# Patient Record
Sex: Male | Born: 1945 | Race: White | Hispanic: No | Marital: Married | Smoking: Former smoker
Health system: Southern US, Community
[De-identification: ages and names within clinical notes are randomized; demographics above are authoritative.]

## PROBLEM LIST (undated history)

## (undated) DIAGNOSIS — I10 Essential (primary) hypertension: Secondary | ICD-10-CM

## (undated) DIAGNOSIS — B029 Zoster without complications: Secondary | ICD-10-CM

## (undated) DIAGNOSIS — I255 Ischemic cardiomyopathy: Secondary | ICD-10-CM

## (undated) DIAGNOSIS — M7130 Other bursal cyst, unspecified site: Secondary | ICD-10-CM

## (undated) DIAGNOSIS — E782 Mixed hyperlipidemia: Secondary | ICD-10-CM

## (undated) DIAGNOSIS — Z9861 Coronary angioplasty status: Secondary | ICD-10-CM

## (undated) DIAGNOSIS — I493 Ventricular premature depolarization: Secondary | ICD-10-CM

## (undated) DIAGNOSIS — H35039 Hypertensive retinopathy, unspecified eye: Secondary | ICD-10-CM

## (undated) DIAGNOSIS — N183 Chronic kidney disease, stage 3 unspecified: Secondary | ICD-10-CM

## (undated) DIAGNOSIS — K219 Gastro-esophageal reflux disease without esophagitis: Secondary | ICD-10-CM

## (undated) DIAGNOSIS — I219 Acute myocardial infarction, unspecified: Secondary | ICD-10-CM

## (undated) DIAGNOSIS — E1129 Type 2 diabetes mellitus with other diabetic kidney complication: Secondary | ICD-10-CM

## (undated) DIAGNOSIS — Z951 Presence of aortocoronary bypass graft: Secondary | ICD-10-CM

## (undated) DIAGNOSIS — N400 Enlarged prostate without lower urinary tract symptoms: Secondary | ICD-10-CM

## (undated) DIAGNOSIS — H269 Unspecified cataract: Secondary | ICD-10-CM

## (undated) DIAGNOSIS — E785 Hyperlipidemia, unspecified: Secondary | ICD-10-CM

## (undated) DIAGNOSIS — G8929 Other chronic pain: Secondary | ICD-10-CM

## (undated) DIAGNOSIS — M549 Dorsalgia, unspecified: Secondary | ICD-10-CM

## (undated) DIAGNOSIS — E11319 Type 2 diabetes mellitus with unspecified diabetic retinopathy without macular edema: Secondary | ICD-10-CM

## (undated) DIAGNOSIS — I2510 Atherosclerotic heart disease of native coronary artery without angina pectoris: Secondary | ICD-10-CM

## (undated) DIAGNOSIS — I6522 Occlusion and stenosis of left carotid artery: Secondary | ICD-10-CM

## (undated) HISTORY — DX: Atherosclerotic heart disease of native coronary artery without angina pectoris: Z98.61

## (undated) HISTORY — DX: Hyperlipidemia, unspecified: E78.5

## (undated) HISTORY — DX: Acute myocardial infarction, unspecified: I21.9

## (undated) HISTORY — DX: Chronic kidney disease, stage 3 (moderate): N18.3

## (undated) HISTORY — DX: Hypertensive retinopathy, unspecified eye: H35.039

## (undated) HISTORY — DX: Essential (primary) hypertension: I10

## (undated) HISTORY — PX: LUMBAR FUSION: SHX111

## (undated) HISTORY — DX: Unspecified cataract: H26.9

## (undated) HISTORY — DX: Type 2 diabetes mellitus with other diabetic kidney complication: E11.29

## (undated) HISTORY — DX: Benign prostatic hyperplasia without lower urinary tract symptoms: N40.0

## (undated) HISTORY — DX: Ischemic cardiomyopathy: I25.5

## (undated) HISTORY — DX: Occlusion and stenosis of left carotid artery: I65.22

## (undated) HISTORY — DX: Type 2 diabetes mellitus with unspecified diabetic retinopathy without macular edema: E11.319

## (undated) HISTORY — DX: Chronic kidney disease, stage 3 unspecified: N18.30

## (undated) HISTORY — DX: Other chronic pain: G89.29

## (undated) HISTORY — DX: Other bursal cyst, unspecified site: M71.30

## (undated) HISTORY — DX: Atherosclerotic heart disease of native coronary artery without angina pectoris: I25.10

## (undated) HISTORY — PX: HERNIA REPAIR: SHX51

## (undated) HISTORY — DX: Gastro-esophageal reflux disease without esophagitis: K21.9

## (undated) HISTORY — DX: Dorsalgia, unspecified: M54.9

## (undated) NOTE — *Deleted (*Deleted)
Shared service with APP.  I have personally seen and examined the patient, providing direct face to face care.  Physical exam findings and plan include patient presents with tremors and shakiness.  Patient sent over for further work-up with recent medication changes, kidney disease history.  Concern for electrolyte/kidney/medication related.  Plan for blood work and symptomatic care and likely admission.  Patient has mild shaking all extremities worse in the hands bilateral, mild hyperreflexia patella bilateral.  No diagnosis found.

---

## 1968-10-01 HISTORY — PX: BACK SURGERY: SHX140

## 1992-10-01 HISTORY — PX: CORONARY ANGIOPLASTY: SHX604

## 1995-10-02 HISTORY — PX: CORONARY ANGIOPLASTY: SHX604

## 1997-10-01 HISTORY — PX: CORONARY ANGIOPLASTY WITH STENT PLACEMENT: SHX49

## 1998-08-18 ENCOUNTER — Encounter: Payer: Self-pay | Admitting: Cardiology

## 1998-08-18 ENCOUNTER — Ambulatory Visit (HOSPITAL_COMMUNITY): Admission: RE | Admit: 1998-08-18 | Discharge: 1998-08-18 | Payer: Self-pay | Admitting: Cardiology

## 1999-06-27 ENCOUNTER — Ambulatory Visit (HOSPITAL_COMMUNITY): Admission: RE | Admit: 1999-06-27 | Discharge: 1999-06-27 | Payer: Self-pay | Admitting: Gastroenterology

## 1999-10-02 HISTORY — PX: CORONARY ANGIOPLASTY WITH STENT PLACEMENT: SHX49

## 2000-06-20 ENCOUNTER — Encounter: Admission: RE | Admit: 2000-06-20 | Discharge: 2000-06-20 | Payer: Self-pay | Admitting: Cardiology

## 2000-06-20 ENCOUNTER — Encounter: Payer: Self-pay | Admitting: Cardiology

## 2000-06-24 ENCOUNTER — Ambulatory Visit (HOSPITAL_COMMUNITY): Admission: RE | Admit: 2000-06-24 | Discharge: 2000-06-25 | Payer: Self-pay | Admitting: Cardiology

## 2001-07-09 ENCOUNTER — Encounter: Payer: Self-pay | Admitting: Cardiology

## 2001-07-09 ENCOUNTER — Ambulatory Visit (HOSPITAL_COMMUNITY): Admission: RE | Admit: 2001-07-09 | Discharge: 2001-07-09 | Payer: Self-pay | Admitting: Cardiology

## 2004-10-01 HISTORY — PX: INCISION AND DRAINAGE OF WOUND: SHX1803

## 2005-01-17 ENCOUNTER — Ambulatory Visit (HOSPITAL_COMMUNITY): Admission: RE | Admit: 2005-01-17 | Discharge: 2005-01-17 | Payer: Self-pay

## 2010-10-21 ENCOUNTER — Encounter: Payer: Self-pay | Admitting: Family Medicine

## 2011-10-02 HISTORY — PX: CARDIAC CATHETERIZATION: SHX172

## 2011-10-02 HISTORY — PX: TRANSESOPHAGEAL ECHOCARDIOGRAM: SHX273

## 2011-10-04 ENCOUNTER — Encounter (INDEPENDENT_AMBULATORY_CARE_PROVIDER_SITE_OTHER): Payer: Self-pay | Admitting: Surgery

## 2011-10-04 ENCOUNTER — Ambulatory Visit (INDEPENDENT_AMBULATORY_CARE_PROVIDER_SITE_OTHER): Payer: Medicare Other | Admitting: Surgery

## 2011-10-04 VITALS — BP 158/88 | HR 64 | Temp 98.8°F | Resp 12 | Ht 68.0 in | Wt 166.0 lb

## 2011-10-04 DIAGNOSIS — R229 Localized swelling, mass and lump, unspecified: Secondary | ICD-10-CM

## 2011-10-04 DIAGNOSIS — R2230 Localized swelling, mass and lump, unspecified upper limb: Secondary | ICD-10-CM

## 2011-10-04 DIAGNOSIS — L821 Other seborrheic keratosis: Secondary | ICD-10-CM | POA: Insufficient documentation

## 2011-10-04 DIAGNOSIS — L570 Actinic keratosis: Secondary | ICD-10-CM | POA: Insufficient documentation

## 2011-10-04 NOTE — Patient Instructions (Signed)
Epidermal Cyst An epidermal cyst is sometimes called a sebaceous cyst, epidermal inclusion cyst, or infundibular cyst. These cysts usually contain a substance that looks "pasty" or "cheesy" and may have a bad smell. This substance is a protein called keratin. Epidermal cysts are usually found on the face, neck, or trunk. They may also occur in the vaginal area or other parts of the genitalia of both men and women. Epidermal cysts are usually small, painless, slow-growing bumps or lumps that move freely under the skin. It is important not to try to pop them. This may cause an infection and lead to tenderness and swelling. CAUSES  Epidermal cysts may be caused by a deep penetrating injury to the skin or a plugged hair follicle, often associated with acne. SYMPTOMS  Epidermal cysts can become inflamed and cause:  Redness.   Tenderness.   Increased temperature of the skin over the bumps or lumps.   Grayish-white, bad smelling material that drains from the bump or lump.  DIAGNOSIS  Epidermal cysts are easily diagnosed by your caregiver during an exam. Rarely, a tissue sample (biopsy) may be taken to rule out other conditions that may resemble epidermal cysts. TREATMENT   Epidermal cysts often get better and disappear on their own. They are rarely ever cancerous.   If a cyst becomes infected, it may become inflamed and tender. This may require opening and draining the cyst. Treatment with antibiotics may be necessary. When the infection is gone, the cyst may be removed with minor surgery.   Small, inflamed cysts can often be treated with antibiotics or by injecting steroid medicines.   Sometimes, epidermal cysts become large and bothersome. If this happens, surgical removal in your caregiver's office may be necessary.  HOME CARE INSTRUCTIONS  Only take over-the-counter or prescription medicines as directed by your caregiver.   Take your antibiotics as directed. Finish them even if you start to  feel better.  SEEK MEDICAL CARE IF:   Your cyst becomes tender, red, or swollen.   Your condition is not improving or is getting worse.   You have any other questions or concerns.  MAKE SURE YOU:  Understand these instructions.   Will watch your condition.   Will get help right away if you are not doing well or get worse.  Document Released: 08/18/2004 Document Revised: 05/30/2011 Document Reviewed: 03/26/2011 [facility] Patient Information 2012 [place].  You may have on your scalp an: Actinic Keratosis  Actinic keratosis is a growth on the skin that is considered to be precancerous. That means it could develop into skin cancer if it is not treated. About 1% of such growths will turn into skin cancer, therefore, it is important that the skin growth is removed. An actinic keratosis might be as small as a pinhead or as big as a quarter. They appear most often on areas of skin that get a lot of sun exposure throughout your life. These include the bald scalp, face, ears, lips, upper back and the backs of hands and forearms. An actinic keratosis usually looks like a scaly, rough spot of skin. Sometimes there might be a little tag of pink or gray skin growing off them.  CAUSES  Sun damage causes abnormal growth of skin cells. Actinic keratoses (more than one growth) are the result of sun damage. You are more likely to develop them if you:  Have light-colored skin.   Are older (actinic keratoses increases with age).   Sunburn easily.   Have spent a  lot of time in the sun.   Have had a job that involves a lot of outdoor work, such as a Geographical information systems officer.  SYMPTOMS   Blotchy, red and white patches of skin.   A skin patch that feels rough (like sandpaper), scaly or crusty.   Patches of dry, white skin on the lips.   A patch of skin that is thinner than normal.   Skin that is tender to the touch.   Although a rare symptom; an area of skin that bleeds.  DIAGNOSIS  To decide  if you have actinic keratosis, your caregiver will probably:  Ask about symptoms you have noticed.   Ask about your history of exposure to the sun.   Ask about your overall health history.   Examine the skin that concerns you. The caregiver may want to look at skin on other parts of your body that have had a lot of sun exposure.   Order a biopsy. A biopsy is the removal of a small sample of tissue from the patch of skin. It is then examined for signs of cancer.  TREATMENT  Actinic keratosis can be treated several ways. Most of the time, treatments can be done in a clinic or in your caregiver's office. Be sure to discuss the different options with your caregiver. They include:  Surgical removal (curettage). A special surgical instrument (a curette) is used to scrape the growth.   Cryosurgery. Liquid nitrogen is used to freeze the patch of skin. Often it is sprayed on the area. The growth will eventually fall off.   5-FU (5-fluorouracil) cream. The cream is applied several times a day for up to 4 weeks. The skin often becomes red and irritated, but the growths will go away. This method is often used if actinic keratoses or the skin is badly damaged.   Chemical peel. Chemicals are applied to the skin on a small spot. The outer layers of skin at that spot are then peeled off.   Photodynamic therapy. A drug (photosensitizing agent) is put on the skin before exposing the skin to a strong light. Together, the drug and strong light destroys the actinic keratoses.   Imiquimod cream. A medicine usually applied to growths on the face or scalp.  PROGNOSIS  Early treatment of actinic keratoses usually gets rid of the growths without further worries. RELATED COMPLICATIONS  Skin irritation or redness from the treatment.   Scarring where the patch of skin was removed.   Development of skin cancer. This rarely happens if the growths are removed early on.  HOME CARE INSTRUCTIONS   An adhesive  bandage, and possibly gauze, will cover the treated area.   Change and remove the bandage as directed by your caregiver.   Keep the treated area dry as directed by your caregiver.   Apply any creams that your caregiver prescribed. Follow the directions carefully.   To prevent future skin damage:   Wear sunscreen year-round, not just in the summer. The winter sun can damage skin, too.   Wear long-sleeved clothing and wide-brimmed hats.   When possible, avoid midday exposure to the sun.   If you want to look tan, try sunless tanning products (lotions and sprays). Avoid tanning beds.   Commit to regularly checking your skin for new changes.   Visit a skin doctor (dermatologist ) every year for a skin exam.  SEEK MEDICAL CARE IF:   The treated area of skin does not heal and becomes more irritated,  red or bloody.   You notice other patches of skin that are similar to the actinic keratoses that were treated.  Document Released: 12/14/2008 Document Revised: 05/30/2011 Document Reviewed: 12/14/2008 [facility] Patient Information 2012 [place].

## 2011-10-04 NOTE — Progress Notes (Signed)
Subjective:     Patient ID: Bradley Black, male   DOB: 1946/08/02, 66 y.o.   MRN: [id number]  HPI  Bradley Black  1945/10/30 [id number]  Patient Care Team: Odette Fraction, MD as PCP - General (Family Medicine)  This patient is a 66 y.o.male who presents today for surgical evaluation at the request of Dr. Dennard Schaumann.   Reason for visit: Painful lump on right arm. Also when he bowls of concern to him on head and leg  Patient is an active male who's had a lump on his right arm for several years. He does not taken is got much larger, however, it has become more painful and uncomfortable. It bothers him on a regular basis. He cannot sleep on it. Because of concerns, he wished to be evaluated to see if it could be removed. He recalls having a cyst removed in the past.  He also feels a funny mole on his left scalp and on his right lateral calf. He wonders if those need to be dealt with. He's never had a dermatology visit. No history of skin cancers. No history of burns in the past. He cannot recall the skin cancers. His father did have Dixie cancer. His mother had breast cancer.   Patient Active Problem List  Diagnoses  . Mass of right arm, probable sebaceous cyst  . Actinic keratosis, left scalp  . Seborrheic keratosis, right lateral leg    Past Medical History  Diagnosis Date  . Diabetes mellitus   . Hypertension   . Cyst     right shoulder    Past Surgical History  Procedure Date  . Back surgery 1970    History   Social History  . Marital Status: Married    Spouse Name: N/A    Number of Children: N/A  . Years of Education: N/A   Occupational History  . Not on file.   Social History Main Topics  . Smoking status: Former Research scientist (life sciences)  . Smokeless tobacco: Not on file   Comment: quit about 30 yrs ago  . Alcohol Use: No  . Drug Use: No  . Sexually Active:    Other Topics Concern  . Not on file   Social History Narrative  . No narrative on file    Family History    Problem Relation Age of Onset  . Cancer Mother     breast  . Cancer Father     bone    Current outpatient prescriptions:amLODipine-valsartan (EXFORGE) 5-320 MG per tablet, Take 1 tablet by mouth daily.  , Disp: , Rfl: ;  metFORMIN (GLUCOPHAGE) 500 MG tablet, Take 500 mg by mouth 2 (two) times daily with a meal.  , Disp: , Rfl: ;  metoprolol succinate (TOPROL-XL) 25 MG 24 hr tablet, daily., Disp: , Rfl: ;  omeprazole (PRILOSEC) 20 MG capsule, Take 20 mg by mouth daily.  , Disp: , Rfl:  Tamsulosin HCl (FLOMAX) 0.4 MG CAPS, Take 0.4 mg by mouth daily.  , Disp: , Rfl:   No Known Allergies  BP 158/88  Pulse 64  Temp(Src) 98.8 F (37.1 C) (Temporal)  Resp 12  Ht 5\' 8"  (1.727 m)  Wt 166 lb (75.297 kg)  BMI 25.24 kg/m2     Review of Systems  Constitutional: Negative for fever, chills and diaphoresis.  HENT: Negative for nosebleeds, sore throat, facial swelling, mouth sores, trouble swallowing and ear discharge.   Eyes: Negative for photophobia, discharge and visual disturbance.  Respiratory: Negative for choking,  chest tightness, shortness of breath and stridor.   Cardiovascular: Negative for chest pain and palpitations.  Gastrointestinal: Negative for nausea, vomiting, abdominal pain, diarrhea, constipation, blood in stool, abdominal distention, anal bleeding and rectal pain.  Genitourinary: Negative for dysuria, urgency, difficulty urinating and testicular pain.  Musculoskeletal: Positive for arthralgias. Negative for myalgias, back pain and gait problem.  Skin: Negative for color change, pallor and rash.  Neurological: Negative for dizziness, speech difficulty, weakness, numbness and headaches.  Hematological: Negative for adenopathy. Does not bruise/bleed easily.  Psychiatric/Behavioral: Negative for hallucinations, confusion and agitation.       Objective:   Physical Exam  Constitutional: He is oriented to person, place, and time. He appears well-developed and well-nourished.  No distress.  HENT:  Head: Normocephalic.  Mouth/Throat: Oropharynx is clear and moist. No oropharyngeal exudate.  Eyes: Conjunctivae and EOM are normal. Pupils are equal, round, and reactive to light. No scleral icterus.  Neck: Normal range of motion. Neck supple. No tracheal deviation present.  Cardiovascular: Normal rate, regular rhythm and intact distal pulses.   Pulmonary/Chest: Effort normal and breath sounds normal. No respiratory distress.  Abdominal: Soft. He exhibits no distension. There is no tenderness. Hernia confirmed negative in the right inguinal area and confirmed negative in the left inguinal area.  Musculoskeletal: Normal range of motion. He exhibits no tenderness.  Lymphadenopathy:    He has no cervical adenopathy.       Right: No inguinal adenopathy present.       Left: No inguinal adenopathy present.  Neurological: He is alert and oriented to person, place, and time. No cranial nerve deficit. He exhibits normal muscle tone. Coordination normal.  Skin: Skin is warm and dry. No rash noted. He is not diaphoretic. No erythema. No pallor.     Psychiatric: He has a normal mood and affect. His behavior is normal. Judgment and thought content normal.       Assessment:     Right arm mass, prob seb cyst.  Prob benign skin masses on scalp & leg    Plan:     The right arm mass can be removed under local and a minor or room. We'll set up a time later. I suspect that the 2 skin lesions of concern to him require dermatology evaluation. Perhaps he just need to freezing of the scalp actinic keratosis. The right lateral leg mass can probably be observed vs punch skin biopsy. I think a dermatology evaluation will be helpful in avoiding overtreatment but ignoring suspicious lesions  The pathophysiology of skin & subcutaneous masses was discussed.  Natural history risks without surgery were discussed.  I recommended surgery to remove the mass.  I explained the technique of removal with  use of local anesthesia & possible need for more aggressive sedation/anesthesia for patient comfort.    Risks such as bleeding, infection, heart attack, death, and other risks were discussed.  I noted a good likelihood this will help address the problem.   Possibility that this will not correct all symptoms was explained. Possibility of regrowth/recurrence of the mass was discussed.  We will work to minimize complications. Questions were answered.  The patient expresses understanding & wishes to proceed with surgery.

## 2011-10-24 ENCOUNTER — Ambulatory Visit (INDEPENDENT_AMBULATORY_CARE_PROVIDER_SITE_OTHER): Payer: Medicare Other | Admitting: Surgery

## 2011-10-24 ENCOUNTER — Encounter (INDEPENDENT_AMBULATORY_CARE_PROVIDER_SITE_OTHER): Payer: Self-pay | Admitting: Surgery

## 2011-10-24 VITALS — BP 168/86 | HR 76 | Temp 97.8°F | Resp 18 | Ht 68.0 in | Wt 163.4 lb

## 2011-10-24 DIAGNOSIS — R2230 Localized swelling, mass and lump, unspecified upper limb: Secondary | ICD-10-CM

## 2011-10-24 DIAGNOSIS — L723 Sebaceous cyst: Secondary | ICD-10-CM

## 2011-10-24 HISTORY — PX: MASS EXCISION: SHX2000

## 2011-10-24 NOTE — Patient Instructions (Signed)
GENERAL SURGERY: POST OP INSTRUCTIONS  1. DIET: Follow a light bland diet the first 24 hours after arrival home, such as soup, liquids, crackers, etc.  Be sure to include lots of fluids daily.  Avoid fast food or heavy meals as your are more likely to get nauseated.   2. Take your usually prescribed home medications unless otherwise directed. 3. PAIN CONTROL: a. Pain is best controlled by a usual combination of three different methods TOGETHER: i. Ice/Heat ii. Over the counter pain medication b. Most patients will experience some swelling and bruising around the incisions.  Ice packs or heating pads (30-60 minutes up to 6 times a day) will help. Use ice for the first few days to help decrease swelling and bruising, then switch to heat to help relax tight/sore spots and speed recovery.  Some people prefer to use ice alone, heat alone, alternating between ice & heat.  Experiment to what works for you.  Swelling and bruising can take several weeks to resolve.   c. It is helpful to take an over-the-counter pain medication regularly for the first few weeks.  Choose one of the following that works best for you: i. Naproxen (Aleve, etc)  Two 220mg  tabs twice a day ii. Ibuprofen (Advil, etc) Three 200mg  tabs four times a day (every meal & bedtime) iii. Acetaminophen (Tylenol, etc) 500-650mg  four times a day (every meal & bedtime) 4. Avoid getting constipated.  Between the surgery and the pain medications, it is common to experience some constipation.  Increasing fluid intake and taking a fiber supplement (such as Metamucil, Citrucel, FiberCon, MiraLax, etc) 1-2 times a day regularly will usually help prevent this problem from occurring.  A mild laxative (prune juice, Milk of Magnesia, MiraLax, etc) should be taken according to package directions if there are no bowel movements after 48 hours.   5. Wash / shower every day.  You may shower over the dressings as they are waterproof.  Continue to shower over  incision(s) after the dressing is off. 6. Remove your bandages 1st day after surgery.  You may leave the incision open to air.  You have skin tapes (Steri Strips) covering the incision(s).  Leave them on until one week, then remove.  You may replace a dressing/Band-Aid to cover the incision for comfort if you wish.    7. ACTIVITIES as tolerated:   a. You may resume regular (light) daily activities beginning the next day-such as daily self-care, walking, climbing stairs-gradually increasing activities as tolerated.  If you can walk 30 minutes without difficulty, it is safe to try more intense activity such as jogging, treadmill, bicycling, low-impact aerobics, swimming, etc. b. Save the most intensive and strenuous activity for last such as sit-ups, heavy lifting, contact sports, etc  Refrain from any heavy lifting or straining until you are off narcotics for pain control.   c. DO NOT PUSH THROUGH PAIN.  Let pain be your guide: If it hurts to do something, don't do it.  Pain is your body warning you to avoid that activity for another week until the pain goes down. d. You may drive when you are no longer taking prescription pain medication, you can comfortably wear a seatbelt, and you can safely maneuver your car and apply brakes. e. Dennis Bast may have sexual intercourse when it is comfortable.  8. FOLLOW UP in our office a. Please call [facility] at [phone] to set up an appointment to see your surgeon in the office for a follow-up appointment approximately  2-3 weeks after your surgery. b. Make sure that you call for this appointment the day you arrive home to insure a convenient appointment time. 9. IF YOU HAVE DISABILITY OR FAMILY LEAVE FORMS, BRING THEM TO THE OFFICE FOR PROCESSING.  DO NOT GIVE THEM TO YOUR DOCTOR.   WHEN TO CALL us [phone]: 1. Poor pain control 2. Reactions / problems with new medications (rash/itching, nausea, etc)  3. Fever over 101.5 F (38.5 C) 4. Worsening swelling or  bruising 5. Continued bleeding from incision. 6. Increased pain, redness, or drainage from the incision   The clinic staff is available to answer your questions during regular business hours (8:30am-5pm).  Please don't hesitate to call and ask to speak to one of our nurses for clinical concerns.   If you have a medical emergency, go to the nearest emergency room or call 911.  A surgeon from [facility] is always on call at the [facility], [place], [place], [place], [place]  [address] ? MAIN: [phone] ? TOLL FREE: [phone] ?  FAX [phone] [email or web address]

## 2011-10-24 NOTE — Progress Notes (Signed)
Subjective:     Patient ID: Dimitri Mohammed Kindle, male   DOB: 1945-11-07, 66 y.o.   MRN: [id number]  HPI  Caige MIGUELANGEL TRAY  10/14/1945 [id number]  Patient Care Team: Odette Fraction, MD as PCP - General (Family Medicine)  This patient is a 66 y.o.male who presents today for surgical removal of right arm mass.  No problems with infection / inflammation    Patient Active Problem List  Diagnoses  . Mass of right arm, probable sebaceous cyst  . Actinic keratosis, left scalp  . Seborrheic keratosis, right lateral leg    Past Medical History  Diagnosis Date  . Diabetes mellitus   . Hypertension   . Cyst     right shoulder    Past Surgical History  Procedure Date  . Back surgery 1970    History   Social History  . Marital Status: Married    Spouse Name: N/A    Number of Children: N/A  . Years of Education: N/A   Occupational History  . Not on file.   Social History Main Topics  . Smoking status: Former Research scientist (life sciences)  . Smokeless tobacco: Never Used   Comment: quit about 30 yrs ago  . Alcohol Use: No  . Drug Use: No  . Sexually Active: Not on file   Other Topics Concern  . Not on file   Social History Narrative  . No narrative on file    Family History  Problem Relation Age of Onset  . Cancer Mother     breast  . Cancer Father     bone    Current outpatient prescriptions:amLODipine-valsartan (EXFORGE) 5-320 MG per tablet, Take 1 tablet by mouth daily.  , Disp: , Rfl: ;  doxazosin (CARDURA) 4 MG tablet, , Disp: , Rfl: ;  metFORMIN (GLUCOPHAGE) 500 MG tablet, Take 500 mg by mouth 2 (two) times daily with a meal.  , Disp: , Rfl: ;  metoprolol succinate (TOPROL-XL) 25 MG 24 hr tablet, daily., Disp: , Rfl: ;  omeprazole (PRILOSEC) 20 MG capsule, Take 20 mg by mouth daily.  , Disp: , Rfl:  pravastatin (PRAVACHOL) 20 MG tablet, , Disp: , Rfl: ;  Tamsulosin HCl (FLOMAX) 0.4 MG CAPS, Take 0.4 mg by mouth daily.  , Disp: , Rfl:   No Known Allergies  BP 168/86  Pulse 76   Temp(Src) 97.8 F (36.6 C) (Temporal)  Resp 18  Ht 5\' 8"  (1.727 m)  Wt 163 lb 6.4 oz (74.118 kg)  BMI 24.84 kg/m2     Review of Systems  Constitutional: Negative for fever, chills and diaphoresis.  HENT: Negative for sore throat, trouble swallowing and neck pain.   Eyes: Negative for photophobia and visual disturbance.  Respiratory: Negative for choking and shortness of breath.   Cardiovascular: Negative for chest pain and palpitations.  Gastrointestinal: Negative for nausea, vomiting, abdominal distention, anal bleeding and rectal pain.  Genitourinary: Negative for dysuria, urgency, difficulty urinating and testicular pain.  Musculoskeletal: Negative for myalgias, arthralgias and gait problem.  Skin: Negative for color change and rash.  Neurological: Negative for dizziness, speech difficulty, weakness and numbness.  Hematological: Negative for adenopathy.  Psychiatric/Behavioral: Negative for hallucinations, confusion and agitation.  All other systems reviewed and are negative.       Objective:   Physical Exam  Constitutional: He is oriented to person, place, and time. He appears well-developed and well-nourished. No distress.  HENT:  Head: Normocephalic.  Mouth/Throat: Oropharynx is clear and moist. No oropharyngeal exudate.  Eyes: Conjunctivae and EOM are normal. Pupils are equal, round, and reactive to light. No scleral icterus.  Neck: Normal range of motion. Neck supple. No tracheal deviation present.  Cardiovascular: Normal rate, regular rhythm, normal heart sounds and intact distal pulses.   Pulmonary/Chest: Effort normal and breath sounds normal. No respiratory distress.  Abdominal: Soft. He exhibits no distension. There is no tenderness. Hernia confirmed negative in the right inguinal area and confirmed negative in the left inguinal area.  Musculoskeletal: Normal range of motion. He exhibits no tenderness.  Lymphadenopathy:    He has no cervical adenopathy.        Right: No inguinal adenopathy present.       Left: No inguinal adenopathy present.  Neurological: He is alert and oriented to person, place, and time. No cranial nerve deficit. He exhibits normal muscle tone. Coordination normal.  Skin: Skin is warm and dry. No rash noted. He is not diaphoretic. No erythema. No pallor.     Psychiatric: He has a normal mood and affect. His behavior is normal. Judgment and thought content normal.       Assessment:     R arm mass, seb cyst    Plan:     The pathophysiology of subcutaneous masses and differential diagnosis was discussed.   I recommended removal of the mass.  Technique, risks, benefits, alternatives discussed.  The patient expressed understanding & wished to proceed.  I placed a field block with local anaesthetic.  I incised the skin over the mass in the upper outer right arm obliquely along tension lines to remove the 2x3cm cyst.  It was well circumscribed & had cheesy material within it on post-removal inspection.  Because it was an obvious cyst, I did not send it to pathology.  I closed the wound with running absorbable suture & steristrip reinforcement.  Sterile gauze & dressing was applied.    The patient tolerated the procedure.  We will have the patient return to clinic for close follow up to make sure the incision heals.

## 2011-11-12 ENCOUNTER — Telehealth (INDEPENDENT_AMBULATORY_CARE_PROVIDER_SITE_OTHER): Payer: Self-pay

## 2011-11-12 NOTE — Telephone Encounter (Signed)
LMOM stating that we had to cancel the appt for 11-13-11 and we r/s the appt with Dr Johney Maine for 12-11-11 arrive at 10:30am for an 10:45 appt.

## 2011-11-13 ENCOUNTER — Encounter (INDEPENDENT_AMBULATORY_CARE_PROVIDER_SITE_OTHER): Payer: Medicare Other | Admitting: Surgery

## 2011-11-30 DIAGNOSIS — I2510 Atherosclerotic heart disease of native coronary artery without angina pectoris: Secondary | ICD-10-CM | POA: Insufficient documentation

## 2011-11-30 DIAGNOSIS — I255 Ischemic cardiomyopathy: Secondary | ICD-10-CM

## 2011-11-30 HISTORY — DX: Ischemic cardiomyopathy: I25.5

## 2011-12-11 ENCOUNTER — Ambulatory Visit (INDEPENDENT_AMBULATORY_CARE_PROVIDER_SITE_OTHER): Payer: Medicare Other | Admitting: Surgery

## 2011-12-11 ENCOUNTER — Ambulatory Visit
Admission: RE | Admit: 2011-12-11 | Discharge: 2011-12-11 | Disposition: A | Discharge status: Home / Self Care | Payer: Medicare Other | Source: Ambulatory Visit | Attending: Cardiology | Admitting: Cardiology

## 2011-12-11 ENCOUNTER — Encounter (INDEPENDENT_AMBULATORY_CARE_PROVIDER_SITE_OTHER): Payer: Self-pay | Admitting: Surgery

## 2011-12-11 ENCOUNTER — Other Ambulatory Visit: Payer: Self-pay | Admitting: Cardiology

## 2011-12-11 ENCOUNTER — Encounter (HOSPITAL_COMMUNITY): Payer: Self-pay | Admitting: Pharmacy Technician

## 2011-12-11 VITALS — BP 152/98 | HR 68 | Temp 97.8°F | Resp 16 | Ht 68.0 in | Wt 165.2 lb

## 2011-12-11 DIAGNOSIS — R229 Localized swelling, mass and lump, unspecified: Secondary | ICD-10-CM

## 2011-12-11 DIAGNOSIS — I2510 Atherosclerotic heart disease of native coronary artery without angina pectoris: Secondary | ICD-10-CM

## 2011-12-11 DIAGNOSIS — R2230 Localized swelling, mass and lump, unspecified upper limb: Secondary | ICD-10-CM

## 2011-12-11 NOTE — Progress Notes (Signed)
Subjective:     Patient ID: Bradley Black, male   DOB: [birth year], 66 y.o.   MRN: [id number]  HPI  Bradley Black  1946/02/02 [id number]  Patient Care Team: Susy Frizzle, MD as PCP - General (Family Medicine)  This patient is a 66 y.o.male who presents today for surgical evaluation  Procedure: Removal of mass in right arm consistent with sebaceous cyst  The patient comes in today feeling fine.  He had a little swelling but that has faded away. Using his arm well. No drainage. No fevers chills or sweats  Patient Active Problem List  Diagnoses  . Actinic keratosis, left scalp  . Seborrheic keratosis, right lateral leg    Past Medical History  Diagnosis Date  . Diabetes mellitus   . Hypertension   . Cyst     right shoulder    Past Surgical History  Procedure Date  . Back surgery 1970  . Mass excision 10/24/11    R arm    History   Social History  . Marital Status: Married    Spouse Name: N/A    Number of Children: N/A  . Years of Education: N/A   Occupational History  . Not on file.   Social History Main Topics  . Smoking status: Former Research scientist (life sciences)  . Smokeless tobacco: Never Used   Comment: quit about 30 yrs ago  . Alcohol Use: No  . Drug Use: No  . Sexually Active: Not on file   Other Topics Concern  . Not on file   Social History Narrative  . No narrative on file    Family History  Problem Relation Age of Onset  . Cancer Mother     breast  . Cancer Father     bone    Current outpatient prescriptions:amLODipine-valsartan (EXFORGE) 5-320 MG per tablet, Take 1 tablet by mouth daily.  , Disp: , Rfl: ;  doxazosin (CARDURA) 4 MG tablet, , Disp: , Rfl: ;  glyBURIDE-metformin (GLUCOVANCE) 5-500 MG per tablet, BID times 48H., Disp: , Rfl: ;  metFORMIN (GLUCOPHAGE) 500 MG tablet, Take 500 mg by mouth 2 (two) times daily with a meal.  , Disp: , Rfl:  metoprolol succinate (TOPROL-XL) 25 MG 24 hr tablet, daily., Disp: , Rfl: ;  omeprazole (PRILOSEC) 20 MG  capsule, Take 20 mg by mouth daily.  , Disp: , Rfl: ;  pravastatin (PRAVACHOL) 20 MG tablet, , Disp: , Rfl: ;  Tamsulosin HCl (FLOMAX) 0.4 MG CAPS, Take 0.4 mg by mouth daily.  , Disp: , Rfl:   No Known Allergies  BP 152/98  Pulse 68  Temp(Src) 97.8 F (36.6 C) (Temporal)  Resp 16  Ht 5\' 8"  (1.727 m)  Wt 165 lb 3.2 oz (74.934 kg)  BMI 25.12 kg/m2     Review of Systems  Constitutional: Negative for fever, chills and diaphoresis.  HENT: Negative for sore throat, trouble swallowing and neck pain.   Eyes: Negative for photophobia and visual disturbance.  Respiratory: Negative for choking and shortness of breath.   Cardiovascular: Negative for chest pain and palpitations.  Gastrointestinal: Negative for nausea, vomiting, abdominal distention, anal bleeding and rectal pain.  Genitourinary: Negative for dysuria, urgency, difficulty urinating and testicular pain.  Musculoskeletal: Negative for myalgias, arthralgias and gait problem.  Skin: Negative for color change and rash.  Neurological: Negative for dizziness, speech difficulty, weakness and numbness.  Hematological: Negative for adenopathy.  Psychiatric/Behavioral: Negative for hallucinations, confusion and agitation.       Objective:  Physical Exam  Constitutional: He is oriented to person, place, and time. He appears well-developed and well-nourished. No distress.  HENT:  Head: Normocephalic.  Mouth/Throat: Oropharynx is clear and moist. No oropharyngeal exudate.  Eyes: Conjunctivae and EOM are normal. Pupils are equal, round, and reactive to light. No scleral icterus.  Neck: Normal range of motion. No tracheal deviation present.  Cardiovascular: Normal rate, normal heart sounds and intact distal pulses.   Pulmonary/Chest: Effort normal. No respiratory distress.  Abdominal: Soft. He exhibits no distension. There is no tenderness. Hernia confirmed negative in the right inguinal area and confirmed negative in the left inguinal  area.       Incisions clean with normal healing ridges.  No hernias  Musculoskeletal: Normal range of motion. He exhibits no tenderness.       Arms: Neurological: He is alert and oriented to person, place, and time. No cranial nerve deficit. He exhibits normal muscle tone. Coordination normal.  Skin: Skin is warm and dry. No rash noted. He is not diaphoretic.  Psychiatric: He has a normal mood and affect. His behavior is normal.       Assessment:     S/p removal of seb cyst, healed    Plan:     Increase activity as tolerated.  Do not push through pain.  Advanced on diet as tolerated. Bowel regimen to avoid problems.  Return to clinic p.r.n. The patient expressed understanding and appreciation

## 2011-12-13 ENCOUNTER — Ambulatory Visit (HOSPITAL_COMMUNITY)
Admission: RE | Admit: 2011-12-13 | Discharge: 2011-12-13 | Disposition: A | Discharge status: Home / Self Care | Payer: Medicare Other | Source: Ambulatory Visit | Attending: Cardiology | Admitting: Cardiology

## 2011-12-13 ENCOUNTER — Encounter (HOSPITAL_COMMUNITY)
Admission: RE | Disposition: A | Discharge status: Home / Self Care | Payer: Self-pay | Source: Ambulatory Visit | Attending: Cardiology

## 2011-12-13 DIAGNOSIS — Z9861 Coronary angioplasty status: Secondary | ICD-10-CM | POA: Insufficient documentation

## 2011-12-13 DIAGNOSIS — E119 Type 2 diabetes mellitus without complications: Secondary | ICD-10-CM | POA: Insufficient documentation

## 2011-12-13 DIAGNOSIS — I252 Old myocardial infarction: Secondary | ICD-10-CM | POA: Insufficient documentation

## 2011-12-13 DIAGNOSIS — I10 Essential (primary) hypertension: Secondary | ICD-10-CM | POA: Insufficient documentation

## 2011-12-13 DIAGNOSIS — E785 Hyperlipidemia, unspecified: Secondary | ICD-10-CM | POA: Insufficient documentation

## 2011-12-13 DIAGNOSIS — I2510 Atherosclerotic heart disease of native coronary artery without angina pectoris: Secondary | ICD-10-CM | POA: Insufficient documentation

## 2011-12-13 DIAGNOSIS — I200 Unstable angina: Secondary | ICD-10-CM | POA: Insufficient documentation

## 2011-12-13 HISTORY — PX: LEFT HEART CATHETERIZATION WITH CORONARY ANGIOGRAM: SHX5451

## 2011-12-13 LAB — GLUCOSE, CAPILLARY: Glucose-Capillary: 110 mg/dL — ABNORMAL HIGH (ref 70–99)

## 2011-12-13 SURGERY — LEFT HEART CATHETERIZATION WITH CORONARY ANGIOGRAM
Anesthesia: LOCAL

## 2011-12-13 MED ORDER — SODIUM CHLORIDE 0.9 % IV SOLN
INTRAVENOUS | Status: DC
  Administered 2011-12-13: 09:00:00 via INTRAVENOUS

## 2011-12-13 MED ORDER — MORPHINE SULFATE 4 MG/ML IJ SOLN: 2.0000 mg | INTRAMUSCULAR | Status: DC | PRN

## 2011-12-13 MED ORDER — VERAPAMIL HCL 2.5 MG/ML IV SOLN
INTRAVENOUS | Status: AC
  Filled 2011-12-13: qty 2

## 2011-12-13 MED ORDER — PANTOPRAZOLE SODIUM 40 MG PO TBEC: 40.0000 mg | DELAYED_RELEASE_TABLET | Freq: Every day | ORAL | Status: DC

## 2011-12-13 MED ORDER — LIDOCAINE HCL (PF) 1 % IJ SOLN
INTRAMUSCULAR | Status: AC
  Filled 2011-12-13: qty 30

## 2011-12-13 MED ORDER — HEPARIN SODIUM (PORCINE) 1000 UNIT/ML IJ SOLN
INTRAMUSCULAR | Status: AC
  Filled 2011-12-13: qty 1

## 2011-12-13 MED ORDER — SODIUM CHLORIDE 0.9 % IJ SOLN
3.0000 mL | INTRAMUSCULAR | Status: DC | PRN
  Administered 2011-12-13: 3 mL via INTRAVENOUS

## 2011-12-13 MED ORDER — MIDAZOLAM HCL 2 MG/2ML IJ SOLN
INTRAMUSCULAR | Status: AC
  Filled 2011-12-13: qty 2

## 2011-12-13 MED ORDER — GLYBURIDE-METFORMIN 5-500 MG PO TABS: 1.0000 | ORAL_TABLET | Freq: Two times a day (BID) | ORAL | Status: DC

## 2011-12-13 MED ORDER — HYDRALAZINE HCL 50 MG PO TABS: 50.0000 mg | ORAL_TABLET | Freq: Two times a day (BID) | ORAL | Status: DC

## 2011-12-13 MED ORDER — FENTANYL CITRATE 0.05 MG/ML IJ SOLN
INTRAMUSCULAR | Status: AC
  Filled 2011-12-13: qty 2

## 2011-12-13 MED ORDER — HEPARIN (PORCINE) IN NACL 2-0.9 UNIT/ML-% IJ SOLN
INTRAMUSCULAR | Status: AC
  Filled 2011-12-13: qty 2000

## 2011-12-13 MED ORDER — ISOSORBIDE MONONITRATE ER 30 MG PO TB24: 30.0000 mg | ORAL_TABLET | Freq: Every day | ORAL | Status: DC

## 2011-12-13 MED ORDER — DIAZEPAM 5 MG PO TABS
5.0000 mg | ORAL_TABLET | ORAL | Status: AC
  Administered 2011-12-13: 5 mg via ORAL

## 2011-12-13 MED ORDER — DIAZEPAM 5 MG PO TABS
ORAL_TABLET | ORAL | Status: AC
  Filled 2011-12-13: qty 1

## 2011-12-13 MED ORDER — METFORMIN HCL 500 MG PO TABS: 500.0000 mg | ORAL_TABLET | Freq: Two times a day (BID) | ORAL | Status: DC

## 2011-12-13 MED ORDER — ACETAMINOPHEN 325 MG PO TABS: 650.0000 mg | ORAL_TABLET | ORAL | Status: DC | PRN

## 2011-12-13 MED ORDER — SODIUM CHLORIDE 0.9 % IV SOLN: 1.0000 mL/kg/h | INTRAVENOUS | Status: DC

## 2011-12-13 MED ORDER — METOPROLOL SUCCINATE ER 25 MG PO TB24: 25.0000 mg | ORAL_TABLET | Freq: Every day | ORAL | Status: DC

## 2011-12-13 MED ORDER — ONDANSETRON HCL 4 MG/2ML IJ SOLN: 4.0000 mg | Freq: Four times a day (QID) | INTRAMUSCULAR | Status: DC | PRN

## 2011-12-13 NOTE — Brief Op Note (Signed)
12/13/2011  2:32 PM  PATIENT:  Bradley Black  66 y.o. male referred for cardiac catheterization for exertional angina.  Multivessel disease -- recommend CT Surgical consultation.  Leonie Man, M.D., M.S. [facility] [address] [place], [place]  [address]  [phone]  12/13/2011 11:15 PM

## 2011-12-13 NOTE — H&P (Signed)
History and Physical Interval Note:  NAME:  Bradley Black   MRN: [id number] DOB:  10-Feb-1946   ADMIT DATE: 12/13/2011   12/13/2011 12:58 PM  Bradley Black is a 66 y.o. male with history of known coronary artery disease. His first cardiac event was in 1988, he had PTCA of the OM in 1994 as well as the LAD and 97. Stent placement to the RCA in 1999 and again in 2001. His most recent nuclear study was done in 2009 showing ejection of 47% and negative for ischemia with inferior scar. He has done well the last 2 months it began having exertional angina he thinks is paced atwith cutting grass or walking up steps he will have pressures just to rest is now associated with a slight acceleration his heart rate and at least of breathlessness as well as the chest discomfort. No symptoms at rest the images seen by Dr. [facility] on 12/11/2011 with these symptoms. Due to his history of hypertension dyslipidemia, diabetes mellitus and known coronary artery disease, Dr. Rex Kras felt the symptoms persist consistent with crescendo angina. He is therefore referred for diagnostic cardiac catheter and possible intervention.   Past Medical History  Diagnosis Date  . Diabetes mellitus   . Hypertension   . Cyst     right shoulder   Coronary Disease status post MI 1988, PTCA of OM and 94, PTCA of LAD in 1987, stent to RCA in 1990 06/12/2000.  Dyslipidemia on statin  Past Surgical History  Procedure Date  . Back surgery 1970  . Mass excision 10/24/11    R arm    FAMHx: Family History  Problem Relation Age of Onset  . Cancer Mother     breast  . Cancer Father     bone   SOCHx:  reports that he has quit smoking. He has never used smokeless tobacco. He reports that he does not drink alcohol or use illicit drugs.  ALLERGIES: No Known Allergies  HOME MEDICATIONS: Prescriptions prior to admission  Medication Sig Dispense Refill  . amLODipine-valsartan (EXFORGE) 10-320 MG per  tablet Take 1 tablet by mouth daily.      [name] aspirin EC 81 MG tablet Take 81 mg by mouth daily.      [name] doxazosin (CARDURA) 4 MG tablet Take 4 mg by mouth daily.       [name] glyBURIDE-metformin (GLUCOVANCE) 5-500 MG per tablet Take 1 tablet by mouth 2 (two) times daily with a meal.       . hydrALAZINE (APRESOLINE) 50 MG tablet Take 50 mg by mouth 2 (two) times daily.      . metFORMIN (GLUCOPHAGE) 500 MG tablet Take 500 mg by mouth 2 (two) times daily with a meal.       . metoprolol succinate (TOPROL-XL) 25 MG 24 hr tablet Take 25 mg by mouth daily.       . naproxen sodium (ANAPROX) 220 MG tablet Take 220 mg by mouth daily.       [name] omeprazole (PRILOSEC) 40 MG capsule Take 40 mg by mouth daily.      . pravastatin (PRAVACHOL) 20 MG tablet Take 20 mg by mouth daily.         PHYSICAL EXAM:Pulse 62, temperature 96.4 F (35.8 C), temperature source Oral, resp. rate 18, height 5\' 8"  (1.727 m), weight 74.844 kg (165 lb), SpO2 100.00%. General appearance: alert, cooperative and no distress Neck: no adenopathy, no JVD, supple, symmetrical, trachea midline, thyroid not enlarged,  symmetric, no tenderness/mass/nodules and bilaterat carotid bruits Lungs: clear to auscultation bilaterally and normal percussion bilaterally Heart: regular rate and rhythm, S1, S2 normal, no murmur, click, rub or gallop Abdomen: soft, non-tender; bowel sounds normal; no masses,  no organomegaly Extremities: extremities normal, atraumatic, no cyanosis or edema Pulses: 2+ and symmetric Neurologic: Grossly normal  IMPRESSION & PLAN The patients' history has been reviewed, patient examined, no change in status from most recent note, stable for surgery. I have reviewed the patients' chart and labs. Questions were answered to the patient's satisfaction.   Risks / Complications include, but not limited to: Death, MI, CVA/TIA, VF/VT (with defibrillation), Bradycardia (need for temporary pacer placement), contrast induced nephropathy,  bleeding / bruising / hematoma / pseudoaneurysm, vascular or coronary injury (with possible emergent CT or Vascular Surgery), adverse medication reactions, infection.    The patient and family voice understanding and agree to proceed.   I have signed the consent form and placed it on the chart for patient signature and RN witness.    Bradley Black has presented today for surgery, with the diagnosis of chest pain The various methods of treatment have been discussed with the patient and family. After consideration of risks, benefits and other options for treatment, the patient has consented to Procedure(s):  LEFT HEART CATHETERIZATION AND CORONARY ANGIOGRAPHY +/- AD [place]  as a surgical intervention.   We will proceed with the planned procedure.   [address] [place], [place]  [address]  [phone]  12/13/2011 12:58 PM

## 2011-12-13 NOTE — Discharge Instructions (Signed)
Radial Site Care Refer to this sheet in the next few weeks. These instructions provide you with information on caring for yourself after your procedure. Your caregiver may also give you more specific instructions. Your treatment has been planned according to current medical practices, but problems sometimes occur. Call your caregiver if you have any problems or questions after your procedure. HOME CARE INSTRUCTIONS  You may shower the day after the procedure.Remove the bandage (dressing) and gently wash the site with plain soap and water.Gently pat the site dry.   Do not apply powder or lotion to the site.   Do not submerge the affected site in water for 3 to 5 days.   Inspect the site at least twice daily.   Do not flex or bend the affected arm for 24 hours.   No lifting over 5 pounds (2.3 kg) for 5 days after your procedure.   Do not drive home if you are discharged the same day of the procedure. Have someone else drive you.   You may drive 24 hours after the procedure unless otherwise instructed by your caregiver.   Do not operate machinery or power tools for 24 hours.   A responsible adult should be with you for the first 24 hours after you arrive home.  What to expect:  Any bruising will usually fade within 1 to 2 weeks.   Blood that collects in the tissue (hematoma) may be painful to the touch. It should usually decrease in size and tenderness within 1 to 2 weeks.  SEEK IMMEDIATE MEDICAL CARE IF:  You have unusual pain at the radial site.   You have redness, warmth, swelling, or pain at the radial site.   You have drainage (other than a small amount of blood on the dressing).   You have chills.   You have a fever or persistent symptoms for more than 72 hours.   You have a fever and your symptoms suddenly get worse.   Your arm becomes pale, cool, tingly, or numb.   You have heavy bleeding from the site. Hold pressure on the site.  Document Released: 10/20/2010  Document Revised: 09/06/2011 Document Reviewed: 10/20/2010 [employer] Patient Information 2012 [employer]. 

## 2011-12-13 NOTE — CV Procedure (Addendum)
[facility]     CARDIAC CATHETERIZATION REPORT  NAME: Bradley Black   MRN: [id number] DOB: 28-Apr-1946   ADMIT DATE:  12/13/2011  Performing Cardiologist: Leonie Man , MD, MS Primary Physician: Odette Fraction, MD, MD Primary Cardiologist:  Chase Picket, MD  Procedures Performed:  Left Heart Catheterization via 5 Fr Right Radial Artery access  Left Ventriculography, RAO 11 ml/sec for 33 ml total contrast  Native Coronary Angiography  Indication(s): Exertional Angina  History of MI - PTCA of LAD & LCx-OM, PCI of RCA  Diabetes  HTN  Hyperlipidemia  History: 66 y.o. male with a known history of CAD beginning with an MI in 63.  He has hd PTCA of of the OM in 1994 as well as the LAD and 97. Stent placement to the RCA in 1999 and again in 2001. His most recent nuclear study was done in 2009 showing ejection of 47% and negative for ischemia with inferior scar.  He has done well the last 2 months it began having exertional angina he thinks is paced atwith cutting grass or walking up steps he will have pressures just to rest is now associated with a slight acceleration his heart rate and at least of breathlessness as well as the chest discomfort. No symptoms at rest the images seen by Dr. [facility] on 12/11/2011 with these symptoms. Due to his history of hypertension dyslipidemia, diabetes mellitus and known coronary artery disease, Dr. Rex Kras felt the symptoms persist consistent with crescendo angina. He is therefore referred for diagnostic cardiac catheter and possible intervention.  Consent: The procedure with Risks/Benefits/Alternatives and Indications was reviewed with the patient and family.  All questions were answered.    Risks / Complications include, but not limited to: Death, MI, CVA/TIA, VF/VT (with defibrillation), Bradycardia (need for temporary pacer placement), contrast induced nephropathy, bleeding / bruising  / hematoma / pseudoaneurysm, vascular or coronary injury (with possible emergent CT or Vascular Surgery), adverse medication reactions, infection.    The patient and family voice understanding and agree to proceed.    Consent for signed by MD and patient with RN witness -- placed on chart.  Procedure: The patient was brought to the 2nd [facility]athe[facility] Lab in the fasting state and prepped and draped in the usual sterile fashion for Right groin or radial access.  A modified Allen's test with plethysmography was performed on the right wrist demonstrating adequate Ulnar Artery collateral flow.    Sterile technique was used including antiseptics, cap, gloves, gown, hand hygiene, mask and sheet.  Skin prep: Chlorhexidine;  Time Out: Verified patient identification, verified procedure, site/side was marked, verified correct patient position, special equipment/implants available, medications/allergies/relevent history reviewed, required imaging and test results available.  Performed  The right wrist was anesthetized with 1% subcutaneous Lidocaine.  The right radial artery was accessed using the Seldinger Technique with placement of a 5 Fr Glide Sheath. The sheath was aspirated and flushed.  Then a total of 10 ml of standard Radial Artery Cocktail (see medications) was infused.  Radial Cocktail: 5 mg Verapamil, 400 mcg NTG, 2 ml 2% Lidocaine  A 5 Fr TIG 4.0 Catheter was advanced of over a Versicore wire into the ascending Aorta and used to engage first the Left then Right Coronary Arteries.  Multiple cineangiographic views of both coronary artery systems were performed.  Intracoronary NTG 200 mcg was administered prior to LCA cineangiography.  This catheter was then exchanged over the  Long Exchange Safety J wire for an angled Pigtail catheter that was advanced across the Aortic Valve.  LV hemodynamics were measured, and Left Ventriculography was performed.  LV hemodynamics were  then re-sampled, and the catheter was pulled back across the Aortic Valve for measurement of "pull-back" gradient.  The catheter and wire removed completely out of the body.  The sheath was removed in the Cath Lab with a TR band placed at 14 ml Air at 1400.  Reverse Allen's test revealed non-occlusive hemostasis.  The patient was transported to the PACU in a hemodynamically stable, chest pain free condition.   The patient was stable before, during and following the procedure.   Patient tolerated the procedure well. There were no complications.  EBL: < 10 ml  Medications:  Premedication: 5 mg  Valium  Sedation:  2 mg IV Versed, 50 mcg IV Fentanyl  Contrast:  110 ml Omnipaque  Radial Cocktail: 5 mg Verapamil, 400 mcg NTG, 2 ml 2% Lidocaine  IV Heparin: 4000 Units  Hemodynamics:  Central Aortic Pressure / Mean Aortic Pressure: 109/61 mmHg, 82 mmHg  LV Pressure / LV End diastolic Pressure:  [id number] mHg, 15 mmHg  Left Ventriculography:  EF:  45-50%  Wall Motion: Mild Inferobasal hypokinesis  Coronary Angiographic Data:  Left Main:  Moderate to large caliber, bifurcates to LAD & Left Circumflex  Left Anterior Descending (LAD):  Moderate caliber vessel with an early mid-vessel focal 70-80% eccentric lesion involving the ostium of 2 proximal small caliber Diagonal branches that each have ~80-90% ostial lesions.  3rd diagonal (D3):  Small to moderate caliber, minimal luminal irregularities  Distal LAD - there is a long, tubular 70% stenosis  Circumflex (LCx):  Moderate caliber vessel, gives rise to diminutive OM1 that is diffusely disease prior to bifurcating into OM2 and AVGroove Circumflex  1st obtuse marginal: Proximal long diffusely diseased 60-70% lesion prior to its bifurcation, the superior branch has a ~80% lesion along with a ~60% lesion in the ostium of the inferior branch  AV Groove Branch: trifurcates into small posterolateral branches   Right Coronary Artery: Moderate to  large caliber vessel with a widely patent proximal stent followed by a long, diffusely diseased 60-70% irregular portion.  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  All lesions are PCI amenable targets, but would result in a staged multivessel / multistent revascularization with at least 2 bifurcation lesions.  As a diabetic with multi-vessel disease, CABG is likely the best option for revascularization.  Mildly reduced Ejection Fraction with previously documented inferobasal hypokinesis.  Normal LVEDP  Plan:  Post-Radial catheterization care with anticipated discharge later today.  CT Surgical consultation to discuss revascularization options.  If not able to be seen in short stay prior to discharge, he will be scheduled to be seen as an outpatient.  He will follow-up with Dr. Aldona Bar at [place] as scheduled.  Will add long acting nitrate for angina relief prior to revascularization.   The case and results was discussed with the patient (and family). The case and results was discussed with the patient's Cardiologist.  Time  Spend Directly with Patient:  45 minutes  Kimya Mccahill W, M.D., M.S. [facility] [address] [place],   [id number]  [phone]  12/13/2011 10:28 PM

## 2011-12-17 ENCOUNTER — Institutional Professional Consult (permissible substitution) (INDEPENDENT_AMBULATORY_CARE_PROVIDER_SITE_OTHER): Payer: Medicare Other | Admitting: Thoracic Surgery (Cardiothoracic Vascular Surgery)

## 2011-12-17 ENCOUNTER — Other Ambulatory Visit: Payer: Self-pay | Admitting: Thoracic Surgery (Cardiothoracic Vascular Surgery)

## 2011-12-17 ENCOUNTER — Encounter: Payer: Self-pay | Admitting: Thoracic Surgery (Cardiothoracic Vascular Surgery)

## 2011-12-17 ENCOUNTER — Other Ambulatory Visit: Payer: Self-pay

## 2011-12-17 VITALS — BP 153/73 | HR 67 | Resp 18 | Ht 68.0 in | Wt 165.0 lb

## 2011-12-17 DIAGNOSIS — I2510 Atherosclerotic heart disease of native coronary artery without angina pectoris: Secondary | ICD-10-CM

## 2011-12-17 NOTE — Progress Notes (Signed)
[address]            [place] [address]          [phone]     CARDIOTHORACIC SURGERY CONSULTATION REPORT  PCP is Odette Fraction, MD, MD Referring Provider is [facility], Leonie Green, MD Primary Cardiologist is LITTLE, AL, MD  Chief Complaint  Patient presents with  . Coronary Artery Disease    Referral from Dr Ellyn Hack for eval on 3V CAD, Cath'ed on 12/13/11    HPI:  Patient is a 66 year old retired white male from [place] with known history of coronary artery disease hypertension hyperlipidemia and type 2 diabetes mellitus who recently developed new onset symptoms of exertional angina. He was seen in the office by Dr. Rex Kras been scheduled for elective cardiac catheterization. This was performed 12/13/2011 by Dr. Ellyn Hack and the patient was found to have severe three-vessel coronary artery disease with mild left ventricular systolic dysfunction. The patient has been referred to consider elective coronary artery bypass grafting.  Past Medical History  Diagnosis Date  . Diabetes mellitus   . Hypertension   . Coronary artery disease   . Acute myocardial infarction 1988  . Hyperlipidemia   . Benign prostatic hypertrophy   . Back pain   . Cyst     right shoulder    Past Surgical History  Procedure Date  . Back surgery 1970  . Mass excision 10/24/11    R arm  . Lumbar fusion   . Coronary angioplasty 1994    OM  . Coronary angioplasty 1997    LAD  . Coronary angioplasty with stent placement 1999    RCA  . Coronary angioplasty with stent placement 2001    RCA  . Incision and drainage of wound 2006    axilla    Family History  Problem Relation Age of Onset  . Cancer Mother     breast  . Cancer Father     bone    Social History History  Substance Use Topics  . Smoking status: Former Smoker    Types: Cigarettes    Quit date: 10/01/1986  . Smokeless tobacco: Never Used   Comment: quit about 30 yrs ago  .  Alcohol Use: No    Current Outpatient Prescriptions  Medication Sig Dispense Refill  . amLODipine-valsartan (EXFORGE) 10-320 MG per tablet Take 1 tablet by mouth daily.      [name] aspirin EC 81 MG tablet Take 81 mg by mouth daily.      [name] doxazosin (CARDURA) 4 MG tablet Take 4 mg by mouth daily.       [name] glyBURIDE-metformin (GLUCOVANCE) 5-500 MG per tablet Take 1 tablet by mouth 2 (two) times daily with a meal.      . hydrALAZINE (APRESOLINE) 50 MG tablet Take 50 mg by mouth 2 (two) times daily.      . isosorbide mononitrate (IMDUR) 30 MG 24 hr tablet Take 1 tablet (30 mg total) by mouth daily.  30 tablet  3  . metFORMIN (GLUCOPHAGE) 500 MG tablet Take 1 tablet (500 mg total) by mouth 2 (two) times daily with a meal.      . metoprolol succinate (TOPROL-XL) 25 MG 24 hr tablet Take 25 mg by mouth daily.       [name] omeprazole (PRILOSEC) 40 MG capsule Take 40 mg by mouth daily.      [name]  pravastatin (PRAVACHOL) 20 MG tablet Take 20 mg by mouth daily.         No Known Allergies  Review of Systems:  General:  normal appetite, normal energy  Respiratory:  no cough, no wheezing, no hemoptysis, no pain with inspiration or cough, no shortness of breath   Cardiac:  + exertional no chest pressure or tightness always relieved by rest, + mild exertional SOB, no resting SOB, no PND, no orthopnea, no LE edema, no palpitations, no syncope  GI:   no difficulty swallowing, no hematochezia, no hematemesis, no melena, no constipation, no diarrhea   GU:   no dysuria, no urgency, no frequency   Musculoskeletal: no arthritis, no arthralgia   Vascular:  no pain suggestive of claudication   Neuro:   no symptoms suggestive of TIA's, no seizures, no headaches, no peripheral neuropathy   Endocrine:  Negative, checks blood sugars regularly and reports good control  HEENT:  no loose teeth or painful teeth,  no recent vision changes  Psych:   no anxiety, no depression    Physical Exam:   BP 153/73  Pulse 67  Resp 18  Ht 5'  8" (1.727 m)  Wt 165 lb (74.844 kg)  BMI 25.09 kg/m2  SpO2 98%  General:    well-appearing  HEENT:  Unremarkable   Neck:   no JVD, no bruits, no adenopathy   Chest:   clear to auscultation, symmetrical breath sounds, no wheezes, no rhonchi   CV:   RRR, no  murmur   Abdomen:  soft, non-tender, no masses   Extremities:  warm, well-perfused, pulses not palpable  Rectal/GU  Deferred  Neuro:   Grossly non-focal and symmetrical throughout  Skin:   Clean and dry, no rashes, no breakdown   Diagnostic Tests:  [facility]  CARDIAC CATHETERIZATION REPORT  NAME: Bradley Black MRN: [id number]  DOB: Nov 25, 1945 ADMIT DATE: 12/13/2011  Performing Cardiologist: Leonie Man , MD, MS  Primary Physician: Odette Fraction, MD, MD  Primary Cardiologist: Chase Picket, MD  Procedures Performed:  Left Heart Catheterization via 5 Fr Right Radial Artery access  Left Ventriculography, RAO 11 ml/sec for 33 ml total contrast  Native Coronary Angiography Hemodynamics:  Central Aortic Pressure / Mean Aortic Pressure: 109/61 mmHg, 82 mmHg  LV Pressure / LV End diastolic Pressure: A999333 mHg, 15 mmHg  Left Ventriculography:  EF: 45-50%  Wall Motion: Mild Inferobasal hypokinesis  Coronary Angiographic Data:  Left Main: Moderate to large caliber, bifurcates to LAD & Left Circumflex  Left Anterior Descending (LAD): Moderate caliber vessel with an early mid-vessel focal 70-80% eccentric lesion involving the ostium of 2 proximal small caliber Diagonal branches that each have ~80-90% ostial lesions.  3rd diagonal (D3): Small to moderate caliber, minimal luminal irregularities  Distal LAD - there is a long, tubular 70% stenosis  Circumflex (LCx): Moderate caliber vessel, gives rise to diminutive OM1 that is diffusely disease prior to bifurcating into OM2 and AVGroove Circumflex  1st obtuse marginal: Proximal long diffusely diseased 60-70% lesion prior to its bifurcation, the superior  branch has a ~80% lesion along with a ~60% lesion in the ostium of the inferior branch  AV Groove Branch: trifurcates into small posterolateral branches  Right Coronary Artery: Moderate to large caliber vessel with a widely patent proximal stent followed by a long, diffusely diseased 60-70% irregular portion. The distal RCA normalizes prior to bifurcating into the Posterior Descending Artery & the Posterior Atrioventricular branch (RPAV)  posterior descending artery:  small-moderate caliber, minimal luminal irregularities  posterior lateral system: the RPAV branches into to small-moderate caliber posterolateral branches with minimal luminal irregularities Impression:  Severe multivessel CAD involving both all 3 major coronary artery branches -- LAD (~proximal & distal), OM2, and mid RCA with a widely patent RCA stent.  All lesions are PCI amenable targets, but would result in a staged multivessel / multistent revascularization with at least 2 bifurcation lesions.  As a diabetic with multi-vessel disease, CABG is likely the best option for revascularization.  Mildly reduced Ejection Fraction with previously documented inferobasal hypokinesis.  Normal LVEDP  Leonie Man, M.D., M.S.    Impression:  Severe three-vessel coronary artery disease with mild left ventricular dysfunction. The patient presents with recent onset symptoms of exertional angina. I agree that he would best be treated with surgical revascularization.  Plan:  I've discussed the rationale for coronary artery bypass grafting as primary therapy for severe multivessel coronary artery disease.  The patient understands and accepts all potential associated risks of surgery including but not limited to risk of death, stroke, myocardial infarction, congestive heart failure, respiratory failure, renal failure, bleeding requiring blood transfusion and/or reexploration, arrhythmia, heart block or bradycardia requiring permanent pacemaker,  pneumonia, pleural effusion, wound infection, pulmonary embolus or other thromboembolic complication, chronic pain or other delayed complications.  All questions answered.  I've offered proceed with surgery later this week  But the patient desires to wait until next week for Friday personal reasons. I've cautioned him to avoid any strenuous activity and to go to emergency room promptly should he have any persistent chest pain unrelieved by the administration of sublingual nitroglycerin. We tentatively plan to proceed with surgery on March 28. Patient will return for further followup in preoperative counseling next Monday.     Valentina Gu. Roxy Manns, MD 12/17/2011 11:58 AM

## 2011-12-17 NOTE — Patient Instructions (Signed)
Avoid any strenuous physical activity and go promptly to the nearest emergency room should you have any chest discomfort unrelieved by the administration of sublingual nitroglycerin

## 2011-12-24 ENCOUNTER — Ambulatory Visit (HOSPITAL_COMMUNITY)
Admission: RE | Admit: 2011-12-24 | Discharge: 2011-12-24 | Disposition: A | Discharge status: Home / Self Care | Payer: Medicare Other | Source: Ambulatory Visit | Attending: Thoracic Surgery (Cardiothoracic Vascular Surgery) | Admitting: Thoracic Surgery (Cardiothoracic Vascular Surgery)

## 2011-12-24 ENCOUNTER — Other Ambulatory Visit (HOSPITAL_COMMUNITY): Payer: Self-pay | Admitting: Radiology

## 2011-12-24 ENCOUNTER — Encounter: Payer: Self-pay | Admitting: Thoracic Surgery (Cardiothoracic Vascular Surgery)

## 2011-12-24 ENCOUNTER — Inpatient Hospital Stay (HOSPITAL_COMMUNITY)
Admission: RE | Admit: 2011-12-24 | Discharge: 2011-12-24 | Disposition: A | Discharge status: Home / Self Care | Payer: Medicare Other | Source: Ambulatory Visit | Attending: Thoracic Surgery (Cardiothoracic Vascular Surgery) | Admitting: Thoracic Surgery (Cardiothoracic Vascular Surgery)

## 2011-12-24 ENCOUNTER — Ambulatory Visit (INDEPENDENT_AMBULATORY_CARE_PROVIDER_SITE_OTHER): Payer: Medicare Other | Admitting: Thoracic Surgery (Cardiothoracic Vascular Surgery)

## 2011-12-24 ENCOUNTER — Encounter (HOSPITAL_COMMUNITY)
Admission: RE | Admit: 2011-12-24 | Discharge: 2011-12-24 | Disposition: A | Discharge status: Home / Self Care | Payer: Medicare Other | Source: Ambulatory Visit | Attending: Thoracic Surgery (Cardiothoracic Vascular Surgery) | Admitting: Thoracic Surgery (Cardiothoracic Vascular Surgery)

## 2011-12-24 ENCOUNTER — Other Ambulatory Visit: Payer: Self-pay

## 2011-12-24 ENCOUNTER — Encounter (HOSPITAL_COMMUNITY): Payer: Self-pay

## 2011-12-24 VITALS — BP 134/68 | HR 83 | Temp 97.0°F | Resp 20 | Ht 68.0 in | Wt 164.5 lb

## 2011-12-24 VITALS — BP 132/67 | HR 80 | Resp 16 | Ht 68.0 in | Wt 165.0 lb

## 2011-12-24 DIAGNOSIS — Z01818 Encounter for other preprocedural examination: Secondary | ICD-10-CM | POA: Insufficient documentation

## 2011-12-24 DIAGNOSIS — Z01812 Encounter for preprocedural laboratory examination: Secondary | ICD-10-CM | POA: Insufficient documentation

## 2011-12-24 DIAGNOSIS — Z01810 Encounter for preprocedural cardiovascular examination: Secondary | ICD-10-CM | POA: Insufficient documentation

## 2011-12-24 DIAGNOSIS — I2510 Atherosclerotic heart disease of native coronary artery without angina pectoris: Secondary | ICD-10-CM

## 2011-12-24 DIAGNOSIS — Z01811 Encounter for preprocedural respiratory examination: Secondary | ICD-10-CM | POA: Insufficient documentation

## 2011-12-24 LAB — SURGICAL PCR SCREEN
MRSA, PCR: NEGATIVE
Staphylococcus aureus: NEGATIVE

## 2011-12-24 LAB — BLOOD GAS, ARTERIAL
O2 Saturation: 98.6 %
Patient temperature: 98.6
TCO2: 22.5 mmol/L (ref 0–100)
pH, Arterial: 7.41 (ref 7.350–7.450)

## 2011-12-24 LAB — COMPREHENSIVE METABOLIC PANEL
Albumin: 3.7 g/dL (ref 3.5–5.2)
BUN: 26 mg/dL — ABNORMAL HIGH (ref 6–23)
Calcium: 10.4 mg/dL (ref 8.4–10.5)
Creatinine, Ser: 1.38 mg/dL — ABNORMAL HIGH (ref 0.50–1.35)
GFR calc Af Amer: 59 mL/min — ABNORMAL LOW (ref >90)
Glucose, Bld: 126 mg/dL — ABNORMAL HIGH (ref 70–99)
Potassium: 4.1 mEq/L (ref 3.5–5.1)
Total Protein: 7 g/dL (ref 6.0–8.3)

## 2011-12-24 LAB — URINE MICROSCOPIC-ADD ON

## 2011-12-24 LAB — URINALYSIS, ROUTINE W REFLEX MICROSCOPIC
Nitrite: NEGATIVE
Specific Gravity, Urine: 1.016 (ref 1.005–1.030)
Urobilinogen, UA: 1 mg/dL (ref 0.0–1.0)
pH: 5.5 (ref 5.0–8.0)

## 2011-12-24 LAB — CBC
HCT: 31.3 % — ABNORMAL LOW (ref 39.0–52.0)
Hemoglobin: 10.3 g/dL — ABNORMAL LOW (ref 13.0–17.0)
MCH: 27.3 pg (ref 26.0–34.0)
MCHC: 32.9 g/dL (ref 30.0–36.0)
MCV: 83 fL (ref 78.0–100.0)
RDW: 15.1 % (ref 11.5–15.5)

## 2011-12-24 LAB — PROTIME-INR
INR: 1.04 (ref 0.00–1.49)
Prothrombin Time: 13.8 seconds (ref 11.6–15.2)

## 2011-12-24 MED ORDER — CHLORHEXIDINE GLUCONATE 4 % EX LIQD: 30.0000 mL | CUTANEOUS | Status: DC

## 2011-12-24 MED ORDER — ALBUTEROL SULFATE (5 MG/ML) 0.5% IN NEBU
2.5000 mg | INHALATION_SOLUTION | Freq: Once | RESPIRATORY_TRACT | Status: AC
  Administered 2011-12-24: 2.5 mg via RESPIRATORY_TRACT

## 2011-12-24 NOTE — Pre-Procedure Instructions (Addendum)
20 Bradley Black  12/24/2011   Your procedure is scheduled on:   Thursday 12/27/11   Report to [place] at 530 AM.  Call this number if you have problems the morning of surgery: [phone]   Remember:   Do not eat food:After Midnight.  May have clear liquids: up to 4 Hours before arrival.  Clear liquids include soda, tea, black coffee, apple or grape juice, broth.  Take these medicines the morning of surgery with A SIP OF WATER:  CARDURA, HYDRAZALINE(APRESOLINE), IMDUR, METOPROLOL (TOPROL), PRILOSEC    Do not wear jewelry, make-up or nail polish.  Do not wear lotions, powders, or perfumes. You may wear deodorant.  Do not shave 48 hours prior to surgery.  Do not bring valuables to the hospital.  Contacts, dentures or bridgework may not be worn into surgery.  Leave suitcase in the car. After surgery it may be brought to your room.  For patients admitted to the hospital, checkout time is 11:00 AM the day of discharge.   Patients discharged the day of surgery will not be allowed to drive home.  Name and phone number of your driver:  Special Instructions: CHG Shower Use Special Wash: 1/2 bottle night before surgery and 1/2 bottle morning of surgery.   Please read over the following fact sheets that you were given: Pain Booklet, MRSA Information and Surgical Site Infection Prevention

## 2011-12-24 NOTE — Progress Notes (Signed)
[address]            ,[place] [address]          [phone]     CARDIOTHORACIC SURGERY OFFICE NOTE  Referring Provider is Little, Jeanella Craze, MD PCP is Odette Fraction, MD, MD   HPI:  Patient returns for followup of coronary artery disease with tentatively to proceed with coronary artery bypass grafting later this week. He was seen in the office 12/17/2011. Since then he has done well and he reports no episodes of chest discomfort or shortness of breath. He has had no new problems and is ready to proceed with surgery. The remainder of his review of systems is unchanged from previously.   Current Outpatient Prescriptions  Medication Sig Dispense Refill  . amLODipine-valsartan (EXFORGE) 10-320 MG per tablet Take 1 tablet by mouth daily.      [name] aspirin EC 81 MG tablet Take 81 mg by mouth daily.      [name] doxazosin (CARDURA) 4 MG tablet Take 4 mg by mouth daily.       [name] glyBURIDE-metformin (GLUCOVANCE) 5-500 MG per tablet Take 1 tablet by mouth 2 (two) times daily with a meal.      . hydrALAZINE (APRESOLINE) 50 MG tablet Take 50 mg by mouth 2 (two) times daily.      . isosorbide mononitrate (IMDUR) 30 MG 24 hr tablet Take 1 tablet (30 mg total) by mouth daily.  30 tablet  3  . metFORMIN (GLUCOPHAGE) 500 MG tablet Take 1 tablet (500 mg total) by mouth 2 (two) times daily with a meal.      . metoprolol succinate (TOPROL-XL) 25 MG 24 hr tablet Take 25 mg by mouth daily.       [name] omeprazole (PRILOSEC) 40 MG capsule Take 40 mg by mouth daily.      . pravastatin (PRAVACHOL) 20 MG tablet Take 20 mg by mouth daily.        No current facility-administered medications for this visit.   Facility-Administered Medications Ordered in Other Visits  Medication Dose Route Frequency Provider Last Rate Last Dose  . albuterol (PROVENTIL) (5 MG/ML) 0.5% nebulizer solution 2.5 mg  2.5 mg Nebulization Once Rexene Alberts, MD   2.5 mg at 12/24/11 1338  . chlorhexidine  (HIBICLENS) 4 % liquid 2 application  30 mL Topical UD Rexene Alberts, MD          Physical Exam:   BP 132/67  Pulse 80  Resp 16  Ht 5\' 8"  (1.727 m)  Wt 165 lb (74.844 kg)  BMI 25.09 kg/m2  SpO2 98%  General:  Well-appearing  Chest:   Clear to auscultation  CV:   Regular rate and rhythm  Incisions:  n/a  Abdomen:  Soft and nontender  Extremities:  Warm and well-perfused  Diagnostic Tests:  Results for Bradley Black, Bradley Black (MRN [id number]) as of 12/24/2011 16:00  Ref. Range 12/24/2011 12:21  Sodium Latest Range: 135-145 mEq/L 140  Potassium Latest Range: 3.5-5.1 mEq/L 4.1  Chloride Latest Range: 96-112 mEq/L 107  CO2 Latest Range: 19-32 mEq/L 20  BUN Latest Range: 6-23 mg/dL 26 (H)  Creat Latest Range: 0.50-1.35 mg/dL 1.38 (H)  Calcium Latest Range: 8.4-10.5 mg/dL 10.4  GFR calc non Af Amer Latest Range: >90 mL/min 51 (L)  GFR calc Af Amer Latest Range: >90 mL/min 59 (L)  Glucose Latest Range: 70-99  mg/dL 126 (H)  Alkaline Phosphatase Latest Range: 39-117 U/L 73  Albumin Latest Range: 3.5-5.2 g/dL 3.7  AST Latest Range: 0-37 U/L 12  ALT Latest Range: 0-53 U/L 14  Total Protein Latest Range: 6.0-8.3 g/dL 7.0  Total Bilirubin Latest Range: 0.3-1.2 mg/dL 0.5  WBC Latest Range: 4.0-10.5 K/uL 6.9  RBC Latest Range: 4.22-5.81 MIL/uL 3.77 (L)  HGB Latest Range: 13.0-17.0 g/dL 10.3 (L)  HCT Latest Range: 39.0-52.0 % 31.3 (L)  MCV Latest Range: 78.0-100.0 fL 83.0  MCH Latest Range: 26.0-34.0 pg 27.3  MCHC Latest Range: 30.0-36.0 g/dL 32.9  RDW Latest Range: 11.5-15.5 % 15.1  Platelets Latest Range: 150-400 K/uL 184  Prothrombin Time Latest Range: 11.6-15.2 seconds 13.8  INR Latest Range: 0.00-1.49  1.04  aPTT Latest Range: 24-37 seconds 30   CHEST - 2 VIEW 12/24/2011 Comparison: 12/11/2011  Findings: Borderline cardiomegaly and again noted. No acute  infiltrate or pleural effusion. No pulmonary edema. Mild  degenerative changes thoracic spine.  IMPRESSION:  No active disease.  No significant change.  Original Report Authenticated By: Lahoma Crocker, M.D.   Impression:  Severe three-vessel coronary artery disease with mild left ventricular dysfunction and recent onset symptoms of exertional angina.  Plan:  We plan to proceed with coronary artery bypass grafting on Thursday, March 28.  The patient has been seen in consultation and counseled at length regarding the indications, risks and potential benefits of surgery.  All questions have been answered, and the patient provides full informed consent for the operation as described.    Bradley Gu. Bradley Manns, MD 12/24/2011 3:59 PM

## 2011-12-24 NOTE — Progress Notes (Signed)
Pre-op Cardiac Surgery  Carotid Findings:  No ICA stenosis.  Vertebral artery flow is antegrade.  Upper Extremity Right Left  Brachial Pressures 161T 148T  Radial Waveforms T T  Ulnar Waveforms T T  Palmar Arch (Allen's Test) wnl Doppler signal remains normal with radial compression and diminishes >50% with ulnar compression.   Findings:      Lower  Extremity Right Left  Dorsalis Pedis 161B 153B  Anterior Tibial    Posterior Tibial 163B 159B  Ankle/Brachial Indices 1.0 1.0    Findings:  WNL

## 2011-12-24 NOTE — Progress Notes (Signed)
Dopplers done in epic.   pfts scheduled today.  Cath in epic

## 2011-12-25 LAB — HEMOGLOBIN A1C
Hgb A1c MFr Bld: 6.9 % — ABNORMAL HIGH (ref <5.7)
Mean Plasma Glucose: 151 mg/dL — ABNORMAL HIGH (ref <117)

## 2011-12-25 NOTE — Consult Note (Signed)
Anesthesia:  Patient is a 66 year old male scheduled for a CABG on 12/27/11.  History includes 3V CAD, former smoker, HLD, back pain with history of lumbar fusion, MI, BPH, DM2.  PCP is Dr. Odette Fraction.  Cardiologist is Dr. Chase Picket.  EKG on 12/24/11 showed NSR, occasional PVCs, inferior infarct (age undetermined).  Cath done on 12/13/11 showed: Severe multivessel CAD involving both all 3 major coronary artery branches -- LAD (~proximal & distal), OM2, and mid RCA with a widely patent RCA stent.  All lesions are PCI amenable targets, but would result in a staged multivessel / multistent revascularization with at least 2 bifurcation lesions.  As a diabetic with multi-vessel disease, CABG is likely the best option for revascularization.  Mildly reduced Ejection Fraction with previously documented inferobasal hypokinesis.  Normal LVEDP EF 45-50%  I requested records from [facility], but these are still pending.  Labs noted.  Cr 1.38.  H/H 10.3/31.3.  T&S already done.  Coags WNL.  Plan to proceed if no significant change in his status.

## 2011-12-26 MED ORDER — EPINEPHRINE HCL 1 MG/ML IJ SOLN
0.5000 ug/min | INTRAVENOUS | Status: DC
  Filled 2011-12-26: qty 4

## 2011-12-26 MED ORDER — METOPROLOL TARTRATE 12.5 MG HALF TABLET: 12.5000 mg | ORAL_TABLET | Freq: Once | ORAL | Status: DC

## 2011-12-26 MED ORDER — DEXTROSE 5 % IV SOLN
1.5000 g | INTRAVENOUS | Status: AC
  Administered 2011-12-27: .75 g via INTRAVENOUS
  Administered 2011-12-27: 1.5 g via INTRAVENOUS
  Filled 2011-12-26: qty 1.5

## 2011-12-26 MED ORDER — DOPAMINE-DEXTROSE 3.2-5 MG/ML-% IV SOLN
2.0000 ug/kg/min | INTRAVENOUS | Status: DC
  Filled 2011-12-26: qty 250

## 2011-12-26 MED ORDER — DEXTROSE 5 % IV SOLN
30.0000 ug/min | INTRAVENOUS | Status: AC
  Administered 2011-12-27: 20 ug/min via INTRAVENOUS
  Filled 2011-12-26: qty 2

## 2011-12-26 MED ORDER — VANCOMYCIN HCL 1000 MG IV SOLR
1250.0000 mg | INTRAVENOUS | Status: AC
  Administered 2011-12-27: 1250 mg via INTRAVENOUS
  Filled 2011-12-26: qty 1250

## 2011-12-26 MED ORDER — SODIUM CHLORIDE 0.9 % IV SOLN
0.1000 ug/kg/h | INTRAVENOUS | Status: AC
  Administered 2011-12-27: .2 ug/kg/h via INTRAVENOUS
  Filled 2011-12-26: qty 4

## 2011-12-26 MED ORDER — MAGNESIUM SULFATE 50 % IJ SOLN
40.0000 meq | INTRAMUSCULAR | Status: DC
  Filled 2011-12-26: qty 10

## 2011-12-26 MED ORDER — VERAPAMIL HCL 2.5 MG/ML IV SOLN
INTRAVENOUS | Status: AC
  Administered 2011-12-27: 10:00:00
  Filled 2011-12-26 (×2): qty 2.5

## 2011-12-26 MED ORDER — DEXTROSE 5 % IV SOLN
750.0000 mg | INTRAVENOUS | Status: DC
  Filled 2011-12-26: qty 750

## 2011-12-26 MED ORDER — POTASSIUM CHLORIDE 2 MEQ/ML IV SOLN
80.0000 meq | INTRAVENOUS | Status: DC
  Filled 2011-12-26: qty 40

## 2011-12-26 MED ORDER — SODIUM CHLORIDE 0.9 % IV SOLN
INTRAVENOUS | Status: AC
  Administered 2011-12-27: 69.8 mL/h via INTRAVENOUS
  Filled 2011-12-26: qty 40

## 2011-12-26 MED ORDER — NITROGLYCERIN IN D5W 200-5 MCG/ML-% IV SOLN
2.0000 ug/min | INTRAVENOUS | Status: AC
  Administered 2011-12-27: 5 ug/min via INTRAVENOUS
  Filled 2011-12-26: qty 250

## 2011-12-26 MED ORDER — SODIUM CHLORIDE 0.9 % IV SOLN
INTRAVENOUS | Status: AC
  Administered 2011-12-27: 2.3 [IU]/h via INTRAVENOUS
  Filled 2011-12-26: qty 1

## 2011-12-27 ENCOUNTER — Encounter (HOSPITAL_COMMUNITY): Payer: Self-pay | Admitting: Thoracic Surgery (Cardiothoracic Vascular Surgery)

## 2011-12-27 ENCOUNTER — Inpatient Hospital Stay (HOSPITAL_COMMUNITY): Payer: Medicare Other

## 2011-12-27 ENCOUNTER — Other Ambulatory Visit: Payer: Self-pay

## 2011-12-27 ENCOUNTER — Ambulatory Visit (HOSPITAL_COMMUNITY): Payer: Medicare Other | Admitting: Vascular Surgery

## 2011-12-27 ENCOUNTER — Inpatient Hospital Stay (HOSPITAL_COMMUNITY)
Admission: RE | Admit: 2011-12-27 | Discharge: 2012-01-02 | DRG: 236 | Disposition: A | Discharge status: Home / Self Care | Payer: Medicare Other | Source: Ambulatory Visit | Attending: Thoracic Surgery (Cardiothoracic Vascular Surgery) | Admitting: Thoracic Surgery (Cardiothoracic Vascular Surgery)

## 2011-12-27 ENCOUNTER — Encounter (HOSPITAL_COMMUNITY)
Admission: RE | Disposition: A | Discharge status: Home / Self Care | Payer: Self-pay | Source: Ambulatory Visit | Attending: Thoracic Surgery (Cardiothoracic Vascular Surgery)

## 2011-12-27 ENCOUNTER — Encounter (HOSPITAL_COMMUNITY): Payer: Self-pay | Admitting: *Deleted

## 2011-12-27 ENCOUNTER — Encounter (HOSPITAL_COMMUNITY): Payer: Self-pay | Admitting: Vascular Surgery

## 2011-12-27 DIAGNOSIS — J4489 Other specified chronic obstructive pulmonary disease: Secondary | ICD-10-CM | POA: Diagnosis present

## 2011-12-27 DIAGNOSIS — J449 Chronic obstructive pulmonary disease, unspecified: Secondary | ICD-10-CM | POA: Diagnosis present

## 2011-12-27 DIAGNOSIS — J9819 Other pulmonary collapse: Secondary | ICD-10-CM | POA: Diagnosis not present

## 2011-12-27 DIAGNOSIS — I10 Essential (primary) hypertension: Secondary | ICD-10-CM | POA: Diagnosis present

## 2011-12-27 DIAGNOSIS — D62 Acute posthemorrhagic anemia: Secondary | ICD-10-CM | POA: Diagnosis not present

## 2011-12-27 DIAGNOSIS — Z981 Arthrodesis status: Secondary | ICD-10-CM

## 2011-12-27 DIAGNOSIS — N289 Disorder of kidney and ureter, unspecified: Secondary | ICD-10-CM | POA: Diagnosis not present

## 2011-12-27 DIAGNOSIS — I252 Old myocardial infarction: Secondary | ICD-10-CM

## 2011-12-27 DIAGNOSIS — I2510 Atherosclerotic heart disease of native coronary artery without angina pectoris: Principal | ICD-10-CM | POA: Diagnosis present

## 2011-12-27 DIAGNOSIS — N400 Enlarged prostate without lower urinary tract symptoms: Secondary | ICD-10-CM | POA: Diagnosis present

## 2011-12-27 DIAGNOSIS — Z9861 Coronary angioplasty status: Secondary | ICD-10-CM

## 2011-12-27 DIAGNOSIS — I209 Angina pectoris, unspecified: Secondary | ICD-10-CM | POA: Diagnosis present

## 2011-12-27 DIAGNOSIS — Z87891 Personal history of nicotine dependence: Secondary | ICD-10-CM

## 2011-12-27 DIAGNOSIS — E119 Type 2 diabetes mellitus without complications: Secondary | ICD-10-CM | POA: Diagnosis present

## 2011-12-27 DIAGNOSIS — E785 Hyperlipidemia, unspecified: Secondary | ICD-10-CM | POA: Diagnosis present

## 2011-12-27 DIAGNOSIS — Z951 Presence of aortocoronary bypass graft: Secondary | ICD-10-CM

## 2011-12-27 DIAGNOSIS — J90 Pleural effusion, not elsewhere classified: Secondary | ICD-10-CM | POA: Diagnosis not present

## 2011-12-27 DIAGNOSIS — K219 Gastro-esophageal reflux disease without esophagitis: Secondary | ICD-10-CM | POA: Diagnosis present

## 2011-12-27 HISTORY — DX: Presence of aortocoronary bypass graft: Z95.1

## 2011-12-27 HISTORY — PX: CORONARY ARTERY BYPASS GRAFT: SHX141

## 2011-12-27 HISTORY — PX: INTRAOPERATIVE TRANSESOPHAGEAL ECHOCARDIOGRAM: SHX5062

## 2011-12-27 LAB — POCT I-STAT 4, (NA,K, GLUC, HGB,HCT)
Glucose, Bld: 136 mg/dL — ABNORMAL HIGH (ref 70–99)
Glucose, Bld: 151 mg/dL — ABNORMAL HIGH (ref 70–99)
Glucose, Bld: 140 mg/dL — ABNORMAL HIGH (ref 70–99)
Glucose, Bld: 98 mg/dL (ref 70–99)
HCT: 27 % — ABNORMAL LOW (ref 39.0–52.0)
HCT: 27 % — ABNORMAL LOW (ref 39.0–52.0)
HCT: 20 % — ABNORMAL LOW (ref 39.0–52.0)
Hemoglobin: 9.2 g/dL — ABNORMAL LOW (ref 13.0–17.0)
Hemoglobin: 6.8 g/dL — CL (ref 13.0–17.0)
Hemoglobin: 6.8 g/dL — CL (ref 13.0–17.0)
Potassium: 3.7 mEq/L (ref 3.5–5.1)
Potassium: 4 mEq/L (ref 3.5–5.1)
Sodium: 144 mEq/L (ref 135–145)
Sodium: 144 mEq/L (ref 135–145)

## 2011-12-27 LAB — POCT I-STAT, CHEM 8
BUN: 28 mg/dL — ABNORMAL HIGH (ref 6–23)
Calcium, Ion: 1.25 mmol/L (ref 1.12–1.32)
Chloride: 115 mEq/L — ABNORMAL HIGH (ref 96–112)
Creatinine, Ser: 1.6 mg/dL — ABNORMAL HIGH (ref 0.50–1.35)
Glucose, Bld: 169 mg/dL — ABNORMAL HIGH (ref 70–99)
HCT: 32 % — ABNORMAL LOW (ref 39.0–52.0)

## 2011-12-27 LAB — POCT I-STAT 3, ART BLOOD GAS (G3+)
Acid-base deficit: 6 mmol/L — ABNORMAL HIGH (ref 0.0–2.0)
Acid-base deficit: 5 mmol/L — ABNORMAL HIGH (ref 0.0–2.0)
Bicarbonate: 22.5 mEq/L (ref 20.0–24.0)
Bicarbonate: 21.9 mEq/L (ref 20.0–24.0)
Bicarbonate: 19.4 mEq/L — ABNORMAL LOW (ref 20.0–24.0)
Bicarbonate: 19.9 mEq/L — ABNORMAL LOW (ref 20.0–24.0)
O2 Saturation: 99 %
O2 Saturation: 97 %
O2 Saturation: 96 %
O2 Saturation: 92 %
Patient temperature: 36.1
TCO2: 24 mmol/L (ref 0–100)
TCO2: 23 mmol/L (ref 0–100)
TCO2: 22 mmol/L (ref 0–100)
TCO2: 21 mmol/L (ref 0–100)
TCO2: 21 mmol/L (ref 0–100)
TCO2: 23 mmol/L (ref 0–100)
pCO2 arterial: 35.9 mmHg (ref 35.0–45.0)
pCO2 arterial: 32.5 mmHg — ABNORMAL LOW (ref 35.0–45.0)
pCO2 arterial: 39.3 mmHg (ref 35.0–45.0)
pH, Arterial: 7.405 (ref 7.350–7.450)
pH, Arterial: 7.33 — ABNORMAL LOW (ref 7.350–7.450)
pH, Arterial: 7.411 (ref 7.350–7.450)
pO2, Arterial: 245 mmHg — ABNORMAL HIGH (ref 80.0–100.0)
pO2, Arterial: 78 mmHg — ABNORMAL LOW (ref 80.0–100.0)
pO2, Arterial: 83 mmHg (ref 80.0–100.0)

## 2011-12-27 LAB — CREATININE, SERUM
Creatinine, Ser: 1.35 mg/dL (ref 0.50–1.35)
GFR calc non Af Amer: 52 mL/min — ABNORMAL LOW (ref >90)

## 2011-12-27 LAB — GLUCOSE, CAPILLARY
Glucose-Capillary: 92 mg/dL (ref 70–99)
Glucose-Capillary: 93 mg/dL (ref 70–99)
Glucose-Capillary: 141 mg/dL — ABNORMAL HIGH (ref 70–99)

## 2011-12-27 LAB — CBC
HCT: 29.3 % — ABNORMAL LOW (ref 39.0–52.0)
HCT: 32.6 % — ABNORMAL LOW (ref 39.0–52.0)
Hemoglobin: 10.7 g/dL — ABNORMAL LOW (ref 13.0–17.0)
MCHC: 33.1 g/dL (ref 30.0–36.0)
MCHC: 32.8 g/dL (ref 30.0–36.0)
MCV: 83 fL (ref 78.0–100.0)
Platelets: 106 10*3/uL — ABNORMAL LOW (ref 150–400)
RDW: 15.2 % (ref 11.5–15.5)
WBC: 11.2 10*3/uL — ABNORMAL HIGH (ref 4.0–10.5)

## 2011-12-27 LAB — HEMOGLOBIN AND HEMATOCRIT, BLOOD: Hemoglobin: 6.4 g/dL — CL (ref 13.0–17.0)

## 2011-12-27 LAB — PROTIME-INR
INR: 1.35 (ref 0.00–1.49)
Prothrombin Time: 16.9 seconds — ABNORMAL HIGH (ref 11.6–15.2)

## 2011-12-27 LAB — APTT: aPTT: 31 seconds (ref 24–37)

## 2011-12-27 LAB — PREPARE RBC (CROSSMATCH)

## 2011-12-27 SURGERY — CORONARY ARTERY BYPASS GRAFTING (CABG)
Anesthesia: General | Site: Chest | Wound class: Clean

## 2011-12-27 MED ORDER — HEPARIN SODIUM (PORCINE) 1000 UNIT/ML IJ SOLN
INTRAMUSCULAR | Status: DC | PRN
  Administered 2011-12-27: 30000 [IU] via INTRAVENOUS

## 2011-12-27 MED ORDER — ALBUMIN HUMAN 5 % IV SOLN: 250.0000 mL | INTRAVENOUS | Status: AC | PRN

## 2011-12-27 MED ORDER — FENTANYL CITRATE 0.05 MG/ML IJ SOLN
INTRAMUSCULAR | Status: DC | PRN
  Administered 2011-12-27 (×2): 250 ug via INTRAVENOUS
  Administered 2011-12-27: 50 ug via INTRAVENOUS
  Administered 2011-12-27 (×2): 150 ug via INTRAVENOUS
  Administered 2011-12-27: 250 ug via INTRAVENOUS
  Administered 2011-12-27 (×3): 100 ug via INTRAVENOUS
  Administered 2011-12-27: 150 ug via INTRAVENOUS
  Administered 2011-12-27 (×2): 100 ug via INTRAVENOUS

## 2011-12-27 MED ORDER — ACETAMINOPHEN 160 MG/5ML PO SOLN: 650.0000 mg | ORAL | Status: AC

## 2011-12-27 MED ORDER — PROPOFOL 10 MG/ML IV EMUL
INTRAVENOUS | Status: DC | PRN
  Administered 2011-12-27: 50 mg via INTRAVENOUS

## 2011-12-27 MED ORDER — VECURONIUM BROMIDE 10 MG IV SOLR
INTRAVENOUS | Status: DC | PRN
  Administered 2011-12-27: 5 mg via INTRAVENOUS
  Administered 2011-12-27 (×2): 10 mg via INTRAVENOUS
  Administered 2011-12-27: 5 mg via INTRAVENOUS

## 2011-12-27 MED ORDER — OXYCODONE HCL 5 MG PO TABS
5.0000 mg | ORAL_TABLET | ORAL | Status: DC | PRN
  Administered 2011-12-28: 5 mg via ORAL
  Administered 2011-12-28: 10 mg via ORAL
  Administered 2011-12-28: 5 mg via ORAL
  Administered 2011-12-29 – 2011-12-30 (×4): 10 mg via ORAL
  Filled 2011-12-27 (×4): qty 2
  Filled 2011-12-27: qty 1
  Filled 2011-12-27: qty 2
  Filled 2011-12-27: qty 1

## 2011-12-27 MED ORDER — METOPROLOL TARTRATE 1 MG/ML IV SOLN: 2.5000 mg | INTRAVENOUS | Status: DC | PRN

## 2011-12-27 MED ORDER — VANCOMYCIN HCL 1000 MG IV SOLR
1000.0000 mg | Freq: Once | INTRAVENOUS | Status: AC
  Administered 2011-12-27: 1000 mg via INTRAVENOUS
  Filled 2011-12-27: qty 1000

## 2011-12-27 MED ORDER — SODIUM CHLORIDE 0.9 % IV SOLN: 250.0000 mL | INTRAVENOUS | Status: DC

## 2011-12-27 MED ORDER — 0.9 % SODIUM CHLORIDE (POUR BTL) OPTIME
TOPICAL | Status: DC | PRN
  Administered 2011-12-27 (×2): 1000 mL
  Administered 2011-12-27: 6000 mL

## 2011-12-27 MED ORDER — PHENYLEPHRINE HCL 10 MG/ML IJ SOLN
0.0000 ug/min | INTRAVENOUS | Status: DC
  Filled 2011-12-27: qty 2

## 2011-12-27 MED ORDER — ONDANSETRON HCL 4 MG/2ML IJ SOLN: 4.0000 mg | Freq: Four times a day (QID) | INTRAMUSCULAR | Status: DC | PRN

## 2011-12-27 MED ORDER — METOPROLOL TARTRATE 25 MG/10 ML ORAL SUSPENSION
12.5000 mg | Freq: Two times a day (BID) | ORAL | Status: DC
  Filled 2011-12-27 (×7): qty 5

## 2011-12-27 MED ORDER — SODIUM CHLORIDE 0.9 % IV SOLN: INTRAVENOUS | Status: DC

## 2011-12-27 MED ORDER — ACETAMINOPHEN 160 MG/5ML PO SOLN: 975.0000 mg | Freq: Four times a day (QID) | ORAL | Status: DC

## 2011-12-27 MED ORDER — INSULIN ASPART 100 UNIT/ML ~~LOC~~ SOLN
0.0000 [IU] | SUBCUTANEOUS | Status: AC
  Administered 2011-12-27 (×3): 2 [IU] via SUBCUTANEOUS

## 2011-12-27 MED ORDER — SODIUM CHLORIDE 0.9 % IJ SOLN
3.0000 mL | Freq: Two times a day (BID) | INTRAMUSCULAR | Status: DC
  Administered 2011-12-28 – 2011-12-31 (×7): 3 mL via INTRAVENOUS

## 2011-12-27 MED ORDER — SIMVASTATIN 10 MG PO TABS
10.0000 mg | ORAL_TABLET | Freq: Every day | ORAL | Status: DC
  Administered 2011-12-29 – 2012-01-01 (×4): 10 mg via ORAL
  Filled 2011-12-27 (×7): qty 1

## 2011-12-27 MED ORDER — ASPIRIN 81 MG PO CHEW: 324.0000 mg | CHEWABLE_TABLET | Freq: Every day | ORAL | Status: DC

## 2011-12-27 MED ORDER — DEXTROSE 50 % IV SOLN
INTRAVENOUS | Status: AC
  Administered 2011-12-27: 50 mL via INTRAVENOUS
  Filled 2011-12-27: qty 50

## 2011-12-27 MED ORDER — LEVALBUTEROL HCL 0.63 MG/3ML IN NEBU
0.6300 mg | INHALATION_SOLUTION | Freq: Four times a day (QID) | RESPIRATORY_TRACT | Status: DC | PRN
  Administered 2011-12-28: 0.63 mg via RESPIRATORY_TRACT
  Filled 2011-12-27 (×2): qty 3

## 2011-12-27 MED ORDER — NITROGLYCERIN IN D5W 200-5 MCG/ML-% IV SOLN: 0.0000 ug/min | INTRAVENOUS | Status: DC

## 2011-12-27 MED ORDER — MAGNESIUM SULFATE 40 MG/ML IJ SOLN
4.0000 g | Freq: Once | INTRAMUSCULAR | Status: AC
  Administered 2011-12-27: 4 g via INTRAVENOUS
  Filled 2011-12-27: qty 100

## 2011-12-27 MED ORDER — ASPIRIN EC 325 MG PO TBEC
325.0000 mg | DELAYED_RELEASE_TABLET | Freq: Every day | ORAL | Status: DC
  Administered 2011-12-28 – 2012-01-02 (×6): 325 mg via ORAL
  Filled 2011-12-27 (×6): qty 1

## 2011-12-27 MED ORDER — ACETAMINOPHEN 500 MG PO TABS
1000.0000 mg | ORAL_TABLET | Freq: Four times a day (QID) | ORAL | Status: AC
  Administered 2011-12-28 – 2012-01-01 (×16): 1000 mg via ORAL
  Filled 2011-12-27 (×21): qty 2

## 2011-12-27 MED ORDER — SODIUM CHLORIDE 0.9 % IV SOLN
0.1000 ug/kg/h | INTRAVENOUS | Status: DC
  Filled 2011-12-27: qty 2

## 2011-12-27 MED ORDER — MORPHINE SULFATE 4 MG/ML IJ SOLN
2.0000 mg | INTRAMUSCULAR | Status: DC | PRN
  Administered 2011-12-27 (×2): 4 mg via INTRAVENOUS
  Administered 2011-12-28: 2 mg via INTRAVENOUS
  Administered 2011-12-28 (×2): 4 mg via INTRAVENOUS
  Administered 2011-12-28: 2 mg via INTRAVENOUS
  Administered 2011-12-28 – 2011-12-29 (×5): 4 mg via INTRAVENOUS
  Filled 2011-12-27 (×10): qty 1

## 2011-12-27 MED ORDER — SODIUM CHLORIDE 0.45 % IV SOLN: INTRAVENOUS | Status: DC

## 2011-12-27 MED ORDER — ACETAMINOPHEN 650 MG RE SUPP
650.0000 mg | RECTAL | Status: AC
  Administered 2011-12-27: 650 mg via RECTAL

## 2011-12-27 MED ORDER — SODIUM CHLORIDE 0.9 % IV SOLN
INTRAVENOUS | Status: DC
  Filled 2011-12-27: qty 1

## 2011-12-27 MED ORDER — INSULIN ASPART 100 UNIT/ML ~~LOC~~ SOLN
0.0000 [IU] | SUBCUTANEOUS | Status: DC
  Administered 2011-12-28: 2 [IU] via SUBCUTANEOUS
  Administered 2011-12-28: 4 [IU] via SUBCUTANEOUS
  Administered 2011-12-28 – 2011-12-30 (×9): 2 [IU] via SUBCUTANEOUS
  Administered 2011-12-30: 4 [IU] via SUBCUTANEOUS
  Administered 2011-12-30: 2 [IU] via SUBCUTANEOUS

## 2011-12-27 MED ORDER — POTASSIUM CHLORIDE 10 MEQ/50ML IV SOLN: 10.0000 meq | INTRAVENOUS | Status: AC

## 2011-12-27 MED ORDER — SODIUM CHLORIDE 0.9 % IJ SOLN: 3.0000 mL | INTRAMUSCULAR | Status: DC | PRN

## 2011-12-27 MED ORDER — LACTATED RINGERS IV SOLN
INTRAVENOUS | Status: DC
  Administered 2011-12-29: 15:00:00 via INTRAVENOUS

## 2011-12-27 MED ORDER — MORPHINE SULFATE 2 MG/ML IJ SOLN
1.0000 mg | INTRAMUSCULAR | Status: AC | PRN
  Administered 2011-12-27: 2 mg via INTRAVENOUS
  Filled 2011-12-27: qty 1

## 2011-12-27 MED ORDER — METOPROLOL TARTRATE 12.5 MG HALF TABLET
12.5000 mg | ORAL_TABLET | Freq: Two times a day (BID) | ORAL | Status: DC
  Administered 2011-12-28 – 2011-12-29 (×3): 12.5 mg via ORAL
  Filled 2011-12-27 (×7): qty 1

## 2011-12-27 MED ORDER — EPHEDRINE SULFATE 50 MG/ML IJ SOLN
INTRAMUSCULAR | Status: DC | PRN
  Administered 2011-12-27: 5 mg via INTRAVENOUS

## 2011-12-27 MED ORDER — METOPROLOL TARTRATE 25 MG/10 ML ORAL SUSPENSION
12.5000 mg | Freq: Two times a day (BID) | ORAL | Status: DC
Start: 2011-12-27 — End: 2011-12-27
  Filled 2011-12-27: qty 5

## 2011-12-27 MED ORDER — DOCUSATE SODIUM 100 MG PO CAPS
200.0000 mg | ORAL_CAPSULE | Freq: Every day | ORAL | Status: DC
  Administered 2011-12-28 – 2012-01-02 (×6): 200 mg via ORAL
  Filled 2011-12-27 (×4): qty 2

## 2011-12-27 MED ORDER — DEXTROSE 50 % IV SOLN
20.0000 mL | Freq: Once | INTRAVENOUS | Status: AC
  Administered 2011-12-27: 50 mL via INTRAVENOUS

## 2011-12-27 MED ORDER — ACETAMINOPHEN 500 MG PO TABS: 1000.0000 mg | ORAL_TABLET | Freq: Four times a day (QID) | ORAL | Status: DC

## 2011-12-27 MED ORDER — FAMOTIDINE IN NACL 20-0.9 MG/50ML-% IV SOLN
20.0000 mg | Freq: Two times a day (BID) | INTRAVENOUS | Status: AC
  Administered 2011-12-27: 20 mg via INTRAVENOUS

## 2011-12-27 MED ORDER — ACETAMINOPHEN 160 MG/5ML PO SOLN
975.0000 mg | Freq: Four times a day (QID) | ORAL | Status: AC
  Filled 2011-12-27: qty 40.6

## 2011-12-27 MED ORDER — BISACODYL 5 MG PO TBEC
10.0000 mg | DELAYED_RELEASE_TABLET | Freq: Every day | ORAL | Status: DC
  Administered 2011-12-28 – 2012-01-01 (×5): 10 mg via ORAL
  Filled 2011-12-27 (×5): qty 2

## 2011-12-27 MED ORDER — PANTOPRAZOLE SODIUM 40 MG PO TBEC
40.0000 mg | DELAYED_RELEASE_TABLET | Freq: Every day | ORAL | Status: DC
  Administered 2011-12-29 – 2012-01-02 (×5): 40 mg via ORAL
  Filled 2011-12-27 (×4): qty 1

## 2011-12-27 MED ORDER — MIDAZOLAM HCL 2 MG/2ML IJ SOLN: 2.0000 mg | INTRAMUSCULAR | Status: DC | PRN

## 2011-12-27 MED ORDER — CEFUROXIME SODIUM 1.5 G IJ SOLR
1.5000 g | Freq: Two times a day (BID) | INTRAMUSCULAR | Status: AC
  Administered 2011-12-27 – 2011-12-29 (×4): 1.5 g via INTRAVENOUS
  Filled 2011-12-27 (×5): qty 1.5

## 2011-12-27 MED ORDER — METOPROLOL TARTRATE 12.5 MG HALF TABLET
12.5000 mg | ORAL_TABLET | Freq: Two times a day (BID) | ORAL | Status: DC
  Filled 2011-12-27: qty 1

## 2011-12-27 MED ORDER — HEMOSTATIC AGENTS (NO CHARGE) OPTIME
TOPICAL | Status: DC | PRN
  Administered 2011-12-27: 3 via TOPICAL

## 2011-12-27 MED ORDER — INSULIN REGULAR BOLUS VIA INFUSION
0.0000 [IU] | Freq: Three times a day (TID) | INTRAVENOUS | Status: DC
  Filled 2011-12-27: qty 10

## 2011-12-27 MED ORDER — PROTAMINE SULFATE 10 MG/ML IV SOLN
INTRAVENOUS | Status: DC | PRN
  Administered 2011-12-27: 40 mg via INTRAVENOUS
  Administered 2011-12-27 (×4): 50 mg via INTRAVENOUS
  Administered 2011-12-27: 10 mg via INTRAVENOUS

## 2011-12-27 MED ORDER — LACTATED RINGERS IV SOLN
INTRAVENOUS | Status: DC | PRN
  Administered 2011-12-27 (×2): via INTRAVENOUS

## 2011-12-27 MED ORDER — MIDAZOLAM HCL 5 MG/5ML IJ SOLN
INTRAMUSCULAR | Status: DC | PRN
  Administered 2011-12-27: 3 mg via INTRAVENOUS
  Administered 2011-12-27: 2 mg via INTRAVENOUS
  Administered 2011-12-27 (×2): 4 mg via INTRAVENOUS
  Administered 2011-12-27: 3 mg via INTRAVENOUS
  Administered 2011-12-27 (×2): 2 mg via INTRAVENOUS

## 2011-12-27 MED ORDER — CALCIUM CHLORIDE 10 % IV SOLN
1.0000 g | Freq: Once | INTRAVENOUS | Status: AC | PRN
  Filled 2011-12-27: qty 10

## 2011-12-27 MED ORDER — BISACODYL 10 MG RE SUPP: 10.0000 mg | Freq: Every day | RECTAL | Status: DC

## 2011-12-27 SURGICAL SUPPLY — 136 items
ADAPTER CARDIO PERF ANTE/RETRO (ADAPTER) IMPLANT
ADH SKN CLS APL DERMABOND .7 (GAUZE/BANDAGES/DRESSINGS) ×1
ADPR PRFSN 84XANTGRD RTRGD (ADAPTER)
APL SKNCLS STERI-STRIP NONHPOA (GAUZE/BANDAGES/DRESSINGS) ×1
APPLIER CLIP 9.375 MED OPEN (MISCELLANEOUS)
APPLIER CLIP 9.375 SM OPEN (CLIP)
APR CLP MED 9.3 20 MLT OPN (MISCELLANEOUS)
APR CLP SM 9.3 20 MLT OPN (CLIP)
ATTRACTOMAT 16X20 MAGNETIC DRP (DRAPES) ×2 IMPLANT
BAG DECANTER FOR FLEXI CONT (MISCELLANEOUS) ×2 IMPLANT
BANDAGE ELASTIC 4 VELCRO ST LF (GAUZE/BANDAGES/DRESSINGS) ×2 IMPLANT
BANDAGE ELASTIC 6 VELCRO ST LF (GAUZE/BANDAGES/DRESSINGS) ×2 IMPLANT
BANDAGE GAUZE ELAST BULKY 4 IN (GAUZE/BANDAGES/DRESSINGS) ×2 IMPLANT
BASKET HEART (ORDER IN 25'S) (MISCELLANEOUS) ×1
BASKET HEART (ORDER IN 25S) (MISCELLANEOUS) ×1 IMPLANT
BENZOIN TINCTURE PRP APPL 2/3 (GAUZE/BANDAGES/DRESSINGS) ×2 IMPLANT
BLADE STERNUM SYSTEM 6 (BLADE) ×2 IMPLANT
BLADE SURG 11 STRL SS (BLADE) ×1 IMPLANT
BLADE SURG ROTATE 9660 (MISCELLANEOUS) IMPLANT
CANISTER SUCTION 2500CC (MISCELLANEOUS) ×2 IMPLANT
CANNULA GUNDRY RCSP 15FR (MISCELLANEOUS) IMPLANT
CATH CPB KIT OWEN (MISCELLANEOUS) ×2 IMPLANT
CATH HEART VENT LEFT (CATHETERS) IMPLANT
CATH ROBINSON RED A/P 18FR (CATHETERS) ×4 IMPLANT
CATH THORACIC 28FR (CATHETERS) IMPLANT
CATH THORACIC 28FR RT ANG (CATHETERS) IMPLANT
CATH THORACIC 36FR (CATHETERS) ×3 IMPLANT
CATH THORACIC 36FR RT ANG (CATHETERS) ×3 IMPLANT
CLIP APPLIE 9.375 MED OPEN (MISCELLANEOUS) IMPLANT
CLIP APPLIE 9.375 SM OPEN (CLIP) IMPLANT
CLIP FOGARTY SPRING 6M (CLIP) IMPLANT
CLIP RETRACTION 3.0MM CORONARY (MISCELLANEOUS) ×1 IMPLANT
CLIP TI MEDIUM 24 (CLIP) IMPLANT
CLIP TI WIDE RED SMALL 24 (CLIP) ×1 IMPLANT
CLOTH BEACON ORANGE TIMEOUT ST (SAFETY) ×2 IMPLANT
CONN 3/8X1/2 ST GISH (MISCELLANEOUS) ×1 IMPLANT
CONN Y 3/8X3/8X3/8  BEN (MISCELLANEOUS)
CONN Y 3/8X3/8X3/8 BEN (MISCELLANEOUS) IMPLANT
COVER SURGICAL LIGHT HANDLE (MISCELLANEOUS) ×4 IMPLANT
CRADLE DONUT ADULT HEAD (MISCELLANEOUS) ×2 IMPLANT
DERMABOND ADVANCED (GAUZE/BANDAGES/DRESSINGS) ×1
DERMABOND ADVANCED .7 DNX12 (GAUZE/BANDAGES/DRESSINGS) IMPLANT
DRAIN CHANNEL 32F RND 10.7 FF (WOUND CARE) ×3 IMPLANT
DRAPE CARDIOVASCULAR INCISE (DRAPES) ×2
DRAPE SLUSH/WARMER DISC (DRAPES) ×2 IMPLANT
DRAPE SRG 135X102X78XABS (DRAPES) ×1 IMPLANT
DRSG COVADERM 4X14 (GAUZE/BANDAGES/DRESSINGS) ×2 IMPLANT
ELECT REM PT RETURN 9FT ADLT (ELECTROSURGICAL) ×4
ELECTRODE REM PT RTRN 9FT ADLT (ELECTROSURGICAL) ×2 IMPLANT
GLOVE BIO SURGEON STRL SZ 6 (GLOVE) ×2 IMPLANT
GLOVE BIO SURGEON STRL SZ 6.5 (GLOVE) ×2 IMPLANT
GLOVE BIO SURGEON STRL SZ7 (GLOVE) IMPLANT
GLOVE BIO SURGEON STRL SZ7.5 (GLOVE) IMPLANT
GLOVE BIOGEL PI IND STRL 6 (GLOVE) IMPLANT
GLOVE BIOGEL PI IND STRL 6.5 (GLOVE) IMPLANT
GLOVE BIOGEL PI IND STRL 7.0 (GLOVE) IMPLANT
GLOVE BIOGEL PI INDICATOR 6 (GLOVE) ×2
GLOVE BIOGEL PI INDICATOR 6.5 (GLOVE) ×4
GLOVE BIOGEL PI INDICATOR 7.0 (GLOVE) ×3
GLOVE EUDERMIC 7 POWDERFREE (GLOVE) IMPLANT
GLOVE EXAM NITRILE XS STR PU (GLOVE) ×1 IMPLANT
GLOVE ORTHO TXT STRL SZ7.5 (GLOVE) ×4 IMPLANT
GOWN STRL NON-REIN LRG LVL3 (GOWN DISPOSABLE) ×12 IMPLANT
HEMOSTAT POWDER SURGIFOAM 1G (HEMOSTASIS) ×6 IMPLANT
INSERT FOGARTY 61MM (MISCELLANEOUS) ×1 IMPLANT
INSERT FOGARTY XLG (MISCELLANEOUS) ×2 IMPLANT
KIT BASIN OR (CUSTOM PROCEDURE TRAY) ×2 IMPLANT
KIT ROOM TURNOVER OR (KITS) ×2 IMPLANT
KIT SUCTION CATH 14FR (SUCTIONS) ×2 IMPLANT
KIT VASOVIEW W/TROCAR VH 2000 (KITS) ×2 IMPLANT
LEAD PACING MYOCARDI (MISCELLANEOUS) ×2 IMPLANT
MARKER GRAFT CORONARY BYPASS (MISCELLANEOUS) ×6 IMPLANT
NS IRRIG 1000ML POUR BTL (IV SOLUTION) ×10 IMPLANT
PACK OPEN HEART (CUSTOM PROCEDURE TRAY) ×2 IMPLANT
PAD ARMBOARD 7.5X6 YLW CONV (MISCELLANEOUS) ×2 IMPLANT
PENCIL BUTTON HOLSTER BLD 10FT (ELECTRODE) ×2 IMPLANT
PUNCH AORTIC ROTATE 4.0MM (MISCELLANEOUS) ×1 IMPLANT
PUNCH AORTIC ROTATE 4.5MM 8IN (MISCELLANEOUS) IMPLANT
PUNCH AORTIC ROTATE 5MM 8IN (MISCELLANEOUS) IMPLANT
SET CARDIOPLEGIA MPS 5001102 (MISCELLANEOUS) ×1 IMPLANT
SOLUTION ANTI FOG 6CC (MISCELLANEOUS) IMPLANT
SPONGE GAUZE 4X4 12PLY (GAUZE/BANDAGES/DRESSINGS) ×4 IMPLANT
SPONGE INTESTINAL PEANUT (DISPOSABLE) IMPLANT
SPONGE LAP 18X18 X RAY DECT (DISPOSABLE) ×2 IMPLANT
SPONGE LAP 4X18 X RAY DECT (DISPOSABLE) IMPLANT
STOPCOCK 4 WAY LG BORE MALE ST (IV SETS) IMPLANT
STRIP CLOSURE SKIN 1/2X4 (GAUZE/BANDAGES/DRESSINGS) ×2 IMPLANT
SUT BONE WAX W31G (SUTURE) ×2 IMPLANT
SUT ETHIBOND 2 0 SH (SUTURE) ×6
SUT ETHIBOND 2 0 SH 36X2 (SUTURE) IMPLANT
SUT ETHIBOND X763 2 0 SH 1 (SUTURE) ×6 IMPLANT
SUT MNCRL AB 3-0 PS2 18 (SUTURE) ×4 IMPLANT
SUT MNCRL AB 4-0 PS2 18 (SUTURE) ×1 IMPLANT
SUT PDS AB 1 CTX 36 (SUTURE) ×4 IMPLANT
SUT PROLENE 2 0 SH DA (SUTURE) IMPLANT
SUT PROLENE 3 0 SH DA (SUTURE) ×2 IMPLANT
SUT PROLENE 3 0 SH1 36 (SUTURE) IMPLANT
SUT PROLENE 4 0 RB 1 (SUTURE) ×4
SUT PROLENE 4 0 SH DA (SUTURE) IMPLANT
SUT PROLENE 4-0 RB1 .5 CRCL 36 (SUTURE) IMPLANT
SUT PROLENE 5 0 C 1 36 (SUTURE) IMPLANT
SUT PROLENE 6 0 C 1 30 (SUTURE) ×2 IMPLANT
SUT PROLENE 6 0 CC (SUTURE) IMPLANT
SUT PROLENE 7 0 BV 1 (SUTURE) IMPLANT
SUT PROLENE 7 0 BV1 MDA (SUTURE) IMPLANT
SUT PROLENE 7.0 RB 3 (SUTURE) ×9 IMPLANT
SUT PROLENE 8 0 BV175 6 (SUTURE) ×1 IMPLANT
SUT PROLENE BLUE 7 0 (SUTURE) ×2 IMPLANT
SUT PROLENE POLY MONO (SUTURE) ×1 IMPLANT
SUT SILK  1 MH (SUTURE) ×3
SUT SILK 1 MH (SUTURE) ×1 IMPLANT
SUT SILK 2 0 SH CR/8 (SUTURE) IMPLANT
SUT SILK 3 0 SH CR/8 (SUTURE) IMPLANT
SUT STEEL 6MS V (SUTURE) IMPLANT
SUT STEEL STERNAL CCS#1 18IN (SUTURE) IMPLANT
SUT STEEL SZ 6 DBL 3X14 BALL (SUTURE) ×3 IMPLANT
SUT VIC AB 1 CTX 36 (SUTURE)
SUT VIC AB 1 CTX36XBRD ANBCTR (SUTURE) IMPLANT
SUT VIC AB 2-0 CT1 27 (SUTURE) ×2
SUT VIC AB 2-0 CT1 TAPERPNT 27 (SUTURE) IMPLANT
SUT VIC AB 2-0 CTX 27 (SUTURE) IMPLANT
SUT VIC AB 3-0 SH 27 (SUTURE)
SUT VIC AB 3-0 SH 27X BRD (SUTURE) IMPLANT
SUT VIC AB 3-0 X1 27 (SUTURE) IMPLANT
SUT VICRYL 4-0 PS2 18IN ABS (SUTURE) IMPLANT
SUTURE E-PAK OPEN HEART (SUTURE) ×2 IMPLANT
SYSTEM SAHARA CHEST DRAIN ATS (WOUND CARE) ×2 IMPLANT
TAPE CLOTH SURG 4X10 WHT LF (GAUZE/BANDAGES/DRESSINGS) ×1 IMPLANT
TOWEL OR 17X24 6PK STRL BLUE (TOWEL DISPOSABLE) ×4 IMPLANT
TOWEL OR 17X26 10 PK STRL BLUE (TOWEL DISPOSABLE) ×4 IMPLANT
TRAY FOLEY IC TEMP SENS 14FR (CATHETERS) ×2 IMPLANT
TUBE SUCT INTRACARD DLP 20F (MISCELLANEOUS) ×2 IMPLANT
TUBING INSUFFLATION 10FT LAP (TUBING) ×2 IMPLANT
UNDERPAD 30X30 INCONTINENT (UNDERPADS AND DIAPERS) ×2 IMPLANT
VENT LEFT HEART 12002 (CATHETERS)
WATER STERILE IRR 1000ML POUR (IV SOLUTION) ×4 IMPLANT

## 2011-12-27 NOTE — Progress Notes (Addendum)
Day of Surgery Procedure(s) (LRB): CORONARY ARTERY BYPASS GRAFTING (CABG) (N/A)  Subjective: Patient alert and awake.  Objective: Vital signs in last 24 hours: Patient Vitals for the past 24 hrs:  BP Temp Temp src Pulse Resp SpO2  12/27/11 2200 105/61 mmHg 96.8 F (36 C) Core 79  21  94 %  12/27/11 2130 94/60 mmHg 97 F (36.1 C) - 79  21  92 %  12/27/11 2125 - 97 F (36.1 C) - 79  27  89 %  12/27/11 2120 - 97 F (36.1 C) - 79  29  88 %  12/27/11 2110 - 97 F (36.1 C) - 79  27  93 %  12/27/11 2100 102/65 mmHg 97.2 F (36.2 C) Core 79  19  96 %  12/27/11 2048 - 97.3 F (36.3 C) - 80  15  96 %  12/27/11 2040 - 97.2 F (36.2 C) - 80  21  97 %  12/27/11 2009 - 97.3 F (36.3 C) - 79  21  98 %  12/27/11 2000 102/69 mmHg 97.3 F (36.3 C) Core 80  20  99 %  12/27/11 1940 - 97.3 F (36.3 C) - 79  25  98 %  12/27/11 1930 - 97.8 F (36.6 C) Oral - - -  12/27/11 1915 122/103 mmHg 97.3 F (36.3 C) - 79  20  98 %  12/27/11 1900 110/60 mmHg 97.3 F (36.3 C) - 80  20  97 %  12/27/11 1845 - 97.5 F (36.4 C) - 80  20  98 %  12/27/11 1838 108/65 mmHg 97.5 F (36.4 C) - 80  22  97 %  12/27/11 1830 - 97.7 F (36.5 C) - 79  22  98 %  12/27/11 1815 - 97.9 F (36.6 C) - 80  24  97 %  12/27/11 1802 108/65 mmHg 98.1 F (36.7 C) - 80  20  99 %  12/27/11 1800 - 98.1 F (36.7 C) - 80  19  99 %  12/27/11 1745 - 97.9 F (36.6 C) - 80  18  98 %  12/27/11 1736 115/70 mmHg 97.9 F (36.6 C) - 80  22  98 %  12/27/11 1730 - 97.9 F (36.6 C) - 80  16  99 %  12/27/11 1715 - 97.7 F (36.5 C) - 80  16  99 %  12/27/11 1700 115/70 mmHg 97.7 F (36.5 C) - 89  16  99 %  12/27/11 1645 - 97.7 F (36.5 C) - 90  15  99 %  12/27/11 1630 - 97.5 F (36.4 C) - 90  14  99 %  12/27/11 1615 - 97.7 F (36.5 C) - 89  12  98 %  12/27/11 1607 - 97.7 F (36.5 C) - 90  12  98 %  12/27/11 1600 110/69 mmHg 97.5 F (36.4 C) - 90  12  99 %  12/27/11 1545 - 97.5 F (36.4 C) - 90  12  99 %  12/27/11 1530 - 97.5  F (36.4 C) - 90  12  99 %  12/27/11 1515 - 97.5 F (36.4 C) - 90  14  99 %  12/27/11 1500 120/80 mmHg 97.3 F (36.3 C) - 90  12  99 %  12/27/11 1445 - 97.2 F (36.2 C) - 90  12  99 %  12/27/11 1430 - 96.8 F (36 C) - 90  12  99 %  12/27/11 1415 - 96.3 F (35.7 C) - 90  12  99 %  12/27/11 1400 115/74 mmHg 95.9 F (35.5 C) - 90  12  99 %  12/27/11 1345 - 95.5 F (35.3 C) - 90  13  97 %  12/27/11 1333 - 95.7 F (35.4 C) - 89  12  97 %  12/27/11 1330 - 95.7 F (35.4 C) - 89  16  97 %  12/27/11 0541 124/64 mmHg 97.5 F (36.4 C) Oral 75  18  98 %   Pre op weight 74.8  kg Current Weight  12/26/11 165 lb (74.844 kg)    Hemodynamic parameters for last 24 hours: PAP: (19-36)/(12-22) 35/19 mmHg CO:  [5.1 L/min-6.2 L/min] 6.2 L/min CI:  [2.7 L/min/m2-3.3 L/min/m2] 3.3 L/min/m2      Physical Exam:  Cardiovascular: RRR;rub present with chest tubes in place. Pulmonary: Rhonchi throughout; no wheezes. Abdomen: Soft, non tender, bowel sounds present. Extremities:No right lower extremity edema.Left lower extremity with ACE bandage. Wounds: Dressings clean and dry.   Lab Results: CBC: Basename 12/27/11 1953 12/27/11 1950 12/27/11 1330  WBC -- 14.6* 11.2*  HGB 10.9* 10.7* --  HCT 32.0* 32.6* --  PLT -- 115* 106*   BMET:  Basename 12/27/11 1953 12/27/11 1950 12/27/11 1336  NA 142 -- 144  K 4.3 -- 4.1  CL 115* -- --  CO2 -- -- --  GLUCOSE 169* -- 98  BUN 28* -- --  CREATININE 1.60* 1.35 --  CALCIUM -- -- --    PT/INR:  Basename 12/27/11 1330  LABPROT 16.9*  INR 1.35   ABG:  INR: Will add last result for INR, ABG once components are confirmed Will add last 4 CBG results once components are confirmed  Assessment/Plan:  1. CV - CI greater than 3.AAI paced 80. 2.  Pulmonary - Extubated shortly after 9 pm. On 50% Venti as is a bit groggy yet. 3.  Acute blood loss anemia - Last H/H 10.9/32. 4. Chest tube output 50 cc since 7 pm. 5.Mild ARI-Creatinine up to 1.6.  Good urine output.Monitor. 6.Continue routine post op care.  ZIMMERMAN,DONIELLE MPA-C 12/27/2011   Patient remained stable after multivessel bypass surgery earlier today A  paced with O2 sats 94-95% on mask Post extubation ABG satisfactory , hemoglobin 11 Doing well post CABG

## 2011-12-27 NOTE — OR Nursing (Signed)
Leg procedure start at 08:32, time out conducted with room staff and Ellwood Handler, PA prior to incision. Separate time out conducted with Dr. Roxy Manns prior to chest incision at 09:15.

## 2011-12-27 NOTE — Progress Notes (Signed)
Spoke with Anderson Malta RN infection control regarding contact precautions, negative MRSA no contact precautions needed, she will make adjustment to Epic "banner" in the AM.

## 2011-12-27 NOTE — Transfer of Care (Signed)
Immediate Anesthesia Transfer of Care Note  Patient: Bradley Black  Procedure(s) Performed: Procedure(s) (LRB): CORONARY ARTERY BYPASS GRAFTING (CABG) (N/A)  Patient Location: SICU  Anesthesia Type: General  Level of Consciousness: Patient remains intubated per anesthesia plan  Airway & Oxygen Therapy: Patient remains intubated per anesthesia plan and Patient placed on Ventilator (see vital sign flow sheet for setting)  Post-op Assessment: Post -op Vital signs reviewed and stable  Post vital signs: Reviewed and stable  Complications: No apparent anesthesia complications

## 2011-12-27 NOTE — H&P (Signed)
CARDIOTHORACIC SURGERY HISTORY AND PHYSICAL EXAM  PCP is Odette Fraction, MD, MD Referring Provider is [facility], Leonie Green, MD Primary Cardiologist is LITTLE, AL, MD    Chief Complaint   Patient presents with   .  Coronary Artery Disease       Referral from Dr Ellyn Hack for eval on 3V CAD, Cath'ed on 12/13/11     HPI:  Patient is a 66 year old retired white male from [place] with known history of coronary artery disease hypertension hyperlipidemia and type 2 diabetes mellitus who recently developed new onset symptoms of exertional angina. He was seen in the office by Dr. Rex Kras been scheduled for elective cardiac catheterization. This was performed 12/13/2011 by Dr. Ellyn Hack and the patient was found to have severe three-vessel coronary artery disease with mild left ventricular systolic dysfunction. The patient has been referred to consider elective coronary artery bypass grafting.   Past Medical History  Diagnosis Date  . Diabetes mellitus   . Hypertension   . Coronary artery disease   . Acute myocardial infarction 1988  . Hyperlipidemia   . Benign prostatic hypertrophy   . Back pain   . Cyst     right shoulder    Past Surgical History  Procedure Date  . Back surgery 1970  . Mass excision 10/24/11    R arm  . Lumbar fusion   . Coronary angioplasty 1994    OM  . Coronary angioplasty 1997    LAD  . Coronary angioplasty with stent placement 1999    RCA  . Coronary angioplasty with stent placement 2001    RCA  . Incision and drainage of wound 2006    axilla  . Hernia repair     Family History  Problem Relation Age of Onset  . Cancer Mother     breast  . Cancer Father     bone    Social History History  Substance Use Topics  . Smoking status: Former Smoker    Types: Cigarettes    Quit date: 10/01/1986  . Smokeless tobacco: Never Used   Comment: quit about 30 yrs ago  . Alcohol Use: No    Current Facility-Administered Medications    Medication Dose Route Frequency Provider Last Rate Last Dose  . aminocaproic acid (AMICAR) 10 g in sodium chloride 0.9 % 100 mL infusion   Intravenous To OR Rexene Alberts, MD      . cefUROXime (ZINACEF) 1.5 g in dextrose 5 % 50 mL IVPB  1.5 g Intravenous To OR Rexene Alberts, MD      . cefUROXime (ZINACEF) 750 mg in dextrose 5 % 50 mL IVPB  750 mg Intravenous To OR Rexene Alberts, MD      . dexmedetomidine (PRECEDEX) 400 mcg in sodium chloride 0.9 % 100 mL infusion  0.1-0.7 mcg/kg/hr Intravenous To OR Rexene Alberts, MD      . DOPamine (INTROPIN) 800 mg in dextrose 5 % 250 mL infusion  2-20 mcg/kg/min Intravenous To OR Rexene Alberts, MD      . EPINEPHrine (ADRENALIN) 4,000 mcg in dextrose 5 % 250 mL infusion  0.5-20 mcg/min Intravenous To OR Rexene Alberts, MD      . insulin regular (NOVOLIN R,HUMULIN R) 1 Units/mL in sodium chloride 0.9 % 100 mL infusion   Intravenous To OR Rexene Alberts, MD      . magnesium sulfate (IV Push/IM) injection 40 mEq  40  mEq Other To OR Rexene Alberts, MD      . metoprolol tartrate (LOPRESSOR) tablet 12.5 mg  12.5 mg Oral Once Rexene Alberts, MD      . nitroGLYCERIN 0.2 mg/mL in dextrose 5 % infusion  2-200 mcg/min Intravenous To OR Rexene Alberts, MD      . nitroglycerin/verapamil/heparin/sodium bicarbonate solution irrigation for artery spasm   Irrigation To OR Rexene Alberts, MD      . phenylephrine (NEO-SYNEPHRINE) 20,000 mcg in dextrose 5 % 250 mL infusion  30-200 mcg/min Intravenous To OR Rexene Alberts, MD      . potassium chloride injection 80 mEq  80 mEq Other To OR Rexene Alberts, MD      . vancomycin (VANCOCIN) 1,250 mg in sodium chloride 0.9 % 250 mL IVPB  1,250 mg Intravenous To OR Rexene Alberts, MD        No Known Allergies  Review of Systems:             General:                      normal appetite, normal energy             Respiratory:                no cough, no wheezing, no hemoptysis, no pain with inspiration or cough, no  shortness of breath               Cardiac:                      + exertional no chest pressure or tightness always relieved by rest, + mild exertional SOB, no resting SOB, no PND, no orthopnea, no LE edema, no palpitations, no syncope             GI:                                no difficulty swallowing, no hematochezia, no hematemesis, no melena, no constipation, no diarrhea               GU:                              no dysuria, no urgency, no frequency               Musculoskeletal:         no arthritis, no arthralgia               Vascular:                     no pain suggestive of claudication               Neuro:                         no symptoms suggestive of TIA's, no seizures, no headaches, no peripheral neuropathy               Endocrine:                   Negative, checks blood sugars regularly and reports good control             HEENT:  no loose teeth or painful teeth,  no recent vision changes             Psych:                         no anxiety, no depression                Physical Exam:              BP 153/73  Pulse 67  Resp 18  Ht 5\' 8"  (1.727 m)  Wt 165 lb (74.844 kg)  BMI 25.09 kg/m2  SpO2 98%             General:                        well-appearing             HEENT:                       Unremarkable               Neck:                           no JVD, no bruits, no adenopathy               Chest:                         clear to auscultation, symmetrical breath sounds, no wheezes, no rhonchi               CV:                              RRR, no  murmur               Abdomen:                    soft, non-tender, no masses               Extremities:                 warm, well-perfused, pulses not palpable             Rectal/GU                   Deferred             Neuro:                         Grossly non-focal and symmetrical throughout             Skin:                            Clean and dry, no rashes, no  breakdown   Diagnostic Tests:  [facility]   CARDIAC CATHETERIZATION REPORT   NAME: Bradley Black MRN: [id number]   DOB: April 11, 1946 ADMIT DATE: 12/13/2011   Performing Cardiologist: Leonie Man , MD, MS   Primary Physician: Odette Fraction, MD, MD   Primary Cardiologist: Chase Picket, MD   Procedures Performed:   Left Heart Catheterization via 5 Fr Right Radial Artery access     Left Ventriculography, RAO  11 ml/sec for 33 ml total contrast     Native Coronary Angiography Hemodynamics:   Central Aortic Pressure / Mean Aortic Pressure: 109/61 mmHg, 82 mmHg  LV Pressure / LV End diastolic Pressure: [id number] mHg, 15 mmHg   Left Ventriculography:   EF: 45-50%  Wall Motion: Mild Inferobasal hypokinesis   Coronary Angiographic Data:   Left Main: Moderate to large caliber, bifurcates to LAD & Left Circumflex     Left Anterior Descending (LAD): Moderate caliber vessel with an early mid-vessel focal 70-80% eccentric lesion involving the ostium of 2 proximal small caliber Diagonal branches that each have ~80-90% ostial lesions.     3rd diagonal (D3): Small to moderate caliber, minimal luminal irregularities     Distal LAD - there is a long, tubular 70% stenosis     Circumflex (LCx): Moderate caliber vessel, gives rise to diminutive OM1 that is diffusely disease prior to bifurcating into OM2 and AVGroove Circumflex     1st obtuse marginal: Proximal long diffusely diseased 60-70% lesion prior to its bifurcation, the superior branch has a ~80% lesion along with a ~60% lesion in the ostium of the inferior branch    AV Groove Branch: trifurcates into small posterolateral branches   Right Coronary Artery: Moderate to large caliber vessel with a widely patent proximal stent followed by a long, diffusely diseased 60-70% irregular portion. The distal RCA normalizes prior to bifurcating into the Posterior Descending Artery & the Posterior Atrioventricular branch  (RPAV)     posterior descending artery: small-moderate caliber, minimal luminal irregularities     posterior lateral system: the RPAV branches into to small-moderate caliber posterolateral branches with minimal luminal irregularities Impression:   Severe multivessel CAD involving both all 3 major coronary artery branches -- LAD (~proximal & distal), OM2, and mid RCA with a widely patent RCA stent.     All lesions are PCI amenable targets, but would result in a staged multivessel / multistent revascularization with at least 2 bifurcation lesions.     As a diabetic with multi-vessel disease, CABG is likely the best option for revascularization.     Mildly reduced Ejection Fraction with previously documented inferobasal hypokinesis.     Normal LVEDP  Leonie Man, M.D., M.S.    Impression:  Severe three-vessel coronary artery disease with mild left ventricular dysfunction. The patient presents with recent onset symptoms of exertional angina. I agree that he would best be treated with surgical revascularization.  Plan:  I've discussed the rationale for coronary artery bypass grafting as primary therapy for severe multivessel coronary artery disease.  The patient understands and accepts all potential associated risks of surgery including but not limited to risk of death, stroke, myocardial infarction, congestive heart failure, respiratory failure, renal failure, bleeding requiring blood transfusion and/or reexploration, arrhythmia, heart block or bradycardia requiring permanent pacemaker, pneumonia, pleural effusion, wound infection, pulmonary embolus or other thromboembolic complication, chronic pain or other delayed complications.  All questions answered.   We tentatively plan to proceed with surgery on March 28    [place]. Roxy Manns, MD

## 2011-12-27 NOTE — Anesthesia Preprocedure Evaluation (Addendum)
Anesthesia Evaluation  Patient identified by MRN, date of birth, ID band Patient awake    Reviewed: Allergy & Precautions, H&P , NPO status , Patient's Chart, lab work & pertinent test results, reviewed documented beta blocker date and time   Airway Mallampati: I TM Distance: >3 FB Neck ROM: Full    Dental  (+) Teeth Intact and Dental Advisory Given   Pulmonary former smoker breath sounds clear to auscultation  Pulmonary exam normal       Cardiovascular hypertension, Pt. on home beta blockers + angina with exertion + CAD and + Past MI Rhythm:Regular Rate:Normal  3 vessel disease, EF 45-50%, hypokinetic LV, normal LVEDP   Neuro/Psych    GI/Hepatic GERD-  Medicated,  Endo/Other  Diabetes mellitus-, Oral Hypoglycemic Agents  Renal/GU      Musculoskeletal   Abdominal   Peds  Hematology   Anesthesia Other Findings   Reproductive/Obstetrics                        Anesthesia Physical Anesthesia Plan  ASA: IV  Anesthesia Plan: General   Post-op Pain Management:    Induction: Intravenous  Airway Management Planned: Oral ETT  Additional Equipment: TEE, Arterial line, PA Cath and Ultrasound Guidance Line Placement  Intra-op Plan:   Post-operative Plan: Post-operative intubation/ventilation  Informed Consent: I have reviewed the patients History and Physical, chart, labs and discussed the procedure including the risks, benefits and alternatives for the proposed anesthesia with the patient or authorized representative who has indicated his/her understanding and acceptance.   Dental advisory given  Plan Discussed with: CRNA, Anesthesiologist and Surgeon  Anesthesia Plan Comments:        Anesthesia Quick Evaluation

## 2011-12-27 NOTE — Progress Notes (Signed)
Attempted weaning, patient unable to keep his head off pillow and or pass breathing tests for extubation, will delay as per protocol and attempt again with oncoming shift.  Pt allert but drowsy and unable to pass wean.  Pt heading states he is on contact precautions for MRSA but I cannot find any result that dictates this patient requires contact precautions. MRSA negative.  Will pass on to next shift.

## 2011-12-27 NOTE — Progress Notes (Signed)
Unable to document insulin rates for 1600 and 1700 rates are as follows, work order put in to solve the problem  1600 0.3  1700 0.5

## 2011-12-27 NOTE — OR Nursing (Signed)
Family contacted at 12:10 at removal of retractor and off pump. Unit 2300 contacted at 12:10, 12:43 and patient exit.

## 2011-12-27 NOTE — Preoperative (Signed)
Beta Blockers   Reason not to administer Beta Blockers:Not Applicable, pt took beta blocker 3/28

## 2011-12-27 NOTE — Op Note (Signed)
CARDIOTHORACIC SURGERY OPERATIVE NOTE  Date of Procedure: 12/27/2011  Preoperative Diagnosis:   Severe 3-vessel Coronary Artery Disease  Postoperative Diagnosis:   Same  Procedure:    Coronary Artery Bypass Grafting x 4   Left Internal Mammary Artery to Distal Left Anterior Descending Coronary Artery  Saphenous Vein Graft to Posterior Descending Coronary Artery  Saphenous Vein Graft to Second Obtuse Marginal Branch of Left Circumflex Coronary Artery  Saphenous Vein Graft to First Diagonal Branch Coronary Artery  Endoscopic Vein Harvest from Right Thigh and Lower Leg  Surgeon: Valentina Gu. Roxy Manns, MD  Assistant: Ellwood Handler, PA-C  Anesthesia: Tamela Gammon, MD  Operative Findings:  Mild left ventricular dysfunction  Good quality left internal mammary artery conduit  Good quality saphenous vein conduit  Fair to Good quality target vessels for grafting    BRIEF CLINICAL NOTE AND INDICATIONS FOR SURGERY   Patient is a 66 year old retired white male from [place] with known history of coronary artery disease hypertension hyperlipidemia and type 2 diabetes mellitus who recently developed new onset symptoms of exertional angina. He was seen in the office by Dr. Rex Kras been scheduled for elective cardiac catheterization. This was performed 12/13/2011 by Dr. Ellyn Hack and the patient was found to have severe three-vessel coronary artery disease with mild left ventricular systolic dysfunction. The patient has been referred to consider elective coronary artery bypass grafting.  The patient has been seen in consultation and counseled at length regarding the indications, risks and potential benefits of surgery.  All questions have been answered, and the patient provides full informed consent for the operation as described.    DETAILS OF THE OPERATIVE PROCEDURE  The patient is brought to the operating room on the above mentioned date and central monitoring was established  by the anesthesia team including placement of Swan-Ganz catheter and radial arterial line. The patient is placed in the supine position on the operating table.  Intravenous antibiotics are administered. General endotracheal anesthesia is induced uneventfully. A Foley catheter is placed.  Baseline transesophageal echocardiogram was performed.  Findings were notable for mild LV dysfunction.  The patient's chest, abdomen, both groins, and both lower extremities are prepared and draped in a sterile manner. A time out procedure is performed.  A median sternotomy incision was performed and the left internal mammary artery is dissected from the chest wall and prepared for bypass grafting. The left internal mammary artery is notably good quality conduit. Simultaneously, saphenous vein is obtained from the patient's right thigh and leg using endoscopic vein harvest technique. The saphenous vein is notably good quality conduit. After removal of the saphenous vein, the small surgical incisions in the lower extremity are closed with absorbable suture. Following systemic heparinization, the left internal mammary artery was transected distally noted to have excellent flow.  The pericardium is opened. The ascending aorta is normal in appearance. The ascending aorta and the right atrium are cannulated for cardioplegia bypass.  Adequate heparinization is verified.      The entire pre-bypass portion of the operation was notable for stable hemodynamics.  Cardiopulmonary bypass was begun and the surface of the heart is inspected. Distal target vessels are selected for coronary artery bypass grafting. There is old scar in the inferior wall of the LV.  A cardioplegia cannula is placed in the ascending aorta.  A temperature probe was placed in the interventricular septum.  The patient is allowed to cool passively to [facility] systemic temperature.  The aortic cross clamp is applied and cold blood  cardioplegia is delivered  initially in an antegrade fashion through the aortic root.   Iced saline slush is applied for topical hypothermia.  The initial cardioplegic arrest is rapid with early diastolic arrest.  Repeat doses of cardioplegia are administered intermittently throughout the entire cross clamp portion of the operation through the aortic root and through subsequently placed vein grafts in order to maintain completely flat electrocardiogram and septal myocardial temperature below 15C.  Myocardial protection was felt to be  excellent.  The following distal coronary artery bypass grafts were performed:   The posterior descending branch of the right coronary artery was grafted using a reversed saphenous vein graft in an end-to-side fashion.  At the site of distal anastomosis the target vessel was good quality and measured approximately 1.5 mm in diameter.  The second obtuse marginal branch of the left circumflex coronary artery was grafted using a reversed saphenous vein graft in an end-to-side fashion.  At the site of distal anastomosis the target vessel was good quality and measured approximately 1.5 mm in diameter.  The first diagonal branch of the left anterior descending coronary artery was grafted using a reversed saphenous vein graft in an end-to-side fashion.  At the site of distal anastomosis the target vessel was only fair quality and measured approximately 1.1 mm in diameter.  The distal left anterior coronary artery was grafted with the left internal mammary artery in an end-to-side fashion.  At the site of distal anastomosis the target vessel was good quality and measured approximately 1.5 mm in diameter.   All proximal vein graft anastomoses were placed directly to the ascending aorta prior to removal of the aortic cross clamp.  The septal myocardial temperature rose rapidly after reperfusion of the left internal mammary artery graft.  The aortic cross clamp was removed after a total cross clamp time of  80 minutes.  All proximal and distal coronary anastomoses were inspected for hemostasis and appropriate graft orientation. Epicardial pacing wires are fixed to the right ventricular outflow tract and to the right atrial appendage. The patient is rewarmed to 37C temperature. The patient is weaned and disconnected from cardiopulmonary bypass.  The patient's rhythm at separation from bypass was AV paced.  The patient was weaned from cardioplegic bypass without any inotropic support. Total cardiopulmonary bypass time for the operation was 97 minutes.  Followup transesophageal echocardiogram performed after separation from bypass revealed no changes from the preoperative exam.  The aortic and venous cannula were removed uneventfully. Protamine was administered to reverse the anticoagulation. The mediastinum and pleural space were inspected for hemostasis and irrigated with saline solution. The mediastinum and the left pleural space were drained using 3 chest tubes placed through separate stab incisions inferiorly.  The soft tissues anterior to the aorta were reapproximated loosely. The sternum is closed with double strength sternal wire. The soft tissues anterior to the sternum were closed in multiple layers and the skin is closed with a running subcuticular skin closure.   The post-bypass portion of the operation was notable for stable rhythm and hemodynamics.  The patient was transfused 2 units PRBC's after separation from bypass due to anemia that was present preoperatively and exacerbated by hemodilution and acute surgical blood loss.  The patient tolerated the procedure well and is transported to the surgical intensive care in stable condition. There are no intraoperative complications. All sponge instrument and needle counts are verified correct at completion of the operation.    Valentina Gu. Roxy Manns MD

## 2011-12-27 NOTE — Procedures (Signed)
Extubation Procedure Note  Patient Details:   Name: Bradley Black DOB: 01/24/1946 MRN: [id number]   Airway Documentation:   Patient extubated to 4 lpm nasal cannula.  VC 650 ml, NIF -30, able to hold head off bed 10 seconds.  Able to breathe around deflated cuff and vocalize post procedure.  No complications noted.   Evaluation  O2 sats: stable throughout Complications: No apparent complications Patient did tolerate procedure well. Bilateral Breath Sounds: Clear   Yes  Artist Bloom, Elwyn Lade 12/27/2011, 9:18 PM

## 2011-12-27 NOTE — Anesthesia Postprocedure Evaluation (Signed)
Anesthesia Post Note  Patient: Bradley Black  Procedure(s) Performed: Procedure(s) (LRB): CORONARY ARTERY BYPASS GRAFTING (CABG) (N/A)  Anesthesia type: General  Patient location: ICU  Post pain: Pain level controlled  Post assessment: Post-op Vital signs reviewed  Last Vitals:  Filed Vitals:   12/27/11 1345  BP:   Pulse: 90  Temp: 35.3 C  Resp: 13    Post vital signs: stable  Level of consciousness: Patient remains intubated per anesthesia plan  Complications: No apparent anesthesia complications

## 2011-12-27 NOTE — Progress Notes (Signed)
UR Completed.  Vergie Living [id number] 12/27/2011

## 2011-12-27 NOTE — Brief Op Note (Addendum)
12/27/2011  11:24 AM  PATIENT:  Bradley Black  65 y.o. male  PRE-OPERATIVE DIAGNOSIS:  Coronary Artery Disease  POST-OPERATIVE DIAGNOSIS:  Coronary Artery Disease  PROCEDURE:  Procedure(s) (LRB): CORONARY ARTERY BYPASS GRAFTING x4 LIMA to LAD, SVG to PD, SVG to OM2, SVG to Diagonal 1, Endoscopic Saphenous Vein Harvest Right Leg   SURGEON:  Surgeon(s) and Role:    * Rexene Alberts, MD - Primary  PHYSICIAN ASSISTANT: Ellwood Handler PA-C   ANESTHESIA:   general  EBL:  Total I/O In: 1000 [I.V.:1000] Out: 350 [Urine:350]  DRAINS: Chest tubes  PATIENT DISPOSITION:  ICU - intubated and hemodynamically stable.   [name] H 12/27/2011 12:50 PM

## 2011-12-27 NOTE — Interval H&P Note (Signed)
History and Physical Interval Note:  12/27/2011 7:48 AM  Bradley Black  has presented today for surgery, with the diagnosis of CAD  The various methods of treatment have been discussed with the patient and family. After consideration of risks, benefits and other options for treatment, the patient has consented to  Procedure(s) (LRB): CORONARY ARTERY BYPASS GRAFTING (CABG) (N/A) as a surgical intervention .  The patients' history has been reviewed, patient examined, no change in status, stable for surgery.  I have reviewed the patients' chart and labs.  Questions were answered to the patient's satisfaction.     [name] H

## 2011-12-28 ENCOUNTER — Inpatient Hospital Stay (HOSPITAL_COMMUNITY): Payer: Medicare Other

## 2011-12-28 LAB — POCT I-STAT, CHEM 8
BUN: 38 mg/dL — ABNORMAL HIGH (ref 6–23)
Calcium, Ion: 1.3 mmol/L (ref 1.12–1.32)
HCT: 34 % — ABNORMAL LOW (ref 39.0–52.0)
Sodium: 144 mEq/L (ref 135–145)
TCO2: 23 mmol/L (ref 0–100)

## 2011-12-28 LAB — GLUCOSE, CAPILLARY
Glucose-Capillary: 118 mg/dL — ABNORMAL HIGH (ref 70–99)
Glucose-Capillary: 136 mg/dL — ABNORMAL HIGH (ref 70–99)
Glucose-Capillary: 159 mg/dL — ABNORMAL HIGH (ref 70–99)

## 2011-12-28 LAB — POCT I-STAT 3, ART BLOOD GAS (G3+)
Bicarbonate: 21 mEq/L (ref 20.0–24.0)
O2 Saturation: 92 %
Patient temperature: 35.8
TCO2: 22 mmol/L (ref 0–100)
TCO2: 23 mmol/L (ref 0–100)
pCO2 arterial: 41.4 mmHg (ref 35.0–45.0)
pCO2 arterial: 48.8 mmHg — ABNORMAL HIGH (ref 35.0–45.0)
pH, Arterial: 7.257 — ABNORMAL LOW (ref 7.350–7.450)
pH, Arterial: 7.308 — ABNORMAL LOW (ref 7.350–7.450)

## 2011-12-28 LAB — BASIC METABOLIC PANEL
CO2: 21 mEq/L (ref 19–32)
Chloride: 107 mEq/L (ref 96–112)
Sodium: 140 mEq/L (ref 135–145)

## 2011-12-28 LAB — CBC
Hemoglobin: 10.8 g/dL — ABNORMAL LOW (ref 13.0–17.0)
MCH: 28.1 pg (ref 26.0–34.0)
MCHC: 33 g/dL (ref 30.0–36.0)
MCV: 83.9 fL (ref 78.0–100.0)
MCV: 85.1 fL (ref 78.0–100.0)
Platelets: 131 10*3/uL — ABNORMAL LOW (ref 150–400)
Platelets: 135 10*3/uL — ABNORMAL LOW (ref 150–400)
RBC: 3.98 MIL/uL — ABNORMAL LOW (ref 4.22–5.81)
RDW: 16 % — ABNORMAL HIGH (ref 11.5–15.5)
WBC: 13.7 10*3/uL — ABNORMAL HIGH (ref 4.0–10.5)

## 2011-12-28 LAB — CREATININE, SERUM: Creatinine, Ser: 1.88 mg/dL — ABNORMAL HIGH (ref 0.50–1.35)

## 2011-12-28 LAB — MAGNESIUM: Magnesium: 2.9 mg/dL — ABNORMAL HIGH (ref 1.5–2.5)

## 2011-12-28 MED ORDER — INSULIN ASPART 100 UNIT/ML ~~LOC~~ SOLN: 0.0000 [IU] | SUBCUTANEOUS | Status: DC

## 2011-12-28 MED ORDER — FUROSEMIDE 10 MG/ML IJ SOLN
80.0000 mg | Freq: Once | INTRAMUSCULAR | Status: AC
  Administered 2011-12-28: 80 mg via INTRAVENOUS
  Filled 2011-12-28: qty 8

## 2011-12-28 MED ORDER — FUROSEMIDE 10 MG/ML IJ SOLN
40.0000 mg | Freq: Once | INTRAMUSCULAR | Status: AC
  Administered 2011-12-28: 40 mg via INTRAVENOUS
  Filled 2011-12-28: qty 4

## 2011-12-28 MED ORDER — FUROSEMIDE 10 MG/ML IJ SOLN
20.0000 mg | Freq: Once | INTRAMUSCULAR | Status: AC
  Administered 2011-12-28: 20 mg via INTRAVENOUS
  Filled 2011-12-28: qty 2

## 2011-12-28 MED ORDER — TRAMADOL HCL 50 MG PO TABS
50.0000 mg | ORAL_TABLET | Freq: Four times a day (QID) | ORAL | Status: DC | PRN
  Filled 2011-12-28: qty 1

## 2011-12-28 MED FILL — Potassium Chloride Inj 2 mEq/ML: INTRAVENOUS | Qty: 40 | Status: AC

## 2011-12-28 MED FILL — Heparin Sodium (Porcine) Inj 1000 Unit/ML: INTRAMUSCULAR | Qty: 10 | Status: AC

## 2011-12-28 MED FILL — Nitroglycerin IV Soln 5 MG/ML: INTRAVENOUS | Qty: 10 | Status: AC

## 2011-12-28 MED FILL — Lactated Ringer's Solution: INTRAVENOUS | Qty: 500 | Status: AC

## 2011-12-28 MED FILL — Magnesium Sulfate Inj 50%: INTRAMUSCULAR | Qty: 2 | Status: AC

## 2011-12-28 MED FILL — Verapamil HCl IV Soln 2.5 MG/ML: INTRAVENOUS | Qty: 4 | Status: AC

## 2011-12-28 NOTE — Progress Notes (Addendum)
Dr. Prescott Gum made aware of increased PAP- 50's/30's, O2 sat 90-91% on venti mask 50%, and of minimal air movement ascultated in lungs. Order for 40mg  IV lasix received. Will place on NRB mask to increase O2 sats. Will continue to closely monitor. Richarda Blade RN

## 2011-12-28 NOTE — Progress Notes (Addendum)
1 Day Post-Op Procedure(s) (LRB): CORONARY ARTERY BYPASS GRAFTING (CABG) (N/A) Subjective: Status post CABG for severe three-vessel disease History diabetes oral meds, COPD  Objective: Vital signs in last 24 hours: Temp:  [95.5 F (35.3 C)-98.1 F (36.7 C)] 97.5 F (36.4 C) (03/29 0946) Pulse Rate:  [79-90] 80  (03/29 0946) Cardiac Rhythm:  [-] Atrial paced (03/29 0900) Resp:  [11-29] 25  (03/29 0946) BP: (94-122)/(54-103) 120/58 mmHg (03/29 0916) SpO2:  [88 %-100 %] 93 % (03/29 0946) Arterial Line BP: (85-153)/(49-83) 100/76 mmHg (03/29 0300) FiO2 (%):  [35 %-100 %] 100 % (03/29 0900) Weight:  [162 lb 11.2 oz (73.8 kg)] 162 lb 11.2 oz (73.8 kg) (03/29 0400)  Hemodynamic parameters for last 24 hours: PAP: (19-55)/(10-33) 44/20 mmHg CO:  [5.1 L/min-7.3 L/min] 6.2 L/min CI:  [2.7 L/min/m2-3.9 L/min/m2] 3.3 L/min/m2  Intake/Output from previous day: 03/28 0701 - 03/29 0700 In: 3704 [I.V.:2380; Blood:870; NG/GT:30; IV Piggyback:424] Out: 2280 [Urine:1805; Emesis/NG output:30; Chest Tube:445] Intake/Output this shift: Total I/O In: 80 [I.V.:80] Out: 225 [Urine:125; Chest Tube:100]  Exam Breath sounds distant but clear Sinus rhythm Extremities warm Neuro intact  Lab Results:  Basename 12/28/11 0428 12/27/11 1953 12/27/11 1950  WBC 13.7* -- 14.6*  HGB 10.8* 10.9* --  HCT 33.4* 32.0* --  PLT 131* -- 115*   BMET:  Basename 12/28/11 0428 12/27/11 1953  NA 140 142  K 4.8 4.3  CL 107 115*  CO2 21 --  GLUCOSE 183* 169*  BUN 31* 28*  CREATININE 1.59* 1.60*  CALCIUM 8.9 --    PT/INR:  Basename 12/27/11 1330  LABPROT 16.9*  INR 1.35   ABG    Component Value Date/Time   PHART 7.257* 12/28/2011 1008   HCO3 21.7 12/28/2011 1008   TCO2 23 12/28/2011 1008   ACIDBASEDEF 5.0* 12/28/2011 1008   O2SAT 90.0 12/28/2011 1008   CBG (last 3)   Basename 12/28/11 0730 12/28/11 0338 12/28/11 0013  GLUCAP 159* 173* 150*    Assessment/Plan: S/P Procedure(s) (LRB): CORONARY  ARTERY BYPASS GRAFTING (CABG) (N/A)  Plan Patient with low O2 sats with past  smoking history, having postop chest wall pain, chest x-ray fairly clear but with low lung volumes, PO2 this a.m. 66 PCO2 49 on 100% mask Will place patient on BiPAP, continue nebs, IV Lasix ordered, hold IV Toradol the to elevated creatinine and history diabetes, start by mouth Ultram   LOS: 1 day    VAN TRIGT III,Hanae Waiters 12/28/2011

## 2011-12-28 NOTE — Progress Notes (Signed)
   CARE MANAGEMENT NOTE 12/28/2011  Patient:  Bradley Black, Bradley Black   Account Number:  [id number]  Date Initiated:  12/27/2011  Documentation initiated by:  [facility]  Subjective/Objective Assessment:   CABG x4 -  has spouse.     Action/Plan:   PTA, PT INDEPENDENT, LIVES WITH WIFE.  HE STATES SHE WILL PROVIDE 24HR CARE AT DISCHARGE.   Anticipated DC Date:  01/01/2012   Anticipated DC Plan:  Box  CM consult      Choice offered to / List presented to:             Status of service:  In process, will continue to follow Medicare Important Message given?   (If response is "NO", the following Medicare IM given date fields will be blank) Date Medicare IM given:   Date Additional Medicare IM given:    Discharge Disposition:    Per UR Regulation:  Reviewed for med. necessity/level of care/duration of stay  If discussed at Long Length of Stay Meetings, dates discussed:    Comments:  12/28/11 Dres[place] Lozito,RN,BSN 1530 WILL FOLLOW FOR HOME NEEDS AS PT PROGRESSES. Phone [phone]

## 2011-12-28 NOTE — Progress Notes (Signed)
Pt noted to have crepitus to left side of sternal incision.  MD notified and orders obtained to maintain Lt pleural at this time.

## 2011-12-28 NOTE — Progress Notes (Signed)
Patient ID: Bradley Black, male   DOB: [birth year], 66 y.o.   MRN: [id number]   Filed Vitals:   12/28/11 1600 12/28/11 1603 12/28/11 1700 12/28/11 1800  BP: 121/99  120/69 115/70  Pulse: 80  79 79  Temp:  97.3 F (36.3 C)    TempSrc:  Oral    Resp: 15  16 10   Weight:      SpO2: 92%  94% 96%   On 70% bipap with sats 95%  Urine output ok.  Some diuresis with 80mg  lasix today but not brisk.    BMET    Component Value Date/Time   NA 144 12/28/2011 1737   K 4.6 12/28/2011 1737   CL 112 12/28/2011 1737   CO2 21 12/28/2011 0428   GLUCOSE 131* 12/28/2011 1737   BUN 38* 12/28/2011 1737   CREATININE 2.20* 12/28/2011 1737   CALCIUM 8.9 12/28/2011 0428   GFRNONAA 35* 12/28/2011 1700   GFRAA 41* 12/28/2011 1700    CBC    Component Value Date/Time   WBC 13.4* 12/28/2011 1700   RBC 3.95* 12/28/2011 1700   HGB 11.6* 12/28/2011 1737   HCT 34.0* 12/28/2011 1737   PLT 135* 12/28/2011 1700   MCV 85.1 12/28/2011 1700   MCH 28.1 12/28/2011 1700   MCHC 33.0 12/28/2011 1700   RDW 16.0* 12/28/2011 1700    A/P:  Hypoxemia requiring bipap due to shallow breathing from pain.  Seems to be improving some.

## 2011-12-29 ENCOUNTER — Inpatient Hospital Stay (HOSPITAL_COMMUNITY): Payer: Medicare Other

## 2011-12-29 LAB — BASIC METABOLIC PANEL
BUN: 38 mg/dL — ABNORMAL HIGH (ref 6–23)
CO2: 24 mEq/L (ref 19–32)
Calcium: 9.6 mg/dL (ref 8.4–10.5)
Chloride: 109 mEq/L (ref 96–112)
Creatinine, Ser: 1.93 mg/dL — ABNORMAL HIGH (ref 0.50–1.35)
GFR calc Af Amer: 39 mL/min — ABNORMAL LOW (ref >90)
GFR calc non Af Amer: 34 mL/min — ABNORMAL LOW (ref >90)
Glucose, Bld: 99 mg/dL (ref 70–99)
Potassium: 4.4 mEq/L (ref 3.5–5.1)
Sodium: 143 mEq/L (ref 135–145)

## 2011-12-29 LAB — CBC
HCT: 33.8 % — ABNORMAL LOW (ref 39.0–52.0)
Hemoglobin: 10.9 g/dL — ABNORMAL LOW (ref 13.0–17.0)
MCH: 27.4 pg (ref 26.0–34.0)
MCHC: 32.2 g/dL (ref 30.0–36.0)
MCV: 84.9 fL (ref 78.0–100.0)
Platelets: 141 10*3/uL — ABNORMAL LOW (ref 150–400)
RBC: 3.98 MIL/uL — ABNORMAL LOW (ref 4.22–5.81)
RDW: 16.3 % — ABNORMAL HIGH (ref 11.5–15.5)
WBC: 12.7 10*3/uL — ABNORMAL HIGH (ref 4.0–10.5)

## 2011-12-29 LAB — GLUCOSE, CAPILLARY: Glucose-Capillary: 157 mg/dL — ABNORMAL HIGH (ref 70–99)

## 2011-12-29 MED ORDER — ALBUTEROL SULFATE (5 MG/ML) 0.5% IN NEBU
2.5000 mg | INHALATION_SOLUTION | Freq: Four times a day (QID) | RESPIRATORY_TRACT | Status: DC
  Administered 2011-12-29 – 2011-12-31 (×6): 2.5 mg via RESPIRATORY_TRACT
  Filled 2011-12-29 (×6): qty 0.5

## 2011-12-29 MED ORDER — GUAIFENESIN ER 600 MG PO TB12
1200.0000 mg | ORAL_TABLET | Freq: Two times a day (BID) | ORAL | Status: DC
  Administered 2011-12-29 – 2011-12-30 (×2): 1200 mg via ORAL
  Administered 2011-12-30: 600 mg via ORAL
  Administered 2011-12-31 – 2012-01-02 (×5): 1200 mg via ORAL
  Filled 2011-12-29 (×9): qty 2

## 2011-12-29 MED ORDER — MOXIFLOXACIN HCL IN NACL 400 MG/250ML IV SOLN
400.0000 mg | INTRAVENOUS | Status: DC
  Administered 2011-12-29: 400 mg via INTRAVENOUS
  Filled 2011-12-29 (×2): qty 250

## 2011-12-29 MED ORDER — ENOXAPARIN SODIUM 40 MG/0.4ML ~~LOC~~ SOLN
40.0000 mg | SUBCUTANEOUS | Status: DC
  Administered 2011-12-29 – 2012-01-01 (×4): 40 mg via SUBCUTANEOUS
  Filled 2011-12-29 (×6): qty 0.4

## 2011-12-29 MED ORDER — ACETYLCYSTEINE 10 % IN SOLN
2.0000 mL | Freq: Four times a day (QID) | RESPIRATORY_TRACT | Status: DC
  Filled 2011-12-29 (×5): qty 4

## 2011-12-29 MED ORDER — ACETYLCYSTEINE 20 % IN SOLN
2.0000 mL | Freq: Four times a day (QID) | RESPIRATORY_TRACT | Status: DC
  Administered 2011-12-29 – 2011-12-30 (×2): 2 mL via RESPIRATORY_TRACT
  Administered 2011-12-30 (×2): via RESPIRATORY_TRACT
  Administered 2011-12-30 – 2011-12-31 (×3): 2 mL via RESPIRATORY_TRACT
  Filled 2011-12-29 (×9): qty 4

## 2011-12-29 NOTE — Progress Notes (Signed)
2 Days Post-Op Procedure(s) (LRB): CORONARY ARTERY BYPASS GRAFTING (CABG) (N/A) Subjective: No complaints  Objective: Vital signs in last 24 hours: Temp:  [97.3 F (36.3 C)-98.5 F (36.9 C)] 97.8 F (36.6 C) (03/30 1511) Pulse Rate:  [79-81] 80  (03/30 1500) Cardiac Rhythm:  [-] Atrial paced (03/30 0733) Resp:  [9-20] 18  (03/30 1500) BP: (95-134)/(60-99) 125/66 mmHg (03/30 1500) SpO2:  [88 %-98 %] 93 % (03/30 1500) FiO2 (%):  [50 %-70 %] 50 % (03/30 1400) Weight:  [73.5 kg (162 lb 0.6 oz)] 73.5 kg (162 lb 0.6 oz) (03/30 0500)  Hemodynamic parameters for last 24 hours:    Intake/Output from previous day: 03/29 0701 - 03/30 0700 In: 510 [I.V.:500; IV Piggyback:10] Out: 2635 [Urine:2295; Chest Tube:340] Intake/Output this shift: Total I/O In: 810 [P.O.:600; I.V.:160; IV Piggyback:50] Out: 280 [Urine:280]  General appearance: alert and cooperative Heart: regular rate and rhythm, S1, S2 normal, no murmur, click, rub or gallop Lungs: rhonchi bilaterally Extremities: extremities normal, atraumatic, no cyanosis or edema Wound: ok  Lab Results:  Basename 12/29/11 0400 12/28/11 1737 12/28/11 1700  WBC 12.7* -- 13.4*  HGB 10.9* 11.6* --  HCT 33.8* 34.0* --  PLT 141* -- 135*   BMET:  Basename 12/29/11 0400 12/28/11 1737 12/28/11 0428  NA 143 144 --  K 4.4 4.6 --  CL 109 112 --  CO2 24 -- 21  GLUCOSE 99 131* --  BUN 38* 38* --  CREATININE 1.93* 2.20* --  CALCIUM 9.6 -- 8.9    PT/INR:  Basename 12/27/11 1330  LABPROT 16.9*  INR 1.35   ABG    Component Value Date/Time   PHART 7.257* 12/28/2011 1008   HCO3 21.7 12/28/2011 1008   TCO2 23 12/28/2011 1737   ACIDBASEDEF 5.0* 12/28/2011 1008   O2SAT 90.0 12/28/2011 1008   CBG (last 3)   Basename 12/29/11 1148 12/29/11 0710 12/29/11 0348  GLUCAP 149* 119* 99   CXR:  Bilateral lower lobe atelectasis unchanged Assessment/Plan: S/P Procedure(s) (LRB): CORONARY ARTERY BYPASS GRAFTING (CABG) (N/A) Hemodynamically  stable Postop hypoxemia due to bilateral lower lobe atelectasis. Continue IS.  Add flutter valve. Will add Ventolin with mucomyst and guaifenesin.  Bipap as needed Acute postop renal failure:  Improving. Expected acute blood loss anemia: observe Start lovenox for DVT prophylaxis. Keep foley in for now to monior urine output with elevated creatinine.  LOS: 2 days    Bradley Black K 12/29/2011

## 2011-12-30 ENCOUNTER — Inpatient Hospital Stay (HOSPITAL_COMMUNITY): Payer: Medicare Other

## 2011-12-30 LAB — CBC
HCT: 31.6 % — ABNORMAL LOW (ref 39.0–52.0)
Hemoglobin: 10.1 g/dL — ABNORMAL LOW (ref 13.0–17.0)
MCV: 84.9 fL (ref 78.0–100.0)
RBC: 3.72 MIL/uL — ABNORMAL LOW (ref 4.22–5.81)
RDW: 16.2 % — ABNORMAL HIGH (ref 11.5–15.5)
WBC: 9.5 10*3/uL (ref 4.0–10.5)

## 2011-12-30 LAB — GLUCOSE, CAPILLARY
Glucose-Capillary: 82 mg/dL (ref 70–99)
Glucose-Capillary: 93 mg/dL (ref 70–99)

## 2011-12-30 LAB — BASIC METABOLIC PANEL
CO2: 24 mEq/L (ref 19–32)
Chloride: 107 mEq/L (ref 96–112)
GFR calc Af Amer: 43 mL/min — ABNORMAL LOW (ref >90)
Potassium: 4.3 mEq/L (ref 3.5–5.1)
Sodium: 140 mEq/L (ref 135–145)

## 2011-12-30 MED ORDER — MOXIFLOXACIN HCL 400 MG PO TABS
400.0000 mg | ORAL_TABLET | Freq: Every day | ORAL | Status: DC
  Administered 2011-12-30 – 2012-01-01 (×3): 400 mg via ORAL
  Filled 2011-12-30 (×4): qty 1

## 2011-12-30 NOTE — Progress Notes (Signed)
3 Days Post-Op Procedure(s) (LRB): CORONARY ARTERY BYPASS GRAFTING (CABG) (N/A) Subjective: No complaints.  Feeling better  Objective: Vital signs in last 24 hours: Temp:  [97.5 F (36.4 C)-98.7 F (37.1 C)] 98.4 F (36.9 C) (03/31 1113) Pulse Rate:  [79-83] 79  (03/31 1500) Cardiac Rhythm:  [-] Atrial paced (03/31 0741) Resp:  [8-17] 13  (03/31 1500) BP: (97-150)/(60-91) 135/68 mmHg (03/31 1500) SpO2:  [92 %-97 %] 96 % (03/31 1500) On 2l [place]  Hemodynamic parameters for last 24 hours:    Intake/Output from previous day: 03/30 0701 - 03/31 0700 In: 1500 [P.O.:780; I.V.:420; IV Piggyback:300] Out: 950 [Urine:830; Chest Tube:120] Intake/Output this shift: Total I/O In: 780 [P.O.:780] Out: 475 [Urine:425; Chest Tube:50]  General appearance: alert and cooperative Heart: regular rate and rhythm, S1, S2 normal, no murmur, click, rub or gallop Lungs: clear to auscultation bilaterally, decreased in bases but improved from yesterday Extremities: extremities normal, atraumatic, no cyanosis or edema Wound: ok  Lab Results:  Basename 12/30/11 0411 12/29/11 0400  WBC 9.5 12.7*  HGB 10.1* 10.9*  HCT 31.6* 33.8*  PLT 122* 141*   BMET:  Basename 12/30/11 0411 12/29/11 0400  NA 140 143  Black 4.3 4.4  CL 107 109  CO2 24 24  GLUCOSE 91 99  BUN 45* 38*  CREATININE 1.80* 1.93*  CALCIUM 9.4 9.6    PT/INR: No results found for this basename: LABPROT,INR in the last 72 hours ABG    Component Value Date/Time   PHART 7.257* 12/28/2011 1008   HCO3 21.7 12/28/2011 1008   TCO2 23 12/28/2011 1737   ACIDBASEDEF 5.0* 12/28/2011 1008   O2SAT 90.0 12/28/2011 1008   CBG (last 3)   Basename 12/30/11 1115 12/30/11 0738 12/30/11 0340  GLUCAP 148* 111* 82   CXR:  Improved aeration   Assessment/Plan: S/P Procedure(s) (LRB): CORONARY ARTERY BYPASS GRAFTING (CABG) (N/A) Improving aeration and oxygenation.  Continue nebs, mucolytics, IS, mobilization. Change Avelox to po DC chest tube, foley  and sleeve.   LOS: 3 days    Bradley Black 12/30/2011

## 2011-12-30 NOTE — Plan of Care (Signed)
Problem: Phase III Progression Outcomes Goal: Advance to regular diet without nausea Remains of full liquids d/t pulmonary status and may need BIPAP, EST'ing

## 2011-12-31 ENCOUNTER — Inpatient Hospital Stay (HOSPITAL_COMMUNITY): Payer: Medicare Other

## 2011-12-31 LAB — CULTURE, RESPIRATORY W GRAM STAIN

## 2011-12-31 LAB — TYPE AND SCREEN
ABO/RH(D): B POS
Unit division: 0
Unit division: 0

## 2011-12-31 LAB — GLUCOSE, CAPILLARY: Glucose-Capillary: 128 mg/dL — ABNORMAL HIGH (ref 70–99)

## 2011-12-31 MED ORDER — INSULIN ASPART 100 UNIT/ML ~~LOC~~ SOLN
0.0000 [IU] | Freq: Three times a day (TID) | SUBCUTANEOUS | Status: DC
  Administered 2011-12-31: 4 [IU] via SUBCUTANEOUS
  Administered 2011-12-31: 12 [IU] via SUBCUTANEOUS
  Administered 2012-01-01: 2 [IU] via SUBCUTANEOUS
  Administered 2012-01-01 (×2): 4 [IU] via SUBCUTANEOUS
  Administered 2012-01-02: 2 [IU] via SUBCUTANEOUS

## 2011-12-31 MED ORDER — GLYBURIDE 5 MG PO TABS: 5.0000 mg | ORAL_TABLET | Freq: Every day | ORAL | Status: DC

## 2011-12-31 MED ORDER — SODIUM CHLORIDE 0.9 % IV SOLN: 250.0000 mL | INTRAVENOUS | Status: DC | PRN

## 2011-12-31 MED ORDER — MOVING RIGHT ALONG BOOK
Freq: Once | Status: AC
  Administered 2011-12-31: 13:00:00
  Filled 2011-12-31: qty 1

## 2011-12-31 MED ORDER — SODIUM CHLORIDE 0.9 % IJ SOLN
3.0000 mL | Freq: Two times a day (BID) | INTRAMUSCULAR | Status: DC
  Administered 2011-12-31 – 2012-01-01 (×4): 3 mL via INTRAVENOUS

## 2011-12-31 MED ORDER — SODIUM CHLORIDE 0.9 % IJ SOLN: 3.0000 mL | INTRAMUSCULAR | Status: DC | PRN

## 2011-12-31 MED ORDER — ALBUTEROL SULFATE (5 MG/ML) 0.5% IN NEBU
2.5000 mg | INHALATION_SOLUTION | Freq: Four times a day (QID) | RESPIRATORY_TRACT | Status: DC | PRN
  Administered 2011-12-31: 2.5 mg via RESPIRATORY_TRACT
  Filled 2011-12-31: qty 0.5

## 2011-12-31 MED ORDER — ZOLPIDEM TARTRATE 5 MG PO TABS: 5.0000 mg | ORAL_TABLET | Freq: Every evening | ORAL | Status: DC | PRN

## 2011-12-31 NOTE — Progress Notes (Signed)
Pt transferred to 2001 via wheelchair, while on telemetry. Tolerated well. Report given to [facility].

## 2011-12-31 NOTE — Progress Notes (Signed)
CARDIAC REHAB PHASE I   PRE:  Rate/Rhythm: 82SR  BP:  Supine: 150/84  Sitting:   Standing:    SaO2: 97%2L  MODE:  Ambulation: 340 ft   POST:  Rate/Rhythem: 115ST  BP:  Supine:   Sitting: 136/80  Standing:    SaO2: 94%2L hall, 93% room 1330-1405 Pt walked 340 ft on 2L with rolling walker and asst x 2. Can be asst x 1 with walker. Tolerated well. To bathroom after walk. To recliner after walk. Decreased oxygen to 1L.  Bradley Black

## 2011-12-31 NOTE — Progress Notes (Signed)
4 Days Post-Op Procedure(s) (LRB): CORONARY ARTERY BYPASS GRAFTING (CABG) (N/A) Subjective: No c/o this AM  Objective: Vital signs in last 24 hours: Temp:  [97.9 F (36.6 C)-98.4 F (36.9 C)] 97.9 F (36.6 C) (04/01 0719) Pulse Rate:  [67-83] 75  (04/01 0700) Cardiac Rhythm:  [-] Normal sinus rhythm (04/01 0700) Resp:  [11-20] 14  (04/01 0700) BP: (104-163)/(60-85) 137/71 mmHg (04/01 0700) SpO2:  [92 %-97 %] 93 % (04/01 0700) Weight:  [161 lb 2.5 oz (73.1 kg)] 161 lb 2.5 oz (73.1 kg) (04/01 0600)  Hemodynamic parameters for last 24 hours:    Intake/Output from previous day: 03/31 0701 - 04/01 0700 In: 1020 [P.O.:1020] Out: 915 [Urine:865; Chest Tube:50] Intake/Output this shift:    General appearance: alert and no distress Neurologic: intact Heart: regular rate and rhythm Lungs: diminished breath sounds bibasilar Abdomen: normal findings: soft, non-tender Wound: clean and dry  Lab Results:  Basename 12/30/11 0411 12/29/11 0400  WBC 9.5 12.7*  HGB 10.1* 10.9*  HCT 31.6* 33.8*  PLT 122* 141*   BMET:  Basename 12/30/11 0411 12/29/11 0400  NA 140 143  K 4.3 4.4  CL 107 109  CO2 24 24  GLUCOSE 91 99  BUN 45* 38*  CREATININE 1.80* 1.93*  CALCIUM 9.4 9.6    PT/INR: No results found for this basename: LABPROT,INR in the last 72 hours ABG    Component Value Date/Time   PHART 7.257* 12/28/2011 1008   HCO3 21.7 12/28/2011 1008   TCO2 23 12/28/2011 1737   ACIDBASEDEF 5.0* 12/28/2011 1008   O2SAT 90.0 12/28/2011 1008   CBG (last 3)   Basename 12/31/11 0720 12/31/11 0408 12/30/11 2324  GLUCAP 128* 93 121*    Assessment/Plan: S/P Procedure(s) (LRB): CORONARY ARTERY BYPASS GRAFTING (CABG) (N/A) Plan for transfer to step-down: see transfer orders cv- stable resp- continue is Renal- creatinine improving CBG OK Transfer to 2000    LOS: 4 days    Bradley Black C 12/31/2011

## 2011-12-31 NOTE — Progress Notes (Signed)
UR Completed.  Bradley Black [id number] 12/31/2011

## 2012-01-01 ENCOUNTER — Encounter (HOSPITAL_COMMUNITY): Payer: Self-pay | Admitting: Thoracic Surgery (Cardiothoracic Vascular Surgery)

## 2012-01-01 ENCOUNTER — Inpatient Hospital Stay (HOSPITAL_COMMUNITY): Payer: Medicare Other

## 2012-01-01 LAB — BASIC METABOLIC PANEL
BUN: 34 mg/dL — ABNORMAL HIGH (ref 6–23)
CO2: 23 mEq/L (ref 19–32)
Calcium: 9.5 mg/dL (ref 8.4–10.5)
Creatinine, Ser: 1.36 mg/dL — ABNORMAL HIGH (ref 0.50–1.35)
Glucose, Bld: 126 mg/dL — ABNORMAL HIGH (ref 70–99)
Sodium: 142 mEq/L (ref 135–145)

## 2012-01-01 LAB — CBC
HCT: 34.3 % — ABNORMAL LOW (ref 39.0–52.0)
Hemoglobin: 11.2 g/dL — ABNORMAL LOW (ref 13.0–17.0)
MCH: 27.7 pg (ref 26.0–34.0)
MCV: 84.7 fL (ref 78.0–100.0)
RBC: 4.05 MIL/uL — ABNORMAL LOW (ref 4.22–5.81)

## 2012-01-01 LAB — GLUCOSE, CAPILLARY: Glucose-Capillary: 98 mg/dL (ref 70–99)

## 2012-01-01 MED ORDER — OXYCODONE HCL 5 MG PO TABS: 5.0000 mg | ORAL_TABLET | ORAL | Status: AC | PRN

## 2012-01-01 MED ORDER — METOPROLOL TARTRATE 12.5 MG HALF TABLET
12.5000 mg | ORAL_TABLET | Freq: Two times a day (BID) | ORAL | Status: DC
  Administered 2012-01-01 – 2012-01-02 (×3): 12.5 mg via ORAL
  Filled 2012-01-01 (×4): qty 1

## 2012-01-01 MED ORDER — MOXIFLOXACIN HCL 400 MG PO TABS: 400.0000 mg | ORAL_TABLET | Freq: Every day | ORAL | Status: AC

## 2012-01-01 MED ORDER — TRAMADOL HCL 50 MG PO TABS: 50.0000 mg | ORAL_TABLET | Freq: Four times a day (QID) | ORAL | Status: AC | PRN

## 2012-01-01 MED ORDER — GUAIFENESIN ER 600 MG PO TB12: 1200.0000 mg | ORAL_TABLET | Freq: Two times a day (BID) | ORAL | Status: AC

## 2012-01-01 NOTE — Progress Notes (Signed)
EPW dc'd intact per order and protocol pt tolerated well , Frequent vital sign regimen started. Pt instructed bedrest for one hour.

## 2012-01-01 NOTE — Discharge Instructions (Signed)
Coronary Artery Bypass Grafting Care After Refer to this sheet in the next few weeks. These instructions provide you with information on caring for yourself after your procedure. Your caregiver may also give you more specific instructions. Your treatment has been planned according to current medical practices, but problems sometimes occur. Call your caregiver if you have any problems or questions after your procedure.  Recovery from open heart surgery will be different for everyone. Some people feel well after 3 or 4 weeks, while for others it takes longer. After heart surgery, it may be normal to:  Not have an appetite, feel nauseated by the smell of food, or only want to eat a small amount.   Be constipated because of changes in your diet, activity, and medicines. Eat foods high in fiber. Add fresh fruits and vegetables to your diet. Stool softeners may be helpful.   Feel sad or unhappy. You may be frustrated or cranky. You may have good days and bad days. Do not give up. Talk to your caregiver if you do not feel better.   Feel weakness and fatigue. You many need physical therapy or cardiac rehabilitation to get your strength back.   Develop an irregular heartbeat called atrial fibrillation. Symptoms of atrial fibrillation are a fast, irregular heartbeat or feelings of fluttery heartbeats, shortness of breath, low blood pressure, and dizziness. If these symptoms develop, see your caregiver right away.  MEDICATION  Have a list of all the medicines you will be taking when you leave the hospital. For every medicine, know the following:   Name.   Exact dose.   Time of day to be taken.   How often it should be taken.   Why you are taking it.   Ask which medicines should or should not be taken together. If you take more than one heart medicine, ask if it is okay to take them together. Some heart medicines should not be taken at the same time because they may lower your blood pressure too  much.   Narcotic pain medicine can cause constipation. Eat fresh fruits and vegetables. Add fiber to your diet. Stool softener medicine may help relieve constipation.   Keep a copy of your medicines with you at all times.   Do not add or stop taking any medicine until you check with your caregiver.   Medicines can have side effects. Call your caregiver who prescribed the medicine if you:   Start throwing up, have diarrhea, or have stomach pain.   Feel dizzy or lightheaded when you stand up.   Feel your heart is skipping beats or is beating too fast or too slow.   Develop a rash.   Notice unusual bruising or bleeding.  HOME CARE INSTRUCTIONS  After heart surgery, it is important to learn how to take your pulse. Have your caregiver show you how to take your pulse.   Use your incentive spirometer. Ask your caregiver how long after surgery you need to use it.  Care of your chest incision  Tell your caregiver right away if you notice clicking in your chest (sternum).   Support your chest with a pillow or your arms when you take deep breaths and cough.   Follow your caregiver's instructions about when you can bathe or swim.   Protect your incision from sunlight during the first year to keep the scar from getting dark.   Tell your caregiver if you notice:   Increased tenderness of your incision.   Increased redness  or swelling around your incision.   Drainage or pus from your incision.  Care of your leg incision(s)  Avoid crossing your legs.   Avoid sitting for long periods of time. Change positions every half hour.   Elevate your leg(s) when you are sitting.   Check your leg(s) daily for swelling. Check the incisions for redness or drainage.   Wear your elastic stockings as told by your caregiver. Take them off at bedtime.  Diet  Diet is very important to heart health.   Eat plenty of fresh fruits and vegetables. Meats should be lean cut. Avoid canned, processed, and  fried foods.   Talk to a dietician. They can teach you how to make healthy food and drink choices.  Weight  Weigh yourself every day. This is important because it helps to know if you are retaining fluid that may make your heart and lungs work harder.   Use the same scale each time.   Weigh yourself every morning at the same time. You should do this after you go to the bathroom, but before you eat breakfast.   Your weight will be more accurate if you do not wear any clothes.   Record your weight.   Tell your caregiver if you have gained 2 pounds or more overnight.  Activity Stop any activity at once if you have chest pain, shortness of breath, irregular heartbeats, or dizziness. Get help right away if you have any of these symptoms.  Bathing.  Avoid soaking in a bath or hot tub until your incisions are healed.   Rest. You need a balance of rest and activity.   Exercise. Exercise per your caregiver's advice. You may need physical therapy or cardiac rehabilitation to help strengthen your muscles and build your endurance.   Climbing stairs. Unless your caregiver tells you not to climb stairs, go up stairs slowly and rest if you tire. Do not pull yourself up by the handrail.   Driving a car. Follow your caregiver's advice on when you may drive. You may ride as a passenger at any time. When traveling for long periods of time in a car, get out of the car and walk around for a few minutes every 2 hours.   Lifting. Avoid lifting, pushing, or pulling anything heavier than 10 pounds for 6 weeks after surgery or as told by your caregiver.   Returning to work. Check with your caregiver. People heal at different rates. Most people will be able to go back to work 6 to 12 weeks after surgery.   Sexual activity. You may resume sexual relations as told by your caregiver.  SEEK MEDICAL CARE IF:  Any of your incisions are red, painful, or have any type of drainage coming from them.   You have an  oral temperature above 102 F (38.9 C).   You have ankle or leg swelling.   You have pain in your legs.   You have weight gain of 2 or more pounds a day.   You feel dizzy or lightheaded when you stand up.  SEEK IMMEDIATE MEDICAL CARE IF:  You have angina or chest pain that goes to your jaw or arms. Call your local emergency services right away.   You have shortness of breath at rest or with activity.   You have a fast or irregular heartbeat (arrhythmia).   There is a "clicking" in your sternum when you move.   You have numbness or weakness in your arms or legs.  MAKE SURE YOU:  Understand these instructions.   Will watch your condition.   Will get help right away if you are not doing well or get worse.  Document Released: 04/06/2005 Document Revised: 09/06/2011 Document Reviewed: 11/22/2010 [facility] Patient Information 2012 [place].  Endoscopic Saphenous Vein Harvesting Care After Refer to this sheet in the next few weeks. These instructions provide you with information on caring for yourself after your procedure. Your caregiver may also give you more specific instructions. Your treatment has been planned according to current medical practices, but problems sometimes occur. Call your caregiver if you have any problems or questions after your procedure. HOME CARE INSTRUCTIONS Medicine  Take whatever pain medicine your surgeon prescribes. Follow the directions carefully. Do not take over-the-counter pain medicine unless your surgeon says it is okay. Some pain medicine can cause bleeding problems for several weeks after surgery.   Follow your surgeon's instructions about driving. You will probably not be permitted to drive after heart surgery.   Take any medicines your surgeon prescribes. Any medicines you took before your heart surgery should be checked with your caregiver before you start taking them again.  Wound care  Ask your surgeon how long you should keep  wearing your elastic bandage or stocking.   Check the area around your surgical cuts (incisions) whenever your bandages (dressings) are changed. Look for any redness or swelling.   You will need to return to have the stitches (sutures) or staples taken out. Ask your surgeon when to do that.   Ask your surgeon when you can shower or bathe.  Activity  Try to keep your legs raised when you are sitting.   Do any exercises your caregivers have given you. These may include deep breathing exercises, coughing, walking, or other exercises.  SEEK MEDICAL CARE IF:  You have any questions about your medicines.   You have more leg pain, especially if your pain medicine stops working.   New or growing bruises develop on your leg.   Your leg swells, feels tight, or becomes red.   You have numbness in your leg.  SEEK IMMEDIATE MEDICAL CARE IF:  Your pain gets much worse.   Blood or fluid leaks from any of the incisions.   Your incisions become warm, swollen, or red.   You have chest pain.   You have trouble breathing.   You have a fever.   You have more pain near your leg incision.  MAKE SURE YOU:  Understand these instructions.   Will watch your condition.   Will get help right away if you are not doing well or get worse.  Document Released: 05/30/2011 Document Revised: 09/06/2011 Document Reviewed: 05/30/2011 [facility] Patient Information 2012 [place]  .Endoscopic Saphenous Vein Harvesting Care After Refer to this sheet in the next few weeks. These instructions provide you with information on caring for yourself after your procedure. Your caregiver may also give you more specific instructions. Your treatment has been planned according to current medical practices, but problems sometimes occur. Call your caregiver if you have any problems or questions after your procedure. HOME CARE INSTRUCTIONS Medicine  Take whatever pain medicine your surgeon prescribes. Follow the  directions carefully. Do not take over-the-counter pain medicine unless your surgeon says it is okay. Some pain medicine can cause bleeding problems for several weeks after surgery.   Follow your surgeon's instructions about driving. You will probably not be permitted to drive after heart surgery.   Take any medicines your  surgeon prescribes. Any medicines you took before your heart surgery should be checked with your caregiver before you start taking them again.  Wound care  Ask your surgeon how long you should keep wearing your elastic bandage or stocking.   Check the area around your surgical cuts (incisions) whenever your bandages (dressings) are changed. Look for any redness or swelling.   You will need to return to have the stitches (sutures) or staples taken out. Ask your surgeon when to do that.   Ask your surgeon when you can shower or bathe.  Activity  Try to keep your legs raised when you are sitting.   Do any exercises your caregivers have given you. These may include deep breathing exercises, coughing, walking, or other exercises.  SEEK MEDICAL CARE IF:  You have any questions about your medicines.   You have more leg pain, especially if your pain medicine stops working.   New or growing bruises develop on your leg.   Your leg swells, feels tight, or becomes red.   You have numbness in your leg.  SEEK IMMEDIATE MEDICAL CARE IF:  Your pain gets much worse.   Blood or fluid leaks from any of the incisions.   Your incisions become warm, swollen, or red.   You have chest pain.   You have trouble breathing.   You have a fever.   You have more pain near your leg incision.  MAKE SURE YOU:  Understand these instructions.   Will watch your condition.   Will get help right away if you are not doing well or get worse.  Document Released: 05/30/2011 Document Revised: 09/06/2011 Document Reviewed: 05/30/2011 [facility] Patient Information 2012 [place].

## 2012-01-01 NOTE — Progress Notes (Signed)
CARDIAC REHAB PHASE I   PRE:  Rate/Rhythm: 73 SR    BP: sitting 150/70    SaO2: 98 1L  MODE:  Ambulation: 550 ft   POST:  Rate/Rhythm: 99 SR    BP: sitting 150/72     SaO2: 94 RA  Pt much improved today. More upright posture walking. Sts he has RW at home. D/C'd O2 and SaO2 94 RA entire walk. Left off in room. Would be good to check SaO2 asleep in bed. Will f/u. [id number]  Darrick Meigs CES, ACSM

## 2012-01-01 NOTE — Discharge Summary (Signed)
Physician Discharge Summary  Patient ID: Bradley Black MRN: [id number] DOB/AGE: [birth year] 66 y.o.  Admit date: 12/27/2011 Discharge date: 01/01/2012  Admission Diagnoses:  Patient Active Problem List  Diagnoses  . CAD  . DM   Hyperlipidemia  . HTN   Discharge Diagnoses:   Patient Active Problem List  Diagnoses  . CAD  . DM   Hyperlipidemia  . HTN  . S/P CABG x 4   Discharged Condition: good  History Of Present Illness:  Mr. Bradley Black is a 66 yo male with known history of CAD, HTN, Hyperlipidemia, and Type 2 Diabetes.  The patient suffered his first cardiac event in 56.  He underwent angioplasty of his OM in 1994 and LAD in 1997.  He underwent stent placement to RCA in 1999 and in 2001.  He has been routinely followed by Dr. Rex Black.  The patient presented to Dr. Rex Black with new complaints and symptoms of exertional angina. It was felt that due to the patient's history and current symptoms that he should undergo cardiac catheterization.  This was performed on 12/13/2011 by Dr. Ellyn Black and revealed severe three vessel disease involving the patient's LAD, OM2 and RCA and with mild LV systolic dysfunction with an EF of 45-50%.  Therefore, Cardiothoracic surgery was consulted for possible bypass procedure.  Dr. Roxy Black evaluated the patient on 12/17/2011 and felt the patient would be a candidate for bypass surgery.  He discussed the risks and benefits of the procedure in depth and the patient wished to proceed with surgery.  This was scheduled on an outpatient basis for 12/27/2011.  Hospital Course:   On 12/27/2011 the patient presented to [facility] and underwent CABG x4 utilizing LIMA to LAD, SVG to OM2, SVG to Diagnoal1, and SVG to PDA.   The patient also underwent Endoscopic Saphenous Vein Harvest of Right Leg.  The patient tolerated the procedure well and was taken to the surgical ICU in stable condition.  POD #0 the patient was extubated.  POD #1 the patient was placed on bipap due to  low oxygen saturations.  POD #2 the patient's bipap was changed to on an as needed basis.  He was started on nebulizer treatments for bilateral lower lobe atelectasis.  POD #3 the patient was placed on Avelox for pneumonia prophylaxis.  The patients chest tube and foley catheter was removed.  POD #4 the patient was medically stable.  He was transferred to the stepdown unit.  His post chest tube pull CXR did not reveal any evidence of pneumothorax.  POD #5 the patients creatinine has trended down to baseline.  He is having issues with Hypertension for which his home dose of Metoprolol was restarted.  He remains on oxygen which we will attempt to wean off.  However, if unable to do so the patient will be discharged home on oxygen.  His morning CXR reveals small bilateral pleural effusions.  The patient is medically stable at this time.  If he continues to do well we would anticipate discharge home in the next 24-48 hours.  Disposition: 01-Home or Self Care  Discharge Orders    Future Appointments: Provider: Department: Dept Phone: Center:   01/21/2012 4:00 PM Bradley Alberts, MD [facility] [phone] [facility]     Medication List  As of 01/01/2012  2:56 PM   STOP taking these medications         doxazosin 4 MG tablet      hydrALAZINE 50 MG tablet  isosorbide mononitrate 30 MG 24 hr tablet      metFORMIN 500 MG tablet         TAKE these medications         amLODipine-valsartan 10-320 MG per tablet   Commonly known as: EXFORGE   Take 1 tablet by mouth daily.      aspirin EC 81 MG tablet   Take 81 mg by mouth daily.      glyBURIDE-metformin 5-500 MG per tablet   Commonly known as: GLUCOVANCE   Take 1 tablet by mouth 2 (two) times daily with a meal.      guaiFENesin 600 MG 12 hr tablet   Commonly known as: MUCINEX   Take 2 tablets (1,200 mg total) by mouth 2 (two) times daily.      metoprolol succinate 25 MG 24 hr tablet   Commonly known as: TOPROL-XL   Take 25 mg by mouth daily.        moxifloxacin 400 MG tablet   Commonly known as: AVELOX   Take 1 tablet (400 mg total) by mouth daily at 6 PM.      omeprazole 40 MG capsule   Commonly known as: PRILOSEC   Take 40 mg by mouth daily.      oxyCODONE 5 MG immediate release tablet   Commonly known as: Oxy IR/ROXICODONE   Take 1-2 tablets (5-10 mg total) by mouth every 4 (four) hours as needed.      pravastatin 20 MG tablet   Commonly known as: PRAVACHOL   Take 20 mg by mouth daily.      traMADol 50 MG tablet   Commonly known as: ULTRAM   Take 1 tablet (50 mg total) by mouth every 6 (six) hours as needed.           Follow-up Information    Follow up with HARDING,DAVID W, MD. Call in 2 weeks.   Contact information:   [facility] [address], [place] [phone]       Follow up with Bradley Alberts, MD on 01/21/2012. (Appointment time is 400pm)    Contact information:   [place] [phone]       Follow up with [place] on 01/21/2012. (Appointment time is 9AM, of if patient wishes can have CXR  performed Friday before follow up appointment)    Contact information:   [place]          Signed: Ellwood Handler 01/01/2012, 2:56 PM

## 2012-01-01 NOTE — Progress Notes (Addendum)
Subjective:  Patient complains of dyspnea on ambulation  Objective:  Vital Signs in the last 24 hours: Temp:  [98 F (36.7 C)-98.3 F (36.8 C)] 98 F (36.7 C) (04/02 0528) Pulse Rate:  [75-88] 75  (04/02 0528) Resp:  [13-20] 19  (04/02 0528) BP: (136-178)/(74-101) 178/80 mmHg (04/02 0709) SpO2:  [95 %-98 %] 97 % (04/02 0528) Weight:  [159 lb 6.3 oz (72.3 kg)] 159 lb 6.3 oz (72.3 kg) (04/02 0659)  Intake/Output from previous day: 04/01 0701 - 04/02 0700 In: 1000 [P.O.:1000] Out: 300 [Urine:300] Intake/Output from this shift:    Physical Exam: General appearance: alert and no distress Lungs: clear to auscultation bilaterally Heart: regular rate and rhythm Abdomen: soft, non-tender; bowel sounds normal; no masses,  no organomegaly Extremities: edema trace Skin: incisions C/D/I  Lab Results:  Basename 01/01/12 0630 12/30/11 0411  WBC 7.3 9.5  HGB 11.2* 10.1*  PLT 167 122*    Basename 12/30/11 0411  NA 140  K 4.3  CL 107  CO2 24  GLUCOSE 91  BUN 45*  CREATININE 1.80*   No results found for this basename: TROPONINI:2,CK,MB:2 in the last 72 hours Hepatic Function Panel No results found for this basename: PROT,ALBUMIN,AST,ALT,ALKPHOS,BILITOT,BILIDIR,IBILI in the last 72 hours No results found for this basename: CHOL in the last 72 hours No results found for this basename: PROTIME in the last 72 hours  Imaging: Imaging results have been reviewed: Bilateral pleural effusions/atelectasis  Assessment/Plan:   1. S/P CABG 2. CV- NSR, rate in the 70s, HTN- patient not currently on any blood pressure medication.  Will start with low dose metoprolol and adjust as needed.  Will D/C pacing wires 3. Resp- remains on O2, will wean as tolerated, patient with  Small bilateral effusions/Atelectasis bilaterally will diurese, encourage use of IS 4. DM- CBGs well controlled on insulin 5. Acute Renal Insufficiency- last creatinine was 1.80, which was trending towards baseline 6.  Dispo- will need to get HTN controlled and wean patient off oxygen, possible d/c home in 24-48hrs   LOS: 5 days    BARRETT, ERIN 01/01/2012, 7:41 AM    Feels well this evening. Possibly home in AM

## 2012-01-02 LAB — GLUCOSE, CAPILLARY: Glucose-Capillary: 135 mg/dL — ABNORMAL HIGH (ref 70–99)

## 2012-01-02 MED ORDER — DOXAZOSIN MESYLATE 4 MG PO TABS
4.0000 mg | ORAL_TABLET | Freq: Every day | ORAL | Status: DC
  Filled 2012-01-02: qty 1

## 2012-01-02 NOTE — Progress Notes (Addendum)
Subjective:  Bradley Black has no complaints this morning.  He was weaned off his oxygen yesterday and states he feels ready to go home.  Objective:  Vital Signs in the last 24 hours: Temp:  [98 F (36.7 C)-99 F (37.2 C)] 98.5 F (36.9 C) (04/03 0521) Pulse Rate:  [69-78] 78  (04/03 0521) Resp:  [18-21] 20  (04/03 0521) BP: (131-170)/(71-100) 147/84 mmHg (04/03 0521) SpO2:  [92 %-97 %] 92 % (04/03 0521) Weight:  [156 lb 15.5 oz (71.2 kg)] 156 lb 15.5 oz (71.2 kg) (04/03 0521)  Intake/Output from previous day: 04/02 0701 - 04/03 0700 In: 963 [P.O.:960; I.V.:3] Out: 600 [Urine:600] Intake/Output from this shift:    Physical Exam: General appearance: alert, cooperative and no distress Lungs: clear to auscultation bilaterally Heart: regular rate and rhythm, S1, S2 normal, no murmur, click, rub or gallop Abdomen: soft, non-tender; bowel sounds normal; no masses,  no organomegaly Extremities: edema trace Skin: Skin color, texture, turgor normal. No rashes or lesions or incisions healing well, no erythema or drainage present Neurologic: Grossly normal  Lab Results:  Basename 01/01/12 0630  WBC 7.3  HGB 11.2*  PLT 167    Basename 01/01/12 0630  NA 142  K 3.8  CL 109  CO2 23  GLUCOSE 126*  BUN 34*  CREATININE 1.36*   No results found for this basename: TROPONINI:2,CK,MB:2 in the last 72 hours Hepatic Function Panel No results found for this basename: PROT,ALBUMIN,AST,ALT,ALKPHOS,BILITOT,BILIDIR,IBILI in the last 72 hours No results found for this basename: CHOL in the last 72 hours No results found for this basename: PROTIME in the last 72 hours  Assessment/Plan:   1. S/P CABG doing very well 2. CV- in NSR, EPW removed yesterday, HTN control better today, still running 140s after addition of Metoprolol.  3. Resp- clear bilateral lung sounds, weaned off oxygen yesterday, sats are stable in low to mids 90s. 4. ARI- creatinine back to baseline 5. Dispo- patient is  medically stable at this time.  He will be discharged home today  LOS: 6 days    BARRETT, ERIN 01/02/2012, 7:38 AM    I have seen and examined the patient and agree with the assessment and plan as outlined.  Restart Cardura and Exforge for BPH and hypertension  Lanier Felty H 01/02/2012 8:44 AM

## 2012-01-02 NOTE — Progress Notes (Signed)
[facility] [phone] Education completed with pt. Permission given to refer to [facility]. Pt states he has walker at home for use. Johnny Latu DunlapRN

## 2012-01-02 NOTE — Discharge Summary (Signed)
I agree with the above discharge summary and plan for follow-up.  Donne Robillard H  

## 2012-01-07 MED FILL — Heparin Sodium (Porcine) Inj 1000 Unit/ML: INTRAMUSCULAR | Qty: 10 | Status: AC

## 2012-01-07 MED FILL — Sodium Chloride Irrigation Soln 0.9%: Qty: 3000 | Status: AC

## 2012-01-07 MED FILL — Sodium Chloride IV Soln 0.9%: INTRAVENOUS | Qty: 1000 | Status: AC

## 2012-01-07 MED FILL — Electrolyte-R (PH 7.4) Solution: INTRAVENOUS | Qty: 3000 | Status: AC

## 2012-01-07 MED FILL — Sodium Bicarbonate IV Soln 8.4%: INTRAVENOUS | Qty: 50 | Status: AC

## 2012-01-07 MED FILL — Mannitol IV Soln 20%: INTRAVENOUS | Qty: 500 | Status: AC

## 2012-01-07 MED FILL — Lidocaine HCl IV Inj 20 MG/ML: INTRAVENOUS | Qty: 5 | Status: AC

## 2012-01-07 MED FILL — Heparin Sodium (Porcine) Inj 1000 Unit/ML: INTRAMUSCULAR | Qty: 30 | Status: AC

## 2012-01-08 ENCOUNTER — Other Ambulatory Visit: Payer: Self-pay

## 2012-01-08 ENCOUNTER — Telehealth: Payer: Self-pay

## 2012-01-08 NOTE — Progress Notes (Unsigned)
Open by mistake

## 2012-01-08 NOTE — Telephone Encounter (Signed)
Spoke with pt wife concern that husband not completely emptying bladder. History of enlarge prostate. Advise to contact. PCP

## 2012-01-16 ENCOUNTER — Other Ambulatory Visit: Payer: Self-pay | Admitting: Thoracic Surgery (Cardiothoracic Vascular Surgery)

## 2012-01-16 DIAGNOSIS — I2510 Atherosclerotic heart disease of native coronary artery without angina pectoris: Secondary | ICD-10-CM

## 2012-01-21 ENCOUNTER — Ambulatory Visit
Admission: RE | Admit: 2012-01-21 | Discharge: 2012-01-21 | Disposition: A | Discharge status: Home / Self Care | Payer: Medicare Other | Source: Ambulatory Visit | Attending: Thoracic Surgery (Cardiothoracic Vascular Surgery) | Admitting: Thoracic Surgery (Cardiothoracic Vascular Surgery)

## 2012-01-21 ENCOUNTER — Ambulatory Visit (INDEPENDENT_AMBULATORY_CARE_PROVIDER_SITE_OTHER): Payer: Self-pay | Admitting: Physician Assistant

## 2012-01-21 ENCOUNTER — Ambulatory Visit: Payer: Self-pay | Admitting: Thoracic Surgery (Cardiothoracic Vascular Surgery)

## 2012-01-21 ENCOUNTER — Ambulatory Visit: Payer: Self-pay

## 2012-01-21 VITALS — BP 114/68 | HR 66 | Resp 16 | Ht 68.0 in | Wt 157.5 lb

## 2012-01-21 DIAGNOSIS — Z09 Encounter for follow-up examination after completed treatment for conditions other than malignant neoplasm: Secondary | ICD-10-CM

## 2012-01-21 DIAGNOSIS — I2510 Atherosclerotic heart disease of native coronary artery without angina pectoris: Secondary | ICD-10-CM

## 2012-01-21 NOTE — Progress Notes (Signed)
                    [address]            [place] [address]          [phone]     HPI: Patient returns for routine postoperative follow-up having undergone CABG x 4 on 12/27/2011 by Dr. Roxy Manns. The patient's postoperative course was notable for possible early pneumonia, which was treated with Avelox. He also had some hypertension, and was restarted on home meds. He progressed well, and was discharged on 01/01/2102 in good condition.  Since hospital discharge the patient has continued to improve. His cough has completely resolved.  He denies chest pain or shortness of breath.  His appetite is slowly getting better.  He is walking daily.  He saw Dr. Rex Kras last week and will follow up again in 6 weeks.   Current Outpatient Prescriptions  Medication Sig Dispense Refill  . amLODipine-valsartan (EXFORGE) 10-320 MG per tablet Take 1 tablet by mouth daily.      [name] aspirin EC 81 MG tablet Take 81 mg by mouth daily.      . citalopram (CELEXA) 20 MG tablet Take 20 mg by mouth daily.      [name] doxazosin (CARDURA) 4 MG tablet Take 4 mg by mouth at bedtime.      [name] glyBURIDE-metformin (GLUCOVANCE) 5-500 MG per tablet Take 1 tablet by mouth 2 (two) times daily with a meal.      . guaiFENesin (MUCINEX) 600 MG 12 hr tablet Take 2 tablets (1,200 mg total) by mouth 2 (two) times daily.  60 tablet  0  . metoprolol succinate (TOPROL-XL) 25 MG 24 hr tablet Take 25 mg by mouth daily.       [name] omeprazole (PRILOSEC) 40 MG capsule Take 40 mg by mouth daily.      . pravastatin (PRAVACHOL) 20 MG tablet Take 20 mg by mouth daily.         Physical Exam: BP 114/68 HR 66 Resp 16 Wounds: Sternal and right leg wounds have healed well and sternum is stable to palpation. Heart: RRR Lungs: clear Extremities: no edema   Diagnostic Tests: Chest xray: Tiny bilateral pleural effusions.   Assessment/Plan: The patient is progressing well status post CABG.  He is cleared to proceed with outpatient cardiac  rehab.  His blood pressure looks good today.  He may begin driving and increase his activity as tolerated.  We will see him back as needed.

## 2013-01-23 ENCOUNTER — Telehealth: Payer: Self-pay | Admitting: Family Medicine

## 2013-01-23 MED ORDER — SITAGLIPTIN PHOSPHATE 50 MG PO TABS: 50.0000 mg | ORAL_TABLET | Freq: Every day | ORAL | Status: DC

## 2013-01-23 NOTE — Telephone Encounter (Signed)
Rx Refilled  

## 2013-02-09 ENCOUNTER — Other Ambulatory Visit (INDEPENDENT_AMBULATORY_CARE_PROVIDER_SITE_OTHER): Payer: Medicare Other

## 2013-02-09 DIAGNOSIS — E785 Hyperlipidemia, unspecified: Secondary | ICD-10-CM

## 2013-02-09 DIAGNOSIS — Z125 Encounter for screening for malignant neoplasm of prostate: Secondary | ICD-10-CM

## 2013-02-09 DIAGNOSIS — Z79899 Other long term (current) drug therapy: Secondary | ICD-10-CM

## 2013-02-09 DIAGNOSIS — I10 Essential (primary) hypertension: Secondary | ICD-10-CM

## 2013-02-09 DIAGNOSIS — Z0000 Encounter for general adult medical examination without abnormal findings: Secondary | ICD-10-CM

## 2013-02-09 DIAGNOSIS — E119 Type 2 diabetes mellitus without complications: Secondary | ICD-10-CM

## 2013-02-09 LAB — LIPID PANEL
Cholesterol: 114 mg/dL (ref 0–200)
Total CHOL/HDL Ratio: 2.9 Ratio
Triglycerides: 97 mg/dL (ref <150)
VLDL: 19 mg/dL (ref 0–40)

## 2013-02-09 LAB — CBC WITH DIFFERENTIAL/PLATELET
Basophils Relative: 0 % (ref 0–1)
Eosinophils Absolute: 0.3 10*3/uL (ref 0.0–0.7)
Eosinophils Relative: 5 % (ref 0–5)
Hemoglobin: 11.6 g/dL — ABNORMAL LOW (ref 13.0–17.0)
Lymphs Abs: 1.1 10*3/uL (ref 0.7–4.0)
MCH: 29.7 pg (ref 26.0–34.0)
MCHC: 33.6 g/dL (ref 30.0–36.0)
MCV: 88.2 fL (ref 78.0–100.0)
Monocytes Relative: 8 % (ref 3–12)
Neutrophils Relative %: 71 % (ref 43–77)
Platelets: 164 10*3/uL (ref 150–400)
RBC: 3.91 MIL/uL — ABNORMAL LOW (ref 4.22–5.81)

## 2013-02-09 LAB — COMPLETE METABOLIC PANEL WITH GFR
Albumin: 4.1 g/dL (ref 3.5–5.2)
BUN: 38 mg/dL — ABNORMAL HIGH (ref 6–23)
CO2: 22 mEq/L (ref 19–32)
Calcium: 10.5 mg/dL (ref 8.4–10.5)
Chloride: 111 mEq/L (ref 96–112)
Creat: 1.99 mg/dL — ABNORMAL HIGH (ref 0.50–1.35)
GFR, Est African American: 38 mL/min — ABNORMAL LOW
GFR, Est Non African American: 33 mL/min — ABNORMAL LOW
Glucose, Bld: 136 mg/dL — ABNORMAL HIGH (ref 70–99)
Potassium: 5.6 mEq/L — ABNORMAL HIGH (ref 3.5–5.3)

## 2013-02-09 LAB — HEMOGLOBIN A1C: Hgb A1c MFr Bld: 6.5 % — ABNORMAL HIGH (ref <5.7)

## 2013-02-09 LAB — PSA, MEDICARE: PSA: 1.08 ng/mL (ref <=4.00)

## 2013-02-12 ENCOUNTER — Ambulatory Visit (INDEPENDENT_AMBULATORY_CARE_PROVIDER_SITE_OTHER): Payer: Medicare Other | Admitting: Family Medicine

## 2013-02-12 ENCOUNTER — Encounter: Payer: Self-pay | Admitting: Family Medicine

## 2013-02-12 VITALS — BP 110/60 | HR 56 | Temp 98.0°F | Resp 14 | Ht 68.0 in | Wt 161.0 lb

## 2013-02-12 DIAGNOSIS — N289 Disorder of kidney and ureter, unspecified: Secondary | ICD-10-CM

## 2013-02-12 DIAGNOSIS — R001 Bradycardia, unspecified: Secondary | ICD-10-CM

## 2013-02-12 DIAGNOSIS — Z23 Encounter for immunization: Secondary | ICD-10-CM

## 2013-02-12 DIAGNOSIS — E875 Hyperkalemia: Secondary | ICD-10-CM

## 2013-02-12 DIAGNOSIS — E119 Type 2 diabetes mellitus without complications: Secondary | ICD-10-CM

## 2013-02-12 DIAGNOSIS — Z0000 Encounter for general adult medical examination without abnormal findings: Secondary | ICD-10-CM

## 2013-02-12 DIAGNOSIS — E785 Hyperlipidemia, unspecified: Secondary | ICD-10-CM

## 2013-02-12 DIAGNOSIS — I498 Other specified cardiac arrhythmias: Secondary | ICD-10-CM

## 2013-02-12 MED ORDER — SILDENAFIL CITRATE 100 MG PO TABS: 100.0000 mg | ORAL_TABLET | Freq: Every day | ORAL | Status: DC | PRN

## 2013-02-12 NOTE — Progress Notes (Signed)
Subjective:    Patient ID: Bradley Black, male    DOB: 30-Oct-1945, 67 y.o.   MRN: [id number]  HPI Patient presents today for complete physical exam. However he has several concerns. 1 he like to discuss his hemoglobin A1c and fasting lipid panel. These were reviewed with the patient. His A1c is excellent at 6.5. His fasting lipid panel reveals an LDL cholesterol well below 70. His triglycerides and HDL are within normal range. #2 his creatinine has become elevated at 1.99. #3 his potassium was elevated at 5.6. #4 his hemoglobin was slightly low at 11.6. #5 he reports profound fatigue and lethargy. His heart rate has been ranging 40s to 50s. He is currently taking Toprol-XL 25 mg by mouth daily. He also complains of his ears being stopped up and occluded. Past Medical History  Diagnosis Date  . Diabetes mellitus   . Hypertension   . Coronary artery disease   . Acute myocardial infarction 1988  . Hyperlipidemia   . Benign prostatic hypertrophy   . Back pain   . Cyst     right shoulder  . 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    LIMA to LAD, SVG to D1, SVG to OM2, SVG to PDA, EVH via right thigh and leg   Current Outpatient Prescriptions on File Prior to Visit  Medication Sig Dispense Refill  . amLODipine-valsartan (EXFORGE) 10-320 MG per tablet Take 1 tablet by mouth daily.      [name] aspirin EC 81 MG tablet Take 81 mg by mouth daily.      . citalopram (CELEXA) 20 MG tablet Take 20 mg by mouth daily.      [name] doxazosin (CARDURA) 4 MG tablet Take 4 mg by mouth at bedtime.      . metoprolol succinate (TOPROL-XL) 25 MG 24 hr tablet Take 25 mg by mouth daily.       [name] omeprazole (PRILOSEC) 40 MG capsule Take 40 mg by mouth daily.      . pravastatin (PRAVACHOL) 20 MG tablet Take 20 mg by mouth daily.       . sitaGLIPtin (JANUVIA) 50 MG tablet Take 1 tablet (50 mg total) by mouth daily.  30 tablet  3   No current facility-administered medications on file prior to visit.   No Known Allergies History   Social  History  . Marital Status: Married    Spouse Name: N/A    Number of Children: N/A  . Years of Education: N/A   Occupational History  . Not on file.   Social History Main Topics  . Smoking status: Former Smoker    Types: Cigarettes    Quit date: 10/01/1986  . Smokeless tobacco: Never Used     Comment: quit about 30 yrs ago  . Alcohol Use: No  . Drug Use: No  . 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 Not on file   Social History Narrative  . No narrative on file   Family History  Problem Relation Age of Onset  . Cancer Mother     breast  . Cancer Father     bone      Review of Systems  Constitutional: Positive for activity change and fatigue. Negative for fever, chills, diaphoresis, appetite change and unexpected weight change.  HENT: Positive for hearing loss. Negative for ear pain, congestion, rhinorrhea and ear discharge.   Eyes: Negative.  Respiratory: Negative.   Cardiovascular: Negative.   Gastrointestinal: Negative.   Genitourinary: Negative.   Allergic/Immunologic: Negative.   Neurological: Negative.   Hematological: Negative.   Psychiatric/Behavioral: Negative.        Objective:   Physical Exam  Vitals reviewed. Constitutional: He is oriented to person, place, and time. He appears well-developed and well-nourished. No distress.  HENT:  Head: Normocephalic and atraumatic.  Right Ear: External ear normal.  Left Ear: External ear normal.  Nose: Nose normal.  Mouth/Throat: Oropharynx is clear and moist.  Eyes: Conjunctivae and EOM are normal. Pupils are equal, round, and reactive to light. Right eye exhibits no discharge. Left eye exhibits no discharge. No scleral icterus.  Neck: Normal range of motion. Neck supple. No JVD present. No tracheal deviation present. No thyromegaly present.  Cardiovascular: Normal heart sounds and intact distal pulses.  An  irregular rhythm present. Bradycardia present.   No murmur heard. Pulmonary/Chest: Effort normal and breath sounds normal. No respiratory distress. He has no wheezes. He has no rales. He exhibits no tenderness.  Abdominal: Soft. Bowel sounds are normal. He exhibits no distension and no mass. There is no tenderness. There is no rebound and no guarding.  Genitourinary: Rectum normal, prostate normal and penis normal. No penile tenderness.  Musculoskeletal: Normal range of motion. He exhibits no edema and no tenderness.  Lymphadenopathy:    He has no cervical adenopathy.  Neurological: He is alert and oriented to person, place, and time. He has normal reflexes. He displays normal reflexes. No cranial nerve deficit. He exhibits normal muscle tone. Coordination normal.  Skin: Skin is warm and dry. No rash noted. He is not diaphoretic. No erythema. No pallor.  Psychiatric: He has a normal mood and affect. His behavior is normal. Judgment and thought content normal.   Diabetic foot exam was performed       Assessment & Plan:  1. Need for prophylactic vaccination and inoculation against unspecified single disease - Pneumococcal polysaccharide vaccine 23-valent greater than or equal to 2yo subcutaneous/IM Patient is due for his Pneumovax. His first dose was over 10 years ago. He is currently 67 years old. Therefore he qualifies.  He is also due for a stent the shot, but he defers this today 2. Routine general medical examination at a health care facility His colonoscopy is overdue. His last colonoscopy was 2003. However he defers this for now. He states he does not want to do that procedure. Him that if his hemoglobin does not improve on iron, he would likely need a GI consult for EGD and colonoscopy the  3. Hyperkalemia I have asked him to reduce Exforge 10/360 to half a tablet a day we will recheck his potassium in one month.  4. Renal insufficiency I have asked him to reduce his Exforge 10/360  to one half a tablet a day and we'll recheck his creatinine in one month  5. Bradycardia EKG reveals sinus bradycardia with frequent PVCs. There is no evidence of ischemia. There is an are of infarction with Q waves in leads 23 and aVF consistent with an inferior infarct.  I have asked the patient to discontinue his metoprolol and let us recheck his heart rate in one month. I think this is his likely source of fatigue.  6. Type II or unspecified type diabetes mellitus without mention of complication, not stated as uncontrolled Diabetes is currently well controlled, continue same medications.  7. HLD (hyperlipidemia) LDL is below his goal of 70. Continue his current  medication.   I will recheck the patient in one month. I have asked him to start taking iron every day. Recheck CBC in one month. If his hemoglobin continues to drop, we'll recommend GI consult for colonoscopy/egd.

## 2013-02-17 ENCOUNTER — Encounter: Payer: Self-pay | Admitting: Family Medicine

## 2013-02-23 ENCOUNTER — Telehealth: Payer: Self-pay | Admitting: Family Medicine

## 2013-02-24 MED ORDER — PRAVASTATIN SODIUM 20 MG PO TABS: 20.0000 mg | ORAL_TABLET | Freq: Every day | ORAL | Status: DC

## 2013-02-24 NOTE — Telephone Encounter (Signed)
Rx Refilled  

## 2013-03-10 ENCOUNTER — Other Ambulatory Visit: Payer: Self-pay | Admitting: Family Medicine

## 2013-03-23 ENCOUNTER — Ambulatory Visit (INDEPENDENT_AMBULATORY_CARE_PROVIDER_SITE_OTHER): Payer: Medicare Other | Admitting: Family Medicine

## 2013-03-23 ENCOUNTER — Encounter: Payer: Self-pay | Admitting: Family Medicine

## 2013-03-23 ENCOUNTER — Other Ambulatory Visit: Payer: Self-pay | Admitting: Family Medicine

## 2013-03-23 VITALS — BP 130/60 | HR 62 | Temp 97.8°F | Resp 16 | Wt 156.0 lb

## 2013-03-23 DIAGNOSIS — N184 Chronic kidney disease, stage 4 (severe): Secondary | ICD-10-CM

## 2013-03-23 DIAGNOSIS — R002 Palpitations: Secondary | ICD-10-CM

## 2013-03-23 DIAGNOSIS — I10 Essential (primary) hypertension: Secondary | ICD-10-CM

## 2013-03-23 DIAGNOSIS — D649 Anemia, unspecified: Secondary | ICD-10-CM

## 2013-03-23 LAB — CBC WITH DIFFERENTIAL/PLATELET
Eosinophils Absolute: 0.3 10*3/uL (ref 0.0–0.7)
Eosinophils Relative: 5 % (ref 0–5)
HCT: 37.1 % — ABNORMAL LOW (ref 39.0–52.0)
Lymphocytes Relative: 17 % (ref 12–46)
Lymphs Abs: 1.1 10*3/uL (ref 0.7–4.0)
MCH: 30.4 pg (ref 26.0–34.0)
MCV: 91.8 fL (ref 78.0–100.0)
Monocytes Absolute: 0.6 10*3/uL (ref 0.1–1.0)
Platelets: 158 10*3/uL (ref 150–400)
RBC: 4.04 MIL/uL — ABNORMAL LOW (ref 4.22–5.81)
WBC: 6.7 10*3/uL (ref 4.0–10.5)

## 2013-03-23 LAB — BASIC METABOLIC PANEL
BUN: 45 mg/dL — ABNORMAL HIGH (ref 6–23)
CO2: 21 mEq/L (ref 19–32)
Calcium: 10.2 mg/dL (ref 8.4–10.5)
Chloride: 112 mEq/L (ref 96–112)
Creat: 1.89 mg/dL — ABNORMAL HIGH (ref 0.50–1.35)
Glucose, Bld: 116 mg/dL — ABNORMAL HIGH (ref 70–99)

## 2013-03-23 NOTE — Progress Notes (Signed)
Subjective:    Patient ID: Bradley Black, male    DOB: 1946/04/02, 67 y.o.   MRN: [id number]  HPI 02/12/13 Patient presents today for complete physical exam. However he has several concerns. 1 he like to discuss his hemoglobin A1c and fasting lipid panel. These were reviewed with the patient. His A1c is excellent at 6.5. His fasting lipid panel reveals an LDL cholesterol well below 70. His triglycerides and HDL are within normal range. #2 his creatinine has become elevated at 1.99. #3 his potassium was elevated at 5.6. #4 his hemoglobin was slightly low at 11.6. #5 he reports profound fatigue and lethargy. His heart rate has been ranging 40s to 50s. He is currently taking Toprol-XL 25 mg by mouth daily. He also complains of his ears being stopped up and occluded.  At that time, my plan was: 1. Need for prophylactic vaccination and inoculation against unspecified single disease - Pneumococcal polysaccharide vaccine 23-valent greater than or equal to 2yo subcutaneous/IM Patient is due for his Pneumovax. His first dose was over 10 years ago. He is currently 67 years old. Therefore he qualifies.  He is also due for a stent the shot, but he defers this today 2. Routine general medical examination at a health care facility His colonoscopy is overdue. His last colonoscopy was 2003. However he defers this for now. He states he does not want to do that procedure. Him that if his hemoglobin does not improve on iron, he would likely need a GI consult for EGD and colonoscopy the  3. Hyperkalemia I have asked him to reduce Exforge 10/360 to half a tablet a day we will recheck his potassium in one month.  4. Renal insufficiency I have asked him to reduce his Exforge 10/360 to one half a tablet a day and we'll recheck his creatinine in one month  5. Bradycardia EKG reveals sinus bradycardia with frequent PVCs. There is no evidence of ischemia. There is an are of infarction with Q waves in leads 23 and aVF  consistent with an inferior infarct.  I have asked the patient to discontinue his metoprolol and let us recheck his heart rate in one month. I think this is his likely source of fatigue.  6. Type II or unspecified type diabetes mellitus without mention of complication, not stated as uncontrolled Diabetes is currently well controlled, continue same medications.  7. HLD (hyperlipidemia) LDL is below his goal of 70. Continue his current medication.   I will recheck the patient in one month. I have asked him to start taking iron every day. Recheck CBC in one month. If his hemoglobin continues to drop, we'll recommend GI consult for colonoscopy/egd.  03/23/13 Patient returns today. He is complaining of less fatigue. His heart rate has improved. However on exam it is still irregularly irregular with frequent PVCs. He denies chest pain or shortness of breath. His blood pressure is stable even on less medication. He is here today to recheck a CMP as well his hemoglobin. He has been taking the iron as prescribed. Past Medical History  Diagnosis Date  . Diabetes mellitus   . Hypertension   . Coronary artery disease   . Acute myocardial infarction 1988  . Hyperlipidemia   . Benign prostatic hypertrophy   . Back pain   . Cyst     right shoulder  . 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 amLODipine-valsartan (EXFORGE) 10-320 MG per tablet Take 0.5 tablets by mouth daily.       [name] aspirin EC 81 MG tablet Take 81 mg by mouth daily.      . citalopram (CELEXA) 20 MG tablet Take 20 mg by mouth daily.      [name] doxazosin (CARDURA) 4 MG tablet TAKE ONE TABLET BY MOUTH EVERY DAY  30 tablet  2  . Naproxen Sodium 220 MG CAPS Take 1 capsule by mouth daily as needed (Back Pain).      [name] omeprazole (PRILOSEC) 40 MG capsule Take 40 mg by mouth daily.      .  pravastatin (PRAVACHOL) 20 MG tablet Take 1 tablet (20 mg total) by mouth daily.  30 tablet  5  . sildenafil (VIAGRA) 100 MG tablet Take 1 tablet (100 mg total) by mouth daily as needed for erectile dysfunction.  60 tablet  3  . sitaGLIPtin (JANUVIA) 50 MG tablet Take 1 tablet (50 mg total) by mouth daily.  30 tablet  3   No current facility-administered medications on file prior to visit.   No Known Allergies History   Social History  . Marital Status: Married    Spouse Name: N/A    Number of Children: N/A  . Years of Education: N/A   Occupational History  . Not on file.   Social History Main Topics  . Smoking status: Former Smoker    Types: Cigarettes    Quit date: 10/01/1986  . Smokeless tobacco: Never Used     Comment: quit about 30 yrs ago  . Alcohol Use: No  . Drug Use: No  . 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 Not on file   Social History Narrative  . No narrative on file   Family History  Problem Relation Age of Onset  . Cancer Mother     breast  . Cancer Father     bone      Review of Systems  Constitutional: Positive for activity change and fatigue. Negative for fever, chills, diaphoresis, appetite change and unexpected weight change.  HENT: Positive for hearing loss. Negative for ear pain, congestion, rhinorrhea and ear discharge.   Eyes: Negative.   Respiratory: Negative.   Cardiovascular: Negative.   Gastrointestinal: Negative.   Genitourinary: Negative.   Allergic/Immunologic: Negative.   Neurological: Negative.   Hematological: Negative.   Psychiatric/Behavioral: Negative.        Objective:   Physical Exam  Vitals reviewed. Constitutional: He is oriented to person, place, and time. He appears well-developed and well-nourished. No distress.  HENT:  Head: Normocephalic and atraumatic.  Right Ear: External ear normal.  Left Ear: External ear  normal.  Nose: Nose normal.  Mouth/Throat: Oropharynx is clear and moist.  Eyes: Conjunctivae and EOM are normal. Pupils are equal, round, and reactive to light. Right eye exhibits no discharge. Left eye exhibits no discharge. No scleral icterus.  Neck: Normal range of motion. Neck supple. No JVD present. No tracheal deviation present. No thyromegaly present.  Cardiovascular: Normal heart sounds and intact distal pulses.  An irregular rhythm present. Bradycardia present.   No murmur heard. Pulmonary/Chest: Effort normal and breath sounds normal. No respiratory distress. He has no wheezes. He has no rales. He exhibits no tenderness.  Abdominal: Soft. Bowel sounds are normal. He exhibits no distension and no mass. There is no tenderness. There is no rebound and no guarding.  Genitourinary: Rectum normal, prostate normal and penis normal. No penile tenderness.  Musculoskeletal: Normal range of motion. He exhibits no edema and no tenderness.  Lymphadenopathy:    He has no cervical adenopathy.  Neurological: He is alert and oriented to person, place, and time. He has normal reflexes. No cranial nerve deficit. He exhibits normal muscle tone. Coordination normal.  Skin: Skin is warm and dry. No rash noted. He is not diaphoretic. No erythema. No pallor.  Psychiatric: He has a normal mood and affect. His behavior is normal. Judgment and thought content normal.   Diabetic foot exam was performed       Assessment & Plan:  1. Anemia Recheck CBC today. If hemoglobin is not trending up, I recommend GI consult for EGD. - CBC with Differential  2. Chronic kidney disease (CKD), stage IV (severe) This may be another source of his anemia. I will recheck a BMP today now that we have decreased the dose of the valsartan. - Basic Metabolic Panel  3. HTN (hypertension) Even on less Exforge and Toprol having been discontinued, patient's blood pressure is acceptable. 4. Palpitations Given the patient's  fatigue, I will schedule an event monitor to rule out severe bradycardia, or other arrhythmias which may require pacemaker versus anti-coagulation. - Holter monitor - 24 hour; Future

## 2013-03-27 ENCOUNTER — Other Ambulatory Visit: Payer: Self-pay | Admitting: Family Medicine

## 2013-03-27 DIAGNOSIS — D509 Iron deficiency anemia, unspecified: Secondary | ICD-10-CM

## 2013-04-14 ENCOUNTER — Ambulatory Visit (INDEPENDENT_AMBULATORY_CARE_PROVIDER_SITE_OTHER): Payer: Medicare Other | Admitting: Family Medicine

## 2013-04-14 DIAGNOSIS — I498 Other specified cardiac arrhythmias: Secondary | ICD-10-CM

## 2013-04-14 DIAGNOSIS — R001 Bradycardia, unspecified: Secondary | ICD-10-CM

## 2013-04-14 NOTE — Progress Notes (Signed)
Patient ID: Bradley Black, male   DOB: Mar 12, 1946, 67 y.o.   MRN: [id number] Pt with Bradycardia.  24 hour Holter Monitor ordered by Dr Dennard Schaumann.  Pt here today to have monitor applied.  Monitor applied w/o problem.  Pt aware of care of equipment.  Diary to log any symptoms while monitor is on.  Do not get monitor wet.  Return in AM or removal.

## 2013-04-15 ENCOUNTER — Ambulatory Visit (INDEPENDENT_AMBULATORY_CARE_PROVIDER_SITE_OTHER): Payer: Medicare Other | Admitting: Family Medicine

## 2013-04-15 DIAGNOSIS — R001 Bradycardia, unspecified: Secondary | ICD-10-CM

## 2013-04-15 DIAGNOSIS — I498 Other specified cardiac arrhythmias: Secondary | ICD-10-CM

## 2013-04-16 ENCOUNTER — Telehealth: Payer: Self-pay | Admitting: Family Medicine

## 2013-04-16 MED ORDER — OMEPRAZOLE 40 MG PO CPDR: 40.0000 mg | DELAYED_RELEASE_CAPSULE | Freq: Every day | ORAL | Status: DC

## 2013-04-16 NOTE — Progress Notes (Signed)
  Subjective:    Patient ID: Bradley Black, male    DOB: Mar 09, 1946, 67 y.o.   MRN: [id number]  HPI Nurse visit to apply holter monitor.   Review of Systems     Objective:   Physical Exam        Assessment & Plan:

## 2013-04-16 NOTE — Telephone Encounter (Signed)
Rx Refilled  

## 2013-04-23 ENCOUNTER — Ambulatory Visit (INDEPENDENT_AMBULATORY_CARE_PROVIDER_SITE_OTHER): Payer: Medicare Other | Admitting: Gastroenterology

## 2013-04-23 ENCOUNTER — Encounter: Payer: Self-pay | Admitting: Gastroenterology

## 2013-04-23 VITALS — BP 135/74 | HR 60 | Temp 97.4°F | Ht 68.0 in | Wt 159.4 lb

## 2013-04-23 DIAGNOSIS — D649 Anemia, unspecified: Secondary | ICD-10-CM

## 2013-04-23 NOTE — Progress Notes (Signed)
Primary Care Physician:  Odette Fraction, MD Primary Gastroenterologist:  Dr. Gala Romney   Chief Complaint  Patient presents with  . Anemia  . Colonoscopy    HPI:   Presents today at the request of Dr. Dennard Schaumann secondary to anemia. States last colonoscopy in [place] at least 10 years ago. He does have a history of polyps in the very remote past. States he was scheduled to have a colonoscopy a few years ago and was unable to drink the prep, started vomiting. Refusing colonoscopy at this time.   No rectal bleeding, no change in bowel habits. No abdominal pain. Takes Prilosec 40 mg daily for GERD, which controls symptoms. No dysphagia. No appetite changes or unexplained weight loss. Usually takes Naprosyn about every other day, once a day.   Past Medical History  Diagnosis Date  . Diabetes mellitus   . Hypertension   . Coronary artery disease   . Acute myocardial infarction 1988  . Hyperlipidemia   . Benign prostatic hypertrophy   . Back pain   . Cyst     right shoulder  . 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 GERD (gastroesophageal reflux disease)   . Chronic kidney disease     Past Surgical History  Procedure Laterality Date  . Back surgery  1970  . Mass excision  10/24/11    R arm  . Lumbar fusion    . Coronary angioplasty  1994    OM  . Coronary angioplasty  1997    LAD  . Coronary angioplasty with stent placement  1999    RCA  . Coronary angioplasty with stent placement  2001    RCA  . Incision and drainage of wound  2006    axilla  . Hernia repair    . Coronary artery bypass graft  12/27/2011    Procedure: CORONARY ARTERY BYPASS GRAFTING (CABG);  Surgeon: Rexene Alberts, MD;  Location: [facility];  Service: Open Heart Surgery;  Laterality: N/A;  Times four. On pump. Using endoscopically harvested right greater saphenous vein and left internal mammary artery.     Current Outpatient Prescriptions   Medication Sig Dispense Refill  . amLODipine-valsartan (EXFORGE) 10-320 MG per tablet Take 0.5 tablets by mouth daily.       [name] aspirin EC 81 MG tablet Take 81 mg by mouth daily.      . citalopram (CELEXA) 20 MG tablet Take 20 mg by mouth daily.      [name] doxazosin (CARDURA) 4 MG tablet TAKE ONE TABLET BY MOUTH EVERY DAY  30 tablet  2  . ferrous sulfate 325 (65 FE) MG tablet Take 65 mg by mouth daily with breakfast.      . naproxen (NAPROSYN) 500 MG tablet Take 500 mg by mouth once.      [name] omeprazole (PRILOSEC) 40 MG capsule Take 1 capsule (40 mg total) by mouth daily.  30 capsule  11  . pravastatin (PRAVACHOL) 20 MG tablet Take 1 tablet (20 mg total) by mouth daily.  30 tablet  5  . sitaGLIPtin (JANUVIA) 50 MG tablet Take 1 tablet (50 mg total) by mouth daily.  30 tablet  3   No current facility-administered medications for this visit.    Allergies as of 04/23/2013  . (No Known Allergies)    Family History  Problem Relation Age of Onset  . Cancer Mother  breast  . Cancer Father     bone  . [place] cancer Neg Hx     History   Social History  . Marital Status: Married    Spouse Name: N/A    Number of Children: N/A  . Years of Education: N/A   Occupational History  . Not on file.   Social History Main Topics  . Smoking status: Former Smoker    Types: Cigarettes    Quit date: 10/01/1986  . Smokeless tobacco: Never Used     Comment: quit about 30 yrs ago  . Alcohol Use: No  . Drug Use: No  . 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 Not on file   Social History Narrative  . No narrative on file    Review of Systems: Negative unless mentioned in HPI.  Physical Exam: BP 135/74  Pulse 60  Temp(Src) 97.4 F (36.3 C) (Oral)  Ht 5\' 8"  (1.727 m)  Wt 159 lb 6.4 oz (72.303 kg)  BMI 24.24 kg/m2 General:   Alert and oriented. Pleasant and cooperative. Well-nourished and  well-developed.  Head:  Normocephalic and atraumatic. Eyes:  Without icterus, sclera clear and conjunctiva pink.  Ears:  Normal auditory acuity. Nose:  No deformity, discharge,  or lesions. Mouth:  No deformity or lesions, oral mucosa pink.  Neck:  Supple, without mass or thyromegaly. Lungs:  Clear to auscultation bilaterally. No wheezes, rales, or rhonchi. No distress.  Heart:  S1, S2 present without murmurs appreciated.  Abdomen:  +BS, soft, non-tender and non-distended. No HSM noted. No guarding or rebound. No masses appreciated.  Rectal:  Deferred  Msk:  Symmetrical without gross deformities. Normal posture. Extremities:  Without clubbing or edema. Neurologic:  Alert and  oriented x4;  grossly normal neurologically. Skin:  Intact without significant lesions or rashes. Cervical Nodes:  No significant cervical adenopathy. Psych:  Alert and cooperative. Normal mood and affect.     Lab Results  Component Value Date   WBC 6.7 03/23/2013   HGB 12.3* 03/23/2013   HCT 37.1* 03/23/2013   MCV 91.8 03/23/2013   PLT 158 03/23/2013

## 2013-04-23 NOTE — Assessment & Plan Note (Signed)
67 year old male with a history of anemia dating back at least a year, with Hgb ranging from 10-12 range. Most recent Hgb 12.3 after taking iron (up from 11.6). His last colonoscopy was at least 10 years ago in [place], and he does not recall who performed this. Denies any lower or upper GI symptoms currently. No overt signs of GI bleeding. Iron, ferritin, and hemoccult status unknown. Notably, he takes Naprosyn every other day for pain. With his history of chronic kidney disease, may have a multifactorial component to anemia. I discussed importance of repeat lower GI evaluation with possible upper endoscopy in the near future. He is opposed to a colonoscopy at this time due to a previous inability to tolerate the prep. I discussed pre-medication with anti-emetics for the prep, but he still declines.   At this point, could pursue EGD only and at least attempt to pursue a virtual colonoscopy as an outpatient. He is agreeable to this plan. I would like to obtain iron studies first and an ifobt. With his routine use of Naprosyn, could have occult GI injury anywhere in the GI tract.   Obtain iron, ferritin, ifobt. Proceed with upper endoscopy in the near future with Dr. Gala Romney. The risks, benefits, and alternatives have been discussed in detail with patient. They have stated understanding and desire to proceed.  Virtual colonoscopy if insurance covers. Patient was educated regarding the possibility of small polyps/masses overlooked via this method. He understands.

## 2013-04-23 NOTE — Progress Notes (Signed)
CC PCP 

## 2013-04-23 NOTE — Patient Instructions (Addendum)
Please complete the stool sample and return to our office as soon as possible. I have also ordered blood work to check your iron levels.  We will be in touch shortly after results are reviewed to schedule the appropriate procedures!

## 2013-04-24 LAB — FERRITIN: Ferritin: 19 ng/mL — ABNORMAL LOW (ref 22–322)

## 2013-04-24 IMAGING — CR DG CHEST 2V
2 series · 2 of 2 positions shown · non-contrast
Comparison: 12/11/2011

CLINICAL DATA: Preop for CABG

CHEST - 2 VIEW

[view not recorded (1 of 2)]
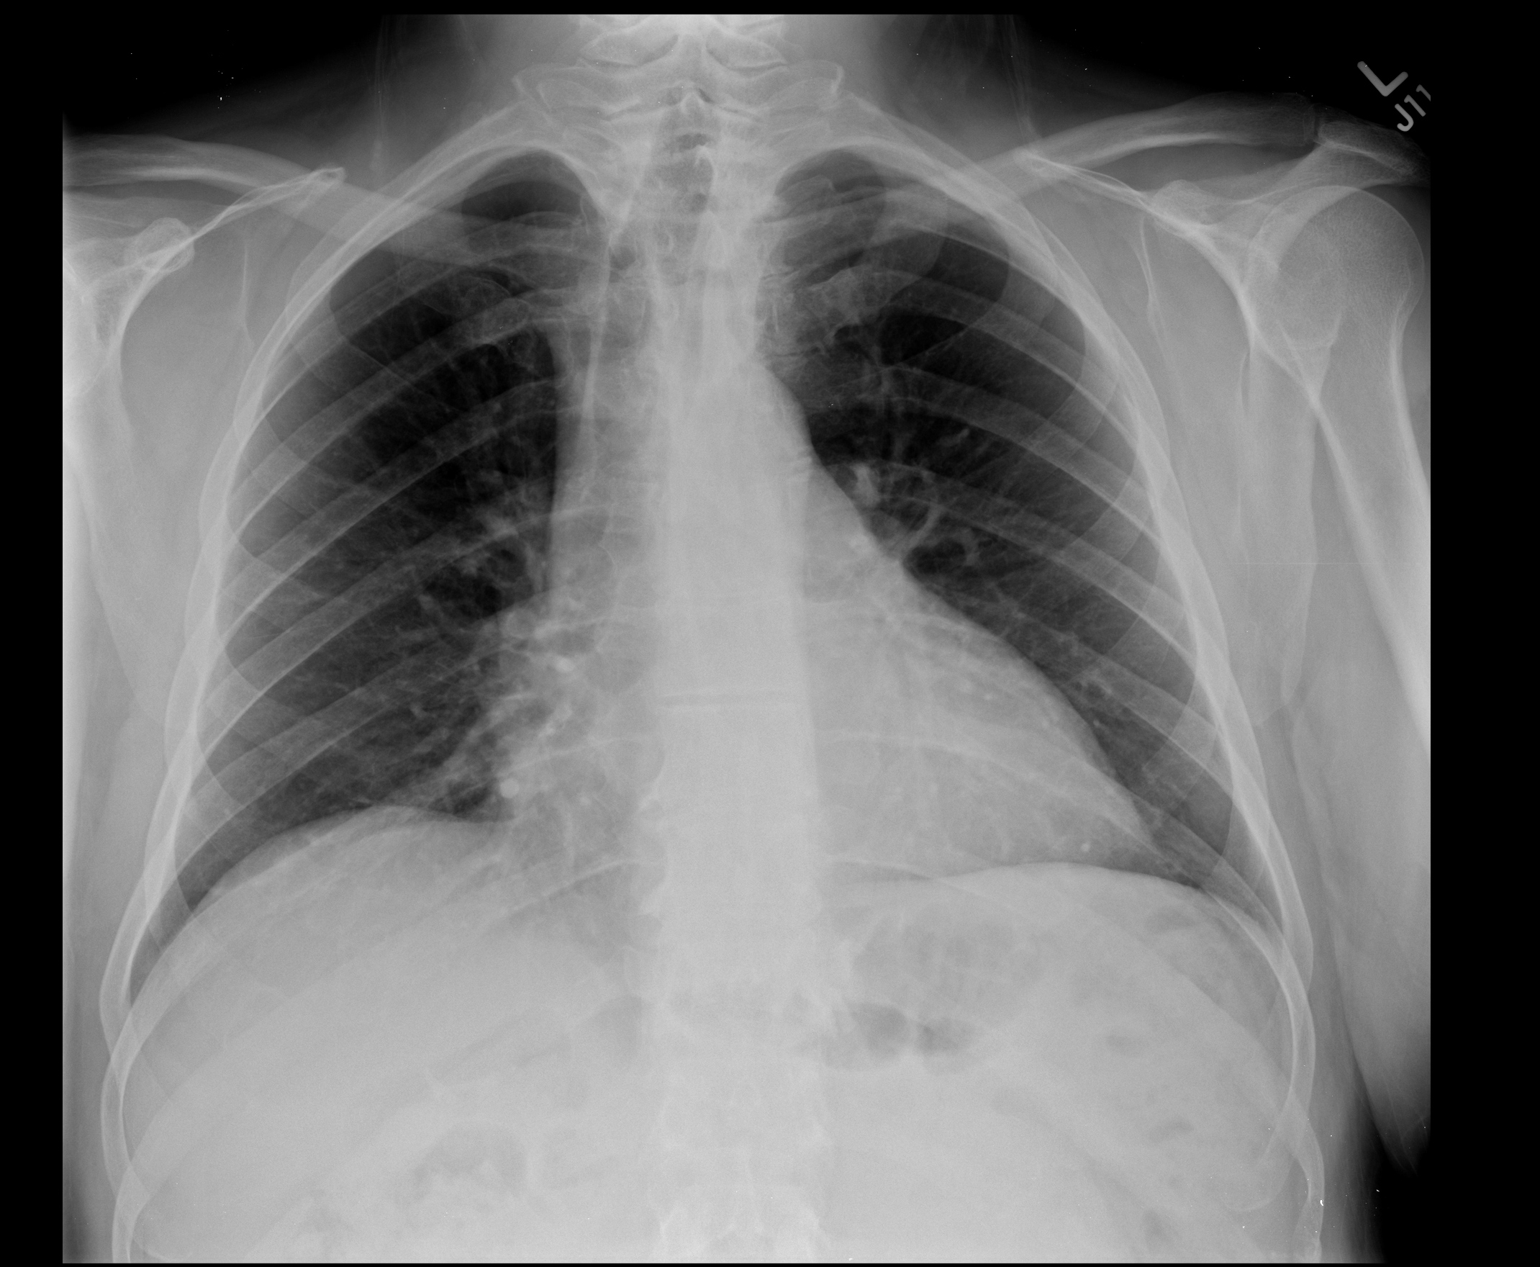

[view not recorded (2 of 2)]
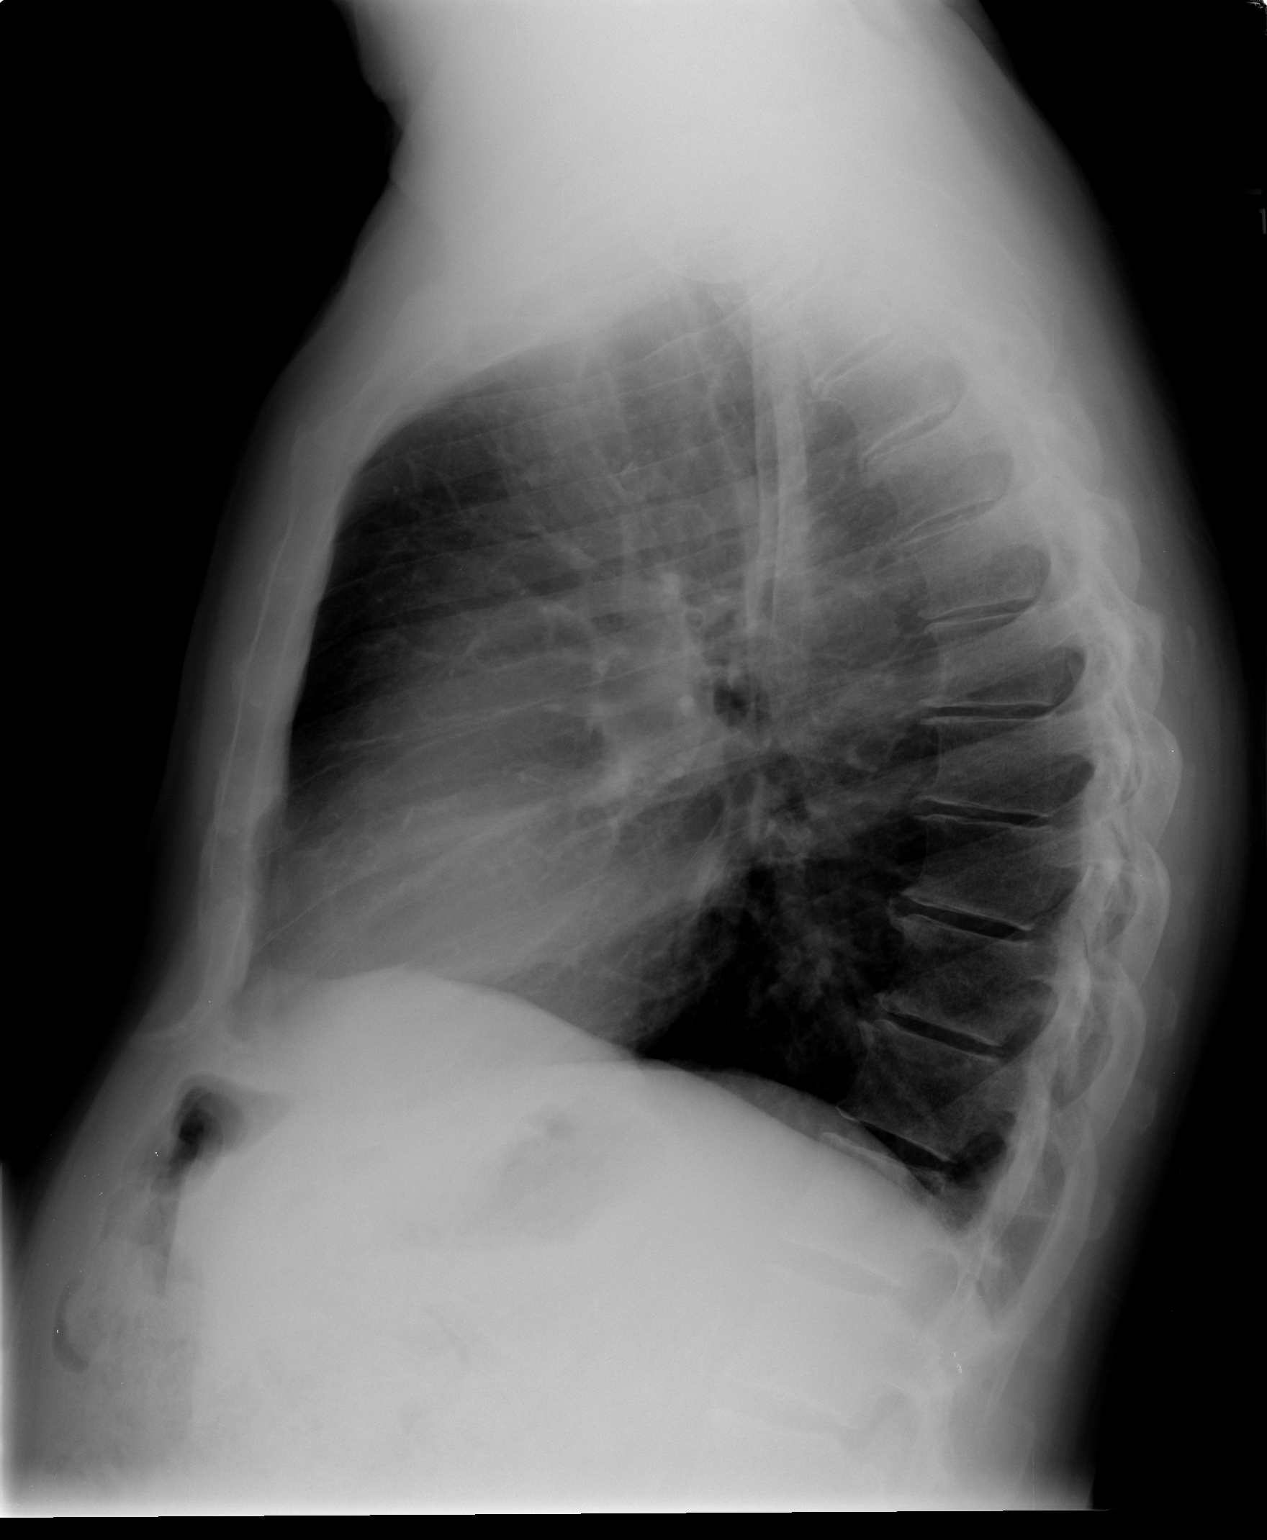

[2 of 2 positions shown; findings below may reference images not displayed]

FINDINGS: Borderline cardiomegaly and again noted.  No acute
infiltrate or pleural effusion.  No pulmonary edema. Mild
degenerative changes thoracic spine.
IMPRESSION: No active disease.  No significant change.

## 2013-04-28 ENCOUNTER — Ambulatory Visit (INDEPENDENT_AMBULATORY_CARE_PROVIDER_SITE_OTHER): Payer: Medicare Other | Admitting: Gastroenterology

## 2013-04-28 DIAGNOSIS — D649 Anemia, unspecified: Secondary | ICD-10-CM

## 2013-05-05 NOTE — Progress Notes (Signed)
Quick Note:  His ferritin is low at 19, iron normal at 101. 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 He did not want to pursue a colonoscopy at time of visit, but he was agreeable to an EGD. I strongly recommend a colonoscopy +/- EGD in near future due to occult blood in stool, persistent anemia, and early IDA. Is he willing to do the colonoscopy? ______

## 2013-05-06 ENCOUNTER — Encounter: Payer: Self-pay | Admitting: Family Medicine

## 2013-05-11 ENCOUNTER — Telehealth: Payer: Self-pay | Admitting: Internal Medicine

## 2013-05-11 NOTE — Telephone Encounter (Signed)
Pt calling to see what his results are on his ifobt and blood work he had done recently. [id number]

## 2013-05-12 NOTE — Telephone Encounter (Signed)
Spoke with pt about his results.  

## 2013-05-12 NOTE — Progress Notes (Signed)
Quick Note:  Pt is aware. He stated he still wasn't sure about scheduling a procedure. He wants to think about it and will let us know. Advised pt that if he waits for more than 30 days after his office visit we will have to bring him in for another office visit. Pt verbalized understanding. ______

## 2013-05-18 NOTE — Progress Notes (Signed)
Quick Note:  Let's send him a letter to remind him to call us.  I don't want him to slip through the cracks. ______

## 2013-05-20 ENCOUNTER — Other Ambulatory Visit: Payer: Self-pay | Admitting: Family Medicine

## 2013-05-21 ENCOUNTER — Other Ambulatory Visit: Payer: Self-pay | Admitting: *Deleted

## 2013-05-21 NOTE — Telephone Encounter (Signed)
Medication refill refused - defer to PCP

## 2013-05-26 ENCOUNTER — Telehealth: Payer: Self-pay | Admitting: Cardiology

## 2013-05-26 MED ORDER — CITALOPRAM HYDROBROMIDE 10 MG PO TABS: 10.0000 mg | ORAL_TABLET | Freq: Every day | ORAL | Status: DC

## 2013-05-26 NOTE — Telephone Encounter (Signed)
Message forwarded to Dr. Ellyn Hack.  Paper chart requested.

## 2013-05-26 NOTE — Telephone Encounter (Signed)
This is really a medicine that should go through his PCP.  I can fill it this time, but will defer to pcp for future.

## 2013-05-26 NOTE — Telephone Encounter (Signed)
Returned call and left message w/ male answering for pt to call back today before 4pm and ask for [employer].  Agreed w/ plan.  Stated pt is at the "Y."

## 2013-05-26 NOTE — Telephone Encounter (Addendum)
Pharmacy has faxed refill request 3 times for  Citalopram. Patient would like to get his meds. [facility] in [place].

## 2013-05-26 NOTE — Telephone Encounter (Signed)
Notified by operator that pt called back.  Returned call and informed pt per instructions by MD/PA.  Pt verbalized understanding and agreed w/ plan.  Pt also asked when was he supposed to f/u.  Informed he was supposed to see PA in 6 mo from Feb. 2014 and Dr. Ellyn Hack in 12 mo.  Pt verbalized understanding and stated he will wait to see Dr. Ellyn Hack b/c he doesn't have any problems.  Will call back in late Nov/Dec for appt.  ? Interaction w/ omeprazole r/t increase in toxicity of citalopram.  Per Dr. Ellyn Hack, decrease dose by half.  Call to pt and informed.  Verbalized understanding.  Refill(s) sent to pharmacy.

## 2013-06-08 ENCOUNTER — Other Ambulatory Visit: Payer: Self-pay | Admitting: Family Medicine

## 2013-06-15 ENCOUNTER — Other Ambulatory Visit: Payer: Self-pay | Admitting: Family Medicine

## 2013-07-06 ENCOUNTER — Other Ambulatory Visit: Payer: Self-pay | Admitting: Family Medicine

## 2013-07-06 MED ORDER — CITALOPRAM HYDROBROMIDE 10 MG PO TABS: 10.0000 mg | ORAL_TABLET | Freq: Every day | ORAL | Status: DC

## 2013-07-06 NOTE — Telephone Encounter (Signed)
Rx Refilled  

## 2013-07-10 ENCOUNTER — Encounter: Payer: Self-pay | Admitting: Family Medicine

## 2013-07-10 ENCOUNTER — Ambulatory Visit (INDEPENDENT_AMBULATORY_CARE_PROVIDER_SITE_OTHER): Payer: Medicare Other | Admitting: Family Medicine

## 2013-07-10 VITALS — BP 130/70 | HR 56 | Temp 97.5°F | Resp 12 | Ht 68.0 in | Wt 155.0 lb

## 2013-07-10 DIAGNOSIS — H6123 Impacted cerumen, bilateral: Secondary | ICD-10-CM

## 2013-07-10 DIAGNOSIS — D649 Anemia, unspecified: Secondary | ICD-10-CM

## 2013-07-10 DIAGNOSIS — H6120 Impacted cerumen, unspecified ear: Secondary | ICD-10-CM

## 2013-07-10 LAB — CBC WITH DIFFERENTIAL/PLATELET
Basophils Relative: 0 % (ref 0–1)
Eosinophils Absolute: 0.2 10*3/uL (ref 0.0–0.7)
HCT: 39.9 % (ref 39.0–52.0)
Hemoglobin: 13.6 g/dL (ref 13.0–17.0)
MCH: 31.6 pg (ref 26.0–34.0)
MCHC: 34.1 g/dL (ref 30.0–36.0)
Monocytes Absolute: 0.5 10*3/uL (ref 0.1–1.0)
Monocytes Relative: 7 % (ref 3–12)

## 2013-07-10 LAB — IRON: Iron: 104 ug/dL (ref 42–165)

## 2013-07-10 NOTE — Progress Notes (Signed)
Subjective:    Patient ID: Bradley Black, male    DOB: 1946-04-20, 67 y.o.   MRN: [id number]  HPI  This summer, the patient was diagnosed with iron deficiency anemia. He was referred to gastroenterology recommended an endoscopy and colonoscopy. However the patient never went through procedures. He cannot tolerate the GoLYTELY bowel prep. Per the patient, and GI did not offer any other type of bowel prep. He is here today to recheck his blood counts and his iron level he is also complaining of hearing loss bilaterally due to cerumen impaction. Patient is on quite frequently and he requested we irrigate his ear canals. Past Medical History  Diagnosis Date  . Diabetes mellitus   . Hypertension   . Coronary artery disease   . Acute myocardial infarction 1988  . Hyperlipidemia   . Benign prostatic hypertrophy   . Back pain   . Cyst     right shoulder  . 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 GERD (gastroesophageal reflux disease)   . Chronic kidney disease    Current Outpatient Prescriptions on File Prior to Visit  Medication Sig Dispense Refill  . aspirin EC 81 MG tablet Take 81 mg by mouth daily.      . citalopram (CELEXA) 10 MG tablet Take 1 tablet (10 mg total) by mouth daily.  30 tablet  3  . doxazosin (CARDURA) 4 MG tablet TAKE ONE TABLET BY MOUTH EVERY DAY  30 tablet  2  . EXFORGE 10-320 MG per tablet TAKE 1 TABLET BY MOUTH EVERY DAY  30 tablet  0  . ferrous sulfate 325 (65 FE) MG tablet Take 65 mg by mouth daily with breakfast.      . JANUVIA 50 MG tablet TAKE 1 TABLET BY MOUTH EVERY DAY  30 tablet  5  . naproxen (NAPROSYN) 500 MG tablet Take 500 mg by mouth once.      [name] omeprazole (PRILOSEC) 40 MG capsule Take 1 capsule (40 mg total) by mouth daily.  30 capsule  11  . pravastatin (PRAVACHOL) 20 MG tablet Take 1 tablet (20 mg total) by mouth daily.  30 tablet  5   No current facility-administered medications on  file prior to visit.   No Known Allergies History   Social History  . Marital Status: Married    Spouse Name: N/A    Number of Children: N/A  . Years of Education: N/A   Occupational History  . Not on file.   Social History Main Topics  . Smoking status: Former Smoker    Types: Cigarettes    Quit date: 10/01/1986  . Smokeless tobacco: Never Used     Comment: quit about 30 yrs ago  . Alcohol Use: No  . Drug Use: No  . 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 Not on file   Social History Narrative  . No narrative on file     Review of Systems  All other systems reviewed and are negative.       Objective:   Physical Exam  Vitals reviewed. Cardiovascular: Normal rate and regular rhythm.   Pulmonary/Chest: Effort normal and breath sounds normal.  Abdominal: Soft. Bowel sounds are normal.   patient has bilateral cerumen impactions  Assessment & Plan:  1. Anemia Recheck a CBC and iron level. The patient has been taking iron sulfate 325 mg by mouth daily since the summer. I will also have my office contact the gastroenterologist and see if there are any other bowel preps that are acceptable besides GoLYTELY (i.e. Mg citrate, etc).  If so the patient would like to scheduled endoscopy and colonoscopy as soon as possible. - CBC with Differential - Iron  2. Cerumen impaction, bilateral The cerumen impaction was removed irrigation without complication

## 2013-07-14 ENCOUNTER — Other Ambulatory Visit: Payer: Self-pay | Admitting: Family Medicine

## 2013-09-29 ENCOUNTER — Telehealth: Payer: Self-pay | Admitting: Family Medicine

## 2013-09-29 MED ORDER — PRAVASTATIN SODIUM 20 MG PO TABS: 20.0000 mg | ORAL_TABLET | Freq: Every day | ORAL | Status: DC

## 2013-09-29 NOTE — Telephone Encounter (Signed)
Medication refilled per protocol. 

## 2013-10-13 ENCOUNTER — Encounter: Payer: Self-pay | Admitting: Cardiology

## 2013-10-13 ENCOUNTER — Telehealth: Payer: Self-pay | Admitting: Family Medicine

## 2013-10-13 ENCOUNTER — Ambulatory Visit (INDEPENDENT_AMBULATORY_CARE_PROVIDER_SITE_OTHER): Payer: Medicare Other | Admitting: Cardiology

## 2013-10-13 VITALS — BP 128/62 | HR 57 | Ht 68.0 in | Wt 162.0 lb

## 2013-10-13 DIAGNOSIS — I2510 Atherosclerotic heart disease of native coronary artery without angina pectoris: Secondary | ICD-10-CM

## 2013-10-13 DIAGNOSIS — I255 Ischemic cardiomyopathy: Secondary | ICD-10-CM

## 2013-10-13 DIAGNOSIS — I359 Nonrheumatic aortic valve disorder, unspecified: Secondary | ICD-10-CM

## 2013-10-13 DIAGNOSIS — E785 Hyperlipidemia, unspecified: Secondary | ICD-10-CM

## 2013-10-13 DIAGNOSIS — I358 Other nonrheumatic aortic valve disorders: Secondary | ICD-10-CM

## 2013-10-13 DIAGNOSIS — I219 Acute myocardial infarction, unspecified: Secondary | ICD-10-CM

## 2013-10-13 DIAGNOSIS — I2589 Other forms of chronic ischemic heart disease: Secondary | ICD-10-CM

## 2013-10-13 DIAGNOSIS — Z951 Presence of aortocoronary bypass graft: Secondary | ICD-10-CM

## 2013-10-13 DIAGNOSIS — I10 Essential (primary) hypertension: Secondary | ICD-10-CM

## 2013-10-13 MED ORDER — DOXAZOSIN MESYLATE 4 MG PO TABS: 4.0000 mg | ORAL_TABLET | Freq: Every day | ORAL | Status: DC

## 2013-10-13 NOTE — Patient Instructions (Signed)
Your physician has requested that you have an echocardiogram. Echocardiography is a painless test that uses sound waves to create images of your heart. It provides your doctor with information about the size and shape of your heart and how well your heart's chambers and valves are working. This procedure takes approximately one hour. There are no restrictions for this procedure.  If echo is abnormal will see you sooner.  Your physician wants you to follow-up in 12 months Dr Ellyn Hack.  You will receive a reminder letter in the mail two months in advance. If you don't receive a letter, please call our office to schedule the follow-up appointment.

## 2013-10-13 NOTE — Progress Notes (Signed)
PATIENT: Bradley Black MRN: [id number]  DOB: 1946-09-12   DOV:10/18/2013 PCP: Odette Fraction, MD  Clinic Note: Chief Complaint  Patient presents with  . Annual Exam    C/o back pain, otherwise no complaints   HPI: Bradley Black is a 68 y.o.  male with a PMH below who presents today for annual followup of CAD. He is a long-standing cardiac history dating back to 1980, and essentially culminating with CABG in March of 2013 after multiple rounds of PTCA and stenting ranging from 1988-2001.  Interval History: He presents today feeling quite well without major complaints. His beta blocker was stopped by his primary provider due to bradycardia, and they're now and he feels a "skipped beat" that happens "every so often" but nothing that feels like an arrhythmia or DOB the symptoms of lightheadedness, dizziness, weakness or syncope. He does his best exercising at the gym on the treadmill, but is limited some by back pain it goes down his left leg. He is currently seeing a chiropractor for that now. Of late he does not know does much activity as he wanted to. Exercise is a hard time getting his heart rate up. He denies any chest tightness chest pressure with rest or exertion, but does feel fatigued. No dyspnea either with rest or exertion. No PND, orthopnea or edema. No TIA or amaurosis fugax symptoms. No melena cheese or hematuria. No signs of each  Past Medical History  Diagnosis Date  . Acute myocardial infarction, History of: 1988    PTCA of OM; PTCA   . CAD S/P percutaneous coronary angioplasty 1988 - 2001    PTCA of [place]; PCI with BMS to LAD in 1997; RCA PCI BMS 1999 & 200  . CAD in native artery 11/2011    Cath: MV Dz -- prox LAD with D1&2, Large OM (both branches) & diffuse RCA disease --> CABG x 4  . 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 Ischemic cardiomyopathy 11/2011    Intra-OP TEE: EF 40-45%, no regional WMA;  improved Anterior WM post CABG.  . DM (diabetes mellitus), type 2 with renal complications   . Benign essential HTN     right shoulder  . Hyperlipidemia LDL goal <70   . CKD (chronic kidney disease), stage III   . BPH (benign prostatic hyperplasia)   . Cyst of bursa     R shoulder  . Chronic back pain   . GERD (gastroesophageal reflux disease)     Prior Cardiac Evaluation and Past Surgical History: Past Surgical History  Procedure Laterality Date  . Back surgery  1970  . Mass excision  10/24/11    R arm  . Lumbar fusion    . Coronary angioplasty  1994    OM  . Coronary angioplasty  1997    LAD  . Coronary angioplasty with stent placement  1999    RCA  . Coronary angioplasty with stent placement  2001    RCA  . Incision and drainage of wound  2006    axilla  . Hernia repair    . Coronary artery bypass graft  12/27/2011    Procedure: CORONARY ARTERY BYPASS GRAFTING (CABG);  Surgeon: Rexene Alberts, MD;  Location: [facility];  Service: Open Heart Surgery;  Laterality: N/A;  Times four. On pump. Using endoscopically harvested right greater saphenous vein  and left internal mammary artery.   . Intraoperative transesophageal echocardiogram  12/27/2011    Global hypokinesis with EF of 40-45%, improved LAD distribution wall motion.    No Known Allergies  Current Outpatient Prescriptions  Medication Sig Dispense Refill  . amLODipine-valsartan (EXFORGE) 10-320 MG per tablet Take 0.5 tablet by mouth once daily.      [name] aspirin EC 81 MG tablet Take 81 mg by mouth daily.      . citalopram (CELEXA) 10 MG tablet Take 1 tablet (10 mg total) by mouth daily.  30 tablet  3  . ferrous sulfate 325 (65 FE) MG tablet Take 65 mg by mouth daily with breakfast.      . JANUVIA 50 MG tablet TAKE 1 TABLET BY MOUTH EVERY DAY  30 tablet  5  . naproxen (NAPROSYN) 500 MG tablet Take 500 mg by mouth once.      . Omega-3 Fatty Acids (FISH OIL) 1000 MG CAPS Take 1,000 mg by mouth 2 (two) times daily.      [name]  omeprazole (PRILOSEC) 40 MG capsule Take 1 capsule (40 mg total) by mouth daily.  30 capsule  11  . pravastatin (PRAVACHOL) 20 MG tablet Take 1 tablet (20 mg total) by mouth daily.  30 tablet  5  . doxazosin (CARDURA) 4 MG tablet Take 1 tablet (4 mg total) by mouth daily.  30 tablet  2   No current facility-administered medications for this visit.  BB d/c'd by PCP due to bradycardia.  History   Social History Narrative   Married father of one grandfather 89. Works out at [employer] at [employer] roughly 2-3 days a week. He works on a treadmill. He does note having a hard time getting his heart rate up.   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   He quit smoking in 1988, and does not drink alcohol.    ROS: A comprehensive Review of Systems - Negative except Symptoms noted above in history of present illness  PHYSICAL EXAM BP 128/62  Pulse 57  Ht 5\' 8"  (1.727 m)  Wt 162 lb (73.483 kg)  BMI 24.64 kg/m2 General appearance: alert, cooperative, appears stated age, no distress and Well-nourished and well-groomed. Answers questions appropriately Neck: no adenopathy, no carotid bruit, no JVD, supple, symmetrical, trachea midline and thyroid not enlarged, symmetric, no tenderness/mass/nodules Lungs: clear to auscultation bilaterally, normal percussion bilaterally and Nonlabored, and good air movement. Heart: RRR, normal S1 and S2 with a soft 1/6 SM heard RUSB, nondisplaced PMI. Otherwise no M./R./G. Abdomen: soft, non-tender; bowel sounds normal; no masses,  no organomegaly Extremities: extremities normal, atraumatic, no cyanosis or edema and no edema, redness or tenderness in the calves or thighs Pulses: 2+ and symmetric Neurologic: Alert and oriented X 3, normal strength and tone. Normal symmetric reflexes. Normal coordination and gait  [id number] today: Yes Rate:57 , Rhythm: S Brady, freq PVC in RBBB pattern; CRO Inf MI age undetermined.  Recent Labs: Non since June 2014  ASSESSMENT  / PLAN: 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 After multiple interventions, he then went for CABG in March 2013. He is not having any anginal symptoms or heart failure symptoms. He is currently not a beta blocker because of bradycardia and his heart rate is still in the 50s. He is taking a statin, aspirin and is on a combination ARB/calcium blocker. He has not had a stress test since his CABG in 2013, but has been relatively asymptomatic. We  will probably get one at roughly 4 years postop when the symptoms dictate otherwise.  Ischemic cardiomyopathy The last known evaluation of his EF was perioperative transesophageal echo. I don't have a transthoracic echo since to see if there is any additional improvement in his EF. He is not having any heart failure symptoms either systolic or diastolic.  Plan: 2-D echocardiogram to assess EF  Acute myocardial infarction, History of: There was evidence of an inferior scar with EF 45% by Myoview in 2009.  This would probably correlate with either the RCA or the OM and the culprit for the MI he had in the past.  No further anginal pains.  Benign essential HTN Blood pressure is pretty well controlled despite not being on the beta blocker. He is on ARB plus calcium channel blocker.  Aortic heart murmur This sounds like an aortic sclerosis murmur. However since we're getting an echocardiogram to assess his EF which can also evaluate the murmur.  Hyperlipidemia LDL goal <70 Lipids from May 2014 the total of all 114, LDL 55, HDL 40 and triglycerides 97. Pre-well-controlled on 20 mg a pravastatin. I think these are being followed by his PCP. With his clothes he is to goal, the next step would be to check an NMR panel to ensure that his LDL article number and is intact in the LDL correlate.    Orders Placed This Encounter  Procedures  . EKG 12-Lead  . 2D Echocardiogram without contrast    Standing Status: Future     Number of Occurrences:      Standing  Expiration Date: 10/13/2014    Scheduling Instructions:     Schedule at next  available    Order Specific Question:  Type of Echo    Answer:  Complete    Order Specific Question:  Where should this test be performed    Answer:  [facility]    Order Specific Question:  Reason for exam-Echo    Answer:  Cardiomyopathy-Ischemic  414.8    Order Specific Question:  Reason for exam-Echo    Answer:  Murmur  785.2    Order Specific Question:  Reason for exam-Echo    Answer:  CAD Native Vessel  414.01   Followup: One year  Rhilee Currin W. Ellyn Hack, M.D., M.S. [facility] [address] [place], [place]  [address]  [phone] Pager # [phone]

## 2013-10-13 NOTE — Telephone Encounter (Signed)
Medication refilled per protocol. 

## 2013-10-16 ENCOUNTER — Telehealth: Payer: Self-pay | Admitting: Cardiology

## 2013-10-16 NOTE — Telephone Encounter (Signed)
Pt called to request price of 2D echo and how explanation of his benefits and what would be his responsible amount.  Explained he had no deductible but had $4,900 out of pocket.  He would need to meet the out of pocket first before his insurance would pay on any claim.  Quoted approximate of $2,079 and that he will be responsible for this entire amount but that it would be applied and credited to his out of pocket .  Advised pt of [facility] payment plan option once he receives his bill for account balance.  Pt understands and satisfied w/explanation.

## 2013-10-18 ENCOUNTER — Encounter: Payer: Self-pay | Admitting: Cardiology

## 2013-10-18 DIAGNOSIS — I219 Acute myocardial infarction, unspecified: Secondary | ICD-10-CM | POA: Insufficient documentation

## 2013-10-18 DIAGNOSIS — I10 Essential (primary) hypertension: Secondary | ICD-10-CM | POA: Insufficient documentation

## 2013-10-18 DIAGNOSIS — E785 Hyperlipidemia, unspecified: Secondary | ICD-10-CM | POA: Insufficient documentation

## 2013-10-18 DIAGNOSIS — I358 Other nonrheumatic aortic valve disorders: Secondary | ICD-10-CM | POA: Insufficient documentation

## 2013-10-18 NOTE — Assessment & Plan Note (Signed)
Blood pressure is pretty well controlled despite not being on the beta blocker. He is on ARB plus calcium channel blocker.

## 2013-10-18 NOTE — Assessment & Plan Note (Signed)
The last known evaluation of his EF was perioperative transesophageal echo. I don't have a transthoracic echo since to see if there is any additional improvement in his EF. He is not having any heart failure symptoms either systolic or diastolic.  Plan: 2-D echocardiogram to assess EF

## 2013-10-18 NOTE — Assessment & Plan Note (Signed)
There was evidence of an inferior scar with EF 45% by Myoview in 2009.  This would probably correlate with either the RCA or the OM and the culprit for the MI he had in the past.  No further anginal pains.

## 2013-10-18 NOTE — Assessment & Plan Note (Signed)
This sounds like an aortic sclerosis murmur. However since we're getting an echocardiogram to assess his EF which can also evaluate the murmur.

## 2013-10-18 NOTE — Assessment & Plan Note (Signed)
After multiple interventions, he then went for CABG in March 2013. He is not having any anginal symptoms or heart failure symptoms. He is currently not a beta blocker because of bradycardia and his heart rate is still in the 50s. He is taking a statin, aspirin and is on a combination ARB/calcium blocker. He has not had a stress test since his CABG in 2013, but has been relatively asymptomatic. We will probably get one at roughly 4 years postop when the symptoms dictate otherwise.

## 2013-10-18 NOTE — Assessment & Plan Note (Signed)
Lipids from May 2014 the total of all 114, LDL 55, HDL 40 and triglycerides 97. Pre-well-controlled on 20 mg a pravastatin. I think these are being followed by his PCP. With his clothes he is to goal, the next step would be to check an NMR panel to ensure that his LDL article number and is intact in the LDL correlate.

## 2013-10-20 ENCOUNTER — Ambulatory Visit (HOSPITAL_COMMUNITY): Payer: Medicare Other

## 2013-10-27 ENCOUNTER — Telehealth (HOSPITAL_COMMUNITY): Payer: Self-pay | Admitting: *Deleted

## 2013-10-27 NOTE — Telephone Encounter (Signed)
Message forwarded to [facility] in Ancillary Scheduling.

## 2013-10-27 NOTE — Telephone Encounter (Signed)
I have an order for this patient to have a Lexiscan in Feb-March(green sheet). Pt however just saw Dr. Ellyn Hack and the only test ordered was an echo. Does the doctor still want the patient to have a stress test? Please advise before I call the patient.

## 2013-10-27 NOTE — Telephone Encounter (Signed)
Message forwarded to Dr. Sharlyn Bologna, RN.

## 2013-10-27 NOTE — Telephone Encounter (Signed)
Don't need Nuclear ST unless Echo looks worse.  Can cancel.  ,Leonie Man, MD

## 2013-11-28 ENCOUNTER — Other Ambulatory Visit: Payer: Self-pay | Admitting: Family Medicine

## 2013-12-14 ENCOUNTER — Other Ambulatory Visit: Payer: Self-pay | Admitting: Family Medicine

## 2014-01-12 ENCOUNTER — Other Ambulatory Visit: Payer: Self-pay | Admitting: Family Medicine

## 2014-01-12 NOTE — Telephone Encounter (Signed)
Medication refilled per protocol. 

## 2014-01-27 ENCOUNTER — Other Ambulatory Visit: Payer: Self-pay | Admitting: Family Medicine

## 2014-02-11 ENCOUNTER — Ambulatory Visit (INDEPENDENT_AMBULATORY_CARE_PROVIDER_SITE_OTHER): Payer: Medicare Other | Admitting: Physician Assistant

## 2014-02-11 ENCOUNTER — Encounter: Payer: Self-pay | Admitting: Physician Assistant

## 2014-02-11 ENCOUNTER — Telehealth: Payer: Self-pay | Admitting: Family Medicine

## 2014-02-11 VITALS — BP 134/82 | HR 60 | Temp 98.6°F | Resp 18 | Wt 160.0 lb

## 2014-02-11 DIAGNOSIS — M542 Cervicalgia: Secondary | ICD-10-CM

## 2014-02-11 MED ORDER — TIZANIDINE HCL 4 MG PO TABS: 4.0000 mg | ORAL_TABLET | Freq: Four times a day (QID) | ORAL | Status: DC | PRN

## 2014-02-11 MED ORDER — METAXALONE 800 MG PO TABS: 800.0000 mg | ORAL_TABLET | Freq: Three times a day (TID) | ORAL | Status: DC

## 2014-02-11 NOTE — Telephone Encounter (Signed)
Insurance does not cover Metaxalone order today at visit.  Provider is switching to approved Tizanidine 4 mg one by mouth Q6hr PRN #60 no refills.  [facility] called and given verbal RX
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
## 2014-02-19 ENCOUNTER — Encounter: Payer: Self-pay | Admitting: Family Medicine

## 2014-02-19 ENCOUNTER — Ambulatory Visit (HOSPITAL_COMMUNITY)
Admission: RE | Admit: 2014-02-19 | Discharge: 2014-02-19 | Disposition: A | Discharge status: Home / Self Care | Payer: Medicare Other | Source: Ambulatory Visit | Attending: Family Medicine | Admitting: Family Medicine

## 2014-02-19 ENCOUNTER — Ambulatory Visit (INDEPENDENT_AMBULATORY_CARE_PROVIDER_SITE_OTHER): Payer: Medicare Other | Admitting: Family Medicine

## 2014-02-19 VITALS — BP 132/68 | HR 50 | Temp 97.1°F | Resp 14 | Ht 68.0 in | Wt 160.0 lb

## 2014-02-19 DIAGNOSIS — M542 Cervicalgia: Secondary | ICD-10-CM

## 2014-02-19 DIAGNOSIS — S139XXA Sprain of joints and ligaments of unspecified parts of neck, initial encounter: Secondary | ICD-10-CM

## 2014-02-19 DIAGNOSIS — M5030 Other cervical disc degeneration, unspecified cervical region: Secondary | ICD-10-CM | POA: Insufficient documentation

## 2014-02-19 DIAGNOSIS — S134XXA Sprain of ligaments of cervical spine, initial encounter: Secondary | ICD-10-CM

## 2014-02-19 MED ORDER — DIAZEPAM 10 MG PO TABS: 10.0000 mg | ORAL_TABLET | Freq: Three times a day (TID) | ORAL | Status: DC | PRN

## 2014-02-19 MED ORDER — PREDNISONE 20 MG PO TABS: ORAL_TABLET | ORAL | Status: DC
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
## 2014-02-25 ENCOUNTER — Encounter: Payer: Medicare Other | Admitting: Family Medicine

## 2014-03-01 ENCOUNTER — Encounter: Payer: Self-pay | Admitting: Family Medicine

## 2014-03-01 ENCOUNTER — Other Ambulatory Visit: Payer: Self-pay | Admitting: Family Medicine

## 2014-03-02 ENCOUNTER — Telehealth: Payer: Self-pay | Admitting: *Deleted

## 2014-03-02 DIAGNOSIS — M542 Cervicalgia: Secondary | ICD-10-CM

## 2014-03-02 NOTE — Telephone Encounter (Signed)
Ok with PT referral 
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
## 2014-03-02 NOTE — Telephone Encounter (Signed)
Referral done

## 2014-03-08 ENCOUNTER — Ambulatory Visit (HOSPITAL_COMMUNITY)
Admission: RE | Admit: 2014-03-08 | Discharge: 2014-03-08 | Disposition: A | Discharge status: Home / Self Care | Payer: Medicare Other | Source: Ambulatory Visit | Attending: Family Medicine | Admitting: Family Medicine

## 2014-03-08 DIAGNOSIS — N183 Chronic kidney disease, stage 3 unspecified: Secondary | ICD-10-CM | POA: Insufficient documentation

## 2014-03-08 DIAGNOSIS — I129 Hypertensive chronic kidney disease with stage 1 through stage 4 chronic kidney disease, or unspecified chronic kidney disease: Secondary | ICD-10-CM | POA: Insufficient documentation

## 2014-03-08 DIAGNOSIS — E785 Hyperlipidemia, unspecified: Secondary | ICD-10-CM | POA: Insufficient documentation

## 2014-03-08 DIAGNOSIS — M542 Cervicalgia: Secondary | ICD-10-CM | POA: Insufficient documentation

## 2014-03-08 DIAGNOSIS — E119 Type 2 diabetes mellitus without complications: Secondary | ICD-10-CM | POA: Insufficient documentation

## 2014-03-08 DIAGNOSIS — S134XXA Sprain of ligaments of cervical spine, initial encounter: Secondary | ICD-10-CM | POA: Insufficient documentation

## 2014-03-08 DIAGNOSIS — IMO0001 Reserved for inherently not codable concepts without codable children: Secondary | ICD-10-CM | POA: Insufficient documentation

## 2014-03-08 DIAGNOSIS — Z951 Presence of aortocoronary bypass graft: Secondary | ICD-10-CM | POA: Insufficient documentation
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
## 2014-03-11 ENCOUNTER — Ambulatory Visit (HOSPITAL_COMMUNITY)
Admission: RE | Admit: 2014-03-11 | Discharge: 2014-03-11 | Disposition: A | Discharge status: Home / Self Care | Payer: Medicare Other | Source: Ambulatory Visit | Attending: Family Medicine | Admitting: Family Medicine

## 2014-03-11 DIAGNOSIS — M542 Cervicalgia: Secondary | ICD-10-CM

## 2014-03-11 DIAGNOSIS — S134XXA Sprain of ligaments of cervical spine, initial encounter: Secondary | ICD-10-CM
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
## 2014-03-15 ENCOUNTER — Ambulatory Visit (HOSPITAL_COMMUNITY)
Admission: RE | Admit: 2014-03-15 | Discharge: 2014-03-15 | Disposition: A | Discharge status: Home / Self Care | Payer: Medicare Other | Source: Ambulatory Visit | Attending: Family Medicine | Admitting: Family Medicine

## 2014-03-15 DIAGNOSIS — S134XXA Sprain of ligaments of cervical spine, initial encounter: Secondary | ICD-10-CM

## 2014-03-15 DIAGNOSIS — M542 Cervicalgia: Secondary | ICD-10-CM
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
## 2014-03-18 ENCOUNTER — Telehealth (HOSPITAL_COMMUNITY): Payer: Self-pay

## 2014-03-18 ENCOUNTER — Ambulatory Visit (HOSPITAL_COMMUNITY)
Admission: RE | Admit: 2014-03-18 | Discharge: 2014-03-18 | Disposition: A | Discharge status: Home / Self Care | Payer: Medicare Other | Source: Ambulatory Visit | Attending: Family Medicine | Admitting: Family Medicine
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
## 2014-03-22 ENCOUNTER — Ambulatory Visit (HOSPITAL_COMMUNITY): Payer: Medicare Other

## 2014-03-25 ENCOUNTER — Other Ambulatory Visit: Payer: Medicare Other

## 2014-03-25 ENCOUNTER — Ambulatory Visit (HOSPITAL_COMMUNITY)
Admission: RE | Admit: 2014-03-25 | Discharge: 2014-03-25 | Disposition: A | Discharge status: Home / Self Care | Payer: Medicare Other | Source: Ambulatory Visit | Attending: Family Medicine | Admitting: Family Medicine

## 2014-03-25 DIAGNOSIS — E119 Type 2 diabetes mellitus without complications: Secondary | ICD-10-CM

## 2014-03-25 DIAGNOSIS — Z0000 Encounter for general adult medical examination without abnormal findings: Secondary | ICD-10-CM

## 2014-03-25 DIAGNOSIS — I10 Essential (primary) hypertension: Secondary | ICD-10-CM

## 2014-03-25 DIAGNOSIS — S134XXA Sprain of ligaments of cervical spine, initial encounter: Secondary | ICD-10-CM

## 2014-03-25 DIAGNOSIS — M542 Cervicalgia: Secondary | ICD-10-CM

## 2014-03-25 LAB — HEMOGLOBIN A1C
HEMOGLOBIN A1C: 6.8 % — AB (ref <5.7)
MEAN PLASMA GLUCOSE: 148 mg/dL — AB (ref <117)

## 2014-03-25 LAB — CBC WITH DIFFERENTIAL/PLATELET
BASOS PCT: 0 % (ref 0–1)
Basophils Absolute: 0 10*3/uL (ref 0.0–0.1)
Eosinophils Absolute: 0.3 10*3/uL (ref 0.0–0.7)
Eosinophils Relative: 5 % (ref 0–5)
HEMATOCRIT: 37.6 % — AB (ref 39.0–52.0)
Hemoglobin: 12.7 g/dL — ABNORMAL LOW (ref 13.0–17.0)
LYMPHS ABS: 1.1 10*3/uL (ref 0.7–4.0)
LYMPHS PCT: 17 % (ref 12–46)
MCH: 31.4 pg (ref 26.0–34.0)
MCHC: 33.8 g/dL (ref 30.0–36.0)
MCV: 93.1 fL (ref 78.0–100.0)
MONOS PCT: 8 % (ref 3–12)
Monocytes Absolute: 0.5 10*3/uL (ref 0.1–1.0)
NEUTROS ABS: 4.6 10*3/uL (ref 1.7–7.7)
NEUTROS PCT: 70 % (ref 43–77)
Platelets: 170 10*3/uL (ref 150–400)
RBC: 4.04 MIL/uL — AB (ref 4.22–5.81)
RDW: 14.1 % (ref 11.5–15.5)
WBC: 6.6 10*3/uL (ref 4.0–10.5)

## 2014-03-25 LAB — COMPLETE METABOLIC PANEL WITH GFR
ALBUMIN: 3.9 g/dL (ref 3.5–5.2)
ALK PHOS: 65 U/L (ref 39–117)
ALT: 13 U/L (ref 0–53)
AST: 12 U/L (ref 0–37)
BUN: 33 mg/dL — ABNORMAL HIGH (ref 6–23)
CO2: 23 meq/L (ref 19–32)
Calcium: 9.9 mg/dL (ref 8.4–10.5)
Chloride: 109 mEq/L (ref 96–112)
Creat: 1.76 mg/dL — ABNORMAL HIGH (ref 0.50–1.35)
GFR, Est African American: 44 mL/min — ABNORMAL LOW
GFR, Est Non African American: 38 mL/min — ABNORMAL LOW
GLUCOSE: 130 mg/dL — AB (ref 70–99)
Potassium: 4.4 mEq/L (ref 3.5–5.3)
Sodium: 140 mEq/L (ref 135–145)
Total Bilirubin: 1.4 mg/dL — ABNORMAL HIGH (ref 0.2–1.2)
Total Protein: 6.5 g/dL (ref 6.0–8.3)

## 2014-03-25 LAB — LIPID PANEL
CHOLESTEROL: 125 mg/dL (ref 0–200)
HDL: 36 mg/dL — AB (ref >39)
LDL Cholesterol: 75 mg/dL (ref 0–99)
Total CHOL/HDL Ratio: 3.5 Ratio
Triglycerides: 71 mg/dL (ref <150)
VLDL: 14 mg/dL (ref 0–40)
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
## 2014-03-26 LAB — PSA, MEDICARE: PSA: 1.04 ng/mL (ref <=4.00)

## 2014-03-29 ENCOUNTER — Ambulatory Visit (HOSPITAL_COMMUNITY)
Admission: RE | Admit: 2014-03-29 | Discharge: 2014-03-29 | Disposition: A | Discharge status: Home / Self Care | Payer: Medicare Other | Source: Ambulatory Visit | Attending: Family Medicine | Admitting: Family Medicine
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
## 2014-03-30 ENCOUNTER — Encounter: Payer: Self-pay | Admitting: Family Medicine

## 2014-03-30 ENCOUNTER — Other Ambulatory Visit: Payer: Self-pay | Admitting: Family Medicine

## 2014-03-30 ENCOUNTER — Ambulatory Visit (INDEPENDENT_AMBULATORY_CARE_PROVIDER_SITE_OTHER): Payer: Medicare Other | Admitting: Family Medicine

## 2014-03-30 VITALS — BP 150/70 | HR 58 | Temp 97.0°F | Resp 14 | Ht 68.0 in | Wt 161.0 lb

## 2014-03-30 DIAGNOSIS — R0989 Other specified symptoms and signs involving the circulatory and respiratory systems: Secondary | ICD-10-CM

## 2014-03-30 DIAGNOSIS — Z0000 Encounter for general adult medical examination without abnormal findings: Secondary | ICD-10-CM

## 2014-03-30 MED ORDER — SILDENAFIL CITRATE 100 MG PO TABS: 50.0000 mg | ORAL_TABLET | Freq: Every day | ORAL | Status: DC | PRN
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
## 2014-03-31 ENCOUNTER — Other Ambulatory Visit (HOSPITAL_COMMUNITY): Payer: Self-pay | Admitting: Cardiology

## 2014-03-31 DIAGNOSIS — R0989 Other specified symptoms and signs involving the circulatory and respiratory systems: Secondary | ICD-10-CM

## 2014-04-01 ENCOUNTER — Ambulatory Visit (HOSPITAL_COMMUNITY)
Admission: RE | Admit: 2014-04-01 | Discharge: 2014-04-01 | Disposition: A | Discharge status: Home / Self Care | Payer: Medicare Other | Source: Ambulatory Visit | Attending: Family Medicine | Admitting: Family Medicine

## 2014-04-01 DIAGNOSIS — M542 Cervicalgia: Secondary | ICD-10-CM | POA: Diagnosis not present

## 2014-04-01 DIAGNOSIS — I129 Hypertensive chronic kidney disease with stage 1 through stage 4 chronic kidney disease, or unspecified chronic kidney disease: Secondary | ICD-10-CM | POA: Insufficient documentation

## 2014-04-01 DIAGNOSIS — E785 Hyperlipidemia, unspecified: Secondary | ICD-10-CM | POA: Diagnosis not present

## 2014-04-01 DIAGNOSIS — E119 Type 2 diabetes mellitus without complications: Secondary | ICD-10-CM | POA: Diagnosis not present

## 2014-04-01 DIAGNOSIS — Z951 Presence of aortocoronary bypass graft: Secondary | ICD-10-CM | POA: Diagnosis not present

## 2014-04-01 DIAGNOSIS — N183 Chronic kidney disease, stage 3 unspecified: Secondary | ICD-10-CM | POA: Insufficient documentation

## 2014-04-01 DIAGNOSIS — IMO0001 Reserved for inherently not codable concepts without codable children: Secondary | ICD-10-CM | POA: Diagnosis not present

## 2014-04-01 NOTE — Evaluation (Signed)
Occupational Therapy Re-Evaluation  Patient Details  Name: Bradley Black MRN: [id number] Date of Birth: 09/16/1946  Today's Date: 04/01/2014 Time: 8676-7209 OT Time Calculation (min): 40 min Manual 4709-6283 (15') ROM Measurements 1623-1631 (8') Therapeutic Exercises [phone] (18')  Visit#: 7 of 12  Re-eval: 04/29/14     Authorization: [employer]  Authorization Time Period: before 17th visit  Authorization Visit#: 7 of 31   Past Medical History:  Past Medical History  Diagnosis Date  . Acute myocardial infarction, History of: 1988    PTCA of OM; PTCA   . CAD S/P percutaneous coronary angioplasty 1988 - 2001    PTCA of [place]; PCI with BMS to LAD in 1997; RCA PCI BMS 1999 & 200  . CAD in native artery 11/2011    Cath: MV Dz -- prox LAD with D1&2, Large OM (both branches) & diffuse RCA disease --> CABG x 4  . 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 Ischemic cardiomyopathy 11/2011    Intra-OP TEE: EF 40-45%, no regional WMA; improved Anterior WM post CABG.  . DM (diabetes mellitus), type 2 with renal complications   . Benign essential HTN     right shoulder  . Hyperlipidemia LDL goal <70   . CKD (chronic kidney disease), stage III   . BPH (benign prostatic hyperplasia)   . Cyst of bursa     R shoulder  . Chronic back pain   . GERD (gastroesophageal reflux disease)    Past Surgical History:  Past Surgical History  Procedure Laterality Date  . Back surgery  1970  . Mass excision  10/24/11    R arm  . Lumbar fusion    . Coronary angioplasty  1994    OM  . Coronary angioplasty  1997    LAD  . Coronary angioplasty with stent placement  1999    RCA  . Coronary angioplasty with stent placement  2001    RCA  . Incision and drainage of wound  2006    axilla  . Hernia repair    . Coronary artery bypass graft  12/27/2011    Procedure: CORONARY ARTERY BYPASS GRAFTING (CABG);  Surgeon: Rexene Alberts, MD;  Location: [facility];  Service: Open Heart Surgery;  Laterality: N/A;  Times four. On pump. Using endoscopically harvested right greater saphenous vein and left internal mammary artery.   . Intraoperative transesophageal echocardiogram  12/27/2011    Global hypokinesis with EF of 40-45%, improved LAD distribution wall motion.    Subjective Symptoms/Limitations Symptoms: "My neck's feling better, but but backs still hurting.  but its better overall." Pain Assessment Currently in Pain?: Yes Pain Score: 3  Pain Location: Back Pain Orientation: Left;Right Pain Type: Acute pain  Assessment Additional Assessments Cervical AROM Cervical Flexion: 3.5 cm (with less pain. 4.5 previously) Cervical Extension: 18 cm (slihgt pain on left side, 17.5 previously) Cervical - Right Side Bend: 11.5 cm (no pain, previously 16.5) Cervical - Left Side Bend: 14 cm (slight pain, previously 16cm) Cervical - Right Rotation: 73 degrees (slight pain, previously 54 degrees) Cervical - Left Rotation: 68 degrees (slight pain, initial 50 degrees) Palpation Palpation: minimal fascial restricions to cervical nexk regions, but remains with moderate restricions in scapular regions (with increased tenderness on right side)     Exercise/Treatments Stretches Other Neck Stretches: Thoracic towel roll stretch; 2' hold Theraband  Exercises Scapula Retraction: 15 reps;Green Shoulder Extension: 15 reps;Green Rows: 15 reps;Green Prone Exercises Axial Exentsion: 10 reps W Back: 10 reps Shoulder Extension: 10 reps Rows: 10 reps  Manual Therapy Manual Therapy: Myofascial release Myofascial Release: Manual cervical traction, myofascial release and manual stretching to bilateral scapular regions, rhomboids, upper and mid trapezius, sternocleidomastoid, and platysmus region to decrease pain and fascial restrictions  Occupational Therapy Assessment and Plan OT Assessment and Plan Clinical Impression Statement:  Re-Evaluation completed this session.  Pt verbalizes significant improvement in neck pain, but continued to have thoracic pain.  He has met 4/4 STG and 1/4 LTG.  Pt was able to tolerate increase time with thoracic towel roll stretch this session and upgrade to green theraband. Pt will benefit from skilled therapeutic intervention in order to improve on the following deficits: Decreased range of motion;Increased fascial restricitons;Impaired flexibility;Increased muscle spasms;Pain Rehab Potential: Good OT Frequency: Min 2X/week OT Duration: 12 weeks OT Treatment/Interventions: Self-care/ADL training;Therapeutic exercise;Manual therapy;Modalities;Patient/family education;Therapeutic activities OT Plan: P: End with heat as needed. Add standing W arms and x-y arms.   Goals Short Term Goals Short Term Goal 1: Patient will be educated on a HEP. Short Term Goal 1 Progress: Met Short Term Goal 2: Patient will improve AROM in cervical region by 10% for increased ability to look right and left at stop signs. Short Term Goal 2 Progress: Met Short Term Goal 3: Patient will decrease pain in cervical region to 5/10 when completing gardening activities.  Short Term Goal 3 Progress: Met Short Term Goal 4: Patient will decrease fascial restrictions from moderate to min-mod. Short Term Goal 4 Progress: Met Long Term Goals Long Term Goal 1: Patient will return to prior level of independence with all B/IADLs and leisure activities.  Long Term Goal 1 Progress: Progressing toward goal Long Term Goal 2: Patient will improve AROM by 25% in cervical region for increased ability to look right and left at stops signs, vs turning his entire body. Long Term Goal 2 Progress: Met Long Term Goal 3: Patient will decrease pain in cervical and thoracic regions to 3/10 when completing gardening activities.  Long Term Goal 3 Progress: Progressing toward goal Long Term Goal 4: Patient will decrease fascial restrictions from  min-mod to minimal. Long Term Goal 4 Progress: Progressing toward goal  Problem List Patient Active Problem List   Diagnosis Date Noted  . Cervicalgia 03/08/2014  . Whiplash injury to neck 03/08/2014  . Aortic heart murmur 10/18/2013  . Acute myocardial infarction, History of:   [name] Benign essential HTN   . Hyperlipidemia LDL goal <70   . Anemia 04/23/2013  . 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 Ischemic cardiomyopathy 11/30/2011  . Actinic keratosis, left scalp 10/04/2011  . Seborrheic keratosis, right lateral leg 10/04/2011    End of Session Activity Tolerance: Patient tolerated treatment well General Behavior During Therapy: WFL for tasks assessed/performed  GO Functional Assessment Tool Used: FOTO current 56/100 (44% limited)  Initial 46/100 (54% limited) Functional Limitation: Other OT primary Other OT Primary Current Status (J1884): At least 40 percent but less than 60 percent impaired, limited or restricted Other OT Primary Goal Status (Z6606): At least 20 percent but less than 40 percent impaired, limited or restricted  Bea Graff, [place], OTR/L [phone]  04/01/2014, 5:07 PM  Physician Documentation Your signature is required to indicate approval of the treatment plan as stated above.  Please sign and either  send electronically or make a copy of this report for your files and return this physician signed original.  Please mark one 1.__approve of plan  2. ___approve of plan with the following conditions.   ______________________________                                                          _____________________ Physician Signature                                                                                                             Date

## 2014-04-05 ENCOUNTER — Ambulatory Visit (HOSPITAL_COMMUNITY)
Admission: RE | Admit: 2014-04-05 | Discharge: 2014-04-05 | Disposition: A | Discharge status: Home / Self Care | Payer: Medicare Other | Source: Ambulatory Visit | Attending: Family Medicine | Admitting: Family Medicine

## 2014-04-05 DIAGNOSIS — IMO0001 Reserved for inherently not codable concepts without codable children: Secondary | ICD-10-CM | POA: Diagnosis not present

## 2014-04-05 NOTE — Progress Notes (Signed)
Occupational Therapy Treatment Patient Details  Name: Bradley Black MRN: [id number] Date of Birth: Feb 06, 1946  Today's Date: 04/05/2014 Time: 6160-7371 OT Time Calculation (min): 45 min Manual 1354-1415 (21') Therapeutic Exercises 1415-1439 (24')  Visit#: 8 of 12  Re-eval: 04/29/14    Authorization: [employer]  Authorization Time Period: before 17th visit  Authorization Visit#: 8 of 17  Subjective Symptoms/Limitations Symptoms: "My neck feels pretty good, my back doesn't." Pain Assessment Currently in Pain?: Yes Pain Score: 4  Pain Location: Back Pain Orientation: Right;Left Pain Type: Acute pain  Precautions/Restrictions     Exercise/Treatments Stretches Other Neck Stretches: Thoracic towel roll stretch; 2' hold with no therapist assist to place towel Machines for Strengthening UBE (Upper Arm Bike): Level 3 for 5' in reverse Theraband Exercises Scapula Retraction: 15 reps;Green Shoulder Extension: 15 reps;Green Rows: 15 reps;Green Standing Exercises Other Standing Exercises: x-v and w arms 10 reps each in standing Supine Exercises Other Supine Exercise: supine serratus anterior punch 10 times with 1# weight Other Supine Exercise: shoulder protraction suipne 10x with 1# weight Prone Exercises Axial Exentsion: 10 reps W Back: 10 reps Shoulder Extension: 10 reps Rows: 10 reps Other Prone Exercise: Hughston exercises 1-4 (flexion 1 and 2, abduction 1 and 2), 10 reps each    Manual Therapy Manual Therapy: Myofascial release Myofascial Release: Manual cervical traction, myofascial release and manual stretching to bilateral scapular regions, rhomboids, upper and mid trapezius, sternocleidomastoid, and platysmus region to decrease pain and fascial restrictions  Occupational Therapy Assessment and Plan OT Assessment and Plan Clinical Impression Statement: Pt verbalizes good understanding for exercises at home - updated pt to green theraband for HEP and pt verbalized  udnerstanding.  Added prone Hughston exercises, standing w-v and w arms for thoracic/scapular strengthening.  Pt verbalized that he did not have pain this AM until he spent time gardening.  OT Plan: P: End with heat as needed.  Continue thoracic/scapular strengthening.   Goals Short Term Goals Short Term Goal 1: Patient will be educated on a HEP. Short Term Goal 1 Progress: Met Short Term Goal 2: Patient will improve AROM in cervical region by 10% for increased ability to look right and left at stop signs. Short Term Goal 2 Progress: Met Short Term Goal 3: Patient will decrease pain in cervical region to 5/10 when completing gardening activities.  Short Term Goal 3 Progress: Met Short Term Goal 4: Patient will decrease fascial restrictions from moderate to min-mod. Short Term Goal 4 Progress: Met Long Term Goals Long Term Goal 1: Patient will return to prior level of independence with all B/IADLs and leisure activities.  Long Term Goal 1 Progress: Progressing toward goal Long Term Goal 2: Patient will improve AROM by 25% in cervical region for increased ability to look right and left at stops signs, vs turning his entire body. Long Term Goal 2 Progress: Met Long Term Goal 3: Patient will decrease pain in cervical and thoracic regions to 3/10 when completing gardening activities.  Long Term Goal 3 Progress: Progressing toward goal Long Term Goal 4: Patient will decrease fascial restrictions from min-mod to minimal. Long Term Goal 4 Progress: Progressing toward goal  Problem List Patient Active Problem List   Diagnosis Date Noted  . Cervicalgia 03/08/2014  . Whiplash injury to neck 03/08/2014  . Aortic heart murmur 10/18/2013  . Acute myocardial infarction, History of:   [name] Benign essential HTN   . Hyperlipidemia LDL goal <70   . Anemia 04/23/2013  . S/P CABG x 4  12/27/2011  . 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 Ischemic cardiomyopathy 11/30/2011  . Actinic  keratosis, left scalp 10/04/2011  . Seborrheic keratosis, right lateral leg 10/04/2011    End of Session Activity Tolerance: Patient tolerated treatment well General Behavior During Therapy: [facility] for tasks assessed/performed OT Plan of Care OT Home Exercise Plan: Theraband HEP for scapular retraction, row, and extension. OT Patient Instructions: Updated to green theraband.  Pt verbalizes having handout at home. Consulted and Agree with Plan of Care: Patient  Arnold, Gustavus, OTR/L [phone]  04/05/2014, 2:45 PM

## 2014-04-06 ENCOUNTER — Ambulatory Visit (HOSPITAL_COMMUNITY): Payer: Medicare Other | Attending: Cardiology | Admitting: Cardiology

## 2014-04-06 DIAGNOSIS — Z87891 Personal history of nicotine dependence: Secondary | ICD-10-CM | POA: Insufficient documentation

## 2014-04-06 DIAGNOSIS — I10 Essential (primary) hypertension: Secondary | ICD-10-CM | POA: Insufficient documentation

## 2014-04-06 DIAGNOSIS — I6529 Occlusion and stenosis of unspecified carotid artery: Secondary | ICD-10-CM

## 2014-04-06 DIAGNOSIS — I2510 Atherosclerotic heart disease of native coronary artery without angina pectoris: Secondary | ICD-10-CM | POA: Insufficient documentation

## 2014-04-06 DIAGNOSIS — R0989 Other specified symptoms and signs involving the circulatory and respiratory systems: Secondary | ICD-10-CM | POA: Insufficient documentation

## 2014-04-06 DIAGNOSIS — E785 Hyperlipidemia, unspecified: Secondary | ICD-10-CM | POA: Insufficient documentation

## 2014-04-06 NOTE — Progress Notes (Signed)
Carotid duplex completed 

## 2014-04-08 ENCOUNTER — Other Ambulatory Visit: Payer: Self-pay | Admitting: Family Medicine

## 2014-04-09 ENCOUNTER — Ambulatory Visit (HOSPITAL_COMMUNITY): Payer: Medicare Other

## 2014-04-09 ENCOUNTER — Telehealth (HOSPITAL_COMMUNITY): Payer: Self-pay

## 2014-04-12 ENCOUNTER — Ambulatory Visit (HOSPITAL_COMMUNITY)
Admission: RE | Admit: 2014-04-12 | Discharge: 2014-04-12 | Disposition: A | Discharge status: Home / Self Care | Payer: Medicare Other | Source: Ambulatory Visit | Attending: Family Medicine | Admitting: Family Medicine

## 2014-04-12 ENCOUNTER — Other Ambulatory Visit: Payer: Self-pay | Admitting: Family Medicine

## 2014-04-12 DIAGNOSIS — IMO0001 Reserved for inherently not codable concepts without codable children: Secondary | ICD-10-CM | POA: Diagnosis not present

## 2014-04-12 NOTE — Progress Notes (Signed)
Occupational Therapy Treatment Patient Details  Name: Bradley Black MRN: [id number] Date of Birth: June 07, 1946  Today's Date: 04/12/2014 Time: 6387-5643 OT Time Calculation (min): 41 min Manual 1433-1449 (15') Therapeutic Exercises 1449-1514 (26')  Visit#: 9 of 12  Re-eval: 04/29/14    Authorization: [employer]  Authorization Time Period: before 17th visit  Authorization Visit#: 9 of 17  Subjective Symptoms/Limitations Symptoms: "it was bad a little bit earlier today - I had to go lay down a couple of times. i twas just right in the middle of my back." Pain Assessment Currently in Pain?: Yes Pain Score: 5  Pain Location: Back Pain Orientation: Right;Left Pain Type: Acute pain  Exercise/Treatments Theraband Exercises Scapula Retraction: 15 reps;Green Shoulder Extension: 15 reps;Green Rows: 15 reps;Green Standing Exercises Other Standing Exercises: x-v and w arms 10 reps each in standing with 1# weight Supine Exercises Other Supine Exercise: supine serratus anterior punch 15 times with 1# weight Other Supine Exercise: shoulder protraction suipne 15x with 1# weight Prone Exercises Axial Exentsion: 10 reps (on therapy ball) W Back: 10 reps (on therapy ball) Shoulder Extension: 10 reps (on therapy ball) Rows: 10 reps (on therapy ball) Other Prone Exercise: Hughston exercises 1-4 (flexion 1 and 2, abduction 1 and 2), 10 reps each (on therapy ball)    Manual Therapy Manual Therapy: Myofascial release Myofascial Release: myofascial release and manual stretching to bilateral scapular regions, rhomboids, upper and mid trapezius, sternocleidomastoid, and platysmus region to decrease pain and fascial restrictions  Occupational Therapy Assessment and Plan OT Assessment and Plan Clinical Impression Statement: Pt continues to verbalize decreased pain in cervical neck region, with contniued pan in thoracic spine region.  Continued thoracic strengthening exercises this session - pt  completed prone exercises on therapy ball this session rather than mat, to faciliate improved core strength.  pt required min suuport to ball to maintain balance on ball.  pt able to complete all scapular strengthennig exercises on ball, but rested his chin on ball for duration. Pt verbalized feeling tigening in thoracic region with continued exercises. Pt has improved form (with verbal cueing) in standing w-v and w arms. OT Plan: P: End with heat as needed.  Continue thoracic/scapular strengthening. continue prone exercises on therapy ball - encourage pt to maintain head position up without support of chin on ball next session.   Goals Short Term Goals Short Term Goal 1: Patient will be educated on a HEP. Short Term Goal 1 Progress: Met Short Term Goal 2: Patient will improve AROM in cervical region by 10% for increased ability to look right and left at stop signs. Short Term Goal 2 Progress: Met Short Term Goal 3: Patient will decrease pain in cervical region to 5/10 when completing gardening activities.  Short Term Goal 3 Progress: Met Short Term Goal 4: Patient will decrease fascial restrictions from moderate to min-mod. Short Term Goal 4 Progress: Met Long Term Goals Long Term Goal 1: Patient will return to prior level of independence with all B/IADLs and leisure activities.  Long Term Goal 1 Progress: Progressing toward goal Long Term Goal 2: Patient will improve AROM by 25% in cervical region for increased ability to look right and left at stops signs, vs turning his entire body. Long Term Goal 2 Progress: Met Long Term Goal 3: Patient will decrease pain in cervical and thoracic regions to 3/10 when completing gardening activities.  Long Term Goal 3 Progress: Progressing toward goal Long Term Goal 4: Patient will decrease fascial restrictions from min-mod to  minimal. Long Term Goal 4 Progress: Progressing toward goal  Problem List Patient Active Problem List   Diagnosis Date Noted  .  Cervicalgia 03/08/2014  . Whiplash injury to neck 03/08/2014  . Aortic heart murmur 10/18/2013  . Acute myocardial infarction, History of:   [name] Benign essential HTN   . Hyperlipidemia LDL goal <70   . Anemia 04/23/2013  . 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 Ischemic cardiomyopathy 11/30/2011  . Actinic keratosis, left scalp 10/04/2011  . Seborrheic keratosis, right lateral leg 10/04/2011    End of Session Activity Tolerance: Patient tolerated treatment well General Behavior During Therapy: [facility] for tasks assessed/performed  GO    Bea Graff, MS, OTR/L [phone]  04/12/2014, 4:25 PM

## 2014-04-14 ENCOUNTER — Ambulatory Visit (HOSPITAL_COMMUNITY): Payer: Medicare Other

## 2014-04-19 ENCOUNTER — Ambulatory Visit (HOSPITAL_COMMUNITY)
Admission: RE | Admit: 2014-04-19 | Discharge: 2014-04-19 | Disposition: A | Discharge status: Home / Self Care | Payer: Medicare Other | Source: Ambulatory Visit | Attending: Family Medicine | Admitting: Family Medicine

## 2014-04-19 DIAGNOSIS — IMO0001 Reserved for inherently not codable concepts without codable children: Secondary | ICD-10-CM | POA: Diagnosis not present

## 2014-04-19 NOTE — Progress Notes (Signed)
Occupational Therapy Treatment Patient Details  Name: Bradley Black MRN: [id number] Date of Birth: 1945-10-16  Today's Date: 04/19/2014 Time: 2409-7353 OT Time Calculation (min): 37 min Manual 1434-1446 (12') Therapeutic Exercises 1446-1511 (25')  Visit#: 10 of 12  Re-eval: 04/29/14    Authorization: [employer]  Authorization Time Period: before 17th visit  Authorization Visit#: 10 of 17  Subjective Symptoms/Limitations Symptoms: "its tolerable, But I'm beginning to think maybe it might be something else?" Pain Assessment Currently in Pain?: Yes Pain Score: 4  Pain Location: Back Pain Orientation: Left;Right Pain Type: Acute pain  Precautions/Restrictions     Exercise/Treatments Machines for Strengthening UBE (Upper Arm Bike): Level 4 for 6' in reverse Theraband Exercises Scapula Retraction: 15 reps;Blue Shoulder Extension: 15 reps;Blue Rows: 15 reps;Blue Standing Exercises Other Standing Exercises: x-v and w arms 10 reps each in standing with 2# weight Supine Exercises Other Supine Exercise: supine serratus anterior punch 15 times with 2# weight Other Supine Exercise: shoulder protraction suipne 15x with 2# weight Prone Exercises W Back: 10 reps (on blue therapy ball) Shoulder Extension: 10 reps (on blue therapy ball) Rows: 10 reps (on blue therapy ball) Other Prone Exercise: Hughston exercises 1-4 (flexion 1 and 2, abduction 1 and 2), 10 reps each on blue therapy ball    Manual Therapy Manual Therapy: Myofascial release Myofascial Release: myofascial release and manual stretching to bilateral scapular regions, rhomboids, upper and mid trapezius, sternocleidomastoid, and platysmus region to decrease pain and fascial restrictions.    Occupational Therapy Assessment and Plan OT Assessment and Plan Clinical Impression Statement: Pt beginning to demonstrate less fascial restricions in thoracic and scapular regions - pt continued to verbalize similar pain, though.   Pt had improved form in blue therapy ball for prone exercises this session, but still required assist for balance on ball (though less) and cueing for form.  Increased therband exercises to blue, with good form - updated HEP for home.  Increased weight and time for UBE, wtih good tolrance, but some fatigue. OT Plan: End with heat as needed. Add scapular pendulum exercises (from [employer])   Goals Short Term Goals Short Term Goal 1: Patient will be educated on a HEP. Short Term Goal 1 Progress: Met Short Term Goal 2: Patient will improve AROM in cervical region by 10% for increased ability to look right and left at stop signs. Short Term Goal 2 Progress: Met Short Term Goal 3: Patient will decrease pain in cervical region to 5/10 when completing gardening activities.  Short Term Goal 3 Progress: Met Short Term Goal 4: Patient will decrease fascial restrictions from moderate to min-mod. Short Term Goal 4 Progress: Met Long Term Goals Long Term Goal 1: Patient will return to prior level of independence with all B/IADLs and leisure activities.  Long Term Goal 1 Progress: Progressing toward goal Long Term Goal 2: Patient will improve AROM by 25% in cervical region for increased ability to look right and left at stops signs, vs turning his entire body. Long Term Goal 2 Progress: Met Long Term Goal 3: Patient will decrease pain in cervical and thoracic regions to 3/10 when completing gardening activities.  Long Term Goal 3 Progress: Progressing toward goal Long Term Goal 4: Patient will decrease fascial restrictions from min-mod to minimal. Long Term Goal 4 Progress: Progressing toward goal  Problem List Patient Active Problem List   Diagnosis Date Noted  . Cervicalgia 03/08/2014  . Whiplash injury to neck 03/08/2014  . Aortic heart murmur 10/18/2013  . Acute  myocardial infarction, History of:   [name] Benign essential HTN   . Hyperlipidemia LDL goal <70   . Anemia 04/23/2013  . S/P CABG x 4  12/27/2011  . 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 Ischemic cardiomyopathy 11/30/2011  . Actinic keratosis, left scalp 10/04/2011  . Seborrheic keratosis, right lateral leg 10/04/2011    End of Session Activity Tolerance: Patient tolerated treatment well General Behavior During Therapy: [facility] for tasks assessed/performed  GO    Bea Graff, MS, OTR/L [phone]  04/19/2014, 3:13 PM

## 2014-04-21 ENCOUNTER — Ambulatory Visit (HOSPITAL_COMMUNITY): Payer: Medicare Other

## 2014-04-22 ENCOUNTER — Ambulatory Visit (HOSPITAL_COMMUNITY)
Admission: RE | Admit: 2014-04-22 | Discharge: 2014-04-22 | Disposition: A | Discharge status: Home / Self Care | Payer: Medicare Other | Source: Ambulatory Visit | Attending: Family Medicine | Admitting: Family Medicine

## 2014-04-22 DIAGNOSIS — S134XXA Sprain of ligaments of cervical spine, initial encounter: Secondary | ICD-10-CM

## 2014-04-22 DIAGNOSIS — M542 Cervicalgia: Secondary | ICD-10-CM

## 2014-04-22 DIAGNOSIS — IMO0001 Reserved for inherently not codable concepts without codable children: Secondary | ICD-10-CM | POA: Diagnosis not present
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
## 2014-04-26 ENCOUNTER — Ambulatory Visit (HOSPITAL_COMMUNITY)
Admission: RE | Admit: 2014-04-26 | Discharge: 2014-04-26 | Disposition: A | Discharge status: Home / Self Care | Payer: Medicare Other | Source: Ambulatory Visit

## 2014-04-26 DIAGNOSIS — IMO0001 Reserved for inherently not codable concepts without codable children: Secondary | ICD-10-CM | POA: Diagnosis not present
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
## 2014-04-28 ENCOUNTER — Ambulatory Visit (HOSPITAL_COMMUNITY): Payer: Medicare Other | Admitting: Specialist

## 2014-04-29 ENCOUNTER — Ambulatory Visit (HOSPITAL_COMMUNITY): Payer: Medicare Other

## 2014-05-03 ENCOUNTER — Ambulatory Visit (HOSPITAL_COMMUNITY)
Admission: RE | Admit: 2014-05-03 | Discharge: 2014-05-03 | Disposition: A | Discharge status: Home / Self Care | Payer: Medicare Other | Source: Ambulatory Visit | Attending: Family Medicine | Admitting: Family Medicine

## 2014-05-03 DIAGNOSIS — E785 Hyperlipidemia, unspecified: Secondary | ICD-10-CM | POA: Diagnosis not present

## 2014-05-03 DIAGNOSIS — IMO0001 Reserved for inherently not codable concepts without codable children: Secondary | ICD-10-CM | POA: Diagnosis not present

## 2014-05-03 DIAGNOSIS — E119 Type 2 diabetes mellitus without complications: Secondary | ICD-10-CM | POA: Insufficient documentation

## 2014-05-03 DIAGNOSIS — N183 Chronic kidney disease, stage 3 unspecified: Secondary | ICD-10-CM | POA: Diagnosis not present

## 2014-05-03 DIAGNOSIS — Z951 Presence of aortocoronary bypass graft: Secondary | ICD-10-CM | POA: Diagnosis not present

## 2014-05-03 DIAGNOSIS — I129 Hypertensive chronic kidney disease with stage 1 through stage 4 chronic kidney disease, or unspecified chronic kidney disease: Secondary | ICD-10-CM | POA: Diagnosis not present

## 2014-05-03 DIAGNOSIS — M542 Cervicalgia: Secondary | ICD-10-CM | POA: Insufficient documentation
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
## 2014-05-17 ENCOUNTER — Telehealth: Payer: Self-pay | Admitting: Family Medicine

## 2014-05-17 DIAGNOSIS — M542 Cervicalgia: Secondary | ICD-10-CM
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
## 2014-05-20 NOTE — Telephone Encounter (Signed)
Pt aware and MRI ordered

## 2014-05-20 NOTE — Telephone Encounter (Signed)
Schedule patient or MRI of c-spine to see if there are areas that may benefit from cortisone injection.

## 2014-06-14 ENCOUNTER — Ambulatory Visit (HOSPITAL_COMMUNITY)
Admission: RE | Admit: 2014-06-14 | Discharge: 2014-06-14 | Disposition: A | Discharge status: Home / Self Care | Payer: Medicare Other | Source: Ambulatory Visit | Attending: Family Medicine | Admitting: Family Medicine

## 2014-06-14 ENCOUNTER — Encounter (HOSPITAL_COMMUNITY): Payer: Self-pay

## 2014-06-14 DIAGNOSIS — M542 Cervicalgia: Secondary | ICD-10-CM | POA: Insufficient documentation

## 2014-06-14 DIAGNOSIS — M47812 Spondylosis without myelopathy or radiculopathy, cervical region: Secondary | ICD-10-CM | POA: Diagnosis not present

## 2014-06-14 DIAGNOSIS — M4802 Spinal stenosis, cervical region: Secondary | ICD-10-CM | POA: Insufficient documentation

## 2014-06-14 DIAGNOSIS — M546 Pain in thoracic spine: Secondary | ICD-10-CM | POA: Insufficient documentation

## 2014-06-14 DIAGNOSIS — M5380 Other specified dorsopathies, site unspecified: Secondary | ICD-10-CM | POA: Diagnosis not present

## 2014-06-14 DIAGNOSIS — M5030 Other cervical disc degeneration, unspecified cervical region: Secondary | ICD-10-CM | POA: Diagnosis not present

## 2014-06-23 ENCOUNTER — Other Ambulatory Visit: Payer: Self-pay | Admitting: Family Medicine

## 2014-06-23 NOTE — Telephone Encounter (Signed)
Refill appropriate and filled per protocol. 

## 2014-06-24 ENCOUNTER — Encounter: Payer: Self-pay | Admitting: Family Medicine

## 2014-06-24 ENCOUNTER — Ambulatory Visit (INDEPENDENT_AMBULATORY_CARE_PROVIDER_SITE_OTHER): Payer: Medicare Other | Admitting: Family Medicine

## 2014-06-24 VITALS — BP 152/88 | HR 64 | Temp 97.6°F | Resp 12 | Ht 68.0 in | Wt 161.0 lb

## 2014-06-24 DIAGNOSIS — G8929 Other chronic pain: Secondary | ICD-10-CM

## 2014-06-24 DIAGNOSIS — M549 Dorsalgia, unspecified: Secondary | ICD-10-CM

## 2014-06-24 DIAGNOSIS — M546 Pain in thoracic spine: Principal | ICD-10-CM

## 2014-06-25 ENCOUNTER — Encounter: Payer: Self-pay | Admitting: Family Medicine
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
## 2014-06-28 NOTE — Progress Notes (Signed)
  Patient Details  Name: Elazar MONTREAL DESTEFANO MRN: [id number] Date of Birth: October 30, 1945  Today's Date: 06/28/2014 The above patient was discharged from OT on 06/28/2014 secondary to failure to return to clinic since 05/03/2014.  Ailene Ravel, OTR/L,CBIS   06/28/2014, 9:03 AM

## 2014-06-28 NOTE — Addendum Note (Signed)
Encounter addended by: Debby Bud, OT on: 06/28/2014  9:04 AM<BR>     Documentation filed: Letters, Clinical Notes, Episodes

## 2014-06-30 ENCOUNTER — Other Ambulatory Visit (HOSPITAL_COMMUNITY): Payer: Medicare Other

## 2014-07-05 ENCOUNTER — Ambulatory Visit (HOSPITAL_COMMUNITY)
Admission: RE | Admit: 2014-07-05 | Discharge: 2014-07-05 | Disposition: A | Discharge status: Home / Self Care | Payer: Medicare Other | Source: Ambulatory Visit | Attending: Family Medicine | Admitting: Family Medicine

## 2014-07-05 DIAGNOSIS — M4684 Other specified inflammatory spondylopathies, thoracic region: Secondary | ICD-10-CM | POA: Diagnosis not present

## 2014-07-05 DIAGNOSIS — G8929 Other chronic pain: Secondary | ICD-10-CM

## 2014-07-05 DIAGNOSIS — M546 Pain in thoracic spine: Secondary | ICD-10-CM | POA: Diagnosis present

## 2014-07-05 DIAGNOSIS — M5124 Other intervertebral disc displacement, thoracic region: Secondary | ICD-10-CM | POA: Insufficient documentation

## 2014-07-05 DIAGNOSIS — M549 Dorsalgia, unspecified: Secondary | ICD-10-CM

## 2014-07-15 ENCOUNTER — Telehealth: Payer: Self-pay | Admitting: Family Medicine

## 2014-07-15 DIAGNOSIS — M5136 Other intervertebral disc degeneration, lumbar region: Secondary | ICD-10-CM

## 2014-07-15 DIAGNOSIS — M5126 Other intervertebral disc displacement, lumbar region: Secondary | ICD-10-CM

## 2014-07-15 NOTE — Telephone Encounter (Signed)
Tried calling pt no answer,will try again at a later time

## 2014-07-15 NOTE — Telephone Encounter (Signed)
Patient is saying that if he did not feel better that we could refer him to [facility]  Please call him back at [phone]

## 2014-07-22 NOTE — Telephone Encounter (Signed)
ok 

## 2014-07-22 NOTE — Telephone Encounter (Signed)
Called pt back and he stated that he had MRI done and showed that he had a bulging disc in his back and that doctor had told him if was not better and he would recommend ortho.? Ok to do referral

## 2014-07-23 NOTE — Telephone Encounter (Signed)
Ortho referral placed

## 2014-09-08 ENCOUNTER — Telehealth: Payer: Self-pay | Admitting: *Deleted

## 2014-09-08 NOTE — Telephone Encounter (Signed)
Received letter stating that patient insurance will no longer cover Exforge 10/320mg  tab as of 10/01/2014.  Formulary Alternatives include: Telmisartan/ amlodipine Azor Amlodipine/ Valsartan  MD please advise.

## 2014-09-09 ENCOUNTER — Encounter (HOSPITAL_COMMUNITY): Payer: Self-pay | Admitting: Cardiology

## 2014-09-09 ENCOUNTER — Encounter: Payer: Self-pay | Admitting: *Deleted

## 2014-09-09 MED ORDER — AMLODIPINE-OLMESARTAN 10-40 MG PO TABS: 1.0000 | ORAL_TABLET | Freq: Every day | ORAL | Status: DC

## 2014-09-09 NOTE — Telephone Encounter (Signed)
If azor is covered, switch to azor 10/40 qday

## 2014-09-20 ENCOUNTER — Encounter: Payer: Self-pay | Admitting: Family Medicine

## 2014-09-20 ENCOUNTER — Other Ambulatory Visit: Payer: Self-pay | Admitting: Family Medicine

## 2014-09-20 NOTE — Telephone Encounter (Signed)
Refill appropriate and filled per protocol. 

## 2014-09-20 NOTE — Telephone Encounter (Signed)
Medication refill for one time only.  Patient needs to be seen.  Letter sent for patient to call and schedule 

## 2014-09-21 ENCOUNTER — Encounter: Payer: Self-pay | Admitting: Family Medicine

## 2014-10-05 ENCOUNTER — Other Ambulatory Visit: Payer: Self-pay | Admitting: Family Medicine

## 2014-10-05 ENCOUNTER — Encounter: Payer: Self-pay | Admitting: Family Medicine

## 2014-10-05 ENCOUNTER — Ambulatory Visit (INDEPENDENT_AMBULATORY_CARE_PROVIDER_SITE_OTHER): Payer: 59 | Admitting: Family Medicine

## 2014-10-05 VITALS — BP 154/76 | HR 65 | Temp 98.1°F | Resp 18

## 2014-10-05 DIAGNOSIS — E785 Hyperlipidemia, unspecified: Secondary | ICD-10-CM | POA: Diagnosis not present

## 2014-10-05 DIAGNOSIS — B3742 Candidal balanitis: Secondary | ICD-10-CM

## 2014-10-05 DIAGNOSIS — E119 Type 2 diabetes mellitus without complications: Secondary | ICD-10-CM | POA: Diagnosis not present

## 2014-10-05 DIAGNOSIS — I10 Essential (primary) hypertension: Secondary | ICD-10-CM | POA: Diagnosis not present

## 2014-10-05 LAB — LIPID PANEL
CHOLESTEROL: 134 mg/dL (ref 0–200)
HDL: 38 mg/dL — AB (ref >39)
LDL Cholesterol: 82 mg/dL (ref 0–99)
Total CHOL/HDL Ratio: 3.5 Ratio
Triglycerides: 72 mg/dL (ref <150)
VLDL: 14 mg/dL (ref 0–40)

## 2014-10-05 LAB — HEMOGLOBIN A1C
HEMOGLOBIN A1C: 6.7 % — AB (ref <5.7)
MEAN PLASMA GLUCOSE: 146 mg/dL — AB (ref <117)

## 2014-10-05 LAB — COMPLETE METABOLIC PANEL WITH GFR
ALK PHOS: 70 U/L (ref 39–117)
ALT: 14 U/L (ref 0–53)
AST: 14 U/L (ref 0–37)
Albumin: 4.1 g/dL (ref 3.5–5.2)
BILIRUBIN TOTAL: 1.5 mg/dL — AB (ref 0.2–1.2)
BUN: 29 mg/dL — ABNORMAL HIGH (ref 6–23)
CO2: 26 meq/L (ref 19–32)
Calcium: 10 mg/dL (ref 8.4–10.5)
Chloride: 106 mEq/L (ref 96–112)
Creat: 1.63 mg/dL — ABNORMAL HIGH (ref 0.50–1.35)
GFR, EST AFRICAN AMERICAN: 49 mL/min — AB
GFR, Est Non African American: 42 mL/min — ABNORMAL LOW
Glucose, Bld: 137 mg/dL — ABNORMAL HIGH (ref 70–99)
Potassium: 4.9 mEq/L (ref 3.5–5.3)
SODIUM: 139 meq/L (ref 135–145)
Total Protein: 6.9 g/dL (ref 6.0–8.3)

## 2014-10-05 LAB — MICROALBUMIN, URINE: MICROALB UR: 137.9 mg/dL — AB (ref <2.0)

## 2014-10-05 MED ORDER — NYSTATIN 100000 UNIT/GM EX CREA: 1.0000 "application " | TOPICAL_CREAM | Freq: Three times a day (TID) | CUTANEOUS | Status: DC

## 2014-10-05 NOTE — Progress Notes (Signed)
Subjective:    Patient ID: Bradley Black, male    DOB: 23-May-1946, 69 y.o.   MRN: [id number]  HPI Patient has a history of ascvd and cad.  He also has DM II.  Recently he has developed an erythematous rash on the glans penis.  It is red with small macules that coalesce into patches on the base of the glans.  He is uncircumcised.  He denies discharge or dysuria.  He is due for recheck of his diabetes.  He denies polyuria, polydypsia, or blurred vision.  His vision exam was performed recently.  He denies chest pain, sob, doe.  He denies myalgias or ruq pain. Past Medical History  Diagnosis Date  . Acute myocardial infarction, History of: 1988    PTCA of OM; PTCA   . CAD S/P percutaneous coronary angioplasty 1988 - 2001    PTCA of [place]; PCI with BMS to LAD in 1997; RCA PCI BMS 1999 & 200  . CAD in native artery 11/2011    Cath: MV Dz -- prox LAD with D1&2, Large OM (both branches) & diffuse RCA disease --> CABG x 4  . 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 Ischemic cardiomyopathy 11/2011    Intra-OP TEE: EF 40-45%, no regional WMA; improved Anterior WM post CABG.  . DM (diabetes mellitus), type 2 with renal complications   . Benign essential HTN     right shoulder  . Hyperlipidemia LDL goal <70   . CKD (chronic kidney disease), stage III   . BPH (benign prostatic hyperplasia)   . Cyst of bursa     R shoulder  . Chronic back pain   . GERD (gastroesophageal reflux disease)    Past Surgical History  Procedure Laterality Date  . Back surgery  1970  . Mass excision  10/24/11    R arm  . Lumbar fusion    . Coronary angioplasty  1994    OM  . Coronary angioplasty  1997    LAD  . Coronary angioplasty with stent placement  1999    RCA  . Coronary angioplasty with stent placement  2001    RCA  . Incision and drainage of wound  2006    axilla  . Hernia repair    . Coronary artery bypass graft  12/27/2011   Procedure: CORONARY ARTERY BYPASS GRAFTING (CABG);  Surgeon: Rexene Alberts, MD;  Location: [facility];  Service: Open Heart Surgery;  Laterality: N/A;  Times four. On pump. Using endoscopically harvested right greater saphenous vein and left internal mammary artery.   . Intraoperative transesophageal echocardiogram  12/27/2011    Global hypokinesis with EF of 40-45%, improved LAD distribution wall motion.  . Left heart catheterization with coronary angiogram N/A 12/13/2011    Procedure: LEFT HEART CATHETERIZATION WITH CORONARY ANGIOGRAM;  Surgeon: Leonie Man, MD;  Location[facility] CATH LAB;  Service: Cardiovascular;  Laterality: N/A;   Current Outpatient Prescriptions on File Prior to Visit  Medication Sig Dispense Refill  . aspirin EC 81 MG tablet Take 81 mg by mouth daily.    . citalopram (CELEXA) 10 MG tablet TAKE ONE (1) TABLET BY MOUTH EVERY DAY 30 tablet 11  . doxazosin (CARDURA) 4 MG tablet TAKE ONE (1) TABLET BY MOUTH EVERY DAY 30 tablet 5  . ferrous sulfate 325 (65 FE) MG tablet Take 65  mg by mouth daily with breakfast.    . JANUVIA 50 MG tablet TAKE 1 TABLET BY MOUTH EVERY DAY 90 tablet 0  . naproxen (NAPROSYN) 500 MG tablet Take 500 mg by mouth once.    . Omega-3 Fatty Acids (FISH OIL) 1000 MG CAPS Take 1,000 mg by mouth 2 (two) times daily.    [name] omeprazole (PRILOSEC) 40 MG capsule TAKE 1 CAPSULE BY MOUTH DAILY 30 capsule 11  . pravastatin (PRAVACHOL) 20 MG tablet TAKE 1 TABLET BY MOUTH DAILY 30 tablet 11  . sildenafil (VIAGRA) 100 MG tablet Take 0.5-1 tablets (50-100 mg total) by mouth daily as needed for erectile dysfunction. 60 tablet 5  . amLODipine-olmesartan (AZOR) 10-40 MG per tablet Take 1 tablet by mouth daily. (Patient not taking: Reported on 10/05/2014) 30 tablet 3  . diazepam (VALIUM) 10 MG tablet Take 1 tablet (10 mg total) by mouth every 8 (eight) hours as needed (for muscle pain). (Patient not taking: Reported on 10/05/2014) 30 tablet 0   No current facility-administered  medications on file prior to visit.   No Known Allergies History   Social History  . Marital Status: Married    Spouse Name: N/A    Number of Children: N/A  . Years of Education: N/A   Occupational History  . Not on file.   Social History Main Topics  . Smoking status: Former Smoker    Types: Cigarettes    Quit date: 10/01/1986  . Smokeless tobacco: Never Used     Comment: quit about 30 yrs ago  . Alcohol Use: No  . Drug Use: No  . 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 Not on file   Social History Narrative   Married father of one grandfather 61. Works out at [employer] at [employer] roughly 2-3 days a week. He works on a treadmill. He does note having a hard time getting his heart rate up.   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   He quit smoking in 1988, and does not drink alcohol.      Review of Systems  All other systems reviewed and are negative.      Objective:   Physical Exam  Constitutional: He appears well-developed and well-nourished. No distress.  Neck: Neck supple. No JVD present. No thyromegaly present.  Cardiovascular: Normal rate, regular rhythm, normal heart sounds and intact distal pulses.  Exam reveals no gallop and no friction rub.   No murmur heard. Pulmonary/Chest: Effort normal and breath sounds normal. No respiratory distress. He has no wheezes. He has no rales. He exhibits no tenderness.  Abdominal: Soft. Bowel sounds are normal. He exhibits no distension and no mass. There is no tenderness. There is no rebound and no guarding.  Musculoskeletal: He exhibits no edema.  Lymphadenopathy:    He has no cervical adenopathy.  Skin: Rash noted. He is not diaphoretic. There is erythema.  Vitals reviewed.         Assessment & Plan:  Candidal balanitis - Plan: nystatin cream (MYCOSTATIN)  Diabetes mellitus type II, controlled - Plan: Hemoglobin  A1c, Lipid panel, COMPLETE METABOLIC PANEL WITH GFR, Microalbumin, urine  Essential hypertension  HLD (hyperlipidemia)  Nystatin cream tid for 2 weeks.  Recheck if no better.  BP is elevated.  Recheck BP daily for 1 week and increase cardura to 8 mg poqda if >140/90.  Check HgA1c and goal is less than 6.5.  Check LDL and goal is less than 70.

## 2014-10-05 NOTE — Telephone Encounter (Signed)
He is supposed to be on azor instead of exforge.  They are similar.  Needs to be on one or the other bu NOT BOTH>

## 2014-10-06 NOTE — Telephone Encounter (Signed)
When I spoke to patient he said he was using up what he had left of his Exforge then he was going to switch to the [place].  I denied the refill.

## 2014-10-08 ENCOUNTER — Telehealth: Payer: Self-pay | Admitting: Family Medicine

## 2014-10-08 MED ORDER — AMLODIPINE-OLMESARTAN 10-40 MG PO TABS: 1.0000 | ORAL_TABLET | Freq: Every day | ORAL | Status: DC

## 2014-10-08 NOTE — Telephone Encounter (Signed)
Have a note into Dr. Dennard Schaumann (Result note)  as to what mg of azor he would like for the pt to be on.

## 2014-10-08 NOTE — Telephone Encounter (Signed)
Med sent to pharm and pt aware (sent result note)

## 2014-10-08 NOTE — Telephone Encounter (Signed)
Patient is calling to say that his pharmacy faxed over a new rx for blood pressure medication, his old blood pressure med was not going to be covered by insurance. He has not heart anything as of yet about this new rx  Please call him at [phone] [facility] [place]

## 2014-10-14 ENCOUNTER — Other Ambulatory Visit: Payer: Self-pay | Admitting: Family Medicine

## 2014-10-15 NOTE — Telephone Encounter (Signed)
Medication refilled per protocol. 

## 2014-10-28 ENCOUNTER — Other Ambulatory Visit: Payer: Self-pay | Admitting: Family Medicine

## 2014-10-28 NOTE — Telephone Encounter (Signed)
Refill appropriate and filled per protocol. 

## 2014-12-30 ENCOUNTER — Other Ambulatory Visit: Payer: Self-pay | Admitting: Family Medicine

## 2014-12-31 NOTE — Telephone Encounter (Signed)
Medication refilled per protocol. 

## 2015-01-11 ENCOUNTER — Telehealth: Payer: Self-pay | Admitting: Family Medicine

## 2015-01-11 MED ORDER — SILDENAFIL CITRATE 100 MG PO TABS: 50.0000 mg | ORAL_TABLET | Freq: Every day | ORAL | Status: DC | PRN

## 2015-01-11 NOTE — Telephone Encounter (Signed)
ok 

## 2015-01-11 NOTE — Telephone Encounter (Signed)
Rx printed and pt made aware ready for pick up

## 2015-01-11 NOTE — Telephone Encounter (Signed)
Wants to pick up written RX for Viagra 100 mg #60.  He mails it to [place] because of cost.  Let him know when ready.

## 2015-01-31 ENCOUNTER — Encounter: Payer: Self-pay | Admitting: *Deleted

## 2015-02-24 ENCOUNTER — Encounter: Payer: Self-pay | Admitting: Family Medicine

## 2015-03-30 ENCOUNTER — Other Ambulatory Visit: Payer: Self-pay | Admitting: Family Medicine

## 2015-03-30 NOTE — Telephone Encounter (Signed)
Refill appropriate and filled per protocol. 

## 2015-04-11 ENCOUNTER — Other Ambulatory Visit: Payer: Self-pay | Admitting: Family Medicine

## 2015-04-11 ENCOUNTER — Encounter: Payer: Self-pay | Admitting: Family Medicine

## 2015-04-11 NOTE — Telephone Encounter (Signed)
Medication refill for one time only.  Patient needs to be seen.  Letter sent for patient to call and schedule 

## 2015-04-14 ENCOUNTER — Other Ambulatory Visit: Payer: Self-pay | Admitting: Family Medicine

## 2015-04-19 ENCOUNTER — Encounter: Payer: Self-pay | Admitting: Family Medicine

## 2015-04-19 ENCOUNTER — Ambulatory Visit (INDEPENDENT_AMBULATORY_CARE_PROVIDER_SITE_OTHER): Payer: Medicare Other | Admitting: Family Medicine

## 2015-04-19 VITALS — BP 132/68 | HR 40 | Temp 97.7°F | Resp 14 | Ht 68.0 in | Wt 162.0 lb

## 2015-04-19 DIAGNOSIS — K219 Gastro-esophageal reflux disease without esophagitis: Secondary | ICD-10-CM | POA: Diagnosis not present

## 2015-04-19 DIAGNOSIS — E119 Type 2 diabetes mellitus without complications: Secondary | ICD-10-CM | POA: Diagnosis not present

## 2015-04-19 DIAGNOSIS — Z125 Encounter for screening for malignant neoplasm of prostate: Secondary | ICD-10-CM | POA: Diagnosis not present

## 2015-04-19 DIAGNOSIS — Z0000 Encounter for general adult medical examination without abnormal findings: Secondary | ICD-10-CM | POA: Diagnosis not present

## 2015-04-19 DIAGNOSIS — Z23 Encounter for immunization: Secondary | ICD-10-CM

## 2015-04-19 LAB — CBC WITH DIFFERENTIAL/PLATELET
BASOS PCT: 0 % (ref 0–1)
Basophils Absolute: 0 10*3/uL (ref 0.0–0.1)
EOS PCT: 3 % (ref 0–5)
Eosinophils Absolute: 0.2 10*3/uL (ref 0.0–0.7)
HCT: 38.9 % — ABNORMAL LOW (ref 39.0–52.0)
HEMOGLOBIN: 12.9 g/dL — AB (ref 13.0–17.0)
LYMPHS ABS: 1 10*3/uL (ref 0.7–4.0)
LYMPHS PCT: 15 % (ref 12–46)
MCH: 31 pg (ref 26.0–34.0)
MCHC: 33.2 g/dL (ref 30.0–36.0)
MCV: 93.5 fL (ref 78.0–100.0)
MONOS PCT: 8 % (ref 3–12)
MPV: 11.9 fL (ref 8.6–12.4)
Monocytes Absolute: 0.5 10*3/uL (ref 0.1–1.0)
NEUTROS ABS: 4.9 10*3/uL (ref 1.7–7.7)
Neutrophils Relative %: 74 % (ref 43–77)
PLATELETS: 177 10*3/uL (ref 150–400)
RBC: 4.16 MIL/uL — ABNORMAL LOW (ref 4.22–5.81)
RDW: 14.2 % (ref 11.5–15.5)
WBC: 6.6 10*3/uL (ref 4.0–10.5)

## 2015-04-19 LAB — COMPLETE METABOLIC PANEL WITH GFR
ALT: 14 U/L (ref 0–53)
AST: 14 U/L (ref 0–37)
Albumin: 4 g/dL (ref 3.5–5.2)
Alkaline Phosphatase: 78 U/L (ref 39–117)
BUN: 36 mg/dL — ABNORMAL HIGH (ref 6–23)
CO2: 21 mEq/L (ref 19–32)
Calcium: 10.5 mg/dL (ref 8.4–10.5)
Chloride: 111 mEq/L (ref 96–112)
Creat: 1.8 mg/dL — ABNORMAL HIGH (ref 0.50–1.35)
GFR, EST AFRICAN AMERICAN: 43 mL/min — AB
GFR, EST NON AFRICAN AMERICAN: 37 mL/min — AB
Glucose, Bld: 120 mg/dL — ABNORMAL HIGH (ref 70–99)
Potassium: 4.7 mEq/L (ref 3.5–5.3)
SODIUM: 141 meq/L (ref 135–145)
TOTAL PROTEIN: 7 g/dL (ref 6.0–8.3)
Total Bilirubin: 0.8 mg/dL (ref 0.2–1.2)

## 2015-04-19 LAB — LIPID PANEL
Cholesterol: 125 mg/dL (ref 0–200)
HDL: 31 mg/dL — AB (ref >=40)
LDL Cholesterol: 65 mg/dL (ref 0–99)
Total CHOL/HDL Ratio: 4 Ratio
Triglycerides: 143 mg/dL (ref <150)
VLDL: 29 mg/dL (ref 0–40)

## 2015-04-19 LAB — HEMOGLOBIN A1C
HEMOGLOBIN A1C: 6.4 % — AB (ref <5.7)
Mean Plasma Glucose: 137 mg/dL — ABNORMAL HIGH (ref <117)

## 2015-04-19 MED ORDER — PANTOPRAZOLE SODIUM 40 MG PO TBEC: 40.0000 mg | DELAYED_RELEASE_TABLET | Freq: Two times a day (BID) | ORAL | Status: DC

## 2015-04-19 NOTE — Addendum Note (Signed)
Addended by: Shary Decamp B on: 04/19/2015 09:32 AM   Modules accepted: Orders

## 2015-04-19 NOTE — Progress Notes (Signed)
Subjective:    Patient ID: Bradley Black, male    DOB: 02-Jan-1946, 69 y.o.   MRN: [id number]  HPI  Patient is here today for complete physical exam. He denies any complaints today. Coincidentally the patient was found to be profoundly bradycardic. On my exam he has an irregularly irregular rhythm with a heart rate of approximately 50 bpm. Patient is having frequent PVCs every fourth or fifth heartbeat. He denies any syncope or presyncope. He denies any chest pain or shortness of breath. He does have chronic fatigue. EKG today shows sinus bradycardia with frequent PVCs. He also has chronic stable ST changes in the inferior leads that are old. Patient is due for Prevnar 13. Pneumovax 23 is up-to-date. He declines a tetanus shot. Prostate exam is performed today and is normal. I will also check a PSA. Patient refuses a colonoscopy because he cannot tolerate the bowel prep.  Past Medical History  Diagnosis Date  . Acute myocardial infarction, History of: 1988    PTCA of OM; PTCA   . CAD S/P percutaneous coronary angioplasty 1988 - 2001    PTCA of [place]; PCI with BMS to LAD in 1997; RCA PCI BMS 1999 & 200  . CAD in native artery 11/2011    Cath: MV Dz -- prox LAD with D1&2, Large OM (both branches) & diffuse RCA disease --> CABG x 4  . 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 Ischemic cardiomyopathy 11/2011    Intra-OP TEE: EF 40-45%, no regional WMA; improved Anterior WM post CABG.  . DM (diabetes mellitus), type 2 with renal complications   . Benign essential HTN     right shoulder  . Hyperlipidemia LDL goal <70   . CKD (chronic kidney disease), stage III   . BPH (benign prostatic hyperplasia)   . Cyst of bursa     R shoulder  . Chronic back pain   . GERD (gastroesophageal reflux disease)    Past Surgical History  Procedure Laterality Date  . Back surgery  1970  . Mass excision  10/24/11    R arm  . Lumbar fusion     . Coronary angioplasty  1994    OM  . Coronary angioplasty  1997    LAD  . Coronary angioplasty with stent placement  1999    RCA  . Coronary angioplasty with stent placement  2001    RCA  . Incision and drainage of wound  2006    axilla  . Hernia repair    . Coronary artery bypass graft  12/27/2011    Procedure: CORONARY ARTERY BYPASS GRAFTING (CABG);  Surgeon: Rexene Alberts, MD;  Location: [place];  Service: Open Heart Surgery;  Laterality: N/A;  Times four. On pump. Using endoscopically harvested right greater saphenous vein and left internal mammary artery.   . Intraoperative transesophageal echocardiogram  12/27/2011    Global hypokinesis with EF of 40-45%, improved LAD distribution wall motion.  . Left heart catheterization with coronary angiogram N/A 12/13/2011    Procedure: LEFT HEART CATHETERIZATION WITH CORONARY ANGIOGRAM;  Surgeon: Leonie Man, MD;  Location: [facility] CATH LAB;  Service: Cardiovascular;  Laterality: N/A;  . Transesophageal echocardiogram  2013  . Cardiac catheterization  2013   Current Outpatient Prescriptions on File Prior to Visit  Medication Sig Dispense Refill  . amLODipine-olmesartan (AZOR) 10-40 MG  per tablet Take 1 tablet by mouth daily. 90 tablet 3  . aspirin EC 81 MG tablet Take 81 mg by mouth daily.    . citalopram (CELEXA) 10 MG tablet TAKE ONE (1) TABLET EACH DAY 30 tablet 5  . doxazosin (CARDURA) 4 MG tablet TAKE ONE (1) TABLET BY MOUTH EVERY DAY 30 tablet 5  . ferrous sulfate 325 (65 FE) MG tablet Take 65 mg by mouth daily with breakfast.    . JANUVIA 50 MG tablet TAKE 1 TABLET BY MOUTH EVERY DAY 90 tablet 0  . naproxen (NAPROSYN) 500 MG tablet Take 500 mg by mouth once.    . nystatin cream (MYCOSTATIN) Apply 1 application topically 3 (three) times daily. Use for 7-14 days 60 g 0  . Omega-3 Fatty Acids (FISH OIL) 1000 MG CAPS Take 1,000 mg by mouth 2 (two) times daily.    [name] omeprazole (PRILOSEC) 40 MG capsule TAKE 1 CAPSULE BY MOUTH DAILY 30  capsule 11  . pravastatin (PRAVACHOL) 20 MG tablet TAKE 1 TABLET BY MOUTH DAILY 30 tablet 0  . sildenafil (VIAGRA) 100 MG tablet Take 0.5-1 tablets (50-100 mg total) by mouth daily as needed for erectile dysfunction. 60 tablet 5   No current facility-administered medications on file prior to visit.   No Known Allergies History   Social History  . Marital Status: Married    Spouse Name: N/A  . Number of Children: N/A  . Years of Education: N/A   Occupational History  . Not on file.   Social History Main Topics  . Smoking status: Former Smoker    Types: Cigarettes    Quit date: 10/01/1986  . Smokeless tobacco: Never Used     Comment: quit about 30 yrs ago  . Alcohol Use: No  . Drug Use: No  . 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 Not on file   Social History Narrative   Married father of one grandfather 29. Works out at [employer] at [employer] roughly 2-3 days a week. He works on a treadmill. He does note having a hard time getting his heart rate up.   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   He quit smoking in 1988, and does not drink alcohol.   Family History  Problem Relation Age of Onset  . Cancer Mother     breast  . Cancer Father     bone  . [place] cancer Neg Hx      Review of Systems  All other systems reviewed and are negative.      Objective:   Physical Exam  Constitutional: He is oriented to person, place, and time. He appears well-developed and well-nourished. No distress.  HENT:  Head: Normocephalic and atraumatic.  Right Ear: External ear normal.  Left Ear: External ear normal.  Nose: Nose normal.  Mouth/Throat: Oropharynx is clear and moist. No oropharyngeal exudate.  Eyes: Conjunctivae and EOM are normal. Pupils are equal, round, and reactive to light. Right eye exhibits no discharge. Left eye exhibits no discharge. No scleral icterus.  Neck:  Normal range of motion. Neck supple. No JVD present. No tracheal deviation present. No thyromegaly present.  Cardiovascular: Normal heart sounds and intact distal pulses.  A regularly irregular rhythm present. Bradycardia present.  Exam reveals no gallop and no friction rub.   No murmur heard. Pulmonary/Chest: Effort normal and breath  sounds normal. No stridor. No respiratory distress. He has no wheezes. He has no rales. He exhibits no tenderness.  Abdominal: Soft. Bowel sounds are normal. He exhibits no distension and no mass. There is no tenderness. There is no rebound and no guarding.  Genitourinary: Rectum normal, prostate normal and penis normal.  Musculoskeletal: Normal range of motion. He exhibits no edema or tenderness.  Lymphadenopathy:    He has no cervical adenopathy.  Neurological: He is alert and oriented to person, place, and time. He has normal reflexes. He displays normal reflexes. No cranial nerve deficit. Coordination normal.  Skin: Skin is warm. No rash noted. He is not diaphoretic. No erythema. No pallor.  Psychiatric: He has a normal mood and affect. His behavior is normal. Judgment and thought content normal.  Vitals reviewed.         Assessment & Plan:  Routine general medical examination at a health care facility - Plan: Fecal occult blood, imunochemical, Fecal occult blood, imunochemical, Fecal occult blood, imunochemical, PSA, Medicare, EKG 12-Lead  Gastroesophageal reflux disease without esophagitis - Plan: pantoprazole (PROTONIX) 40 MG tablet  Diabetes mellitus type II, controlled - Plan: COMPLETE METABOLIC PANEL WITH GFR, CBC with Differential/Platelet, Hemoglobin A1c, Lipid panel  EKG is reassuring. There is no evidence of a need to pursue a pacemaker at the present time. There is no evidence of sick sinus syndrome. I will screen for prostate cancer with a PSA. I will also monitor his diabetes with hemoglobin A1c. I will monitor his chronic kidney disease with  a CMP. I will check his fasting lipid panel. His goal LDL cholesterol is less than 70. I will screen for [place] cancer with fecal occult blood cards 3. Patient also received Prevnar 13 today in office.

## 2015-04-20 LAB — PSA, MEDICARE: PSA: 1.22 ng/mL (ref <=4.00)

## 2015-04-22 ENCOUNTER — Other Ambulatory Visit: Payer: Self-pay | Admitting: Family Medicine

## 2015-04-22 NOTE — Telephone Encounter (Signed)
Medication refilled per protocol. 

## 2015-05-05 ENCOUNTER — Other Ambulatory Visit: Payer: Medicare Other

## 2015-05-05 DIAGNOSIS — Z0000 Encounter for general adult medical examination without abnormal findings: Secondary | ICD-10-CM | POA: Diagnosis not present

## 2015-05-06 LAB — FECAL OCCULT BLOOD, IMMUNOCHEMICAL
FECAL OCCULT BLOOD: NEGATIVE
Fecal Occult Blood: NEGATIVE
Fecal Occult Blood: NEGATIVE

## 2015-05-09 ENCOUNTER — Encounter: Payer: Self-pay | Admitting: Family Medicine

## 2015-05-11 ENCOUNTER — Other Ambulatory Visit: Payer: Self-pay | Admitting: Family Medicine

## 2015-05-12 NOTE — Telephone Encounter (Signed)
Refill appropriate and filled per protocol. 

## 2015-06-27 ENCOUNTER — Other Ambulatory Visit: Payer: Self-pay | Admitting: Family Medicine

## 2015-07-04 ENCOUNTER — Other Ambulatory Visit: Payer: Self-pay | Admitting: Family Medicine

## 2015-07-12 ENCOUNTER — Ambulatory Visit (INDEPENDENT_AMBULATORY_CARE_PROVIDER_SITE_OTHER): Payer: Medicare Other | Admitting: *Deleted

## 2015-07-12 DIAGNOSIS — Z23 Encounter for immunization: Secondary | ICD-10-CM

## 2015-08-17 ENCOUNTER — Other Ambulatory Visit: Payer: Self-pay | Admitting: Family Medicine

## 2015-08-18 NOTE — Telephone Encounter (Signed)
Medication refilled per protocol. 

## 2015-10-15 ENCOUNTER — Other Ambulatory Visit: Payer: Self-pay | Admitting: Family Medicine

## 2015-10-21 ENCOUNTER — Encounter: Payer: Self-pay | Admitting: Family Medicine

## 2015-10-21 ENCOUNTER — Ambulatory Visit (INDEPENDENT_AMBULATORY_CARE_PROVIDER_SITE_OTHER): Payer: Medicare Other | Admitting: Family Medicine

## 2015-10-21 VITALS — BP 130/64 | HR 32 | Temp 97.9°F | Resp 14 | Ht 68.0 in | Wt 162.0 lb

## 2015-10-21 DIAGNOSIS — I2510 Atherosclerotic heart disease of native coronary artery without angina pectoris: Secondary | ICD-10-CM | POA: Diagnosis not present

## 2015-10-21 DIAGNOSIS — E119 Type 2 diabetes mellitus without complications: Secondary | ICD-10-CM | POA: Diagnosis not present

## 2015-10-21 DIAGNOSIS — I10 Essential (primary) hypertension: Secondary | ICD-10-CM | POA: Diagnosis not present

## 2015-10-21 DIAGNOSIS — E785 Hyperlipidemia, unspecified: Secondary | ICD-10-CM

## 2015-10-21 LAB — CBC WITH DIFFERENTIAL/PLATELET
Basophils Absolute: 0 10*3/uL (ref 0.0–0.1)
Basophils Relative: 0 % (ref 0–1)
EOS ABS: 0.3 10*3/uL (ref 0.0–0.7)
EOS PCT: 4 % (ref 0–5)
HCT: 37.7 % — ABNORMAL LOW (ref 39.0–52.0)
Hemoglobin: 12.6 g/dL — ABNORMAL LOW (ref 13.0–17.0)
LYMPHS ABS: 1.1 10*3/uL (ref 0.7–4.0)
LYMPHS PCT: 16 % (ref 12–46)
MCH: 31.3 pg (ref 26.0–34.0)
MCHC: 33.4 g/dL (ref 30.0–36.0)
MCV: 93.5 fL (ref 78.0–100.0)
MONOS PCT: 7 % (ref 3–12)
MPV: 11.7 fL (ref 8.6–12.4)
Monocytes Absolute: 0.5 10*3/uL (ref 0.1–1.0)
Neutro Abs: 5 10*3/uL (ref 1.7–7.7)
Neutrophils Relative %: 73 % (ref 43–77)
Platelets: 189 10*3/uL (ref 150–400)
RBC: 4.03 MIL/uL — ABNORMAL LOW (ref 4.22–5.81)
RDW: 13.8 % (ref 11.5–15.5)
WBC: 6.8 10*3/uL (ref 4.0–10.5)

## 2015-10-21 LAB — COMPLETE METABOLIC PANEL WITH GFR
ALT: 11 U/L (ref 9–46)
AST: 13 U/L (ref 10–35)
Albumin: 4.1 g/dL (ref 3.6–5.1)
Alkaline Phosphatase: 69 U/L (ref 40–115)
BUN: 37 mg/dL — ABNORMAL HIGH (ref 7–25)
CHLORIDE: 109 mmol/L (ref 98–110)
CO2: 21 mmol/L (ref 20–31)
CREATININE: 2.01 mg/dL — AB (ref 0.70–1.18)
Calcium: 9.7 mg/dL (ref 8.6–10.3)
GFR, EST AFRICAN AMERICAN: 37 mL/min — AB (ref >=60)
GFR, EST NON AFRICAN AMERICAN: 32 mL/min — AB (ref >=60)
Glucose, Bld: 121 mg/dL — ABNORMAL HIGH (ref 70–99)
Potassium: 4.9 mmol/L (ref 3.5–5.3)
Sodium: 141 mmol/L (ref 135–146)
TOTAL PROTEIN: 6.7 g/dL (ref 6.1–8.1)
Total Bilirubin: 1.1 mg/dL (ref 0.2–1.2)

## 2015-10-21 LAB — LIPID PANEL
Cholesterol: 107 mg/dL — ABNORMAL LOW (ref 125–200)
HDL: 35 mg/dL — ABNORMAL LOW (ref >=40)
LDL Cholesterol: 58 mg/dL (ref <130)
Total CHOL/HDL Ratio: 3.1 Ratio (ref <=5.0)
Triglycerides: 72 mg/dL (ref <150)
VLDL: 14 mg/dL (ref <30)

## 2015-10-21 MED ORDER — MELOXICAM 7.5 MG PO TABS: 7.5000 mg | ORAL_TABLET | Freq: Every day | ORAL | Status: DC

## 2015-10-21 MED ORDER — CELECOXIB 200 MG PO CAPS: 200.0000 mg | ORAL_CAPSULE | Freq: Every day | ORAL | Status: DC

## 2015-10-21 NOTE — Progress Notes (Signed)
Subjective:    Patient ID: Bradley Black, male    DOB: May 21, 1946, 70 y.o.   MRN: [id number]  HPI 7/16 Patient is here today for complete physical exam. He denies any complaints today. Coincidentally the patient was found to be profoundly bradycardic. On my exam he has an irregularly irregular rhythm with a heart rate of approximately 50 bpm. Patient is having frequent PVCs every fourth or fifth heartbeat. He denies any syncope or presyncope. He denies any chest pain or shortness of breath. He does have chronic fatigue. EKG today shows sinus bradycardia with frequent PVCs. He also has chronic stable ST changes in the inferior leads that are old. Patient is due for Prevnar 13. Pneumovax 23 is up-to-date. He declines a tetanus shot. Prostate exam is performed today and is normal. I will also check a PSA. Patient refuses a colonoscopy because he cannot tolerate the bowel prep.  At that time, my plan was: EKG is reassuring. There is no evidence of a need to pursue a pacemaker at the present time. There is no evidence of sick sinus syndrome. I will screen for prostate cancer with a PSA. I will also monitor his diabetes with hemoglobin A1c. I will monitor his chronic kidney disease with a CMP. I will check his fasting lipid panel. His goal LDL cholesterol is less than 70. I will screen for [place] cancer with fecal occult blood cards 3. Patient also received Prevnar 13 today in office.  10/21/15 Patient presents today for just her regular checkup. His only complaint is that he feels tired and has fatigue. He denies any chest pain or shortness of breath. He is adamant that he does not have syncope or presyncope. He denies dizziness. However on examination today he is extremely bradycardic. I confirmed this myself with a heart rate in the mid 30s. He also has frequent PVCs.  By the time I performed an EKG, his heart rate was in the mid and upper 40s.  There was no significant change in his rhythm compared to his  last EKG. There are Q waves still present but this is a chronic finding. However the patient does complain of severe fatigue. Otherwise he denies any hypoglycemia, peripheral neuropathy, chest pain shortness of breath. He denies any myalgias or right upper quadrant pain. He denies any polyuria, polydipsia, or blurred vision. He is requesting something for arthritis to help the pain in his knees and his back. Past Medical History  Diagnosis Date  . Acute myocardial infarction, History of: 1988    PTCA of OM; PTCA   . CAD S/P percutaneous coronary angioplasty 1988 - 2001    PTCA of [place]; PCI with BMS to LAD in 1997; RCA PCI BMS 1999 & 200  . CAD in native artery 11/2011    Cath: MV Dz -- prox LAD with D1&2, Large OM (both branches) & diffuse RCA disease --> CABG x 4  . 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 Ischemic cardiomyopathy 11/2011    Intra-OP TEE: EF 40-45%, no regional WMA; improved Anterior WM post CABG.  . DM (diabetes mellitus), type 2 with renal complications   . Benign essential HTN     right shoulder  . Hyperlipidemia LDL goal <70   . CKD (chronic kidney disease), stage III   . BPH (benign prostatic hyperplasia)   . Cyst of  bursa     R shoulder  . Chronic back pain   . GERD (gastroesophageal reflux disease)    Past Surgical History  Procedure Laterality Date  . Back surgery  1970  . Mass excision  10/24/11    R arm  . Lumbar fusion    . Coronary angioplasty  1994    OM  . Coronary angioplasty  1997    LAD  . Coronary angioplasty with stent placement  1999    RCA  . Coronary angioplasty with stent placement  2001    RCA  . Incision and drainage of wound  2006    axilla  . Hernia repair    . Coronary artery bypass graft  12/27/2011    Procedure: CORONARY ARTERY BYPASS GRAFTING (CABG);  Surgeon: Rexene Alberts, MD;  Location: [place];  Service: Open Heart Surgery;  Laterality: N/A;  Times four.  On pump. Using endoscopically harvested right greater saphenous vein and left internal mammary artery.   . Intraoperative transesophageal echocardiogram  12/27/2011    Global hypokinesis with EF of 40-45%, improved LAD distribution wall motion.  . Left heart catheterization with coronary angiogram N/A 12/13/2011    Procedure: LEFT HEART CATHETERIZATION WITH CORONARY ANGIOGRAM;  Surgeon: Leonie Man, MD;  Location: [facility] CATH LAB;  Service: Cardiovascular;  Laterality: N/A;  . Transesophageal echocardiogram  2013  . Cardiac catheterization  2013   Current Outpatient Prescriptions on File Prior to Visit  Medication Sig Dispense Refill  . aspirin EC 81 MG tablet Take 81 mg by mouth daily.    . AZOR 10-40 MG tablet TAKE 1 TABLET BY MOUTH EVERY DAY 90 tablet 1  . citalopram (CELEXA) 10 MG tablet TAKE ONE (1) TABLET EACH DAY 30 tablet 11  . doxazosin (CARDURA) 4 MG tablet TAKE ONE (1) TABLET BY MOUTH EVERY DAY 90 tablet 1  . ferrous sulfate 325 (65 FE) MG tablet Take 65 mg by mouth daily with breakfast.    . JANUVIA 50 MG tablet TAKE 1 TABLET BY MOUTH EVERY DAY 90 tablet 3  . naproxen (NAPROSYN) 500 MG tablet Take 500 mg by mouth once.    . nystatin cream (MYCOSTATIN) Apply 1 application topically 3 (three) times daily. Use for 7-14 days 60 g 0  . Omega-3 Fatty Acids (FISH OIL) 1000 MG CAPS Take 1,000 mg by mouth 2 (two) times daily.    [name] omeprazole (PRILOSEC) 40 MG capsule TAKE 1 CAPSULE BY MOUTH DAILY 30 capsule 11  . pantoprazole (PROTONIX) 40 MG tablet TAKE 1 TABLET(40 MG) BY MOUTH TWICE DAILY 180 tablet 0  . pravastatin (PRAVACHOL) 20 MG tablet TAKE 1 TABLET BY MOUTH DAILY 30 tablet 6  . sildenafil (VIAGRA) 100 MG tablet Take 0.5-1 tablets (50-100 mg total) by mouth daily as needed for erectile dysfunction. 60 tablet 5   No current facility-administered medications on file prior to visit.   No Known Allergies Social History   Social History  . Marital Status: Married    Spouse Name: N/A   . Number of Children: N/A  . Years of Education: N/A   Occupational History  . Not on file.   Social History Main Topics  . Smoking status: Former Smoker    Types: Cigarettes    Quit date: 10/01/1986  . Smokeless tobacco: Never Used     Comment: quit about 30 yrs ago  . Alcohol Use: No  . Drug Use: No  . 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 Not on file   Social History Narrative   Married father of one grandfather 21. Works out at [employer] at [employer] roughly 2-3 days a week. He works on a treadmill. He does note having a hard time getting his heart rate up.   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   He quit smoking in 1988, and does not drink alcohol.   Family History  Problem Relation Age of Onset  . Cancer Mother     breast  . Cancer Father     bone  . [place] cancer Neg Hx      Review of Systems  All other systems reviewed and are negative.      Objective:   Physical Exam  Constitutional: He is oriented to person, place, and time. He appears well-developed and well-nourished. No distress.  HENT:  Head: Normocephalic and atraumatic.  Right Ear: External ear normal.  Left Ear: External ear normal.  Nose: Nose normal.  Mouth/Throat: Oropharynx is clear and moist. No oropharyngeal exudate.  Eyes: Conjunctivae and EOM are normal. Pupils are equal, round, and reactive to light. Right eye exhibits no discharge. Left eye exhibits no discharge. No scleral icterus.  Neck: Normal range of motion. Neck supple. No JVD present. No tracheal deviation present. No thyromegaly present.  Cardiovascular: Normal heart sounds and intact distal pulses.  A regularly irregular rhythm present. Bradycardia present.  Exam reveals no gallop and no friction rub.   No murmur heard. Pulmonary/Chest: Effort normal and breath sounds normal. No stridor. No respiratory distress. He has no  wheezes. He has no rales. He exhibits no tenderness.  Abdominal: Soft. Bowel sounds are normal. He exhibits no distension and no mass. There is no tenderness. There is no rebound and no guarding.  Genitourinary: Rectum normal, prostate normal and penis normal.  Musculoskeletal: Normal range of motion. He exhibits no edema or tenderness.  Lymphadenopathy:    He has no cervical adenopathy.  Neurological: He is alert and oriented to person, place, and time. He has normal reflexes. No cranial nerve deficit. Coordination normal.  Skin: Skin is warm. No rash noted. He is not diaphoretic. No erythema. No pallor.  Psychiatric: He has a normal mood and affect. His behavior is normal. Judgment and thought content normal.  Vitals reviewed.         Assessment & Plan:  ASCVD (arteriosclerotic cardiovascular disease) - Plan: EKG 12-Lead, Ambulatory referral to Cardiology  Controlled type 2 diabetes mellitus without complication, without long-term current use of insulin ([place]) - Plan: CBC with Differential/Platelet, COMPLETE METABOLIC PANEL WITH GFR, Hemoglobin A1c, Microalbumin, urine, Lipid panel  Benign essential HTN  HLD (hyperlipidemia)  I'm referring the patient back to his cardiologist. I believe a heart rate in the 40s and into the 30s at times is possibly the cause of his fatigue and he may benefit for evaluation for possible pacemaker placement. I will also check a hemoglobin A1c with a goal hemoglobin A1c less than 6.5. I will check a fasting lipid panel with a goal LDL below 70. His blood pressure is at goal. I will start the patient on Celebrex 200 mg by mouth daily for arthritis. He is on aspirin and therefore there is slightly elevated risk of stomach bleeding. However he is on pantoprazole twice a day which reduces significantly. If the Celebrex is too expensive I will place him on meloxicam  7.5 mg by mouth daily. Await cardiology recommendations

## 2015-10-22 LAB — MICROALBUMIN, URINE: Microalb, Ur: 26 mg/dL

## 2015-10-22 LAB — HEMOGLOBIN A1C
HEMOGLOBIN A1C: 6.4 % — AB (ref <5.7)
MEAN PLASMA GLUCOSE: 137 mg/dL — AB (ref <117)

## 2015-10-25 ENCOUNTER — Other Ambulatory Visit: Payer: Self-pay | Admitting: Family Medicine

## 2015-10-27 ENCOUNTER — Ambulatory Visit (INDEPENDENT_AMBULATORY_CARE_PROVIDER_SITE_OTHER): Payer: Medicare Other | Admitting: Cardiovascular Disease

## 2015-10-27 ENCOUNTER — Encounter: Payer: Self-pay | Admitting: Cardiovascular Disease

## 2015-10-27 VITALS — BP 142/78 | HR 56 | Ht 68.0 in | Wt 162.0 lb

## 2015-10-27 DIAGNOSIS — I25708 Atherosclerosis of coronary artery bypass graft(s), unspecified, with other forms of angina pectoris: Secondary | ICD-10-CM

## 2015-10-27 DIAGNOSIS — R001 Bradycardia, unspecified: Secondary | ICD-10-CM | POA: Diagnosis not present

## 2015-10-27 DIAGNOSIS — E785 Hyperlipidemia, unspecified: Secondary | ICD-10-CM

## 2015-10-27 DIAGNOSIS — R600 Localized edema: Secondary | ICD-10-CM | POA: Diagnosis not present

## 2015-10-27 DIAGNOSIS — R5383 Other fatigue: Secondary | ICD-10-CM

## 2015-10-27 DIAGNOSIS — I429 Cardiomyopathy, unspecified: Secondary | ICD-10-CM

## 2015-10-27 DIAGNOSIS — I10 Essential (primary) hypertension: Secondary | ICD-10-CM

## 2015-10-27 MED ORDER — FUROSEMIDE 20 MG PO TABS: 20.0000 mg | ORAL_TABLET | ORAL | Status: DC | PRN

## 2015-10-27 MED ORDER — POTASSIUM CHLORIDE ER 10 MEQ PO TBCR: 10.0000 meq | EXTENDED_RELEASE_TABLET | ORAL | Status: DC | PRN

## 2015-10-27 NOTE — Progress Notes (Signed)
Patient ID: Bradley Black, male   DOB: 1945/12/08, 70 y.o.   MRN: [id number]       CARDIOLOGY CONSULT NOTE  Patient ID: Bradley Black MRN: [id number] DOB/AGE: 01-16-1946 70 y.o.  Admit date: (Not on file) Primary Physician Odette Fraction, MD  Reason for Consultation: bradycardia, fatigue, CABG  HPI: The patient is a 70 year old male history of coronary artery disease and CABG on 12/27/2011.   prior to CABG he had undergone multiple rounds of PTCA and coronary artery stenting. He had been on a beta blocker several years ago but it was stopped due to bradycardia. He has been experiencing bradycardia and fatigue and his PCP, Dr. Dennard Schaumann, wondered whether or not pacemaker implantation is warranted.   ECG 10/21/15 showed sinus bradycardia, heart rate 49 bpm , with old inferior and anterior infarct, nonspecific IVCD.  When PCP evaluated him, HR was in mid 30 bpm range.  Most recent echocardiogram I find was performed on 12/27/11 and was a transesophageal echo which showed EF 40-45%.  Lipids on 04/19/15 showed total cholesterol 125, triglycerides 143, HDL 31, LDL 65.  He has been experiencing fatigue for the past 6 months. He denies chest pain and shortness of breath. He has had bilateral leg swelling which has been going on for some time. He is not taking any diuretics. He denies dizziness and syncope.   No Known Allergies  Current Outpatient Prescriptions  Medication Sig Dispense Refill  . aspirin EC 81 MG tablet Take 81 mg by mouth daily.    . AZOR 10-40 MG tablet TAKE 1 TABLET BY MOUTH EVERY DAY 90 tablet 1  . celecoxib (CELEBREX) 200 MG capsule Take 1 capsule (200 mg total) by mouth daily. 30 capsule 3  . citalopram (CELEXA) 10 MG tablet TAKE ONE (1) TABLET EACH DAY 30 tablet 11  . doxazosin (CARDURA) 4 MG tablet TAKE ONE (1) TABLET BY MOUTH EVERY DAY 90 tablet 3  . JANUVIA 50 MG tablet TAKE 1 TABLET BY MOUTH EVERY DAY 90 tablet 3  . pantoprazole (PROTONIX) 40 MG tablet TAKE 1  TABLET(40 MG) BY MOUTH TWICE DAILY 180 tablet 0  . pravastatin (PRAVACHOL) 20 MG tablet TAKE 1 TABLET BY MOUTH DAILY 30 tablet 6  . sildenafil (VIAGRA) 100 MG tablet Take 0.5-1 tablets (50-100 mg total) by mouth daily as needed for erectile dysfunction. 60 tablet 5   No current facility-administered medications for this visit.    Past Medical History  Diagnosis Date  . Acute myocardial infarction, History of: 1988    PTCA of OM; PTCA   . CAD S/P percutaneous coronary angioplasty 1988 - 2001    PTCA of [place]; PCI with BMS to LAD in 1997; RCA PCI BMS 1999 & 200  . CAD in native artery 11/2011    Cath: MV Dz -- prox LAD with D1&2, Large OM (both branches) & diffuse RCA disease --> CABG x 4  . 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 Ischemic cardiomyopathy 11/2011    Intra-OP TEE: EF 40-45%, no regional WMA; improved Anterior WM post CABG.  . DM (diabetes mellitus), type 2 with renal complications ([place])   . Benign essential HTN     right shoulder  . Hyperlipidemia LDL goal <70   . CKD (chronic kidney disease), stage III   . BPH (benign prostatic hyperplasia)   . Cyst  of bursa     R shoulder  . Chronic back pain   . GERD (gastroesophageal reflux disease)     Past Surgical History  Procedure Laterality Date  . Back surgery  1970  . Mass excision  10/24/11    R arm  . Lumbar fusion    . Coronary angioplasty  1994    OM  . Coronary angioplasty  1997    LAD  . Coronary angioplasty with stent placement  1999    RCA  . Coronary angioplasty with stent placement  2001    RCA  . Incision and drainage of wound  2006    axilla  . Hernia repair    . Coronary artery bypass graft  12/27/2011    Procedure: CORONARY ARTERY BYPASS GRAFTING (CABG);  Surgeon: Rexene Alberts, MD;  Location: [place];  Service: Open Heart Surgery;  Laterality: N/A;  Times four. On pump. Using endoscopically harvested right greater saphenous  vein and left internal mammary artery.   . Intraoperative transesophageal echocardiogram  12/27/2011    Global hypokinesis with EF of 40-45%, improved LAD distribution wall motion.  . Left heart catheterization with coronary angiogram N/A 12/13/2011    Procedure: LEFT HEART CATHETERIZATION WITH CORONARY ANGIOGRAM;  Surgeon: Leonie Man, MD;  Location: [facility] CATH LAB;  Service: Cardiovascular;  Laterality: N/A;  . Transesophageal echocardiogram  2013  . Cardiac catheterization  2013    Social History   Social History  . Marital Status: Married    Spouse Name: N/A  . Number of Children: N/A  . Years of Education: N/A   Occupational History  . Not on file.   Social History Main Topics  . Smoking status: Former Smoker    Types: Cigarettes    Quit date: 10/01/1986  . Smokeless tobacco: Never Used     Comment: quit about 30 yrs ago  . Alcohol Use: No  . Drug Use: No  . 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 Not on file   Social History Narrative   Married father of one grandfather 62. Works out at [employer] at [employer] roughly 2-3 days a week. He works on a treadmill. He does note having a hard time getting his heart rate up.   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   He quit smoking in 1988, and does not drink alcohol.     No family history of premature CAD in 1st degree relatives.  Prior to Admission medications   Medication Sig Start Date End Date Taking? Authorizing Provider  aspirin EC 81 MG tablet Take 81 mg by mouth daily.   Yes Historical Provider, MD  AZOR 10-40 MG tablet TAKE 1 TABLET BY MOUTH EVERY DAY 10/17/15  Yes Susy Frizzle, MD  celecoxib (CELEBREX) 200 MG capsule Take 1 capsule (200 mg total) by mouth daily. 10/21/15  Yes Susy Frizzle, MD  citalopram (CELEXA) 10 MG tablet TAKE ONE (1) TABLET EACH DAY 07/04/15  Yes Susy Frizzle, MD  doxazosin (CARDURA)  4 MG tablet TAKE ONE (1) TABLET BY MOUTH EVERY DAY 10/25/15  Yes Susy Frizzle, MD  JANUVIA 50 MG tablet TAKE 1 TABLET BY MOUTH EVERY DAY 06/27/15  Yes Susy Frizzle, MD  pantoprazole (PROTONIX) 40 MG tablet TAKE 1 TABLET(40 MG) BY MOUTH TWICE DAILY 08/18/15  Yes Susy Frizzle, MD  pravastatin (PRAVACHOL) 20 MG tablet TAKE 1 TABLET BY MOUTH DAILY 05/12/15  Yes Susy Frizzle, MD  sildenafil (VIAGRA) 100 MG tablet Take 0.5-1 tablets (50-100 mg total) by mouth daily as needed for erectile dysfunction. 01/11/15  Yes Susy Frizzle, MD     Review of systems complete and found to be negative unless listed above in HPI     Physical exam Blood pressure 142/78, pulse 56, height 5\' 8"  (1.727 m), weight 162 lb (73.483 kg), SpO2 98 %. General: NAD Neck: No JVD, no thyromegaly or thyroid nodule.  Lungs: Clear to auscultation bilaterally with normal respiratory effort. CV: Nondisplaced PMI. Bradycardic, regular rhythm, normal S1/S2, no S3/S4, no murmur.  Trace pitting pretibial edema bilaterally.  No carotid bruit.   Abdomen: Soft, nontender, no distention.  Skin: Intact without lesions or rashes.  Neurologic: Alert and oriented x 3.  Psych: Normal affect. Extremities: No clubbing or cyanosis.  HEENT: Normal.   ECG: Most recent ECG reviewed.  Labs:   Lab Results  Component Value Date   WBC 6.8 10/21/2015   HGB 12.6* 10/21/2015   HCT 37.7* 10/21/2015   MCV 93.5 10/21/2015   PLT 189 10/21/2015    Recent Labs Lab 10/21/15 0835  NA 141  K 4.9  CL 109  CO2 21  BUN 37*  CREATININE 2.01*  CALCIUM 9.7  PROT 6.7  BILITOT 1.1  ALKPHOS 69  ALT 11  AST 13  GLUCOSE 121*   No results found for: CKTOTAL, CKMB, CKMBINDEX, TROPONINI Lab Results  Component Value Date   CHOL 107* 10/21/2015   CHOL 125 04/19/2015   CHOL 134 10/05/2014   Lab Results  Component Value Date   HDL 35* 10/21/2015   HDL 31* 04/19/2015   HDL 38* 10/05/2014   Lab Results  Component Value Date    LDLCALC 58 10/21/2015   LDLCALC 65 04/19/2015   LDLCALC 82 10/05/2014   Lab Results  Component Value Date   TRIG 72 10/21/2015   TRIG 143 04/19/2015   TRIG 72 10/05/2014   Lab Results  Component Value Date   CHOLHDL 3.1 10/21/2015   CHOLHDL 4.0 04/19/2015   CHOLHDL 3.5 10/05/2014   No results found for: LDLDIRECT       Studies: No results found.  ASSESSMENT AND PLAN:  1. Bradycardia and fatigue: Will check TSH. Will obtain one-week event monitor to see if he is having symptomatic bradycardia. If so, pacemaker placement is warranted. Not on AV nodal blocking agents.  2. CAD with CABG in 11/2011: Stable ischemic heart disease. Continue ASA and statin.  3. Essential HTN: Mildly elevated. Will monitor.  4. Hyperlipidemia: Controlled on pravastatin 20 mg. No changes.  5. Bilateral leg edema/cardiomyopathy: Will obtain echocardiogram. Start Lasix 20 mg prn and 10 meq KCl. Check BMET in one week.  Dispo: 6 weeks.   Signed: Kate Sable, M.D., F.A.C.C.  10/27/2015, 10:34 AM

## 2015-10-27 NOTE — Patient Instructions (Addendum)
Your physician has requested that you have an echocardiogram. Echocardiography is a painless test that uses sound waves to create images of your heart. It provides your doctor with information about the size and shape of your heart and how well your heart's chambers and valves are working. This procedure takes approximately one hour. There are no restrictions for this procedure. Your physician has recommended that you wear a 1 week event monitor. Event monitors are medical devices that record the heart's electrical activity. Doctors most often [place] these monitors to diagnose arrhythmias. Arrhythmias are problems with the speed or rhythm of the heartbeat. The monitor is a small, portable device. You can wear one while you do your normal daily activities. This is usually used to diagnose what is causing palpitations/syncope (passing out). Lab for TSH, BMET - due in 1 week, around 11/03/2015.   Office will contact with results via phone or letter.   Begin Lasix 20mg  as needed. Begin Potassium 77meq as needed on days that you have to take your Lasix. New prescriptions sent to [place] today.  Continue all other medications.   Follow up in  1 month.

## 2015-10-28 ENCOUNTER — Other Ambulatory Visit: Payer: Medicare Other

## 2015-10-29 DIAGNOSIS — R5383 Other fatigue: Secondary | ICD-10-CM | POA: Diagnosis not present

## 2015-10-29 DIAGNOSIS — R001 Bradycardia, unspecified: Secondary | ICD-10-CM | POA: Diagnosis not present

## 2015-10-31 ENCOUNTER — Encounter (INDEPENDENT_AMBULATORY_CARE_PROVIDER_SITE_OTHER): Payer: Medicare Other

## 2015-10-31 DIAGNOSIS — R001 Bradycardia, unspecified: Secondary | ICD-10-CM | POA: Diagnosis not present

## 2015-10-31 DIAGNOSIS — R5383 Other fatigue: Secondary | ICD-10-CM

## 2015-11-03 ENCOUNTER — Other Ambulatory Visit: Payer: Self-pay | Admitting: Cardiovascular Disease

## 2015-11-03 DIAGNOSIS — R001 Bradycardia, unspecified: Secondary | ICD-10-CM | POA: Diagnosis not present

## 2015-11-03 DIAGNOSIS — R5383 Other fatigue: Secondary | ICD-10-CM | POA: Diagnosis not present

## 2015-11-03 DIAGNOSIS — R600 Localized edema: Secondary | ICD-10-CM | POA: Diagnosis not present

## 2015-11-03 DIAGNOSIS — I10 Essential (primary) hypertension: Secondary | ICD-10-CM | POA: Diagnosis not present

## 2015-11-03 LAB — BASIC METABOLIC PANEL
BUN: 52 mg/dL — ABNORMAL HIGH (ref 7–25)
CALCIUM: 9.8 mg/dL (ref 8.6–10.3)
CO2: 21 mmol/L (ref 20–31)
Chloride: 104 mmol/L (ref 98–110)
Creat: 3.02 mg/dL — ABNORMAL HIGH (ref 0.70–1.18)
Glucose, Bld: 202 mg/dL — ABNORMAL HIGH (ref 65–99)
POTASSIUM: 5.1 mmol/L (ref 3.5–5.3)
SODIUM: 138 mmol/L (ref 135–146)

## 2015-11-03 LAB — TSH: TSH: 2.456 u[IU]/mL (ref 0.350–4.500)

## 2015-11-04 ENCOUNTER — Telehealth: Payer: Self-pay | Admitting: *Deleted

## 2015-11-04 DIAGNOSIS — N189 Chronic kidney disease, unspecified: Secondary | ICD-10-CM

## 2015-11-04 DIAGNOSIS — N289 Disorder of kidney and ureter, unspecified: Principal | ICD-10-CM

## 2015-11-04 NOTE — Telephone Encounter (Signed)
-----   Message from Herminio Commons, MD sent at 11/04/2015  9:39 AM EST ----- Acute on chronic renal insufficiency. Await echo results. Stop Lasix if he has been taking daily. Refer to nephrology.

## 2015-11-04 NOTE — Telephone Encounter (Signed)
Notes Recorded by Laurine Blazer, LPN on [id number] at [id number] PM Left message to return call.

## 2015-11-07 NOTE — Telephone Encounter (Signed)
Notes Recorded by Laurine Blazer, LPN on [id number] at [id number] PM Patient notified. Copy to pmd. Patient agrees to seeing Nephrologist. Order put in & fwd to [facility]) for scheduling.

## 2015-11-10 ENCOUNTER — Telehealth: Payer: Self-pay | Admitting: Cardiovascular Disease

## 2015-11-10 ENCOUNTER — Other Ambulatory Visit: Payer: Self-pay

## 2015-11-10 ENCOUNTER — Ambulatory Visit (INDEPENDENT_AMBULATORY_CARE_PROVIDER_SITE_OTHER): Payer: Medicare Other

## 2015-11-10 DIAGNOSIS — R600 Localized edema: Secondary | ICD-10-CM

## 2015-11-10 DIAGNOSIS — I429 Cardiomyopathy, unspecified: Secondary | ICD-10-CM

## 2015-11-10 NOTE — Telephone Encounter (Signed)
Noted  

## 2015-11-10 NOTE — Telephone Encounter (Signed)
11/10/2015 Mr. Clauss walked into the office requesting that we cancel the referral to Nephrology. States that he wants to go to his PCP.

## 2015-11-11 ENCOUNTER — Telehealth: Payer: Self-pay | Admitting: *Deleted

## 2015-11-11 NOTE — Telephone Encounter (Signed)
-----   Message from Herminio Commons, MD sent at 11/10/2015  2:29 PM EST ----- Pumping function mildly reduced and relatively stable when compared to last echo.

## 2015-11-11 NOTE — Telephone Encounter (Signed)
Patient returned your call Try home and if not home the call cell [phone]

## 2015-11-11 NOTE — Telephone Encounter (Signed)
Pt made aware and will f/u 2/28 OV.

## 2015-11-11 NOTE — Telephone Encounter (Signed)
Notes Recorded by Laurine Blazer, LPN on [id number] at [id number] AM Left message to return call with wife.

## 2015-11-16 ENCOUNTER — Other Ambulatory Visit: Payer: Self-pay | Admitting: Family Medicine

## 2015-11-16 MED ORDER — PANTOPRAZOLE SODIUM 40 MG PO TBEC: DELAYED_RELEASE_TABLET | ORAL | Status: DC

## 2015-11-17 ENCOUNTER — Telehealth: Payer: Self-pay | Admitting: *Deleted

## 2015-11-17 NOTE — Telephone Encounter (Signed)
Notes Recorded by Laurine Blazer, LPN on [id number] at [id number] PM Left message to return call.

## 2015-11-17 NOTE — Telephone Encounter (Signed)
-----   Message from Herminio Commons, MD sent at 11/15/2015 11:39 AM EST ----- No significant arrhythmias.

## 2015-11-17 NOTE — Telephone Encounter (Signed)
Notes Recorded by Laurine Blazer, LPN on [id number] at 624THL PM Patient notified. Copy to pmd. Follow up scheduled for 11/29/15 with Dr. Bronson Ing

## 2015-11-29 ENCOUNTER — Ambulatory Visit (INDEPENDENT_AMBULATORY_CARE_PROVIDER_SITE_OTHER): Payer: Medicare Other | Admitting: Cardiovascular Disease

## 2015-11-29 ENCOUNTER — Encounter: Payer: Self-pay | Admitting: Cardiovascular Disease

## 2015-11-29 VITALS — BP 150/60 | HR 35 | Ht 68.0 in | Wt 163.0 lb

## 2015-11-29 DIAGNOSIS — G4733 Obstructive sleep apnea (adult) (pediatric): Secondary | ICD-10-CM

## 2015-11-29 DIAGNOSIS — R0989 Other specified symptoms and signs involving the circulatory and respiratory systems: Secondary | ICD-10-CM

## 2015-11-29 DIAGNOSIS — R0683 Snoring: Secondary | ICD-10-CM

## 2015-11-29 DIAGNOSIS — N289 Disorder of kidney and ureter, unspecified: Secondary | ICD-10-CM

## 2015-11-29 DIAGNOSIS — R001 Bradycardia, unspecified: Secondary | ICD-10-CM | POA: Diagnosis not present

## 2015-11-29 DIAGNOSIS — I5042 Chronic combined systolic (congestive) and diastolic (congestive) heart failure: Secondary | ICD-10-CM

## 2015-11-29 DIAGNOSIS — I10 Essential (primary) hypertension: Secondary | ICD-10-CM

## 2015-11-29 DIAGNOSIS — R5383 Other fatigue: Secondary | ICD-10-CM

## 2015-11-29 DIAGNOSIS — E785 Hyperlipidemia, unspecified: Secondary | ICD-10-CM

## 2015-11-29 DIAGNOSIS — I25708 Atherosclerosis of coronary artery bypass graft(s), unspecified, with other forms of angina pectoris: Secondary | ICD-10-CM

## 2015-11-29 DIAGNOSIS — N189 Chronic kidney disease, unspecified: Secondary | ICD-10-CM

## 2015-11-29 DIAGNOSIS — I429 Cardiomyopathy, unspecified: Secondary | ICD-10-CM

## 2015-11-29 DIAGNOSIS — R600 Localized edema: Secondary | ICD-10-CM

## 2015-11-29 MED ORDER — DOXAZOSIN MESYLATE 8 MG PO TABS: 8.0000 mg | ORAL_TABLET | Freq: Every day | ORAL | Status: DC

## 2015-11-29 NOTE — Progress Notes (Addendum)
Patient ID: Bradley Black, male   DOB: 12-Mar-1946, 70 y.o.   MRN: [id number]      SUBJECTIVE: The patient returns for follow-up after undergoing cardiovascular testing performed for the evaluation of bradycardia and fatigue. Event monitoring did not demonstrate any significant bradycardia and predominantly showed sinus rhythm with PVCs.  Echocardiogram on 11/10/15 demonstrated mildly reduced left ventricular systolic function, EF [id number], grade 2 diastolic dysfunction with increased filling pressures , and mild mitral regurgitation.  He continues to feel fatigued. This has been going on for 6 months. He denies chest pain and shortness of breath. He does admit to snoring a lot but has never formally been diagnosed with sleep apnea.  Review of Systems: As per "subjective", otherwise negative.  No Known Allergies  Current Outpatient Prescriptions  Medication Sig Dispense Refill  . aspirin EC 81 MG tablet Take 81 mg by mouth daily.    . AZOR 10-40 MG tablet TAKE 1 TABLET BY MOUTH EVERY DAY 90 tablet 1  . celecoxib (CELEBREX) 200 MG capsule Take 1 capsule (200 mg total) by mouth daily. 30 capsule 3  . citalopram (CELEXA) 10 MG tablet TAKE ONE (1) TABLET EACH DAY 30 tablet 11  . doxazosin (CARDURA) 4 MG tablet TAKE ONE (1) TABLET BY MOUTH EVERY DAY 90 tablet 3  . JANUVIA 50 MG tablet TAKE 1 TABLET BY MOUTH EVERY DAY 90 tablet 3  . pantoprazole (PROTONIX) 40 MG tablet TAKE 1 TABLET(40 MG) BY MOUTH TWICE DAILY 180 tablet 4  . potassium chloride (K-DUR) 10 MEQ tablet Take 1 tablet (10 mEq total) by mouth as needed (on the days that you have to take your Lasix.). 30 tablet 3  . pravastatin (PRAVACHOL) 20 MG tablet TAKE 1 TABLET BY MOUTH DAILY 30 tablet 6  . sildenafil (VIAGRA) 100 MG tablet Take 0.5-1 tablets (50-100 mg total) by mouth daily as needed for erectile dysfunction. 60 tablet 5   No current facility-administered medications for this visit.    Past Medical History  Diagnosis Date  . Acute  myocardial infarction, History of: 1988    PTCA of OM; PTCA   . CAD S/P percutaneous coronary angioplasty 1988 - 2001    PTCA of [place]; PCI with BMS to LAD in 1997; RCA PCI BMS 1999 & 200  . CAD in native artery 11/2011    Cath: MV Dz -- prox LAD with D1&2, Large OM (both branches) & diffuse RCA disease --> CABG x 4  . 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 Ischemic cardiomyopathy 11/2011    Intra-OP TEE: EF 40-45%, no regional WMA; improved Anterior WM post CABG.  . DM (diabetes mellitus), type 2 with renal complications ([place])   . Benign essential HTN     right shoulder  . Hyperlipidemia LDL goal <70   . CKD (chronic kidney disease), stage III   . BPH (benign prostatic hyperplasia)   . Cyst of bursa     R shoulder  . Chronic back pain   . GERD (gastroesophageal reflux disease)     Past Surgical History  Procedure Laterality Date  . Back surgery  1970  . Mass excision  10/24/11    R arm  . Lumbar fusion    . Coronary angioplasty  1994    OM  . Coronary angioplasty  1997    LAD  . Coronary  angioplasty with stent placement  1999    RCA  . Coronary angioplasty with stent placement  2001    RCA  . Incision and drainage of wound  2006    axilla  . Hernia repair    . Coronary artery bypass graft  12/27/2011    Procedure: CORONARY ARTERY BYPASS GRAFTING (CABG);  Surgeon: Rexene Alberts, MD;  Location: [facility];  Service: Open Heart Surgery;  Laterality: N/A;  Times four. On pump. Using endoscopically harvested right greater saphenous vein and left internal mammary artery.   . Intraoperative transesophageal echocardiogram  12/27/2011    Global hypokinesis with EF of 40-45%, improved LAD distribution wall motion.  . Left heart catheterization with coronary angiogram N/A 12/13/2011    Procedure: LEFT HEART CATHETERIZATION WITH CORONARY ANGIOGRAM;  Surgeon: Leonie Man, MD;  Location[facility];  Service:  Cardiovascular;  Laterality: N/A;  . Transesophageal echocardiogram  2013  . Cardiac catheterization  2013    Social History   Social History  . Marital Status: Married    Spouse Name: N/A  . Number of Children: N/A  . Years of Education: N/A   Occupational History  . Not on file.   Social History Main Topics  . Smoking status: Former Smoker    Types: Cigarettes    Quit date: 10/01/1986  . Smokeless tobacco: Never Used     Comment: quit about 30 yrs ago  . Alcohol Use: No  . Drug Use: No  . 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 Not on file   Social History Narrative   Married father of one grandfather 63. Works out at [employer] roughly 2-3 days a week. He works on a treadmill. He does note having a hard time getting his heart rate up.   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   He quit smoking in 1988, and does not drink alcohol.     Filed Vitals:   11/29/15 1119  BP: 150/60  Pulse: 35  Height: 5\' 8"  (1.727 m)  Weight: 163 lb (73.936 kg)    PHYSICAL EXAM General: NAD Neck: No JVD, no thyromegaly or thyroid nodule.  Lungs: Clear to auscultation bilaterally with normal respiratory effort. CV: Nondisplaced PMI. Regular rate (76 bpm) with a rhythm consistent with ventricular bigeminy, normal S1/S2, no S3/S4, no murmur. No pretibial edema bilaterally.   Abdomen: Soft, nontender, no distention.  Skin: Intact without lesions or rashes.  Neurologic: Alert and oriented x 3.  Psych: Normal affect. Extremities: No clubbing or cyanosis.  HEENT: Normal.   ECG: Most recent ECG reviewed.      ASSESSMENT AND PLAN: 1. Bradycardia and fatigue: Normal TSH. Event monitor results reviewed above, with no evidence for symptomatic bradycardia. Not on AV nodal blocking agents. I suspect the automated monitors are not registering PVC's and thus incorrectly  calculating a low HR. Additionally, given his history of snoring, I will arrange for a sleep study to evaluate for sleep apnea, as this is a known cause of bradycardia and arrhythmias.  2. CAD with CABG in 11/2011: Stable ischemic heart disease. Continue ASA and statin.  3. Essential HTN: Mildly elevated on two consecutive visits. Increase doxazosin to 8 mg daily. Also possibly being driven by undiagnosed sleep apnea.  4. Hyperlipidemia: Controlled on pravastatin 20 mg. No changes.  5. Bilateral leg edema/cardiomyopathy/chronic  systolic and diastolic heart failure: Echo results reviewed above. Continue Lasix 20 mg prn and 10 meq KCl.   6. CKD: Referred to nephrology.  7. Snoring and fatigue: Arrange for sleep study.  Dispo: f/u 6 months.  Kate Sable, M.D., F.A.C.C.

## 2015-11-29 NOTE — Patient Instructions (Addendum)
Your physician has recommended that you have a sleep study. This test records several body functions during sleep, including: brain activity, eye movement, oxygen and carbon dioxide blood levels, heart rate and rhythm, breathing rate and rhythm, the flow of air through your mouth and nose, snoring, body muscle movements, and chest and belly movement. Increase Doxazosin to 8mg  daily - new sent to [facility] today.  Continue all other medications.   Your physician wants you to follow up in: 6 months.  You will receive a reminder letter in the mail one-two months in advance.  If you don't receive a letter, please call our office to schedule the follow up appointment

## 2015-11-29 NOTE — Addendum Note (Signed)
Addended by: Laurine Blazer on: 11/29/2015 11:52 AM   Modules accepted: Orders

## 2015-12-06 ENCOUNTER — Other Ambulatory Visit: Payer: Self-pay | Admitting: Family Medicine

## 2015-12-06 MED ORDER — PRAVASTATIN SODIUM 20 MG PO TABS: 20.0000 mg | ORAL_TABLET | Freq: Every day | ORAL | Status: DC

## 2015-12-06 NOTE — Telephone Encounter (Signed)
Medication refilled per protocol. 

## 2016-02-10 ENCOUNTER — Ambulatory Visit (INDEPENDENT_AMBULATORY_CARE_PROVIDER_SITE_OTHER): Payer: Medicare Other | Admitting: Family Medicine

## 2016-02-10 ENCOUNTER — Encounter: Payer: Self-pay | Admitting: Family Medicine

## 2016-02-10 VITALS — BP 140/70 | HR 60 | Temp 97.8°F | Resp 16 | Ht 68.0 in | Wt 161.0 lb

## 2016-02-10 DIAGNOSIS — J342 Deviated nasal septum: Secondary | ICD-10-CM

## 2016-02-10 MED ORDER — SILDENAFIL CITRATE 100 MG PO TABS: 50.0000 mg | ORAL_TABLET | Freq: Every day | ORAL | Status: DC | PRN

## 2016-02-10 MED ORDER — FLUTICASONE PROPIONATE 50 MCG/ACT NA SUSP: 2.0000 | Freq: Every day | NASAL | Status: DC

## 2016-02-10 NOTE — Progress Notes (Signed)
Subjective:    Patient ID: Bradley Black, male    DOB: 03-18-1946, 70 y.o.   MRN: [id number]  HPI Patient presents with left nasal obstruction 2 months. He states that it feels like there is a mass in his left nostril. He denies any injury or contusion to his face. On examination, he does have a leftward deviation of his nasal septum that makes it extremely difficult to even pass a speculum into his nasal cavity. I am not able to see behind the deviation and the nasal turbinate to determine if there may be a nasal polyp. However I believe this is likely what is causing his symptoms Past Medical History  Diagnosis Date  . Acute myocardial infarction, History of: 1988    PTCA of OM; PTCA   . CAD S/P percutaneous coronary angioplasty 1988 - 2001    PTCA of [place]; PCI with BMS to LAD in 1997; RCA PCI BMS 1999 & 200  . CAD in native artery 11/2011    Cath: MV Dz -- prox LAD with D1&2, Large OM (both branches) & diffuse RCA disease --> CABG x 4  . 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 Ischemic cardiomyopathy 11/2011    Intra-OP TEE: EF 40-45%, no regional WMA; improved Anterior WM post CABG.  . DM (diabetes mellitus), type 2 with renal complications ([place])   . Benign essential HTN     right shoulder  . Hyperlipidemia LDL goal <70   . CKD (chronic kidney disease), stage III   . BPH (benign prostatic hyperplasia)   . Cyst of bursa     R shoulder  . Chronic back pain   . GERD (gastroesophageal reflux disease)    Past Surgical History  Procedure Laterality Date  . Back surgery  1970  . Mass excision  10/24/11    R arm  . Lumbar fusion    . Coronary angioplasty  1994    OM  . Coronary angioplasty  1997    LAD  . Coronary angioplasty with stent placement  1999    RCA  . Coronary angioplasty with stent placement  2001    RCA  . Incision and drainage of wound  2006    axilla  . Hernia repair    . Coronary  artery bypass graft  12/27/2011    Procedure: CORONARY ARTERY BYPASS GRAFTING (CABG);  Surgeon: Rexene Alberts, MD;  Location: [facility];  Service: Open Heart Surgery;  Laterality: N/A;  Times four. On pump. Using endoscopically harvested right greater saphenous vein and left internal mammary artery.   . Intraoperative transesophageal echocardiogram  12/27/2011    Global hypokinesis with EF of 40-45%, improved LAD distribution wall motion.  . Left heart catheterization with coronary angiogram N/A 12/13/2011    Procedure: LEFT HEART CATHETERIZATION WITH CORONARY ANGIOGRAM;  Surgeon: Leonie Man, MD;  Location: [facility] CATH LAB;  Service: Cardiovascular;  Laterality: N/A;  . Transesophageal echocardiogram  2013  . Cardiac catheterization  2013   Current Outpatient Prescriptions on File Prior to Visit  Medication Sig Dispense Refill  . aspirin EC 81 MG tablet Take 81 mg by mouth daily.    . AZOR 10-40 MG tablet TAKE 1 TABLET BY MOUTH EVERY DAY 90 tablet 1  . celecoxib (CELEBREX) 200 MG capsule Take 1 capsule (200 mg total) by mouth daily. [place]  capsule 3  . citalopram (CELEXA) 10 MG tablet TAKE ONE (1) TABLET EACH DAY 30 tablet 11  . doxazosin (CARDURA) 8 MG tablet Take 1 tablet (8 mg total) by mouth daily. 30 tablet 6  . JANUVIA 50 MG tablet TAKE 1 TABLET BY MOUTH EVERY DAY 90 tablet 3  . pantoprazole (PROTONIX) 40 MG tablet TAKE 1 TABLET(40 MG) BY MOUTH TWICE DAILY 180 tablet 4  . potassium chloride (K-DUR) 10 MEQ tablet Take 1 tablet (10 mEq total) by mouth as needed (on the days that you have to take your Lasix.). 30 tablet 3  . pravastatin (PRAVACHOL) 20 MG tablet Take 1 tablet (20 mg total) by mouth daily. 90 tablet 1  . sildenafil (VIAGRA) 100 MG tablet Take 0.5-1 tablets (50-100 mg total) by mouth daily as needed for erectile dysfunction. 60 tablet 5   No current facility-administered medications on file prior to visit.   No Known Allergies Social History   Social History  . Marital Status:  Married    Spouse Name: N/A  . Number of Children: N/A  . Years of Education: N/A   Occupational History  . Not on file.   Social History Main Topics  . Smoking status: Former Smoker    Types: Cigarettes    Quit date: 10/01/1986  . Smokeless tobacco: Never Used     Comment: quit about 30 yrs ago  . Alcohol Use: No  . Drug Use: No  . 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 Not on file   Social History Narrative   Married father of one grandfather 58. Works out at [employer] roughly 2-3 days a week. He works on a treadmill. He does note having a hard time getting his heart rate up.   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   He quit smoking in 1988, and does not drink alcohol.   Family History  Problem Relation Age of Onset  . Cancer Mother     breast  . Cancer Father     bone  . [place] cancer Neg Hx      Review of Systems  All other systems reviewed and are negative.      Objective:   Physical Exam  Constitutional: He appears well-developed and well-nourished. No distress.  HENT:  Nose: Septal deviation present. No mucosal edema or rhinorrhea. Right sinus exhibits no maxillary sinus tenderness and no frontal sinus tenderness. Left sinus exhibits no maxillary sinus tenderness and no frontal sinus tenderness.  Eyes: Conjunctivae and EOM are normal. Pupils are equal, round, and reactive to light.  Cardiovascular: Normal heart sounds and intact distal pulses.  A regularly irregular rhythm present. Bradycardia present.   No murmur heard. Pulmonary/Chest: Effort normal and breath sounds normal.  Skin: He is not diaphoretic.  Vitals reviewed.         Assessment & Plan:  Deviated nasal septum - Plan: fluticasone (FLONASE) 50 MCG/ACT nasal spray, Ambulatory referral to ENT  I believe the patient's symptoms are due to a deviated septum although I cannot exclude  a nasal polyp due to my inability to examine behind the deviated septum. Recommended ENT consultation. Meanwhile will try conservative therapy with Flonase 2 sprays each nostril daily.

## 2016-02-16 ENCOUNTER — Other Ambulatory Visit: Payer: Self-pay | Admitting: Family Medicine

## 2016-02-17 NOTE — Telephone Encounter (Signed)
Refill appropriate and filled per protocol. 

## 2016-04-17 ENCOUNTER — Other Ambulatory Visit: Payer: Self-pay | Admitting: Family Medicine

## 2016-06-22 ENCOUNTER — Other Ambulatory Visit: Payer: Self-pay | Admitting: Family Medicine

## 2016-06-22 DIAGNOSIS — H524 Presbyopia: Secondary | ICD-10-CM | POA: Diagnosis not present

## 2016-06-22 DIAGNOSIS — H35033 Hypertensive retinopathy, bilateral: Secondary | ICD-10-CM | POA: Diagnosis not present

## 2016-06-22 LAB — HM DIABETES EYE EXAM

## 2016-07-02 ENCOUNTER — Other Ambulatory Visit: Payer: Self-pay | Admitting: Family Medicine

## 2016-07-10 ENCOUNTER — Encounter (INDEPENDENT_AMBULATORY_CARE_PROVIDER_SITE_OTHER): Payer: Medicare Other | Admitting: Ophthalmology

## 2016-07-10 DIAGNOSIS — E11311 Type 2 diabetes mellitus with unspecified diabetic retinopathy with macular edema: Secondary | ICD-10-CM | POA: Diagnosis not present

## 2016-07-10 DIAGNOSIS — H43813 Vitreous degeneration, bilateral: Secondary | ICD-10-CM | POA: Diagnosis not present

## 2016-07-10 DIAGNOSIS — E113211 Type 2 diabetes mellitus with mild nonproliferative diabetic retinopathy with macular edema, right eye: Secondary | ICD-10-CM | POA: Diagnosis not present

## 2016-07-10 DIAGNOSIS — H35033 Hypertensive retinopathy, bilateral: Secondary | ICD-10-CM | POA: Diagnosis not present

## 2016-07-10 DIAGNOSIS — E113292 Type 2 diabetes mellitus with mild nonproliferative diabetic retinopathy without macular edema, left eye: Secondary | ICD-10-CM | POA: Diagnosis not present

## 2016-07-10 DIAGNOSIS — H2512 Age-related nuclear cataract, left eye: Secondary | ICD-10-CM

## 2016-07-10 DIAGNOSIS — I10 Essential (primary) hypertension: Secondary | ICD-10-CM

## 2016-07-19 ENCOUNTER — Encounter: Payer: Self-pay | Admitting: Family Medicine

## 2016-07-19 DIAGNOSIS — E119 Type 2 diabetes mellitus without complications: Secondary | ICD-10-CM | POA: Insufficient documentation

## 2016-07-19 DIAGNOSIS — E1122 Type 2 diabetes mellitus with diabetic chronic kidney disease: Secondary | ICD-10-CM | POA: Insufficient documentation

## 2016-08-03 ENCOUNTER — Other Ambulatory Visit: Payer: Self-pay | Admitting: Family Medicine

## 2016-08-17 ENCOUNTER — Other Ambulatory Visit: Payer: Self-pay | Admitting: Family Medicine

## 2016-09-14 ENCOUNTER — Ambulatory Visit (INDEPENDENT_AMBULATORY_CARE_PROVIDER_SITE_OTHER): Payer: Medicare Other | Admitting: Family Medicine

## 2016-09-14 VITALS — BP 110/56 | HR 62 | Temp 97.7°F | Resp 16 | Ht 68.0 in | Wt 162.0 lb

## 2016-09-14 DIAGNOSIS — J209 Acute bronchitis, unspecified: Secondary | ICD-10-CM | POA: Diagnosis not present

## 2016-09-14 MED ORDER — AZITHROMYCIN 250 MG PO TABS: ORAL_TABLET | ORAL | 0 refills | Status: DC

## 2016-09-14 MED ORDER — HYDROCOD POLST-CPM POLST ER 10-8 MG/5ML PO SUER: 5.0000 mL | Freq: Two times a day (BID) | ORAL | 0 refills | Status: DC | PRN

## 2016-09-14 NOTE — Progress Notes (Signed)
Subjective:    Patient ID: Bradley Black, male    DOB: [birth year], 70 y.o.   MRN: [id number]  HPI Patient reports a one-week history of cough productive of no sputum. However he has severe chest congestion and shortness of breath and dyspnea on exertion. He also reports subjective fevers and weakness and chills and fatigue. Clinically he does appear ill. On examination he has diminished breath sounds. He also has bilateral rhonchi. Past Medical History:  Diagnosis Date  . Acute myocardial infarction, History of: 1988   PTCA of OM; PTCA   . Benign essential HTN    right shoulder  . BPH (benign prostatic hyperplasia)   . CAD in native artery 11/2011   Cath: MV Dz -- prox LAD with D1&2, Large OM (both branches) & diffuse RCA disease --> CABG x 4  . CAD S/P percutaneous coronary angioplasty 1988 - 2001   PTCA of [place]; PCI with BMS to LAD in 1997; RCA PCI BMS 1999 & 200  . Chronic back pain   . CKD (chronic kidney disease), stage III   . Cyst of bursa    R shoulder  . DM (diabetes mellitus), type 2 with renal complications ([place])   . GERD (gastroesophageal reflux disease)   . Hyperlipidemia LDL goal <70   . Ischemic cardiomyopathy 11/2011   Intra-OP TEE: EF 40-45%, no regional WMA; improved Anterior WM post CABG.  . 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 BACK SURGERY  1970  . CARDIAC CATHETERIZATION  2013  . CORONARY ANGIOPLASTY  1994   OM  . CORONARY ANGIOPLASTY  1997   LAD  . CORONARY ANGIOPLASTY WITH STENT PLACEMENT  1999   RCA  . CORONARY ANGIOPLASTY WITH STENT PLACEMENT  2001   RCA  . CORONARY ARTERY BYPASS GRAFT  12/27/2011   Procedure: CORONARY ARTERY BYPASS GRAFTING (CABG);  Surgeon: Rexene Alberts, MD;  Location: [facility];  Service: Open Heart Surgery;  Laterality: N/A;  Times four. On pump. Using endoscopically harvested right greater saphenous vein  and left internal mammary artery.   [name] HERNIA REPAIR    . INCISION AND DRAINAGE OF WOUND  2006   axilla  . INTRAOPERATIVE TRANSESOPHAGEAL ECHOCARDIOGRAM  12/27/2011   Global hypokinesis with EF of 40-45%, improved LAD distribution wall motion.  [name] LEFT HEART CATHETERIZATION WITH CORONARY ANGIOGRAM N/A 12/13/2011   Procedure: LEFT HEART CATHETERIZATION WITH CORONARY ANGIOGRAM;  Surgeon: Leonie Man, MD;  Location[facility] C[facility] LAB;  Service: Cardiovascular;  Laterality: N/A;  . LUMBAR FUSION    . MASS EXCISION  10/24/11   R arm  . TRANSESOPHAGEAL ECHOCARDIOGRAM  2013   Current Outpatient Prescriptions on File Prior to Visit  Medication Sig Dispense Refill  . amLODipine-olmesartan (AZOR) 10-40 MG tablet TAKE 1 TABLET BY MOUTH EVERY DAY 90 tablet 3  . aspirin EC 81 MG tablet Take 81 mg by mouth daily.    . celecoxib (CELEBREX) 200 MG capsule TAKE 1 CAPSULE(200 MG) BY MOUTH DAILY 90 capsule 2  . citalopram (CELEXA) 10 MG tablet TAKE ONE (1) TABLET EACH DAY 30 tablet 11  . doxazosin (CARDURA) 8 MG tablet Take 1 tablet (8 mg total) by mouth daily. 30 tablet 6  . fluticasone (FLONASE) 50 MCG/ACT nasal spray Place 2 sprays into both nostrils daily. 16 g  6  . JANUVIA 50 MG tablet TAKE 1 TABLET BY MOUTH EVERY DAY 90 tablet 0  . pantoprazole (PROTONIX) 40 MG tablet TAKE 1 TABLET(40 MG) BY MOUTH TWICE DAILY 180 tablet 4  . potassium chloride (K-DUR) 10 MEQ tablet Take 1 tablet (10 mEq total) by mouth as needed (on the days that you have to take your Lasix.). 30 tablet 3  . pravastatin (PRAVACHOL) 20 MG tablet TAKE ONE (1) TABLET BY MOUTH EVERY DAY 90 tablet 1  . sildenafil (VIAGRA) 100 MG tablet Take 0.5-1 tablets (50-100 mg total) by mouth daily as needed for erectile dysfunction. 60 tablet 5   No current facility-administered medications on file prior to visit.    No Known Allergies Social History   Social History  . Marital status: Married    Spouse name: N/A  . Number of children: N/A  . Years  of education: N/A   Occupational History  . Not on file.   Social History Main Topics  . Smoking status: Former Smoker    Types: Cigarettes    Quit date: 10/01/1986  . Smokeless tobacco: Never Used     Comment: quit about 30 yrs ago  . Alcohol use No  . Drug use: No  . 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 Not on file   Social History Narrative   Married father of one grandfather 29. Works out at [employer] roughly 2-3 days a week. He works on a treadmill. He does note having a hard time getting his heart rate up.   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   He quit smoking in 1988, and does not drink alcohol.      Review of Systems  All other systems reviewed and are negative.      Objective:   Physical Exam  Cardiovascular: Normal rate, regular rhythm and normal heart sounds.   Pulmonary/Chest: He has decreased breath sounds. He has no wheezes. He has rhonchi in the right lower field and the left lower field.  Vitals reviewed.         Assessment & Plan:  Acute bronchitis, unspecified organism - Plan: azithromycin (ZITHROMAX) 250 MG tablet, chlorpheniramine-HYDROcodone (TUSSIONEX PENNKINETIC ER) 10-8 MG/5ML SUER  I believe the patient has bronchitis. Begin a Z-Pak. Use Tucks and asked 1 teaspoon every 12 hours as needed for cough. Recheck next week if no better or sooner if worse

## 2016-10-01 ENCOUNTER — Other Ambulatory Visit: Payer: Self-pay | Admitting: Family Medicine

## 2016-11-22 ENCOUNTER — Other Ambulatory Visit: Payer: Self-pay | Admitting: Family Medicine

## 2016-12-24 ENCOUNTER — Other Ambulatory Visit: Payer: Self-pay | Admitting: Family Medicine

## 2017-01-02 ENCOUNTER — Other Ambulatory Visit: Payer: Self-pay | Admitting: Family Medicine

## 2017-01-02 NOTE — Telephone Encounter (Signed)
Medication refill for one time only.  Patient needs to be seen.  Letter sent for patient to call and schedule 

## 2017-01-07 ENCOUNTER — Other Ambulatory Visit: Payer: Self-pay | Admitting: Family Medicine

## 2017-01-07 NOTE — Telephone Encounter (Signed)
Medication refilled per protocol. 

## 2017-01-08 ENCOUNTER — Ambulatory Visit (INDEPENDENT_AMBULATORY_CARE_PROVIDER_SITE_OTHER): Payer: Medicare Other | Admitting: Ophthalmology

## 2017-01-08 DIAGNOSIS — E11311 Type 2 diabetes mellitus with unspecified diabetic retinopathy with macular edema: Secondary | ICD-10-CM | POA: Diagnosis not present

## 2017-01-08 DIAGNOSIS — E113292 Type 2 diabetes mellitus with mild nonproliferative diabetic retinopathy without macular edema, left eye: Secondary | ICD-10-CM

## 2017-01-08 DIAGNOSIS — H35033 Hypertensive retinopathy, bilateral: Secondary | ICD-10-CM | POA: Diagnosis not present

## 2017-01-08 DIAGNOSIS — E113211 Type 2 diabetes mellitus with mild nonproliferative diabetic retinopathy with macular edema, right eye: Secondary | ICD-10-CM | POA: Diagnosis not present

## 2017-01-08 DIAGNOSIS — H43813 Vitreous degeneration, bilateral: Secondary | ICD-10-CM | POA: Diagnosis not present

## 2017-01-08 DIAGNOSIS — H2513 Age-related nuclear cataract, bilateral: Secondary | ICD-10-CM

## 2017-01-08 DIAGNOSIS — H3509 Other intraretinal microvascular abnormalities: Secondary | ICD-10-CM | POA: Diagnosis not present

## 2017-01-08 DIAGNOSIS — I10 Essential (primary) hypertension: Secondary | ICD-10-CM | POA: Diagnosis not present

## 2017-01-08 LAB — HM DIABETES EYE EXAM

## 2017-01-11 ENCOUNTER — Encounter: Payer: Self-pay | Admitting: Family Medicine

## 2017-01-17 ENCOUNTER — Encounter: Payer: Self-pay | Admitting: Family Medicine

## 2017-01-22 ENCOUNTER — Ambulatory Visit (INDEPENDENT_AMBULATORY_CARE_PROVIDER_SITE_OTHER): Payer: Medicare Other | Admitting: Family Medicine

## 2017-01-22 ENCOUNTER — Encounter: Payer: Self-pay | Admitting: Family Medicine

## 2017-01-22 VITALS — BP 150/78 | HR 76 | Temp 97.4°F | Resp 14 | Ht 68.0 in | Wt 159.0 lb

## 2017-01-22 DIAGNOSIS — E118 Type 2 diabetes mellitus with unspecified complications: Secondary | ICD-10-CM

## 2017-01-22 DIAGNOSIS — Z1159 Encounter for screening for other viral diseases: Secondary | ICD-10-CM | POA: Diagnosis not present

## 2017-01-22 DIAGNOSIS — I2510 Atherosclerotic heart disease of native coronary artery without angina pectoris: Secondary | ICD-10-CM | POA: Diagnosis not present

## 2017-01-22 DIAGNOSIS — I10 Essential (primary) hypertension: Secondary | ICD-10-CM | POA: Diagnosis not present

## 2017-01-22 DIAGNOSIS — Z0000 Encounter for general adult medical examination without abnormal findings: Secondary | ICD-10-CM | POA: Diagnosis not present

## 2017-01-22 LAB — CBC WITH DIFFERENTIAL/PLATELET
BASOS ABS: 0 {cells}/uL (ref 0–200)
Basophils Relative: 0 %
Eosinophils Absolute: 201 cells/uL (ref 15–500)
Eosinophils Relative: 3 %
HCT: 36.3 % — ABNORMAL LOW (ref 38.5–50.0)
HEMOGLOBIN: 11.9 g/dL — AB (ref 13.0–17.0)
LYMPHS ABS: 1139 {cells}/uL (ref 850–3900)
Lymphocytes Relative: 17 %
MCH: 31.1 pg (ref 27.0–33.0)
MCHC: 32.8 g/dL (ref 32.0–36.0)
MCV: 94.8 fL (ref 80.0–100.0)
MONO ABS: 469 {cells}/uL (ref 200–950)
MONOS PCT: 7 %
MPV: 12 fL (ref 7.5–12.5)
Neutro Abs: 4891 cells/uL (ref 1500–7800)
Neutrophils Relative %: 73 %
PLATELETS: 165 10*3/uL (ref 140–400)
RBC: 3.83 MIL/uL — ABNORMAL LOW (ref 4.20–5.80)
RDW: 14.3 % (ref 11.0–15.0)
WBC: 6.7 10*3/uL (ref 3.8–10.8)

## 2017-01-22 LAB — COMPLETE METABOLIC PANEL WITH GFR
ALBUMIN: 4.3 g/dL (ref 3.6–5.1)
ALK PHOS: 68 U/L (ref 40–115)
ALT: 11 U/L (ref 9–46)
AST: 15 U/L (ref 10–35)
BILIRUBIN TOTAL: 1.5 mg/dL — AB (ref 0.2–1.2)
BUN: 41 mg/dL — ABNORMAL HIGH (ref 7–25)
CO2: 16 mmol/L — AB (ref 20–31)
CREATININE: 2.28 mg/dL — AB (ref 0.70–1.18)
Calcium: 10.6 mg/dL — ABNORMAL HIGH (ref 8.6–10.3)
Chloride: 108 mmol/L (ref 98–110)
GFR, EST AFRICAN AMERICAN: 32 mL/min — AB (ref >=60)
GFR, Est Non African American: 27 mL/min — ABNORMAL LOW (ref >=60)
GLUCOSE: 117 mg/dL — AB (ref 70–99)
Potassium: 5.8 mmol/L — ABNORMAL HIGH (ref 3.5–5.3)
Sodium: 138 mmol/L (ref 135–146)
TOTAL PROTEIN: 7.2 g/dL (ref 6.1–8.1)

## 2017-01-22 LAB — LIPID PANEL
Cholesterol: 114 mg/dL (ref <200)
HDL: 41 mg/dL (ref >40)
LDL CALC: 60 mg/dL (ref <100)
TRIGLYCERIDES: 65 mg/dL (ref <150)
Total CHOL/HDL Ratio: 2.8 Ratio (ref <5.0)
VLDL: 13 mg/dL (ref <30)

## 2017-01-22 LAB — PSA: PSA: 1 ng/mL

## 2017-01-22 NOTE — Progress Notes (Signed)
Subjective:    Patient ID: Bradley Black, male    DOB: 1945/11/08, 71 y.o.   MRN: [id number]  HPI  Patient is here today for complete physical exam. He denies any complaints today. Patient refuses a colonoscopy because he cannot tolerate the bowel prep.  He is interested in Cologuard.   He is due for psa, dre.  He is due for hep c screen.  Eye exam was done this month and revealed retinopathy.  Due for fasting lab work including psa, hepc, cmp, flp, cbc, HgA1c.  Denies falls, depression, or memory loss.    Past Medical History:  Diagnosis Date  . Acute myocardial infarction, History of: 1988   PTCA of OM; PTCA   . Benign essential HTN    right shoulder  . BPH (benign prostatic hyperplasia)   . CAD in native artery 11/2011   Cath: MV Dz -- prox LAD with D1&2, Large OM (both branches) & diffuse RCA disease --> CABG x 4  . CAD S/P percutaneous coronary angioplasty 1988 - 2001   PTCA of [place]; PCI with BMS to LAD in 1997; RCA PCI BMS 1999 & 200  . Chronic back pain   . CKD (chronic kidney disease), stage III   . Cyst of bursa    R shoulder  . Diabetic retinopathy ([place])   . DM (diabetes mellitus), type 2 with renal complications ([place])   . GERD (gastroesophageal reflux disease)   . Hyperlipidemia LDL goal <70   . Ischemic cardiomyopathy 11/2011   Intra-OP TEE: EF 40-45%, no regional WMA; improved Anterior WM post CABG.  . 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 BACK SURGERY  1970  . CARDIAC CATHETERIZATION  2013  . CORONARY ANGIOPLASTY  1994   OM  . CORONARY ANGIOPLASTY  1997   LAD  . CORONARY ANGIOPLASTY WITH STENT PLACEMENT  1999   RCA  . CORONARY ANGIOPLASTY WITH STENT PLACEMENT  2001   RCA  . CORONARY ARTERY BYPASS GRAFT  12/27/2011   Procedure: CORONARY ARTERY BYPASS GRAFTING (CABG);  Surgeon: Rexene Alberts, MD;  Location: [place] [facility];  Service: Open  Heart Surgery;  Laterality: N/A;  Times four. On pump. Using endoscopically harvested right greater saphenous vein and left internal mammary artery.   [name] HERNIA REPAIR    . INCISION AND DRAINAGE OF WOUND  2006   axilla  . INTRAOPERATIVE TRANSESOPHAGEAL ECHOCARDIOGRAM  12/27/2011   Global hypokinesis with EF of 40-45%, improved LAD distribution wall motion.  [name] LEFT HEART CATHETERIZATION WITH CORONARY ANGIOGRAM N/A 12/13/2011   Procedure: LEFT HEART CATHETERIZATION WITH CORONARY ANGIOGRAM;  Surgeon: Leonie Man, MD;  Location: [facility] CATH LAB;  Service: Cardiovascular;  Laterality: N/A;  . LUMBAR FUSION    . MASS EXCISION  10/24/11   R arm  . TRANSESOPHAGEAL ECHOCARDIOGRAM  2013   Current Outpatient Prescriptions on File Prior to Visit  Medication Sig Dispense Refill  . amLODipine-olmesartan (AZOR) 10-40 MG tablet TAKE 1 TABLET BY MOUTH EVERY DAY 90 tablet 3  . aspirin EC 81 MG tablet Take 81 mg by mouth daily.    . celecoxib (CELEBREX) 200 MG capsule TAKE 1 CAPSULE(200 MG) BY MOUTH DAILY 90 capsule 2  . citalopram (CELEXA) 10 MG tablet TAKE ONE (1) TABLET EACH DAY 30 tablet 11  .  doxazosin (CARDURA) 4 MG tablet TAKE ONE (1) TABLET BY MOUTH EVERY DAY 90 tablet 3  . fluticasone (FLONASE) 50 MCG/ACT nasal spray Place 2 sprays into both nostrils daily. 16 g 6  . JANUVIA 50 MG tablet TAKE 1 TABLET BY MOUTH EVERY DAY 90 tablet 0  . pantoprazole (PROTONIX) 40 MG tablet TAKE ONE TABLET TWICE DAILY 60 tablet 0  . pravastatin (PRAVACHOL) 20 MG tablet TAKE ONE (1) TABLET BY MOUTH EVERY DAY 90 tablet 3  . sildenafil (VIAGRA) 100 MG tablet Take 0.5-1 tablets (50-100 mg total) by mouth daily as needed for erectile dysfunction. 60 tablet 5   No current facility-administered medications on file prior to visit.    No Known Allergies Social History   Social History  . Marital status: Married    Spouse name: N/A  . Number of children: N/A  . Years of education: N/A   Occupational History  . Not on  file.   Social History Main Topics  . Smoking status: Former Smoker    Types: Cigarettes    Quit date: 10/01/1986  . Smokeless tobacco: Never Used     Comment: quit about 30 yrs ago  . Alcohol use No  . Drug use: No  . 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 Not on file   Social History Narrative   Married father of one grandfather 49. Works out at [employer] at [employer] roughly 2-3 days a week. He works on a treadmill. He does note having a hard time getting his heart rate up.   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   He quit smoking in 1988, and does not drink alcohol.   Family History  Problem Relation Age of Onset  . Cancer Mother     breast  . Cancer Father     bone  . [place]avar[place] cancer Neg Hx      Review of Systems  All other systems reviewed and are negative.      Objective:   Physical Exam  Constitutional: He is oriented to person, place, and time. He appears well-developed and well-nourished. No distress.  HENT:  Head: Normocephalic and atraumatic.  Right Ear: External ear normal.  Left Ear: External ear normal.  Nose: Nose normal.  Mouth/Throat: Oropharynx is clear and moist. No oropharyngeal exudate.  Eyes: Conjunctivae and EOM are normal. Pupils are equal, round, and reactive to light. Right eye exhibits no discharge. Left eye exhibits no discharge. No scleral icterus.  Neck: Normal range of motion. Neck supple. No JVD present. No tracheal deviation present. No thyromegaly present.  Cardiovascular: Normal rate, regular rhythm, normal heart sounds and intact distal pulses.  Exam reveals no gallop and no friction rub.   No murmur heard. Pulmonary/Chest: Effort normal and breath sounds normal. No stridor. No respiratory distress. He has no wheezes. He has no rales. He exhibits no tenderness.  Abdominal: Soft. Bowel sounds are normal. He exhibits no distension and no  mass. There is no tenderness. There is no rebound and no guarding.  Genitourinary: Rectum normal, prostate normal and penis normal.  Musculoskeletal: Normal range of motion. He exhibits no edema or tenderness.  Lymphadenopathy:    He has no cervical adenopathy.  Neurological: He is alert and oriented to person, place, and time. He has normal reflexes. No cranial nerve deficit. Coordination normal.  Skin: Skin is warm.  No rash noted. He is not diaphoretic. No erythema. No pallor.  Psychiatric: He has a normal mood and affect. His behavior is normal. Judgment and thought content normal.  Vitals reviewed.         Assessment & Plan:  Controlled type 2 diabetes mellitus with complication, without long-term current use of insulin ([place]) - Plan: CBC with Differential/Platelet, COMPLETE METABOLIC PANEL WITH GFR, Lipid panel, Microalbumin, urine, Hemoglobin A1c  General medical exam - Plan: PSA  Encounter for hepatitis C screening test for low risk patient - Plan: Hepatitis C Ab Reflex HCV RNA, QUANT  ASCVD (arteriosclerotic cardiovascular disease)  Benign essential HTN  Exam is normal.  Blood pressure today is elevated however the patient checks his blood pressure at home and states that his systolic blood pressure typically ranges in the 130s over 80s. Immunizations are up-to-date: Immunization History  Administered Date(s) Administered  . Influenza, High Dose Seasonal PF 08/10/2016  . Influenza,inj,Quad PF,36+ Mos 07/12/2015  . Pneumococcal Conjugate-13 04/19/2015  . Pneumococcal Polysaccharide-23 02/12/2013  . Tdap 10/01/2001  . Zoster 03/04/2007   Prostate exam is unremarkable. There is a small 2 mm nodular area on the right side of prostate gland. I will check a PSA and monitor this area. I'll screen the patient for hepatitis C. Check a CMP and a fasting lipid panel. Goal LDL cholesterol is less than 70 given his history of four-vessel CABG. Check a hemoglobin A1c. Goal hemoglobin A1c  is less than 7. Diabetic eye exam and foot exam is up-to-date. Schedule patient for cologuard.

## 2017-01-23 LAB — HEMOGLOBIN A1C
HEMOGLOBIN A1C: 5.6 % (ref <5.7)
MEAN PLASMA GLUCOSE: 114 mg/dL

## 2017-01-23 LAB — HEPATITIS C ANTIBODY: HCV Ab: NEGATIVE

## 2017-01-23 LAB — MICROALBUMIN, URINE: MICROALB UR: 27.7 mg/dL

## 2017-02-01 ENCOUNTER — Other Ambulatory Visit: Payer: Self-pay | Admitting: Family Medicine

## 2017-02-01 MED ORDER — TRAMADOL HCL 50 MG PO TABS: 50.0000 mg | ORAL_TABLET | Freq: Three times a day (TID) | ORAL | 0 refills | Status: DC | PRN

## 2017-02-04 ENCOUNTER — Other Ambulatory Visit: Payer: Self-pay | Admitting: Family Medicine

## 2017-02-18 ENCOUNTER — Other Ambulatory Visit: Payer: Medicare Other

## 2017-02-18 DIAGNOSIS — N289 Disorder of kidney and ureter, unspecified: Secondary | ICD-10-CM | POA: Diagnosis not present

## 2017-02-19 ENCOUNTER — Other Ambulatory Visit: Payer: Self-pay | Admitting: Family Medicine

## 2017-02-19 DIAGNOSIS — N289 Disorder of kidney and ureter, unspecified: Secondary | ICD-10-CM

## 2017-02-19 LAB — COMPLETE METABOLIC PANEL WITH GFR
ALBUMIN: 4 g/dL (ref 3.6–5.1)
ALK PHOS: 64 U/L (ref 40–115)
ALT: 11 U/L (ref 9–46)
AST: 11 U/L (ref 10–35)
BUN: 41 mg/dL — AB (ref 7–25)
CO2: 18 mmol/L — ABNORMAL LOW (ref 20–31)
Calcium: 9.8 mg/dL (ref 8.6–10.3)
Chloride: 115 mmol/L — ABNORMAL HIGH (ref 98–110)
Creat: 2.24 mg/dL — ABNORMAL HIGH (ref 0.70–1.18)
GFR, Est African American: 32 mL/min — ABNORMAL LOW (ref >=60)
GFR, Est Non African American: 28 mL/min — ABNORMAL LOW (ref >=60)
GLUCOSE: 101 mg/dL — AB (ref 70–99)
POTASSIUM: 5.4 mmol/L — AB (ref 3.5–5.3)
SODIUM: 141 mmol/L (ref 135–146)
Total Bilirubin: 0.7 mg/dL (ref 0.2–1.2)
Total Protein: 6.6 g/dL (ref 6.1–8.1)

## 2017-02-21 DIAGNOSIS — E872 Acidosis: Secondary | ICD-10-CM | POA: Diagnosis not present

## 2017-02-21 DIAGNOSIS — N184 Chronic kidney disease, stage 4 (severe): Secondary | ICD-10-CM | POA: Diagnosis not present

## 2017-02-21 DIAGNOSIS — R809 Proteinuria, unspecified: Secondary | ICD-10-CM | POA: Diagnosis not present

## 2017-02-21 DIAGNOSIS — D638 Anemia in other chronic diseases classified elsewhere: Secondary | ICD-10-CM | POA: Diagnosis not present

## 2017-02-21 DIAGNOSIS — I10 Essential (primary) hypertension: Secondary | ICD-10-CM | POA: Diagnosis not present

## 2017-02-28 ENCOUNTER — Telehealth: Payer: Self-pay | Admitting: *Deleted

## 2017-02-28 NOTE — Telephone Encounter (Signed)
This encounter was created in error - please disregard.

## 2017-02-28 NOTE — Telephone Encounter (Signed)
Regarding referral to [facility]: Dr. Moshe Cipro recommends that patient stop taking Celebrex until appointment with them.

## 2017-02-28 NOTE — Addendum Note (Signed)
Addended by: Shary Decamp B on: 02/28/2017 12:40 PM   Modules accepted: Level of Service, SmartSet

## 2017-03-01 ENCOUNTER — Other Ambulatory Visit (HOSPITAL_COMMUNITY): Payer: Self-pay | Admitting: Nephrology

## 2017-03-01 DIAGNOSIS — N183 Chronic kidney disease, stage 3 unspecified: Secondary | ICD-10-CM

## 2017-03-14 ENCOUNTER — Ambulatory Visit (HOSPITAL_COMMUNITY)
Admission: RE | Admit: 2017-03-14 | Discharge: 2017-03-14 | Disposition: A | Discharge status: Home / Self Care | Payer: Medicare Other | Source: Ambulatory Visit | Attending: Nephrology | Admitting: Nephrology

## 2017-03-14 DIAGNOSIS — R809 Proteinuria, unspecified: Secondary | ICD-10-CM | POA: Diagnosis not present

## 2017-03-14 DIAGNOSIS — N281 Cyst of kidney, acquired: Secondary | ICD-10-CM | POA: Diagnosis not present

## 2017-03-14 DIAGNOSIS — D519 Vitamin B12 deficiency anemia, unspecified: Secondary | ICD-10-CM | POA: Diagnosis not present

## 2017-03-14 DIAGNOSIS — N183 Chronic kidney disease, stage 3 unspecified: Secondary | ICD-10-CM

## 2017-03-14 DIAGNOSIS — N261 Atrophy of kidney (terminal): Secondary | ICD-10-CM | POA: Insufficient documentation

## 2017-03-14 DIAGNOSIS — N200 Calculus of kidney: Secondary | ICD-10-CM | POA: Diagnosis not present

## 2017-03-14 DIAGNOSIS — E559 Vitamin D deficiency, unspecified: Secondary | ICD-10-CM | POA: Diagnosis not present

## 2017-03-14 DIAGNOSIS — Z1159 Encounter for screening for other viral diseases: Secondary | ICD-10-CM | POA: Diagnosis not present

## 2017-03-14 DIAGNOSIS — D509 Iron deficiency anemia, unspecified: Secondary | ICD-10-CM | POA: Diagnosis not present

## 2017-04-04 DIAGNOSIS — E875 Hyperkalemia: Secondary | ICD-10-CM | POA: Diagnosis not present

## 2017-04-08 ENCOUNTER — Other Ambulatory Visit: Payer: Self-pay | Admitting: Family Medicine

## 2017-04-10 DIAGNOSIS — E875 Hyperkalemia: Secondary | ICD-10-CM | POA: Diagnosis not present

## 2017-04-10 DIAGNOSIS — N184 Chronic kidney disease, stage 4 (severe): Secondary | ICD-10-CM | POA: Diagnosis not present

## 2017-04-10 DIAGNOSIS — D638 Anemia in other chronic diseases classified elsewhere: Secondary | ICD-10-CM | POA: Diagnosis not present

## 2017-04-10 DIAGNOSIS — E872 Acidosis: Secondary | ICD-10-CM | POA: Diagnosis not present

## 2017-04-10 DIAGNOSIS — R809 Proteinuria, unspecified: Secondary | ICD-10-CM | POA: Diagnosis not present

## 2017-04-12 ENCOUNTER — Other Ambulatory Visit: Payer: Self-pay | Admitting: Family Medicine

## 2017-04-17 DIAGNOSIS — N183 Chronic kidney disease, stage 3 (moderate): Secondary | ICD-10-CM | POA: Diagnosis not present

## 2017-04-17 DIAGNOSIS — Z79899 Other long term (current) drug therapy: Secondary | ICD-10-CM | POA: Diagnosis not present

## 2017-04-29 ENCOUNTER — Ambulatory Visit (INDEPENDENT_AMBULATORY_CARE_PROVIDER_SITE_OTHER): Payer: Medicare Other | Admitting: Family Medicine

## 2017-04-29 ENCOUNTER — Other Ambulatory Visit: Payer: Self-pay | Admitting: Family Medicine

## 2017-04-29 ENCOUNTER — Encounter: Payer: Self-pay | Admitting: Family Medicine

## 2017-04-29 VITALS — BP 136/68 | HR 56 | Temp 98.2°F | Resp 14 | Ht 68.0 in | Wt 157.0 lb

## 2017-04-29 DIAGNOSIS — M546 Pain in thoracic spine: Secondary | ICD-10-CM

## 2017-04-29 DIAGNOSIS — G8929 Other chronic pain: Secondary | ICD-10-CM | POA: Diagnosis not present

## 2017-04-29 MED ORDER — SILDENAFIL CITRATE 100 MG PO TABS: 50.0000 mg | ORAL_TABLET | Freq: Every day | ORAL | 5 refills | Status: DC | PRN

## 2017-04-29 MED ORDER — HYDROCODONE-ACETAMINOPHEN 5-325 MG PO TABS: 1.0000 | ORAL_TABLET | Freq: Four times a day (QID) | ORAL | 0 refills | Status: DC | PRN

## 2017-04-29 MED ORDER — TIZANIDINE HCL 4 MG PO TABS: 4.0000 mg | ORAL_TABLET | Freq: Four times a day (QID) | ORAL | 0 refills | Status: DC | PRN

## 2017-04-29 NOTE — Progress Notes (Signed)
Subjective:    Patient ID: Bradley Black, male    DOB: [birth year], 71 y.o.   MRN: [id number]  HPI MRI T spine 1. Slight degenerative facet arthritis throughout the thoracic spine, most prominent at T2-3 bilaterally. 2. Small central disc protrusion at T8-9 indenting the ventral aspect of the spinal cord without myelopathy.  MRI C spine 1. At C5-6 there is degenerative disc disease with disc height loss and a moderate broad-based disc osteophyte complex abutting the ventral cervical spinal cord. Bilateral uncovertebral degenerative changes with moderate bilateral foraminal stenosis.   Patient has been dealing with back pain for years. Above I have copied the reports from MRIs from 2015 when he initially presented with the pain. He continues to report pain in between his shoulder blades which radiates down to his mid back around the level of T10 and up into his neck. He has seen orthopedics who did not recommend cortisone injections in the neck. Previously he was managing the pain with NSAIDs and tramadol. However, we had to discontinue the NSAIDs due to chronic kidney disease with GFR approximately 30 mL/min.  Tramadol was ineffective. He is here today to discuss options for pain management. Tylenol is ineffective. The pain is worse the longer he stands. If he lays down for 10 minutes the pain will go away. Much of this sounds muscular. He tried physical therapy in the past without benefit. He has not tried any muscle relaxers. He has not tried any narcotics. Past Medical History:  Diagnosis Date  . Acute myocardial infarction, History of: 1988   PTCA of OM; PTCA   . Benign essential HTN    right shoulder  . BPH (benign prostatic hyperplasia)   . CAD in native artery 11/2011   Cath: MV Dz -- prox LAD with D1&2, Large OM (both branches) & diffuse RCA disease --> CABG x 4  . CAD S/P percutaneous coronary angioplasty 1988 - 2001   PTCA of [place]; PCI with BMS to LAD in 1997; RCA PCI  BMS 1999 & 200  . Chronic back pain   . CKD (chronic kidney disease), stage III   . Cyst of bursa    R shoulder  . Diabetic retinopathy ([place])   . DM (diabetes mellitus), type 2 with renal complications ([place])   . GERD (gastroesophageal reflux disease)   . Hyperlipidemia LDL goal <70   . Ischemic cardiomyopathy 11/2011   Intra-OP TEE: EF 40-45%, no regional WMA; improved Anterior WM post CABG.  . 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 BACK SURGERY  1970  . CARDIAC CATHETERIZATION  2013  . CORONARY ANGIOPLASTY  1994   OM  . CORONARY ANGIOPLASTY  1997   LAD  . CORONARY ANGIOPLASTY WITH STENT PLACEMENT  1999   RCA  . CORONARY ANGIOPLASTY WITH STENT PLACEMENT  2001   RCA  . CORONARY ARTERY BYPASS GRAFT  12/27/2011   Procedure: CORONARY ARTERY BYPASS GRAFTING (CABG);  Surgeon: Rexene Alberts, MD;  Location: [place];  Service: Open Heart Surgery;  Laterality: N/A;  Times four. On pump. Using endoscopically harvested right greater saphenous vein and left internal mammary artery.   [name] HERNIA REPAIR    . INCISION AND DRAINAGE OF WOUND  2006   axilla  . INTRAOPERATIVE TRANSESOPHAGEAL ECHOCARDIOGRAM  12/27/2011   Global  hypokinesis with EF of 40-45%, improved LAD distribution wall motion.  [name] LEFT HEART CATHETERIZATION WITH CORONARY ANGIOGRAM N/A 12/13/2011   Procedure: LEFT HEART CATHETERIZATION WITH CORONARY ANGIOGRAM;  Surgeon: Leonie Man, MD;  [facility] CATH LAB;  Service: Cardiovascular;  Laterality: N/A;  . LUMBAR FUSION    . MASS EXCISION  10/24/11   R arm  . TRANSESOPHAGEAL ECHOCARDIOGRAM  2013   Current Outpatient Prescriptions on File Prior to Visit  Medication Sig Dispense Refill  . aspirin EC 81 MG tablet Take 81 mg by mouth daily.    . citalopram (CELEXA) 10 MG tablet TAKE ONE (1) TABLET EACH DAY 30 tablet 11  . doxazosin (CARDURA) 4 MG tablet TAKE ONE (1)  TABLET BY MOUTH EVERY DAY 90 tablet 3  . fluticasone (FLONASE) 50 MCG/ACT nasal spray Place 2 sprays into both nostrils daily. 16 g 6  . JANUVIA 50 MG tablet TAKE 1 TABLET BY MOUTH EVERY DAY 90 tablet 0  . pantoprazole (PROTONIX) 40 MG tablet TAKE ONE TABLET TWICE DAILY 180 tablet 3  . pravastatin (PRAVACHOL) 20 MG tablet TAKE ONE (1) TABLET BY MOUTH EVERY DAY 90 tablet 3  . celecoxib (CELEBREX) 200 MG capsule TAKE 1 CAPSULE(200 MG) BY MOUTH DAILY (Patient not taking: Reported on 04/29/2017) 90 capsule 2  . traMADol (ULTRAM) 50 MG tablet Take 1 tablet (50 mg total) by mouth every 8 (eight) hours as needed. (Patient not taking: Reported on 04/29/2017) 60 tablet 0   No current facility-administered medications on file prior to visit.    No Known Allergies Social History   Social History  . Marital status: Married    Spouse name: N/A  . Number of children: N/A  . Years of education: N/A   Occupational History  . Not on file.   Social History Main Topics  . Smoking status: Former Smoker    Types: Cigarettes    Quit date: 10/01/1986  . Smokeless tobacco: Never Used     Comment: quit about 30 yrs ago  . Alcohol use No  . Drug use: No  . 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 Not on file   Social History Narrative   Married father of one grandfather 23. Works out at [employer] at [employer] roughly 2-3 days a week. He works on a treadmill. He does note having a hard time getting his heart rate up.   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   He quit smoking in 1988, and does not drink alcohol.      Review of Systems  All other systems reviewed and are negative.      Objective:   Physical Exam  Cardiovascular: Normal rate, regular rhythm and normal heart sounds.   Pulmonary/Chest: Effort normal and breath sounds normal. No respiratory distress. He has no wheezes. He has no rales.    Musculoskeletal:       Cervical back: He exhibits decreased range of motion and pain. He exhibits no tenderness, no bony tenderness and no spasm.       Thoracic back: He exhibits pain. He exhibits normal range of motion, no tenderness, no swelling, no edema, no deformity, no laceration and no spasm.       Back:  Vitals reviewed.         Assessment & Plan:  Chronic midline thoracic back pain  I would  like to try to manage the patient's back pain primarily using muscle relaxers with Zanaflex 4 mg 3 times a day as needed. He continues hydrocodone/acetaminophen 5/325 one by mouth 6 hours when necessary for breakthrough. Hopefully using the muscle relaxers 3 times a day to prevent him from requiring use of narcotic and 30 tablets of hydrocodone will hopefully last for more than a month. Recheck in one month or sooner if worsening

## 2017-04-29 NOTE — Telephone Encounter (Signed)
Prescription sent to pharmacy.

## 2017-04-29 NOTE — Telephone Encounter (Signed)
ok 

## 2017-04-29 NOTE — Telephone Encounter (Signed)
Ok to refill 

## 2017-06-13 DIAGNOSIS — M65331 Trigger finger, right middle finger: Secondary | ICD-10-CM | POA: Diagnosis not present

## 2017-06-13 DIAGNOSIS — D509 Iron deficiency anemia, unspecified: Secondary | ICD-10-CM | POA: Diagnosis not present

## 2017-06-13 DIAGNOSIS — I10 Essential (primary) hypertension: Secondary | ICD-10-CM | POA: Diagnosis not present

## 2017-06-13 DIAGNOSIS — Z79899 Other long term (current) drug therapy: Secondary | ICD-10-CM | POA: Diagnosis not present

## 2017-06-13 DIAGNOSIS — M65341 Trigger finger, right ring finger: Secondary | ICD-10-CM | POA: Diagnosis not present

## 2017-06-13 DIAGNOSIS — N183 Chronic kidney disease, stage 3 (moderate): Secondary | ICD-10-CM | POA: Diagnosis not present

## 2017-06-13 DIAGNOSIS — R809 Proteinuria, unspecified: Secondary | ICD-10-CM | POA: Diagnosis not present

## 2017-06-19 DIAGNOSIS — E872 Acidosis: Secondary | ICD-10-CM | POA: Diagnosis not present

## 2017-06-19 DIAGNOSIS — N184 Chronic kidney disease, stage 4 (severe): Secondary | ICD-10-CM | POA: Diagnosis not present

## 2017-06-19 DIAGNOSIS — N250 Renal osteodystrophy: Secondary | ICD-10-CM | POA: Diagnosis not present

## 2017-06-19 DIAGNOSIS — R809 Proteinuria, unspecified: Secondary | ICD-10-CM | POA: Diagnosis not present

## 2017-07-11 DIAGNOSIS — M65342 Trigger finger, left ring finger: Secondary | ICD-10-CM | POA: Diagnosis not present

## 2017-07-11 DIAGNOSIS — M65332 Trigger finger, left middle finger: Secondary | ICD-10-CM | POA: Diagnosis not present

## 2017-07-16 ENCOUNTER — Other Ambulatory Visit: Payer: Self-pay | Admitting: Family Medicine

## 2017-07-16 ENCOUNTER — Ambulatory Visit (INDEPENDENT_AMBULATORY_CARE_PROVIDER_SITE_OTHER): Payer: Medicare Other | Admitting: Ophthalmology

## 2017-08-14 DIAGNOSIS — I10 Essential (primary) hypertension: Secondary | ICD-10-CM | POA: Diagnosis not present

## 2017-08-14 DIAGNOSIS — H524 Presbyopia: Secondary | ICD-10-CM | POA: Diagnosis not present

## 2017-08-14 DIAGNOSIS — E113293 Type 2 diabetes mellitus with mild nonproliferative diabetic retinopathy without macular edema, bilateral: Secondary | ICD-10-CM | POA: Diagnosis not present

## 2017-08-16 ENCOUNTER — Other Ambulatory Visit: Payer: Self-pay | Admitting: Family Medicine

## 2017-08-26 DIAGNOSIS — I10 Essential (primary) hypertension: Secondary | ICD-10-CM | POA: Diagnosis not present

## 2017-08-26 DIAGNOSIS — Z79899 Other long term (current) drug therapy: Secondary | ICD-10-CM | POA: Diagnosis not present

## 2017-08-26 DIAGNOSIS — N183 Chronic kidney disease, stage 3 (moderate): Secondary | ICD-10-CM | POA: Diagnosis not present

## 2017-08-26 DIAGNOSIS — E559 Vitamin D deficiency, unspecified: Secondary | ICD-10-CM | POA: Diagnosis not present

## 2017-08-26 DIAGNOSIS — D509 Iron deficiency anemia, unspecified: Secondary | ICD-10-CM | POA: Diagnosis not present

## 2017-08-28 DIAGNOSIS — E872 Acidosis: Secondary | ICD-10-CM | POA: Diagnosis not present

## 2017-08-28 DIAGNOSIS — N184 Chronic kidney disease, stage 4 (severe): Secondary | ICD-10-CM | POA: Diagnosis not present

## 2017-08-28 DIAGNOSIS — R809 Proteinuria, unspecified: Secondary | ICD-10-CM | POA: Diagnosis not present

## 2017-08-28 DIAGNOSIS — I10 Essential (primary) hypertension: Secondary | ICD-10-CM | POA: Diagnosis not present

## 2017-08-28 DIAGNOSIS — E1129 Type 2 diabetes mellitus with other diabetic kidney complication: Secondary | ICD-10-CM | POA: Diagnosis not present

## 2017-10-17 ENCOUNTER — Other Ambulatory Visit: Payer: Self-pay | Admitting: Family Medicine

## 2017-11-26 ENCOUNTER — Other Ambulatory Visit: Payer: Self-pay | Admitting: Family Medicine

## 2017-12-12 DIAGNOSIS — M65331 Trigger finger, right middle finger: Secondary | ICD-10-CM | POA: Diagnosis not present

## 2017-12-12 DIAGNOSIS — M65341 Trigger finger, right ring finger: Secondary | ICD-10-CM | POA: Diagnosis not present

## 2017-12-23 ENCOUNTER — Ambulatory Visit: Payer: Self-pay | Admitting: Orthopedic Surgery

## 2017-12-27 ENCOUNTER — Other Ambulatory Visit: Payer: Self-pay | Admitting: Family Medicine

## 2018-01-06 DIAGNOSIS — R809 Proteinuria, unspecified: Secondary | ICD-10-CM | POA: Diagnosis not present

## 2018-01-06 DIAGNOSIS — D509 Iron deficiency anemia, unspecified: Secondary | ICD-10-CM | POA: Diagnosis not present

## 2018-01-06 DIAGNOSIS — I10 Essential (primary) hypertension: Secondary | ICD-10-CM | POA: Diagnosis not present

## 2018-01-06 DIAGNOSIS — N183 Chronic kidney disease, stage 3 (moderate): Secondary | ICD-10-CM | POA: Diagnosis not present

## 2018-01-06 DIAGNOSIS — Z79899 Other long term (current) drug therapy: Secondary | ICD-10-CM | POA: Diagnosis not present

## 2018-01-06 DIAGNOSIS — E559 Vitamin D deficiency, unspecified: Secondary | ICD-10-CM | POA: Diagnosis not present

## 2018-01-08 DIAGNOSIS — E872 Acidosis: Secondary | ICD-10-CM | POA: Diagnosis not present

## 2018-01-08 DIAGNOSIS — I10 Essential (primary) hypertension: Secondary | ICD-10-CM | POA: Diagnosis not present

## 2018-01-08 DIAGNOSIS — E1129 Type 2 diabetes mellitus with other diabetic kidney complication: Secondary | ICD-10-CM | POA: Diagnosis not present

## 2018-01-08 DIAGNOSIS — R809 Proteinuria, unspecified: Secondary | ICD-10-CM | POA: Diagnosis not present

## 2018-01-08 DIAGNOSIS — N184 Chronic kidney disease, stage 4 (severe): Secondary | ICD-10-CM | POA: Diagnosis not present

## 2018-01-09 DIAGNOSIS — M65331 Trigger finger, right middle finger: Secondary | ICD-10-CM | POA: Diagnosis not present

## 2018-01-09 DIAGNOSIS — M65341 Trigger finger, right ring finger: Secondary | ICD-10-CM | POA: Diagnosis not present

## 2018-01-23 ENCOUNTER — Encounter: Payer: Self-pay | Admitting: Student

## 2018-01-23 NOTE — Progress Notes (Signed)
Cardiology Office Note    Date:  01/24/2018   ID:  Newell Stephone, Gum [birth year], MRN [id number]  PCP:  Susy Frizzle, MD  Cardiologist: Kate Sable, MD    Chief Complaint  Patient presents with  . Follow-up    overdue visit; need for cardiac clearance    History of Present Illness:    Bradley Black is a 72 y.o. male with past medical history of CAD (multiple prior interventions with subsequent CABG in 11/2011 with LIMA-LAD, SVG-PDA, SVG-OM2, and SVG-D1), chronic combined systolic and diastolic CHF (EF 75% by echocardiogram in 11/2015), baseline bradycardia, HTN, HLD, and Type 2 DM who presents to the office today for preoperative cardiac clearance for hand surgery (pulley release).   The patient was last examined by Dr. Bronson Ing in 11/2015 and reported consistent fatigue over the past 6 months but denied any associated chest pain or dyspnea on exertion. Recent event monitor have been obtained and showed normal sinus rhythm with occasional PVCs and no significant bradycardia noted. It was recommended he have an outpatient sleep study to evaluate for OSA but I cannot see where this was obtained.  In talking with the patient today, he reports overall doing well since his last office visit. 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 He denies any chest discomfort or dyspnea on exertion when performing these activities. He denies any recent orthopnea, PND, lower extremity edema, palpitations, lightheadedness, dizziness, or presyncope.  He does not check his blood pressure regularly at home but reports good compliance with his medication regimen. BP was initially elevated at 164/61 during today's visit, improved to 142/92 on recheck.  Past Medical History:  Diagnosis Date  . Acute myocardial  infarction, History of: 1988   PTCA of OM; PTCA   . Benign essential HTN    right shoulder  . BPH (benign prostatic hyperplasia)   . CAD S/P percutaneous coronary angioplasty    a. PTCA of [place] b. PCI with BMS to LAD in 1997 c. RCA PCI [place] d. s/p CABG in 11/2011 with LIMA-LAD, SVG-PDA, SVG-OM2, and SVG-D1  . Chronic back pain   . CKD (chronic kidney disease), stage III ([place])   . Cyst of bursa    R shoulder  . Diabetic retinopathy ([place])   . DM (diabetes mellitus), type 2 with renal complications ([place])   . GERD (gastroesophageal reflux disease)   . Hyperlipidemia LDL goal <70   . Ischemic cardiomyopathy 11/2011   Intra-OP TEE: EF 40-45%, no regional WMA; improved Anterior WM post CABG.  . 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 BACK SURGERY  1970  . CARDIAC CATHETERIZATION  2013  . CORONARY ANGIOPLASTY  1994   OM  . CORONARY ANGIOPLASTY  1997   LAD  . CORONARY ANGIOPLASTY WITH STENT PLACEMENT  1999   RCA  . CORONARY ANGIOPLASTY WITH STENT PLACEMENT  2001   RCA  . CORONARY ARTERY BYPASS GRAFT  12/27/2011   Procedure: CORONARY ARTERY BYPASS GRAFTING (CABG);  Surgeon: Rexene Alberts, MD;  Location: [facility];  Service: Open Heart Surgery;  Laterality:  N/A;  Times four. On pump. Using endoscopically harvested right greater saphenous vein and left internal mammary artery.   [name] HERNIA REPAIR    . INCISION AND DRAINAGE OF WOUND  2006   axilla  . INTRAOPERATIVE TRANSESOPHAGEAL ECHOCARDIOGRAM  12/27/2011   Global hypokinesis with EF of 40-45%, improved LAD distribution wall motion.  [name] LEFT HEART CATHETERIZATION WITH CORONARY ANGIOGRAM N/A 12/13/2011   Procedure: LEFT HEART CATHETERIZATION WITH CORONARY ANGIOGRAM;  Surgeon: Leonie Man, MD;  Location: [facility] CATH LAB;  Service: Cardiovascular;  Laterality: N/A;  . LUMBAR FUSION    . MASS EXCISION  10/24/11     R arm  . TRANSESOPHAGEAL ECHOCARDIOGRAM  2013    Current Medications: Outpatient Medications Prior to Visit  Medication Sig Dispense Refill  . amLODipine-olmesartan (AZOR) 5-20 MG tablet     . aspirin EC 81 MG tablet Take 81 mg by mouth daily.    . citalopram (CELEXA) 10 MG tablet TAKE ONE (1) TABLET EACH DAY 90 tablet 3  . doxazosin (CARDURA) 4 MG tablet TAKE ONE (1) TABLET BY MOUTH EVERY DAY 90 tablet 3  . JANUVIA 50 MG tablet TAKE 1 TABLET BY MOUTH EVERY DAY 90 tablet 0  . pantoprazole (PROTONIX) 40 MG tablet TAKE ONE TABLET TWICE DAILY 180 tablet 3  . pravastatin (PRAVACHOL) 20 MG tablet TAKE ONE (1) TABLET BY MOUTH EVERY DAY 90 tablet 3  . sildenafil (VIAGRA) 100 MG tablet Take 0.5-1 tablets (50-100 mg total) by mouth daily as needed for erectile dysfunction. 60 tablet 5  . sodium bicarbonate 650 MG tablet Take 650 mg by mouth 3 (three) times daily.    . celecoxib (CELEBREX) 200 MG capsule TAKE 1 CAPSULE(200 MG) BY MOUTH DAILY (Patient not taking: Reported on 04/29/2017) 90 capsule 2  . fluticasone (FLONASE) 50 MCG/ACT nasal spray Place 2 sprays into both nostrils daily. 16 g 6  . furosemide (LASIX) 20 MG tablet Take 20 mg by mouth every other day.     [name] HYDROcodone-acetaminophen (NORCO) 5-325 MG tablet Take 1 tablet by mouth every 6 (six) hours as needed for moderate pain. 20 tablet 0  . tiZANidine (ZANAFLEX) 4 MG tablet TAKE 1 TABLET(4 MG) BY MOUTH EVERY 6 HOURS AS NEEDED FOR MUSCLE SPASMS 360 tablet 0  . traMADol (ULTRAM) 50 MG tablet Take 1 tablet (50 mg total) by mouth every 8 (eight) hours as needed. (Patient not taking: Reported on 04/29/2017) 60 tablet 0   No facility-administered medications prior to visit.      Allergies:   Patient has no known allergies.   Social History   Socioeconomic History  . Marital status: Married    Spouse name: Not on file  . Number of children: Not on file  . Years of education: Not on file  . Highest education level: Not on file   Occupational History  . Not on file  Social Needs  . Financial resource strain: Not on file  . Food insecurity:    Worry: Not on file    Inability: Not on file  . Transportation needs:    Medical: Not on file    Non-medical: Not on file  Tobacco Use  . Smoking status: Former Smoker    Types: Cigarettes    Last attempt to quit: 10/01/1986    Years since quitting: 31.3  . Smokeless tobacco: Never Used  . Tobacco comment: quit about 30 yrs ago  Substance and Sexual Activity  . Alcohol use: No  . Drug use: No  .  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 Physical activity:    Days per week: Not on file    Minutes per session: Not on file  . Stress: Not on file  Relationships  . Social connections:    Talks on phone: Not on file    Gets together: Not on file    Attends religious service: Not on file    Active member of club or organization: Not on file    Attends meetings of clubs or organizations: Not on file    Relationship status: Not on file  Other Topics Concern  . Not on file  Social History Narrative   Married father of one grandfather 14. Works out at [employer] at [employer] roughly 2-3 days a week. He works on a treadmill. He does note having a hard time getting his heart rate up.   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   He quit smoking in 1988, and does not drink alcohol.     Family History:  The patient's family history includes Cancer in his father and mother.   Review of Systems:   Please see the history of present illness.     General:  No chills, fever, night sweats or weight changes.  Cardiovascular:  No chest pain, dyspnea on exertion, edema, orthopnea, palpitations, paroxysmal nocturnal dyspnea. Dermatological: No rash, lesions/masses Respiratory: No cough, dyspnea Urologic: No hematuria, dysuria Abdominal:   No nausea, vomiting, diarrhea, bright red blood per rectum,  melena, or hematemesis Neurologic:  No visual changes, wkns, changes in mental status.  He denies any of the above symptoms.   All other systems reviewed and are otherwise negative except as noted above.   Physical Exam:    VS:  BP (!) 142/92   Pulse (!) 58   Ht _0  (1.727 m)   Wt 152 lb (68.9 kg)   SpO2 98%   BMI 23.11 kg/m    General: Well developed, well nourished Caucasian male appearing in no acute distress. Head: Normocephalic, atraumatic, sclera non-icteric, no xanthomas, nares are without discharge.  Neck: No carotid bruits. JVD not elevated.  Lungs: Respirations regular and unlabored, without wheezes or rales.  Heart: Regular rhythm, bradycardiac rate. No S3 or S4.  No murmur, no rubs, or gallops appreciated. Abdomen: Soft, non-tender, non-distended with normoactive bowel sounds. No hepatomegaly. No rebound/guarding. No obvious abdominal masses. Msk:  Strength and tone appear normal for age. No joint deformities or effusions. Extremities: No clubbing or cyanosis. No lower extremity edema.  Distal pedal pulses are 2+ bilaterally. Neuro: Alert and oriented X 3. Moves all extremities spontaneously. No focal deficits noted. Psych:  Responds to questions appropriately with a normal affect. Skin: No rashes or lesions noted  Wt Readings from Last 3 Encounters:  01/24/18 152 lb (68.9 kg)  04/29/17 157 lb (71.2 kg)  01/22/17 159 lb (72.1 kg)     Studies/Labs Reviewed:   EKG:  EKG is ordered today. The ekg ordered today demonstrates sinus bradycardia, heart rate 53, with prior anterior infarct and nonspecific IVCD. No acute changes when compared to prior tracings.  Recent Labs: 02/18/2017: ALT 11; BUN 41; Creat 2.24; Potassium 5.4; Sodium 141   Lipid Panel    Component Value Date/Time   CHOL 114 01/22/2017 1007   TRIG 65 01/22/2017 1007   HDL 41 01/22/2017 1007   CHOLHDL 2.8 01/22/2017 1007   VLDL  13 01/22/2017 1007   LDLCALC 60 01/22/2017 1007    Additional  studies/ records that were reviewed today include:   Echocardiogram: 11/2015 Study Conclusions  - Left ventricle: The cavity size was normal. Wall thickness was   normal. The estimated ejection fraction was 45%. There is   hypokinesis and scarring of the basalinferior myocardium.   Features are consistent with a pseudonormal left ventricular   filling pattern, with concomitant abnormal relaxation and   increased filling pressure (grade 2 diastolic dysfunction). - Aortic valve: Trileaflet; mildly calcified leaflets. - Mitral valve: Calcified annulus. There was mild regurgitation. - Left atrium: The atrium was mildly dilated. - Right ventricle: Systolic function was mildly reduced. - Right atrium: Central venous pressure (est): 3 mm Hg. - Atrial septum: No defect or patent foramen ovale was identified. - Tricuspid valve: There was trivial regurgitation. - Pulmonary arteries: PA peak pressure: 30 mm Hg (S). - Pericardium, extracardiac: There was no pericardial effusion.  Impressions:  - Normal LV wall thickness with LVEF approximately 45%. There is   hypokinesis and scarring of the basal inferior wall. Grade 2   diastolic dysfunction with increased filling pressure. Mild left   atrial enlargement. MAC with mild mitral regurgitation. Mildly   sclerotic aortic valve. Mildly reduced RV contraction. Trivial   tricuspid regurgitation with PASP 30 mmHg.  Event Monitor: 10/2015  Predominantly sinus rhythm with PVC, asymptomatic.  Isolated episode of sinus tachycardia, HR 120 bpm.    Assessment:    1. Preoperative cardiovascular examination   2. Coronary artery disease involving coronary bypass graft of native heart without angina pectoris   3. Ischemic cardiomyopathy   4. Essential hypertension   5. Hyperlipidemia LDL goal <70   6. Diabetes mellitus without complication ([place])      Plan:   In order of problems listed above:  1. Preoperative Cardiac Clearance for Hand  Surgery - The patient is planning to undergo surgery on his right hand for pulley release by Dr. Caralyn Guile next week.  He has already started holding ASA as surgery is scheduled for 01/29/2018. - In talking with the patient today, he is overall very functional and denies any recent chest discomfort or dyspnea on exertion. He is able to perform over 5 METS of activity without any anginal symptoms. RCRI Risk is 6.6% of major risk of cardiac events in the peri-operative setting given history of CAD and CHF. With the procedure itself overall being lower risk from a cardiac perspective, him denying any anginal symptoms, being euvolemic on examination, and EKG today showing no acute ischemic changes, would not pursue further cardiac testing prior to his planned surgery next week. He is of acceptable risk to proceed. Can hold ASA for 5 days prior to the procedure then resume afterwards.  2. CAD - s/p multiple prior interventions starting in 1988 with subsequent CABG in 11/2011 with LIMA-LAD, SVG-PDA, SVG-OM2, and SVG-D1. - He denies any recent chest pain or dyspnea on exertion. - EKG today shows no acute ischemic changes when compared to prior tracings. - Continue ASA and statin therapy. No beta-blocker given baseline bradycardia.  3. Ischemic Cardiomyopathy/Chronic combined systolic and diastolic CHF - EF 56% by echocardiogram in 11/2015. He denies any recent dyspnea on exertion, orthopnea, PND, or lower extremity edema and does not appear volume overloaded by physical examination today. - not on BB therapy given baseline bradycardia. Continue ARB.  - Recommended a repeat echocardiogram within the next year to reassess EF but it is not necessary for  him to have this procedure prior to his scheduled surgery.  4. HTN - BP is initially elevated to 164/61 during today's visit, slightly improved to 142/92 on recheck. - He is currently on Amlodipine-Olmesartan 5-20 mg daily. Recommended he continue to follow BP in  the ambulatory setting and report back to [place] or his PCP if readings remain elevated.   5. HLD - Followed by PCP. FLP in 12/2016 showed total cholesterol 114, triglycerides 65, HDL 41, and LDL 60.  At goal of LDL less than 70. - Continue Pravastatin 20 mg daily.  6. Type 2 DM - followed by PCP. Hgb A1c at 5.6 by most recent check.    Medication Adjustments/Labs and Tests Ordered: Current medicines are reviewed at length with the patient today.  Concerns regarding medicines are outlined above.  Medication changes, Labs and Tests ordered today are listed in the Patient Instructions below. Patient Instructions  Your physician wants you to follow-up in: 1 year with Dr.Koneswaran You will receive a reminder letter in the mail two months in advance. If you don't receive a letter, please call our office to schedule the follow-up appointment.  Your physician recommends that you continue on your current medications as directed. Please refer to the Current Medication list given to you today.  If you need a refill on your cardiac medications before your next appointment, please call your pharmacy.  No lab work or tests ordered today.   Thank you for choosing [facility] !         Signed, Erma Heritage, PA-C  01/24/2018 4:24 PM    [facility] [address] [place], [place] [address] Phone: [phone]

## 2018-01-24 ENCOUNTER — Ambulatory Visit: Payer: Medicare Other | Admitting: Student

## 2018-01-24 ENCOUNTER — Encounter: Payer: Self-pay | Admitting: Student

## 2018-01-24 ENCOUNTER — Other Ambulatory Visit: Payer: Self-pay | Admitting: Family Medicine

## 2018-01-24 VITALS — BP 142/92 | HR 58 | Ht 68.0 in | Wt 152.0 lb

## 2018-01-24 DIAGNOSIS — E785 Hyperlipidemia, unspecified: Secondary | ICD-10-CM

## 2018-01-24 DIAGNOSIS — Z01810 Encounter for preprocedural cardiovascular examination: Secondary | ICD-10-CM | POA: Diagnosis not present

## 2018-01-24 DIAGNOSIS — I255 Ischemic cardiomyopathy: Secondary | ICD-10-CM

## 2018-01-24 DIAGNOSIS — I10 Essential (primary) hypertension: Secondary | ICD-10-CM

## 2018-01-24 DIAGNOSIS — I25810 Atherosclerosis of coronary artery bypass graft(s) without angina pectoris: Secondary | ICD-10-CM | POA: Diagnosis not present

## 2018-01-24 DIAGNOSIS — E119 Type 2 diabetes mellitus without complications: Secondary | ICD-10-CM

## 2018-01-24 NOTE — Patient Instructions (Signed)
Your physician wants you to follow-up in:  1 year with Dr.Koneswaran You will receive a reminder letter in the mail two months in advance. If you don't receive a letter, please call our office to schedule the follow-up appointment.    Your physician recommends that you continue on your current medications as directed. Please refer to the Current Medication list given to you today.    If you need a refill on your cardiac medications before your next appointment, please call your pharmacy.      No lab work or tests ordered today.      Thank you for choosing [facility] !        

## 2018-01-27 ENCOUNTER — Ambulatory Visit (INDEPENDENT_AMBULATORY_CARE_PROVIDER_SITE_OTHER): Payer: Medicare Other | Admitting: Family Medicine

## 2018-01-27 ENCOUNTER — Encounter: Payer: Self-pay | Admitting: Family Medicine

## 2018-01-27 VITALS — BP 118/54 | HR 34 | Temp 97.9°F | Resp 12 | Ht 68.0 in | Wt 155.0 lb

## 2018-01-27 DIAGNOSIS — Z0000 Encounter for general adult medical examination without abnormal findings: Secondary | ICD-10-CM

## 2018-01-27 DIAGNOSIS — N184 Chronic kidney disease, stage 4 (severe): Secondary | ICD-10-CM

## 2018-01-27 DIAGNOSIS — E118 Type 2 diabetes mellitus with unspecified complications: Secondary | ICD-10-CM | POA: Diagnosis not present

## 2018-01-27 DIAGNOSIS — Z951 Presence of aortocoronary bypass graft: Secondary | ICD-10-CM

## 2018-01-27 DIAGNOSIS — Z125 Encounter for screening for malignant neoplasm of prostate: Secondary | ICD-10-CM | POA: Diagnosis not present

## 2018-01-27 DIAGNOSIS — I10 Essential (primary) hypertension: Secondary | ICD-10-CM

## 2018-01-27 LAB — LIPID PANEL
CHOL/HDL RATIO: 2.9 (calc) (ref <5.0)
CHOLESTEROL: 106 mg/dL (ref <200)
HDL: 37 mg/dL — AB (ref >40)
LDL CHOLESTEROL (CALC): 55 mg/dL
Non-HDL Cholesterol (Calc): 69 mg/dL (calc) (ref <130)
TRIGLYCERIDES: 63 mg/dL (ref <150)

## 2018-01-27 LAB — CBC WITH DIFFERENTIAL/PLATELET
BASOS PCT: 0.4 %
Basophils Absolute: 29 cells/uL (ref 0–200)
EOS ABS: 161 {cells}/uL (ref 15–500)
Eosinophils Relative: 2.2 %
HCT: 34.2 % — ABNORMAL LOW (ref 38.5–50.0)
HEMOGLOBIN: 11.6 g/dL — AB (ref 13.2–17.1)
Lymphs Abs: 1285 cells/uL (ref 850–3900)
MCH: 30.9 pg (ref 27.0–33.0)
MCHC: 33.9 g/dL (ref 32.0–36.0)
MCV: 91.2 fL (ref 80.0–100.0)
MONOS PCT: 6.4 %
MPV: 12.5 fL (ref 7.5–12.5)
NEUTROS ABS: 5358 {cells}/uL (ref 1500–7800)
Neutrophils Relative %: 73.4 %
Platelets: 173 10*3/uL (ref 140–400)
RBC: 3.75 10*6/uL — ABNORMAL LOW (ref 4.20–5.80)
RDW: 13 % (ref 11.0–15.0)
Total Lymphocyte: 17.6 %
WBC mixed population: 467 cells/uL (ref 200–950)
WBC: 7.3 10*3/uL (ref 3.8–10.8)

## 2018-01-27 NOTE — Progress Notes (Signed)
Subjective:    Patient ID: Bradley Black, male    DOB: 05/26/1946, 72 y.o.   MRN: [id number]  HPI  Patient is here today for complete physical exam. He denies any complaints today.  Patient is overdue for a colonoscopy.  Patient refuses a colonoscopy because he cannot tolerate the bowel prep.  He is interested in Cologuard.  However last year, the Cologuard was going to cost more than $600.  This year the coverage with Cologuard has improved.  He is interested in trying that again.  He is due for psa.  He sees an ophthalmologist annually for diabetic eye exam.  He is about due again for this.  His listed heart rate is 34 bpm.  However when I checked the patient, he is between 49 and 54 bpm at rest.  He denies any syncope, near syncope, dizziness, chest pain, shortness of breath.  He does report chronic fatigue.  He recently has been cleared for surgery for bilateral trigger fingers.  He is also seeing a nephrologist 3 times a year to monitor his chronic kidney disease.  I have placed him on sodium bicarbonate due to persistent hyperkalemia and metabolic acidosis related to his chronic kidney disease.  Blood pressure is well controlled today.  Denies falls, depression, or memory loss.    Past Medical History:  Diagnosis Date  . Acute myocardial infarction, History of: 1988   PTCA of OM; PTCA   . Benign essential HTN    right shoulder  . BPH (benign prostatic hyperplasia)   . CAD S/P percutaneous coronary angioplasty    a. PTCA of [place] b. PCI with BMS to LAD in 1997 c. RCA PCI [place] d. s/p CABG in 11/2011 with LIMA-LAD, SVG-PDA, SVG-OM2, and SVG-D1  . Chronic back pain   . CKD (chronic kidney disease), stage III ([place])   . Cyst of bursa    R shoulder  . Diabetic retinopathy ([place])   . DM (diabetes mellitus), type 2 with renal complications ([place])   . GERD (gastroesophageal reflux disease)   . Hyperlipidemia LDL goal <70   . Ischemic cardiomyopathy 11/2011   Intra-OP TEE: EF  40-45%, no regional WMA; improved Anterior WM post CABG.  . 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 BACK SURGERY  1970  . CARDIAC CATHETERIZATION  2013  . CORONARY ANGIOPLASTY  1994   OM  . CORONARY ANGIOPLASTY  1997   LAD  . CORONARY ANGIOPLASTY WITH STENT PLACEMENT  1999   RCA  . CORONARY ANGIOPLASTY WITH STENT PLACEMENT  2001   RCA  . CORONARY ARTERY BYPASS GRAFT  12/27/2011   Procedure: CORONARY ARTERY BYPASS GRAFTING (CABG);  Surgeon: Rexene Alberts, MD;  Location: [facility];  Service: Open Heart Surgery;  Laterality: N/A;  Times four. On pump. Using endoscopically harvested right greater saphenous vein and left internal mammary artery.   [name] HERNIA REPAIR    . INCISION AND DRAINAGE OF WOUND  2006   axilla  . INTRAOPERATIVE TRANSESOPHAGEAL ECHOCARDIOGRAM  12/27/2011   Global hypokinesis with EF of 40-45%, improved LAD distribution wall motion.  [name] LEFT HEART CATHETERIZATION WITH CORONARY ANGIOGRAM N/A 12/13/2011   Procedure: LEFT HEART CATHETERIZATION WITH CORONARY ANGIOGRAM;  Surgeon: Leonie Man, MD;  Location[facility] C[facility] LAB;  Service: Cardiovascular;  Laterality: N/A;  . LUMBAR FUSION    . MASS EXCISION  10/24/11   R arm  . TRANSESOPHAGEAL ECHOCARDIOGRAM  2013   Current Outpatient Medications on File Prior to Visit  Medication Sig Dispense Refill  . amLODipine-olmesartan (AZOR) 5-20 MG tablet     . aspirin EC 81 MG tablet Take 81 mg by mouth daily.    . citalopram (CELEXA) 10 MG tablet TAKE ONE (1) TABLET EACH DAY 90 tablet 3  . doxazosin (CARDURA) 4 MG tablet TAKE ONE (1) TABLET BY MOUTH EVERY DAY 90 tablet 3  . JANUVIA 50 MG tablet TAKE 1 TABLET BY MOUTH EVERY DAY 90 tablet 0  . pantoprazole (PROTONIX) 40 MG tablet TAKE ONE TABLET TWICE DAILY 180 tablet 3  . pravastatin (PRAVACHOL) 20 MG tablet TAKE ONE (1) TABLET BY MOUTH EVERY DAY 90 tablet 3  . sildenafil  (VIAGRA) 100 MG tablet Take 0.5-1 tablets (50-100 mg total) by mouth daily as needed for erectile dysfunction. 60 tablet 5  . sodium bicarbonate 650 MG tablet Take 650 mg by mouth 3 (three) times daily.     No current facility-administered medications on file prior to visit.    No Known Allergies Social History   Socioeconomic History  . Marital status: Married    Spouse name: Not on file  . Number of children: Not on file  . Years of education: Not on file  . Highest education level: Not on file  Occupational History  . Not on file  Social Needs  . Financial resource strain: Not on file  . Food insecurity:    Worry: Not on file    Inability: Not on file  . Transportation needs:    Medical: Not on file    Non-medical: Not on file  Tobacco Use  . Smoking status: Former Smoker    Types: Cigarettes    Last attempt to quit: 10/01/1986    Years since quitting: 31.3  . Smokeless tobacco: Never Used  . Tobacco comment: quit about 30 yrs ago  Substance and Sexual Activity  . Alcohol use: No  . Drug use: No  . 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 Physical activity:    Days per week: Not on file    Minutes per session: Not on file  . Stress: Not on file  Relationships  . Social connections:    Talks on phone: Not on file    Gets together: Not on file    Attends religious service: Not on file    Active member of club or organization: Not on file    Attends meetings of clubs or organizations: Not on file    Relationship status: Not on file  . Intimate partner violence:    Fear of current or ex partner: Not on file    Emotionally abused: Not on file    Physically abused: Not on file    Forced sexual activity: Not on file  Other Topics Concern  . Not on file  Social History Narrative   Married father of one grandfather 62. Works out at [employer] at [employer] roughly 2-3 days a week. He works on a  treadmill. He does note having a hard time getting his heart rate up.   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   He quit smoking in 1988, and does not drink alcohol.   Family History  Problem Relation Age of Onset  . Cancer Mother        breast  . Cancer Father        bone  . [place] cancer Neg Hx      Review of Systems  All other systems reviewed and are negative.      Objective:   Physical Exam  Constitutional: He is oriented to person, place, and time. He appears well-developed and well-nourished. No distress.  HENT:  Head: Normocephalic and atraumatic.  Right Ear: External ear normal.  Left Ear: External ear normal.  Nose: Nose normal.  Mouth/Throat: Oropharynx is clear and moist. No oropharyngeal exudate.  Eyes: Pupils are equal, round, and reactive to light. Conjunctivae and EOM are normal. Right eye exhibits no discharge. Left eye exhibits no discharge. No scleral icterus.  Neck: Normal range of motion. Neck supple. No JVD present. No tracheal deviation present. No thyromegaly present.  Cardiovascular: Normal rate, regular rhythm, normal heart sounds and intact distal pulses. Exam reveals no gallop and no friction rub.  No murmur heard. Pulmonary/Chest: Effort normal and breath sounds normal. No stridor. No respiratory distress. He has no wheezes. He has no rales. He exhibits no tenderness.  Abdominal: Soft. Bowel sounds are normal. He exhibits no distension and no mass. There is no tenderness. There is no rebound and no guarding.  Musculoskeletal: Normal range of motion. He exhibits no edema or tenderness.  Lymphadenopathy:    He has no cervical adenopathy.  Neurological: He is alert and oriented to person, place, and time. He has normal reflexes. No cranial nerve deficit. Coordination normal.  Skin: Skin is warm. No rash noted. He is not diaphoretic. No erythema. No pallor.  Psychiatric: He has a normal mood and affect. His behavior is normal. Judgment and thought  content normal.  Vitals reviewed.         Assessment & Plan:  Controlled type 2 diabetes mellitus with complication, without long-term current use of insulin (HCC) - Plan: CBC with Differential/Platelet, Lipid panel, Hemoglobin A1c, Microalbumin, urine  Prostate cancer screening - Plan: PSA  General medical exam  Benign essential HTN  S/P CABG x 4  CKD (chronic kidney disease), stage IV ([place])  Immunization History  Administered Date(s) Administered  . Influenza, High Dose Seasonal PF 08/10/2016, 07/31/2017  . Influenza,inj,Quad PF,6+ Mos 07/12/2015  . Pneumococcal Conjugate-13 04/19/2015  . Pneumococcal Polysaccharide-23 02/12/2013  . Tdap 10/01/2001  . Zoster 03/04/2007   Immunizations are up-to-date.  I will register the patient for Cologuard again.  If reasonably price, the patient will proceed with this.  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  I will screen the patient for prostate cancer with a PSA.  His blood pressure today is well controlled.  He is on the appropriate medications for coronary artery disease.  He cannot tolerate a beta-blocker due to his profound sinus bradycardia.  However at this point he is relatively asymptomatic and does not require a pacemaker.  I will check a hemoglobin A1c, fasting lipid panel, and a CBC along with a urine microalbumin.  However he is on the highest dose of an angiotensin receptor blocker.  Patient is monitoring his kidney function quarterly at his nephrologist.

## 2018-01-28 LAB — HEMOGLOBIN A1C
HEMOGLOBIN A1C: 6.4 %{Hb} — AB (ref <5.7)
MEAN PLASMA GLUCOSE: 137 (calc)
eAG (mmol/L): 7.6 (calc)

## 2018-01-28 LAB — MICROALBUMIN, URINE: MICROALB UR: 50.8 mg/dL

## 2018-01-28 LAB — PSA: PSA: 1.2 ng/mL (ref <=4.0)

## 2018-01-29 DIAGNOSIS — M65341 Trigger finger, right ring finger: Secondary | ICD-10-CM | POA: Diagnosis not present

## 2018-01-29 DIAGNOSIS — M65331 Trigger finger, right middle finger: Secondary | ICD-10-CM | POA: Diagnosis not present

## 2018-02-16 DIAGNOSIS — Z1211 Encounter for screening for malignant neoplasm of colon: Secondary | ICD-10-CM | POA: Diagnosis not present

## 2018-02-16 DIAGNOSIS — Z1212 Encounter for screening for malignant neoplasm of rectum: Secondary | ICD-10-CM | POA: Diagnosis not present

## 2018-02-17 ENCOUNTER — Encounter: Payer: Self-pay | Admitting: Family Medicine

## 2018-02-27 ENCOUNTER — Ambulatory Visit (INDEPENDENT_AMBULATORY_CARE_PROVIDER_SITE_OTHER): Payer: Medicare Other | Admitting: Family Medicine

## 2018-02-27 ENCOUNTER — Encounter: Payer: Self-pay | Admitting: Family Medicine

## 2018-02-27 ENCOUNTER — Other Ambulatory Visit: Payer: Self-pay

## 2018-02-27 VITALS — BP 124/64 | HR 55 | Temp 98.2°F | Resp 15 | Ht 68.0 in | Wt 158.1 lb

## 2018-02-27 DIAGNOSIS — H6122 Impacted cerumen, left ear: Secondary | ICD-10-CM

## 2018-02-27 DIAGNOSIS — H60502 Unspecified acute noninfective otitis externa, left ear: Secondary | ICD-10-CM | POA: Diagnosis not present

## 2018-02-27 DIAGNOSIS — H9202 Otalgia, left ear: Secondary | ICD-10-CM | POA: Diagnosis not present

## 2018-02-27 MED ORDER — CARBAMIDE PEROXIDE 6.5 % OT SOLN: 5.0000 [drp] | Freq: Two times a day (BID) | OTIC | 0 refills | Status: DC | PRN

## 2018-02-27 MED ORDER — NEOMYCIN-POLYMYXIN-HC 3.5-10000-1 OT SOLN: 4.0000 [drp] | Freq: Four times a day (QID) | OTIC | 0 refills | Status: AC

## 2018-02-27 NOTE — Patient Instructions (Signed)
Get the over-the-counter ointment drops to help soften wax and remove it at home with gentle irrigation.  Your left ear canal did have some swelling and tenderness will treat you with antibiotic and steroid drops for otitis externa.  Please follow-up with [place] if it does not improve    Earwax Buildup, Adult The ears produce a substance called earwax that helps keep bacteria out of the ear and protects the skin in the ear canal. Occasionally, earwax can build up in the ear and cause discomfort or hearing loss. What increases the risk? This condition is more likely to develop in people who:  Are male.  Are elderly.  Naturally produce more earwax.  Clean their ears often with cotton swabs.  Use earplugs often.  Use in-ear headphones often.  Wear hearing aids.  Have narrow ear canals.  Have earwax that is overly thick or sticky.  Have eczema.  Are dehydrated.  Have excess hair in the ear canal.  What are the signs or symptoms? Symptoms of this condition include:  Reduced or muffled hearing.  A feeling of fullness in the ear or feeling that the ear is plugged.  Fluid coming from the ear.  Ear pain.  Ear itch.  Ringing in the ear.  Coughing.  An obvious piece of earwax that can be seen inside the ear canal.  How is this diagnosed? This condition may be diagnosed based on:  Your symptoms.  Your medical history.  An ear exam. During the exam, your health care provider will look into your ear with an instrument called an otoscope.  You may have tests, including a hearing test. How is this treated? This condition may be treated by:  Using ear drops to soften the earwax.  Having the earwax removed by a health care provider. The health care provider may: ? Flush the ear with water. ? Use an instrument that has a loop on the end (curette). ? Use a suction device.  Surgery to remove the wax buildup. This may be done in severe cases.  Follow these  instructions at home:  Take over-the-counter and prescription medicines only as told by your health care provider.  Do not put any objects, including cotton swabs, into your ear. You can clean the opening of your ear canal with a washcloth or facial tissue.  Follow instructions from your health care provider about cleaning your ears. Do not over-clean your ears.  Drink enough fluid to keep your urine clear or pale yellow. This will help to thin the earwax.  Keep all follow-up visits as told by your health care provider. If earwax builds up in your ears often or if you use hearing aids, consider seeing your health care provider for routine, preventive ear cleanings. Ask your health care provider how often you should schedule your cleanings.  If you have hearing aids, clean them according to instructions from the manufacturer and your health care provider. Contact a health care provider if:  You have ear pain.  You develop a fever.  You have blood, pus, or other fluid coming from your ear.  You have hearing loss.  You have ringing in your ears that does not go away.  Your symptoms do not improve with treatment.  You feel like the room is spinning (vertigo). Summary  Earwax can build up in the ear and cause discomfort or hearing loss.  The most common symptoms of this condition include reduced or muffled hearing and a feeling of fullness in the  ear or feeling that the ear is plugged.  This condition may be diagnosed based on your symptoms, your medical history, and an ear exam.  This condition may be treated by using ear drops to soften the earwax or by having the earwax removed by a health care provider.  Do not put any objects, including cotton swabs, into your ear. You can clean the opening of your ear canal with a washcloth or facial tissue. This information is not intended to replace advice given to you by your health care provider. Make sure you discuss any questions you  have with your health care provider. Document Released: 10/25/2004 Document Revised: 11/28/2016 Document Reviewed: 11/28/2016 Elsevier Interactive Patient Education  [employer].

## 2018-02-27 NOTE — Progress Notes (Signed)
Patient ID: Bradley Black, male    DOB: Jun 17, 1946, 72 y.o.   MRN: [id number]  PCP: Susy Frizzle, MD  Chief Complaint  Patient presents with  . Ear Pain    Patient in today to with c/o ear pain to left ear and build up of ear wax.    Subjective:   Bradley Black is a 72 y.o. male, presents to clinic with CC of of left ear feeling blocked with decreased hearing, sounds like he is listening in his seashell, he suspects he has earwax buildup.  Has history of this, has been irrigated multiple times in the past.  He does not use Q-tips.  He does have a little bit of associated pain and discomfort with manipulation of his ear, he denies any drainage, swelling, denies any recent URI symptoms, headaches, fevers.  He has had no ringing in his ears, denies vertigo, nausea, vomiting or balance issues.   Patient Active Problem List   Diagnosis Date Noted  . Diabetes mellitus without complication ([place]) 07/17/5101  . Cervicalgia 03/08/2014  . Whiplash injury to neck 03/08/2014  . Aortic heart murmur 10/18/2013  . Acute myocardial infarction, History of:   [name] Benign essential HTN   . Hyperlipidemia LDL goal <70   . Anemia 04/23/2013  . 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 Ischemic cardiomyopathy 11/30/2011  . Actinic keratosis, left scalp 10/04/2011  . Seborrheic keratosis, right lateral leg 10/04/2011     Prior to Admission medications   Medication Sig Start Date End Date Taking? Authorizing Provider  amLODipine-olmesartan (AZOR) 5-20 MG tablet  04/10/17  Yes [provider]  aspirin EC 81 MG tablet Take 81 mg by mouth daily.   Yes [provider]  citalopram (CELEXA) 10 MG tablet TAKE ONE (1) TABLET EACH DAY 08/16/17  Yes Susy Frizzle, MD  doxazosin (CARDURA) 4 MG tablet TAKE ONE (1) TABLET BY MOUTH EVERY DAY 11/26/17  Yes Susy Frizzle, MD  JANUVIA 50 MG tablet TAKE 1 TABLET BY MOUTH EVERY DAY 01/24/18  Yes  Susy Frizzle, MD  pantoprazole (PROTONIX) 40 MG tablet TAKE ONE TABLET TWICE DAILY 04/08/17  Yes Susy Frizzle, MD  pravastatin (PRAVACHOL) 20 MG tablet TAKE ONE (1) TABLET BY MOUTH EVERY DAY 12/27/17  Yes Susy Frizzle, MD  sildenafil (VIAGRA) 100 MG tablet Take 0.5-1 tablets (50-100 mg total) by mouth daily as needed for erectile dysfunction. 04/29/17  Yes Susy Frizzle, MD  sodium bicarbonate 650 MG tablet Take 650 mg by mouth 3 (three) times daily.   Yes [provider]     No Known Allergies   Family History  Problem Relation Age of Onset  . Cancer Mother        breast  . Cancer Father        bone  . [place] cancer Neg Hx      Social History   Socioeconomic History  . Marital status: Married    Spouse name: Not on file  . Number of children: Not on file  . Years of education: Not on file  . Highest education level: Not on file  Occupational History  . Not on file  Social Needs  . Financial resource strain: Not on file  . Food insecurity:    Worry: Not on file    Inability: Not on file  . Transportation needs:    Medical: Not  on file    Non-medical: Not on file  Tobacco Use  . Smoking status: Former Smoker    Types: Cigarettes    Last attempt to quit: 10/01/1986    Years since quitting: 31.4  . Smokeless tobacco: Never Used  . Tobacco comment: quit about 30 yrs ago  Substance and Sexual Activity  . Alcohol use: No  . Drug use: No  . 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 Physical activity:    Days per week: Not on file    Minutes per session: Not on file  . Stress: Not on file  Relationships  . Social connections:    Talks on phone: Not on file    Gets together: Not on file    Attends religious service: Not on file    Active member of club or organization: Not on file    Attends meetings of clubs or organizations: Not on file    Relationship status: Not  on file  . Intimate partner violence:    Fear of current or ex partner: Not on file    Emotionally abused: Not on file    Physically abused: Not on file    Forced sexual activity: Not on file  Other Topics Concern  . Not on file  Social History Narrative   Married father of one grandfather 70. Works out at [employer] roughly 2-3 days a week. He works on a treadmill. He does note having a hard time getting his heart rate up.   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   He quit smoking in 1988, and does not drink alcohol.     Review of Systems  Constitutional: Negative.   HENT: Positive for ear pain. Negative for congestion, ear discharge, facial swelling, postnasal drip, rhinorrhea, sinus pressure, sinus pain, sore throat and voice change.   Eyes: Negative.   Respiratory: Negative.   Cardiovascular: Negative.   Skin: Negative.   Neurological: Negative.   All other systems reviewed and are negative.      Objective:    Vitals:   02/27/18 1404  BP: 124/64  Pulse: (!) 55  Resp: 15  Temp: 98.2 F (36.8 C)  TempSrc: Oral  SpO2: 97%  Weight: 158 lb 2 oz (71.7 kg)  Height: 5\' 8"  (1.727 m)      Physical Exam  Constitutional: He appears well-developed.  HENT:  Head: Normocephalic and atraumatic.  Nose: Nose normal.  Right TM normal, right external auditory canal normal with a small amount of cerumen, external ear normal without tenderness to palpation Left TM not visible, external auditory canal edematous and mildly erythematous and impacted with cerumen, light brown.  No tragus or pinna tenderness to palpation, no mastoid tenderness to palpation, no auricular lymphadenopathy.  No  purulent drainage Nose normal OP normal No sinus tenderness No cervical lymphadenopathy  Eyes: Right eye exhibits no discharge. Left eye exhibits no discharge.  Neck: No tracheal deviation present.  Cardiovascular: Normal rate and regular rhythm.  Pulmonary/Chest: Effort normal.  No stridor. No respiratory distress.  Musculoskeletal: Normal range of motion.  Neurological: He is alert. He exhibits normal muscle tone. Coordination normal.  Skin: Skin is warm and dry. No rash noted.  Psychiatric: He has a normal mood and affect. His behavior is normal.  Nursing note and vitals reviewed.   Procedure: Cerumen Disimpaction  Gentle ear lavage with warm  water and hydrogen peroxide performed on the left.  Repeated irrigation and instrumentation with curette under direct visualization was unsuccessful at completely removing all obstructing cerumen.  Left TM not visualized due to continued cerumen obstructing, auditory canals are inflamed and tender.  The patient reported incomplete improvement of his symptoms.           Assessment & Plan:      ICD-10-CM   1. Left ear pain H92.02 neomycin-polymyxin-hydrocortisone (CORTISPORIN) OTIC solution  2. Impacted cerumen of left ear H61.22 carbamide peroxide (DEBROX) 6.5 % OTIC solution  3. Acute otitis externa of left ear, unspecified type H60.502 neomycin-polymyxin-hydrocortisone (CORTISPORIN) OTIC solution    Patient with left ear pain secondary to cerumen impaction, ample hard impacted brown cerumen was removed from the left ear but patient was unable to tolerate further irrigation and instrumentation after multiple attempts in clinic.  We will treat left external auditory canal for inflammation and otitis externa, will soften wax with Debrox drops and have patient return for repeated irrigation.  Plan reviewed with patient, he is going on vacation and will use over-the-counter drops and return to clinic when he returns.   Delsa Grana, PA-C 02/27/18 2:18 PM

## 2018-03-03 LAB — COLOGUARD

## 2018-03-06 ENCOUNTER — Encounter: Payer: Self-pay | Admitting: *Deleted

## 2018-03-06 ENCOUNTER — Encounter: Payer: Self-pay | Admitting: Family Medicine

## 2018-03-06 ENCOUNTER — Ambulatory Visit (INDEPENDENT_AMBULATORY_CARE_PROVIDER_SITE_OTHER): Payer: Medicare Other | Admitting: Family Medicine

## 2018-03-06 VITALS — BP 130/80 | HR 62 | Temp 98.0°F | Resp 14 | Ht 68.0 in | Wt 158.4 lb

## 2018-03-06 DIAGNOSIS — H6122 Impacted cerumen, left ear: Secondary | ICD-10-CM | POA: Diagnosis not present

## 2018-03-06 DIAGNOSIS — H9192 Unspecified hearing loss, left ear: Secondary | ICD-10-CM

## 2018-03-06 DIAGNOSIS — R94120 Abnormal auditory function study: Secondary | ICD-10-CM | POA: Diagnosis not present

## 2018-03-06 NOTE — Progress Notes (Signed)
Patient ID: Bradley Black, male    DOB: [birth year], 72 y.o.   MRN: [id number]  PCP: Bradley Frizzle, MD  Chief Complaint  Patient presents with  . follow up left ear stopped up    Subjective:    Bradley Black is a 72 year old male returns for repeated cerumen disimpaction in his left ear.  He has been using Debrox earwax softening drops, notes that he was able to get a little bit of wax out, he is also been using antibiotic drops and has completely resolved left ear pain and tenderness.  His hearing continues to be decreased on the left.  He has no other acute complaints, no other associated complaints no other aggravating or alleviating factors.   HPI from 02/27/18 Bradley Black is a 72 y.o. male, presents to clinic with CC of of left ear feeling blocked with decreased hearing, sounds like he is listening in his seashell, he suspects he has earwax buildup.  Has history of this, has been irrigated multiple times in the past.  He does not use Q-tips.  He does have a little bit of associated pain and discomfort with manipulation of his ear, he denies any drainage, swelling, denies any recent URI symptoms, headaches, fevers.  He has had no ringing in his ears, denies vertigo, nausea, vomiting or balance issues.   Patient Active Problem List   Diagnosis Date Noted  . 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 Diabetes mellitus without complication ([place]) 78/67/6720  . Cervicalgia 03/08/2014  . Whiplash injury to neck 03/08/2014  . Aortic heart murmur 10/18/2013  . Acute myocardial infarction, History of:   [name] Benign essential HTN   . Hyperlipidemia LDL goal <70   . Anemia 04/23/2013  . 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 Ischemic cardiomyopathy 11/30/2011  . Actinic keratosis, left scalp 10/04/2011  . Seborrheic keratosis, right lateral leg 10/04/2011     Prior to Admission medications   Medication Sig Start Date  End Date Taking? Authorizing Provider  amLODipine-olmesartan (AZOR) 5-20 MG tablet  04/10/17  Yes [provider]  aspirin EC 81 MG tablet Take 81 mg by mouth daily.   Yes [provider]  citalopram (CELEXA) 10 MG tablet TAKE ONE (1) TABLET EACH DAY 08/16/17  Yes Bradley Frizzle, MD  doxazosin (CARDURA) 4 MG tablet TAKE ONE (1) TABLET BY MOUTH EVERY DAY 11/26/17  Yes Bradley Frizzle, MD  JANUVIA 50 MG tablet TAKE 1 TABLET BY MOUTH EVERY DAY 01/24/18  Yes Bradley Frizzle, MD  pantoprazole (PROTONIX) 40 MG tablet TAKE ONE TABLET TWICE DAILY 04/08/17  Yes Bradley Frizzle, MD  pravastatin (PRAVACHOL) 20 MG tablet TAKE ONE (1) TABLET BY MOUTH EVERY DAY 12/27/17  Yes Bradley Frizzle, MD  sildenafil (VIAGRA) 100 MG tablet Take 0.5-1 tablets (50-100 mg total) by mouth daily as needed for erectile dysfunction. 04/29/17  Yes Bradley Frizzle, MD  sodium bicarbonate 650 MG tablet Take 650 mg by mouth 3 (three) times daily.   Yes [provider]     No Known Allergies   Family History  Problem Relation Age of Onset  . Cancer Mother        breast  . Cancer Father        bone  . [place] cancer Neg Hx      Social History   Socioeconomic History  .  Marital status: Married    Spouse name: Not on file  . Number of children: Not on file  . Years of education: Not on file  . Highest education level: Not on file  Occupational History  . Not on file  Social Needs  . Financial resource strain: Not on file  . Food insecurity:    Worry: Not on file    Inability: Not on file  . Transportation needs:    Medical: Not on file    Non-medical: Not on file  Tobacco Use  . Smoking status: Former Smoker    Types: Cigarettes    Last attempt to quit: 10/01/1986    Years since quitting: 31.4  . Smokeless tobacco: Never Used  . Tobacco comment: quit about 30 yrs ago  Substance and Sexual Activity  . Alcohol use: No  . Drug use: No  . 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 Physical activity:    Days per week: Not on file    Minutes per session: Not on file  . Stress: Not on file  Relationships  . Social connections:    Talks on phone: Not on file    Gets together: Not on file    Attends religious service: Not on file    Active member of club or organization: Not on file    Attends meetings of clubs or organizations: Not on file    Relationship status: Not on file  . Intimate partner violence:    Fear of current or ex partner: Not on file    Emotionally abused: Not on file    Physically abused: Not on file    Forced sexual activity: Not on file  Other Topics Concern  . Not on file  Social History Narrative   Married father of one grandfather 62. Works out at [employer] at [employer] roughly 2-3 days a week. He works on a treadmill. He does note having a hard time getting his heart rate up.   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   He quit smoking in 1988, and does not drink alcohol.     Review of Systems  Constitutional: Negative.   HENT: Negative.   Eyes: Negative.   Musculoskeletal: Negative.   Skin: Negative.   Neurological: Negative.   Hematological: Negative.   All other systems reviewed and are negative.      Objective:    Vitals:   03/06/18 1359  BP: 130/80  Pulse: 62  Resp: 14  Temp: 98 F (36.7 C)  TempSrc: Oral  SpO2: 96%  Weight: 158 lb 6.4 oz (71.8 kg)  Height: 5\' 8"  (1.727 m)      Physical Exam  Constitutional: He appears well-developed and well-nourished. No distress.  HENT:  Head: Normocephalic and atraumatic. Head is without right periorbital erythema and without left periorbital erythema.  Right Ear: Hearing, tympanic membrane, external ear and ear canal normal.  Left Ear: External ear normal. No drainage or swelling. Decreased hearing is noted.  Nose: Nose normal.  Mouth/Throat: Oropharynx is clear and moist. No  oropharyngeal exudate.  Left TM not visible due to obstructing cerumen, cerumen is dark brown, shiny, appears soft Visible portions of left external auditory canal are normal without edema, erythema, tenderness or drainage Bilateral external ears appear normal, no tragus tenderness to palpation or pinna tenderness with palpation bilaterally Nose normal OP normal No sinus  tenderness No cervical lymphadenopathy  Eyes: Pupils are equal, round, and reactive to light. Conjunctivae are normal. Right eye exhibits no discharge. Left eye exhibits no discharge. No scleral icterus.  Neck: No tracheal deviation present.  Cardiovascular: Normal rate and regular rhythm.  Pulmonary/Chest: Effort normal. No stridor. No respiratory distress.  Musculoskeletal: Normal range of motion.  Neurological: He is alert. He exhibits normal muscle tone. Coordination normal.  Skin: Skin is warm and dry. No rash noted. He is not diaphoretic.  Psychiatric: He has a normal mood and affect. His behavior is normal.  Nursing note and vitals reviewed.   Procedure: Cerumen Disimpaction  Gentle ear lavage with warm water and hydrogen peroxide performed on the left. There were no complications and following the disimpaction the tympanic membrane was visible on the left, normal appearing, no perforation, translucent pearly gray with normal cone of light, very mild EAC injection on left.  . Tympanic membranes are intact following the procedure.   The patient reported no improvement of hearing after removal of cerumen.   Hearing screening done - see chart - failed     Assessment & Plan:    Pt is a 72 y/o male, returned to clinic for further left ear irrigation to remove impacted cerumen after partial removal at appt one week ago, also dx with otitis externa, this has resolved, he is still concerned about decreased hearing, worse in L ear.    ICD-10-CM   1. Left ear impacted cerumen H61.22   2. Decreased hearing of left ear  H91.92 Ambulatory referral to ENT  3. Failed hearing screening R94.120 Ambulatory referral to ENT        Delsa Grana, PA-C 03/06/18 2:16 PM

## 2018-04-18 DIAGNOSIS — N183 Chronic kidney disease, stage 3 (moderate): Secondary | ICD-10-CM | POA: Diagnosis not present

## 2018-04-18 DIAGNOSIS — R809 Proteinuria, unspecified: Secondary | ICD-10-CM | POA: Diagnosis not present

## 2018-04-18 DIAGNOSIS — Z79899 Other long term (current) drug therapy: Secondary | ICD-10-CM | POA: Diagnosis not present

## 2018-04-18 DIAGNOSIS — E559 Vitamin D deficiency, unspecified: Secondary | ICD-10-CM | POA: Diagnosis not present

## 2018-04-18 DIAGNOSIS — D509 Iron deficiency anemia, unspecified: Secondary | ICD-10-CM | POA: Diagnosis not present

## 2018-04-23 DIAGNOSIS — E1129 Type 2 diabetes mellitus with other diabetic kidney complication: Secondary | ICD-10-CM | POA: Diagnosis not present

## 2018-04-23 DIAGNOSIS — E872 Acidosis: Secondary | ICD-10-CM | POA: Diagnosis not present

## 2018-04-23 DIAGNOSIS — N184 Chronic kidney disease, stage 4 (severe): Secondary | ICD-10-CM | POA: Diagnosis not present

## 2018-04-23 DIAGNOSIS — R809 Proteinuria, unspecified: Secondary | ICD-10-CM | POA: Diagnosis not present

## 2018-04-23 DIAGNOSIS — I509 Heart failure, unspecified: Secondary | ICD-10-CM | POA: Diagnosis not present

## 2018-04-24 ENCOUNTER — Ambulatory Visit (INDEPENDENT_AMBULATORY_CARE_PROVIDER_SITE_OTHER): Payer: Self-pay | Admitting: Otolaryngology

## 2018-04-28 ENCOUNTER — Ambulatory Visit (INDEPENDENT_AMBULATORY_CARE_PROVIDER_SITE_OTHER): Payer: Medicare Other | Admitting: Otolaryngology

## 2018-04-28 ENCOUNTER — Other Ambulatory Visit (INDEPENDENT_AMBULATORY_CARE_PROVIDER_SITE_OTHER): Payer: Self-pay | Admitting: Otolaryngology

## 2018-04-28 ENCOUNTER — Other Ambulatory Visit: Payer: Self-pay | Admitting: Family Medicine

## 2018-04-28 DIAGNOSIS — H918X9 Other specified hearing loss, unspecified ear: Secondary | ICD-10-CM

## 2018-04-28 DIAGNOSIS — H903 Sensorineural hearing loss, bilateral: Secondary | ICD-10-CM

## 2018-04-28 DIAGNOSIS — H9222 Otorrhagia, left ear: Secondary | ICD-10-CM | POA: Diagnosis not present

## 2018-05-06 ENCOUNTER — Other Ambulatory Visit (INDEPENDENT_AMBULATORY_CARE_PROVIDER_SITE_OTHER): Payer: Self-pay | Admitting: Otolaryngology

## 2018-05-06 ENCOUNTER — Ambulatory Visit (HOSPITAL_COMMUNITY)
Admission: RE | Admit: 2018-05-06 | Discharge: 2018-05-06 | Disposition: A | Discharge status: Home / Self Care | Payer: Medicare Other | Source: Ambulatory Visit | Attending: Otolaryngology | Admitting: Otolaryngology

## 2018-05-06 DIAGNOSIS — H918X9 Other specified hearing loss, unspecified ear: Secondary | ICD-10-CM

## 2018-05-06 DIAGNOSIS — I6782 Cerebral ischemia: Secondary | ICD-10-CM | POA: Insufficient documentation

## 2018-05-06 DIAGNOSIS — R9082 White matter disease, unspecified: Secondary | ICD-10-CM | POA: Insufficient documentation

## 2018-05-06 DIAGNOSIS — H9192 Unspecified hearing loss, left ear: Secondary | ICD-10-CM | POA: Diagnosis not present

## 2018-05-06 LAB — POCT I-STAT CREATININE: Creatinine, Ser: 2.1 mg/dL — ABNORMAL HIGH (ref 0.61–1.24)

## 2018-05-12 ENCOUNTER — Ambulatory Visit (INDEPENDENT_AMBULATORY_CARE_PROVIDER_SITE_OTHER): Payer: Medicare Other | Admitting: Otolaryngology

## 2018-05-12 DIAGNOSIS — H903 Sensorineural hearing loss, bilateral: Secondary | ICD-10-CM | POA: Diagnosis not present

## 2018-05-12 DIAGNOSIS — H9222 Otorrhagia, left ear: Secondary | ICD-10-CM

## 2018-05-23 DIAGNOSIS — M65341 Trigger finger, right ring finger: Secondary | ICD-10-CM | POA: Diagnosis not present

## 2018-05-23 DIAGNOSIS — M65331 Trigger finger, right middle finger: Secondary | ICD-10-CM | POA: Diagnosis not present

## 2018-05-27 ENCOUNTER — Ambulatory Visit (HOSPITAL_COMMUNITY): Payer: Medicare Other | Attending: Orthopedic Surgery | Admitting: Occupational Therapy

## 2018-05-27 ENCOUNTER — Encounter (HOSPITAL_COMMUNITY): Payer: Self-pay | Admitting: Occupational Therapy

## 2018-05-27 ENCOUNTER — Other Ambulatory Visit: Payer: Self-pay

## 2018-05-27 DIAGNOSIS — M79644 Pain in right finger(s): Secondary | ICD-10-CM | POA: Insufficient documentation

## 2018-05-27 DIAGNOSIS — R29898 Other symptoms and signs involving the musculoskeletal system: Secondary | ICD-10-CM | POA: Diagnosis not present

## 2018-05-27 NOTE — Patient Instructions (Signed)
Tendon Gliding Exercises: Complete 10X, hold for 3-5 seconds, 1-2x per day  1) Straight: begin with wrist in extended position and fingers straight      2) Hook: Bend your fingers making them look like a hook while keeping your thumb straight.      3) Fist: Make your hand into a fist.      4) Table Top: Straighten your fingers straight out making them look like a table top.      5) Straight Fist: Bend your fingers straight down into a straight fist.     Home Exercises Program Theraputty Exercises  Do the following exercises 2 times a day using your affected hand.  1. Roll putty into a ball.  2. Make into a pancake.  3. Roll putty into a roll.  4. Pinch along log with first finger and thumb.   5. Make into a ball.  6. Roll it back into a log.   7. Pinch using thumb, middle, and ring fingers  8. Roll into a ball, then flatten into a pancake.

## 2018-05-27 NOTE — Therapy (Signed)
[place], [name], [address] Phone: [phone]   Fax:  [phone]  Occupational Therapy Evaluation  Patient Details  Name: Bradley Black MRN: [id number] Date of Birth: [birth year] Referring Provider: Dr. Iran Planas   Encounter Date: 05/27/2018  OT End of Session - 05/27/18 1343    Visit Number  1    Number of Visits  5    Date for OT Re-Evaluation  06/26/18    Authorization Type  [employer] Medicare; $40 copay    OT Start Time  1302    OT Stop Time  1330    OT Time Calculation (min)  28 min    Activity Tolerance  Patient tolerated treatment well    Behavior During Therapy  [facility] for tasks assessed/performed       Past Medical History:  Diagnosis Date  . Acute myocardial infarction, History of: 1988   PTCA of OM; PTCA   . Benign essential HTN    right shoulder  . BPH (benign prostatic hyperplasia)   . CAD S/P percutaneous coronary angioplasty    a. PTCA of [place] b. PCI with BMS to LAD in 1997 c. RCA PCI [place] d. s/p CABG in 11/2011 with LIMA-LAD, SVG-PDA, SVG-OM2, and SVG-D1  . Chronic back pain   . CKD (chronic kidney disease), stage III ([place])   . Cyst of bursa    R shoulder  . Diabetic retinopathy ([place])   . DM (diabetes mellitus), type 2 with renal complications ([place])   . GERD (gastroesophageal reflux disease)   . Hyperlipidemia LDL goal <70   . Ischemic cardiomyopathy 11/2011   Intra-OP TEE: EF 40-45%, no regional WMA; improved Anterior WM post CABG.  . 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 BACK SURGERY  1970  . CARDIAC CATHETERIZATION  2013  . CORONARY ANGIOPLASTY  1994   OM  . CORONARY ANGIOPLASTY  1997   LAD  . CORONARY ANGIOPLASTY WITH STENT PLACEMENT  1999   RCA  . CORONARY ANGIOPLASTY WITH STENT PLACEMENT  2001   RCA  . CORONARY ARTERY BYPASS GRAFT   12/27/2011   Procedure: CORONARY ARTERY BYPASS GRAFTING (CABG);  Surgeon: Rexene Alberts, MD;  Location: [facility];  Service: Open Heart Surgery;  Laterality: N/A;  Times four. On pump. Using endoscopically harvested right greater saphenous vein and left internal mammary artery.   [name] HERNIA REPAIR    . INCISION AND DRAINAGE OF WOUND  2006   [place]  . INTRAOPERATIVE TRANSESOPHAGEAL ECHOCARDIOGRAM  12/27/2011   Global hypokinesis with EF of 40-45%, improved LAD distribution wall motion.  [name] LEFT HEART CATHETERIZATION WITH CORONARY ANGIOGRAM N/A 12/13/2011   Procedure: LEFT HEART CATHETERIZATION WITH CORONARY ANGIOGRAM;  Surgeon: Leonie Man, MD;  Location[facility] CATH LAB;  Service: Cardiovascular;  Laterality: N/A;  . LUMBAR FUSION    . MASS EXCISION  10/24/11   R arm  . TRANSESOPHAGEAL ECHOCARDIOGRAM  2013    There were no vitals filed for this visit.  Subjective Assessment - 05/27/18 1332    Subjective   S: My fingers are still hurting and catching occasionally    Pertinent History  Pt is a 72 y/o male s/p right middle and ring finger trigger finger release on 01/29/2018. Pt reports continued  pain, stiffness, and occasional catching at night. Pt was referred to occupational therapy for evaluation and treatment by Dr. Iran Planas.     Patient Stated Goals  To be able to use my hand more and have less pain.     Currently in Pain?  No/denies        [facility] OT Assessment - 05/27/18 1259      Assessment   Medical Diagnosis  s/p right middle and ring trigger finger releases    Referring Provider  Dr. Iran Planas    Onset Date/Surgical Date  01/29/18    Hand Dominance  Left    Prior Therapy  None      Precautions   Precautions  None      Balance Screen   Has the patient fallen in the past 6 months  No    Has the patient had a decrease in activity level because of a fear of falling?   No    Is the patient reluctant to leave their home because of a fear of falling?   No      Prior Function    Leisure  [employer]-raises and sells honey      ADL   ADL comments  Pt is having difficulty with ADLs due to pain in the right hand. Pt is able to complete tasks via working through the pain. Pt has difficulty with holding and maintaining grasp on items.      Written Expression   Dominant Hand  Left      Cognition   Overall Cognitive Status  Within Functional Limits for tasks assessed      ROM / Strength   AROM / PROM / Strength  Strength      Palpation   Palpation comment  pt with moderate scar tissue and fascial restrictions along scar at base of metacarpals on right middle and ring fingers      Strength   Strength Assessment Site  Hand    Right/Left hand  Right;Left    Right Hand Gross Grasp  Functional    Right Hand Grip (lbs)  35    Right Hand Lateral Pinch  15 lbs    Right Hand 3 Point Pinch  11 lbs    Left Hand Gross Grasp  Functional    Left Hand Grip (lbs)  42    Left Hand Lateral Pinch  13 lbs    Left Hand 3 Point Pinch  11 lbs                      OT Education - 05/27/18 1342    Education Details  tendon glides, red theraputty    Person(s) Educated  Patient    Methods  Explanation;Demonstration;Handout    Comprehension  Verbalized understanding;Returned demonstration       OT Short Term Goals - 05/27/18 1349      OT SHORT TERM GOAL #1   Title  Pt will be provided with HEP to improve mobility and strength required in the right hand for functional tasks.     Time  4    Period  Weeks    Status  New    Target Date  06/26/18      OT SHORT TERM GOAL #2   Title  Pt will decrease scar tissue/fascial restrictions in right hand to minimal amounts or less to improve mobility required for grasping/holding objects.     Time  4    Period  Weeks    Status  New      OT SHORT TERM GOAL #3   Title  Pt will decrease pain in right hand to 3/10 or less to improve ability to sleep.     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     Time  4    Period  Weeks    Status  New               Plan - 05/27/18 1344    Clinical Impression Statement  A: Pt is a 72 y/o male s/p right middle and ring finger trigger finger releases on 01/29/18. Pt presents with functional limitations of right hand use during ADLs due to continued pain with use of the right hand. Pt has a high copay therefore will attend therapy 1x/week and be provided with HEP.     Occupational Profile and client history currently impacting functional performance  Pt is independent and motivated to return to highest level of functioning using right hand as non-dominant.     Occupational performance deficits (Please refer to evaluation for details):  ADL's;IADL's;Rest and Sleep;Work;Leisure    Rehab Potential  Good    OT Frequency  1x / week    OT Duration  4 weeks    OT Treatment/Interventions  Self-care/ADL training;Therapeutic exercise;Ultrasound;Manual Therapy;Therapeutic activities;Paraffin;Cryotherapy;Electrical Stimulation;Moist Heat;Passive range of motion;Patient/family education    Plan  P: Pt will benefit from skilled OT services to decrease pain, fascial restrictions, scar tissue, and increase strength and functional use of right hand during ADL completion. Treatment plan: myofascial release, scar tissue mobilization, manual therapy, ROM, grip and pinch strengthening, modalities as needed    Clinical Decision Making  Limited treatment options, no task modification necessary    OT Home Exercise Plan  8/27: tendon glides, red theraputty    Consulted and Agree with Plan of Care  Patient       Patient will benefit from skilled therapeutic intervention in order to improve the following deficits and impairments:  Decreased activity tolerance, Decreased strength, Impaired flexibility, Pain, Increased fascial restrictions, Impaired  UE functional use  Visit Diagnosis: Pain in right finger(s)  Other symptoms and signs involving the musculoskeletal system    Problem List Patient Active Problem List   Diagnosis Date Noted  . 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 Diabetes mellitus without complication ([place]) 23/53/6144  . Cervicalgia 03/08/2014  . Whiplash injury to neck 03/08/2014  . Aortic heart murmur 10/18/2013  . Acute myocardial infarction, History of:   [name] Benign essential HTN   . Hyperlipidemia LDL goal <70   . Anemia 04/23/2013  . 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 Ischemic cardiomyopathy 11/30/2011  . Actinic keratosis, left scalp 10/04/2011  . Seborrheic keratosis, right lateral leg 10/04/2011   Guadelupe Sabin, OTR/L  [phone] 05/27/2018, 1:52 PM  [place], [name], [address] Phone: [phone]   Fax:  [phone]  Name: Bradley Black MRN: [id number] Date of Birth: March 27, 1946

## 2018-06-06 ENCOUNTER — Encounter (HOSPITAL_COMMUNITY): Payer: Self-pay

## 2018-06-06 ENCOUNTER — Ambulatory Visit (HOSPITAL_COMMUNITY): Payer: Medicare Other | Attending: Orthopedic Surgery

## 2018-06-06 DIAGNOSIS — R29898 Other symptoms and signs involving the musculoskeletal system: Secondary | ICD-10-CM | POA: Diagnosis not present

## 2018-06-06 DIAGNOSIS — M79644 Pain in right finger(s): Secondary | ICD-10-CM | POA: Diagnosis not present

## 2018-06-06 NOTE — Therapy (Signed)
[place], [name], [address] Phone: [phone]   Fax:  [phone]  Occupational Therapy Treatment  Patient Details  Name: Bradley Black MRN: [id number] Date of Birth: 1946-03-21 Referring Provider: Dr. Iran Planas   Encounter Date: 06/06/2018  OT End of Session - 06/06/18 1725    Visit Number  2    Number of Visits  5    Date for OT Re-Evaluation  06/26/18    Authorization Type  UHC Medicare; $40 copay    OT Start Time  1645    OT Stop Time  1730    OT Time Calculation (min)  45 min    Activity Tolerance  Patient tolerated treatment well    Behavior During Therapy  [facility] for tasks assessed/performed       Past Medical History:  Diagnosis Date  . Acute myocardial infarction, History of: 1988   PTCA of OM; PTCA   . Benign essential HTN    right shoulder  . BPH (benign prostatic hyperplasia)   . CAD S/P percutaneous coronary angioplasty    a. PTCA of [place] b. PCI with BMS to LAD in 1997 c. RCA PCI [place] d. s/p CABG in 11/2011 with LIMA-LAD, SVG-PDA, SVG-OM2, and SVG-D1  . Chronic back pain   . CKD (chronic kidney disease), stage III ([place])   . Cyst of bursa    R shoulder  . Diabetic retinopathy ([place])   . DM (diabetes mellitus), type 2 with renal complications ([place])   . GERD (gastroesophageal reflux disease)   . Hyperlipidemia LDL goal <70   . Ischemic cardiomyopathy 11/2011   Intra-OP TEE: EF 40-45%, no regional WMA; improved Anterior WM post CABG.  . 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 BACK SURGERY  1970  . CARDIAC CATHETERIZATION  2013  . CORONARY ANGIOPLASTY  1994   OM  . CORONARY ANGIOPLASTY  1997   LAD  . CORONARY ANGIOPLASTY WITH STENT PLACEMENT  1999   RCA  . CORONARY ANGIOPLASTY WITH STENT PLACEMENT  2001   RCA  . CORONARY ARTERY BYPASS GRAFT   12/27/2011   Procedure: CORONARY ARTERY BYPASS GRAFTING (CABG);  Surgeon: Rexene Alberts, MD;  Location: [facility];  Service: Open Heart Surgery;  Laterality: N/A;  Times four. On pump. Using endoscopically harvested right greater saphenous vein and left internal mammary artery.   [name] HERNIA REPAIR    . INCISION AND DRAINAGE OF WOUND  2006   axilla  . INTRAOPERATIVE TRANSESOPHAGEAL ECHOCARDIOGRAM  12/27/2011   Global hypokinesis with EF of 40-45%, improved LAD distribution wall motion.  [name] LEFT HEART CATHETERIZATION WITH CORONARY ANGIOGRAM N/A 12/13/2011   Procedure: LEFT HEART CATHETERIZATION WITH CORONARY ANGIOGRAM;  Surgeon: Leonie Man, MD;  Location: [facility] CATH LAB;  Service: Cardiovascular;  Laterality: N/A;  . LUMBAR FUSION    . MASS EXCISION  10/24/11   R arm  . TRANSESOPHAGEAL ECHOCARDIOGRAM  2013    There were no vitals filed for this visit.  Subjective Assessment - 06/06/18 1725    Subjective   S: It will hurt when I'm using it or sometimes I could be doing nothing and it'll just hurt.     Currently in Pain?  No/denies  [facility] OT Assessment - 06/06/18 1705      Assessment   Medical Diagnosis  s/p right middle and ring trigger finger releases      Precautions   Precautions  None               OT Treatments/Exercises (OP) - 06/06/18 1709      Exercises   Exercises  Hand;Theraputty      Hand Exercises   MCPJ Flexion  AROM;5 reps    MCPJ Extension  PROM;5 reps    PIPJ Flexion  AROM;5 reps    PIPJ Extension  PROM;5 reps    Hand Gripper with Large Beads  all beads with gripper set at 37#    Hand Gripper with Medium Beads  all beads with gripper set at 37#    Hand Gripper with Small Beads  all beads with gripper set at 29#    Sponges  high resistance: 9, 10      Theraputty   Theraputty - Flatten  red- standing. focused on finger extension      Manual Therapy   Manual Therapy  Soft tissue mobilization    Manual therapy comments  Manual therapy completed  prior to exercises    Soft tissue mobilization  soft tissue mobilization and stretches completed to right middle and ring finger to decrease scar tissue and fascial restrictions and increase joint mobiility in a pain free zone.              OT Education - 06/06/18 1723    Education Details  reviewed goals and provided patient with print out. Encouraged patient to use rubberband around fingers to work on digit extension multiple times during the day.    Person(s) Educated  Patient    Methods  Explanation;Demonstration;Handout    Comprehension  Verbalized understanding       OT Short Term Goals - 06/06/18 1725      OT SHORT TERM GOAL #1   Title  Pt will be provided with HEP to improve mobility and strength required in the right hand for functional tasks.     Time  4    Period  Weeks    Status  On-going      OT SHORT TERM GOAL #2   Title  Pt will decrease scar tissue/fascial restrictions in right hand to minimal amounts or less to improve mobility required for grasping/holding objects.     Time  4    Period  Weeks    Status  On-going      OT SHORT TERM GOAL #3   Title  Pt will decrease pain in right hand to 3/10 or less to improve ability to sleep.     Time  4    Period  Weeks    Status  On-going      OT SHORT TERM GOAL #4   Title  Pt will improve grip strength by 4# to improve ability to perform bee-keeping tasks using right hand as non-dominant.     Time  4    Period  Weeks    Status  On-going               Plan - 06/06/18 1733    Clinical Impression Statement  A: Initiated myofascial release and joint and scar mobilization to right hand palm along scar and volar aspect of middle and ring finger. Passive stretching completed followed by grip strengthening exercises. patient did have good response to scar release with slight muscle  fatigue during grip strengthening task.    Plan  P: Continue with scar mobilization and myofascial release to palm, middle, and ring  finger. Continue with grip strengthening attempting to increase gripper strength for small beads if able.     Consulted and Agree with Plan of Care  Patient       Patient will benefit from skilled therapeutic intervention in order to improve the following deficits and impairments:  Decreased activity tolerance, Decreased strength, Impaired flexibility, Pain, Increased fascial restrictions, Impaired UE functional use  Visit Diagnosis: Pain in right finger(s)  Other symptoms and signs involving the musculoskeletal system    Problem List Patient Active Problem List   Diagnosis Date Noted  . 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 Diabetes mellitus without complication ([place]) 58/30/9407  . Cervicalgia 03/08/2014  . Whiplash injury to neck 03/08/2014  . Aortic heart murmur 10/18/2013  . Acute myocardial infarction, History of:   [name] Benign essential HTN   . Hyperlipidemia LDL goal <70   . Anemia 04/23/2013  . 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 Ischemic cardiomyopathy 11/30/2011  . Actinic keratosis, left scalp 10/04/2011  . Seborrheic keratosis, right lateral leg 10/04/2011   Ailene Ravel, OTR/L,CBIS  [phone]  06/06/2018, 5:52 PM  [place] [facility] [address] [name], [name], [address] Phone: [phone]   Fax:  [phone]  Name: Bradley Black MRN: [id number] Date of Birth: August 01, 1946

## 2018-06-09 ENCOUNTER — Encounter (HOSPITAL_COMMUNITY): Payer: Self-pay | Admitting: Specialist

## 2018-06-09 ENCOUNTER — Ambulatory Visit (HOSPITAL_COMMUNITY): Payer: Medicare Other | Admitting: Specialist

## 2018-06-09 ENCOUNTER — Other Ambulatory Visit: Payer: Self-pay | Admitting: Family Medicine

## 2018-06-09 DIAGNOSIS — R29898 Other symptoms and signs involving the musculoskeletal system: Secondary | ICD-10-CM | POA: Diagnosis not present

## 2018-06-09 DIAGNOSIS — M79644 Pain in right finger(s): Secondary | ICD-10-CM

## 2018-06-09 NOTE — Therapy (Addendum)
[place], [name], [address] Phone: [phone]   Fax:  [phone]  Occupational Therapy Treatment  Patient Details  Name: Bradley Black MRN: [id number] Date of Birth: Feb 16, 1946 Referring Provider: Dr. Iran Planas   Encounter Date: 06/09/2018  OT End of Session - 06/09/18 2220    Visit Number  3    Number of Visits  5    Date for OT Re-Evaluation  06/26/18    Authorization Type  [employer]HC Medicare; $40 copay    OT Start Time  1032    OT Stop Time  1115    OT Time Calculation (min)  43 min    Activity Tolerance  Patient tolerated treatment well    Behavior During Therapy  [facility] for tasks assessed/performed       Past Medical History:  Diagnosis Date  . Acute myocardial infarction, History of: 1988   PTCA of OM; PTCA   . Benign essential HTN    right shoulder  . BPH (benign prostatic hyperplasia)   . CAD S/P percutaneous coronary angioplasty    a. PTCA of [place] b. PCI with BMS to LAD in 1997 c. RCA PCI [place] d. s/p CABG in 11/2011 with LIMA-LAD, SVG-PDA, SVG-OM2, and SVG-D1  . Chronic back pain   . CKD (chronic kidney disease), stage III ([place])   . Cyst of bursa    R shoulder  . Diabetic retinopathy ([place])   . DM (diabetes mellitus), type 2 with renal complications ([place])   . GERD (gastroesophageal reflux disease)   . Hyperlipidemia LDL goal <70   . Ischemic cardiomyopathy 11/2011   Intra-OP TEE: EF 40-45%, no regional WMA; improved Anterior WM post CABG.  . 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 BACK SURGERY  1970  . CARDIAC CATHETERIZATION  2013  . CORONARY ANGIOPLASTY  1994   OM  . CORONARY ANGIOPLASTY  1997   LAD  . CORONARY ANGIOPLASTY WITH STENT PLACEMENT  1999   RCA  . CORONARY ANGIOPLASTY WITH STENT PLACEMENT  2001   RCA  . CORONARY ARTERY BYPASS GRAFT   12/27/2011   Procedure: CORONARY ARTERY BYPASS GRAFTING (CABG);  Surgeon: Rexene Alberts, MD;  Location: [facility];  Service: Open Heart Surgery;  Laterality: N/A;  Times four. On pump. Using endoscopically harvested right greater saphenous vein and left internal mammary artery.   [name] HERNIA REPAIR    . INCISION AND DRAINAGE OF WOUND  2006   axilla  . INTRAOPERATIVE TRANSESOPHAGEAL ECHOCARDIOGRAM  12/27/2011   Global hypokinesis with EF of 40-45%, improved LAD distribution wall motion.  [name] LEFT HEART CATHETERIZATION WITH CORONARY ANGIOGRAM N/A 12/13/2011   Procedure: LEFT HEART CATHETERIZATION WITH CORONARY ANGIOGRAM;  Surgeon: Leonie Man, MD;  Location: [facility] CATH LAB;  Service: Cardiovascular;  Laterality: N/A;  . LUMBAR FUSION    . MASS EXCISION  10/24/11   R arm  . TRANSESOPHAGEAL ECHOCARDIOGRAM  2013    There were no vitals filed for this visit.  Subjective Assessment - 06/09/18 2219    Subjective   S: It loosens up after my exercises and then just gets tight again.      Currently in Pain?  Yes    Pain Score  3     Pain  Location  Hand    Pain Orientation  Right    Pain Descriptors / Indicators  Aching         [facility] OT Assessment - 06/09/18 0001      Assessment   Medical Diagnosis  s/p right middle and ring trigger finger releases      Precautions   Precautions  None               OT Treatments/Exercises (OP) - 06/09/18 0001      Exercises   Exercises  Hand;Theraputty      Hand Exercises   MCPJ Flexion  AROM;PROM;5 reps    MCPJ Extension  AROM;PROM;5 reps    PIPJ Flexion  PROM;AROM;5 reps    PIPJ Extension  AROM;PROM;5 reps    Joint Blocking Exercises  long and ring finger 5 times each a/rom     Hand Gripper with Large Beads  all beads with gripper set at 42#    Hand Gripper with Medium Beads  all beads with gripper set at 42#    Hand Gripper with Small Beads  all beads with gripper set at 42#    Sponges  regular resistance:  22, 29      Theraputty    Theraputty - Flatten  red- standing. focused on finger extension  pvc pipe pull     Theraputty - Roll  red - seated    Theraputty - Grip  red supinated and pronated grip    Theraputty Hand- Locate Pegs  10 beads locating iwth long and ring finger with max difficulty and moderate verbal cues       Manual Therapy   Manual Therapy  Myofascial release    Manual therapy comments  manual therapy completed seperately from all other interventions this date of service    Soft tissue mobilization  myofascial release and scar release to left palm, surgical scar, and associated areas to decrease pain and fascial/scar restrictions for improved moblility in right hand.       Fine Motor Coordination (Hand/Wrist)   Fine Motor Coordination  Tendon glides    Tendon Glides  10 times               OT Short Term Goals - 06/06/18 1725      OT SHORT TERM GOAL #1   Title  Pt will be provided with HEP to improve mobility and strength required in the right hand for functional tasks.     Time  4    Period  Weeks    Status  On-going      OT SHORT TERM GOAL #2   Title  Pt will decrease scar tissue/fascial restrictions in right hand to minimal amounts or less to improve mobility required for grasping/holding objects.     Time  4    Period  Weeks    Status  On-going      OT SHORT TERM GOAL #3   Title  Pt will decrease pain in right hand to 3/10 or less to improve ability to sleep.     Time  4    Period  Weeks    Status  On-going      OT SHORT TERM GOAL #4   Title  Pt will improve grip strength by 4# to improve ability to perform bee-keeping tasks using right hand as non-dominant.     Time  4    Period  Weeks    Status  On-going  Plan - 06/09/18 2220    Clinical Impression Statement  A:  good vasomotor response and improved tissue mobility with myofascial release and scar release this date.  Able to pick up beads with 42# gripper, which demonstrates improved grip strength  from last session.      Plan  P:  Attempt ultrasound for scar mobilization, improve independence with locating beads in theraputty.         Patient will benefit from skilled therapeutic intervention in order to improve the following deficits and impairments:  Decreased activity tolerance, Decreased strength, Impaired flexibility, Pain, Increased fascial restrictions, Impaired UE functional use  Visit Diagnosis: Pain in right finger(s)  Other symptoms and signs involving the musculoskeletal system    Problem List Patient Active Problem List   Diagnosis Date Noted  . 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 Diabetes mellitus without complication ([place]) 00/01/566  . Cervicalgia 03/08/2014  . Whiplash injury to neck 03/08/2014  . Aortic heart murmur 10/18/2013  . Acute myocardial infarction, History of:   [name] Benign essential HTN   . Hyperlipidemia LDL goal <70   . Anemia 04/23/2013  . 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 Ischemic cardiomyopathy 11/30/2011  . Actinic keratosis, left scalp 10/04/2011  . Seborrheic keratosis, right lateral leg 10/04/2011    Vangie Bicker, MHA, OTR/L [phone] OCCUPATIONAL THERAPY DISCHARGE SUMMARY 08/01/18  Visits from Start of Care: 3  Current functional level related to goals / functional outcomes: See above   Remaining deficits: See above   Education / Equipment: See above  Plan: Patient agrees to discharge.  Patient goals were partially met. Patient is being discharged due to not returning since the last visit.  ?????  Vangie Bicker, [place], OTR/L [phone]     06/09/2018, 10:25 PM  [place], [name], [address] Phone: [phone]   Fax:  [phone]  Name: Bradley Black MRN: [id number] Date of Birth: [birth year]

## 2018-06-26 ENCOUNTER — Telehealth (HOSPITAL_COMMUNITY): Payer: Self-pay | Admitting: Family Medicine

## 2018-06-26 NOTE — Telephone Encounter (Signed)
06/26/18  pt left a message to cx said he would call back to reschedule

## 2018-06-27 ENCOUNTER — Ambulatory Visit (HOSPITAL_COMMUNITY): Payer: Medicare Other | Admitting: Occupational Therapy

## 2018-06-30 ENCOUNTER — Ambulatory Visit (INDEPENDENT_AMBULATORY_CARE_PROVIDER_SITE_OTHER): Payer: Medicare Other | Admitting: Physician Assistant

## 2018-06-30 ENCOUNTER — Telehealth (HOSPITAL_COMMUNITY): Payer: Self-pay

## 2018-06-30 ENCOUNTER — Encounter: Payer: Self-pay | Admitting: Physician Assistant

## 2018-06-30 VITALS — BP 132/60 | HR 56 | Temp 97.7°F | Resp 18 | Ht 68.0 in | Wt 159.0 lb

## 2018-06-30 DIAGNOSIS — B029 Zoster without complications: Secondary | ICD-10-CM

## 2018-06-30 MED ORDER — VALACYCLOVIR HCL 1 G PO TABS: 1000.0000 mg | ORAL_TABLET | Freq: Three times a day (TID) | ORAL | 0 refills | Status: AC

## 2018-06-30 MED ORDER — HYDROCODONE-ACETAMINOPHEN 5-325 MG PO TABS: 1.0000 | ORAL_TABLET | Freq: Four times a day (QID) | ORAL | 0 refills | Status: DC | PRN

## 2018-06-30 MED ORDER — GABAPENTIN 300 MG PO CAPS: ORAL_CAPSULE | ORAL | 3 refills | Status: DC

## 2018-06-30 NOTE — Telephone Encounter (Signed)
He has shingles and wants to be put on hold he will call back to r/s

## 2018-06-30 NOTE — Progress Notes (Signed)
Patient ID: Bradley Black MRN: [id number], DOB: 26-Jun-1946, 72 y.o. Date of Encounter: 06/30/2018, 10:15 AM    Chief Complaint:  Chief Complaint  Patient presents with  . Rash    right side painful      HPI: 72 y.o. year old male presents with above.   Reports that he first noticed something going on on Thursday, September 26. Says that--- at that time ---he noticed that there was an area on his right lateral side--- at level of waistline-- that felt itchy/irritated. States that since then, he started to develop some rash and has become painful. Reports that he has had shingles in the past and this is the same as his prior shingles.  Rash is similar and the pain feels the same.     Home Meds:   Outpatient Medications Prior to Visit  Medication Sig Dispense Refill  . amLODipine-olmesartan (AZOR) 5-20 MG tablet     . aspirin EC 81 MG tablet Take 81 mg by mouth daily.    . carbamide peroxide (DEBROX) 6.5 % OTIC solution Place 5 drops into both ears 2 (two) times daily as needed (for blocked ears). 15 mL 0  . citalopram (CELEXA) 10 MG tablet TAKE ONE (1) TABLET EACH DAY 90 tablet 3  . doxazosin (CARDURA) 4 MG tablet TAKE ONE (1) TABLET BY MOUTH EVERY DAY 90 tablet 3  . JANUVIA 50 MG tablet TAKE 1 TABLET BY MOUTH EVERY DAY 90 tablet 0  . pantoprazole (PROTONIX) 40 MG tablet TAKE ONE TABLET TWICE DAILY 180 tablet 3  . pravastatin (PRAVACHOL) 20 MG tablet TAKE ONE (1) TABLET BY MOUTH EVERY DAY 90 tablet 3  . sildenafil (VIAGRA) 100 MG tablet Take 0.5-1 tablets (50-100 mg total) by mouth daily as needed for erectile dysfunction. 60 tablet 5  . sodium bicarbonate 650 MG tablet Take 650 mg by mouth 3 (three) times daily.     No facility-administered medications prior to visit.     Allergies: No Known Allergies    Review of Systems: See HPI for pertinent ROS. All other ROS negative.    Physical Exam: Blood pressure 132/60, pulse (!) 56, temperature 97.7 F (36.5 C),  temperature source Oral, resp. rate 18, height 5\' 8"  (1.727 m), weight 72.1 kg, SpO2 98 %., Body mass index is 24.18 kg/m. General: WNWD WM.  Appears in no acute distress. Neck: Supple. No thyromegaly. No lymphadenopathy. Lungs: Clear bilaterally to auscultation without wheezes, rales, or rhonchi. Breathing is unlabored. Heart: Regular rhythm. No murmurs, rubs, or gallops. Msk:  Strength and tone normal for age. Skin: Right flank at level of waistline----light pink erythema--- papulovesicular rash--- splotchy--- approximate 1.5 inch diameter area. Neuro: Alert and oriented X 3. Moves all extremities spontaneously. Gait is normal. CNII-XII grossly in tact. Psych:  Responds to questions appropriately with a normal affect.     ASSESSMENT AND PLAN:  72 y.o. year old male with   1. Herpes zoster without complication He is to take the Valtrex as directed. He is to take the gabapentin as directed.  Once he gets to taking 1 3 times daily if symptoms not controlled at that dose then he is to call here for further instruction on how to further titrate dose.  So discussed with him that he can determine how long he needs to be on the medication depending on duration of his pain.  He voices understanding and agrees. Also discussed that I am giving him the hydrocodone to have on available  if needed for breakthrough pain. - valACYclovir (VALTREX) 1000 MG tablet; Take 1 tablet (1,000 mg total) by mouth 3 (three) times daily for 7 days.  Dispense: 21 tablet; Refill: 0 - gabapentin (NEURONTIN) 300 MG capsule; Day 1: Take 1 daily.  Day 2: Take 1 twice a day.  Then go to one 3 times daily starting on day 3.  Dispense: 90 capsule; Refill: 3 - HYDROcodone-acetaminophen (NORCO/VICODIN) 5-325 MG tablet; Take 1 tablet by mouth every 6 (six) hours as needed.  Dispense: 30 tablet; Refill: 0   Signed, [address] [place], [place], [facility] 06/30/2018 10:15 AM

## 2018-07-01 ENCOUNTER — Ambulatory Visit (HOSPITAL_COMMUNITY): Payer: Medicare Other

## 2018-07-02 ENCOUNTER — Telehealth: Payer: Self-pay

## 2018-07-02 NOTE — Telephone Encounter (Signed)
Call placed to patient he is aware of provider recommendations

## 2018-07-02 NOTE — Telephone Encounter (Signed)
Bradley Black, Patient wife called and states patient is having side effects with the medication that was prescribed on 9/30. I called to get a little more information and spoke with the  Patient, and he states the medication is causing him to become real drowsy and dizzy. Patient states he take both the gabapentin and valtrex at bedtime.  Please advise on further treatment for the patient

## 2018-07-02 NOTE — Telephone Encounter (Signed)
It is most likely the gabapentin that is causing those symptoms. Have him stop the gabapentin. But continue the Valtrex. The symptoms may persist even tomorrow -- secondary to the gabapentin he has already had-- but otherwise if the symptoms worsen or persist beyond tomorrow-- then let us know.

## 2018-07-30 ENCOUNTER — Other Ambulatory Visit: Payer: Self-pay | Admitting: Family Medicine

## 2018-08-21 DIAGNOSIS — Z79899 Other long term (current) drug therapy: Secondary | ICD-10-CM | POA: Diagnosis not present

## 2018-08-21 DIAGNOSIS — N184 Chronic kidney disease, stage 4 (severe): Secondary | ICD-10-CM | POA: Diagnosis not present

## 2018-08-21 DIAGNOSIS — R809 Proteinuria, unspecified: Secondary | ICD-10-CM | POA: Diagnosis not present

## 2018-08-21 DIAGNOSIS — I10 Essential (primary) hypertension: Secondary | ICD-10-CM | POA: Diagnosis not present

## 2018-08-21 DIAGNOSIS — E1129 Type 2 diabetes mellitus with other diabetic kidney complication: Secondary | ICD-10-CM | POA: Diagnosis not present

## 2018-08-25 ENCOUNTER — Other Ambulatory Visit: Payer: Self-pay | Admitting: Family Medicine

## 2018-08-27 ENCOUNTER — Encounter: Payer: Self-pay | Admitting: Student

## 2018-08-27 ENCOUNTER — Ambulatory Visit: Payer: Medicare Other | Admitting: Student

## 2018-08-27 VITALS — BP 130/58 | HR 61 | Ht 68.0 in | Wt 160.6 lb

## 2018-08-27 DIAGNOSIS — E785 Hyperlipidemia, unspecified: Secondary | ICD-10-CM

## 2018-08-27 DIAGNOSIS — I25810 Atherosclerosis of coronary artery bypass graft(s) without angina pectoris: Secondary | ICD-10-CM | POA: Diagnosis not present

## 2018-08-27 DIAGNOSIS — I10 Essential (primary) hypertension: Secondary | ICD-10-CM | POA: Diagnosis not present

## 2018-08-27 DIAGNOSIS — N184 Chronic kidney disease, stage 4 (severe): Secondary | ICD-10-CM | POA: Diagnosis not present

## 2018-08-27 DIAGNOSIS — R001 Bradycardia, unspecified: Secondary | ICD-10-CM | POA: Diagnosis not present

## 2018-08-27 NOTE — Patient Instructions (Addendum)
Medication Instructions:  Your physician recommends that you continue on your current medications as directed. Please refer to the Current Medication list given to you today.  If you need a refill on your cardiac medications before your next appointment, please call your pharmacy.   Lab work: Your physician recommends that you return for lab work in: today   If you have labs (blood work) drawn today and your tests are completely normal, you will receive your results only by: [name] MyChart Message (if you have MyChart) OR . A paper copy in the mail If you have any lab test that is abnormal or we need to change your treatment, we will call you to review the results.  Testing/Procedures: Your physician has recommended that you wear an event monitor. Event monitors are medical devices that record the heart's electrical activity. Doctors most often [place] these monitors to diagnose arrhythmias. Arrhythmias are problems with the speed or rhythm of the heartbeat. The monitor is a small, portable device. You can wear one while you do your normal daily activities. This is usually used to diagnose what is causing palpitations/syncope (passing out).    Follow-Up: At [facility], you and your health needs are our priority.  As part of our continuing mission to provide you with exceptional heart care, we have created designated Provider Care Teams.  These Care Teams include your primary Cardiologist (physician) and Advanced Practice Providers (APPs -  Physician Assistants and Nurse Practitioners) who all work together to provide you with the care you need, when you need it. You will need a follow up appointment in 5 months.  Please call our office 2 months in advance to schedule this appointment.  You may see Kate Sable, MD or one of the following Advanced Practice Providers on your designated Care Team:   Bernerd Pho, PA-C [facility]) . Ermalinda Barrios, PA-C ([place])  Any Other  Special Instructions Will Be Listed Below (If Applicable). Thank you for choosing [facility]!

## 2018-08-27 NOTE — Progress Notes (Signed)
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 Follow-up    add-on visit for bradycardia per Nephrology    History of Present Illness:    Bradley Black is a 72 y.o. male with past medical history of CAD (multiple prior interventions with subsequent CABG in 11/2011 with LIMA-LAD, SVG-PDA, SVG-OM2, and SVG-D1), chronic combined systolic and diastolic CHF (EF 32% by echocardiogram in 11/2015), baseline bradycardia, HTN, HLD, Type 2 DM, and Stage 4 CKD who presents to the office today for evaluation of bradycardia.  He was last examined by myself in 12/2017 and reported being very active at baseline and denied any associated chest discomfort or dyspnea with this. Heart rate was 58 during his office visit which is close to his known baseline. He was continued on his current medication regimen at that time and informed to follow-up in one year.   He was examined by Dr. Lowanda Foster earlier this morning and was noted to be bradycardic with heart rate at 34 during his office visit. It is unclear if this was rechecked at that time. The notes received from the office visit did not mention anything about him being symptomatic. Close follow-up with Cardiology was recommended.  In talking with the patient today, he reports overall being asymptomatic with his bradycardia at the time of his office visit earlier this morning. No associated lightheadedness, dizziness, or presyncope. He does report having fatigue over the past several months and is unsure if this has changed recently. 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 No recent chest pain, palpitations, orthopnea, PND, or lower extremity edema.  He does not check his blood pressure regularly at home but it is well  controlled at 130/58 during today's visit.   Past Medical History:  Diagnosis Date  . Acute myocardial infarction, History of: 1988   PTCA of OM; PTCA   . Benign essential HTN    right shoulder  . BPH (benign prostatic hyperplasia)   . CAD S/P percutaneous coronary angioplasty    a. PTCA of [place] b. PCI with BMS to LAD in 1997 c. RCA PCI [place] d. s/p CABG in 11/2011 with LIMA-LAD, SVG-PDA, SVG-OM2, and SVG-D1  . Chronic back pain   . CKD (chronic kidney disease), stage III ([place])   . Cyst of bursa    R shoulder  . Diabetic retinopathy ([place])   . DM (diabetes mellitus), type 2 with renal complications ([place])   . GERD (gastroesophageal reflux disease)   . Hyperlipidemia LDL goal <70   . Ischemic cardiomyopathy 11/2011   Intra-OP TEE: EF 40-45%, no regional WMA; improved Anterior WM post CABG.  . 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 BACK SURGERY  1970  . CARDIAC CATHETERIZATION  2013  . CORONARY ANGIOPLASTY  1994   OM  . CORONARY ANGIOPLASTY  1997   LAD  . CORONARY ANGIOPLASTY WITH STENT PLACEMENT  1999   RCA  . CORONARY ANGIOPLASTY WITH STENT PLACEMENT  2001   RCA  . CORONARY ARTERY BYPASS  GRAFT  12/27/2011   Procedure: CORONARY ARTERY BYPASS GRAFTING (CABG);  Surgeon: Rexene Alberts, MD;  Location: [facility];  Service: Open Heart Surgery;  Laterality: N/A;  Times four. On pump. Using endoscopically harvested right greater saphenous vein and left internal mammary artery.   [name] HERNIA REPAIR    . INCISION AND DRAINAGE OF WOUND  2006   [place]  . INTRAOPERATIVE TRANSESOPHAGEAL ECHOCARDIOGRAM  12/27/2011   Global hypokinesis with EF of 40-45%, improved LAD distribution wall motion.  [name] LEFT HEART CATHETERIZATION WITH CORONARY ANGIOGRAM N/A 12/13/2011   Procedure: LEFT HEART CATHETERIZATION WITH CORONARY ANGIOGRAM;  Surgeon: Leonie Man, MD;   Location: [facility] CATH LAB;  Service: Cardiovascular;  Laterality: N/A;  . LUMBAR FUSION    . MASS EXCISION  10/24/11   R arm  . TRANSESOPHAGEAL ECHOCARDIOGRAM  2013    Current Medications: Outpatient Medications Prior to Visit  Medication Sig Dispense Refill  . amLODipine-olmesartan (AZOR) 5-20 MG tablet     . aspirin EC 81 MG tablet Take 81 mg by mouth daily.    . carbamide peroxide (DEBROX) 6.5 % OTIC solution Place 5 drops into both ears 2 (two) times daily as needed (for blocked ears). 15 mL 0  . citalopram (CELEXA) 10 MG tablet TAKE ONE (1) TABLET BY MOUTH EVERY DAY 90 tablet 3  . doxazosin (CARDURA) 4 MG tablet TAKE ONE (1) TABLET BY MOUTH EVERY DAY 90 tablet 3  . JANUVIA 50 MG tablet TAKE 1 TABLET BY MOUTH EVERY DAY 90 tablet 0  . pantoprazole (PROTONIX) 40 MG tablet TAKE ONE TABLET TWICE DAILY 180 tablet 3  . pravastatin (PRAVACHOL) 20 MG tablet TAKE ONE (1) TABLET BY MOUTH EVERY DAY 90 tablet 3  . sildenafil (VIAGRA) 100 MG tablet Take 0.5-1 tablets (50-100 mg total) by mouth daily as needed for erectile dysfunction. 60 tablet 5  . sodium bicarbonate 650 MG tablet Take 650 mg by mouth 3 (three) times daily.    [name] gabapentin (NEURONTIN) 300 MG capsule Day 1: Take 1 daily.  Day 2: Take 1 twice a day.  Then go to one 3 times daily starting on day 3. 90 capsule 3  . HYDROcodone-acetaminophen (NORCO/VICODIN) 5-325 MG tablet Take 1 tablet by mouth every 6 (six) hours as needed. 30 tablet 0   No facility-administered medications prior to visit.      Allergies:   Patient has no known allergies.   Social History   Socioeconomic History  . Marital status: Married    Spouse name: Not on file  . Number of children: Not on file  . Years of education: Not on file  . Highest education level: Not on file  Occupational History  . Not on file  Social Needs  . Financial resource strain: Not on file  . Food insecurity:    Worry: Not on file    Inability: Not on file  . Transportation needs:     Medical: Not on file    Non-medical: Not on file  Tobacco Use  . Smoking status: Former Smoker    Types: Cigarettes    Last attempt to quit: 10/01/1986    Years since quitting: 31.9  . Smokeless tobacco: Never Used  . Tobacco comment: quit about 30 yrs ago  Substance and Sexual Activity  . Alcohol use: No  . Drug use: No  . 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  Physical activity:    Days per week: Not on file    Minutes per session: Not on file  . Stress: Not on file  Relationships  . Social connections:    Talks on phone: Not on file    Gets together: Not on file    Attends religious service: Not on file    Active member of club or organization: Not on file    Attends meetings of clubs or organizations: Not on file    Relationship status: Not on file  Other Topics Concern  . Not on file  Social History Narrative   Married father of one grandfather 60. Works out at [employer] at [employer] roughly 2-3 days a week. He works on a treadmill. He does note having a hard time getting his heart rate up.   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   He quit smoking in 1988, and does not drink alcohol.     Family History:  The patient's family history includes Cancer in his father and mother.   Review of Systems:   Please see the history of present illness.     General:  No chills, fever, night sweats or weight changes. Positive for fatigue.  Cardiovascular:  No chest pain, dyspnea on exertion, edema, orthopnea, palpitations, paroxysmal nocturnal dyspnea. Dermatological: No rash, lesions/masses Respiratory: No cough, dyspnea Urologic: No hematuria, dysuria Abdominal:   No nausea, vomiting, diarrhea, bright red blood per rectum, melena, or hematemesis Neurologic:  No visual changes, wkns, changes in mental status. All other systems reviewed and are otherwise negative except as noted  above.   Physical Exam:    VS:  BP (!) 130/58   Pulse 61   Ht _0  (1.727 m)   Wt 160 lb 9.6 oz (72.8 kg)   SpO2 98%   BMI 24.42 kg/m    General: Well developed, well nourished Caucasian male appearing in no acute distress. Head: Normocephalic, atraumatic, sclera non-icteric, no xanthomas, nares are without discharge.  Neck: No carotid bruits. JVD not elevated.  Lungs: Respirations regular and unlabored, without wheezes or rales.  Heart: Regular rate and rhythm with occasional ectopic beats. No S3 or S4.  No murmur, no rubs, or gallops appreciated. Abdomen: Soft, non-tender, non-distended with normoactive bowel sounds. No hepatomegaly. No rebound/guarding. No obvious abdominal masses. Msk:  Strength and tone appear normal for age. No joint deformities or effusions. Extremities: No clubbing or cyanosis. No lower extremity edema.  Distal pedal pulses are 2+ bilaterally. Neuro: Alert and oriented X 3. Moves all extremities spontaneously. No focal deficits noted. Psych:  Responds to questions appropriately with a normal affect. Skin: No rashes or lesions noted  Wt Readings from Last 3 Encounters:  08/27/18 160 lb 9.6 oz (72.8 kg)  06/30/18 159 lb (72.1 kg)  03/06/18 158 lb 6.4 oz (71.8 kg)     Studies/Labs Reviewed:   EKG:  EKG is ordered today. The ekg ordered today demonstrates sinus bradycardia, HR 57, with PVC's. Inferior Q-waves and nonspecific IVCD. No acute ST changes when compared to prior tracings.   Recent Labs: 01/27/2018: Hemoglobin 11.6; Platelets 173 05/06/2018: Creatinine, Ser 2.10   Lipid Panel    Component Value Date/Time   CHOL 106 01/27/2018 0908   TRIG 63 01/27/2018 0908   HDL 37 (L) 01/27/2018 0908   CHOLHDL 2.9 01/27/2018 0908   VLDL 13 01/22/2017 1007   LDLCALC 55 01/27/2018 0908    Additional studies/ records that were reviewed today include:  Cardiac Event Monitor: 10/2015  Predominantly sinus rhythm with PVC, asymptomatic.  Isolated episode  of sinus tachycardia, HR 120 bpm.   Echocardiogram: 11/2015 Study Conclusions  - Left ventricle: The cavity size was normal. Wall thickness was   normal. The estimated ejection fraction was 45%. There is   hypokinesis and scarring of the basalinferior myocardium.   Features are consistent with a pseudonormal left ventricular   filling pattern, with concomitant abnormal relaxation and   increased filling pressure (grade 2 diastolic dysfunction). - Aortic valve: Trileaflet; mildly calcified leaflets. - Mitral valve: Calcified annulus. There was mild regurgitation. - Left atrium: The atrium was mildly dilated. - Right ventricle: Systolic function was mildly reduced. - Right atrium: Central venous pressure (est): 3 mm Hg. - Atrial septum: No defect or patent foramen ovale was identified. - Tricuspid valve: There was trivial regurgitation. - Pulmonary arteries: PA peak pressure: 30 mm Hg (S). - Pericardium, extracardiac: There was no pericardial effusion.  Impressions:  - Normal LV wall thickness with LVEF approximately 45%. There is   hypokinesis and scarring of the basal inferior wall. Grade 2   diastolic dysfunction with increased filling pressure. Mild left   atrial enlargement. MAC with mild mitral regurgitation. Mildly   sclerotic aortic valve. Mildly reduced RV contraction. Trivial   tricuspid regurgitation with PASP 30 mmHg.  Assessment:    1. Coronary artery disease involving coronary bypass graft of native heart without angina pectoris   2. Bradycardia   3. Essential hypertension   4. Hyperlipidemia LDL goal <70   5. CKD (chronic kidney disease) stage 4, GFR 15-29 ml/min (HCC)      Plan:   In order of problems listed above:  1. CAD - He is s/p CABG in 11/2011 with LIMA-LAD, SVG-PDA, SVG-OM2, and SVG-D1. He is very active at baseline and denies any recent chest pain or dyspnea on exertion. Has noticed worsening fatigue over the past several months and will plan to  obtain an event monitor as outlined below. If monitor does not demonstrate variability in his heart rate, would plan to obtain a GXT to assess for chronotropic incompetence. - Continue ASA and Pravastatin. He is not on beta-blocker therapy given baseline bradycardia.  2. Bradycardia - He has a history of baseline bradycardia but was told during his Nephrology visit today that his heart rate was 34. He reports this was confirmed by manual pulse check. He does have occasional PVC's by review of his EKG and I am curious if this might have contributed to his bradycardia at that time as an EKG was not obtained. EKG this afternoon shows sinus bradycardia with PVC's but no evidence of heart block. He does report worsening fatigue but denies any lightheadedness, dizziness, or presyncope. Will plan to obtain a 30-day cardiac event monitor to rule out any significant arrhythmias. Pending the variability of his heart rate, can consider a GXT as outlined above to assess for chronotropic incompetence. Continue to avoid AV nodal blocking agents. Will also add-on TSH to his labs due to fatigue and bradycardia.   3. HTN - BP is well controlled at 130/58 during today's visit. He remains on Amlodipine-Olmesartan 5-73m daily and Doxazosin 415mdaily.   4. HLD - Followed by PCP. FLP in 12/2017 showed total cholesterol 106, HDL 37, triglycerides 63, and LDL 55.  At goal of LDL less than 70. He remains on Pravastatin 20 mg daily.  5. Stage 4 CKD - followed by Dr. BeCristela BlueCreatinine stable at 2.16 when checked  on 08/21/2018.   Medication Adjustments/Labs and Tests Ordered: Current medicines are reviewed at length with the patient today.  Concerns regarding medicines are outlined above.  Medication changes, Labs and Tests ordered today are listed in the Patient Instructions below. Patient Instructions  Medication Instructions:  Your physician recommends that you continue on your current medications as directed. Please  refer to the Current Medication list given to you today.  If you need a refill on your cardiac medications before your next appointment, please call your pharmacy.   Lab work: Your physician recommends that you return for lab work in: today   If you have labs (blood work) drawn today and your tests are completely normal, you will receive your results only by: [name] MyChart Message (if you have MyChart) OR . A paper copy in the mail If you have any lab test that is abnormal or we need to change your treatment, we will call you to review the results.  Testing/Procedures: Your physician has recommended that you wear an event monitor. Event monitors are medical devices that record the heart's electrical activity. Doctors most often [place] these monitors to diagnose arrhythmias. Arrhythmias are problems with the speed or rhythm of the heartbeat. The monitor is a small, portable device. You can wear one while you do your normal daily activities. This is usually used to diagnose what is causing palpitations/syncope (passing out).  Follow-Up: At [facility], you and your health needs are our priority.  As part of our continuing mission to provide you with exceptional heart care, we have created designated Provider Care Teams.  These Care Teams include your primary Cardiologist (physician) and Advanced Practice Providers (APPs -  Physician Assistants and Nurse Practitioners) who all work together to provide you with the care you need, when you need it. You will need a follow up appointment in 5 months.  Please call our office 2 months in advance to schedule this appointment.  You may see Kate Sable, MD or one of the following Advanced Practice Providers on your designated Care Team:   Bernerd Pho, PA-C [facility]) . Ermalinda Barrios, PA-C ([place])  Any Other Special Instructions Will Be Listed Below (If Applicable). Thank you for choosing [facility]!     Signed, Erma Heritage, PA-C  08/27/2018 5:42 PM    [place] [address] [place], [place] [address] Phone: [phone]

## 2018-09-05 DIAGNOSIS — R001 Bradycardia, unspecified: Secondary | ICD-10-CM | POA: Diagnosis not present

## 2018-09-08 ENCOUNTER — Ambulatory Visit (INDEPENDENT_AMBULATORY_CARE_PROVIDER_SITE_OTHER): Payer: Medicare Other

## 2018-09-08 ENCOUNTER — Other Ambulatory Visit (HOSPITAL_COMMUNITY)
Admission: RE | Admit: 2018-09-08 | Discharge: 2018-09-08 | Disposition: A | Discharge status: Home / Self Care | Payer: Medicare Other | Source: Ambulatory Visit | Attending: Student | Admitting: Student

## 2018-09-08 DIAGNOSIS — R001 Bradycardia, unspecified: Secondary | ICD-10-CM | POA: Insufficient documentation

## 2018-09-08 LAB — TSH: TSH: 2.481 u[IU]/mL (ref 0.350–4.500)

## 2018-09-09 ENCOUNTER — Encounter (HOSPITAL_COMMUNITY): Payer: Self-pay

## 2018-09-09 NOTE — Therapy (Deleted)
[facility] [address] [place], [name], [address] Phone: [phone]   Fax:  [phone]  Patient Details  Name: Bradley Black MRN: [id number] Date of Birth: [birth year] Referring Provider:  No ref. provider found  Encounter Date: 09/09/2018  OCCUPATIONAL THERAPY DISCHARGE SUMMARY  Visits from Start of Care: 3  Current functional level related to goals / functional outcomes: OT SHORT TERM GOAL #1    Title  Pt will be provided with HEP to improve mobility and strength required in the right hand for functional tasks.     Time  4    Period  Weeks    Status  On-going        OT SHORT TERM GOAL #2   Title  Pt will decrease scar tissue/fascial restrictions in right hand to minimal amounts or less to improve mobility required for grasping/holding objects.     Time  4    Period  Weeks    Status  On-going        OT SHORT TERM GOAL #3   Title  Pt will decrease pain in right hand to 3/10 or less to improve ability to sleep.     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     Time  4    Period  Weeks    Status  On-going       Remaining deficits: Pt developed shingles and called to be placed on hold from therapy on 06/30/18. Pt has not returned or contacted clinic since 06/30/18. Pt will be discharged from clinic do to failure to return.    Education / Equipment: Animal nutritionist Plan: Patient agrees to discharge.  Patient goals were not met. Patient is being discharged due to not returning since the last visit.  ?????    Ailene Ravel, OTR/L,CBIS  [phone]        09/09/2018, 3:50 PM  [facility] [address] [place], [name], [address] Phone: [phone]   Fax:  [phone]

## 2018-09-10 ENCOUNTER — Other Ambulatory Visit: Payer: Self-pay | Admitting: Family Medicine

## 2018-09-30 DIAGNOSIS — H40053 Ocular hypertension, bilateral: Secondary | ICD-10-CM | POA: Diagnosis not present

## 2018-10-14 ENCOUNTER — Ambulatory Visit (INDEPENDENT_AMBULATORY_CARE_PROVIDER_SITE_OTHER): Payer: Medicare Other | Admitting: Family Medicine

## 2018-10-14 ENCOUNTER — Encounter: Payer: Self-pay | Admitting: Family Medicine

## 2018-10-14 VITALS — BP 136/72 | HR 58 | Temp 97.6°F | Resp 15 | Ht 68.0 in | Wt 159.4 lb

## 2018-10-14 DIAGNOSIS — R21 Rash and other nonspecific skin eruption: Secondary | ICD-10-CM | POA: Diagnosis not present

## 2018-10-14 DIAGNOSIS — N184 Chronic kidney disease, stage 4 (severe): Secondary | ICD-10-CM

## 2018-10-14 DIAGNOSIS — I10 Essential (primary) hypertension: Secondary | ICD-10-CM

## 2018-10-14 LAB — COMPLETE METABOLIC PANEL WITH GFR
AG Ratio: 1.5 (calc) (ref 1.0–2.5)
ALBUMIN MSPROF: 4 g/dL (ref 3.6–5.1)
ALT: 12 U/L (ref 9–46)
AST: 11 U/L (ref 10–35)
Alkaline phosphatase (APISO): 80 U/L (ref 40–115)
BUN/Creatinine Ratio: 17 (calc) (ref 6–22)
BUN: 38 mg/dL — AB (ref 7–25)
CALCIUM: 10 mg/dL (ref 8.6–10.3)
CO2: 22 mmol/L (ref 20–32)
CREATININE: 2.23 mg/dL — AB (ref 0.70–1.18)
Chloride: 110 mmol/L (ref 98–110)
GFR, EST NON AFRICAN AMERICAN: 28 mL/min/{1.73_m2} — AB (ref >=60)
GFR, Est African American: 32 mL/min/{1.73_m2} — ABNORMAL LOW (ref >=60)
Globulin: 2.6 g/dL (calc) (ref 1.9–3.7)
Glucose, Bld: 168 mg/dL — ABNORMAL HIGH (ref 65–99)
Potassium: 4.6 mmol/L (ref 3.5–5.3)
Sodium: 141 mmol/L (ref 135–146)
TOTAL PROTEIN: 6.6 g/dL (ref 6.1–8.1)
Total Bilirubin: 0.9 mg/dL (ref 0.2–1.2)

## 2018-10-14 MED ORDER — SILDENAFIL CITRATE 100 MG PO TABS: 50.0000 mg | ORAL_TABLET | Freq: Every day | ORAL | 5 refills | Status: DC | PRN

## 2018-10-14 MED ORDER — TRIAMCINOLONE ACETONIDE 0.1 % EX OINT: 1.0000 "application " | TOPICAL_OINTMENT | Freq: Two times a day (BID) | CUTANEOUS | 0 refills | Status: DC

## 2018-10-14 NOTE — Patient Instructions (Signed)
Apply steroid medicine to rash patches 2x a day for about 1 week, and then decrease if you can.  Hydrate skin with vaseline/petroleum jelly or cream like CeraVe  Keep a BP log and bring it in in the next 1-2 weeks.  Any chest pain or pressure, shortness of breath, passing out episodes, palpitations, or any reading over 180/100 get seen right away.

## 2018-10-14 NOTE — Progress Notes (Signed)
Patient ID: Bradley Black, male    DOB: 09/16/1946, 73 y.o.   MRN: [id number]  PCP: Susy Frizzle, MDx  Chief Complaint  Patient presents with  . Hypertension    Patient has c/o elevated blood pressure. Onset several weeks ago  . Rash    Patient has c/o itching rash to back and legs. Onset 2 weeks ago    Subjective:   Bradley Black is a 73 y.o. male, presents to clinic with CC of follow up on BP and he has an itchy rash.  Last visit with cardiology was reviewed from 08/27/2018, Pt was asymptomatic of his bradycardia and blood pressure at that visit had appeared well-controlled at 130/58  He is compliant with medications, no SE, no near syncope, CP, HA, LE edema, SOB.  He continues to take amlodipine on losartan 5-20 mg and doxazosin 4 mg daily.  He does not regularly check his blood pressure.  Reviewed last cardiology OV - 08/27/2018, referred back to cardiology because of bradycardia with a heart rate of 30 that was noted at a another doctor's office visit.,  Patient has baseline has slow heart rate usually around 58 or so, he is asymptomatic.  Itchy rash is in patches scattered to back.     Patient Active Problem List   Diagnosis Date Noted  . 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 Diabetes mellitus without complication ([place]) 83/41/9622  . Cervicalgia 03/08/2014  . Whiplash injury to neck 03/08/2014  . Aortic heart murmur 10/18/2013  . Acute myocardial infarction, History of:   [name] Benign essential HTN   . Hyperlipidemia LDL goal <70   . Anemia 04/23/2013  . 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 Ischemic cardiomyopathy 11/30/2011  . Actinic keratosis, left scalp 10/04/2011  . Seborrheic keratosis, right lateral leg 10/04/2011     Prior to Admission medications   Medication Sig Start Date End Date Taking? Authorizing Provider  amLODipine-olmesartan (AZOR) 5-20 MG tablet  04/10/17  Yes [provider]  aspirin EC 81 MG tablet Take 81 mg by mouth daily.   Yes [provider]  citalopram (CELEXA) 10 MG tablet TAKE ONE (1) TABLET BY MOUTH EVERY DAY 08/26/18  Yes Susy Frizzle, MD  doxazosin (CARDURA) 4 MG tablet TAKE ONE (1) TABLET BY MOUTH EVERY DAY 11/26/17  Yes Susy Frizzle, MD  JANUVIA 50 MG tablet TAKE 1 TABLET BY MOUTH EVERY DAY 07/30/18  Yes Susy Frizzle, MD  pantoprazole (PROTONIX) 40 MG tablet TAKE ONE TABLET TWICE DAILY 06/09/18  Yes Susy Frizzle, MD  pantoprazole (PROTONIX) 40 MG tablet TAKE ONE TABLET TWICE DAILY 09/10/18  Yes Susy Frizzle, MD  pravastatin (PRAVACHOL) 20 MG tablet TAKE ONE (1) TABLET BY MOUTH EVERY DAY 12/27/17  Yes Susy Frizzle, MD  sildenafil (VIAGRA) 100 MG tablet Take 0.5-1 tablets (50-100 mg total) by mouth daily as needed for erectile dysfunction. 04/29/17  Yes Susy Frizzle, MD  sodium bicarbonate 650 MG tablet Take 650 mg by mouth 3 (three) times daily.   Yes [provider]     No Known Allergies   Family History  Problem Relation Age of Onset  . Cancer Mother        breast  . Cancer Father        bone  . [place] cancer Neg Hx      Social History  Socioeconomic History  . Marital status: Married    Spouse name: Not on file  . Number of children: Not on file  . Years of education: Not on file  . Highest education level: Not on file  Occupational History  . Not on file  Social Needs  . Financial resource strain: Not on file  . Food insecurity:    Worry: Not on file    Inability: Not on file  . Transportation needs:    Medical: Not on file    Non-medical: Not on file  Tobacco Use  . Smoking status: Former Smoker    Types: Cigarettes    Last attempt to quit: 10/01/1986    Years since quitting: 32.0  . Smokeless tobacco: Never Used  . Tobacco comment: quit about 30 yrs ago  Substance and Sexual Activity  . Alcohol use: No  . Drug use: No  . 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 Physical activity:    Days per week: Not on file    Minutes per session: Not on file  . Stress: Not on file  Relationships  . Social connections:    Talks on phone: Not on file    Gets together: Not on file    Attends religious service: Not on file    Active member of club or organization: Not on file    Attends meetings of clubs or organizations: Not on file    Relationship status: Not on file  . Intimate partner violence:    Fear of current or ex partner: Not on file    Emotionally abused: Not on file    Physically abused: Not on file    Forced sexual activity: Not on file  Other Topics Concern  . Not on file  Social History Narrative   Married father of one grandfather 72. Works out at [employer] at [employer] roughly 2-3 days a week. He works on a treadmill. He does note having a hard time getting his heart rate up.   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   He quit smoking in 1988, and does not drink alcohol.     Review of Systems  Constitutional: Negative.   HENT: Negative.   Eyes: Negative.   Respiratory: Negative.   Cardiovascular: Negative.   Gastrointestinal: Negative.   Endocrine: Negative.   Genitourinary: Negative.   Musculoskeletal: Negative.   Skin: Negative.   Allergic/Immunologic: Negative.   Neurological: Negative.   Hematological: Negative.   Psychiatric/Behavioral: Negative.   All other systems reviewed and are negative.      Objective:    Vitals:   10/14/18 0814  BP: 136/72  Pulse: (!) 58  Resp: 15  Temp: 97.6 F (36.4 C)  TempSrc: Oral  SpO2: 98%  Weight: 159 lb 6 oz (72.3 kg)  Height: 5\' 8"  (1.727 m)      Physical Exam Constitutional:      General: He is not in acute distress.    Appearance: Normal appearance. He is well-developed. He is not ill-appearing or toxic-appearing.  HENT:     Head: Normocephalic and atraumatic.     Jaw: No  trismus.     Right Ear: Tympanic membrane, ear canal and external ear normal.     Left Ear: Tympanic membrane, ear canal and external ear normal.     Nose: No mucosal edema or rhinorrhea.  Right Sinus: No maxillary sinus tenderness or frontal sinus tenderness.     Left Sinus: No maxillary sinus tenderness or frontal sinus tenderness.     Mouth/Throat:     Pharynx: Uvula midline. No oropharyngeal exudate, posterior oropharyngeal erythema or uvula swelling.  Eyes:     General: Lids are normal.     Conjunctiva/sclera: Conjunctivae normal.     Pupils: Pupils are equal, round, and reactive to light.  Neck:     Musculoskeletal: Normal range of motion and neck supple.     Trachea: Trachea and phonation normal. No tracheal deviation.  Cardiovascular:     Rate and Rhythm: Regular rhythm.     Pulses: Normal pulses.          Radial pulses are 2+ on the right side and 2+ on the left side.       Posterior tibial pulses are 2+ on the right side and 2+ on the left side.     Heart sounds: Normal heart sounds. No murmur. No friction rub. No gallop.   Pulmonary:     Effort: Pulmonary effort is normal.     Breath sounds: Normal breath sounds. No wheezing, rhonchi or rales.  Abdominal:     General: Bowel sounds are normal. There is no distension.     Palpations: Abdomen is soft.     Tenderness: There is no abdominal tenderness. There is no guarding or rebound.  Musculoskeletal: Normal range of motion.  Skin:    General: Skin is warm and dry.     Capillary Refill: Capillary refill takes less than 2 seconds.     Findings: Rash present.  Neurological:     Mental Status: He is alert and oriented to person, place, and time.     Gait: Gait normal.  Psychiatric:        Speech: Speech normal.        Behavior: Behavior normal.           Assessment & Plan:      ICD-10-CM   1. Essential hypertension K81 COMPLETE METABOLIC PANEL WITH GFR  2. CKD (chronic kidney disease) stage 4, GFR 15-29 ml/min  (HCC) E75.1 COMPLETE METABOLIC PANEL WITH GFR  3. Rash and nonspecific skin eruption R21      Recheck of blood pressure, and past and most recent office visits blood pressures been well controlled today it slightly elevated above where it usually is, but it still near goal 136/72, will check CMP for renal function, electrolytes, he also has CKD stage IV  Applied topical steroid cream twice daily for 1 week and decrease as improving, focus on hydration does appear slightly eczematous in nature  Delsa Grana, PA-C 10/14/18 8:21 AM

## 2018-10-15 ENCOUNTER — Telehealth: Payer: Self-pay | Admitting: *Deleted

## 2018-10-15 DIAGNOSIS — I4729 Other ventricular tachycardia: Secondary | ICD-10-CM

## 2018-10-15 DIAGNOSIS — I255 Ischemic cardiomyopathy: Secondary | ICD-10-CM

## 2018-10-15 DIAGNOSIS — I472 Ventricular tachycardia: Secondary | ICD-10-CM

## 2018-10-15 NOTE — Telephone Encounter (Signed)
-----   Message from Erma Heritage, [place] sent at 10/15/2018 11:37 AM EST ----- Please let the patient know his event monitor showed no significant bradycardia like what was noted during the office visit with his Nephrologist. He has a low-resting HR at baseline but average HR remains in the high- 50's and has been like this for several years by review of prior notes. He does have occasional PVC's which are extra beats and he did have a run of nonsustained ventricular tachycardia which lasted for 9 beats. I reviewed this with Dr. Bronson Ing and he was in agreement we obtain a repeat echocardiogram to make sure pumping function of the heart has overall remained stable and has not further decreased and causing the episodes of NSVT (associations for echo would be for ischemic cardiomyopathy and NSVT). Please forward a copy of results to Susy Frizzle, MD.

## 2018-10-27 ENCOUNTER — Ambulatory Visit (HOSPITAL_COMMUNITY)
Admission: RE | Admit: 2018-10-27 | Discharge: 2018-10-27 | Disposition: A | Discharge status: Home / Self Care | Payer: Medicare Other | Source: Ambulatory Visit | Attending: Student | Admitting: Student

## 2018-10-27 DIAGNOSIS — I255 Ischemic cardiomyopathy: Secondary | ICD-10-CM | POA: Diagnosis not present

## 2018-10-27 DIAGNOSIS — I472 Ventricular tachycardia: Secondary | ICD-10-CM

## 2018-10-27 NOTE — Progress Notes (Signed)
*  PRELIMINARY RESULTS* Echocardiogram 2D Echocardiogram has been performed.  Leavy Cella 10/27/2018, 10:07 AM

## 2018-11-03 ENCOUNTER — Other Ambulatory Visit: Payer: Self-pay | Admitting: Family Medicine

## 2018-11-24 ENCOUNTER — Other Ambulatory Visit: Payer: Self-pay | Admitting: Family Medicine

## 2018-12-23 ENCOUNTER — Other Ambulatory Visit: Payer: Self-pay | Admitting: Family Medicine

## 2018-12-23 DIAGNOSIS — I10 Essential (primary) hypertension: Secondary | ICD-10-CM | POA: Diagnosis not present

## 2018-12-23 DIAGNOSIS — R809 Proteinuria, unspecified: Secondary | ICD-10-CM | POA: Diagnosis not present

## 2018-12-23 DIAGNOSIS — Z79899 Other long term (current) drug therapy: Secondary | ICD-10-CM | POA: Diagnosis not present

## 2018-12-23 DIAGNOSIS — N183 Chronic kidney disease, stage 3 (moderate): Secondary | ICD-10-CM | POA: Diagnosis not present

## 2018-12-23 DIAGNOSIS — E559 Vitamin D deficiency, unspecified: Secondary | ICD-10-CM | POA: Diagnosis not present

## 2018-12-23 DIAGNOSIS — D509 Iron deficiency anemia, unspecified: Secondary | ICD-10-CM | POA: Diagnosis not present

## 2019-01-05 DIAGNOSIS — N183 Chronic kidney disease, stage 3 (moderate): Secondary | ICD-10-CM | POA: Diagnosis not present

## 2019-01-05 DIAGNOSIS — E1129 Type 2 diabetes mellitus with other diabetic kidney complication: Secondary | ICD-10-CM | POA: Diagnosis not present

## 2019-01-05 DIAGNOSIS — E8889 Other specified metabolic disorders: Secondary | ICD-10-CM | POA: Diagnosis not present

## 2019-01-05 DIAGNOSIS — R809 Proteinuria, unspecified: Secondary | ICD-10-CM | POA: Diagnosis not present

## 2019-02-02 ENCOUNTER — Other Ambulatory Visit: Payer: Self-pay | Admitting: Family Medicine

## 2019-05-01 ENCOUNTER — Ambulatory Visit (INDEPENDENT_AMBULATORY_CARE_PROVIDER_SITE_OTHER): Payer: Medicare Other | Admitting: Family Medicine

## 2019-05-01 ENCOUNTER — Encounter: Payer: Self-pay | Admitting: Family Medicine

## 2019-05-01 DIAGNOSIS — Z951 Presence of aortocoronary bypass graft: Secondary | ICD-10-CM | POA: Diagnosis not present

## 2019-05-01 DIAGNOSIS — E785 Hyperlipidemia, unspecified: Secondary | ICD-10-CM

## 2019-05-01 DIAGNOSIS — Z0000 Encounter for general adult medical examination without abnormal findings: Secondary | ICD-10-CM

## 2019-05-01 DIAGNOSIS — Z0001 Encounter for general adult medical examination with abnormal findings: Secondary | ICD-10-CM | POA: Diagnosis not present

## 2019-05-01 DIAGNOSIS — E118 Type 2 diabetes mellitus with unspecified complications: Secondary | ICD-10-CM | POA: Diagnosis not present

## 2019-05-01 DIAGNOSIS — N184 Chronic kidney disease, stage 4 (severe): Secondary | ICD-10-CM | POA: Diagnosis not present

## 2019-05-01 DIAGNOSIS — Z125 Encounter for screening for malignant neoplasm of prostate: Secondary | ICD-10-CM

## 2019-05-01 NOTE — Progress Notes (Signed)
Subjective:    Patient ID: Bradley Black, male    DOB: Nov 18, 1945, 73 y.o.   MRN: [id number]  HPI  Patient is here today for complete physical exam.  He is overdue for a colonoscopy however the patient did perform a Cologuard last year in May 2019.  This was negative and therefore his [place] cancer screening is not due again for 2 more years.  He is due for a PSA to screen for prostate cancer.   Patient is also a diabetic.  Diabetic foot exam was performed today.  The patient has normal sensation to 10 g monofilament in his right foot however he has a very weak dorsalis pedis pulse and posterior tibialis pulse in the right leg.  He denies any claudication.  The toes are warm and well-perfused.  However he does have diminished pulses in the foot.  In the left leg, the patient has strong dorsalis pedis and posterior tibialis pulses.  However he has diminished sensation to 10 g monofilament in toes 2 through 5.  Again he denies any claudication although he does report occasional pins-and-needles pain in the toes of that leg due to nerve damage.  Immunization records are listed below.  These are up-to-date except for a flu shot this fall.  Patient politely declines a tetanus shot.  He denies any issues with falls.  He denies any issues with depression.  He shows no evidence of memory loss.  He denies any chest pain shortness of breath or dyspnea on exertion.  He does have bradycardia.  However he denies any syncope.  He denies any presyncope.  He denies any dyspnea on exertion.  He denies any palpitations.  He does report some mild occasional lightheadedness when he stands resolves quickly and sounds more like orthostatic hypotension rather than symptomatic bradycardia.  Immunization History  Administered Date(s) Administered  . Influenza, High Dose Seasonal PF 08/10/2016, 07/31/2017  . Influenza,inj,Quad PF,6+ Mos 07/12/2015  . Pneumococcal Conjugate-13 04/19/2015  . Pneumococcal Polysaccharide-23  02/12/2013  . Tdap 10/01/2001  . Zoster 03/04/2007     Past Medical History:  Diagnosis Date  . Acute myocardial infarction, History of: 1988   PTCA of OM; PTCA   . Benign essential HTN    right shoulder  . BPH (benign prostatic hyperplasia)   . CAD S/P percutaneous coronary angioplasty    a. PTCA of [place] b. PCI with BMS to LAD in 1997 c. RCA PCI [place] d. s/p CABG in 11/2011 with LIMA-LAD, SVG-PDA, SVG-OM2, and SVG-D1  . Chronic back pain   . CKD (chronic kidney disease), stage III ([place])   . Cyst of bursa    R shoulder  . Diabetic retinopathy ([place])   . DM (diabetes mellitus), type 2 with renal complications ([employer])   . GERD (gastroesophageal reflux disease)   . Hyperlipidemia LDL goal <70   . Ischemic cardiomyopathy 11/2011   Intra-OP TEE: EF 40-45%, no regional WMA; improved Anterior WM post CABG.  . 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 BACK SURGERY  1970  . CARDIAC CATHETERIZATION  2013  . CORONARY ANGIOPLASTY  1994   OM  . CORONARY ANGIOPLASTY  1997   LAD  . CORONARY ANGIOPLASTY WITH STENT PLACEMENT  1999   RCA  . CORONARY ANGIOPLASTY WITH STENT  PLACEMENT  2001   RCA  . CORONARY ARTERY BYPASS GRAFT  12/27/2011   Procedure: CORONARY ARTERY BYPASS GRAFTING (CABG);  Surgeon: Rexene Alberts, MD;  Location: [facility];  Service: Open Heart Surgery;  Laterality: N/A;  Times four. On pump. Using endoscopically harvested right greater saphenous vein and left internal mammary artery.   [name] HERNIA REPAIR    . INCISION AND DRAINAGE OF WOUND  2006   axilla  . INTRAOPERATIVE TRANSESOPHAGEAL ECHOCARDIOGRAM  12/27/2011   Global hypokinesis with EF of 40-45%, improved LAD distribution wall motion.  [name] LEFT HEART CATHETERIZATION WITH CORONARY ANGIOGRAM N/A 12/13/2011   Procedure: LEFT HEART CATHETERIZATION WITH CORONARY ANGIOGRAM;  Surgeon: Leonie Man,  MD;  Location: [facility] CATH LAB;  Service: Cardiovascular;  Laterality: N/A;  . LUMBAR FUSION    . MASS EXCISION  10/24/11   R arm  . TRANSESOPHAGEAL ECHOCARDIOGRAM  2013   Current Outpatient Medications on File Prior to Visit  Medication Sig Dispense Refill  . amLODipine-olmesartan (AZOR) 5-20 MG tablet     . aspirin EC 81 MG tablet Take 81 mg by mouth daily.    . citalopram (CELEXA) 10 MG tablet TAKE ONE (1) TABLET BY MOUTH EVERY DAY 90 tablet 3  . doxazosin (CARDURA) 4 MG tablet TAKE ONE (1) TABLET BY MOUTH EVERY DAY 90 tablet 3  . JANUVIA 50 MG tablet TAKE 1 TABLET BY MOUTH EVERY DAY 90 tablet 0  . pantoprazole (PROTONIX) 40 MG tablet TAKE ONE TABLET TWICE DAILY 180 tablet 3  . pantoprazole (PROTONIX) 40 MG tablet TAKE ONE TABLET TWICE DAILY 180 tablet 3  . pravastatin (PRAVACHOL) 20 MG tablet TAKE ONE (1) TABLET BY MOUTH EVERY DAY 90 tablet 3  . sildenafil (VIAGRA) 100 MG tablet Take 0.5-1 tablets (50-100 mg total) by mouth daily as needed for erectile dysfunction. 60 tablet 5  . sodium bicarbonate 650 MG tablet Take 650 mg by mouth 3 (three) times daily.    [name] triamcinolone ointment (KENALOG) 0.1 % Apply 1 application topically 2 (two) times daily. 30 g 0   No current facility-administered medications on file prior to visit.    No Known Allergies Social History   Socioeconomic History  . Marital status: Married    Spouse name: Not on file  . Number of children: Not on file  . Years of education: Not on file  . Highest education level: Not on file  Occupational History  . Not on file  Social Needs  . Financial resource strain: Not on file  . Food insecurity    Worry: Not on file    Inability: Not on file  . Transportation needs    Medical: Not on file    Non-medical: Not on file  Tobacco Use  . Smoking status: Former Smoker    Types: Cigarettes    Quit date: 10/01/1986    Years since quitting: 32.6  . Smokeless tobacco: Never Used  . Tobacco comment: quit about 30 yrs ago   Substance and Sexual Activity  . Alcohol use: No  . Drug use: No  . 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 Physical activity    Days per week: Not on file    Minutes per session: Not on file  . Stress: Not on file  Relationships  . Social Herbalist on phone: Not on file    Gets together: Not  on file    Attends religious service: Not on file    Active member of club or organization: Not on file    Attends meetings of clubs or organizations: Not on file    Relationship status: Not on file  . Intimate partner violence    Fear of current or ex partner: Not on file    Emotionally abused: Not on file    Physically abused: Not on file    Forced sexual activity: Not on file  Other Topics Concern  . Not on file  Social History Narrative   Married father of one grandfather 52. Works out at [employer] at [employer] roughly 2-3 days a week. He works on a treadmill. He does note having a hard time getting his heart rate up.   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   He quit smoking in 1988, and does not drink alcohol.   Family History  Problem Relation Age of Onset  . Cancer Mother        breast  . Cancer Father        bone  . [place] cancer Neg Hx      Review of Systems  All other systems reviewed and are negative.      Objective:   Physical Exam  Constitutional: He is oriented to person, place, and time. He appears well-developed and well-nourished. No distress.  HENT:  Head: Normocephalic and atraumatic.  Right Ear: External ear normal.  Left Ear: External ear normal.  Nose: Nose normal.  Mouth/Throat: Oropharynx is clear and moist. No oropharyngeal exudate.  Eyes: Pupils are equal, round, and reactive to light. Conjunctivae and EOM are normal. Right eye exhibits no discharge. Left eye exhibits no discharge. No scleral icterus.  Neck: Normal range of motion. Neck supple. No JVD  present. No tracheal deviation present. No thyromegaly present.  Cardiovascular: Normal rate, regular rhythm, normal heart sounds and intact distal pulses. Exam reveals no gallop and no friction rub.  No murmur heard. Pulmonary/Chest: Effort normal and breath sounds normal. No stridor. No respiratory distress. He has no wheezes. He has no rales. He exhibits no tenderness.  Abdominal: Soft. Bowel sounds are normal. He exhibits no distension and no mass. There is no abdominal tenderness. There is no rebound and no guarding.  Musculoskeletal: Normal range of motion.        General: No tenderness or edema.  Lymphadenopathy:    He has no cervical adenopathy.  Neurological: He is alert and oriented to person, place, and time. He has normal reflexes. No cranial nerve deficit. Coordination normal.  Skin: Skin is warm. No rash noted. He is not diaphoretic. No erythema. No pallor.  Psychiatric: He has a normal mood and affect. His behavior is normal. Judgment and thought content normal.  Vitals reviewed.         Assessment & Plan:  Diagnoses of General medical exam, Controlled type 2 diabetes mellitus with complication, without long-term current use of insulin ([place]), Hyperlipidemia LDL goal <70, S/P CABG x 4, CKD (chronic kidney disease) stage 4, GFR 15-29 ml/min (HCC), and Prostate cancer screening were pertinent to this visit. Patient's physical exam today is normal with no evidence of depression, memory loss, or increased fall risk.  Regarding his diabetes, I will check a hemoglobin A1c.  Ideally I like his hemoglobin A1c to be less than 6.5.  Because of his history of coronary artery disease I will check a fasting lipid panel.  Ideally I like  his LDL cholesterol to be less than 70 particular given the evidence of peripheral vascular disease on his exam today.  He is already taking an antiplatelet agent and aspirin.  His immunizations are up-to-date.  His [place] cancer screening is up-to-date.  I will  screen the patient for prostate cancer with a PSA.  Otherwise he is doing well except for some pain and stiffness in the joints of his hand.  He has tried cortisone injections and even surgery with minimal relief.  Voltaren gel provided him no relief.  I recommended trying Aspercreme.

## 2019-05-02 LAB — COMPLETE METABOLIC PANEL WITH GFR
AG Ratio: 1.6 (calc) (ref 1.0–2.5)
ALT: 13 U/L (ref 9–46)
AST: 15 U/L (ref 10–35)
Albumin: 4.4 g/dL (ref 3.6–5.1)
Alkaline phosphatase (APISO): 95 U/L (ref 35–144)
BUN/Creatinine Ratio: 18 (calc) (ref 6–22)
BUN: 51 mg/dL — ABNORMAL HIGH (ref 7–25)
CO2: 26 mmol/L (ref 20–32)
Calcium: 9.1 mg/dL (ref 8.6–10.3)
Chloride: 104 mmol/L (ref 98–110)
Creat: 2.84 mg/dL — ABNORMAL HIGH (ref 0.70–1.18)
GFR, Est African American: 24 mL/min/{1.73_m2} — ABNORMAL LOW (ref >=60)
GFR, Est Non African American: 21 mL/min/{1.73_m2} — ABNORMAL LOW (ref >=60)
Globulin: 2.8 g/dL (calc) (ref 1.9–3.7)
Glucose, Bld: 102 mg/dL — ABNORMAL HIGH (ref 65–99)
Potassium: 5 mmol/L (ref 3.5–5.3)
Sodium: 138 mmol/L (ref 135–146)
Total Bilirubin: 1.4 mg/dL — ABNORMAL HIGH (ref 0.2–1.2)
Total Protein: 7.2 g/dL (ref 6.1–8.1)

## 2019-05-02 LAB — LIPID PANEL
Cholesterol: 126 mg/dL (ref <200)
HDL: 37 mg/dL — ABNORMAL LOW (ref >=40)
LDL Cholesterol (Calc): 73 mg/dL (calc)
Non-HDL Cholesterol (Calc): 89 mg/dL (calc) (ref <130)
Total CHOL/HDL Ratio: 3.4 (calc) (ref <5.0)
Triglycerides: 75 mg/dL (ref <150)

## 2019-05-02 LAB — CBC WITH DIFFERENTIAL/PLATELET
Absolute Monocytes: 504 cells/uL (ref 200–950)
Basophils Absolute: 29 cells/uL (ref 0–200)
Basophils Relative: 0.4 %
Eosinophils Absolute: 270 cells/uL (ref 15–500)
Eosinophils Relative: 3.7 %
HCT: 36 % — ABNORMAL LOW (ref 38.5–50.0)
Hemoglobin: 12 g/dL — ABNORMAL LOW (ref 13.2–17.1)
Lymphs Abs: 1110 cells/uL (ref 850–3900)
MCH: 31.1 pg (ref 27.0–33.0)
MCHC: 33.3 g/dL (ref 32.0–36.0)
MCV: 93.3 fL (ref 80.0–100.0)
MPV: 12.5 fL (ref 7.5–12.5)
Monocytes Relative: 6.9 %
Neutro Abs: 5387 cells/uL (ref 1500–7800)
Neutrophils Relative %: 73.8 %
Platelets: 193 10*3/uL (ref 140–400)
RBC: 3.86 10*6/uL — ABNORMAL LOW (ref 4.20–5.80)
RDW: 13 % (ref 11.0–15.0)
Total Lymphocyte: 15.2 %
WBC: 7.3 10*3/uL (ref 3.8–10.8)

## 2019-05-02 LAB — PSA: PSA: 1.3 ng/mL (ref <=4.0)

## 2019-05-02 LAB — HEMOGLOBIN A1C
Hgb A1c MFr Bld: 6 % of total Hgb — ABNORMAL HIGH (ref <5.7)
Mean Plasma Glucose: 126 (calc)
eAG (mmol/L): 7 (calc)

## 2019-05-02 LAB — MICROALBUMIN, URINE: Microalb, Ur: 104 mg/dL

## 2019-05-05 ENCOUNTER — Other Ambulatory Visit: Payer: Self-pay | Admitting: Family Medicine

## 2019-07-23 DIAGNOSIS — N183 Chronic kidney disease, stage 3 unspecified: Secondary | ICD-10-CM | POA: Diagnosis not present

## 2019-07-23 DIAGNOSIS — R809 Proteinuria, unspecified: Secondary | ICD-10-CM | POA: Diagnosis not present

## 2019-07-23 DIAGNOSIS — I10 Essential (primary) hypertension: Secondary | ICD-10-CM | POA: Diagnosis not present

## 2019-07-23 DIAGNOSIS — D649 Anemia, unspecified: Secondary | ICD-10-CM | POA: Diagnosis not present

## 2019-07-23 DIAGNOSIS — Z79899 Other long term (current) drug therapy: Secondary | ICD-10-CM | POA: Diagnosis not present

## 2019-07-24 DIAGNOSIS — D631 Anemia in chronic kidney disease: Secondary | ICD-10-CM | POA: Insufficient documentation

## 2019-07-24 DIAGNOSIS — N184 Chronic kidney disease, stage 4 (severe): Secondary | ICD-10-CM | POA: Insufficient documentation

## 2019-07-24 DIAGNOSIS — I10 Essential (primary) hypertension: Secondary | ICD-10-CM | POA: Insufficient documentation

## 2019-07-24 DIAGNOSIS — E875 Hyperkalemia: Secondary | ICD-10-CM | POA: Insufficient documentation

## 2019-07-29 DIAGNOSIS — R809 Proteinuria, unspecified: Secondary | ICD-10-CM | POA: Diagnosis not present

## 2019-07-29 DIAGNOSIS — E872 Acidosis: Secondary | ICD-10-CM | POA: Diagnosis not present

## 2019-07-29 DIAGNOSIS — N189 Chronic kidney disease, unspecified: Secondary | ICD-10-CM | POA: Diagnosis not present

## 2019-07-29 DIAGNOSIS — E211 Secondary hyperparathyroidism, not elsewhere classified: Secondary | ICD-10-CM | POA: Diagnosis not present

## 2019-07-29 DIAGNOSIS — D638 Anemia in other chronic diseases classified elsewhere: Secondary | ICD-10-CM | POA: Diagnosis not present

## 2019-08-24 ENCOUNTER — Other Ambulatory Visit: Payer: Self-pay | Admitting: Family Medicine

## 2019-09-07 ENCOUNTER — Other Ambulatory Visit: Payer: Self-pay | Admitting: Family Medicine

## 2019-09-22 ENCOUNTER — Other Ambulatory Visit: Payer: Self-pay | Admitting: *Deleted

## 2019-09-22 NOTE — Patient Outreach (Signed)
[facility]) Care Management  09/22/2019  Bradley Black 1945-12-11 [id number]  Pt. Seen today at [facility] for retinal eye screening. He voices that he has diabetes, high blood pressure, kidney disease stage 2 or 3, heart disease (heart surgery at age 73). Mr. Schmieder shares he monitors blood sugars once a week. States A1c is 7. He also monitors blood pressure. Agreeable to a call from a nurse.  Sharyon Cable. Sarya Linenberger Hydrographic surveyor, [employer], [place] Care Management Armed forces operational [email or web address] [phone] Fax- [phone]

## 2019-10-13 ENCOUNTER — Other Ambulatory Visit: Payer: Self-pay | Admitting: *Deleted

## 2019-10-13 ENCOUNTER — Encounter: Payer: Self-pay | Admitting: *Deleted

## 2019-10-13 NOTE — Patient Outreach (Signed)
Referral received for disease management as result of diabetic eye screening at [facility] with score of 5, pt with diagnoses DM AIC=7, history MI, HTN, HLD, CAD, CKD stage 3-4, outreach call to pt for telephonic assessment, spoke with pt, HIPAA verified, pt reports he lives with his wife and is overall independent, still works part time, tries to exercise (walks), does not check blood sugar and has not done so in a year but is willing to try and check a few times weekly, Pt states he tries to eat correctly but is tired of it after all these years, that is why he stopped checking blood sugar "it gets old" but he is willing to work on it. RN CM reviewed medications with pt. RN CM faxed today's note and barrier letter to primary MD Dr. Dennard Schaumann.  Outpatient Encounter Medications as of 10/13/2019  Medication Sig  . amLODipine-olmesartan (AZOR) 5-20 MG tablet   . aspirin EC 81 MG tablet Take 81 mg by mouth daily.  . cinacalcet (SENSIPAR) 30 MG tablet Take 30 mg by mouth daily with breakfast.   . citalopram (CELEXA) 10 MG tablet TAKE ONE (1) TABLET BY MOUTH EVERY DAY  . doxazosin (CARDURA) 4 MG tablet TAKE ONE (1) TABLET BY MOUTH EVERY DAY  . JANUVIA 50 MG tablet TAKE 1 TABLET BY MOUTH EVERY DAY  . pantoprazole (PROTONIX) 40 MG tablet TAKE ONE TABLET BY MOUTH TWICE A DAY  . pravastatin (PRAVACHOL) 20 MG tablet TAKE ONE (1) TABLET BY MOUTH EVERY DAY  . sildenafil (VIAGRA) 100 MG tablet Take 0.5-1 tablets (50-100 mg total) by mouth daily as needed for erectile dysfunction.  . sodium bicarbonate 650 MG tablet Take 650 mg by mouth 3 (three) times daily.  [name] triamcinolone ointment (KENALOG) 0.1 % Apply 1 application topically 2 (two) times daily.   No facility-administered encounter medications on file as of 10/13/2019.   THN CM Care Plan Problem One     Most Recent Value  Care Plan Problem One  Knowledge deficit related to diabetes  Role Documenting the Problem One  Care Management [place] for  Problem One  Active  THN Long Term Goal   Pt will demonstrate improved self care for diabetes within 60 days  THN Long Term Goal Start Date  10/13/19  Interventions for Problem One Long Term Goal  RN CM established plan of care with pt,  mailed successful outreach letter to pt home including consent form, 24 hour nurse line magnet, pamphlet, [place] calendar, reviewed effects hyperglycemia and HTN has on CKD and importance of keeping BP and CBG within normal limits  THN CM Short Term Goal #1   Pt will check CBG and record 2-3 times weekly within 30 days  THN CM Short Term Goal #1 Start Date  10/13/19  Interventions for Short Term Goal #1  RN CM ask pt to check CBG and record in [facility] calendar at least 2-3 times weekly (pt does not feel he will check daily)  THN CM Short Term Goal #2   Pt will adhere to plate method at least 50% of the time within 30 days  THN CM Short Term Goal #2 Start Date  10/13/19  Interventions for Short Term Goal #2  RNCM reviewed plate method and nutritious food choices, gave examples.      PLAN Outreach pt next month for telephone assessment Ask if pt checking CBG Reinforce diet  Jacqlyn Larsen [facility], [place] Coordinator [phone]

## 2019-10-21 DIAGNOSIS — E211 Secondary hyperparathyroidism, not elsewhere classified: Secondary | ICD-10-CM | POA: Diagnosis not present

## 2019-10-21 DIAGNOSIS — E1129 Type 2 diabetes mellitus with other diabetic kidney complication: Secondary | ICD-10-CM | POA: Diagnosis not present

## 2019-10-21 DIAGNOSIS — R809 Proteinuria, unspecified: Secondary | ICD-10-CM | POA: Diagnosis not present

## 2019-10-21 DIAGNOSIS — N189 Chronic kidney disease, unspecified: Secondary | ICD-10-CM | POA: Diagnosis not present

## 2019-10-21 DIAGNOSIS — E1122 Type 2 diabetes mellitus with diabetic chronic kidney disease: Secondary | ICD-10-CM | POA: Diagnosis not present

## 2019-10-29 DIAGNOSIS — E872 Acidosis: Secondary | ICD-10-CM | POA: Diagnosis not present

## 2019-10-29 DIAGNOSIS — E211 Secondary hyperparathyroidism, not elsewhere classified: Secondary | ICD-10-CM | POA: Diagnosis not present

## 2019-10-29 DIAGNOSIS — N189 Chronic kidney disease, unspecified: Secondary | ICD-10-CM | POA: Diagnosis not present

## 2019-10-29 DIAGNOSIS — R809 Proteinuria, unspecified: Secondary | ICD-10-CM | POA: Diagnosis not present

## 2019-10-29 DIAGNOSIS — D638 Anemia in other chronic diseases classified elsewhere: Secondary | ICD-10-CM | POA: Diagnosis not present

## 2019-11-10 ENCOUNTER — Other Ambulatory Visit: Payer: Self-pay | Admitting: *Deleted

## 2019-11-10 NOTE — Patient Outreach (Signed)
Outreach call to pt for telephone assessment, patient's wife answered the phone and states " he's outside working, you need to call his cell phone"  RN CM called patient's cell phone with no answer and left voicemail requesting return phone call.  RN CM mailed unsuccessful outreach letter to pt home.  RN CM transferred care of pt to another RN health coach due to change in assignment.  Order placed for reassignment.  PLAN Outreach pt in 3-4 business days  Jacqlyn Larsen [facility], [place] [phone]

## 2019-11-30 ENCOUNTER — Other Ambulatory Visit: Payer: Self-pay | Admitting: Family Medicine

## 2019-12-01 ENCOUNTER — Telehealth: Payer: Self-pay

## 2019-12-01 NOTE — Telephone Encounter (Signed)
Virtual Visit Pre-Appointment Phone Call  "(Name), I am calling you today to discuss your upcoming appointment. We are currently trying to limit exposure to the virus that causes COVID-19 by seeing patients at home rather than in the office."  1. "What is the BEST phone number to call the day of the visit?" - include this in appointment notes  2. "Do you have or have access to (through a family member/friend) a smartphone with video capability that we can use for your visit?" a. If yes - list this number in appt notes as "cell" (if different from BEST phone #) and list the appointment type as a VIDEO visit in appointment notes b. If no - list the appointment type as a PHONE visit in appointment notes  3. Confirm consent - "In the setting of the current Covid19 crisis, you are scheduled for a (phone or video) visit with your provider on (date) at (time).  Just as we do with many in-office visits, in order for you to participate in this visit, we must obtain consent.  If you'd like, I can send this to your mychart (if signed up) or email for you to review.  Otherwise, I can obtain your verbal consent now.  All virtual visits are billed to your insurance company just like a normal visit would be.  By agreeing to a virtual visit, we'd like you to understand that the technology does not allow for your provider to perform an examination, and thus may limit your provider's ability to fully assess your condition. If your provider identifies any concerns that need to be evaluated in person, we will make arrangements to do so.  Finally, though the technology is pretty good, we cannot assure that it will always work on either your or our end, and in the setting of a video visit, we may have to convert it to a phone-only visit.  In either situation, we cannot ensure that we have a secure connection.  Are you willing to proceed?" STAFF: Did the patient verbally acknowledge consent to telehealth visit? Document  YES/NO here:   4. Advise patient to be prepared - "Two hours prior to your appointment, go ahead and check your blood pressure, pulse, oxygen saturation, and your weight (if you have the equipment to check those) and write them all down. When your visit starts, your provider will ask you for this information. If you have an Apple Watch or Kardia device, please plan to have heart rate information ready on the day of your appointment. Please have a pen and paper handy nearby the day of the visit as well."  5. Give patient instructions for MyChart download to smartphone OR Doximity/Doxy.me as below if video visit (depending on what platform provider is using)  6. Inform patient they will receive a phone call 15 minutes prior to their appointment time (may be from unknown caller ID) so they should be prepared to answer    Bradley Black has been deemed a candidate for a follow-up tele-health visit to limit community exposure during the Covid-19 pandemic. I spoke with the patient via phone to ensure availability of phone/video source, confirm preferred email & phone number, and discuss instructions and expectations.  I reminded Bradley Black to be prepared with any vital sign and/or heart rhythm information that could potentially be obtained via home monitoring, at the time of his visit. I reminded Bradley Black to expect a phone call prior to  his visit.  Dorothey Baseman 12/01/2019 9:37 AM   INSTRUCTIONS FOR DOWNLOADING THE MYCHART APP TO SMARTPHONE  - The patient must first make sure to have activated MyChart and know their login information - If Apple, go to App Store and type in MyChart in the search bar and download the app. If Android, ask patient to go to [employer] and type in [place] in the search bar and download the app. The app is free but as with any other app downloads, their phone may require them to verify saved payment information or Apple/Android password.   - The patient will need to then log into the app with their MyChart username and password, and select [facility] as their healthcare provider to link the account. When it is time for your visit, go to the MyChart app, find appointments, and click Begin Video Visit. Be sure to Select Allow for your device to access the Microphone and Camera for your visit. You will then be connected, and your provider will be with you shortly.  **If they have any issues connecting, or need assistance please contact MyChart service desk [phone])**  **If using a computer, in order to ensure the best quality for their visit they will need to use either of the following Internet Browsers: [employer], or Google Chrome**  IF USING DOXIMITY or DOXY.ME - The patient will receive a link just prior to their visit by text.     FULL LENGTH CONSENT FOR TELE-HEALTH VISIT   I hereby voluntarily request, consent and authorize [facility] and its employed or contracted physicians, physician assistants, nurse practitioners or other licensed health care professionals (the Practitioner), to provide me with telemedicine health care services (the "Services") as deemed necessary by the treating Practitioner. I acknowledge and consent to receive the Services by the Practitioner via telemedicine. I understand that the telemedicine visit will involve communicating with the Practitioner through live audiovisual communication technology and the disclosure of certain medical information by electronic transmission. I acknowledge that I have been given the opportunity to request an in-person assessment or other available alternative prior to the telemedicine visit and am voluntarily participating in the telemedicine visit.  I understand that I have the right to withhold or withdraw my consent to the use of telemedicine in the course of my care at any time, without affecting my right to future care or treatment, and that  the Practitioner or I may terminate the telemedicine visit at any time. I understand that I have the right to inspect all information obtained and/or recorded in the course of the telemedicine visit and may receive copies of available information for a reasonable fee.  I understand that some of the potential risks of receiving the Services via telemedicine include:  [name] Delay or interruption in medical evaluation due to technological equipment failure or disruption; . Information transmitted may not be sufficient (e.g. poor resolution of images) to allow for appropriate medical decision making by the Practitioner; and/or  . In rare instances, security protocols could fail, causing a breach of personal health information.  Furthermore, I acknowledge that it is my responsibility to provide information about my medical history, conditions and care that is complete and accurate to the best of my ability. I acknowledge that Practitioner's advice, recommendations, and/or decision may be based on factors not within their control, such as incomplete or inaccurate data provided by me or distortions of diagnostic images or specimens that may result from electronic transmissions. I understand  that the practice of medicine is not an exact science and that Practitioner makes no warranties or guarantees regarding treatment outcomes. I acknowledge that I will receive a copy of this consent concurrently upon execution via email to the email address I last provided but may also request a printed copy by calling the office of [facility].    I understand that my insurance will be billed for this visit.   I have read or had this consent read to me. . I understand the contents of this consent, which adequately explains the benefits and risks of the Services being provided via telemedicine.  . I have been provided ample opportunity to ask questions regarding this consent and the Services and have had my questions answered to  my satisfaction. . I give my informed consent for the services to be provided through the use of telemedicine in my medical care  By participating in this telemedicine visit I agree to the above.

## 2019-12-07 ENCOUNTER — Telehealth (INDEPENDENT_AMBULATORY_CARE_PROVIDER_SITE_OTHER): Payer: Medicare Other | Admitting: Cardiovascular Disease

## 2019-12-07 ENCOUNTER — Encounter: Payer: Self-pay | Admitting: Cardiovascular Disease

## 2019-12-07 VITALS — BP 168/86 | HR 54 | Ht 68.0 in | Wt 160.0 lb

## 2019-12-07 DIAGNOSIS — I129 Hypertensive chronic kidney disease with stage 1 through stage 4 chronic kidney disease, or unspecified chronic kidney disease: Secondary | ICD-10-CM | POA: Diagnosis not present

## 2019-12-07 DIAGNOSIS — R001 Bradycardia, unspecified: Secondary | ICD-10-CM | POA: Diagnosis not present

## 2019-12-07 DIAGNOSIS — I255 Ischemic cardiomyopathy: Secondary | ICD-10-CM

## 2019-12-07 DIAGNOSIS — N184 Chronic kidney disease, stage 4 (severe): Secondary | ICD-10-CM | POA: Diagnosis not present

## 2019-12-07 DIAGNOSIS — E785 Hyperlipidemia, unspecified: Secondary | ICD-10-CM

## 2019-12-07 DIAGNOSIS — M7989 Other specified soft tissue disorders: Secondary | ICD-10-CM

## 2019-12-07 DIAGNOSIS — I25708 Atherosclerosis of coronary artery bypass graft(s), unspecified, with other forms of angina pectoris: Secondary | ICD-10-CM

## 2019-12-07 DIAGNOSIS — I10 Essential (primary) hypertension: Secondary | ICD-10-CM

## 2019-12-07 MED ORDER — AMLODIPINE BESYLATE 10 MG PO TABS: 10.0000 mg | ORAL_TABLET | Freq: Every day | ORAL | 3 refills | Status: DC

## 2019-12-07 NOTE — Addendum Note (Signed)
Addended by: Debbora Lacrosse R on: 12/07/2019 10:43 AM   Modules accepted: Orders

## 2019-12-07 NOTE — Progress Notes (Signed)
Virtual Visit via Telephone Note   This visit type was conducted due to national recommendations for restrictions regarding the COVID-19 Pandemic (e.g. social distancing) in an effort to limit this patient's exposure and mitigate transmission in our community.  Due to his co-morbid illnesses, this patient is at least at moderate risk for complications without adequate follow up.  This format is felt to be most appropriate for this patient at this time.  The patient did not have access to video technology/had technical difficulties with video requiring transitioning to audio format only (telephone).  All issues noted in this document were discussed and addressed.  No physical exam could be performed with this format.  Please refer to the patient's chart for his  consent to telehealth for [facility].   The patient was identified using 2 identifiers.  Date:  12/07/2019   ID:  Bradley Black, Bradley Black 07/14/1946, MRN [id number]  Patient Location: Home Provider Location: Home  PCP:  Susy Frizzle, MD  Cardiologist:  Kate Sable, MD  Electrophysiologist:  None   Evaluation Performed:  Follow-Up Visit  Chief Complaint:  CAD  History of Present Illness:    Bradley Black is a 74 y.o. male with past medical history of CAD(multiple prior interventions with subsequent CABG in 11/2011 with LIMA-LAD, SVG-PDA, SVG-OM2, and SVG-D1), chronic combined systolic and diastolic CHF (EF 17% by echocardiogram in 11/2015), baseline bradycardia, HTN, HLD, Type 2 DM, and Stage 4 CKD.  I last evaluated him in February 2017.  He was last seen in our office in November 2019.  He's had more feet swelling lately. He tries to avoid salt.  He denies chest pain, shortness of breath, dizziness, and palpitations.   Past Medical History:  Diagnosis Date  . Acute myocardial infarction, History of: 1988   PTCA of OM; PTCA   . Benign essential HTN    right shoulder  . BPH (benign prostatic hyperplasia)   .  CAD S/P percutaneous coronary angioplasty    a. PTCA of [place] b. PCI with BMS to LAD in 1997 c. RCA PCI [place] d. s/p CABG in 11/2011 with LIMA-LAD, SVG-PDA, SVG-OM2, and SVG-D1  . Chronic back pain   . CKD (chronic kidney disease), stage III   . Cyst of bursa    R shoulder  . Diabetic retinopathy ([place])   . DM (diabetes mellitus), type 2 with renal complications ([place])   . GERD (gastroesophageal reflux disease)   . Hyperlipidemia LDL goal <70   . Ischemic cardiomyopathy 11/2011   Intra-OP TEE: EF 40-45%, no regional WMA; improved Anterior WM post CABG.  . 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 BACK SURGERY  1970  . CARDIAC CATHETERIZATION  2013  . CORONARY ANGIOPLASTY  1994   OM  . CORONARY ANGIOPLASTY  1997   LAD  . CORONARY ANGIOPLASTY WITH STENT PLACEMENT  1999   RCA  . CORONARY ANGIOPLASTY WITH STENT PLACEMENT  2001   RCA  . CORONARY ARTERY BYPASS GRAFT  12/27/2011   Procedure: CORONARY ARTERY BYPASS GRAFTING (CABG);  Surgeon: Rexene Alberts, MD;  Location: [facility];  Service: Open Heart Surgery;  Laterality: N/A;  Times four. On pump. Using endoscopically harvested right greater saphenous vein and left internal mammary artery.   [name] HERNIA REPAIR    .  INCISION AND DRAINAGE OF WOUND  2006   axilla  . INTRAOPERATIVE TRANSESOPHAGEAL ECHOCARDIOGRAM  12/27/2011   Global hypokinesis with EF of 40-45%, improved LAD distribution wall motion.  [name] LEFT HEART CATHETERIZATION WITH CORONARY ANGIOGRAM N/A 12/13/2011   Procedure: LEFT HEART CATHETERIZATION WITH CORONARY ANGIOGRAM;  Surgeon: Leonie Man, MD;  Location: [facility] CATH LAB;  Service: Cardiovascular;  Laterality: N/A;  . LUMBAR FUSION    . MASS EXCISION  10/24/11   R arm  . TRANSESOPHAGEAL ECHOCARDIOGRAM  2013     Current Meds  Medication Sig  . amLODipine (NORVASC) 2.5 MG tablet Take by mouth.  [name]  amLODipine-olmesartan (AZOR) 5-20 MG tablet   . aspirin EC 81 MG tablet Take 81 mg by mouth daily.  . cinacalcet (SENSIPAR) 30 MG tablet Take 30 mg by mouth daily with breakfast.   . citalopram (CELEXA) 10 MG tablet TAKE ONE (1) TABLET BY MOUTH EVERY DAY  . doxazosin (CARDURA) 4 MG tablet TAKE ONE (1) TABLET BY MOUTH EVERY DAY  . JANUVIA 50 MG tablet TAKE 1 TABLET BY MOUTH EVERY DAY  . pantoprazole (PROTONIX) 40 MG tablet TAKE ONE TABLET BY MOUTH TWICE A DAY  . pravastatin (PRAVACHOL) 20 MG tablet TAKE ONE (1) TABLET BY MOUTH EVERY DAY  . sildenafil (VIAGRA) 100 MG tablet Take 0.5-1 tablets (50-100 mg total) by mouth daily as needed for erectile dysfunction.  . sodium bicarbonate 650 MG tablet Take 650 mg by mouth 3 (three) times daily.  [name] triamcinolone ointment (KENALOG) 0.1 % Apply 1 application topically 2 (two) times daily.     Allergies:   Patient has no known allergies.   Social History   Tobacco Use  . Smoking status: Former Smoker    Types: Cigarettes    Quit date: 10/01/1986    Years since quitting: 33.2  . Smokeless tobacco: Never Used  . Tobacco comment: quit about 30 yrs ago  Substance Use Topics  . Alcohol use: No  . Drug use: No     Family Hx: The patient's family history includes Cancer in his father and mother. There is no history of [place] cancer.  ROS:   Please see the history of present illness.     All other systems reviewed and are negative.   Prior CV studies:   The following studies were reviewed today:  Cardiac Event Monitor: 10/2015  Predominantly sinus rhythm with PVC, asymptomatic.  Isolated episode of sinus tachycardia, HR 120 bpm.  Echocardiogram: 11/2015 Study Conclusions  - Left ventricle: The cavity size was normal. Wall thickness was normal. The estimated ejection fraction was 45%. There is hypokinesis and scarring of the basalinferior myocardium. Features are consistent with a pseudonormal left ventricular filling pattern,  with concomitant abnormal relaxation and increased filling pressure (grade 2 diastolic dysfunction). - Aortic valve: Trileaflet; mildly calcified leaflets. - Mitral valve: Calcified annulus. There was mild regurgitation. - Left atrium: The atrium was mildly dilated. - Right ventricle: Systolic function was mildly reduced. - Right atrium: Central venous pressure (est): 3 mm Hg. - Atrial septum: No defect or patent foramen ovale was identified. - Tricuspid valve: There was trivial regurgitation. - Pulmonary arteries: PA peak pressure: 30 mm Hg (S). - Pericardium, extracardiac: There was no pericardial effusion.  Impressions:  - Normal LV wall thickness with LVEF approximately 45%. There is hypokinesis and scarring of the basal inferior wall. Grade 2 diastolic dysfunction with increased filling pressure. Mild left atrial enlargement. MAC with mild mitral regurgitation. Mildly sclerotic aortic valve.  Mildly reduced RV contraction. Trivial tricuspid regurgitation with PASP 30 mmHg.  Labs/Other Tests and Data Reviewed:    EKG:  No ECG reviewed.  Recent Labs: 05/01/2019: ALT 13; BUN 51; Creat 2.84; Hemoglobin 12.0; Platelets 193; Potassium 5.0; Sodium 138   Recent Lipid Panel Lab Results  Component Value Date/Time   CHOL 126 05/01/2019 09:45 AM   TRIG 75 05/01/2019 09:45 AM   HDL 37 (L) 05/01/2019 09:45 AM   CHOLHDL 3.4 05/01/2019 09:45 AM   LDLCALC 73 05/01/2019 09:45 AM    Wt Readings from Last 3 Encounters:  12/07/19 160 lb (72.6 kg)  05/01/19 152 lb (68.9 kg)  10/14/18 159 lb 6 oz (72.3 kg)     Objective:    Vital Signs:  BP (!) 168/86   Pulse (!) 54   Ht _0  (1.727 m)   Wt 160 lb (72.6 kg)   BMI 24.33 kg/m    VITAL SIGNS:  reviewed  ASSESSMENT & PLAN:    1.  Coronary artery disease: History of four-vessel CABG in March 2013.  Symptomatically stable.  Continue aspirin and statin.  No beta-blocker due to history of bradycardia.  2.  Bradycardia:  Heart rate currently in 50 bpm range.  Asymptomatic.  Continue to avoid AV nodal blocking agents.  3.  Hypertension: Blood pressure is elevated.  I will increase amlodipine to 10 mg daily.  4.  Hyperlipidemia: Lipids reviewed above.  Continue statin therapy.  5.  Chronic kidney disease stage IV: Followed by nephrology.  Creatinine 2.84 on 05/01/2019.  6.  Feet swelling: He used to be on Lasix.  I asked him to speak with his nephrologist.  I think it might help him to take at least 2 days of Lasix.    COVID-19 Education: The signs and symptoms of COVID-19 were discussed with the patient and how to seek care for testing (follow up with PCP or arrange E-visit).  The importance of social distancing was discussed today.  Time:   Today, I have spent 5 minutes with the patient with telehealth technology discussing the above problems.     Medication Adjustments/Labs and Tests Ordered: Current medicines are reviewed at length with the patient today.  Concerns regarding medicines are outlined above.   Tests Ordered: No orders of the defined types were placed in this encounter.   Medication Changes: No orders of the defined types were placed in this encounter.   Follow Up:  In Person in 1 year(s)  Signed, Kate Sable, MD  12/07/2019 9:58 AM    [place]

## 2019-12-07 NOTE — Patient Instructions (Signed)
Medication Instructions:  INCREASE AMLODIPINE TO 10 MG DAILY   Labwork: NONE  Testing/Procedures: NONE  Follow-Up: Your physician wants you to follow-up in: YEAR.  You will receive a reminder letter in the mail two months in advance. If you don't receive a letter, please call our office to schedule the follow-up appointment.   Any Other Special Instructions Will Be Listed Below (If Applicable).     If you need a refill on your cardiac medications before your next appointment, please call your pharmacy.

## 2019-12-15 ENCOUNTER — Encounter: Payer: Self-pay | Admitting: *Deleted

## 2019-12-15 ENCOUNTER — Other Ambulatory Visit: Payer: Self-pay | Admitting: *Deleted

## 2019-12-15 NOTE — Patient Outreach (Signed)
[facility]) Care Management  [facility] Care Manager  12/15/2019   Elden L Latona 1946/06/07 [id number]   [place] Outreach  Referral Date:  11/10/2019 Referral Source:  Transfer from [place] Reason for Referral:  Continued Disease Management Education Insurance:  [employer]   Outreach Attempt:  Successful telephone outreach to patient for follow up.  HIPAA verified with patient.  Patient reporting that his blood pressures have been elevated.  Monitors blood pressures daily and states they have been ranging 150/70's; which is down from 160/70's after Cardiologist increased Amlodipine to 10 mg daily 2 weeks ago.  Reviewed medications with patient and patient reporting he is taking the Amlodipine 10 mg daily and Azor (Amlodipine-Olmesartan 5/20 mg) daily.  Encouraged patient to discuss medications and continued increase in blood pressures with Nephrologist.  Denies any chest pain, shortness of breath, headache or light headiness.  Does report some bilateral feet swelling when going to bed that resolves by morning time.  Patient reporting he does not frequently check his blood sugars.  Last Hgb A1C was 06 April 2019.  Encounter Medications:  Outpatient Encounter Medications as of 12/15/2019  Medication Sig Note  . amLODipine (NORVASC) 10 MG tablet Take 1 tablet (10 mg total) by mouth daily.   [name] amLODipine-olmesartan (AZOR) 5-20 MG tablet  12/15/2019: Reports taking once a day  . aspirin EC 81 MG tablet Take 81 mg by mouth daily.   . cinacalcet (SENSIPAR) 30 MG tablet Take 30 mg by mouth daily with breakfast.    . citalopram (CELEXA) 10 MG tablet TAKE ONE (1) TABLET BY MOUTH EVERY DAY   . doxazosin (CARDURA) 4 MG tablet TAKE ONE (1) TABLET BY MOUTH EVERY DAY   . JANUVIA 50 MG tablet TAKE 1 TABLET BY MOUTH EVERY DAY   . pantoprazole (PROTONIX) 40 MG tablet TAKE ONE TABLET BY MOUTH TWICE A DAY   . pravastatin (PRAVACHOL) 20 MG tablet TAKE  ONE (1) TABLET BY MOUTH EVERY DAY   . sildenafil (VIAGRA) 100 MG tablet Take 0.5-1 tablets (50-100 mg total) by mouth daily as needed for erectile dysfunction.   . sodium bicarbonate 650 MG tablet Take 650 mg by mouth 3 (three) times daily.   [name] triamcinolone ointment (KENALOG) 0.1 % Apply 1 application topically 2 (two) times daily.    No facility-administered encounter medications on file as of 12/15/2019.    Functional Status:  In your present state of health, do you have any difficulty performing the following activities: 12/15/2019  Hearing? N  Vision? N  Difficulty concentrating or making decisions? N  Walking or climbing stairs? N  Dressing or bathing? N  Doing errands, shopping? N  Preparing Food and eating ? N  Using the Toilet? N  In the past six months, have you accidently leaked urine? N  Do you have problems with loss of bowel control? N  Managing your Medications? N  Managing your Finances? N  Housekeeping or managing your Housekeeping? N  Some recent data might be hidden    Fall/Depression Screening: Fall Risk  12/15/2019 05/01/2019 01/27/2018  Falls in the past year? 1 0 No  Number falls in past yr: 0 - -  Comment fell on ice - -  Injury with Fall? 0 - -  Risk for fall due to : Medication side effect - -  Follow up Falls prevention discussed;Education provided;Falls evaluation completed Falls evaluation completed -   [facility] 2/9 Scores 05/01/2019 01/27/2018 01/27/2018 01/22/2017  03/30/2014 02/11/2014  PHQ - 2 Score 0 0 0 0 0 0  PHQ- 9 Score - - - 0 - -   THN CM Care Plan Problem One     Most Recent Value  Care Plan Problem One  Knowledge deficiet related to self care management of diabetes and hypertension.  Role Documenting the Problem One  [place] for Problem One  Active  THN Long Term Goal   Patient will maintain Hgb A1C of below 6.5 within the next 90 days.  THN Long Term Goal Start Date  12/15/19  Interventions for Problem One Long Term Goal  Care  plan and goals reviewed and discussed with patient, encouraged medication compliance, encouraged patient to schedule yearly appointment with primary care provider, encouraged patient to follow up with nephrologist, reviewed current Hgb A1C and congratulated patient on maintaining below goal, encouraged low salt diabetic diet  THN CM Short Term Goal #1   Patient will report speaking to nephrologist about blood pressure and medications within the next 60 days.  THN CM Short Term Goal #1 Start Date  12/15/19  Interventions for Short Term Goal #1  Reviewed current blood pressure readings with paient, encouraged patient to continue to monitor blood pressures daily and to review with providers, reviewed medications and indications and encouraged medication compliance, encouraged patient to discuss medications with nephrologist (not on Lasix, taking Norvasc as well as Azor),     Appointments:  Last attended appointment with primary care provider, Dr. Dennard Schaumann on 05/01/2019 and needs to schedule yearly follow up, encouraged to do so.  Patient last seen Nephrologist on 10/29/19 and has scheduled follow up on 01/20/2020.  Last seen Cardiologist on 12/07/2019.  Plan: RN Health Coach will send primary care and Nephrologist quarterly update. RN Health Coach will make next telephone outreach to patient within the month of June and patient agrees to future outreach.  [place] [phone] [email or web address]

## 2019-12-21 ENCOUNTER — Other Ambulatory Visit: Payer: Self-pay | Admitting: Family Medicine

## 2019-12-21 ENCOUNTER — Ambulatory Visit (INDEPENDENT_AMBULATORY_CARE_PROVIDER_SITE_OTHER): Payer: Medicare Other | Admitting: Nurse Practitioner

## 2019-12-21 ENCOUNTER — Other Ambulatory Visit: Payer: Self-pay

## 2019-12-21 VITALS — BP 130/62 | HR 58 | Temp 97.9°F | Resp 18 | Ht 68.0 in | Wt 165.2 lb

## 2019-12-21 DIAGNOSIS — R05 Cough: Secondary | ICD-10-CM

## 2019-12-21 DIAGNOSIS — J069 Acute upper respiratory infection, unspecified: Secondary | ICD-10-CM

## 2019-12-21 DIAGNOSIS — R059 Cough, unspecified: Secondary | ICD-10-CM

## 2019-12-21 MED ORDER — HYDROCODONE-HOMATROPINE 5-1.5 MG/5ML PO SYRP: 5.0000 mL | ORAL_SOLUTION | Freq: Three times a day (TID) | ORAL | 0 refills | Status: DC | PRN

## 2019-12-21 MED ORDER — AMOXICILLIN 500 MG PO CAPS: 500.0000 mg | ORAL_CAPSULE | Freq: Three times a day (TID) | ORAL | 0 refills | Status: DC

## 2019-12-21 NOTE — Progress Notes (Signed)
Established Patient Office Visit  Subjective:  Patient ID: Bradley Black, male    DOB: 05/24/1946  Age: 74 y.o. MRN: [id number]  CC:  Chief Complaint  Patient presents with  . Hypertension    x3 weeks, even on bp meds its not coming down  . Edema    ankle and feet, x1 week, no meds    HPI [name] is a 74 year old caucasian male presenting for sxs that started one week ago of head cold, post nasal drip, nasal congestion and clear drainage, non productive cough. No fever/chills. He has tried mucinex and tylenol with minimal relief. He had expired hycodan that helps cough however ran out and feels was old. He denied sick contacts, covid contacts, does grocery store shop. He reports that he should not have COVID r/t 2 weeks ago finished second covid vaccine.   Wants refill on Lasix however pt should repeat labs with follow up. He also was seen by cardiology in past 2 weeks virtually for CHF, HTN management.   No cp/ct, gu/gi sxs, pain, sob, or recent falls.   Past Medical History:  Diagnosis Date  . Acute myocardial infarction, History of: 1988   PTCA of OM; PTCA   . Benign essential HTN    right shoulder  . BPH (benign prostatic hyperplasia)   . CAD S/P percutaneous coronary angioplasty    a. PTCA of [place] b. PCI with BMS to LAD in 1997 c. RCA PCI [place] d. s/p CABG in 11/2011 with LIMA-LAD, SVG-PDA, SVG-OM2, and SVG-D1  . Chronic back pain   . CKD (chronic kidney disease), stage III   . Cyst of bursa    R shoulder  . Diabetic retinopathy ([place])   . DM (diabetes mellitus), type 2 with renal complications ([place])   . GERD (gastroesophageal reflux disease)   . Hyperlipidemia LDL goal <70   . Ischemic cardiomyopathy 11/2011   Intra-OP TEE: EF 40-45%, no regional WMA; improved Anterior WM post CABG.  . 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  BACK SURGERY  1970  . CARDIAC CATHETERIZATION  2013  . CORONARY ANGIOPLASTY  1994   OM  . CORONARY ANGIOPLASTY  1997   LAD  . CORONARY ANGIOPLASTY WITH STENT PLACEMENT  1999   RCA  . CORONARY ANGIOPLASTY WITH STENT PLACEMENT  2001   RCA  . CORONARY ARTERY BYPASS GRAFT  12/27/2011   Procedure: CORONARY ARTERY BYPASS GRAFTING (CABG);  Surgeon: Rexene Alberts, MD;  Location: [facility];  Service: Open Heart Surgery;  Laterality: N/A;  Times four. On pump. Using endoscopically harvested right greater saphenous vein and left internal mammary artery.   [name] HERNIA REPAIR    . INCISION AND DRAINAGE OF WOUND  2006   axilla  . INTRAOPERATIVE TRANSESOPHAGEAL ECHOCARDIOGRAM  12/27/2011   Global hypokinesis with EF of 40-45%, improved LAD distribution wall motion.  [name] LEFT HEART CATHETERIZATION WITH CORONARY ANGIOGRAM N/A 12/13/2011   Procedure: LEFT HEART CATHETERIZATION WITH CORONARY ANGIOGRAM;  Surgeon: Leonie Man, MD;  Location: [facility] C[facility] LAB;  Service: Cardiovascular;  Laterality: N/A;  . LUMBAR FUSION    . MASS EXCISION  10/24/11   R arm  . TRANSESOPHAGEAL ECHOCARDIOGRAM  2013    Family History  Problem Relation Age of Onset  .  Cancer Mother        breast  . Cancer Father        bone  . [place] cancer Neg Hx     Social History   Socioeconomic History  . Marital status: Married    Spouse name: Not on file  . Number of children: Not on file  . Years of education: Not on file  . Highest education level: Not on file  Occupational History  . Not on file  Tobacco Use  . Smoking status: Former Smoker    Types: Cigarettes    Quit date: 10/01/1986    Years since quitting: 33.2  . Smokeless tobacco: Never Used  . Tobacco comment: quit about 30 yrs ago  Substance and Sexual Activity  . Alcohol use: No  . Drug use: No  . 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 Not on file  Social History Narrative     Married father of one grandfather 65. Works out at [employer] at [employer] roughly 2-3 days a week. He works on a treadmill. He does note having a hard time getting his heart rate up.   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   He quit smoking in 1988, and does not drink alcohol.   Social Determinants of Health   Financial Resource Strain:   . Difficulty of Paying Living Expenses:   Food Insecurity:   . Worried About Charity fundraiser in the Last Year:   . Arboriculturist in the Last Year:   Transportation Needs:   . Film/video editor (Medical):   [name] Lack of Transportation (Non-Medical):   Physical Activity:   . Days of Exercise per Week:   . Minutes of Exercise per Session:   Stress:   . Feeling of Stress :   Social Connections:   . Frequency of Communication with Friends and Family:   . Frequency of Social Gatherings with Friends and Family:   . Attends Religious Services:   . Active Member of Clubs or Organizations:   . Attends Archivist Meetings:   [name] Marital Status:   Intimate Partner Violence:   . Fear of Current or Ex-Partner:   . Emotionally Abused:   [name] Physically Abused:   . Sexually Abused:     Outpatient Medications Prior to Visit  Medication Sig Dispense Refill  . amLODipine (NORVASC) 10 MG tablet Take 1 tablet (10 mg total) by mouth daily. 90 tablet 3  . aspirin EC 81 MG tablet Take 81 mg by mouth daily.    . cinacalcet (SENSIPAR) 30 MG tablet Take 30 mg by mouth daily with breakfast.     . citalopram (CELEXA) 10 MG tablet TAKE ONE (1) TABLET BY MOUTH EVERY DAY 90 tablet 3  . doxazosin (CARDURA) 4 MG tablet TAKE ONE (1) TABLET BY MOUTH EVERY DAY 90 tablet 3  . JANUVIA 50 MG tablet TAKE 1 TABLET BY MOUTH EVERY DAY 90 tablet 2  . pantoprazole (PROTONIX) 40 MG tablet TAKE ONE TABLET BY MOUTH TWICE A DAY 180 tablet 3  . pravastatin (PRAVACHOL) 20 MG tablet TAKE ONE (1) TABLET BY MOUTH EVERY DAY 90 tablet 3  . sildenafil (VIAGRA) 100 MG tablet  Take 0.5-1 tablets (50-100 mg total) by mouth daily as needed for erectile dysfunction. 60 tablet 5  . sodium bicarbonate 650 MG tablet Take 650  mg by mouth 3 (three) times daily.    [name] triamcinolone ointment (KENALOG) 0.1 % Apply 1 application topically 2 (two) times daily. 30 g 0  . amLODipine-olmesartan (AZOR) 5-20 MG tablet      No facility-administered medications prior to visit.    No Known Allergies  ROS Review of Systems  All other systems reviewed and are negative.     Objective:    Physical Exam  Constitutional: He is oriented to person, place, and time. He appears well-developed and well-nourished.  HENT:  Head: Normocephalic.  Eyes: Pupils are equal, round, and reactive to light. Conjunctivae and EOM are normal.  Neck: No JVD present. Carotid bruit is not present. No thyromegaly present.  Cardiovascular: Normal rate, regular rhythm and normal heart sounds.  Pulmonary/Chest: Effort normal and breath sounds normal.  Abdominal: Soft. Bowel sounds are normal. He exhibits no distension and no abdominal bruit. There is no abdominal tenderness. There is no rebound.  Musculoskeletal:        General: Normal range of motion.     Cervical back: Normal range of motion and neck supple.  Lymphadenopathy:       Head (right side): Tonsillar adenopathy present. No submental, no submandibular, no preauricular, no posterior auricular and no occipital adenopathy present.       Head (left side): Tonsillar adenopathy present. No submental, no submandibular, no preauricular, no posterior auricular and no occipital adenopathy present.    He has no cervical adenopathy.  Neurological: He is alert and oriented to person, place, and time.  Skin: Skin is warm and dry.  Psychiatric: He has a normal mood and affect. His behavior is normal. Judgment and thought content normal.  Nursing note and vitals reviewed.   BP 130/62 (BP Location: Left Arm, Patient Position: Sitting, Cuff Size: Normal)    Pulse (!) 58   Temp 97.9 F (36.6 C) (Temporal)   Resp 18   Ht 5\' 8"  (1.727 m)   Wt 165 lb 3.2 oz (74.9 kg)   SpO2 98%   BMI 25.12 kg/m  Wt Readings from Last 3 Encounters:  12/21/19 165 lb 3.2 oz (74.9 kg)  12/07/19 160 lb (72.6 kg)  05/01/19 152 lb (68.9 kg)     Health Maintenance Due  Topic Date Due  . TETANUS/TDAP  10/02/2011  . OPHTHALMOLOGY EXAM  01/08/2018  . INFLUENZA VACCINE  05/02/2019  . HEMOGLOBIN A1C  11/01/2019    There are no preventive care reminders to display for this patient.  Lab Results  Component Value Date   TSH 2.481 09/08/2018   Lab Results  Component Value Date   WBC 7.3 05/01/2019   HGB 12.0 (L) 05/01/2019   HCT 36.0 (L) 05/01/2019   MCV 93.3 05/01/2019   PLT 193 05/01/2019   Lab Results  Component Value Date   NA 138 05/01/2019   K 5.0 05/01/2019   CO2 26 05/01/2019   GLUCOSE 102 (H) 05/01/2019   BUN 51 (H) 05/01/2019   CREATININE 2.84 (H) 05/01/2019   BILITOT 1.4 (H) 05/01/2019   ALKPHOS 64 02/18/2017   AST 15 05/01/2019   ALT 13 05/01/2019   PROT 7.2 05/01/2019   ALBUMIN 4.0 02/18/2017   CALCIUM 9.1 05/01/2019   Lab Results  Component Value Date   CHOL 126 05/01/2019   Lab Results  Component Value Date   HDL 37 (L) 05/01/2019   Lab Results  Component Value Date   LDLCALC 73 05/01/2019   Lab Results  Component Value Date  TRIG 75 05/01/2019   Lab Results  Component Value Date   CHOLHDL 3.4 05/01/2019   Lab Results  Component Value Date   HGBA1C 6.0 (H) 05/01/2019      Assessment & Plan:  Diagnoses most likely URI- Antibiotic, cough suppressant, get COVID test- [facility] number provided. HTN stable  Problem List Items Addressed This Visit    None    Visit Diagnoses    Cough    -  Primary   Relevant Medications   amoxicillin (AMOXIL) 500 MG capsule   HYDROcodone-homatropine (HYCODAN) 5-1.5 MG/5ML syrup   Upper respiratory tract infection, unspecified type       Relevant Medications   amoxicillin  (AMOXIL) 500 MG capsule   HYDROcodone-homatropine (HYCODAN) 5-1.5 MG/5ML syrup      Follow-up: Return if symptoms worsen or fail to improve.    Annie Main, FNP

## 2019-12-30 DIAGNOSIS — D638 Anemia in other chronic diseases classified elsewhere: Secondary | ICD-10-CM | POA: Diagnosis not present

## 2019-12-30 DIAGNOSIS — E211 Secondary hyperparathyroidism, not elsewhere classified: Secondary | ICD-10-CM | POA: Diagnosis not present

## 2019-12-30 DIAGNOSIS — I5032 Chronic diastolic (congestive) heart failure: Secondary | ICD-10-CM | POA: Diagnosis not present

## 2019-12-30 DIAGNOSIS — N189 Chronic kidney disease, unspecified: Secondary | ICD-10-CM | POA: Diagnosis not present

## 2019-12-30 DIAGNOSIS — R809 Proteinuria, unspecified: Secondary | ICD-10-CM | POA: Diagnosis not present

## 2020-01-04 DIAGNOSIS — E1129 Type 2 diabetes mellitus with other diabetic kidney complication: Secondary | ICD-10-CM | POA: Diagnosis not present

## 2020-01-04 DIAGNOSIS — R809 Proteinuria, unspecified: Secondary | ICD-10-CM | POA: Diagnosis not present

## 2020-01-04 DIAGNOSIS — E1122 Type 2 diabetes mellitus with diabetic chronic kidney disease: Secondary | ICD-10-CM | POA: Diagnosis not present

## 2020-01-04 DIAGNOSIS — N189 Chronic kidney disease, unspecified: Secondary | ICD-10-CM | POA: Diagnosis not present

## 2020-01-04 DIAGNOSIS — E211 Secondary hyperparathyroidism, not elsewhere classified: Secondary | ICD-10-CM | POA: Diagnosis not present

## 2020-01-06 DIAGNOSIS — E211 Secondary hyperparathyroidism, not elsewhere classified: Secondary | ICD-10-CM | POA: Diagnosis not present

## 2020-01-06 DIAGNOSIS — N170 Acute kidney failure with tubular necrosis: Secondary | ICD-10-CM | POA: Diagnosis not present

## 2020-01-06 DIAGNOSIS — N189 Chronic kidney disease, unspecified: Secondary | ICD-10-CM | POA: Diagnosis not present

## 2020-01-06 DIAGNOSIS — R809 Proteinuria, unspecified: Secondary | ICD-10-CM | POA: Diagnosis not present

## 2020-01-06 DIAGNOSIS — I5032 Chronic diastolic (congestive) heart failure: Secondary | ICD-10-CM | POA: Diagnosis not present

## 2020-01-11 DIAGNOSIS — R809 Proteinuria, unspecified: Secondary | ICD-10-CM | POA: Diagnosis not present

## 2020-01-11 DIAGNOSIS — N189 Chronic kidney disease, unspecified: Secondary | ICD-10-CM | POA: Diagnosis not present

## 2020-01-11 DIAGNOSIS — E1122 Type 2 diabetes mellitus with diabetic chronic kidney disease: Secondary | ICD-10-CM | POA: Diagnosis not present

## 2020-01-11 DIAGNOSIS — E1129 Type 2 diabetes mellitus with other diabetic kidney complication: Secondary | ICD-10-CM | POA: Diagnosis not present

## 2020-01-11 DIAGNOSIS — N170 Acute kidney failure with tubular necrosis: Secondary | ICD-10-CM | POA: Diagnosis not present

## 2020-01-13 ENCOUNTER — Other Ambulatory Visit (HOSPITAL_COMMUNITY): Payer: Self-pay | Admitting: Nephrology

## 2020-01-13 ENCOUNTER — Other Ambulatory Visit: Payer: Self-pay | Admitting: Nephrology

## 2020-01-13 DIAGNOSIS — N170 Acute kidney failure with tubular necrosis: Secondary | ICD-10-CM | POA: Diagnosis not present

## 2020-01-13 DIAGNOSIS — R809 Proteinuria, unspecified: Secondary | ICD-10-CM | POA: Diagnosis not present

## 2020-01-13 DIAGNOSIS — E211 Secondary hyperparathyroidism, not elsewhere classified: Secondary | ICD-10-CM | POA: Diagnosis not present

## 2020-01-13 DIAGNOSIS — E875 Hyperkalemia: Secondary | ICD-10-CM | POA: Diagnosis not present

## 2020-01-13 DIAGNOSIS — N189 Chronic kidney disease, unspecified: Secondary | ICD-10-CM | POA: Diagnosis not present

## 2020-01-18 ENCOUNTER — Other Ambulatory Visit: Payer: Self-pay

## 2020-01-18 ENCOUNTER — Ambulatory Visit (HOSPITAL_COMMUNITY)
Admission: RE | Admit: 2020-01-18 | Discharge: 2020-01-18 | Disposition: A | Discharge status: Home / Self Care | Payer: Medicare Other | Source: Ambulatory Visit | Attending: Nephrology | Admitting: Nephrology

## 2020-01-18 DIAGNOSIS — N170 Acute kidney failure with tubular necrosis: Secondary | ICD-10-CM | POA: Diagnosis not present

## 2020-01-18 DIAGNOSIS — N179 Acute kidney failure, unspecified: Secondary | ICD-10-CM | POA: Diagnosis not present

## 2020-01-18 DIAGNOSIS — E1129 Type 2 diabetes mellitus with other diabetic kidney complication: Secondary | ICD-10-CM | POA: Diagnosis not present

## 2020-01-18 DIAGNOSIS — E1122 Type 2 diabetes mellitus with diabetic chronic kidney disease: Secondary | ICD-10-CM | POA: Diagnosis not present

## 2020-01-18 DIAGNOSIS — R809 Proteinuria, unspecified: Secondary | ICD-10-CM | POA: Diagnosis not present

## 2020-01-18 DIAGNOSIS — N189 Chronic kidney disease, unspecified: Secondary | ICD-10-CM | POA: Diagnosis not present

## 2020-01-22 DIAGNOSIS — E875 Hyperkalemia: Secondary | ICD-10-CM | POA: Diagnosis not present

## 2020-01-22 DIAGNOSIS — R809 Proteinuria, unspecified: Secondary | ICD-10-CM | POA: Diagnosis not present

## 2020-01-22 DIAGNOSIS — N170 Acute kidney failure with tubular necrosis: Secondary | ICD-10-CM | POA: Diagnosis not present

## 2020-01-22 DIAGNOSIS — E211 Secondary hyperparathyroidism, not elsewhere classified: Secondary | ICD-10-CM | POA: Diagnosis not present

## 2020-01-22 DIAGNOSIS — N189 Chronic kidney disease, unspecified: Secondary | ICD-10-CM | POA: Diagnosis not present

## 2020-02-08 ENCOUNTER — Other Ambulatory Visit: Payer: Self-pay | Admitting: Family Medicine

## 2020-02-09 ENCOUNTER — Other Ambulatory Visit: Payer: Self-pay

## 2020-02-09 ENCOUNTER — Ambulatory Visit (INDEPENDENT_AMBULATORY_CARE_PROVIDER_SITE_OTHER): Payer: Medicare Other | Admitting: Family Medicine

## 2020-02-09 VITALS — BP 180/100 | HR 60 | Temp 96.5°F | Resp 12 | Ht 68.0 in | Wt 155.0 lb

## 2020-02-09 DIAGNOSIS — I10 Essential (primary) hypertension: Secondary | ICD-10-CM | POA: Diagnosis not present

## 2020-02-09 NOTE — Progress Notes (Signed)
Subjective:    Patient ID: Bradley Black, male    DOB: [birth year], 74 y.o.   MRN: [id number]  HPI Patient states that he developed an itchy rash in his groin yesterday.  He called for an appointment today however it was itching so bad last night that he took Benadryl.  However today the rash has improved.,  There are no erythematous bumps or papules.  There is no macules.  There are no vesicles.  There is no erythema.  There is no scaling or crusting.  The area where the rash is located is in the intertriginous folds of his inguinal canal bilaterally.  There is no erythema there.  There is no evidence of tinea or candida. Past Medical History:  Diagnosis Date  . Acute myocardial infarction, History of: 1988   PTCA of OM; PTCA   . Benign essential HTN    right shoulder  . BPH (benign prostatic hyperplasia)   . CAD S/P percutaneous coronary angioplasty    a. PTCA of [place] b. PCI with BMS to LAD in 1997 c. RCA PCI [place] d. s/p CABG in 11/2011 with LIMA-LAD, SVG-PDA, SVG-OM2, and SVG-D1  . Chronic back pain   . CKD (chronic kidney disease), stage III   . Cyst of bursa    R shoulder  . Diabetic retinopathy ([place])   . DM (diabetes mellitus), type 2 with renal complications ([place])   . GERD (gastroesophageal reflux disease)   . Hyperlipidemia LDL goal <70   . Ischemic cardiomyopathy 11/2011   Intra-OP TEE: EF 40-45%, no regional WMA; improved Anterior WM post CABG.  . 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 BACK SURGERY  1970  . CARDIAC CATHETERIZATION  2013  . CORONARY ANGIOPLASTY  1994   OM  . CORONARY ANGIOPLASTY  1997   LAD  . CORONARY ANGIOPLASTY WITH STENT PLACEMENT  1999   RCA  . CORONARY ANGIOPLASTY WITH STENT PLACEMENT  2001   RCA  . CORONARY ARTERY BYPASS GRAFT  12/27/2011   Procedure: CORONARY ARTERY BYPASS GRAFTING (CABG);  Surgeon: Rexene Alberts, MD;  Location: [facility];  Service: Open Heart Surgery;  Laterality: N/A;  Times four. On pump. Using endoscopically harvested right greater saphenous vein and left internal mammary artery.   [name] HERNIA REPAIR    . INCISION AND DRAINAGE OF WOUND  2006   [place]  . INTRAOPERATIVE TRANSESOPHAGEAL ECHOCARDIOGRAM  12/27/2011   Global hypokinesis with EF of 40-45%, improved LAD distribution wall motion.  [name] LEFT HEART CATHETERIZATION WITH CORONARY ANGIOGRAM N/A 12/13/2011   Procedure: LEFT HEART CATHETERIZATION WITH CORONARY ANGIOGRAM;  Surgeon: Leonie Man, MD;  Location: [facility] CATH LAB;  Service: Cardiovascular;  Laterality: N/A;  . LUMBAR FUSION    . MASS EXCISION  10/24/11   R arm  . TRANSESOPHAGEAL ECHOCARDIOGRAM  2013   Current Outpatient Medications on File Prior to Visit  Medication Sig Dispense Refill  . aspirin EC 81 MG tablet Take 81 mg by mouth daily.    . cinacalcet (SENSIPAR) 30 MG tablet Take 30 mg by mouth daily with breakfast.     . citalopram (CELEXA) 10 MG tablet TAKE ONE (1) TABLET BY MOUTH EVERY DAY 90 tablet 3  . doxazosin (CARDURA) 4 MG tablet TAKE ONE (1) TABLET BY MOUTH  EVERY DAY 90 tablet 3  . furosemide (LASIX) 20 MG tablet 20 mg. prn    . HYDROcodone-homatropine (HYCODAN) 5-1.5 MG/5ML syrup Take 5 mLs by mouth every 8 (eight) hours as needed for cough. 120 mL 0  . JANUVIA 50 MG tablet TAKE 1 TABLET BY MOUTH EVERY DAY (Patient taking differently: Take 25 mg by mouth daily. ) 90 tablet 2  . pantoprazole (PROTONIX) 40 MG tablet TAKE ONE TABLET BY MOUTH TWICE A DAY 180 tablet 3  . pravastatin (PRAVACHOL) 20 MG tablet TAKE ONE (1) TABLET BY MOUTH EVERY DAY 90 tablet 3  . sildenafil (VIAGRA) 100 MG tablet Take 0.5-1 tablets (50-100 mg total) by mouth daily as needed for erectile dysfunction. 60 tablet 5  . sodium bicarbonate 650 MG tablet Take 650 mg by mouth 3 (three) times daily.    [name] triamcinolone ointment (KENALOG) 0.1 % Apply 1 application topically 2 (two) times daily.  30 g 0   No current facility-administered medications on file prior to visit.   No Known Allergies Social History   Socioeconomic History  . Marital status: Married    Spouse name: Not on file  . Number of children: Not on file  . Years of education: Not on file  . Highest education level: Not on file  Occupational History  . Not on file  Tobacco Use  . Smoking status: Former Smoker    Types: Cigarettes    Quit date: 10/01/1986    Years since quitting: 33.3  . Smokeless tobacco: Never Used  . Tobacco comment: quit about 30 yrs ago  Substance and Sexual Activity  . Alcohol use: No  . Drug use: No  . 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 Not on file  Social History Narrative   Married father of one grandfather 31. Works out at [employer] at [employer] roughly 2-3 days a week. He works on a treadmill. He does note having a hard time getting his heart rate up.   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   He quit smoking in 1988, and does not drink alcohol.   Social Determinants of Health   Financial Resource Strain:   . Difficulty of Paying Living Expenses:   Food Insecurity:   . Worried About Charity fundraiser in the Last Year:   . Arboriculturist in the Last Year:   Transportation Needs:   . Film/video editor (Medical):   [name] Lack of Transportation (Non-Medical):   Physical Activity:   . Days of Exercise per Week:   . Minutes of Exercise per Session:   Stress:   . Feeling of Stress :   Social Connections:   . Frequency of Communication with Friends and Family:   . Frequency of Social Gatherings with Friends and Family:   . Attends Religious Services:   . Active Member of Clubs or Organizations:   . Attends Archivist Meetings:   [name] Marital Status:   Intimate Partner Violence:   . Fear of Current or Ex-Partner:   . Emotionally Abused:   [name] Physically Abused:    . Sexually Abused:       Review of Systems  All other systems reviewed and are negative.      Objective:   Physical Exam Vitals reviewed.  Constitutional:      Appearance: Normal appearance.  Cardiovascular:  Rate and Rhythm: Normal rate and regular rhythm.     Heart sounds: Normal heart sounds.  Pulmonary:     Effort: Pulmonary effort is normal.     Breath sounds: Normal breath sounds.  Skin:    Findings: No erythema or rash.  Neurological:     Mental Status: He is alert.           Assessment & Plan:  Essential hypertension  Blood pressure is extremely high today.  I recommended the patient resume his Azor that was recently discontinued this is nephrologist office and recheck his blood pressure post in 1 week.  There is no visible rash today on exam however I suspect either heat rash or possibly intertrigo.  I suggested the patient that he use triamcinolone cream twice daily as needed for the rash if it returns as I suspect heat rash given the evanescent nature of the rash.

## 2020-02-12 ENCOUNTER — Telehealth: Payer: Self-pay

## 2020-02-12 NOTE — Telephone Encounter (Signed)
Pt called and stated that he is taking 10 mg of Azor and his bp this morning 05/14 is 151/73.

## 2020-02-12 NOTE — Telephone Encounter (Signed)
What dose of azor is he taking?  BP is still too high.

## 2020-02-15 NOTE — Telephone Encounter (Signed)
10 mg daily

## 2020-02-16 ENCOUNTER — Other Ambulatory Visit: Payer: Self-pay

## 2020-02-16 DIAGNOSIS — E1122 Type 2 diabetes mellitus with diabetic chronic kidney disease: Secondary | ICD-10-CM | POA: Diagnosis not present

## 2020-02-16 DIAGNOSIS — N189 Chronic kidney disease, unspecified: Secondary | ICD-10-CM | POA: Diagnosis not present

## 2020-02-16 DIAGNOSIS — I5032 Chronic diastolic (congestive) heart failure: Secondary | ICD-10-CM | POA: Diagnosis not present

## 2020-02-16 DIAGNOSIS — E1129 Type 2 diabetes mellitus with other diabetic kidney complication: Secondary | ICD-10-CM | POA: Diagnosis not present

## 2020-02-16 DIAGNOSIS — E211 Secondary hyperparathyroidism, not elsewhere classified: Secondary | ICD-10-CM | POA: Diagnosis not present

## 2020-02-16 MED ORDER — DOXAZOSIN MESYLATE 8 MG PO TABS: 8.0000 mg | ORAL_TABLET | Freq: Every day | ORAL | 3 refills | Status: DC

## 2020-02-16 NOTE — Telephone Encounter (Signed)
New Rx sent to pharmacy

## 2020-02-16 NOTE — Telephone Encounter (Signed)
Please up doxazosin to 8 mg poqday for htn.

## 2020-02-17 ENCOUNTER — Other Ambulatory Visit: Payer: Self-pay | Admitting: Family Medicine

## 2020-02-17 MED ORDER — DOXAZOSIN MESYLATE 8 MG PO TABS: 8.0000 mg | ORAL_TABLET | Freq: Every day | ORAL | 3 refills | Status: DC

## 2020-02-19 DIAGNOSIS — E211 Secondary hyperparathyroidism, not elsewhere classified: Secondary | ICD-10-CM | POA: Diagnosis not present

## 2020-02-19 DIAGNOSIS — N189 Chronic kidney disease, unspecified: Secondary | ICD-10-CM | POA: Diagnosis not present

## 2020-02-19 DIAGNOSIS — R809 Proteinuria, unspecified: Secondary | ICD-10-CM | POA: Diagnosis not present

## 2020-02-19 DIAGNOSIS — E875 Hyperkalemia: Secondary | ICD-10-CM | POA: Diagnosis not present

## 2020-02-19 DIAGNOSIS — N170 Acute kidney failure with tubular necrosis: Secondary | ICD-10-CM | POA: Diagnosis not present

## 2020-03-01 ENCOUNTER — Encounter: Payer: Self-pay | Admitting: Internal Medicine

## 2020-03-03 DIAGNOSIS — R809 Proteinuria, unspecified: Secondary | ICD-10-CM | POA: Diagnosis not present

## 2020-03-03 DIAGNOSIS — N170 Acute kidney failure with tubular necrosis: Secondary | ICD-10-CM | POA: Diagnosis not present

## 2020-03-03 DIAGNOSIS — N189 Chronic kidney disease, unspecified: Secondary | ICD-10-CM | POA: Diagnosis not present

## 2020-03-03 DIAGNOSIS — E1129 Type 2 diabetes mellitus with other diabetic kidney complication: Secondary | ICD-10-CM | POA: Diagnosis not present

## 2020-03-03 DIAGNOSIS — E1122 Type 2 diabetes mellitus with diabetic chronic kidney disease: Secondary | ICD-10-CM | POA: Diagnosis not present

## 2020-03-04 ENCOUNTER — Ambulatory Visit: Payer: Self-pay | Admitting: *Deleted

## 2020-03-04 ENCOUNTER — Encounter (HOSPITAL_COMMUNITY): Payer: Self-pay

## 2020-03-04 ENCOUNTER — Other Ambulatory Visit: Payer: Self-pay

## 2020-03-04 ENCOUNTER — Encounter (HOSPITAL_COMMUNITY)
Admission: RE | Admit: 2020-03-04 | Discharge: 2020-03-04 | Disposition: A | Discharge status: Home / Self Care | Payer: Medicare Other | Source: Ambulatory Visit | Attending: Nephrology | Admitting: Nephrology

## 2020-03-04 DIAGNOSIS — D509 Iron deficiency anemia, unspecified: Secondary | ICD-10-CM | POA: Insufficient documentation

## 2020-03-04 MED ORDER — FERUMOXYTOL INJECTION 510 MG/17 ML
510.0000 mg | Freq: Once | INTRAVENOUS | Status: AC
  Administered 2020-03-04: 510 mg via INTRAVENOUS
  Filled 2020-03-04: qty 510

## 2020-03-04 MED ORDER — SODIUM CHLORIDE 0.9 % IV SOLN: Freq: Once | INTRAVENOUS | Status: AC

## 2020-03-11 ENCOUNTER — Encounter (HOSPITAL_COMMUNITY)
Admission: RE | Admit: 2020-03-11 | Discharge: 2020-03-11 | Disposition: A | Discharge status: Home / Self Care | Payer: Medicare Other | Source: Ambulatory Visit | Attending: Nephrology | Admitting: Nephrology

## 2020-03-11 ENCOUNTER — Encounter (HOSPITAL_COMMUNITY): Payer: Self-pay

## 2020-03-11 ENCOUNTER — Other Ambulatory Visit: Payer: Self-pay

## 2020-03-11 DIAGNOSIS — D509 Iron deficiency anemia, unspecified: Secondary | ICD-10-CM | POA: Diagnosis not present

## 2020-03-11 MED ORDER — SODIUM CHLORIDE 0.9 % IV SOLN
510.0000 mg | Freq: Once | INTRAVENOUS | Status: AC
  Administered 2020-03-11: 510 mg via INTRAVENOUS
  Filled 2020-03-11: qty 17

## 2020-03-11 MED ORDER — SODIUM CHLORIDE 0.9 % IV SOLN: Freq: Once | INTRAVENOUS | Status: AC

## 2020-03-22 ENCOUNTER — Other Ambulatory Visit: Payer: Self-pay | Admitting: *Deleted

## 2020-03-22 NOTE — Patient Outreach (Signed)
[name] Care Management  03/22/2020  Bradley Black 08-Feb-1946 [id number]   [name] [facility]ly Outreach  Referral Date:  11/10/2019 Referral Source:  Transfer from [place] Reason for Referral:  Continued Disease Management Education Insurance:  [employer]   Outreach Attempt:  Outreach attempt #1 to patient for follow up. No answer. RN Health Coach left HIPAA compliant voicemail message along with contact information.  Plan:  RN Health Coach will make another outreach attempt within the month of July if no return call back from patient.   [name] Coach [phone] [email or web address]

## 2020-04-13 ENCOUNTER — Other Ambulatory Visit: Payer: Self-pay

## 2020-04-13 ENCOUNTER — Ambulatory Visit: Payer: Medicare Other | Admitting: Gastroenterology

## 2020-04-13 ENCOUNTER — Encounter: Payer: Self-pay | Admitting: Gastroenterology

## 2020-04-13 DIAGNOSIS — K219 Gastro-esophageal reflux disease without esophagitis: Secondary | ICD-10-CM

## 2020-04-13 DIAGNOSIS — D509 Iron deficiency anemia, unspecified: Secondary | ICD-10-CM | POA: Diagnosis not present

## 2020-04-13 NOTE — Patient Instructions (Signed)
1. We will be in touch to schedule colonoscopy and possible upper endoscopy once we speak to Dr. Theador Hawthorne regarding bowel prep safety.

## 2020-04-13 NOTE — Progress Notes (Addendum)
Primary Care Physician:  Susy Frizzle, MD  Primary Gastroenterologist:  Garfield Cornea, MD   Chief Complaint  Patient presents with  . Anemia    HPI:  Bradley Black is a 74 y.o. male here at the request of Dr. Theador Hawthorne for further evaluation of iron deficiency anemia.  Patient was last seen in 2014 for IDA/anemia of chronic disease.  He had heme positive stools as well.  At that time his prior colonoscopy was over 10 years ago.  He believes he has a history of [place] polyps.  He was advised need for EGD and colonoscopy but patient declined.   Labs back in May 2021 through care everywhere: Iron 47, TIBC 281, iron saturation 17%, hemoglobin 10.9, hematocrit 33.6, MCV 92.8, platelets 200,000, creatinine 2.58, BUN 45.  Labs from January 2021 with iron of 57, iron saturations 22%.  Labs March 03, 2020 with BUN of 56, creatinine 3.28, estimated GFR of 17  Received Feraheme x2 back in June.  BM not regular. May skip few days without BM, some days has 2-3 stools. Does have hard stools at time. No melena, brbpr. No heartburn on pantoprazole but requires twice a day to control epigastric burning. No dysphagia. No vomiting. No abdominal pain. No fluid restrictions per patient.   No prior EGD. Cologuard negative in 01/2018.    Covid vaccine 11/2019.    Current Outpatient Medications  Medication Sig Dispense Refill  . aspirin EC 81 MG tablet Take 81 mg by mouth daily.    . cinacalcet (SENSIPAR) 30 MG tablet Take 30 mg by mouth daily with breakfast.     . citalopram (CELEXA) 10 MG tablet TAKE ONE (1) TABLET BY MOUTH EVERY DAY 90 tablet 3  . doxazosin (CARDURA) 8 MG tablet Take 1 tablet (8 mg total) by mouth daily. 90 tablet 3  . furosemide (LASIX) 20 MG tablet 20 mg. prn    . JANUVIA 50 MG tablet TAKE 1 TABLET BY MOUTH EVERY DAY (Patient taking differently: Take 25 mg by mouth daily. ) 90 tablet 2  . pantoprazole (PROTONIX) 40 MG tablet TAKE ONE TABLET BY MOUTH TWICE A DAY 180 tablet 3  .  pravastatin (PRAVACHOL) 20 MG tablet TAKE ONE (1) TABLET BY MOUTH EVERY DAY 90 tablet 3  . sildenafil (VIAGRA) 100 MG tablet Take 0.5-1 tablets (50-100 mg total) by mouth daily as needed for erectile dysfunction. 60 tablet 5  . sodium bicarbonate 650 MG tablet Take 650 mg by mouth 3 (three) times daily.     No current facility-administered medications for this visit.    Allergies as of 04/13/2020  . (No Known Allergies)    Past Medical History:  Diagnosis Date  . Acute myocardial infarction, History of: 1988   PTCA of OM; PTCA   . Benign essential HTN    right shoulder  . BPH (benign prostatic hyperplasia)   . CAD S/P percutaneous coronary angioplasty    a. PTCA of [place] b. PCI with BMS to LAD in 1997 c. RCA PCI [place] d. s/p CABG in 11/2011 with LIMA-LAD, SVG-PDA, SVG-OM2, and SVG-D1  . Chronic back pain   . CKD (chronic kidney disease), stage III   . Cyst of bursa    R shoulder  . Diabetic retinopathy ([place])   . DM (diabetes mellitus), type 2 with renal complications ([place])   . GERD (gastroesophageal reflux disease)   . Hyperlipidemia LDL goal <70   . Ischemic cardiomyopathy 11/2011  Intra-OP TEE: EF 40-45%, no regional WMA; improved Anterior WM post CABG.  . 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 BACK SURGERY  1970  . CARDIAC CATHETERIZATION  2013  . CORONARY ANGIOPLASTY  1994   OM  . CORONARY ANGIOPLASTY  1997   LAD  . CORONARY ANGIOPLASTY WITH STENT PLACEMENT  1999   RCA  . CORONARY ANGIOPLASTY WITH STENT PLACEMENT  2001   RCA  . CORONARY ARTERY BYPASS GRAFT  12/27/2011   Procedure: CORONARY ARTERY BYPASS GRAFTING (CABG);  Surgeon: Rexene Alberts, MD;  Location: [facility];  Service: Open Heart Surgery;  Laterality: N/A;  Times four. On pump. Using endoscopically harvested right greater saphenous vein and left internal mammary artery.    [name] HERNIA REPAIR    . INCISION AND DRAINAGE OF WOUND  2006   axilla  . INTRAOPERATIVE TRANSESOPHAGEAL ECHOCARDIOGRAM  12/27/2011   Global hypokinesis with EF of 40-45%, improved LAD distribution wall motion.  [name] LEFT HEART CATHETERIZATION WITH CORONARY ANGIOGRAM N/A 12/13/2011   Procedure: LEFT HEART CATHETERIZATION WITH CORONARY ANGIOGRAM;  Surgeon: Leonie Man, MD;  Location: [facility] CATH LAB;  Service: Cardiovascular;  Laterality: N/A;  . LUMBAR FUSION    . MASS EXCISION  10/24/11   R arm  . TRANSESOPHAGEAL ECHOCARDIOGRAM  2013    Family History  Problem Relation Age of Onset  . Cancer Mother        breast  . Cancer Father        bone  . Bohdan cancer Neg Hx     Social History   Socioeconomic History  . Marital status: Married    Spouse name: Not on file  . Number of children: Not on file  . Years of education: Not on file  . Highest education level: Not on file  Occupational History  . Not on file  Tobacco Use  . Smoking status: Former Smoker    Types: Cigarettes    Quit date: 10/01/1986    Years since quitting: 33.5  . Smokeless tobacco: Never Used  . Tobacco comment: quit about 30 yrs ago  Vaping Use  . Vaping Use: Never used  Substance and Sexual Activity  . Alcohol use: No  . Drug use: No  . 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 Not on file  Social History Narrative   Married father of one grandfather 68. Works out at [employer] at [employer] roughly 2-3 days a week. He works on a treadmill. He does note having a hard time getting his heart rate up.   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   He quit smoking in 1988, and does not drink alcohol.   Social Determinants of Health   Financial Resource Strain:   . Difficulty of Paying Living Expenses:   Food Insecurity:   . Worried About Charity fundraiser in the Last Year:   . Arboriculturist in the Last Year:    Transportation Needs:   . Film/video editor (Medical):   [name] Lack of Transportation (Non-Medical):   Physical Activity:   . Days of Exercise per Week:   . Minutes of Exercise per Session:   Stress:   .  Feeling of Stress :   Social Connections:   . Frequency of Communication with Friends and Family:   . Frequency of Social Gatherings with Friends and Family:   . Attends Religious Services:   . Active Member of Clubs or Organizations:   . Attends Archivist Meetings:   [name] Marital Status:   Intimate Partner Violence:   . Fear of Current or Ex-Partner:   . Emotionally Abused:   [name] Physically Abused:   . Sexually Abused:       ROS:  General: Negative for anorexia, weight loss, fever, chills, fatigue, weakness. Eyes: Negative for vision changes.  ENT: Negative for hoarseness, difficulty swallowing , nasal congestion. CV: Negative for chest pain, angina, palpitations, dyspnea on exertion, peripheral edema.  Respiratory: Negative for dyspnea at rest, dyspnea on exertion, cough, sputum, wheezing.  GI: See history of present illness. GU:  Negative for dysuria, hematuria, urinary incontinence, urinary frequency, nocturnal urination.  MS: Negative for joint pain, low back pain.  Derm: Negative for rash or itching.  Neuro: Negative for weakness, abnormal sensation, seizure, frequent headaches, memory loss, confusion.  Psych: Negative for anxiety, depression, suicidal ideation, hallucinations.  Endo: Negative for unusual weight change.  Heme: Negative for bruising or bleeding. Allergy: Negative for rash or hives.    Physical Examination:  BP (!) 151/73   Temp (!) 97.5 F (36.4 C) (Oral)   Ht 5\' 8"  (1.727 m)   Wt 153 lb 6.4 oz (69.6 kg)   BMI 23.32 kg/m    General: Well-nourished, well-developed in no acute distress.  Head: Normocephalic, atraumatic.   Eyes: Conjunctiva pink, no icterus. Mouth: masked Neck: Supple without thyromegaly, masses, or lymphadenopathy.   Lungs: Clear to auscultation bilaterally.  Heart: Regular rate and rhythm, no murmurs rubs or gallops.  Abdomen: Bowel sounds are normal, nontender, nondistended, no hepatosplenomegaly or masses, no abdominal bruits or    hernia , no rebound or guarding.   Rectal: not performed Extremities: No lower extremity edema. No clubbing or deformities.  Neuro: Alert and oriented x 4 , grossly normal neurologically.  Skin: Warm and dry, no rash or jaundice.   Psych: Alert and cooperative, normal mood and affect.   Impression/Plan:  Pleasant 74 year old gentleman with history of iron deficiency anemia/anemia of chronic disease presenting for follow-up.  He was seen for the same in 2014, heme positive stools at that time.  Patient declined EGD and colonoscopy back then.  Recently began receiving iron infusions.  Iron saturations low at 17% but declined since January from 22%.  Ferritin down to 47.  Further decline in renal function with recent creatinine over 3-1/2.  Estimated GFR are of 17%.  Really void of any type of GI symptoms.  He is on chronic PPI twice daily for reflux and epigastric pain.  Prior history of Pietro polyps in the remote past. Given this as well as iron deficiency, would offer patient colonoscopy with upper endoscopy in the near future.  Patient states previously he was unable to tolerate Trilyte due to vomiting.  He would be willing to try a different prep.  Explained to the patient because of his renal insufficiency, prep choice is limited.  We are reaching out to Dr. Theador Hawthorne regarding possibility of using either Suprep, Plenvu, or MiraLAX prep with regards to safety for his renal disease.  We will schedule him in the near future once information obtained. Plan for conscious sedation. ASA III.  I have discussed the risks, alternatives, benefits with regards to  but not limited to the risk of reaction to medication, bleeding, infection, perforation and the patient is agreeable to proceed.  Written consent to be obtained.

## 2020-04-14 ENCOUNTER — Telehealth: Payer: Self-pay | Admitting: Gastroenterology

## 2020-04-14 NOTE — Telephone Encounter (Signed)
Please contact patient's nephrologist, Dr. Theador Hawthorne, and find out if they are okay with [place] using any of the following bowel preps for colonoscopy given his renal insufficiency. Patient previously vomited with trilyte and refused trying again.   Suprep Miralax whole bottle Plenvu

## 2020-04-14 NOTE — Telephone Encounter (Signed)
Lm for Dr. Toya Smothers nurse to call me back. Waiting on a return call.

## 2020-04-18 DIAGNOSIS — N170 Acute kidney failure with tubular necrosis: Secondary | ICD-10-CM | POA: Diagnosis not present

## 2020-04-18 DIAGNOSIS — E1129 Type 2 diabetes mellitus with other diabetic kidney complication: Secondary | ICD-10-CM | POA: Diagnosis not present

## 2020-04-18 DIAGNOSIS — R809 Proteinuria, unspecified: Secondary | ICD-10-CM | POA: Diagnosis not present

## 2020-04-18 DIAGNOSIS — N189 Chronic kidney disease, unspecified: Secondary | ICD-10-CM | POA: Diagnosis not present

## 2020-04-18 DIAGNOSIS — E1122 Type 2 diabetes mellitus with diabetic chronic kidney disease: Secondary | ICD-10-CM | POA: Diagnosis not present

## 2020-04-18 NOTE — Telephone Encounter (Signed)
Called the office of Dr. Theador Hawthorne, waiting on a return call.

## 2020-04-20 NOTE — Telephone Encounter (Signed)
Received a call back. Bradley Black from Dr. Theador Hawthorne apologized for just getting back with me. Per Dr. Theador Hawthorne, pt is ok to use Miralax(whole Bottle)

## 2020-04-20 NOTE — Telephone Encounter (Signed)
Spoke with the receptionist. She has taken a message for Dr. Theador Hawthorne and will contact me back about pts prep.

## 2020-04-20 NOTE — Telephone Encounter (Signed)
Will call pt to schedule TCS/EGD when RMR's September schedule is available.

## 2020-04-20 NOTE — Telephone Encounter (Signed)
Please schedule for TCS/EGD with RMR with conscious sedation for Dx: IDA, chronic GERD, epig pain. ASA III  -->Day of prep: januvia 1/2 dose daily. -->Patient refused trilyte. Nephrologist ok with miralax prep EXCEPT INSTEAD OF FLEET ENEMA HE WILL NEED TAP WATER ENEMS. -->Let's also augment his prep by giving him linzess 267mcg daily for five days before prep starts. May give samples. Pt may hold linzess a day if he is having diarrhea.

## 2020-04-21 NOTE — Telephone Encounter (Signed)
Lmom, waiting on a return call. Will notify pt that he will receive a call to schedule his procedure when RMR's schedule is open.

## 2020-04-22 ENCOUNTER — Telehealth: Payer: Self-pay | Admitting: Student

## 2020-04-22 ENCOUNTER — Other Ambulatory Visit: Payer: Self-pay | Admitting: *Deleted

## 2020-04-22 DIAGNOSIS — E211 Secondary hyperparathyroidism, not elsewhere classified: Secondary | ICD-10-CM | POA: Diagnosis not present

## 2020-04-22 DIAGNOSIS — N170 Acute kidney failure with tubular necrosis: Secondary | ICD-10-CM | POA: Diagnosis not present

## 2020-04-22 DIAGNOSIS — N189 Chronic kidney disease, unspecified: Secondary | ICD-10-CM | POA: Diagnosis not present

## 2020-04-22 DIAGNOSIS — E875 Hyperkalemia: Secondary | ICD-10-CM | POA: Diagnosis not present

## 2020-04-22 DIAGNOSIS — R809 Proteinuria, unspecified: Secondary | ICD-10-CM | POA: Diagnosis not present

## 2020-04-22 NOTE — Patient Outreach (Signed)
[facility]) Care Management  04/22/2020  Wallace L Machnik 1945-12-10 [id number]   [place] Quarterly Outreach  Referral Date: 11/10/2019 Referral Source: Transfer from [place] Reason for Referral: Continued Disease Management Education Insurance: [employer]   Outreach Attempt:  Outreach attempt #2 to patient for follow up.  Noted in chart patient with heart rate problems this morning at Renal office.  Upon chart review, Cardiologist recommending patient go to the emergency room.  Patient answered and stated he was awaiting telephone call back from Cardiologist office.  States he feels a little better this afternoon than this morning but still some weak.  Discussed with patient Dr. Nelly Laurence recommendation to go to the emergency room.  Encouraged patient to call Cardiology office for emergency room confirmation recommendation; patient stated he would.  Plan:  RN Health Coach will make another outreach attempt within the next 10 business days if patient not admitted to hospital.   [place] [phone] [email or web address]

## 2020-04-22 NOTE — Telephone Encounter (Signed)
If rates as low as 30s and symptomatic would need to come to the ER. He is not on any medications that slow the heart rate down, so if really that low then would not be a simple management that could be done in the office   Zandra Abts MD

## 2020-04-22 NOTE — Telephone Encounter (Signed)
Per Janet-Dr.Butoni's office states Pt's HR is very unstable   Please call-Janet [phone] option#3   Thanks renee

## 2020-04-22 NOTE — Telephone Encounter (Signed)
Spoke with pt who states that he is weak. Marcie Bal reports BP at 112/60 and HR in the 30's to the 60's and irregular. Pt states that he can come to office this am for EKG. Please advise.

## 2020-04-27 ENCOUNTER — Telehealth: Payer: Self-pay | Admitting: Cardiovascular Disease

## 2020-04-27 NOTE — Telephone Encounter (Signed)
    I spoke with Thayer Headings at [facility].They are stating patient heart rate is quite variable (30's-60's) .They request patient to be evaluated by cardiology.   Apt made for 7/29 at 1130 am with Katina Dung, NP. Thayer Headings at [facility] will call patient.

## 2020-04-27 NOTE — Telephone Encounter (Signed)
New message     Thayer Headings is requesting a call back from [facility] regarding this patient

## 2020-04-27 NOTE — Telephone Encounter (Signed)
I spoke with [facility] , Thayer Headings states she told patient to go to ED if he became symptomatic

## 2020-04-28 ENCOUNTER — Telehealth: Payer: Self-pay | Admitting: Family Medicine

## 2020-04-28 ENCOUNTER — Ambulatory Visit: Payer: Medicare Other | Admitting: Family Medicine

## 2020-04-28 ENCOUNTER — Encounter: Payer: Self-pay | Admitting: Family Medicine

## 2020-04-28 VITALS — BP 160/78 | HR 62 | Ht 68.0 in | Wt 156.8 lb

## 2020-04-28 DIAGNOSIS — R001 Bradycardia, unspecified: Secondary | ICD-10-CM

## 2020-04-28 DIAGNOSIS — I10 Essential (primary) hypertension: Secondary | ICD-10-CM

## 2020-04-28 DIAGNOSIS — D509 Iron deficiency anemia, unspecified: Secondary | ICD-10-CM

## 2020-04-28 DIAGNOSIS — N184 Chronic kidney disease, stage 4 (severe): Secondary | ICD-10-CM | POA: Diagnosis not present

## 2020-04-28 DIAGNOSIS — I2510 Atherosclerotic heart disease of native coronary artery without angina pectoris: Secondary | ICD-10-CM

## 2020-04-28 DIAGNOSIS — R0989 Other specified symptoms and signs involving the circulatory and respiratory systems: Secondary | ICD-10-CM

## 2020-04-28 DIAGNOSIS — I779 Disorder of arteries and arterioles, unspecified: Secondary | ICD-10-CM

## 2020-04-28 MED ORDER — HYDRALAZINE HCL 50 MG PO TABS
50.0000 mg | ORAL_TABLET | Freq: Two times a day (BID) | ORAL | 6 refills | Status: DC
Start: 2020-04-28 — End: 2020-05-14

## 2020-04-28 NOTE — Patient Instructions (Addendum)
Medication Instructions:   Begin Hydralazine 50mg  twice a day.  Continue all other medications.    Labwork: none  Testing/Procedures:  Your physician has requested that you have a carotid duplex. This test is an ultrasound of the carotid arteries in your neck. It looks at blood flow through these arteries that supply the brain with blood. Allow one hour for this exam. There are no restrictions or special instructions.  Your physician has recommended that you wear a 14 day event monitor. Event monitors are medical devices that record the heart's electrical activity. Doctors most often [place] these monitors to diagnose arrhythmias. Arrhythmias are problems with the speed or rhythm of the heartbeat. The monitor is a small, portable device. You can wear one while you do your normal daily activities. This is usually used to diagnose what is causing palpitations/syncope (passing out).  Office will contact with results via phone or letter.    Follow-Up: 1 month   Any Other Special Instructions Will Be Listed Below (If Applicable).  2 week nurse blood pressure check   Reminder to check blood pressure every other day.   If you need a refill on your cardiac medications before your next appointment, please call your pharmacy.

## 2020-04-28 NOTE — Telephone Encounter (Signed)
Pre-cert Verification for the following procedure     ZIO 14 day event monitor

## 2020-04-28 NOTE — Progress Notes (Addendum)
Cardiology Office Note  Date: 04/28/2020   ID: Bradley Black, Lal 07-Apr-1946, MRN [id number]  PCP:  Susy Frizzle, MD  Cardiologist:  Kate Sable, MD (Inactive) Electrophysiologist:  None   Chief Complaint: Follow-up CAD  History of Present Illness: Bradley Black is a 74 y.o. 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  (Blockage in left neck artery has worsened. Not yet at the point he needs surgery but we need to start watching it more closely. Recheck in 6 months)  Last encounter with Dr. Bronson Ing 12/07/2019 via telemedicine.  He was having more feet swelling.  He was trying to avoid salt.  He denied any chest pain, shortness of breath, dizziness, palpitations.  Amlodipine was increased due to increased blood pressure.  Statin therapy was continued.  He was following with nephrology for chronic kidney disease recent creatinine was 2.3.  He was asked to speak with his nephrologist regarding diuretic therapy.  He stated it might help to take at least 2 days of Lasix.  Not on AV nodal blocking agents due to bradycardia.  Patient is here status post recent episode of heart rate in the 30s to 60s during visit to [facility].  They requested the patient be evaluated by cardiology.  Patient states during this episode he was feeling significantly tired and dizzy with fatigue.  He has a history of bradycardia.  Recent history of anemia and is [facility].  States he received an iron infusion recently.  He has a pending colonoscopy which has not been scheduled as of yet.  He denies any frank blood or dark tarry stools  tools.  He denies any palpitations or arrhythmias, CVA or TIA-like symptoms, PND, orthopnea, syncopal episodes.  Denies any claudication-like symptoms, DVT or PE-like symptoms, lower extremity edema.  Denies any anginal or exertional symptoms.  Blood pressure is elevated today on arrival..  States his nephrologist stopped the amlodipine which was increased at last visit with Dr. Bronson Ing.   Past Medical History:  Diagnosis Date  . Acute myocardial infarction, History of: 1988   PTCA of OM; PTCA   . Benign essential HTN    right shoulder  . BPH (benign prostatic hyperplasia)   . CAD S/P percutaneous coronary angioplasty    a. PTCA of [place] b. PCI with BMS to LAD in 1997 c. RCA PCI [place] d. s/p CABG in 11/2011 with LIMA-LAD, SVG-PDA, SVG-OM2, and SVG-D1  . Chronic back pain   . CKD (chronic kidney disease), stage III   . Cyst of bursa    R shoulder  . Diabetic retinopathy ([place])   . DM (diabetes mellitus), type 2 with renal complications ([place])   . GERD (gastroesophageal reflux disease)   . Hyperlipidemia LDL goal <70   . Ischemic cardiomyopathy 11/2011   Intra-OP TEE: EF 40-45%, no regional WMA; improved Anterior WM post CABG.  . 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 BACK SURGERY  1970  . CARDIAC CATHETERIZATION  2013  .  CORONARY ANGIOPLASTY  1994   OM  . CORONARY ANGIOPLASTY  1997   LAD  . CORONARY ANGIOPLASTY WITH STENT PLACEMENT  1999   RCA  . CORONARY ANGIOPLASTY WITH STENT PLACEMENT  2001   RCA  . CORONARY ARTERY BYPASS GRAFT  12/27/2011   Procedure: CORONARY ARTERY BYPASS GRAFTING (CABG);  Surgeon: Rexene Alberts, MD;  Location: [facility];  Service: Open Heart Surgery;  Laterality: N/A;  Times four. On pump. Using endoscopically harvested right greater saphenous vein and left internal mammary artery.   [name] HERNIA REPAIR    . INCISION AND  DRAINAGE OF WOUND  2006   [place]  . INTRAOPERATIVE TRANSESOPHAGEAL ECHOCARDIOGRAM  12/27/2011   Global hypokinesis with EF of 40-45%, improved LAD distribution wall motion.  [name] LEFT HEART CATHETERIZATION WITH CORONARY ANGIOGRAM N/A 12/13/2011   Procedure: LEFT HEART CATHETERIZATION WITH CORONARY ANGIOGRAM;  Surgeon: Leonie Man, MD;  Location: [facility] CATH LAB;  Service: Cardiovascular;  Laterality: N/A;  . LUMBAR FUSION    . MASS EXCISION  10/24/11   R arm  . TRANSESOPHAGEAL ECHOCARDIOGRAM  2013    Current Outpatient Medications  Medication Sig Dispense Refill  . aspirin EC 81 MG tablet Take 81 mg by mouth daily.    . cinacalcet (SENSIPAR) 30 MG tablet Take 30 mg by mouth daily with breakfast.     . citalopram (CELEXA) 10 MG tablet TAKE ONE (1) TABLET BY MOUTH EVERY DAY 90 tablet 3  . doxazosin (CARDURA) 8 MG tablet Take 1 tablet (8 mg total) by mouth daily. 90 tablet 3  . furosemide (LASIX) 20 MG tablet 20 mg. prn    . pantoprazole (PROTONIX) 40 MG tablet TAKE ONE TABLET BY MOUTH TWICE A DAY 180 tablet 3  . pravastatin (PRAVACHOL) 20 MG tablet TAKE ONE (1) TABLET BY MOUTH EVERY DAY 90 tablet 3  . sildenafil (VIAGRA) 100 MG tablet Take 0.5-1 tablets (50-100 mg total) by mouth daily as needed for erectile dysfunction. 60 tablet 5  . sitaGLIPtin (JANUVIA) 50 MG tablet Take 25 mg by mouth daily.    . sodium bicarbonate 650 MG tablet Take 650 mg by mouth 3 (three) times daily.    . hydrALAZINE (APRESOLINE) 50 MG tablet Take 1 tablet (50 mg total) by mouth 2 (two) times daily. 60 tablet 6   No current facility-administered medications for this visit.   Allergies:  Patient has no known allergies.   Social History: The patient  reports that he quit smoking about 33 years ago. His smoking use included cigarettes. He has never used smokeless tobacco. He reports that he does not drink alcohol and does not use drugs.   Family History: The patient's family history includes Cancer in his father and  mother.   ROS:  Please see the history of present illness. Otherwise, complete review of systems is positive for none.  All other systems are reviewed and negative.   Physical Exam: VS:  BP (!) 160/78   Pulse 62   Ht _0  (1.727 m)   Wt 156 lb 12.8 oz (71.1 kg)   SpO2 97%   BMI 23.84 kg/m , BMI Body mass index is 23.84 kg/m.  Wt Readings from Last 3 Encounters:  04/28/20 156 lb 12.8 oz (71.1 kg)  04/13/20 153 lb 6.4 oz (69.6 kg)  03/04/20 154 lb 15.7 oz (70.3 kg)    General: Patient appears comfortable at rest. Neck: Supple, no elevated JVP, positive for bilateral  carotid bruits, no thyromegaly. Lungs: Clear  to auscultation, nonlabored breathing at rest. Cardiac: Regular rate and rhythm, no S3 or significant systolic murmur, no pericardial rub. Extremities: No pitting edema, distal pulses 2+. Skin: Warm and dry. Musculoskeletal: No kyphosis. Neuropsychiatric: Alert and oriented x3, affect grossly appropriate.  ECG:  An ECG dated 04/28/2020 was personally reviewed today and demonstrated:  Sinus rhythm rate of 66, frequent ectopic ventricular beats, PACs x2, LAE, old inferior infarct, old anterior infarct, nonspecific T wave abnormality.  Recent Labwork: 05/01/2019: ALT 13; AST 15; BUN 51; Creat 2.84; Hemoglobin 12.0; Platelets 193; Potassium 5.0; Sodium 138     Component Value Date/Time   CHOL 126 05/01/2019 0945   TRIG 75 05/01/2019 0945   HDL 37 (L) 05/01/2019 0945   CHOLHDL 3.4 05/01/2019 0945   VLDL 13 01/22/2017 1007   LDLCALC 73 05/01/2019 0945    Other Studies Reviewed Today:  Cardiac Event Monitor: 10/2015  Predominantly sinus rhythm with PVC, asymptomatic.  Isolated episode of sinus tachycardia, HR 120 bpm.  Echocardiogram: 11/2015 Study Conclusions  - Left ventricle: The cavity size was normal. Wall thickness was normal. The estimated ejection fraction was 45%. There is hypokinesis and scarring of the basalinferior myocardium. Features are  consistent with a pseudonormal left ventricular filling pattern, with concomitant abnormal relaxation and increased filling pressure (grade 2 diastolic dysfunction). - Aortic valve: Trileaflet; mildly calcified leaflets. - Mitral valve: Calcified annulus. There was mild regurgitation. - Left atrium: The atrium was mildly dilated. - Right ventricle: Systolic function was mildly reduced. - Right atrium: Central venous pressure (est): 3 mm Hg. - Atrial septum: No defect or patent foramen ovale was identified. - Tricuspid valve: There was trivial regurgitation. - Pulmonary arteries: PA peak pressure: 30 mm Hg (S). - Pericardium, extracardiac: There was no pericardial effusion.  Impressions:  - Normal LV wall thickness with LVEF approximately 45%. There is hypokinesis and scarring of the basal inferior wall. Grade 2 diastolic dysfunction with increased filling pressure. Mild left atrial enlargement. MAC with mild mitral regurgitation. Mildly sclerotic aortic valve. Mildly reduced RV contraction. Trivial tricuspid regurgitation with PASP 30 mmHg. Assessment and Plan:  1. CAD in native artery   2. Bradycardia   3. Essential hypertension   4. Chronic kidney disease, stage IV (severe) ([place])   5. Iron deficiency anemia, unspecified iron deficiency anemia type   6. Carotid bruit, unspecified laterality   7. Carotid artery disease, unspecified laterality, unspecified type ([place])    1. CAD in native artery Denies any progressive anginal or exertional symptoms.  Continue aspirin 81 mg.  2. Bradycardia Recent episodes of fluctuating heart rate from 30s to 60s at [facility].  Patient states he felt dizzy, weak, fatigued during these episodes.  Please get a 14-day ZIO monitor checking for tachycardic or bradycardia arrhythmias.  3. Essential hypertension Blood pressure is elevated today.  His amlodipine was stopped by his nephrologist.  Start hydralazine 50 mg p.o.  twice daily.  Check blood pressure daily for 2 weeks and come back for a nursing visit.  Bring the logs of blood pressure measurements with you.  We may need to adjust the hydralazine.  4. Chronic kidney disease, stage IV (severe) ([place]) Follows with [facility].  Recent creatinine 2.84, BUN 51, GFR 21  5.  Anemia Patient has pending EGD for IDA, chronic GERD, epigastric pain on aspirin.  Patient tells me he had a recent iron infusion for anemia.  He sees Dr. Alanson Puls  6.  Carotid artery disease, carotid artery bruits. Please  schedule a follow-up carotid artery study.  Patient has bilateral carotid artery bruits.  Had a previous study in 2015. Report is not accessible through epic.  Medication Adjustments/Labs and Tests Ordered: Current medicines are reviewed at length with the patient today.  Concerns regarding medicines are outlined above.   Disposition: Follow-up with Dr. Domenic Polite or APP 1 month  Signed, Levell July, NP 04/28/2020 12:19 PM    [facility] [place], [place],  [address] Phone: [phone]; Fax: [phone]

## 2020-04-29 ENCOUNTER — Encounter: Payer: Self-pay | Admitting: *Deleted

## 2020-04-29 ENCOUNTER — Other Ambulatory Visit: Payer: Self-pay | Admitting: *Deleted

## 2020-04-29 NOTE — Patient Outreach (Signed)
[facility]) Care Management  [place]  04/29/2020   Bradley Black May 17, 1946 [id number]   [place] Quarterly Outreach   Referral Date:  11/10/2019 Referral Source:  Transfer from [place] Reason for Referral:  Continued Disease Management Education Insurance:  [employer]   Outreach Attempt:  Successful telephone outreach to patient for follow up.  HIPAA verified with patient.  Patient reporting he is doing better.  States he has not had any light headiness or dizziness since last Friday at renal office; did not go to the emergency room.  Has attended appointment with cardiology and is awaiting holter monitor to he mailed to him.  Discussed seeking medical attention if low heart rate and symptomatic.  Has been monitoring blood pressures and states his medications were changed.  Hydralazine added yesterday by cardiology.  Blood pressure this morning was 150/60 and patient stating he has started his new medications.  Reviewed with patient providers request to monitor blood pressures at least twice a day and to write them down for review at next appointment.  Does not monitor blood sugars often but when he does ir ranges 120-150's.  Last Hgb A1C was 6 on 05/01/2019.  Encounter Medications:  Outpatient Encounter Medications as of 04/29/2020  Medication Sig  . aspirin EC 81 MG tablet Take 81 mg by mouth daily.  . cinacalcet (SENSIPAR) 30 MG tablet Take 30 mg by mouth daily with breakfast.   . citalopram (CELEXA) 10 MG tablet TAKE ONE (1) TABLET BY MOUTH EVERY DAY  . doxazosin (CARDURA) 8 MG tablet Take 1 tablet (8 mg total) by mouth daily.  . furosemide (LASIX) 20 MG tablet 20 mg. prn  . hydrALAZINE (APRESOLINE) 50 MG tablet Take 1 tablet (50 mg total) by mouth 2 (two) times daily.  . pantoprazole (PROTONIX) 40 MG tablet TAKE ONE TABLET BY MOUTH TWICE A DAY  . pravastatin (PRAVACHOL) 20 MG tablet TAKE ONE (1) TABLET BY MOUTH EVERY  DAY  . sitaGLIPtin (JANUVIA) 50 MG tablet Take 25 mg by mouth daily.  . sodium bicarbonate 650 MG tablet Take 650 mg by mouth 3 (three) times daily.  . sildenafil (VIAGRA) 100 MG tablet Take 0.5-1 tablets (50-100 mg total) by mouth daily as needed for erectile dysfunction.   No facility-administered encounter medications on file as of 04/29/2020.    Functional Status:  In your present state of health, do you have any difficulty performing the following activities: 12/15/2019  Hearing? N  Vision? N  Difficulty concentrating or making decisions? N  Walking or climbing stairs? N  Dressing or bathing? N  Doing errands, shopping? N  Preparing Food and eating ? N  Using the Toilet? N  In the past six months, have you accidently leaked urine? N  Do you have problems with loss of bowel control? N  Managing your Medications? N  Managing your Finances? N  Housekeeping or managing your Housekeeping? N  Some recent data might be hidden    Fall/Depression Screening: Fall Risk  04/29/2020 12/15/2019 05/01/2019  Falls in the past year? 1 1 0  Number falls in past yr: 0 0 -  Comment Fell December slipping on ice fell on ice -  Injury with Fall? 0 0 -  Risk for fall due to : Medication side effect Medication side effect -  Follow up Falls evaluation completed;Education provided;Falls prevention discussed Falls prevention discussed;Education provided;Falls evaluation completed Falls evaluation completed   PHQ 2/9 Scores 05/01/2019  01/27/2018 01/27/2018 01/22/2017 03/30/2014 02/11/2014  PHQ - 2 Score 0 0 0 0 0 0  PHQ- 9 Score - - - 0 - -   Goals Addressed              This Visit's Progress   .  Patient will report maintaining Hgb A1C of 7 or below within the next 90 days (pt-stated)        CARE PLAN ENTRY (see longtitudinal plan of care for additional care plan information)  Objective:  Lab Results  Component Value Date   HGBA1C 6.0 (H) 05/01/2019 .   Lab Results  Component Value Date    CREATININE 2.84 (H) 05/01/2019   CREATININE 2.23 (H) 10/14/2018   CREATININE 2.10 (H) 05/06/2018 .   [name] No results found for: EGFR  Current Barriers:  [name] Knowledge Deficits related to basic Diabetes pathophysiology and self care/management  Case Manager Clinical Goal(s):  Over the next 90 days, patient will demonstrate improved adherence to prescribed treatment plan for diabetes self care/management as evidenced by: maintaining Hgb A1C of below 7 . Verbalize adherence to ADA/ carb modified diet within the next 90 days . Verbalize exercise 3 days/week . Verbalize adherence to prescribed medication regimen within the next 90 days   Interventions:  . Provided education to patient about basic DM disease process . Reviewed medications with patient and discussed importance of medication adherence . Discussed plans with patient for ongoing care management follow up and provided patient with direct contact information for care management team  Patient Self Care Activities:  . Self administers oral medications as prescribed . Attends all scheduled provider appointments . Checks blood sugars as prescribed and utilize hyper and hypoglycemia protocol as needed . Adheres to prescribed ADA/carb modified  Initial goal documentation  CARE PLAN ENTRY (see longtitudinal plan of care for additional care plan information)  Objective:  . Last practice recorded BP readings:  BP Readings from Last 3 Encounters:  04/28/20 (!) 160/78  04/13/20 (!) 151/73  03/11/20 (!) 142/70 .   [name] Most recent eGFR/CrCl: No results found for: EGFR  No components found for: CRCL  Current Barriers:  [name] Knowledge Deficits related to understanding of medications prescribed for management of hypertension . Knowledge deficit related to self care management of hypertension . Knowledge deficit related to dangers of uncontrolled hypertension  Case Manager Clinical Goal(s):  [name] Over the next 30 days, patient will verbalize  understanding of plan for hypertension management . Over the next 30 days, patient will attend all scheduled medical appointments: HTN clinic with Cardiology . Over the next 30 days, patient will demonstrate improved adherence to prescribed treatment plan for hypertension as evidenced by taking all medications as prescribed, monitoring and recording blood pressure as directed, adhering to low sodium/DASH diet . Over the next 30 days, patient will verbalize basic understanding of hypertension disease process and self health management plan as evidenced by better blood pressure control with ranges below or at goal  Interventions:  . Evaluation of current treatment plan related to hypertension self management and patient's adherence to plan as established by provider. . Reviewed medications with patient and discussed importance of compliance . Discussed plans with patient for ongoing care management follow up and provided patient with direct contact information for care management team . Advised patient, providing education and rationale, to monitor blood pressure daily and record, calling PCP for findings outside established parameters.  . Reviewed scheduled/upcoming provider appointments including:  . Provided education regarding s/s  of DASH diet/Low salt diet . Provided education regarding complications of uncontrolled blood pressure . Discussed signs and symptoms of hypertension  Patient Self Care Activities:  . Self administers medications as prescribed . Attends all scheduled provider appointments . Calls provider office for new concerns, questions, or BP outside discussed parameters . Monitors BP and records as discussed . Adheres to a low sodium diet/DASH diet . Increase physical activity as tolerated . Verbalize where to go to receive medical care  Initial goal documentation         Appointments:  Last attended appointment with primary care provider  On 02/11/2019.  States he needs  to schedule his yearly physical appointment and has left message at the office.  Has follow up scheduled with [facility] Clinic and Cardiology on 05/13/2020 and 05/27/2020.  Plan: RN Health Coach will send primary care provider quarterly update. RN Health Coach will make next telephone outreach to patient within the month of August and patient agrees to future outreach.  [place] [phone] [email or web address]

## 2020-05-06 ENCOUNTER — Telehealth: Payer: Self-pay | Admitting: Family Medicine

## 2020-05-06 NOTE — Progress Notes (Signed)
  Chronic Care Management   Outreach Note  05/06/2020 Name: Bradley Black MRN: [id number] DOB: 1946/04/10  Referred by: Susy Frizzle, MD Reason for referral : No chief complaint on file.   An unsuccessful telephone outreach was attempted today. The patient was referred to the pharmacist for assistance with care management and care coordination.   Follow Up Plan:   Carley Perdue [employer] Scheduler

## 2020-05-07 DIAGNOSIS — R001 Bradycardia, unspecified: Secondary | ICD-10-CM | POA: Diagnosis not present

## 2020-05-09 ENCOUNTER — Other Ambulatory Visit: Payer: Self-pay

## 2020-05-09 ENCOUNTER — Ambulatory Visit (INDEPENDENT_AMBULATORY_CARE_PROVIDER_SITE_OTHER): Payer: Medicare Other | Admitting: Family Medicine

## 2020-05-09 ENCOUNTER — Telehealth: Payer: Self-pay | Admitting: Internal Medicine

## 2020-05-09 ENCOUNTER — Ambulatory Visit (INDEPENDENT_AMBULATORY_CARE_PROVIDER_SITE_OTHER): Payer: Medicare Other

## 2020-05-09 ENCOUNTER — Encounter: Payer: Self-pay | Admitting: Family Medicine

## 2020-05-09 VITALS — BP 158/86 | HR 69 | Temp 98.1°F | Ht 68.0 in | Wt 157.0 lb

## 2020-05-09 DIAGNOSIS — J310 Chronic rhinitis: Secondary | ICD-10-CM | POA: Diagnosis not present

## 2020-05-09 DIAGNOSIS — R001 Bradycardia, unspecified: Secondary | ICD-10-CM

## 2020-05-09 DIAGNOSIS — J329 Chronic sinusitis, unspecified: Secondary | ICD-10-CM | POA: Diagnosis not present

## 2020-05-09 MED ORDER — AMOXICILLIN 875 MG PO TABS: 875.0000 mg | ORAL_TABLET | Freq: Two times a day (BID) | ORAL | 0 refills | Status: DC

## 2020-05-09 MED ORDER — FLUTICASONE PROPIONATE 50 MCG/ACT NA SUSP
2.0000 | Freq: Every day | NASAL | 6 refills | Status: DC
Start: 2020-05-09 — End: 2020-06-29

## 2020-05-09 MED ORDER — SILDENAFIL CITRATE 100 MG PO TABS: 50.0000 mg | ORAL_TABLET | Freq: Every day | ORAL | 5 refills | Status: DC | PRN

## 2020-05-09 NOTE — Telephone Encounter (Signed)
See prior note

## 2020-05-09 NOTE — Telephone Encounter (Signed)
Patient returned call. He has been scheduled for TCS/EGD 9/22 at 2:30pm. Patient aware will need covid test prior to procedure. He is going to stop by the office and pick up his samples of Linzess.

## 2020-05-09 NOTE — Progress Notes (Signed)
Subjective:    Patient ID: Bradley Black, male    DOB: 14-Apr-1946, 74 y.o.   MRN: [id number]  HPI Patient believes he has a sinus infection.  Symptoms began approximately 3 days ago.  He reports pain and pressure in his right frontal and right maxillary sinus.  He reports pain and pressure in his right ear.  He reports head congestion.  He denies any rhinorrhea.  He does report some subjective fevers.  He denies any cough.  He denies any sore throat.  He states that he just does not feel well.  He denies any nausea or vomiting or diarrhea.  He denies any loss in his sense of smell or taste. Past Medical History:  Diagnosis Date  . Acute myocardial infarction, History of: 1988   PTCA of OM; PTCA   . Benign essential HTN    right shoulder  . BPH (benign prostatic hyperplasia)   . CAD S/P percutaneous coronary angioplasty    a. PTCA of [place] b. PCI with BMS to LAD in 1997 c. RCA PCI [place] d. s/p CABG in 11/2011 with LIMA-LAD, SVG-PDA, SVG-OM2, and SVG-D1  . Chronic back pain   . CKD (chronic kidney disease), stage III   . Cyst of bursa    R shoulder  . Diabetic retinopathy ([place])   . DM (diabetes mellitus), type 2 with renal complications ([place])   . GERD (gastroesophageal reflux disease)   . Hyperlipidemia LDL goal <70   . Ischemic cardiomyopathy 11/2011   Intra-OP TEE: EF 40-45%, no regional WMA; improved Anterior WM post CABG.  . 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 BACK SURGERY  1970  . CARDIAC CATHETERIZATION  2013  . CORONARY ANGIOPLASTY  1994   OM  . CORONARY ANGIOPLASTY  1997   LAD  . CORONARY ANGIOPLASTY WITH STENT PLACEMENT  1999   RCA  . CORONARY ANGIOPLASTY WITH STENT PLACEMENT  2001   RCA  . CORONARY ARTERY BYPASS GRAFT  12/27/2011   Procedure: CORONARY ARTERY BYPASS GRAFTING (CABG);  Surgeon: Rexene Alberts, MD;  Location: [facility];   Service: Open Heart Surgery;  Laterality: N/A;  Times four. On pump. Using endoscopically harvested right greater saphenous vein and left internal mammary artery.   [name] HERNIA REPAIR    . INCISION AND DRAINAGE OF WOUND  2006   axilla  . INTRAOPERATIVE TRANSESOPHAGEAL ECHOCARDIOGRAM  12/27/2011   Global hypokinesis with EF of 40-45%, improved LAD distribution wall motion.  [name] LEFT HEART CATHETERIZATION WITH CORONARY ANGIOGRAM N/A 12/13/2011   Procedure: LEFT HEART CATHETERIZATION WITH CORONARY ANGIOGRAM;  Surgeon: Leonie Man, MD;  Location: [facility] CATH LAB;  Service: Cardiovascular;  Laterality: N/A;  . LUMBAR FUSION    . MASS EXCISION  10/24/11   R arm  . TRANSESOPHAGEAL ECHOCARDIOGRAM  2013   Current Outpatient Medications on File Prior to Visit  Medication Sig Dispense Refill  . aspirin EC 81 MG tablet Take 81 mg by mouth daily.    . cinacalcet (SENSIPAR) 30 MG tablet Take 30 mg by mouth daily with breakfast.     . citalopram (CELEXA) 10 MG tablet TAKE ONE (1) TABLET BY MOUTH EVERY DAY 90 tablet 3  . doxazosin (CARDURA) 8 MG tablet Take 1 tablet (8 mg total) by mouth daily. [place]  tablet 3  . furosemide (LASIX) 20 MG tablet 20 mg. prn    . hydrALAZINE (APRESOLINE) 50 MG tablet Take 1 tablet (50 mg total) by mouth 2 (two) times daily. 60 tablet 6  . pantoprazole (PROTONIX) 40 MG tablet TAKE ONE TABLET BY MOUTH TWICE A DAY 180 tablet 3  . pravastatin (PRAVACHOL) 20 MG tablet TAKE ONE (1) TABLET BY MOUTH EVERY DAY 90 tablet 3  . sitaGLIPtin (JANUVIA) 50 MG tablet Take 25 mg by mouth daily.    . sodium bicarbonate 650 MG tablet Take 650 mg by mouth 3 (three) times daily.     No current facility-administered medications on file prior to visit.   No Known Allergies Social History   Socioeconomic History  . Marital status: Married    Spouse name: Not on file  . Number of children: Not on file  . Years of education: Not on file  . Highest education level: Not on file  Occupational History  .  Not on file  Tobacco Use  . Smoking status: Former Smoker    Types: Cigarettes    Quit date: 10/01/1986    Years since quitting: 33.6  . Smokeless tobacco: Never Used  . Tobacco comment: quit about 30 yrs ago  Vaping Use  . Vaping Use: Never used  Substance and Sexual Activity  . Alcohol use: No  . Drug use: No  . 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 Not on file  Social History Narrative   Married father of one grandfather 38. Works out at [employer] at [employer] roughly 2-3 days a week. He works on a treadmill. He does note having a hard time getting his heart rate up.   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   He quit smoking in 1988, and does not drink alcohol.   Social Determinants of Health   Financial Resource Strain:   . Difficulty of Paying Living Expenses:   Food Insecurity:   . Worried About Charity fundraiser in the Last Year:   . Arboriculturist in the Last Year:   Transportation Needs:   . Film/video editor (Medical):   [name] Lack of Transportation (Non-Medical):   Physical Activity:   . Days of Exercise per Week:   . Minutes of Exercise per Session:   Stress:   . Feeling of Stress :   Social Connections:   . Frequency of Communication with Friends and Family:   . Frequency of Social Gatherings with Friends and Family:   . Attends Religious Services:   . Active Member of Clubs or Organizations:   . Attends Archivist Meetings:   [name] Marital Status:   Intimate Partner Violence:   . Fear of Current or Ex-Partner:   . Emotionally Abused:   [name] Physically Abused:   . Sexually Abused:       Review of Systems  All other systems reviewed and are negative.      Objective:   Physical Exam Vitals reviewed.  Constitutional:      Appearance: Normal appearance.  HENT:     Right Ear: Tympanic membrane and ear canal normal.     Left Ear: Tympanic  membrane and ear canal normal.     Nose: Congestion present.     Right Turbinates: Enlarged and swollen.     Left Turbinates: Enlarged and swollen.  Right Sinus: Maxillary sinus tenderness and frontal sinus tenderness present.  Cardiovascular:     Rate and Rhythm: Normal rate and regular rhythm.     Heart sounds: Normal heart sounds.  Pulmonary:     Effort: Pulmonary effort is normal. No respiratory distress.     Breath sounds: Normal breath sounds. No stridor. No wheezing, rhonchi or rales.  Neurological:     Mental Status: He is alert.           Assessment & Plan:  Rhinosinusitis - Plan: SARS-COV-2 RNA,(COVID-19) QUAL NAAT  Patient appears to have a sinus infection.  Given the current Covid pandemic I will screen for COVID-19.  Meanwhile I will treat his sinus infection with amoxicillin 875 mg p.o. twice daily for 10 days and Flonase 2 sprays each nostril daily.

## 2020-05-09 NOTE — Addendum Note (Signed)
Addended by: Merlene Laughter on: 05/09/2020 09:14 AM   Modules accepted: Orders

## 2020-05-09 NOTE — Telephone Encounter (Signed)
Pt returning call to [place]. [phone]

## 2020-05-09 NOTE — Telephone Encounter (Signed)
LMOVM for pt to schedule procedures

## 2020-05-10 ENCOUNTER — Telehealth: Payer: Self-pay | Admitting: *Deleted

## 2020-05-10 LAB — SARS-COV-2 RNA,(COVID-19) QUALITATIVE NAAT: SARS CoV2 RNA: NOT DETECTED

## 2020-05-11 ENCOUNTER — Other Ambulatory Visit: Payer: Self-pay | Admitting: Family Medicine

## 2020-05-11 ENCOUNTER — Ambulatory Visit (INDEPENDENT_AMBULATORY_CARE_PROVIDER_SITE_OTHER): Payer: Medicare Other

## 2020-05-11 ENCOUNTER — Telehealth: Payer: Self-pay | Admitting: Family Medicine

## 2020-05-11 ENCOUNTER — Other Ambulatory Visit: Payer: Self-pay

## 2020-05-11 ENCOUNTER — Telehealth: Payer: Self-pay | Admitting: *Deleted

## 2020-05-11 DIAGNOSIS — R0989 Other specified symptoms and signs involving the circulatory and respiratory systems: Secondary | ICD-10-CM

## 2020-05-11 DIAGNOSIS — I779 Disorder of arteries and arterioles, unspecified: Secondary | ICD-10-CM

## 2020-05-11 DIAGNOSIS — I6523 Occlusion and stenosis of bilateral carotid arteries: Secondary | ICD-10-CM

## 2020-05-11 NOTE — Telephone Encounter (Signed)
-----   Message from Verta Ellen., NP sent at 05/11/2020  3:17 PM EDT ----- Please call the patient and let him know the carotid study showed he has some mild plaque in his right carotid artery and medium plaque in his left carotid artery. Tell him we will need to do yearly carotid studies to keep an eye on this. Schedule a follow up study next year around the fist week in August. Thanks

## 2020-05-11 NOTE — Telephone Encounter (Signed)
Pt voiced understanding - order placed for repeat carotid US for next year

## 2020-05-11 NOTE — Progress Notes (Signed)
  Chronic Care Management   Note  05/11/2020 Name: Kendon KODIAK ROLLYSON MRN: [id number] DOB: 1946/02/27  Cresencio L Speyer is a 74 y.o. year old male who is a primary care patient of Pickard, [name], MD. I reached out to [place] by phone today in response to a referral sent by Mr. Timotheus L Dunwoody's PCP, Susy Frizzle, MD.   Mr. Biehn was given information about Chronic Care Management services today including:  1. CCM service includes personalized support from designated clinical staff supervised by his physician, including individualized plan of care and coordination with other care providers 2. 24/7 contact phone numbers for assistance for urgent and routine care needs. 3. Service will only be billed when office clinical staff spend 20 minutes or more in a month to coordinate care. 4. Only one practitioner may furnish and bill the service in a calendar month. 5. The patient may stop CCM services at any time (effective at the end of the month) by phone call to the office staff.   Patient wishes to consider information provided and/or speak with a member of the care team before deciding about enrollment in care management services.   Follow up plan:   Carley Perdue [employer] Scheduler

## 2020-05-12 ENCOUNTER — Encounter: Payer: Self-pay | Admitting: Family Medicine

## 2020-05-12 DIAGNOSIS — I6522 Occlusion and stenosis of left carotid artery: Secondary | ICD-10-CM | POA: Insufficient documentation

## 2020-05-12 NOTE — Telephone Encounter (Signed)
Called [employer] and spoke with Kristen-PA rep. PA is required for EGD but not TCS. PA initiated. Pa is pending review. Ref# [id number]

## 2020-05-13 ENCOUNTER — Ambulatory Visit (INDEPENDENT_AMBULATORY_CARE_PROVIDER_SITE_OTHER): Payer: Medicare Other | Admitting: *Deleted

## 2020-05-13 VITALS — BP 152/68 | HR 54

## 2020-05-13 DIAGNOSIS — I10 Essential (primary) hypertension: Secondary | ICD-10-CM | POA: Diagnosis not present

## 2020-05-13 NOTE — Telephone Encounter (Signed)
PA approved. Auth# [id number] dates 06/22/2020-09/20/2020.

## 2020-05-13 NOTE — Progress Notes (Signed)
Patient came in today for a 2 week BP check after starting Hydralazine 50 mg bid.  BP today is 543014 per April Garrison, [place]. These results were reviewed with Katina Dung, NP.  Hydralazine will be increased to 100 mg every morning and 50 mg every pm per Jonni Sanger.  Patient will return for appointment on 05/27/20 at 10 am  and continue to log home BP's. He was advised to check BP at the same time daily. Patient verbalized understanding of all instructions.

## 2020-05-14 MED ORDER — HYDRALAZINE HCL 50 MG PO TABS
ORAL_TABLET | ORAL | 3 refills | Status: DC
Start: 2020-05-14 — End: 2020-05-27

## 2020-05-14 NOTE — Patient Instructions (Signed)
Patient came in today for a 2 week BP check after starting Hydralazine 50 mg bid.  BP today is 224497 per April Garrison, [place]. These results were reviewed with Katina Dung, NP.  Hydralazine will be increased to 100 mg every morning and 50 mg every pm per Jonni Sanger.  Patient will return for appointment on 05/27/20 at 10 am  and continue to log home BP's. He was advised to check BP at the same time daily. Patient verbalized understanding of all instructions.

## 2020-05-17 ENCOUNTER — Telehealth: Payer: Self-pay | Admitting: *Deleted

## 2020-05-17 ENCOUNTER — Other Ambulatory Visit: Payer: Self-pay | Admitting: *Deleted

## 2020-05-17 NOTE — Telephone Encounter (Signed)
Patient has reached maximum autotrigger recordings on 14 day ZIO AT long term monitor-Live Telemetry.  He has 4 days left of the 14 day period.  Irhythm has been instructed to overnight replacement monitor due to frequent ventricular ectopy.

## 2020-05-17 NOTE — Patient Outreach (Signed)
[facility]) Care Management  05/17/2020  Bradley Black [birth year] [id number]   RN Health Coach Monthly Outreach  Referral Date: 11/10/2019 Referral Source: Transfer from [place] Reason for Referral: Continued Disease Management Education Insurance: [employer]   Outreach Attempt:  Outreach attempt #1 to patient for follow up. No answer. RN Health Coach left HIPAA compliant voicemail message along with contact information.  Plan:  RN Health Coach will make another outreach attempt within the month of September if no return call back from patient.  [place] [phone] [email or web address]

## 2020-05-27 ENCOUNTER — Other Ambulatory Visit: Payer: Self-pay

## 2020-05-27 ENCOUNTER — Encounter: Payer: Self-pay | Admitting: Family Medicine

## 2020-05-27 ENCOUNTER — Ambulatory Visit (INDEPENDENT_AMBULATORY_CARE_PROVIDER_SITE_OTHER): Payer: Medicare Other | Admitting: Family Medicine

## 2020-05-27 VITALS — BP 150/82 | HR 60 | Ht 68.0 in | Wt 159.4 lb

## 2020-05-27 DIAGNOSIS — N184 Chronic kidney disease, stage 4 (severe): Secondary | ICD-10-CM

## 2020-05-27 DIAGNOSIS — I779 Disorder of arteries and arterioles, unspecified: Secondary | ICD-10-CM

## 2020-05-27 DIAGNOSIS — R001 Bradycardia, unspecified: Secondary | ICD-10-CM

## 2020-05-27 DIAGNOSIS — I10 Essential (primary) hypertension: Secondary | ICD-10-CM | POA: Diagnosis not present

## 2020-05-27 DIAGNOSIS — I2510 Atherosclerotic heart disease of native coronary artery without angina pectoris: Secondary | ICD-10-CM

## 2020-05-27 MED ORDER — CLONIDINE HCL 0.1 MG PO TABS: 0.1000 mg | ORAL_TABLET | Freq: Two times a day (BID) | ORAL | 6 refills | Status: DC

## 2020-05-27 NOTE — Addendum Note (Signed)
Addended by: Laurine Blazer on: 05/27/2020 10:54 AM   Modules accepted: Orders

## 2020-05-27 NOTE — Patient Instructions (Addendum)
Medication Instructions:   Stop Hydralazine.   Begin Clonidine 0.1mg  twice a day.  Continue all other medications.    Labwork: none  Testing/Procedures: none  Follow-Up: 6 months  Any Other Special Instructions Will Be Listed Below (If Applicable). Your physician has requested that you regularly monitor and record your blood pressure readings at home over the next several weeks.  Please use the same machine at the same time of day to check your readings and record them to notify office if blood pressure staying above 130/80.    If you need a refill on your cardiac medications before your next appointment, please call your pharmacy.

## 2020-05-27 NOTE — Progress Notes (Signed)
Cardiology Office Note  Date: 05/27/2020   ID: Bradley Black, [name] 10-Jan-1946, MRN [id number]  PCP:  Susy Frizzle, MD  Cardiologist:  No primary care provider on file. Electrophysiologist:  None   Chief Complaint: Follow-up CAD  History of Present Illness: Bradley Black is a 74 y.o. 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  (Blockage in left neck artery has worsened. Not yet at the point he needs surgery but we need to start watching it more closely. Recheck in 6 months)  Last encounter with Dr. Bronson Ing 12/07/2019 via telemedicine.  He was having more feet swelling.  He was trying to avoid salt.  He denied any chest pain, shortness of breath, dizziness, palpitations.  Amlodipine was increased due to increased blood pressure.  Statin therapy was continued.  He was following with nephrology for chronic kidney disease recent creatinine was 2.3.  He was asked to speak with his nephrologist regarding diuretic therapy.  He stated it might help to take at least 2 days of Lasix.  Not on AV nodal blocking agents due to bradycardia.  At last visit he was status post recent episode of heart rate in the 30s to 60s during visit to [facility].  They requested the patient be evaluated by cardiology.  Patient stated during this episode he was feeling significantly tired and dizzy with fatigue.  He has a history of bradycardia.  Recent history of anemia and is seeing Dr. [facility].  Stated he received an iron infusion recently.  He has a pending colonoscopy which has not been scheduled as of yet.  He denies any frank blood or dark tarry  stools tools.  He denied any palpitations or arrhythmias, CVA or TIA-like symptoms, PND, orthopnea, syncopal episodes.  Denied any claudication-like symptoms, DVT or PE-like symptoms, lower extremity edema.  Denies any anginal or exertional symptoms.  He stated his nephrologist stopped the amlodipine which was increased at last visit with Dr. Bronson Ing.  Patient is here for follow-up status post recent carotid artery study and cardiac event monitor.  Carotid studies remain unchanged.  He continues to have moderate LICA stenosis of 40 to 59% and mild R ICA stenosis 1 to 39%.  He states the new blood pressure medication does not seem to be working and he just does not feel well on the medication.  States has been having some trouble sleeping also.  He denies any more episodes of slow heart rate or dizziness.  Denies any palpitations or arrhythmias or racing heart.  Denies any orthostatic symptoms, PND, orthopnea, lower extremity edema, claudication or DVT/PE-like symptoms.  No CVA or TIA-like symptoms.  Event monitor results and interpretation are pending.  Patient just removed the monitor 3 days ago.   Past Medical History:  Diagnosis Date  . Acute myocardial infarction, History of: 1988   PTCA of OM; PTCA   . Benign essential HTN    right shoulder  . BPH (benign prostatic hyperplasia)   . CAD S/P percutaneous coronary angioplasty    a. PTCA of [place] b. PCI with BMS to LAD in 1997 c. RCA PCI [place] d. s/p CABG in 11/2011 with LIMA-LAD,  SVG-PDA, SVG-OM2, and SVG-D1  . Chronic back pain   . CKD (chronic kidney disease), stage III   . Cyst of bursa    R shoulder  . Diabetic retinopathy ([place])   . DM (diabetes mellitus), type 2 with renal complications ([place])   . GERD (gastroesophageal reflux disease)   . Hyperlipidemia LDL goal <70   . Ischemic cardiomyopathy 11/2011   Intra-OP TEE: EF 40-45%, no regional WMA; improved Anterior WM post CABG.  . Left carotid artery stenosis   .  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 BACK SURGERY  1970  . CARDIAC CATHETERIZATION  2013  . CORONARY ANGIOPLASTY  1994   OM  . CORONARY ANGIOPLASTY  1997   LAD  . CORONARY ANGIOPLASTY WITH STENT PLACEMENT  1999   RCA  . CORONARY ANGIOPLASTY WITH STENT PLACEMENT  2001   RCA  . CORONARY ARTERY BYPASS GRAFT  12/27/2011   Procedure: CORONARY ARTERY BYPASS GRAFTING (CABG);  Surgeon: Rexene Alberts, MD;  Location: [facility];  Service: Open Heart Surgery;  Laterality: N/A;  Times four. On pump. Using endoscopically harvested right greater saphenous vein and left internal mammary artery.   [name] HERNIA REPAIR    . INCISION AND DRAINAGE OF WOUND  2006   [place]  . INTRAOPERATIVE TRANSESOPHAGEAL ECHOCARDIOGRAM  12/27/2011   Global hypokinesis with EF of 40-45%, improved LAD distribution wall motion.  [name] LEFT HEART CATHETERIZATION WITH CORONARY ANGIOGRAM N/A 12/13/2011   Procedure: LEFT HEART CATHETERIZATION WITH CORONARY ANGIOGRAM;  Surgeon: Leonie Man, MD;  Location[facility] CATH LAB;  Service: Cardiovascular;  Laterality: N/A;  . LUMBAR FUSION    . MASS EXCISION  10/24/11   R arm  . TRANSESOPHAGEAL ECHOCARDIOGRAM  2013    Current Outpatient Medications  Medication Sig Dispense Refill  . aspirin EC 81 MG tablet Take 81 mg by mouth daily.    . cinacalcet (SENSIPAR) 30 MG tablet Take 30 mg by mouth daily with breakfast.     . citalopram (CELEXA) 10 MG tablet TAKE ONE (1) TABLET BY MOUTH EVERY DAY 90 tablet 3  . doxazosin (CARDURA) 8 MG tablet Take 1 tablet (8 mg total) by mouth daily. 90 tablet 3  . fluticasone (FLONASE) 50 MCG/ACT nasal spray Place 2 sprays into both nostrils daily. 16 g 6  . furosemide (LASIX) 20 MG tablet 20 mg. prn    . hydrALAZINE (APRESOLINE) 50 MG tablet Take 2 tablet by mouth every morning and 1 tablet by mouth every night 270 tablet 3  . pantoprazole  (PROTONIX) 40 MG tablet TAKE ONE TABLET BY MOUTH TWICE A DAY 180 tablet 3  . pravastatin (PRAVACHOL) 20 MG tablet TAKE ONE (1) TABLET BY MOUTH EVERY DAY 90 tablet 3  . sildenafil (VIAGRA) 100 MG tablet Take 0.5-1 tablets (50-100 mg total) by mouth daily as needed for erectile dysfunction. 60 tablet 5  . sitaGLIPtin (JANUVIA) 50 MG tablet Take 25 mg by mouth daily.    . sodium bicarbonate 650 MG tablet Take 650 mg by mouth 3 (three) times daily.     No current facility-administered medications for this visit.   Allergies:  Patient has no known allergies.   Social History: The patient  reports that he quit smoking about 33 years ago. His smoking use included cigarettes. He has never used smokeless  tobacco. He reports that he does not drink alcohol and does not use drugs.   Family History: The patient's family history includes Cancer in his father and mother.   ROS:  Please see the history of present illness. Otherwise, complete review of systems is positive for none.  All other systems are reviewed and negative.   Physical Exam: VS:  BP (!) 150/82   Pulse 60   Ht _0  (1.727 m)   Wt 159 lb 6.4 oz (72.3 kg)   SpO2 97%   BMI 24.24 kg/m , BMI Body mass index is 24.24 kg/m.  Wt Readings from Last 3 Encounters:  05/27/20 159 lb 6.4 oz (72.3 kg)  05/09/20 157 lb (71.2 kg)  04/28/20 156 lb 12.8 oz (71.1 kg)    General: Patient appears comfortable at rest. Neck: Supple, no elevated JVP, positive for bilateral  carotid bruits, no thyromegaly. Lungs: Clear to auscultation, nonlabored breathing at rest. Cardiac: Regular rate and rhythm, no S3 or significant systolic murmur, no pericardial rub. Extremities: No pitting edema, distal pulses 2+. Skin: Warm and dry. Musculoskeletal: No kyphosis. Neuropsychiatric: Alert and oriented x3, affect grossly appropriate.  ECG:  An ECG dated 04/28/2020 was personally reviewed today and demonstrated:  Sinus rhythm rate of 66, frequent ectopic ventricular  beats, PACs x2, LAE, old inferior infarct, old anterior infarct, nonspecific T wave abnormality.  Recent Labwork: No results found for requested labs within last 8760 hours.     Component Value Date/Time   CHOL 126 05/01/2019 0945   TRIG 75 05/01/2019 0945   HDL 37 (L) 05/01/2019 0945   CHOLHDL 3.4 05/01/2019 0945   VLDL 13 01/22/2017 1007   LDLCALC 73 05/01/2019 0945    Other Studies Reviewed Today:  Carotid artery duplex 05/11/2020 Right Carotid: Velocities in the right ICA are consistent with a 1-39% stenosis. The ECA appears >50% stenosed. Left Carotid: Velocities in the left ICA are consistent with a 40-59% stenosis. The ECA appears >50% stenosed. Vertebrals: Bilateral vertebral arteries demonstrate antegrade flow. Subclavians: Normal flow hemodynamics were seen in bilateral subclavian arteries.  Cardiac Event Monitor: 10/2015  Predominantly sinus rhythm with PVC, asymptomatic.  Isolated episode of sinus tachycardia, HR 120 bpm.  Echocardiogram: 11/2015 Study Conclusions  - Left ventricle: The cavity size was normal. Wall thickness was normal. The estimated ejection fraction was 45%. There is hypokinesis and scarring of the basalinferior myocardium. Features are consistent with a pseudonormal left ventricular filling pattern, with concomitant abnormal relaxation and increased filling pressure (grade 2 diastolic dysfunction). - Aortic valve: Trileaflet; mildly calcified leaflets. - Mitral valve: Calcified annulus. There was mild regurgitation. - Left atrium: The atrium was mildly dilated. - Right ventricle: Systolic function was mildly reduced. - Right atrium: Central venous pressure (est): 3 mm Hg. - Atrial septum: No defect or patent foramen ovale was identified. - Tricuspid valve: There was trivial regurgitation. - Pulmonary arteries: PA peak pressure: 30 mm Hg (S). - Pericardium, extracardiac: There was no pericardial effusion.  Impressions:  -  Normal LV wall thickness with LVEF approximately 45%. There is hypokinesis and scarring of the basal inferior wall. Grade 2 diastolic dysfunction with increased filling pressure. Mild left atrial enlargement. MAC with mild mitral regurgitation. Mildly sclerotic aortic valve. Mildly reduced RV contraction. Trivial tricuspid regurgitation with PASP 30 mmHg. Assessment and Plan:   1. CAD in native artery Denies any progressive anginal or exertional symptoms.  Continue aspirin 81 mg.  2. Bradycardia Recent episodes of fluctuating heart rate from 30s to  61s at [facility].  Patient states he felt dizzy, weak, fatigued during these episodes.  Patient just finished his ZIO monitor 3 days ago.  Results are pending.  Patient denies any further sensation of slow heart rates, palpitations, or arrhythmias during the monitoring period.  3. Essential hypertension Blood pressure continues to be elevated today at 150/82.  He states he is not tolerating the hydralazine well and feels bad since he has been on it.  He states it does not seem to be very effective due to continuing elevated blood pressures.  His amlodipine was stopped by his nephrologist previously.  Discontinue hydralazine.  Start clonidine 0.1 mg p.o. twice daily.  Continue to monitor your blood pressures.  The goal is 130/80 or less.  Call if sustained blood pressures greater than this number.  We may need to adjust the clonidine in the future.  4. Chronic kidney disease, stage IV (severe) ([place]) Follows with [facility].  Recent creatinine 2.84, BUN 51, GFR 21  5.  Anemia Patient has pending EGD for IDA, chronic GERD, epigastric pain on aspirin.  Patient tells me he had a recent iron infusion for anemia.  He sees Dr. Alanson Puls  6.  Carotid artery disease, carotid artery bruits.   Patient has bilateral carotid artery bruits. Had a previous study in 2015.  Carotid artery duplex on 05/11/2020 showed R  ICA 1 to 39%.  The ECA appears greater than 50% stenosis. L ICA 40 to 59% stenosis, ECA appears greater than 50% stenosis.  Bilateral vertebral arteries demonstrate antegrade flow.  Medication Adjustments/Labs and Tests Ordered: Current medicines are reviewed at length with the patient today.  Concerns regarding medicines are outlined above.   Disposition: Follow-up with Dr. Domenic Polite or APP 6 months  Signed, Levell July, NP 05/27/2020 10:41 AM    [facility] at [address], [place], [place] [address] Phone: [phone]; Fax: [phone]

## 2020-05-30 DIAGNOSIS — R809 Proteinuria, unspecified: Secondary | ICD-10-CM | POA: Diagnosis not present

## 2020-05-30 DIAGNOSIS — N170 Acute kidney failure with tubular necrosis: Secondary | ICD-10-CM | POA: Diagnosis not present

## 2020-05-30 DIAGNOSIS — E1129 Type 2 diabetes mellitus with other diabetic kidney complication: Secondary | ICD-10-CM | POA: Diagnosis not present

## 2020-05-30 DIAGNOSIS — N189 Chronic kidney disease, unspecified: Secondary | ICD-10-CM | POA: Diagnosis not present

## 2020-05-30 DIAGNOSIS — E1122 Type 2 diabetes mellitus with diabetic chronic kidney disease: Secondary | ICD-10-CM | POA: Diagnosis not present

## 2020-06-03 DIAGNOSIS — E875 Hyperkalemia: Secondary | ICD-10-CM | POA: Diagnosis not present

## 2020-06-03 DIAGNOSIS — E211 Secondary hyperparathyroidism, not elsewhere classified: Secondary | ICD-10-CM | POA: Diagnosis not present

## 2020-06-03 DIAGNOSIS — D638 Anemia in other chronic diseases classified elsewhere: Secondary | ICD-10-CM | POA: Diagnosis not present

## 2020-06-03 DIAGNOSIS — R809 Proteinuria, unspecified: Secondary | ICD-10-CM | POA: Diagnosis not present

## 2020-06-03 DIAGNOSIS — N189 Chronic kidney disease, unspecified: Secondary | ICD-10-CM | POA: Diagnosis not present

## 2020-06-14 ENCOUNTER — Telehealth: Payer: Self-pay | Admitting: *Deleted

## 2020-06-14 DIAGNOSIS — I493 Ventricular premature depolarization: Secondary | ICD-10-CM

## 2020-06-14 MED ORDER — DILTIAZEM HCL 30 MG PO TABS
30.0000 mg | ORAL_TABLET | Freq: Two times a day (BID) | ORAL | 1 refills | Status: DC
Start: 2020-06-14 — End: 2020-06-29

## 2020-06-14 NOTE — Telephone Encounter (Signed)
Pt requesting monitor results - c/o SOB

## 2020-06-14 NOTE — Telephone Encounter (Signed)
I sent a separate message to you regarding the results.  Thanks

## 2020-06-14 NOTE — Telephone Encounter (Signed)
Pt voiced understanding - Medication sent to pharmacy - referral placed and message sent to schedulers     Patient recently had a ZIO monitor placed for bradycardia. He was enrolled for 6 days and 2 hours starting 05/19/2020 and 5:58 PM until 05/25/2020 at 7:48 PM. His overall heart rate showed a maximum of 150, and minimum heart rate of 47, average heart rate of 66 bpm. The predominant underlying rhythm was normal sinus rhythm. He had 266 ventricular tachycardia runs that occurred during the monitoring. The run with the fastest interval lasted 5 beats with a maximum of 150. The longest lasting was 6 beats with an average rate of 108. Isolated ventricular ectopic beats were 20.8%. PVCs were approximately 21% of total beats monitored during the time.    Please start the patient on Cardizem 30 mg p.o. twice daily for frequent PVCs. Refer him to EP for evaluation of frequent PVCs on recent ZIO monitor. Thank you

## 2020-06-15 ENCOUNTER — Telehealth: Payer: Self-pay | Admitting: *Deleted

## 2020-06-15 ENCOUNTER — Telehealth: Payer: Self-pay

## 2020-06-15 ENCOUNTER — Telehealth: Payer: Self-pay | Admitting: Internal Medicine

## 2020-06-15 NOTE — Telephone Encounter (Signed)
Routing message 

## 2020-06-15 NOTE — Telephone Encounter (Addendum)
Called pt and made him aware of recommendations and he will come by tomorrow morning to pick up Linzess samples.  He was made aware to start them tomorrow per Neil Crouch, PA.  Pt was informed that he could go by [facility] to see if they have tap water enemas.  Explained in detail how pt administers tap water enema.  Pt voiced understanding.  Sent message to cardiologist about clearance to proceed with EGD/TCS.

## 2020-06-15 NOTE — Telephone Encounter (Signed)
Spoke with pt. See other note.  

## 2020-06-15 NOTE — Telephone Encounter (Signed)
Our PA, Neil Crouch would like to see if pt is OK TO COMPLETE EGD/COLONOSCOPY AS SCHEDULED ON 06/22/20.    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

## 2020-06-15 NOTE — Telephone Encounter (Signed)
Patient returned call, please call back at [phone]

## 2020-06-15 NOTE — Telephone Encounter (Signed)
LMOVM for pt. Encounter form had he was given samples at [facility].  Enema is just tap water.

## 2020-06-15 NOTE — Telephone Encounter (Signed)
Routing to [employer] [employer]

## 2020-06-15 NOTE — Telephone Encounter (Signed)
When patient was called to schedule colonoscopy, he was advised to come to office to pick up Linzess 242mcg samples. See telephone note 04/14/20.   He is supposed to start Linzess 211mcg daily tomorrow for FIVE days (can skip a day if gets diarrhea).  Please instruct patient on how to do a tap water enema.   ALSO APPEARS HE IS UNDERGOING CARDIAC MONITORING FOR BRADYCARDIA AND FOUND TO HAVE FREQUENT PVCS.   WILL ASK CARDIOLOGY IF HE IS OK TO COMPLETE EGD/COLONOSCOPY AS SCHEDULED ON 06/22/20

## 2020-06-15 NOTE — Telephone Encounter (Signed)
Pt called to discuss his colonoscopy instructions. On the sheet it says to take Linzess daily, you may hold if you have diarrhea. Does pt need a sample or prescription for his procedure? The pharmacist also told pt he didn't know what a tap water enema is. Do you have any recommendations for enemas?

## 2020-06-16 NOTE — Telephone Encounter (Signed)
Routing recent response in Epic to [employer], [place].    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 Has appointment with Dr. Rayann Heman 06/29/20 for further evaluation. Recommended holding procedure until office visit.   I will route this recommendation to the requesting party via Epic fax function and remove from pre-op pool.  Please call with questions.  [place], [place] 06/16/2020, 1:23 PM

## 2020-06-16 NOTE — Telephone Encounter (Signed)
Dr. Gala Romney, do you advise holding off on colonoscopy? He is conscious sedation patient.

## 2020-06-16 NOTE — Telephone Encounter (Signed)
As discussed, lets hold off until after EP evaluation

## 2020-06-16 NOTE — Telephone Encounter (Signed)
Please let pt know that colonoscopy/EGD will need to be cancelled until after he has been evaluated by Dr. Rayann Heman. He will need to have cardiac clearance before we can reschedule his colonoscopy.

## 2020-06-16 NOTE — Telephone Encounter (Signed)
   [facility] Pre-operative Risk Assessment    HEARTCARE STAFF: - Please ensure there is not already an duplicate clearance open for this procedure. - Under Visit Info/Reason for Call, type in Other and utilize the format Clearance MM/DD/YY or Clearance TBD. Do not use dashes or single digits. - If request is for dental extraction, please clarify the # of teeth to be extracted.  Request for surgical clearance:  1. What type of surgery is being performed? colonoscopy  2. When is this surgery scheduled? 06/22/2020  3. What type of clearance is required (medical clearance vs. Pharmacy clearance to hold med vs. Both)? Medical  4. Are there any medications that need to be held prior to surgery and how long?  5. Practice name and name of physician performing surgery? [facility] / Dr. Sydell Axon  6. What is the office phone number? [phone]   7.   What is the office fax number? [phone]  8.   Anesthesia type (None, local, MAC, general) ?    Marlou Sa 06/16/2020, 10:18 AM  _________________________________________________________________   (provider comments below)

## 2020-06-16 NOTE — Telephone Encounter (Signed)
Called cardiology office and spoke to [place].  She is aware and someone will be back in touch with [place].

## 2020-06-16 NOTE — Telephone Encounter (Signed)
Called pt and informed him that colonoscopy/EGD will need to be cancelled until after he has been evaluated by Dr. Rayann Heman. He was informed that he will need to have cardiac clearance before we can reschedule his colonoscopy.  Cancelled off scheduled and informed Endo.  Routing to [employer] as FYI.

## 2020-06-16 NOTE — Telephone Encounter (Signed)
Contradicting information from cardiology. I sent message to the provider who saw him last, Levell July NP See his response below.    Verta Ellen., NP  Mahala Menghini, PA-C Leda Min, He should be ok. I started him on Cardizem until he can see the electrophysiologist. He's having a fair amount of PVC's. If you guys see anything on the monitor that concerns you during the procedure please let me know. Thanks.       Previous Messages   ----- Message -----  From: West[place] Pam  Sent: 06/15/2020 12:29 PM EDT  To: Westly Pam, Verta Ellen., NP   Good afternoon! We have patient scheduled for EGD and colonoscopy on 06/22/20. I just saw the notes about his cardiac monitoring for bradycardia. Is he okay to proceed with procedures as scheduled?    Magda Paganini

## 2020-06-16 NOTE — Telephone Encounter (Signed)
noted 

## 2020-06-16 NOTE — Telephone Encounter (Signed)
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 Has appointment with Dr. Rayann Heman 06/29/20 for further evaluation. Recommended holding procedure until office visit.   I will route this recommendation to the requesting party via Epic fax function and remove from pre-op pool.  Please call with questions.  [place], [place] 06/16/2020, 1:23 PM

## 2020-06-17 ENCOUNTER — Other Ambulatory Visit: Payer: Self-pay | Admitting: *Deleted

## 2020-06-17 NOTE — Patient Outreach (Signed)
[facility]) Care Management  06/17/2020  Bradley Black 1946/08/14 [id number]   RN Health Coach Monthly Outreach  Referral Date: 11/10/2019 Referral Source: Transfer from [facility] Reason for Referral: Continued Disease Management Education Insurance: [employer]   Outreach Attempt:  Outreach attempt #2 to patient for follow up. No answer. RN Health Coach left HIPAA compliant voicemail message along with contact information.  Plan:  RN Health Coach will make another outreach attempt within the month of October if no return call back from patient.   [place] [phone] [name]

## 2020-06-21 ENCOUNTER — Other Ambulatory Visit (HOSPITAL_COMMUNITY): Payer: Medicare Other

## 2020-06-22 ENCOUNTER — Ambulatory Visit (HOSPITAL_COMMUNITY): Admit: 2020-06-22 | Payer: Medicare Other | Admitting: Internal Medicine

## 2020-06-22 ENCOUNTER — Encounter (HOSPITAL_COMMUNITY): Payer: Self-pay

## 2020-06-22 SURGERY — COLONOSCOPY
Anesthesia: Moderate Sedation

## 2020-06-29 ENCOUNTER — Other Ambulatory Visit: Payer: Self-pay

## 2020-06-29 ENCOUNTER — Ambulatory Visit: Payer: Medicare Other | Admitting: Internal Medicine

## 2020-06-29 VITALS — BP 160/66 | HR 62 | Ht 68.0 in | Wt 158.0 lb

## 2020-06-29 DIAGNOSIS — I493 Ventricular premature depolarization: Secondary | ICD-10-CM

## 2020-06-29 DIAGNOSIS — I2510 Atherosclerotic heart disease of native coronary artery without angina pectoris: Secondary | ICD-10-CM

## 2020-06-29 DIAGNOSIS — N184 Chronic kidney disease, stage 4 (severe): Secondary | ICD-10-CM | POA: Diagnosis not present

## 2020-06-29 MED ORDER — METOPROLOL TARTRATE 25 MG PO TABS
25.0000 mg | ORAL_TABLET | Freq: Two times a day (BID) | ORAL | 3 refills | Status: DC
Start: 2020-06-29 — End: 2020-07-13

## 2020-06-29 NOTE — Progress Notes (Signed)
Electrophysiology Office Note   Date:  06/29/2020   ID:  Bradley Black, Bradley Black 13-Sep-1946, MRN [id number]  PCP:  Susy Frizzle, MD  Cardiologist:  Previously Dr Bronson Ing Primary Electrophysiologist: Thompson Grayer, MD    CC: palpitations   History of Present Illness: Bradley Black is a 74 y.o. male who presents today for electrophysiology evaluation.   He is referred by Katina Dung for EP consultation regarding PVCs. 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 He has rare palpitations and fatigue.  When PVCs are more prominent, he feels washed out.  Today, he denies symptoms of chest pain, shortness of breath, orthopnea, PND,   dizziness, presyncope, syncope, bleeding, or neurologic sequela. + rare edema.  The patient is tolerating medications without difficulties and is otherwise without complaint today.    Past Medical History:  Diagnosis Date  . Acute myocardial infarction, History of: 1988   PTCA of OM; PTCA   . Benign essential HTN    right shoulder  . BPH (benign prostatic hyperplasia)   . CAD S/P percutaneous coronary angioplasty    a. PTCA of [place] b. PCI with BMS to LAD in 1997 c. RCA PCI [place] d. s/p CABG in 11/2011 with LIMA-LAD, SVG-PDA, SVG-OM2, and SVG-D1  . Chronic back pain   . CKD (chronic kidney disease), stage III   . Cyst of bursa    R shoulder  . Diabetic retinopathy ([place])   . DM (diabetes mellitus), type 2 with renal complications ([place])   . GERD (gastroesophageal reflux disease)   . Hyperlipidemia LDL goal <70   . Ischemic cardiomyopathy 11/2011   Intra-OP TEE: EF 40-45%, no regional WMA; improved Anterior WM post CABG.  . Left carotid artery stenosis   . 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 BACK SURGERY  1970  . CARDIAC CATHETERIZATION  2013   . CORONARY ANGIOPLASTY  1994   OM  . CORONARY ANGIOPLASTY  1997   LAD  . CORONARY ANGIOPLASTY WITH STENT PLACEMENT  1999   RCA  . CORONARY ANGIOPLASTY WITH STENT PLACEMENT  2001   RCA  . CORONARY ARTERY BYPASS GRAFT  12/27/2011   Procedure: CORONARY ARTERY BYPASS GRAFTING (CABG);  Surgeon: Rexene Alberts, MD;  Location: [facility];  Service: Open Heart Surgery;  Laterality: N/A;  Times four. On pump. Using endoscopically harvested right greater saphenous vein and left internal mammary artery.   [name] HERNIA REPAIR    . INCISION AND DRAINAGE OF WOUND  2006   [place]  . INTRAOPERATIVE TRANSESOPHAGEAL ECHOCARDIOGRAM  12/27/2011   Global hypokinesis with EF of 40-45%, improved LAD distribution wall motion.  [name] LEFT HEART CATHETERIZATION WITH CORONARY ANGIOGRAM N/A 12/13/2011   Procedure: LEFT HEART CATHETERIZATION WITH CORONARY ANGIOGRAM;  Surgeon: Leonie Man, MD;  Location[facility];  Service: Cardiovascular;  Laterality: N/A;  . LUMBAR FUSION    . MASS EXCISION  10/24/11   R arm  . TRANSESOPHAGEAL ECHOCARDIOGRAM  2013     Current Outpatient Medications  Medication Sig Dispense Refill  . acetaminophen (TYLENOL) 500 MG tablet Take 1,000 mg by mouth every 6 (six) hours as needed for moderate pain or headache.    [name] aspirin EC 81  MG tablet Take 81 mg by mouth daily.    . calcitRIOL (ROCALTROL) 0.25 MCG capsule Take 0.25 mcg by mouth every Monday, Wednesday, and Friday.     . cinacalcet (SENSIPAR) 30 MG tablet Take 30 mg by mouth daily with breakfast.     . citalopram (CELEXA) 10 MG tablet TAKE ONE (1) TABLET BY MOUTH EVERY DAY 90 tablet 3  . diltiazem (CARDIZEM) 30 MG tablet Take 1 tablet (30 mg total) by mouth 2 (two) times daily. 180 tablet 1  . doxazosin (CARDURA) 8 MG tablet Take 1 tablet (8 mg total) by mouth daily. 90 tablet 3  . furosemide (LASIX) 20 MG tablet Take 20 mg by mouth daily as needed for edema.     . hydrALAZINE (APRESOLINE) 50 MG tablet Take 50 mg by mouth 3 (three) times  daily. 2 tablets in the morning and 1 tablet at night    . olmesartan (BENICAR) 5 MG tablet Take 5 mg by mouth daily.    . pantoprazole (PROTONIX) 40 MG tablet TAKE ONE TABLET BY MOUTH TWICE A DAY 180 tablet 3  . pravastatin (PRAVACHOL) 20 MG tablet TAKE ONE (1) TABLET BY MOUTH EVERY DAY 90 tablet 3  . sildenafil (VIAGRA) 100 MG tablet Take 0.5-1 tablets (50-100 mg total) by mouth daily as needed for erectile dysfunction. 60 tablet 5  . sitaGLIPtin (JANUVIA) 50 MG tablet Take 25 mg by mouth daily.    . sodium bicarbonate 650 MG tablet Take 1,950 mg by mouth daily.      No current facility-administered medications for this visit.    Allergies:   Patient has no known allergies.   Social History:  The patient  reports that he quit smoking about 33 years ago. His smoking use included cigarettes. He has never used smokeless tobacco. He reports that he does not drink alcohol and does not use drugs.   Family History:  The patient's family history includes Cancer in his father and mother.    ROS:  Please see the history of present illness.   All other systems are personally reviewed and negative.    PHYSICAL EXAM: VS:  BP (!) 160/66   Pulse 62   Ht 5\' 8"  (1.727 m)   Wt 158 lb (71.7 kg)   SpO2 97%   BMI 24.02 kg/m  , BMI Body mass index is 24.02 kg/m. GEN: Well nourished, well developed, in no acute distress HEENT: normal Neck: no JVD  Cardiac: RRR with ectopy Respiratory:   normal work of breathing GI: soft  MS: no deformity or atrophy Skin: warm and dry  Neuro:  Strength and sensation are intact Psych: euthymic mood, full affect  EKG:  EKG is ordered today. The ekg ordered today is personally reviewed and shows sinus rhythm, left posterior hemiblock   Recent Labs: No results found for requested labs within last 8760 hours.  personally reviewed   Lipid Panel     Component Value Date/Time   CHOL 126 05/01/2019 0945   TRIG 75 05/01/2019 0945   HDL 37 (L) 05/01/2019 0945    CHOLHDL 3.4 05/01/2019 0945   VLDL 13 01/22/2017 1007   LDLCALC 73 05/01/2019 0945   personally reviewed   Wt Readings from Last 3 Encounters:  06/29/20 158 lb (71.7 kg)  05/27/20 159 lb 6.4 oz (72.3 kg)  05/09/20 157 lb (71.2 kg)      Other studies personally reviewed: Additional studies/ records that were reviewed today include: recent event monitor, prior echo  Review of the above records today demonstrates: as above   ASSESSMENT AND PLAN:  1.  PVCs Recent event monitor reveals PVC burden of 17% He is symptomatic Most likely too infrequent for ablation AAD options are limited by renal failure.  I do not thinks that symptoms current warrant amiodarone. Stop diltiazem and start metoprolol 25mg  BID Ultimately, conservative measures may be best Follow-up with EP in [place]  2. HTN Switch diltiazem to metoprolol as above  3. CAD/ ischemic CM EF 45% Continue medicine optimization by cardiology team  4. ? Bradycardia No bradycardia on monitor Low pulse with nephrology appointment was likely due to PVCs No indication for pacing at this time  Risks, benefits and potential toxicities for medications prescribed and/or refilled reviewed with patient today.    Follow-up:  Dr Lovena Le in [place] in 2 months  Current medicines are reviewed at length with the patient today.   The patient does not have concerns regarding his medicines.  The following changes were made today:  none  Labs/ tests ordered today include:  No orders of the defined types were placed in this encounter.    Army Fossa, MD  06/29/2020 9:39 AM     [facility] [address] [place] [address] [phone] (office) [phone] (fax)

## 2020-06-29 NOTE — Patient Instructions (Addendum)
Medication Instructions:  1 Stop your Cardizem. 2 Start Metoprolol 25 mg two times daily.  Take one tablet by mouth twice daily, once in the morning and once at bedtime.  *If you need a refill on your cardiac medications before your next appointment, please call your pharmacy*  Lab Work: None ordered.  If you have labs (blood work) drawn today and your tests are completely normal, you will receive your results only by: [name] MyChart Message (if you have MyChart) OR . A paper copy in the mail If you have any lab test that is abnormal or we need to change your treatment, we will call you to review the results.  Testing/Procedures: None ordered.  Follow-Up: At [facility], you and your health needs are our priority.  As part of our continuing mission to provide you with exceptional heart care, we have created designated Provider Care Teams.  These Care Teams include your primary Cardiologist (physician) and Advanced Practice Providers (APPs -  Physician Assistants and Nurse Practitioners) who all work together to provide you with the care you need, when you need it.  We recommend signing up for the patient portal called "MyChart".  Sign up information is provided on this After Visit Summary.  MyChart is used to connect with patients for Virtual Visits (Telemedicine).  Patients are able to view lab/test results, encounter notes, upcoming appointments, etc.  Non-urgent messages can be sent to your provider as well.   To learn more about what you can do with MyChart, go to [email or web address].    Your next appointment:   Your physician wants you to follow-up in: with Dr. Lovena Le in [place] in 2 months.    Other Instructions:

## 2020-07-08 ENCOUNTER — Other Ambulatory Visit: Payer: Self-pay

## 2020-07-08 ENCOUNTER — Telehealth: Payer: Self-pay

## 2020-07-08 ENCOUNTER — Encounter (HOSPITAL_COMMUNITY): Payer: Self-pay

## 2020-07-08 ENCOUNTER — Inpatient Hospital Stay (HOSPITAL_COMMUNITY)
Admission: EM | Admit: 2020-07-08 | Discharge: 2020-07-13 | DRG: 286 | Disposition: A | Discharge status: Home / Self Care | Payer: Medicare Other | Attending: Internal Medicine | Admitting: Internal Medicine

## 2020-07-08 ENCOUNTER — Emergency Department (HOSPITAL_COMMUNITY): Payer: Medicare Other

## 2020-07-08 DIAGNOSIS — D696 Thrombocytopenia, unspecified: Secondary | ICD-10-CM | POA: Diagnosis not present

## 2020-07-08 DIAGNOSIS — I5020 Unspecified systolic (congestive) heart failure: Secondary | ICD-10-CM | POA: Diagnosis not present

## 2020-07-08 DIAGNOSIS — N186 End stage renal disease: Secondary | ICD-10-CM

## 2020-07-08 DIAGNOSIS — N2581 Secondary hyperparathyroidism of renal origin: Secondary | ICD-10-CM | POA: Diagnosis present

## 2020-07-08 DIAGNOSIS — E11319 Type 2 diabetes mellitus with unspecified diabetic retinopathy without macular edema: Secondary | ICD-10-CM | POA: Diagnosis not present

## 2020-07-08 DIAGNOSIS — N1832 Chronic kidney disease, stage 3b: Secondary | ICD-10-CM | POA: Diagnosis not present

## 2020-07-08 DIAGNOSIS — K219 Gastro-esophageal reflux disease without esophagitis: Secondary | ICD-10-CM | POA: Diagnosis not present

## 2020-07-08 DIAGNOSIS — N400 Enlarged prostate without lower urinary tract symptoms: Secondary | ICD-10-CM | POA: Diagnosis present

## 2020-07-08 DIAGNOSIS — Z7982 Long term (current) use of aspirin: Secondary | ICD-10-CM

## 2020-07-08 DIAGNOSIS — K739 Chronic hepatitis, unspecified: Secondary | ICD-10-CM | POA: Diagnosis not present

## 2020-07-08 DIAGNOSIS — I272 Pulmonary hypertension, unspecified: Secondary | ICD-10-CM | POA: Diagnosis present

## 2020-07-08 DIAGNOSIS — E875 Hyperkalemia: Secondary | ICD-10-CM | POA: Diagnosis not present

## 2020-07-08 DIAGNOSIS — I255 Ischemic cardiomyopathy: Secondary | ICD-10-CM | POA: Diagnosis not present

## 2020-07-08 DIAGNOSIS — Z7984 Long term (current) use of oral hypoglycemic drugs: Secondary | ICD-10-CM

## 2020-07-08 DIAGNOSIS — Z23 Encounter for immunization: Secondary | ICD-10-CM | POA: Diagnosis not present

## 2020-07-08 DIAGNOSIS — R6889 Other general symptoms and signs: Secondary | ICD-10-CM | POA: Diagnosis not present

## 2020-07-08 DIAGNOSIS — I130 Hypertensive heart and chronic kidney disease with heart failure and stage 1 through stage 4 chronic kidney disease, or unspecified chronic kidney disease: Secondary | ICD-10-CM | POA: Diagnosis not present

## 2020-07-08 DIAGNOSIS — I5021 Acute systolic (congestive) heart failure: Secondary | ICD-10-CM | POA: Diagnosis not present

## 2020-07-08 DIAGNOSIS — Z951 Presence of aortocoronary bypass graft: Secondary | ICD-10-CM | POA: Diagnosis not present

## 2020-07-08 DIAGNOSIS — R7989 Other specified abnormal findings of blood chemistry: Secondary | ICD-10-CM | POA: Diagnosis present

## 2020-07-08 DIAGNOSIS — N184 Chronic kidney disease, stage 4 (severe): Secondary | ICD-10-CM | POA: Diagnosis present

## 2020-07-08 DIAGNOSIS — I2510 Atherosclerotic heart disease of native coronary artery without angina pectoris: Secondary | ICD-10-CM | POA: Diagnosis not present

## 2020-07-08 DIAGNOSIS — D631 Anemia in chronic kidney disease: Secondary | ICD-10-CM | POA: Diagnosis not present

## 2020-07-08 DIAGNOSIS — Z87891 Personal history of nicotine dependence: Secondary | ICD-10-CM

## 2020-07-08 DIAGNOSIS — Z955 Presence of coronary angioplasty implant and graft: Secondary | ICD-10-CM | POA: Diagnosis not present

## 2020-07-08 DIAGNOSIS — I5043 Acute on chronic combined systolic (congestive) and diastolic (congestive) heart failure: Secondary | ICD-10-CM | POA: Diagnosis not present

## 2020-07-08 DIAGNOSIS — I5082 Biventricular heart failure: Secondary | ICD-10-CM | POA: Diagnosis present

## 2020-07-08 DIAGNOSIS — Z79899 Other long term (current) drug therapy: Secondary | ICD-10-CM

## 2020-07-08 DIAGNOSIS — N179 Acute kidney failure, unspecified: Secondary | ICD-10-CM | POA: Diagnosis not present

## 2020-07-08 DIAGNOSIS — R0602 Shortness of breath: Secondary | ICD-10-CM | POA: Diagnosis not present

## 2020-07-08 DIAGNOSIS — E1122 Type 2 diabetes mellitus with diabetic chronic kidney disease: Secondary | ICD-10-CM | POA: Diagnosis present

## 2020-07-08 DIAGNOSIS — I493 Ventricular premature depolarization: Secondary | ICD-10-CM

## 2020-07-08 DIAGNOSIS — I472 Ventricular tachycardia: Secondary | ICD-10-CM | POA: Diagnosis not present

## 2020-07-08 DIAGNOSIS — I10 Essential (primary) hypertension: Secondary | ICD-10-CM | POA: Diagnosis not present

## 2020-07-08 DIAGNOSIS — E782 Mixed hyperlipidemia: Secondary | ICD-10-CM | POA: Diagnosis present

## 2020-07-08 DIAGNOSIS — Z20822 Contact with and (suspected) exposure to covid-19: Secondary | ICD-10-CM | POA: Diagnosis present

## 2020-07-08 DIAGNOSIS — N189 Chronic kidney disease, unspecified: Secondary | ICD-10-CM | POA: Diagnosis present

## 2020-07-08 DIAGNOSIS — I110 Hypertensive heart disease with heart failure: Secondary | ICD-10-CM | POA: Diagnosis not present

## 2020-07-08 DIAGNOSIS — R188 Other ascites: Secondary | ICD-10-CM | POA: Diagnosis not present

## 2020-07-08 DIAGNOSIS — I5023 Acute on chronic systolic (congestive) heart failure: Secondary | ICD-10-CM | POA: Diagnosis not present

## 2020-07-08 DIAGNOSIS — R0902 Hypoxemia: Secondary | ICD-10-CM | POA: Diagnosis not present

## 2020-07-08 DIAGNOSIS — Z743 Need for continuous supervision: Secondary | ICD-10-CM | POA: Diagnosis not present

## 2020-07-08 HISTORY — DX: Ventricular premature depolarization: I49.3

## 2020-07-08 HISTORY — DX: Mixed hyperlipidemia: E78.2

## 2020-07-08 HISTORY — DX: Essential (primary) hypertension: I10

## 2020-07-08 LAB — CBC WITH DIFFERENTIAL/PLATELET
Abs Immature Granulocytes: 0.03 10*3/uL (ref 0.00–0.07)
Basophils Absolute: 0 10*3/uL (ref 0.0–0.1)
Basophils Relative: 0 %
Eosinophils Absolute: 0 10*3/uL (ref 0.0–0.5)
Eosinophils Relative: 0 %
HCT: 32.2 % — ABNORMAL LOW (ref 39.0–52.0)
Hemoglobin: 10.8 g/dL — ABNORMAL LOW (ref 13.0–17.0)
Immature Granulocytes: 0 %
Lymphocytes Relative: 7 %
Lymphs Abs: 0.6 10*3/uL — ABNORMAL LOW (ref 0.7–4.0)
MCH: 32.4 pg (ref 26.0–34.0)
MCHC: 33.5 g/dL (ref 30.0–36.0)
MCV: 96.7 fL (ref 80.0–100.0)
Monocytes Absolute: 0.6 10*3/uL (ref 0.1–1.0)
Monocytes Relative: 7 %
Neutro Abs: 7 10*3/uL (ref 1.7–7.7)
Neutrophils Relative %: 86 %
Platelets: 110 10*3/uL — ABNORMAL LOW (ref 150–400)
RBC: 3.33 MIL/uL — ABNORMAL LOW (ref 4.22–5.81)
RDW: 14.2 % (ref 11.5–15.5)
WBC: 8.2 10*3/uL (ref 4.0–10.5)
nRBC: 0 % (ref 0.0–0.2)

## 2020-07-08 LAB — COMPREHENSIVE METABOLIC PANEL
ALT: 178 U/L — ABNORMAL HIGH (ref 0–44)
AST: 165 U/L — ABNORMAL HIGH (ref 15–41)
Albumin: 3.7 g/dL (ref 3.5–5.0)
Alkaline Phosphatase: 113 U/L (ref 38–126)
Anion gap: 12 (ref 5–15)
BUN: 75 mg/dL — ABNORMAL HIGH (ref 8–23)
CO2: 19 mmol/L — ABNORMAL LOW (ref 22–32)
Calcium: 8.2 mg/dL — ABNORMAL LOW (ref 8.9–10.3)
Chloride: 107 mmol/L (ref 98–111)
Creatinine, Ser: 3.82 mg/dL — ABNORMAL HIGH (ref 0.61–1.24)
GFR, Estimated: 14 mL/min — ABNORMAL LOW (ref >60)
Glucose, Bld: 140 mg/dL — ABNORMAL HIGH (ref 70–99)
Potassium: 5.2 mmol/L — ABNORMAL HIGH (ref 3.5–5.1)
Sodium: 138 mmol/L (ref 135–145)
Total Bilirubin: 2.6 mg/dL — ABNORMAL HIGH (ref 0.3–1.2)
Total Protein: 6.5 g/dL (ref 6.5–8.1)

## 2020-07-08 LAB — RESPIRATORY PANEL BY RT PCR (FLU A&B, COVID)
Influenza A by PCR: NEGATIVE
Influenza B by PCR: NEGATIVE
SARS Coronavirus 2 by RT PCR: NEGATIVE

## 2020-07-08 LAB — CBG MONITORING, ED: Glucose-Capillary: 127 mg/dL — ABNORMAL HIGH (ref 70–99)

## 2020-07-08 LAB — BRAIN NATRIURETIC PEPTIDE: B Natriuretic Peptide: >4500 pg/mL — ABNORMAL HIGH (ref 0.0–100.0)

## 2020-07-08 LAB — TROPONIN I (HIGH SENSITIVITY)
Troponin I (High Sensitivity): 36 ng/L — ABNORMAL HIGH (ref <18)
Troponin I (High Sensitivity): 34 ng/L — ABNORMAL HIGH (ref <18)

## 2020-07-08 MED ORDER — SODIUM CHLORIDE 0.9% FLUSH: 3.0000 mL | INTRAVENOUS | Status: DC | PRN

## 2020-07-08 MED ORDER — INSULIN ASPART 100 UNIT/ML ~~LOC~~ SOLN
0.0000 [IU] | Freq: Three times a day (TID) | SUBCUTANEOUS | Status: DC
  Administered 2020-07-09 (×2): 2 [IU] via SUBCUTANEOUS
  Administered 2020-07-09 – 2020-07-10 (×2): 3 [IU] via SUBCUTANEOUS
  Administered 2020-07-10: 2 [IU] via SUBCUTANEOUS
  Administered 2020-07-11: 3 [IU] via SUBCUTANEOUS
  Administered 2020-07-11: 2 [IU] via SUBCUTANEOUS
  Administered 2020-07-13: 5 [IU] via SUBCUTANEOUS
  Filled 2020-07-08 (×3): qty 1

## 2020-07-08 MED ORDER — FUROSEMIDE 10 MG/ML IJ SOLN
40.0000 mg | Freq: Once | INTRAMUSCULAR | Status: AC
  Administered 2020-07-08: 40 mg via INTRAVENOUS
  Filled 2020-07-08: qty 4

## 2020-07-08 MED ORDER — ACETAMINOPHEN 325 MG PO TABS
650.0000 mg | ORAL_TABLET | ORAL | Status: DC | PRN
  Administered 2020-07-08 – 2020-07-09 (×2): 650 mg via ORAL
  Filled 2020-07-08 (×2): qty 2

## 2020-07-08 MED ORDER — ENOXAPARIN SODIUM 30 MG/0.3ML ~~LOC~~ SOLN
30.0000 mg | SUBCUTANEOUS | Status: DC
  Administered 2020-07-08 – 2020-07-11 (×4): 30 mg via SUBCUTANEOUS
  Filled 2020-07-08 (×4): qty 0.3

## 2020-07-08 MED ORDER — SODIUM CHLORIDE 0.9 % IV SOLN: 250.0000 mL | INTRAVENOUS | Status: DC | PRN

## 2020-07-08 MED ORDER — SODIUM CHLORIDE 0.9% FLUSH
3.0000 mL | Freq: Two times a day (BID) | INTRAVENOUS | Status: DC
  Administered 2020-07-08 – 2020-07-13 (×9): 3 mL via INTRAVENOUS

## 2020-07-08 MED ORDER — FUROSEMIDE 10 MG/ML IJ SOLN
40.0000 mg | Freq: Two times a day (BID) | INTRAMUSCULAR | Status: DC
  Administered 2020-07-08 – 2020-07-09 (×2): 40 mg via INTRAVENOUS
  Filled 2020-07-08 (×2): qty 4

## 2020-07-08 MED ORDER — INSULIN ASPART 100 UNIT/ML ~~LOC~~ SOLN
0.0000 [IU] | Freq: Every day | SUBCUTANEOUS | Status: DC
  Administered 2020-07-12: 2 [IU] via SUBCUTANEOUS

## 2020-07-08 MED ORDER — ONDANSETRON HCL 4 MG/2ML IJ SOLN
4.0000 mg | Freq: Four times a day (QID) | INTRAMUSCULAR | Status: DC | PRN
  Filled 2020-07-08: qty 2

## 2020-07-08 NOTE — ED Triage Notes (Signed)
Pt reports that he feels SOB due to increase swelling in stomach. Feels like he cant take a deep breath. Started last night

## 2020-07-08 NOTE — H&P (Signed)
History and Physical  Bradley Black JHE:[id number] DOB: 19-Jun-1946 DOA: 07/08/2020  Referring physician: Dr Roderic Palau, ED physician PCP: Susy Frizzle, MD  Outpatient Specialists:   Patient Coming From: Home  Chief Complaint: Shortness of breath, abdominal swelling  HPI: Bradley Black is a 74 y.o. male with a history of diabetes with retinopathy, coronary artery disease status post CABG, GERD, hypertension, left carotid artery stenosis, history of systolic chronic heart failure with diffuse hypokinesis on echocardiogram and 10/2018.  Patient presents with shortness of breath that started last night and worsened this morning.  Shortness of breath worsened with laying down flat.  He requires 2 pillows to sleep although needed to sit up in bed last night to sleep.  Shortness of breath worse with activity as well and improves with rest.  Denies leg edema, but notes that his abdomen swells periodically.  Emergency Department Course: Chest x-ray shows cardiomegaly with mild pulmonary edema.  BNP greater than 4500.  High-sensitivity troponin stable at 36 and 34 with a reading 2 hours apart.  Creatinine 3.82.  AST 165, ALT 178.  This is new.  Review of Systems:   Pt denies any fevers, chills, nausea, vomiting, diarrhea, constipation, abdominal pain, shortness of breath, dyspnea on exertion, orthopnea, cough, wheezing, palpitations, headache, vision changes, lightheadedness, dizziness, melena, rectal bleeding.  Review of systems are otherwise negative  Past Medical History:  Diagnosis Date  . BPH (benign prostatic hyperplasia)   . CAD S/P percutaneous coronary angioplasty    a. PTCA of [place] b. PCI with BMS to LAD in 1997 c. RCA PCI [place] d. s/p CABG in 11/2011 with LIMA-LAD, SVG-PDA, SVG-OM2, and SVG-D1  . Chronic back pain   . CKD (chronic kidney disease), stage III ([place])   . Cyst of bursa    R shoulder  . Diabetic retinopathy ([place])   . DM (diabetes mellitus), type 2 with  renal complications ([place])   . Essential hypertension   . Frequent PVCs   . GERD (gastroesophageal reflux disease)   . Ischemic cardiomyopathy 11/2011   Intra-OP TEE: EF 40-45%, no regional WMA; improved Anterior WM post CABG.  . Left carotid artery stenosis   . Mixed hyperlipidemia   . 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 BACK SURGERY  1970  . CARDIAC CATHETERIZATION  2013  . CORONARY ANGIOPLASTY  1994   OM  . CORONARY ANGIOPLASTY  1997   LAD  . CORONARY ANGIOPLASTY WITH STENT PLACEMENT  1999   RCA  . CORONARY ANGIOPLASTY WITH STENT PLACEMENT  2001   RCA  . CORONARY ARTERY BYPASS GRAFT  12/27/2011   Procedure: CORONARY ARTERY BYPASS GRAFTING (CABG);  Surgeon: Rexene Alberts, MD;  Location: [facility];  Service: Open Heart Surgery;  Laterality: N/A;  Times four. On pump. Using endoscopically harvested right greater saphenous vein and left internal mammary artery.   [name] HERNIA REPAIR    . INCISION AND DRAINAGE OF WOUND  2006   [place]  . INTRAOPERATIVE TRANSESOPHAGEAL ECHOCARDIOGRAM  12/27/2011   Global hypokinesis with EF of 40-45%, improved LAD distribution wall motion.  [name] LEFT HEART CATHETERIZATION WITH CORONARY ANGIOGRAM N/A 12/13/2011   Procedure: LEFT HEART CATHETERIZATION WITH CORONARY ANGIOGRAM;  Surgeon: Leonie Man, MD;  Location: [facility] CATH LAB;  Service: Cardiovascular;  Laterality: N/A;  .  LUMBAR FUSION    . MASS EXCISION  10/24/11   R arm  . TRANSESOPHAGEAL ECHOCARDIOGRAM  2013   Social History:  reports that he quit smoking about 33 years ago. His smoking use included cigarettes. He has never used smokeless tobacco. He reports that he does not drink alcohol and does not use drugs. Patient lives at home  No Known Allergies  Family History  Problem Relation Age of Onset  . Cancer Mother        breast  . Cancer Father        bone  . [place] cancer Neg Hx        Prior to Admission medications   Medication Sig Start Date End Date Taking? Authorizing Provider  acetaminophen (TYLENOL) 500 MG tablet Take 1,000 mg by mouth every 6 (six) hours as needed for moderate pain or headache.    [provider]  aspirin EC 81 MG tablet Take 81 mg by mouth daily.    [provider]  calcitRIOL (ROCALTROL) 0.25 MCG capsule Take 0.25 mcg by mouth every Monday, Wednesday, and Friday.  06/03/20   [provider]  cinacalcet (SENSIPAR) 30 MG tablet Take 30 mg by mouth daily with breakfast.  03/23/19   [provider]  citalopram (CELEXA) 10 MG tablet TAKE ONE (1) TABLET BY MOUTH EVERY DAY 08/24/19   Susy Frizzle, MD  doxazosin (CARDURA) 8 MG tablet Take 1 tablet (8 mg total) by mouth daily. 02/17/20   Susy Frizzle, MD  furosemide (LASIX) 20 MG tablet Take 20 mg by mouth daily as needed for edema.  12/23/19   [provider]  hydrALAZINE (APRESOLINE) 50 MG tablet Take 50 mg by mouth 3 (three) times daily. 2 tablets in the morning and 1 tablet at night    [provider]  metoprolol tartrate (LOPRESSOR) 25 MG tablet Take 1 tablet (25 mg total) by mouth 2 (two) times daily. 06/29/20   Allred, Jeneen Rinks, MD  olmesartan (BENICAR) 5 MG tablet Take 5 mg by mouth daily.    [provider]  pantoprazole (PROTONIX) 40 MG tablet TAKE ONE TABLET BY MOUTH TWICE A DAY 09/07/19   Susy Frizzle, MD  pravastatin (PRAVACHOL) 20 MG tablet TAKE ONE (1) TABLET BY MOUTH EVERY DAY 12/21/19   Susy Frizzle, MD  sildenafil (VIAGRA) 100 MG tablet Take 0.5-1 tablets (50-100 mg total) by mouth daily as needed for erectile dysfunction. 05/09/20   Susy Frizzle, MD  sitaGLIPtin (JANUVIA) 50 MG tablet Take 25 mg by mouth daily.    [provider]  sodium bicarbonate 650 MG tablet Take 1,950 mg by mouth daily.     [provider]    Physical Exam: BP (!) 163/87   Pulse (!) 57   Temp (!) 97.5 F (36.4 C)  (Oral)   Resp (!) 23   Ht 5\' 8"  (1.727 m)   Wt 72.6 kg   SpO2 97%   BMI 24.33 kg/m   . General: Elderly male. Awake and alert and oriented x3. No acute cardiopulmonary distress.  [name] HEENT: Normocephalic atraumatic.  Right and left ears normal in appearance.  Pupils equal, round, reactive to light. Extraocular muscles are intact. Sclerae anicteric and noninjected.  Moist mucosal membranes. No mucosal lesions.  . Neck: Neck supple without lymphadenopathy. No carotid bruits. No masses palpated.  . Cardiovascular: Regular rate with normal S1-S2 sounds. No murmurs, rubs, gallops auscultated. No JVD.  [name] Respiratory: Mild rales in bases bilaterally.  No accessory muscle use. . Abdomen: Soft, nontender, nondistended. Active bowel sounds. Mild hepatomegaly . Skin: No rashes, lesions, or ulcerations.  Dry, warm to touch. 2+ dorsalis pedis and radial pulses. . Musculoskeletal: No calf or leg pain. All major joints not erythematous nontender.  No upper or lower joint deformation.  Good ROM.  No contractures  . Psychiatric: Intact judgment and insight. Pleasant and cooperative. . Neurologic: No focal neurological deficits. Strength is 5/5 and symmetric in upper and lower extremities.  Cranial nerves II through XII are grossly intact.           Labs on Admission: I have personally reviewed following labs and imaging studies  CBC: Recent Labs  Lab 07/08/20 1329  WBC 8.2  NEUTROABS 7.0  HGB 10.8*  HCT 32.2*  MCV 96.7  PLT 778*   Basic Metabolic Panel: Recent Labs  Lab 07/08/20 1329  NA 138  K 5.2*  CL 107  CO2 19*  GLUCOSE 140*  BUN 75*  CREATININE 3.82*  CALCIUM 8.2*   GFR: Estimated Creatinine Clearance: 15.9 mL/min (A) (by C-G formula based on SCr of 3.82 mg/dL (H)). Liver Function Tests: Recent Labs  Lab 07/08/20 1329  AST 165*  ALT 178*  ALKPHOS 113  BILITOT 2.6*  PROT 6.5  ALBUMIN 3.7   No results for input(s): LIPASE, AMYLASE in the last 168 hours. No results for  input(s): AMMONIA in the last 168 hours. Coagulation Profile: No results for input(s): INR, PROTIME in the last 168 hours. Cardiac Enzymes: No results for input(s): CKTOTAL, CKMB, CKMBINDEX, TROPONINI in the last 168 hours. BNP (last 3 results) No results for input(s): PROBNP in the last 8760 hours. HbA1C: No results for input(s): HGBA1C in the last 72 hours. CBG: No results for input(s): GLUCAP in the last 168 hours. Lipid Profile: No results for input(s): CHOL, HDL, LDLCALC, TRIG, CHOLHDL, LDLDIRECT in the last 72 hours. Thyroid Function Tests: No results for input(s): TSH, T4TOTAL, FREET4, T3FREE, THYROIDAB in the last 72 hours. Anemia Panel: No results for input(s): VITAMINB12, FOLATE, FERRITIN, TIBC, IRON, RETICCTPCT in the last 72 hours. Urine analysis:    Component Value Date/Time   COLORURINE YELLOW 12/24/2011 1220   APPEARANCEUR CLEAR 12/24/2011 1220   LABSPEC 1.016 12/24/2011 1220   PHURINE 5.5 12/24/2011 1220   GLUCOSEU 100 (A) 12/24/2011 1220   HGBUR NEGATIVE 12/24/2011 1220   BILIRUBINUR NEGATIVE 12/24/2011 1220   KETONESUR NEGATIVE 12/24/2011 1220   PROTEINUR 100 (A) 12/24/2011 1220   UROBILINOGEN 1.0 12/24/2011 1220   NITRITE NEGATIVE 12/24/2011 1220   LEUKOCYTESUR NEGATIVE 12/24/2011 1220   Sepsis Labs: @LABRCNTIP (procalcitonin:4,lacticidven:4) ) Recent Results (from the past 240 hour(s))  Respiratory Panel by RT PCR (Flu A&B, Covid) - Nasopharyngeal Swab     Status: None   Collection Time: 07/08/20  1:35 PM   Specimen: Nasopharyngeal Swab  Result Value Ref Range Status   SARS Coronavirus 2 by RT PCR NEGATIVE NEGATIVE Final    Comment: (NOTE) SARS-CoV-2 target nucleic acids are NOT DETECTED.  The SARS-CoV-2 RNA is generally detectable in upper respiratoy specimens during the acute phase of infection. The lowest concentration of SARS-CoV-2 viral copies this assay can detect is 131 copies/mL. A negative result does not preclude SARS-Cov-2 infection and  should not be used as the sole basis for treatment or other patient management decisions. A negative result may occur with  improper specimen collection/handling, submission of specimen other than nasopharyngeal swab, presence of viral mutation(s) within the areas targeted by this assay,  and inadequate number of viral copies (<131 copies/mL). A negative result must be combined with clinical observations, patient history, and epidemiological information. The expected result is Negative.  Fact Sheet for Patients:  PinkCheek.be  Fact Sheet for Healthcare Providers:  GravelBags.it  This test is no t yet approved or cleared by the [place] FDA and  has been authorized for detection and/or diagnosis of SARS-CoV-2 by FDA under an Emergency Use Authorization (EUA). This EUA will remain  in effect (meaning this test can be used) for the duration of the COVID-19 declaration under Section 564(b)(1) of the Act, 21 U.S.C. section 360bbb-3(b)(1), unless the authorization is terminated or revoked sooner.     Influenza A by PCR NEGATIVE NEGATIVE Final   Influenza B by PCR NEGATIVE NEGATIVE Final    Comment: (NOTE) The Xpert Xpress SARS-CoV-2/FLU/RSV assay is intended as an aid in  the diagnosis of influenza from Nasopharyngeal swab specimens and  should not be used as a sole basis for treatment. Nasal washings and  aspirates are unacceptable for Xpert Xpress SARS-CoV-2/FLU/RSV  testing.  Fact Sheet for Patients: PinkCheek.be  Fact Sheet for Healthcare Providers: GravelBags.it  This test is not yet approved or cleared by the [place] FDA and  has been authorized for detection and/or diagnosis of SARS-CoV-2 by  FDA under an Emergency Use Authorization (EUA). This EUA will remain  in effect (meaning this test can be used) for the duration of the  Covid-19 declaration  under Section 564(b)(1) of the Act, 21  U.S.C. section 360bbb-3(b)(1), unless the authorization is  terminated or revoked. Performed at [facility], [address], [place], [place] [address]      Radiological Exams on Admission: DG Chest Port 1 View  Result Date: 07/08/2020 CLINICAL DATA:  Shortness of breath EXAM: PORTABLE CHEST 1 VIEW COMPARISON:  01/21/2012, 12/31/2011 FINDINGS: Post CABG changes. Stable mild cardiomegaly. There is mild pulmonary vascular congestion and prominent interstitial markings. No large pleural fluid collection. No pneumothorax. IMPRESSION: Findings suggestive of CHF with mild interstitial edema. Electronically Signed   By: Davina Poke D.O.   On: 07/08/2020 14:12    EKG: Independently reviewed.  Sinus rhythm with PVCs left atrial enlargement, nonspecific interventricular conduction delay.  No acute ST changes.  Assessment/Plan: Active Problems:   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   Essential hypertension   Type 2 diabetes mellitus with diabetic chronic kidney disease (HCC)   Anemia in chronic kidney disease   Chronic kidney disease, stage IV (severe) (HCC)   GERD (gastroesophageal reflux disease)   SOB (shortness of breath)   Elevated LFTs    This patient was discussed with the ED physician, including pertinent vitals, physical exam findings, labs, and imaging.  We also discussed care given by the ED provider.  1. Shortness of breath 2. Acute on chronic heart failure Telemetry monitoring Strict I/O Daily Weights Diuresis: lasix 40mg  IV BID Potassium: Serum potassium slightly elevated -we will hold Echo cardiac exam tomorrow Repeat BMP tomorrow We will need to watch creatinine closely given his stage IV chronic kidney disease 3. Coronary artery disease status post CABG x4, hypertension a. Continue beta-blocker and hydralazine b. Hold ACE inhibitor 4. Type 2 diabetes with chronic kidney disease stage IV a. Watch creatinine  closely b. Patient unsure of his current regimen -pharmacy waiting for family for med rec c. Sliding scale insulin d. CBGs before meals and nightly 5. GERD a.  6. Elevated transaminases a. Right upper quadrant ultrasound b.  Viral hepatic panel  DVT prophylaxis: Lovenox Consultants: Cardiology Code Status: Full code Family Communication: None Disposition Plan: Patient should be able to return home following admission   Truett Mainland, DO

## 2020-07-08 NOTE — Telephone Encounter (Signed)
Mr. Bradley Black is having trouble breathing, No fever, has covid vaccine. He's having swelling in his stomach. Mrs. Bradley Black is going to call EMS for Mr. Bradley Black, She wanted his provider to know what was going on.

## 2020-07-08 NOTE — Consult Note (Signed)
Cardiology Consultation:   Patient ID: Bradley Black; [id number]; 03/01/1946   Admit date: 07/08/2020 Date of Consult: 07/08/2020  Primary Care Provider: Susy Frizzle, MD Primary Cardiologist: Former patient of Dr. Bronson Ing Primary Electrophysiologist: Thompson Grayer, MD   Patient Profile:   Bradley Black is a 74 y.o. male with a history of ischemic cardiomyopathy and LVEF 45%, multivessel CAD status post CABG in 2013, CKD stage III, type 2 diabetes mellitus, hypertension, hyperlipidemia, and frequent PVCs who is being seen today for the evaluation of worsening shortness of breath and evidence of congestive heart failure at the request of Dr. Roderic Palau.  History of Present Illness:   Bradley Black last seen in the office by Mr. Bradley Sake NP back in August.  He presents to the [facility] ER complaining of abdominal bloating over the last 2 weeks and more recently shortness of breath and orthopnea, mainly within the last 24 to 48 hours.  He does not describe any obvious angina symptoms, has had no palpitations or syncope, generally feels fatigued.  He was assessed by Dr. Rayann Heman recently in late September for evaluation of frequent PVCs.  Outpatient cardiac monitor had demonstrated frequent PVCs representing approximately 20% total beats.  PVC ablation was not felt to be indicated by Dr. Rayann Heman, he was switched from diltiazem to metoprolol.  Last echocardiogram was in January 2020, LVEF 45% with basal inferior hypokinesis, grade 2 diastolic dysfunction, mild to moderate mitral regurgitation, mild tricuspid regurgitation, and moderate pulmonary hypertension.  He states that he has been taking his medications as directed.  Has deteriorated since last year, creatinine up to 3.82.  Chest x-ray shows mild interstitial edema.  Past Medical History:  Diagnosis Date  . BPH (benign prostatic hyperplasia)   . CAD S/P percutaneous coronary angioplasty    a. PTCA of [place] b. PCI with BMS to LAD  in 1997 c. RCA PCI [place] d. s/p CABG in 11/2011 with LIMA-LAD, SVG-PDA, SVG-OM2, and SVG-D1  . Chronic back pain   . CKD (chronic kidney disease), stage III ([place])   . Cyst of bursa    R shoulder  . Diabetic retinopathy ([place])   . DM (diabetes mellitus), type 2 with renal complications ([place])   . Essential hypertension   . Frequent PVCs   . GERD (gastroesophageal reflux disease)   . Ischemic cardiomyopathy 11/2011   Intra-OP TEE: EF 40-45%, no regional WMA; improved Anterior WM post CABG.  . Left carotid artery stenosis   . Mixed hyperlipidemia   . 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 BACK SURGERY  1970  . CARDIAC CATHETERIZATION  2013  . CORONARY ANGIOPLASTY  1994   OM  . CORONARY ANGIOPLASTY  1997   LAD  . CORONARY ANGIOPLASTY WITH STENT PLACEMENT  1999   RCA  . CORONARY ANGIOPLASTY WITH STENT PLACEMENT  2001   RCA  . CORONARY ARTERY BYPASS GRAFT  12/27/2011   Procedure: CORONARY ARTERY BYPASS GRAFTING (CABG);  Surgeon: Rexene Alberts, MD;  Location: [place];  Service: Open Heart Surgery;  Laterality: N/A;  Times four. On pump. Using endoscopically harvested right greater saphenous vein and left internal mammary artery.   [name] HERNIA REPAIR    . INCISION AND DRAINAGE OF WOUND  2006   axilla  . INTRAOPERATIVE  TRANSESOPHAGEAL ECHOCARDIOGRAM  12/27/2011   Global hypokinesis with EF of 40-45%, improved LAD distribution wall motion.  [name] LEFT HEART CATHETERIZATION WITH CORONARY ANGIOGRAM N/A 12/13/2011   Procedure: LEFT HEART CATHETERIZATION WITH CORONARY ANGIOGRAM;  Surgeon: Leonie Man, MD;  Location[facility] CATH LAB;  Service: Cardiovascular;  Laterality: N/A;  . LUMBAR FUSION    . MASS EXCISION  10/24/11   R arm  . TRANSESOPHAGEAL ECHOCARDIOGRAM  2013     Outpatient Medications: No current facility-administered medications on file prior to encounter.    Current Outpatient Medications on File Prior to Encounter  Medication Sig Dispense Refill  . acetaminophen (TYLENOL) 500 MG tablet Take 1,000 mg by mouth every 6 (six) hours as needed for moderate pain or headache.    [name] aspirin EC 81 MG tablet Take 81 mg by mouth daily.    . calcitRIOL (ROCALTROL) 0.25 MCG capsule Take 0.25 mcg by mouth every Monday, Wednesday, and Friday.     . cinacalcet (SENSIPAR) 30 MG tablet Take 30 mg by mouth daily with breakfast.     . citalopram (CELEXA) 10 MG tablet TAKE ONE (1) TABLET BY MOUTH EVERY DAY 90 tablet 3  . doxazosin (CARDURA) 8 MG tablet Take 1 tablet (8 mg total) by mouth daily. 90 tablet 3  . furosemide (LASIX) 20 MG tablet Take 20 mg by mouth daily as needed for edema.     . hydrALAZINE (APRESOLINE) 50 MG tablet Take 50 mg by mouth 3 (three) times daily. 2 tablets in the morning and 1 tablet at night    . metoprolol tartrate (LOPRESSOR) 25 MG tablet Take 1 tablet (25 mg total) by mouth 2 (two) times daily. 180 tablet 3  . olmesartan (BENICAR) 5 MG tablet Take 5 mg by mouth daily.    . pantoprazole (PROTONIX) 40 MG tablet TAKE ONE TABLET BY MOUTH TWICE A DAY 180 tablet 3  . pravastatin (PRAVACHOL) 20 MG tablet TAKE ONE (1) TABLET BY MOUTH EVERY DAY 90 tablet 3  . sildenafil (VIAGRA) 100 MG tablet Take 0.5-1 tablets (50-100 mg total) by mouth daily as needed for erectile dysfunction. 60 tablet 5  . sitaGLIPtin (JANUVIA) 50 MG tablet Take 25 mg by mouth daily.    . sodium bicarbonate 650 MG tablet Take 1,950 mg by mouth daily.       Allergies:   No Known Allergies  Social History:   Social History   Tobacco Use  . Smoking status: Former Smoker    Types: Cigarettes    Quit date: 10/01/1986    Years since quitting: 33.7  . Smokeless tobacco: Never Used  . Tobacco comment: quit about 30 yrs ago  Substance Use Topics  . Alcohol use: No    Family History:   The patient's family history includes Cancer in his father and mother. There is no  history of Lamarkus cancer.  ROS:  Please see the history of present illness.  Limited appetite, no constipation, no syncope.  All other ROS reviewed and negative.     Physical Exam/Data:   Vitals:   07/08/20 1330 07/08/20 1400 07/08/20 1430 07/08/20 1500  BP: (!) 163/87 (!) 176/86 (!) 170/93 (!) 168/90  Pulse: (!) 54 (!) 53 (!) 53 (!) 51  Resp: 18 19 20  (!) 22  Temp:      TempSrc:      SpO2: 96% 96% 95% 96%  Weight:      Height:       No intake or output data in  the 24 hours ending 07/08/20 1618 Filed Weights   07/08/20 1229  Weight: 72.6 kg   Body mass index is 24.33 kg/m.   Gen: Patient appears comfortable at rest. HEENT: Conjunctiva and lids normal, oropharynx clear. Neck: Supple, elevated JVP, no carotid bruits, no thyromegaly. Lungs: Crackles at the bases, no wheezing. Cardiac: Regular rate and rhythm with ectopy, no S3, soft apical systolic murmur, no pericardial rub. Abdomen: Protuberant, bowel sounds present, no guarding or rebound. Extremities: No pitting edema, distal pulses 2+. Skin: Warm and dry. Musculoskeletal: No kyphosis. Neuropsychiatric: Alert and oriented x3, affect grossly appropriate.  EKG:  An ECG dated 07/08/2020 was personally reviewed today and demonstrated:  Sinus rhythm with PVCs, IVCD with poor R wave progression, rule out old inferior infarct pattern, nonspecific ST changes.  Telemetry:  I personally reviewed telemetry which shows sinus rhythm with PVCs.  Relevant CV Studies:  Echocardiogram 10/27/2018: - Left ventricle: The cavity size was at the upper limits of  normal. There was mild concentric hypertrophy. Systolic function  was mildly to moderately reduced. The estimated ejection fraction  was 45%. Diffuse hypokinesis. Features are consistent with a  pseudonormal left ventricular filling pattern, with concomitant  abnormal relaxation and increased filling pressure (grade 2  diastolic dysfunction). Doppler parameters are  consistent with  high ventricular filling pressure.  - Regional wall motion abnormality: Hypokinesis and scarring of the  basal inferior myocardium.  - Aortic valve: Mildly to moderately calcified annulus. Trileaflet.  - Mitral valve: There was mild to moderate regurgitation.  - Left atrium: The atrium was severely dilated.  - Atrial septum: No defect or patent foramen ovale was identified.  - Tricuspid valve: There was mild regurgitation.  - Pulmonary arteries: Systolic pressure was moderately to severely  increased. PA peak pressure: 55 mm Hg (S).   Laboratory Data:  Chemistry Recent Labs  Lab 07/08/20 1329  NA 138  K 5.2*  CL 107  CO2 19*  GLUCOSE 140*  BUN 75*  CREATININE 3.82*  CALCIUM 8.2*  GFRNONAA 14*  ANIONGAP 12    Recent Labs  Lab 07/08/20 1329  PROT 6.5  ALBUMIN 3.7  AST 165*  ALT 178*  ALKPHOS 113  BILITOT 2.6*   Hematology Recent Labs  Lab 07/08/20 1329  WBC 8.2  RBC 3.33*  HGB 10.8*  HCT 32.2*  MCV 96.7  MCH 32.4  MCHC 33.5  RDW 14.2  PLT 110*   Cardiac Enzymes Recent Labs  Lab 07/08/20 1329  TROPONINIHS 36*   BNP Recent Labs  Lab 07/08/20 1329  BNP >4,500.0*    Radiology/Studies:  DG Chest Port 1 View  Result Date: 07/08/2020 CLINICAL DATA:  Shortness of breath EXAM: PORTABLE CHEST 1 VIEW COMPARISON:  01/21/2012, 12/31/2011 FINDINGS: Post CABG changes. Stable mild cardiomegaly. There is mild pulmonary vascular congestion and prominent interstitial markings. No large pleural fluid collection. No pneumothorax. IMPRESSION: Findings suggestive of CHF with mild interstitial edema. Electronically Signed   By: Davina Poke D.O.   On: 07/08/2020 14:12    Assessment and Plan:   1.  Acute on chronic combined heart failure, LVEF 45% by echocardiogram in January of last year.  Chest x-ray shows mild interstitial edema, BNP elevated at greater than 4500.  This is complicated by acute on chronic renal failure with CKD stage IIIb at  baseline, also frequent PVCs which could be compromising LVEF.  High-sensitivity troponin I level of 36 is not diagnostic of ACS at this point check in light of his renal  insufficiency.  2.  Multivessel CAD status post CABG in 2013 as outlined above.  No definite angina reported.  3.  Frequent PVCs, approximately 20% total beats based on outpatient monitor.  He was evaluated by Dr. Rayann Heman recently and was not felt to be optimal candidate for PVC ablation.  He was switched from Cardizem to Lopressor.  No recent syncope.  4.  Acute on chronic renal failure with CKD stage stage IIIb at baseline.  Current creatinine up to 3.82, mild hyperkalemia at 5.2.  He is on Benicar as an outpatient.  5.  Essential hypertension, blood pressure elevated.  6.  Type 2 diabetes mellitus with diabetic retinopathy.  Patient is being admitted to the hospitalist team for further management.  Would discontinue Benicar at this time.  Continue hydralazine and add Imdur 30 mg daily.  Continue Lopressor for now, although consider switch to Coreg if there is evidence of further LV dysfunction.  Continue Pravachol.  Initiate Lasix 40 mg IV twice daily and follow renal function closely, may need input from nephrology.  Would check a follow-up echocardiogram to reassess LVEF.  He is not a good candidate for cardiac catheterization in light of very high risk of contrast nephropathy, and therefore medical therapy optimization makes the most sense at this point.  Amiodarone could be considered for suppression of PVCs if necessary.  Signed, Rozann Lesches, MD  07/08/2020 4:18 PM
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
## 2020-07-08 NOTE — Telephone Encounter (Signed)
Agree with ems if he is having trouble breathing

## 2020-07-09 ENCOUNTER — Inpatient Hospital Stay (HOSPITAL_COMMUNITY): Payer: Medicare Other

## 2020-07-09 ENCOUNTER — Encounter (HOSPITAL_COMMUNITY): Payer: Self-pay | Admitting: Family Medicine

## 2020-07-09 DIAGNOSIS — R0602 Shortness of breath: Secondary | ICD-10-CM

## 2020-07-09 DIAGNOSIS — I5021 Acute systolic (congestive) heart failure: Secondary | ICD-10-CM | POA: Diagnosis not present

## 2020-07-09 LAB — URINALYSIS, ROUTINE W REFLEX MICROSCOPIC
Bilirubin Urine: NEGATIVE
Glucose, UA: NEGATIVE mg/dL
Hgb urine dipstick: NEGATIVE
Ketones, ur: NEGATIVE mg/dL
Leukocytes,Ua: NEGATIVE
Nitrite: NEGATIVE
Protein, ur: 30 mg/dL — AB
Specific Gravity, Urine: 1.008 (ref 1.005–1.030)
pH: 5 (ref 5.0–8.0)

## 2020-07-09 LAB — CBG MONITORING, ED
Glucose-Capillary: 129 mg/dL — ABNORMAL HIGH (ref 70–99)
Glucose-Capillary: 145 mg/dL — ABNORMAL HIGH (ref 70–99)
Glucose-Capillary: 136 mg/dL — ABNORMAL HIGH (ref 70–99)
Glucose-Capillary: 151 mg/dL — ABNORMAL HIGH (ref 70–99)

## 2020-07-09 LAB — MRSA PCR SCREENING: MRSA by PCR: NEGATIVE

## 2020-07-09 LAB — COMPREHENSIVE METABOLIC PANEL
ALT: 322 U/L — ABNORMAL HIGH (ref 0–44)
AST: 290 U/L — ABNORMAL HIGH (ref 15–41)
Albumin: 3.5 g/dL (ref 3.5–5.0)
Alkaline Phosphatase: 102 U/L (ref 38–126)
Anion gap: 13 (ref 5–15)
BUN: 83 mg/dL — ABNORMAL HIGH (ref 8–23)
CO2: 19 mmol/L — ABNORMAL LOW (ref 22–32)
Calcium: 8.2 mg/dL — ABNORMAL LOW (ref 8.9–10.3)
Chloride: 106 mmol/L (ref 98–111)
Creatinine, Ser: 4.37 mg/dL — ABNORMAL HIGH (ref 0.61–1.24)
GFR, Estimated: 12 mL/min — ABNORMAL LOW (ref >60)
Glucose, Bld: 148 mg/dL — ABNORMAL HIGH (ref 70–99)
Potassium: 5.1 mmol/L (ref 3.5–5.1)
Sodium: 138 mmol/L (ref 135–145)
Total Bilirubin: 2.5 mg/dL — ABNORMAL HIGH (ref 0.3–1.2)
Total Protein: 6.3 g/dL — ABNORMAL LOW (ref 6.5–8.1)

## 2020-07-09 LAB — HEMOGLOBIN A1C
Hgb A1c MFr Bld: 5.7 % — ABNORMAL HIGH (ref 4.8–5.6)
Mean Plasma Glucose: 116.89 mg/dL

## 2020-07-09 LAB — HEPATITIS PANEL, ACUTE
HCV Ab: NONREACTIVE
Hep A IgM: NONREACTIVE
Hep B C IgM: NONREACTIVE
Hepatitis B Surface Ag: NONREACTIVE

## 2020-07-09 LAB — GLUCOSE, CAPILLARY: Glucose-Capillary: 154 mg/dL — ABNORMAL HIGH (ref 70–99)

## 2020-07-09 LAB — CREATININE, URINE, RANDOM: Creatinine, Urine: 34.25 mg/dL

## 2020-07-09 LAB — SODIUM, URINE, RANDOM: Sodium, Ur: 118 mmol/L

## 2020-07-09 MED ORDER — INFLUENZA VAC A&B SA ADJ QUAD 0.5 ML IM PRSY
0.5000 mL | PREFILLED_SYRINGE | INTRAMUSCULAR | Status: AC
  Administered 2020-07-10: 0.5 mL via INTRAMUSCULAR

## 2020-07-09 MED ORDER — ISOSORBIDE MONONITRATE ER 30 MG PO TB24
30.0000 mg | ORAL_TABLET | Freq: Every day | ORAL | Status: DC
  Administered 2020-07-09 – 2020-07-13 (×5): 30 mg via ORAL
  Filled 2020-07-09 (×7): qty 1

## 2020-07-09 MED ORDER — METOPROLOL TARTRATE 25 MG PO TABS
25.0000 mg | ORAL_TABLET | Freq: Two times a day (BID) | ORAL | Status: DC
  Administered 2020-07-09 – 2020-07-12 (×7): 25 mg via ORAL
  Filled 2020-07-09 (×7): qty 1

## 2020-07-09 MED ORDER — CITALOPRAM HYDROBROMIDE 10 MG PO TABS
10.0000 mg | ORAL_TABLET | Freq: Every day | ORAL | Status: DC
  Administered 2020-07-09 – 2020-07-13 (×5): 10 mg via ORAL
  Filled 2020-07-09 (×7): qty 1

## 2020-07-09 MED ORDER — SODIUM BICARBONATE 650 MG PO TABS
1950.0000 mg | ORAL_TABLET | Freq: Every day | ORAL | Status: DC
  Administered 2020-07-09 – 2020-07-13 (×5): 1950 mg via ORAL
  Filled 2020-07-09 (×7): qty 3

## 2020-07-09 MED ORDER — ASPIRIN EC 81 MG PO TBEC
81.0000 mg | DELAYED_RELEASE_TABLET | Freq: Every day | ORAL | Status: DC
  Administered 2020-07-09 – 2020-07-11 (×3): 81 mg via ORAL
  Filled 2020-07-09 (×3): qty 1

## 2020-07-09 MED ORDER — HYDRALAZINE HCL 50 MG PO TABS
50.0000 mg | ORAL_TABLET | Freq: Three times a day (TID) | ORAL | Status: DC
  Administered 2020-07-09 – 2020-07-13 (×12): 50 mg via ORAL
  Filled 2020-07-09 (×2): qty 2
  Filled 2020-07-09: qty 1
  Filled 2020-07-09 (×6): qty 2
  Filled 2020-07-09: qty 1
  Filled 2020-07-09 (×2): qty 2

## 2020-07-09 MED ORDER — DOXAZOSIN MESYLATE 2 MG PO TABS
8.0000 mg | ORAL_TABLET | Freq: Every day | ORAL | Status: DC
  Administered 2020-07-09 – 2020-07-13 (×5): 8 mg via ORAL
  Filled 2020-07-09 (×7): qty 4

## 2020-07-09 MED ORDER — ACETAMINOPHEN 500 MG PO TABS: 1000.0000 mg | ORAL_TABLET | Freq: Four times a day (QID) | ORAL | Status: DC | PRN

## 2020-07-09 MED ORDER — FUROSEMIDE 10 MG/ML IJ SOLN
60.0000 mg | Freq: Two times a day (BID) | INTRAMUSCULAR | Status: DC
  Administered 2020-07-09 – 2020-07-10 (×2): 60 mg via INTRAVENOUS
  Filled 2020-07-09 (×2): qty 6

## 2020-07-09 MED ORDER — PANTOPRAZOLE SODIUM 40 MG PO TBEC
40.0000 mg | DELAYED_RELEASE_TABLET | Freq: Two times a day (BID) | ORAL | Status: DC
  Administered 2020-07-09 – 2020-07-13 (×9): 40 mg via ORAL
  Filled 2020-07-09 (×9): qty 1

## 2020-07-09 MED ORDER — PRAVASTATIN SODIUM 10 MG PO TABS
20.0000 mg | ORAL_TABLET | Freq: Every day | ORAL | Status: DC
  Administered 2020-07-09: 20 mg via ORAL
  Filled 2020-07-09 (×3): qty 2

## 2020-07-09 MED ORDER — CALCITRIOL 0.25 MCG PO CAPS
0.2500 ug | ORAL_CAPSULE | ORAL | Status: DC
  Administered 2020-07-11 – 2020-07-13 (×2): 0.25 ug via ORAL
  Filled 2020-07-09 (×3): qty 1

## 2020-07-09 NOTE — Progress Notes (Signed)
PROGRESS NOTE    Bradley Black  EPP:[id number] DOB: [birth year] DOA: 07/08/2020 PCP: Susy Frizzle, MD    Brief Narrative:  This 74 years old male with medical history significant for ischemic cardiomyopathy (LVEF = 45%) Multivessel CAD s/p CABG in 2013, CKD stage III, type 2 diabetes, hypertension, hyperlipidemia, frequent PVCs presents in the emergency department with worsening shortness of breath associated with orthopnea and exertional shortness of breath. 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  Cardiology consulted recommended continue IV Lasix,  repeat echocardiogram. Nephrology consulted given worsening renal functions. Patient is not a good candidate for cardiac catheterization in view of contrast nephropathy , advised medical optimization.  Assessment & Plan:   Active Problems:   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   Essential hypertension   Type 2 diabetes mellitus with diabetic chronic kidney disease (HCC)   Anemia in chronic kidney disease   Chronic kidney disease, stage IV (severe) (HCC)   GERD (gastroesophageal reflux disease)   SOB (shortness of breath)   Elevated LFTs  # Acute on chronic combined heart failure Exacerbation: Patient presents with shortness of breath, orthopnea, fatigue. Chest x-ray shows mild interstitial edema, BNP 4500 Admitted to telemetry, daily weights, strict intake output charting Continue Lasix 40 mg IV twice daily. Hold Benicar for now. Repeat echocardiogram. Continue to watch closely serum creatinine given stage IV kidney disease.  # CAD, S/P CABG: Continue beta-blocker and hydralazine,  Hold ACE inhibitor due to worsening renal functions Add Imdur 30 mg p.o. daily. Troponin slightly elevated could be secondary to CKD. Patient denies any chest pain palpitation dizziness.  #Type 2 diabetes with chronic kidney disease stage IV Watch creatinine closely Sliding  scale insulin. CBGs before meals and nightly.  #AKI on CKD stage IV Baseline creatinine runs between 2.4-2.6,  Avoid nephrotoxic medications,  Nephrology consulted, awaiting recommendations. Recheck labs tomorrow morning.  #Elevated liver enzymes: Hepatitis panel negative. Ultrasound no acute process or reasoning for elevated liver enzymes  # GERD Continue Protonix.   DVT prophylaxis: Lovenox Code Status: Full Family Communication:  No one at bed side. Disposition Plan:  Status is: Inpatient  Remains inpatient appropriate because:Inpatient level of care appropriate due to severity of illness   Dispo: The patient is from: Home              Anticipated d/c is toTBD              Anticipated d/c date is: 2 days              Patient currently is not medically stable to d/c.   Consultants:   Cardiology  Procedures: Echocardiogram Antimicrobials:  Anti-infectives (From admission, onward)   None      Subjective: Patient was seen and examined at bedside.  Overnight events noted.  Patient reports feeling slightly better feels generally weak.  Denies any abdominal pain.  Objective: Vitals:   07/09/20 1300 07/09/20 1330 07/09/20 1400 07/09/20 1430  BP: (!) 157/95 (!) 159/95 (!) 168/96 (!) 165/95  Pulse: (!) 56 (!) 55 (!) 56 (!) 51  Resp: 20 20 20 20   Temp:      TempSrc:      SpO2: 92% 90% 94% 96%  Weight:      Height:        Intake/Output Summary (Last 24 hours) at 07/09/2020 1538 Last data filed at 07/09/2020 1506 Gross per 24 hour  Intake --  Output 1150 ml  Net -1150 ml   Filed Weights   07/08/20 1229  Weight: 72.6 kg    Examination:  General exam: Appears calm and comfortable.  Respiratory system: Clear to auscultation. Respiratory effort normal. Cardiovascular system: S1 & S2 heard, RRR. No JVD, murmurs, rubs, gallops or clicks. No pedal edema. Gastrointestinal system: Abdomen is nondistended, soft and nontender. No organomegaly or masses felt.  Normal bowel sounds heard. Central nervous system: Alert and oriented. No focal neurological deficits. Extremities: No edema, no cyanosis no clubbing. Skin: No rashes, lesions or ulcers Psychiatry: Judgement and insight appear normal. Mood & affect appropriate.     Data Reviewed: I have personally reviewed following labs and imaging studies  CBC: Recent Labs  Lab 07/08/20 1329  WBC 8.2  NEUTROABS 7.0  HGB 10.8*  HCT 32.2*  MCV 96.7  PLT 831*   Basic Metabolic Panel: Recent Labs  Lab 07/08/20 1329 07/09/20 0440  NA 138 138  K 5.2* 5.1  CL 107 106  CO2 19* 19*  GLUCOSE 140* 148*  BUN 75* 83*  CREATININE 3.82* 4.37*  CALCIUM 8.2* 8.2*   GFR: Estimated Creatinine Clearance: 13.9 mL/min (A) (by C-G formula based on SCr of 4.37 mg/dL (H)). Liver Function Tests: Recent Labs  Lab 07/08/20 1329 07/09/20 0440  AST 165* 290*  ALT 178* 322*  ALKPHOS 113 102  BILITOT 2.6* 2.5*  PROT 6.5 6.3*  ALBUMIN 3.7 3.5   No results for input(s): LIPASE, AMYLASE in the last 168 hours. No results for input(s): AMMONIA in the last 168 hours. Coagulation Profile: No results for input(s): INR, PROTIME in the last 168 hours. Cardiac Enzymes: No results for input(s): CKTOTAL, CKMB, CKMBINDEX, TROPONINI in the last 168 hours. BNP (last 3 results) No results for input(s): PROBNP in the last 8760 hours. HbA1C: Recent Labs    07/08/20 1329  HGBA1C 5.7*   CBG: Recent Labs  Lab 07/08/20 2215 07/09/20 0723 07/09/20 1211  GLUCAP 127* 129* 145*   Lipid Profile: No results for input(s): CHOL, HDL, LDLCALC, TRIG, CHOLHDL, LDLDIRECT in the last 72 hours. Thyroid Function Tests: No results for input(s): TSH, T4TOTAL, FREET4, T3FREE, THYROIDAB in the last 72 hours. Anemia Panel: No results for input(s): VITAMINB12, FOLATE, FERRITIN, TIBC, IRON, RETICCTPCT in the last 72 hours. Sepsis Labs: No results for input(s): PROCALCITON, LATICACIDVEN in the last 168 hours.  Recent Results  (from the past 240 hour(s))  Respiratory Panel by RT PCR (Flu A&B, Covid) - Nasopharyngeal Swab     Status: None   Collection Time: 07/08/20  1:35 PM   Specimen: Nasopharyngeal Swab  Result Value Ref Range Status   SARS Coronavirus 2 by RT PCR NEGATIVE NEGATIVE Final    Comment: (NOTE) SARS-CoV-2 target nucleic acids are NOT DETECTED.  The SARS-CoV-2 RNA is generally detectable in upper respiratoy specimens during the acute phase of infection. The lowest concentration of SARS-CoV-2 viral copies this assay can detect is 131 copies/mL. A negative result does not preclude SARS-Cov-2 infection and should not be used as the sole basis for treatment or other patient management decisions. A negative result may occur with  improper specimen collection/handling, submission of specimen other than nasopharyngeal swab, presence of viral mutation(s) within the areas targeted by this assay, and inadequate number of viral copies (<131 copies/mL). A negative result must be combined with clinical observations, patient history, and epidemiological information. The expected result is Negative.  Fact Sheet for Patients:  PinkCheek.be  Fact Sheet for Healthcare Providers:  GravelBags.it  This test is no t yet approved or cleared by the [place] and  has been authorized for detection and/or diagnosis of SARS-CoV-2 by FDA under an Emergency Use Authorization (EUA). This EUA will remain  in effect (meaning this test can be used) for the duration of the COVID-19 declaration under Section 564(b)(1) of the Act, 21 U.S.C. section 360bbb-3(b)(1), unless the authorization is terminated or revoked sooner.     Influenza A by PCR NEGATIVE NEGATIVE Final   Influenza B by PCR NEGATIVE NEGATIVE Final    Comment: (NOTE) The Xpert Xpress SARS-CoV-2/FLU/RSV assay is intended as an aid in  the diagnosis of influenza from Nasopharyngeal swab specimens  and  should not be used as a sole basis for treatment. Nasal washings and  aspirates are unacceptable for Xpert Xpress SARS-CoV-2/FLU/RSV  testing.  Fact Sheet for Patients: PinkCheek.be  Fact Sheet for Healthcare Providers: GravelBags.it  This test is not yet approved or cleared by the [place] FDA and  has been authorized for detection and/or diagnosis of SARS-CoV-2 by  FDA under an Emergency Use Authorization (EUA). This EUA will remain  in effect (meaning this test can be used) for the duration of the  Covid-19 declaration under Section 564(b)(1) of the Act, 21  U.S.C. section 360bbb-3(b)(1), unless the authorization is  terminated or revoked. Performed at [facility], [address], [place], [place] [address]          Radiology Studies: [facility] 1 View  Result Date: 07/08/2020 CLINICAL DATA:  Shortness of breath EXAM: PORTABLE CHEST 1 VIEW COMPARISON:  01/21/2012, 12/31/2011 FINDINGS: Post CABG changes. Stable mild cardiomegaly. There is mild pulmonary vascular congestion and prominent interstitial markings. No large pleural fluid collection. No pneumothorax. IMPRESSION: Findings suggestive of CHF with mild interstitial edema. Electronically Signed   By: Davina Poke D.O.   On: 07/08/2020 14:12   US Abdomen Limited RUQ  Result Date: 07/09/2020 CLINICAL DATA:  Elevated liver function tests. EXAM: ULTRASOUND ABDOMEN LIMITED RIGHT UPPER QUADRANT COMPARISON:  None. FINDINGS: Gallbladder: No gallstones or wall thickening visualized. No sonographic Murphy sign noted by sonographer. Common bile duct: Diameter: Normal, 6 mm. Liver: No focal lesion identified. Within normal limits in parenchymal echogenicity. Portal vein is patent on color Doppler imaging with normal direction of blood flow towards the liver. Other: Perihepatic small volume ascites. Right-sided pleural effusion. Increased echogenicity within the  right kidney incidentally noted. IMPRESSION: No acute process or explanation for elevated liver function tests. Small volume ascites and right pleural effusion, suggesting fluid overload. Increased renal echogenicity, likely related to medical renal disease. Electronically Signed   By: Abigail Miyamoto M.D.   On: 07/09/2020 11:44        Scheduled Meds: . aspirin EC  81 mg Oral Daily  . [START ON 07/11/2020] calcitRIOL  0.25 mcg Oral Q M,W,F  . citalopram  10 mg Oral Daily  . doxazosin  8 mg Oral Daily  . enoxaparin (LOVENOX) injection  30 mg Subcutaneous Q24H  . furosemide  40 mg Intravenous Q12H  . hydrALAZINE  50 mg Oral TID  . insulin aspart  0-15 Units Subcutaneous TID WC  . insulin aspart  0-5 Units Subcutaneous QHS  . isosorbide mononitrate  30 mg Oral Daily  . metoprolol tartrate  25 mg Oral BID  . pantoprazole  40 mg Oral BID  . pravastatin  20 mg Oral QHS  . sodium bicarbonate  1,950 mg Oral Daily  . sodium chloride flush  3 mL Intravenous Q12H   Continuous Infusions: . sodium chloride       LOS: 1 day    Time spent: 35 mins.    Shawna Clamp, MD [facility]   If 7PM-7AM, please contact night-coverage

## 2020-07-09 NOTE — Consult Note (Signed)
[facility]  INPATIENT CONSULTATION  Reason for Consultation: AKI  Requesting Provider: Dr. Dwyane Dee  HPI: Maximum L Ketchum is an 74 y.o. male with CAD h/o PCI and CABG, HTN, DM, HFrEF, HL and CKD to is currently admitted with dyspnea and seen by nephrology for eval and management of AKI on CKD.   Presented from home yesterday pm with dyspnea and abd swelling.  C/o orthopnea.  CXR with mild pulmonary edema, BNP > 4500.  He's been hypertensive.  Cr 3.82 and 4.4 today.  Mild transaminitis with AST 290, ALT 322, T bili 2.5 being eval with RUQ [place] and viral studies.   Being treated with furosemide 40 BID. UOP 933mL yesterday, 254mL documented today.  Cardiology has been consulted and recommended diuresis and is repeating a TTE.    He currently reports breathing is improved somewhat.  No f/c/cough.  No NSAID use, no recent ABx, no new LUTs.   Wife is bedside.    Follows with Dr. Theador Hawthorne for diabetic nephropathy --> 05/2020 visit Cr at "new baseline" noted to be 3.1.    PMH: Past Medical History:  Diagnosis Date  . BPH (benign prostatic hyperplasia)   . CAD S/P percutaneous coronary angioplasty    a. PTCA of [place] b. PCI with BMS to LAD in 1997 c. RCA PCI [place] d. s/p CABG in 11/2011 with LIMA-LAD, SVG-PDA, SVG-OM2, and SVG-D1  . Chronic back pain   . CKD (chronic kidney disease), stage III ([place])   . Cyst of bursa    R shoulder  . Diabetic retinopathy ([place])   . DM (diabetes mellitus), type 2 with renal complications ([place])   . Essential hypertension   . Frequent PVCs   . GERD (gastroesophageal reflux disease)   . Ischemic cardiomyopathy 11/2011   Intra-OP TEE: EF 40-45%, no regional WMA; improved Anterior WM post CABG.  . Left carotid artery stenosis   . Mixed hyperlipidemia   . 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 BACK SURGERY  1970   . CARDIAC CATHETERIZATION  2013  . CORONARY ANGIOPLASTY  1994   OM  . CORONARY ANGIOPLASTY  1997   LAD  . CORONARY ANGIOPLASTY WITH STENT PLACEMENT  1999   RCA  . CORONARY ANGIOPLASTY WITH STENT PLACEMENT  2001   RCA  . CORONARY ARTERY BYPASS GRAFT  12/27/2011   Procedure: CORONARY ARTERY BYPASS GRAFTING (CABG);  Surgeon: Rexene Alberts, MD;  Location: [place];  Service: Open Heart Surgery;  Laterality: N/A;  Times four. On pump. Using endoscopically harvested right greater saphenous vein and left internal mammary artery.   [name] HERNIA REPAIR    . INCISION AND DRAINAGE OF WOUND  2006   [place]  . INTRAOPERATIVE TRANSESOPHAGEAL ECHOCARDIOGRAM  12/27/2011   Global hypokinesis with EF of 40-45%, improved LAD distribution wall motion.  [name] LEFT HEART CATHETERIZATION WITH CORONARY ANGIOGRAM N/A 12/13/2011   Procedure: LEFT HEART CATHETERIZATION WITH CORONARY ANGIOGRAM;  Surgeon: Leonie Man, MD;  Location: [facility] CATH LAB;  Service: Cardiovascular;  Laterality: N/A;  . LUMBAR FUSION    . MASS EXCISION  10/24/11   R arm  . TRANSESOPHAGEAL ECHOCARDIOGRAM  2013    Past Medical History:  Diagnosis Date  . BPH (benign prostatic hyperplasia)   . CAD S/P percutaneous coronary angioplasty  a. PTCA of [place] b. PCI with BMS to LAD in 1997 c. RCA PCI [place] d. s/p CABG in 11/2011 with LIMA-LAD, SVG-PDA, SVG-OM2, and SVG-D1  . Chronic back pain   . CKD (chronic kidney disease), stage III ([place])   . Cyst of bursa    R shoulder  . Diabetic retinopathy ([place])   . DM (diabetes mellitus), type 2 with renal complications ([place])   . Essential hypertension   . Frequent PVCs   . GERD (gastroesophageal reflux disease)   . Ischemic cardiomyopathy 11/2011   Intra-OP TEE: EF 40-45%, no regional WMA; improved Anterior WM post CABG.  . Left carotid artery stenosis   . Mixed hyperlipidemia   . 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   (Not in a hospital admission)   ALLERGIES:  No Known Allergies  FAM HX: Family History  Problem Relation Age of Onset  . Cancer Mother        breast  . Cancer Father        bone  . Kit cancer Neg Hx     Social History:   reports that he quit smoking about 33 years ago. His smoking use included cigarettes. He has never used smokeless tobacco. He reports that he does not drink alcohol and does not use drugs.  ROS: 12 system ROS per HPI  Blood pressure (!) 165/95, pulse (!) 51, temperature (!) 97.5 F (36.4 C), temperature source Oral, resp. rate 20, height 5\' 8"  (1.727 m), weight 72.6 kg, SpO2 96 %. PHYSICAL EXAM: Gen: thin, tired but nontoxic  Eyes:  EOMI ENT: MMM Neck: JVD to 9-10cm at 30 deg CV: brady, irregular with PVCs on monitor Abd:  Soft, mod distended, no clear fluid wave Back: normal WOB lying back, +basilar rales GU: no foley Extr:  No edema Neuro:nonfocal    Results for orders placed or performed during the hospital encounter of 07/08/20 (from the past 48 hour(s))  CBC with Differential/Platelet     Status: Abnormal   Collection Time: 07/08/20  1:29 PM  Result Value Ref Range   WBC 8.2 4.0 - 10.5 K/uL   RBC 3.33 (L) 4.22 - 5.81 MIL/uL   Hemoglobin 10.8 (L) 13.0 - 17.0 g/dL   HCT 32.2 (L) 39 - 52 %   MCV 96.7 80.0 - 100.0 fL   MCH 32.4 26.0 - 34.0 pg   MCHC 33.5 30.0 - 36.0 g/dL   RDW 14.2 11.5 - 15.5 %   Platelets 110 (L) 150 - 400 K/uL    Comment: REPEATED TO VERIFY PLATELET COUNT CONFIRMED BY SMEAR SPECIMEN CHECKED FOR CLOTS Immature Platelet Fraction may be clinically indicated, consider ordering this additional test [id number]    nRBC 0.0 0.0 - 0.2 %   Neutrophils Relative % 86 %   Neutro Abs 7.0 1.7 - 7.7 K/uL   Lymphocytes Relative 7 %   Lymphs Abs 0.6 (L) 0.7 - 4.0 K/uL   Monocytes Relative 7 %   Monocytes Absolute 0.6 0.1 - 1.0 K/uL   Eosinophils Relative 0 %    Eosinophils Absolute 0.0 0 - 0 K/uL   Basophils Relative 0 %   Basophils Absolute 0.0 0 - 0 K/uL   Immature Granulocytes 0 %   Abs Immature Granulocytes 0.03 0.00 - 0.07  K/uL    Comment: Performed at [facility], [address], [place], [place] [address]  Comprehensive metabolic panel     Status: Abnormal   Collection Time: 07/08/20  1:29 PM  Result Value Ref Range   Sodium 138 135 - 145 mmol/L   Potassium 5.2 (H) 3.5 - 5.1 mmol/L   Chloride 107 98 - 111 mmol/L   CO2 19 (L) 22 - 32 mmol/L   Glucose, Bld 140 (H) 70 - 99 mg/dL    Comment: Glucose reference range applies only to samples taken after fasting for at least 8 hours.   BUN 75 (H) 8 - 23 mg/dL   Creatinine, Ser 3.82 (H) 0.61 - 1.24 mg/dL   Calcium 8.2 (L) 8.9 - 10.3 mg/dL   Total Protein 6.5 6.5 - 8.1 g/dL   Albumin 3.7 3.5 - 5.0 g/dL   AST 165 (H) 15 - 41 U/L   ALT 178 (H) 0 - 44 U/L   Alkaline Phosphatase 113 38 - 126 U/L   Total Bilirubin 2.6 (H) 0.3 - 1.2 mg/dL   GFR, Estimated 14 (L) >60 mL/min   Anion gap 12 5 - 15    Comment: Performed at [facility], [address], [place], [place] [address]  Brain natriuretic peptide     Status: Abnormal   Collection Time: 07/08/20  1:29 PM  Result Value Ref Range   B Natriuretic Peptide >4,500.0 (H) 0.0 - 100.0 pg/mL    Comment: Performed at [facility], [address], [place], [place] [address]  Troponin I (High Sensitivity)     Status: Abnormal   Collection Time: 07/08/20  1:29 PM  Result Value Ref Range   Troponin I (High Sensitivity) 36 (H) <18 ng/L    Comment: (NOTE) Elevated high sensitivity troponin I (hsTnI) values and significant  changes across serial measurements may suggest ACS but many other  chronic and acute conditions are known to elevate hsTnI results.  Refer to the "Links" section for chest pain algorithms and additional  guidance. Performed at [facility], [address], [place], [place] [address]   Hemoglobin A1c     Status: Abnormal    Collection Time: 07/08/20  1:29 PM  Result Value Ref Range   Hgb A1c MFr Bld 5.7 (H) 4.8 - 5.6 %    Comment: (NOTE) Pre diabetes:          5.7%-6.4%  Diabetes:              >6.4%  Glycemic control for   <7.0% adults with diabetes    Mean Plasma Glucose 116.89 mg/dL    Comment: Performed at [facility] [address] [place], [place] [address]  Hepatitis panel, acute     Status: None   Collection Time: 07/08/20  1:29 PM  Result Value Ref Range   Hepatitis B Surface Ag NON REACTIVE NON REACTIVE   HCV Ab NON REACTIVE NON REACTIVE    Comment: (NOTE) Nonreactive HCV antibody screen is consistent with no HCV infections,  unless recent infection is suspected or other evidence exists to indicate HCV infection.     Hep A IgM NON REACTIVE NON REACTIVE   Hep B C IgM NON REACTIVE NON REACTIVE    Comment: Performed at [facility], [place] [address] [place], [place] [address]  Respiratory Panel by RT PCR (Flu A&B, Covid) - Nasopharyngeal Swab     Status: None   Collection Time: 07/08/20  1:35 PM   Specimen:  Nasopharyngeal Swab  Result Value Ref Range   SARS Coronavirus 2 by RT PCR NEGATIVE NEGATIVE    Comment: (NOTE) SARS-CoV-2 target nucleic acids are NOT DETECTED.  The SARS-CoV-2 RNA is generally detectable in upper respiratoy specimens during the acute phase of infection. The lowest concentration of SARS-CoV-2 viral copies this assay can detect is 131 copies/mL. A negative result does not preclude SARS-Cov-2 infection and should not be used as the sole basis for treatment or other patient management decisions. A negative result may occur with  improper specimen collection/handling, submission of specimen other than nasopharyngeal swab, presence of viral mutation(s) within the areas targeted by this assay, and inadequate number of viral copies (<131 copies/mL). A negative result must be combined with clinical observations, patient history, and epidemiological  information. The expected result is Negative.  Fact Sheet for Patients:  PinkCheek.be  Fact Sheet for Healthcare Providers:  GravelBags.it  This test is no t yet approved or cleared by the [place] FDA and  has been authorized for detection and/or diagnosis of SARS-CoV-2 by FDA under an Emergency Use Authorization (EUA). This EUA will remain  in effect (meaning this test can be used) for the duration of the COVID-19 declaration under Section 564(b)(1) of the Act, 21 U.S.C. section 360bbb-3(b)(1), unless the authorization is terminated or revoked sooner.     Influenza A by PCR NEGATIVE NEGATIVE   Influenza B by PCR NEGATIVE NEGATIVE    Comment: (NOTE) The Xpert Xpress SARS-CoV-2/FLU/RSV assay is intended as an aid in  the diagnosis of influenza from Nasopharyngeal swab specimens and  should not be used as a sole basis for treatment. Nasal washings and  aspirates are unacceptable for Xpert Xpress SARS-CoV-2/FLU/RSV  testing.  Fact Sheet for Patients: PinkCheek.be  Fact Sheet for Healthcare Providers: GravelBags.it  This test is not yet approved or cleared by the [place] FDA and  has been authorized for detection and/or diagnosis of SARS-CoV-2 by  FDA under an Emergency Use Authorization (EUA). This EUA will remain  in effect (meaning this test can be used) for the duration of the  Covid-19 declaration under Section 564(b)(1) of the Act, 21  U.S.C. section 360bbb-3(b)(1), unless the authorization is  terminated or revoked. Performed at [facility], [address] [place], [place] [address]   Troponin I (High Sensitivity)     Status: Abnormal   Collection Time: 07/08/20  3:45 PM  Result Value Ref Range   Troponin I (High Sensitivity) 34 (H) <18 ng/L    Comment: (NOTE) Elevated high sensitivity troponin I (hsTnI) values and significant  changes  across serial measurements may suggest ACS but many other  chronic and acute conditions are known to elevate hsTnI results.  Refer to the "Links" section for chest pain algorithms and additional  guidance. Performed at [facility], [address] [place], [place] [address]   CBG monitoring, ED     Status: Abnormal   Collection Time: 07/08/20 10:15 PM  Result Value Ref Range   Glucose-Capillary 127 (H) 70 - 99 mg/dL    Comment: Glucose reference range applies only to samples taken after fasting for at least 8 hours.  Comprehensive metabolic panel     Status: Abnormal   Collection Time: 07/09/20  4:40 AM  Result Value Ref Range   Sodium 138 135 - 145 mmol/L   Potassium 5.1 3.5 - 5.1 mmol/L   Chloride 106 98 - 111 mmol/L   CO2 19 (L) 22 - 32 mmol/L   Glucose,  Bld 148 (H) 70 - 99 mg/dL    Comment: Glucose reference range applies only to samples taken after fasting for at least 8 hours.   BUN 83 (H) 8 - 23 mg/dL   Creatinine, Ser 4.37 (H) 0.61 - 1.24 mg/dL   Calcium 8.2 (L) 8.9 - 10.3 mg/dL   Total Protein 6.3 (L) 6.5 - 8.1 g/dL   Albumin 3.5 3.5 - 5.0 g/dL   AST 290 (H) 15 - 41 U/L   ALT 322 (H) 0 - 44 U/L   Alkaline Phosphatase 102 38 - 126 U/L   Total Bilirubin 2.5 (H) 0.3 - 1.2 mg/dL   GFR, Estimated 12 (L) >60 mL/min   Anion gap 13 5 - 15    Comment: Performed at [name], [address], [place], [place] [address]  CBG monitoring, ED     Status: Abnormal   Collection Time: 07/09/20  7:23 AM  Result Value Ref Range   Glucose-Capillary 129 (H) 70 - 99 mg/dL    Comment: Glucose reference range applies only to samples taken after fasting for at least 8 hours.  CBG monitoring, ED     Status: Abnormal   Collection Time: 07/09/20 12:11 PM  Result Value Ref Range   Glucose-Capillary 145 (H) 70 - 99 mg/dL    Comment: Glucose reference range applies only to samples taken after fasting for at least 8 hours.    DG Chest Port 1 View  Result Date: 07/08/2020 CLINICAL DATA:   Shortness of breath EXAM: PORTABLE CHEST 1 VIEW COMPARISON:  01/21/2012, 12/31/2011 FINDINGS: Post CABG changes. Stable mild cardiomegaly. There is mild pulmonary vascular congestion and prominent interstitial markings. No large pleural fluid collection. No pneumothorax. IMPRESSION: Findings suggestive of CHF with mild interstitial edema. Electronically Signed   By: Davina Poke D.O.   On: 07/08/2020 14:12   US Abdomen Limited RUQ  Result Date: 07/09/2020 CLINICAL DATA:  Elevated liver function tests. EXAM: ULTRASOUND ABDOMEN LIMITED RIGHT UPPER QUADRANT COMPARISON:  None. FINDINGS: Gallbladder: No gallstones or wall thickening visualized. No sonographic Murphy sign noted by sonographer. Common bile duct: Diameter: Normal, 6 mm. Liver: No focal lesion identified. Within normal limits in parenchymal echogenicity. Portal vein is patent on color Doppler imaging with normal direction of blood flow towards the liver. Other: Perihepatic small volume ascites. Right-sided pleural effusion. Increased echogenicity within the right kidney incidentally noted. IMPRESSION: No acute process or explanation for elevated liver function tests. Small volume ascites and right pleural effusion, suggesting fluid overload. Increased renal echogenicity, likely related to medical renal disease. Electronically Signed   By: Abigail Miyamoto M.D.   On: 07/09/2020 11:44    Assessment/Plan **acute on chronic HFrEF: rx'd lasix 40 IV BID - making some urine but not robust.  Will increase to 60 IV BID.  **AKI on CKD 4:  Presumably secondary to cardiorenal physiology though interestingly his BPs are quite high.  Will check UA and urine sodium and creatinine.  Check renal US r/o obstruction, has h/o BPH.  Avoid nephrotoxins and hypotension.  No current indications for dialysis.   **HTN:  On home meds, follow with diuresis, may need med titration. BP was 130/62 at most recent nephrology visit about 1 mo ago.  **DM: per primary; recent A1c  5.7.   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 Could be vascular congestion related with decompensated CHF.   **Anemia:  Hb 10.8  **Thrombocytopenia: plt 110, has had modest thrombocytopenia in the past; follow.  Justin Mend 07/09/2020, 3:48 PM

## 2020-07-09 NOTE — Progress Notes (Signed)
  Echocardiogram 2D Echocardiogram has been performed.  Jennette Dubin 07/09/2020, 2:33 PM

## 2020-07-09 NOTE — Plan of Care (Signed)

## 2020-07-10 LAB — MAGNESIUM: Magnesium: 2.1 mg/dL (ref 1.7–2.4)

## 2020-07-10 LAB — CBC
HCT: 30.8 % — ABNORMAL LOW (ref 39.0–52.0)
Hemoglobin: 10.3 g/dL — ABNORMAL LOW (ref 13.0–17.0)
MCH: 31.5 pg (ref 26.0–34.0)
MCHC: 33.4 g/dL (ref 30.0–36.0)
MCV: 94.2 fL (ref 80.0–100.0)
Platelets: 105 10*3/uL — ABNORMAL LOW (ref 150–400)
RBC: 3.27 MIL/uL — ABNORMAL LOW (ref 4.22–5.81)
RDW: 14.1 % (ref 11.5–15.5)
WBC: 8.5 10*3/uL (ref 4.0–10.5)
nRBC: 0.4 % — ABNORMAL HIGH (ref 0.0–0.2)

## 2020-07-10 LAB — GLUCOSE, CAPILLARY
Glucose-Capillary: 112 mg/dL — ABNORMAL HIGH (ref 70–99)
Glucose-Capillary: 192 mg/dL — ABNORMAL HIGH (ref 70–99)
Glucose-Capillary: 142 mg/dL — ABNORMAL HIGH (ref 70–99)
Glucose-Capillary: 156 mg/dL — ABNORMAL HIGH (ref 70–99)

## 2020-07-10 LAB — COMPREHENSIVE METABOLIC PANEL
ALT: 583 U/L — ABNORMAL HIGH (ref 0–44)
AST: 328 U/L — ABNORMAL HIGH (ref 15–41)
Albumin: 3.5 g/dL (ref 3.5–5.0)
Alkaline Phosphatase: 103 U/L (ref 38–126)
Anion gap: 14 (ref 5–15)
BUN: 96 mg/dL — ABNORMAL HIGH (ref 8–23)
CO2: 21 mmol/L — ABNORMAL LOW (ref 22–32)
Calcium: 8.7 mg/dL — ABNORMAL LOW (ref 8.9–10.3)
Chloride: 103 mmol/L (ref 98–111)
Creatinine, Ser: 4.65 mg/dL — ABNORMAL HIGH (ref 0.61–1.24)
GFR, Estimated: 11 mL/min — ABNORMAL LOW (ref >60)
Glucose, Bld: 114 mg/dL — ABNORMAL HIGH (ref 70–99)
Potassium: 4.1 mmol/L (ref 3.5–5.1)
Sodium: 138 mmol/L (ref 135–145)
Total Bilirubin: 2.3 mg/dL — ABNORMAL HIGH (ref 0.3–1.2)
Total Protein: 6.4 g/dL — ABNORMAL LOW (ref 6.5–8.1)

## 2020-07-10 LAB — PHOSPHORUS: Phosphorus: 4.9 mg/dL — ABNORMAL HIGH (ref 2.5–4.6)

## 2020-07-10 MED ORDER — FUROSEMIDE 10 MG/ML IJ SOLN
40.0000 mg | Freq: Two times a day (BID) | INTRAMUSCULAR | Status: DC
  Administered 2020-07-11: 40 mg via INTRAVENOUS
  Filled 2020-07-10: qty 4

## 2020-07-10 NOTE — Progress Notes (Addendum)
PROGRESS NOTE    Bradley Black  UVO:[id number] DOB: 1946-06-01 DOA: 07/08/2020 PCP: Susy Frizzle, MD    Brief Narrative:  This 74 years old male with medical history significant for ischemic cardiomyopathy (LVEF = 45%) Multivessel CAD s/p CABG in 2013, CKD stage III, type 2 diabetes, hypertension, hyperlipidemia, frequent PVCs presents in the emergency department with worsening shortness of breath associated with orthopnea and exertional shortness of breath. 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  Cardiology consulted recommended continue IV Lasix,  repeat echocardiogram. Nephrology consulted given worsening renal functions. Patient is not a good candidate for cardiac catheterization in view of contrast nephropathy , advised medical optimization.  Assessment & Plan:   Active Problems:   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   Essential hypertension   Type 2 diabetes mellitus with diabetic chronic kidney disease (HCC)   Anemia in chronic kidney disease   Chronic kidney disease, stage IV (severe) (HCC)   GERD (gastroesophageal reflux disease)   SOB (shortness of breath)   Elevated LFTs  # Acute on chronic combined heart failure Exacerbation: Patient presents with shortness of breath, orthopnea, fatigue. Chest x-ray shows mild interstitial edema, BNP 4500 Admitted to telemetry, daily weights, strict intake output charting Continue Lasix 40 mg IV twice daily. Hold Benicar for now. Repeat echocardiogram completed , reports pending. Continue to watch closely serum creatinine given stage IV kidney disease. Nephro rec: Increase lasix to 60 mg iv bid.  # CAD, S/P CABG: Continue beta-blocker and hydralazine,  Hold ACE inhibitor due to worsening renal functions Add Imdur 30 mg p.o. daily. Troponin slightly elevated could be secondary to CKD. Patient denies any chest pain palpitation dizziness.  #Type 2 diabetes  with chronic kidney disease stage IV Watch creatinine closely Sliding scale insulin. CBGs before meals and nightly.  #AKI on CKD stage IV Baseline creatinine runs between 2.4-2.6,  Presents with creatinine 3.7 >. 4.37 >. 4.65 Avoid nephrotoxic medications,  Nephrology consulted , rec renal ultrasound, Lasix 60 mg iv BID. Recheck labs tomorrow morning.  #Elevated liver enzymes: Hepatitis panel negative. Ultrasound no acute process or reasoning for elevated liver enzymes. Liver enzymes are trending up, will hold pravastatin. This could be due to congestion from heart failure.  # GERD Continue Protonix.   DVT prophylaxis: Lovenox Code Status: Full Family Communication:  No one at bed side. Disposition Plan:  Status is: Inpatient  Remains inpatient appropriate because:Inpatient level of care appropriate due to severity of illness   Dispo: The patient is from: Home              Anticipated d/c is toTBD              Anticipated d/c date is: 2 days              Patient currently is not medically stable to d/c.   Consultants:   Cardiology, Nephrology  Procedures: Echocardiogram Antimicrobials:  Anti-infectives (From admission, onward)   None      Subjective: Patient was seen and examined at bedside.  Overnight events noted.  Patient lies comfortably on bed, states he feels better, HR remains in 50s, states his HR usually runs in 50-60s Objective: Vitals:   07/09/20 1530 07/09/20 1850 07/09/20 2004 07/10/20 0328  BP: (!) 157/81 (!) 158/94 (!) 144/73 (!) 155/96  Pulse: 67 (!) 59 (!) 46 67  Resp: (!) 22 18 18 16   Temp:  98.6  F (37 C) 97.7 F (36.5 C) 98.5 F (36.9 C)  TempSrc:  Oral    SpO2: 94% 97% 97% 96%  Weight:      Height:        Intake/Output Summary (Last 24 hours) at 07/10/2020 1317 Last data filed at 07/10/2020 0908 Gross per 24 hour  Intake 243 ml  Output 1650 ml  Net -1407 ml   Filed Weights   07/08/20 1229  Weight: 72.6 kg     Examination:  General exam: Appears calm and comfortable.  Respiratory system: Clear to auscultation. Respiratory effort normal. Cardiovascular system: S1 & S2 heard, RRR. No JVD, murmurs, rubs, gallops or clicks. No pedal edema. Gastrointestinal system: Abdomen is nondistended, soft and nontender. No organomegaly or masses felt. Normal bowel sounds heard. Central nervous system: Alert and oriented. No focal neurological deficits. Extremities: No edema, no cyanosis no clubbing. Skin: No rashes, lesions or ulcers Psychiatry: Judgement and insight appear normal. Mood & affect appropriate.     Data Reviewed: I have personally reviewed following labs and imaging studies  CBC: Recent Labs  Lab 07/08/20 1329 07/10/20 0700  WBC 8.2 8.5  NEUTROABS 7.0  --   HGB 10.8* 10.3*  HCT 32.2* 30.8*  MCV 96.7 94.2  PLT 110* 673*   Basic Metabolic Panel: Recent Labs  Lab 07/08/20 1329 07/09/20 0440 07/10/20 0700  NA 138 138 138  K 5.2* 5.1 4.1  CL 107 106 103  CO2 19* 19* 21*  GLUCOSE 140* 148* 114*  BUN 75* 83* 96*  CREATININE 3.82* 4.37* 4.65*  CALCIUM 8.2* 8.2* 8.7*  MG  --   --  2.1  PHOS  --   --  4.9*   GFR: Estimated Creatinine Clearance: 13.1 mL/min (A) (by C-G formula based on SCr of 4.65 mg/dL (H)). Liver Function Tests: Recent Labs  Lab 07/08/20 1329 07/09/20 0440 07/10/20 0700  AST 165* 290* 328*  ALT 178* 322* 583*  ALKPHOS 113 102 103  BILITOT 2.6* 2.5* 2.3*  PROT 6.5 6.3* 6.4*  ALBUMIN 3.7 3.5 3.5   No results for input(s): LIPASE, AMYLASE in the last 168 hours. No results for input(s): AMMONIA in the last 168 hours. Coagulation Profile: No results for input(s): INR, PROTIME in the last 168 hours. Cardiac Enzymes: No results for input(s): CKTOTAL, CKMB, CKMBINDEX, TROPONINI in the last 168 hours. BNP (last 3 results) No results for input(s): PROBNP in the last 8760 hours. HbA1C: Recent Labs    07/08/20 1329  HGBA1C 5.7*   CBG: Recent Labs   Lab 07/09/20 1646 07/09/20 1816 07/09/20 2007 07/10/20 0745 07/10/20 1117  GLUCAP 136* 151* 154* 112* 192*   Lipid Profile: No results for input(s): CHOL, HDL, LDLCALC, TRIG, CHOLHDL, LDLDIRECT in the last 72 hours. Thyroid Function Tests: No results for input(s): TSH, T4TOTAL, FREET4, T3FREE, THYROIDAB in the last 72 hours. Anemia Panel: No results for input(s): VITAMINB12, FOLATE, FERRITIN, TIBC, IRON, RETICCTPCT in the last 72 hours. Sepsis Labs: No results for input(s): PROCALCITON, LATICACIDVEN in the last 168 hours.  Recent Results (from the past 240 hour(s))  Respiratory Panel by RT PCR (Flu A&B, Covid) - Nasopharyngeal Swab     Status: None   Collection Time: 07/08/20  1:35 PM   Specimen: Nasopharyngeal Swab  Result Value Ref Range Status   SARS Coronavirus 2 by RT PCR NEGATIVE NEGATIVE Final    Comment: (NOTE) SARS-CoV-2 target nucleic acids are NOT DETECTED.  The SARS-CoV-2 RNA is generally detectable in upper respiratoy specimens  during the acute phase of infection. The lowest concentration of SARS-CoV-2 viral copies this assay can detect is 131 copies/mL. A negative result does not preclude SARS-Cov-2 infection and should not be used as the sole basis for treatment or other patient management decisions. A negative result may occur with  improper specimen collection/handling, submission of specimen other than nasopharyngeal swab, presence of viral mutation(s) within the areas targeted by this assay, and inadequate number of viral copies (<131 copies/mL). A negative result must be combined with clinical observations, patient history, and epidemiological information. The expected result is Negative.  Fact Sheet for Patients:  PinkCheek.be  Fact Sheet for Healthcare Providers:  GravelBags.it  This test is no t yet approved or cleared by the [place] FDA and  has been authorized for detection and/or  diagnosis of SARS-CoV-2 by FDA under an Emergency Use Authorization (EUA). This EUA will remain  in effect (meaning this test can be used) for the duration of the COVID-19 declaration under Section 564(b)(1) of the Act, 21 U.S.C. section 360bbb-3(b)(1), unless the authorization is terminated or revoked sooner.     Influenza A by PCR NEGATIVE NEGATIVE Final   Influenza B by PCR NEGATIVE NEGATIVE Final    Comment: (NOTE) The Xpert Xpress SARS-CoV-2/FLU/RSV assay is intended as an aid in  the diagnosis of influenza from Nasopharyngeal swab specimens and  should not be used as a sole basis for treatment. Nasal washings and  aspirates are unacceptable for Xpert Xpress SARS-CoV-2/FLU/RSV  testing.  Fact Sheet for Patients: PinkCheek.be  Fact Sheet for Healthcare Providers: GravelBags.it  This test is not yet approved or cleared by the [place] FDA and  has been authorized for detection and/or diagnosis of SARS-CoV-2 by  FDA under an Emergency Use Authorization (EUA). This EUA will remain  in effect (meaning this test can be used) for the duration of the  Covid-19 declaration under Section 564(b)(1) of the Act, 21  U.S.C. section 360bbb-3(b)(1), unless the authorization is  terminated or revoked. Performed at [facility], [address], [place], [place] [address]   MRSA PCR Screening     Status: None   Collection Time: 07/09/20  8:45 PM   Specimen: Nasal Mucosa; Nasopharyngeal  Result Value Ref Range Status   MRSA by PCR NEGATIVE NEGATIVE Final    Comment:        The GeneXpert MRSA Assay (FDA approved for NASAL specimens only), is one component of a comprehensive MRSA colonization surveillance program. It is not intended to diagnose MRSA infection nor to guide or monitor treatment for MRSA infections. Performed at [facility], [address], [place], [place] [address]          Radiology Studies: [facility] Chest  [facility] 1 View  Result Date: 07/08/2020 CLINICAL DATA:  Shortness of breath EXAM: PORTABLE CHEST 1 VIEW COMPARISON:  01/21/2012, 12/31/2011 FINDINGS: Post CABG changes. Stable mild cardiomegaly. There is mild pulmonary vascular congestion and prominent interstitial markings. No large pleural fluid collection. No pneumothorax. IMPRESSION: Findings suggestive of CHF with mild interstitial edema. Electronically Signed   By: Davina Poke D.O.   On: 07/08/2020 14:12   US Abdomen Limited RUQ  Result Date: 07/09/2020 CLINICAL DATA:  Elevated liver function tests. EXAM: ULTRASOUND ABDOMEN LIMITED RIGHT UPPER QUADRANT COMPARISON:  None. FINDINGS: Gallbladder: No gallstones or wall thickening visualized. No sonographic Murphy sign noted by sonographer. Common bile duct: Diameter: Normal, 6 mm. Liver: No focal lesion identified. Within normal limits in parenchymal echogenicity. Portal vein is patent  on color Doppler imaging with normal direction of blood flow towards the liver. Other: Perihepatic small volume ascites. Right-sided pleural effusion. Increased echogenicity within the right kidney incidentally noted. IMPRESSION: No acute process or explanation for elevated liver function tests. Small volume ascites and right pleural effusion, suggesting fluid overload. Increased renal echogenicity, likely related to medical renal disease. Electronically Signed   By: Abigail Miyamoto M.D.   On: 07/09/2020 11:44   Scheduled Meds: . aspirin EC  81 mg Oral Daily  . [START ON 07/11/2020] calcitRIOL  0.25 mcg Oral Q M,W,F  . citalopram  10 mg Oral Daily  . doxazosin  8 mg Oral Daily  . enoxaparin (LOVENOX) injection  30 mg Subcutaneous Q24H  . furosemide  60 mg Intravenous Q12H  . hydrALAZINE  50 mg Oral TID  . insulin aspart  0-15 Units Subcutaneous TID WC  . insulin aspart  0-5 Units Subcutaneous QHS  . isosorbide mononitrate  30 mg Oral Daily  . metoprolol tartrate  25 mg Oral BID  . pantoprazole  40 mg Oral BID  .  sodium bicarbonate  1,950 mg Oral Daily  . sodium chloride flush  3 mL Intravenous Q12H   Continuous Infusions: . sodium chloride       LOS: 2 days    Time spent: 25 mins.    Shawna Clamp, MD [facility]   If 7PM-7AM, please contact night-coverage

## 2020-07-10 NOTE — Progress Notes (Signed)
[facility] Progress Note   Subjective:   Feels breathing some improved, no new symptoms.  Abd feels less distended.  I/Os net neg 2.3 for admission.    Objective Vitals:   07/09/20 1530 07/09/20 1850 07/09/20 2004 07/10/20 0328  BP: (!) 157/81 (!) 158/94 (!) 144/73 (!) 155/96  Pulse: 67 (!) 59 (!) 46 67  Resp: (!) 22 18 18 16   Temp:  98.6 F (37 C) 97.7 F (36.5 C) 98.5 F (36.9 C)  TempSrc:  Oral    SpO2: 94% 97% 97% 96%  Weight:      Height:       Physical Exam General: looks less fatigued than yesterday Heart: RRR no rub Lungs: basilar rales L > R Abdomen: soft, mildly distended, no frank fluid wave Extremities: no edema  Additional Objective Labs: Basic Metabolic Panel: Recent Labs  Lab 07/08/20 1329 07/09/20 0440 07/10/20 0700  NA 138 138 138  K 5.2* 5.1 4.1  CL 107 106 103  CO2 19* 19* 21*  GLUCOSE 140* 148* 114*  BUN 75* 83* 96*  CREATININE 3.82* 4.37* 4.65*  CALCIUM 8.2* 8.2* 8.7*  PHOS  --   --  4.9*   Liver Function Tests: Recent Labs  Lab 07/08/20 1329 07/09/20 0440 07/10/20 0700  AST 165* 290* 328*  ALT 178* 322* 583*  ALKPHOS 113 102 103  BILITOT 2.6* 2.5* 2.3*  PROT 6.5 6.3* 6.4*  ALBUMIN 3.7 3.5 3.5   No results for input(s): LIPASE, AMYLASE in the last 168 hours. CBC: Recent Labs  Lab 07/08/20 1329 07/10/20 0700  WBC 8.2 8.5  NEUTROABS 7.0  --   HGB 10.8* 10.3*  HCT 32.2* 30.8*  MCV 96.7 94.2  PLT 110* 105*   Blood Culture    Component Value Date/Time   SDES TRACHEAL ASPIRATE 12/28/2011 1644   SPECREQUEST NONE 12/28/2011 1644   CULT MODERATE STREPTOCOCCUS,BETA HEMOLYIC NOT GROUP A 12/28/2011 1644   REPTSTATUS 12/31/2011 FINAL 12/28/2011 1644    Cardiac Enzymes: No results for input(s): CKTOTAL, CKMB, CKMBINDEX, TROPONINI in the last 168 hours. CBG: Recent Labs  Lab 07/09/20 1646 07/09/20 1816 07/09/20 2007 07/10/20 0745 07/10/20 1117  GLUCAP 136* 151* 154* 112* 192*   Iron Studies: No results  for input(s): IRON, TIBC, TRANSFERRIN, FERRITIN in the last 72 hours. @lablastinr3 @ Studies/Results: DG Chest Port 1 View  Result Date: 07/08/2020 CLINICAL DATA:  Shortness of breath EXAM: PORTABLE CHEST 1 VIEW COMPARISON:  01/21/2012, 12/31/2011 FINDINGS: Post CABG changes. Stable mild cardiomegaly. There is mild pulmonary vascular congestion and prominent interstitial markings. No large pleural fluid collection. No pneumothorax. IMPRESSION: Findings suggestive of CHF with mild interstitial edema. Electronically Signed   By: Davina Poke D.O.   On: 07/08/2020 14:12   US Abdomen Limited RUQ  Result Date: 07/09/2020 CLINICAL DATA:  Elevated liver function tests. EXAM: ULTRASOUND ABDOMEN LIMITED RIGHT UPPER QUADRANT COMPARISON:  None. FINDINGS: Gallbladder: No gallstones or wall thickening visualized. No sonographic Murphy sign noted by sonographer. Common bile duct: Diameter: Normal, 6 mm. Liver: No focal lesion identified. Within normal limits in parenchymal echogenicity. Portal vein is patent on color Doppler imaging with normal direction of blood flow towards the liver. Other: Perihepatic small volume ascites. Right-sided pleural effusion. Increased echogenicity within the right kidney incidentally noted. IMPRESSION: No acute process or explanation for elevated liver function tests. Small volume ascites and right pleural effusion, suggesting fluid overload. Increased renal echogenicity, likely related to medical renal disease. Electronically Signed   By: Marylyn Ishihara  Jobe Igo M.D.   On: 07/09/2020 11:44   Medications: . sodium chloride     . aspirin EC  81 mg Oral Daily  . [START ON 07/11/2020] calcitRIOL  0.25 mcg Oral Q M,W,F  . citalopram  10 mg Oral Daily  . doxazosin  8 mg Oral Daily  . enoxaparin (LOVENOX) injection  30 mg Subcutaneous Q24H  . furosemide  60 mg Intravenous Q12H  . hydrALAZINE  50 mg Oral TID  . insulin aspart  0-15 Units Subcutaneous TID WC  . insulin aspart  0-5 Units  Subcutaneous QHS  . isosorbide mononitrate  30 mg Oral Daily  . metoprolol tartrate  25 mg Oral BID  . pantoprazole  40 mg Oral BID  . sodium bicarbonate  1,950 mg Oral Daily  . sodium chloride flush  3 mL Intravenous Q12H    Assessment/Plan **acute on chronic HFrEF: net neg for the admission though still with some symptoms and rales, but in light of uptrending BUN/Cr will hold PM dose of diuretic and resume tomorrow at lower dose.  **AKI on CKD 4:  CKD 4 with baseline 3.1 (follows with Dr. Theador Hawthorne, last visit 05/2020)  Acutely presumably secondary to cardiorenal physiology.  UA bland, FeNa/urea not helpful with nonoliguric.  Check renal US r/o obstruction, has h/o BPH.  Avoid nephrotoxins and hypotension.  No current indications for dialysis.  Diuretics per above.  **HTN:  On home meds > was hypertensive initially but now improved after diuresis. BP was 130/62 at most recent nephrology visit about 1 mo ago.  **DM: per primary; recent A1c 5.7.   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 Could be vascular congestion related with decompensated CHF.   **Anemia:  Hb 10.3  **Thrombocytopenia: plt 105, has had modest thrombocytopenia in the past; follow.    Jannifer Hick MD 07/10/2020, 1:23 PM  [facility] Pager: [phone]

## 2020-07-11 ENCOUNTER — Other Ambulatory Visit: Payer: Self-pay

## 2020-07-11 ENCOUNTER — Inpatient Hospital Stay (HOSPITAL_COMMUNITY): Payer: Medicare Other

## 2020-07-11 ENCOUNTER — Other Ambulatory Visit: Payer: Self-pay | Admitting: *Deleted

## 2020-07-11 DIAGNOSIS — I10 Essential (primary) hypertension: Secondary | ICD-10-CM | POA: Diagnosis not present

## 2020-07-11 DIAGNOSIS — I5043 Acute on chronic combined systolic (congestive) and diastolic (congestive) heart failure: Secondary | ICD-10-CM | POA: Diagnosis not present

## 2020-07-11 DIAGNOSIS — N1832 Chronic kidney disease, stage 3b: Secondary | ICD-10-CM | POA: Diagnosis not present

## 2020-07-11 DIAGNOSIS — E11319 Type 2 diabetes mellitus with unspecified diabetic retinopathy without macular edema: Secondary | ICD-10-CM | POA: Diagnosis not present

## 2020-07-11 LAB — COMPREHENSIVE METABOLIC PANEL
ALT: 465 U/L — ABNORMAL HIGH (ref 0–44)
AST: 135 U/L — ABNORMAL HIGH (ref 15–41)
Albumin: 3.3 g/dL — ABNORMAL LOW (ref 3.5–5.0)
Alkaline Phosphatase: 89 U/L (ref 38–126)
Anion gap: 14 (ref 5–15)
BUN: 93 mg/dL — ABNORMAL HIGH (ref 8–23)
CO2: 23 mmol/L (ref 22–32)
Calcium: 8.6 mg/dL — ABNORMAL LOW (ref 8.9–10.3)
Chloride: 101 mmol/L (ref 98–111)
Creatinine, Ser: 4.48 mg/dL — ABNORMAL HIGH (ref 0.61–1.24)
GFR, Estimated: 12 mL/min — ABNORMAL LOW (ref >60)
Glucose, Bld: 97 mg/dL (ref 70–99)
Potassium: 3.7 mmol/L (ref 3.5–5.1)
Sodium: 138 mmol/L (ref 135–145)
Total Bilirubin: 2.1 mg/dL — ABNORMAL HIGH (ref 0.3–1.2)
Total Protein: 6.2 g/dL — ABNORMAL LOW (ref 6.5–8.1)

## 2020-07-11 LAB — ECHOCARDIOGRAM COMPLETE
Area-P 1/2: 3.48 cm2
Height: 68 in
MV M vel: 4.89 m/s
MV Peak grad: 95.6 mmHg
S' Lateral: 5.12 cm
Weight: 2560 oz

## 2020-07-11 LAB — CBC
HCT: 30.9 % — ABNORMAL LOW (ref 39.0–52.0)
Hemoglobin: 10.3 g/dL — ABNORMAL LOW (ref 13.0–17.0)
MCH: 31.6 pg (ref 26.0–34.0)
MCHC: 33.3 g/dL (ref 30.0–36.0)
MCV: 94.8 fL (ref 80.0–100.0)
Platelets: 107 10*3/uL — ABNORMAL LOW (ref 150–400)
RBC: 3.26 MIL/uL — ABNORMAL LOW (ref 4.22–5.81)
RDW: 14.3 % (ref 11.5–15.5)
WBC: 7.5 10*3/uL (ref 4.0–10.5)
nRBC: 0 % (ref 0.0–0.2)

## 2020-07-11 LAB — MAGNESIUM: Magnesium: 2.1 mg/dL (ref 1.7–2.4)

## 2020-07-11 LAB — GLUCOSE, CAPILLARY
Glucose-Capillary: 90 mg/dL (ref 70–99)
Glucose-Capillary: 153 mg/dL — ABNORMAL HIGH (ref 70–99)
Glucose-Capillary: 132 mg/dL — ABNORMAL HIGH (ref 70–99)
Glucose-Capillary: 122 mg/dL — ABNORMAL HIGH (ref 70–99)

## 2020-07-11 LAB — PHOSPHORUS: Phosphorus: 4.5 mg/dL (ref 2.5–4.6)

## 2020-07-11 LAB — UREA NITROGEN, URINE: Urea Nitrogen, Ur: 236 mg/dL

## 2020-07-11 MED ORDER — SODIUM CHLORIDE 0.9 % IV SOLN: 250.0000 mL | INTRAVENOUS | Status: DC | PRN

## 2020-07-11 MED ORDER — FUROSEMIDE 10 MG/ML IJ SOLN: 40.0000 mg | Freq: Every day | INTRAMUSCULAR | Status: DC

## 2020-07-11 MED ORDER — ASPIRIN 81 MG PO CHEW
81.0000 mg | CHEWABLE_TABLET | ORAL | Status: AC
  Administered 2020-07-12: 81 mg via ORAL
  Filled 2020-07-11: qty 1

## 2020-07-11 MED ORDER — SODIUM CHLORIDE 0.9 % IV SOLN: INTRAVENOUS | Status: DC

## 2020-07-11 MED ORDER — ASPIRIN EC 81 MG PO TBEC
81.0000 mg | DELAYED_RELEASE_TABLET | Freq: Every day | ORAL | Status: DC
  Administered 2020-07-13: 81 mg via ORAL
  Filled 2020-07-11: qty 1

## 2020-07-11 MED ORDER — SODIUM CHLORIDE 0.9% FLUSH: 3.0000 mL | INTRAVENOUS | Status: DC | PRN

## 2020-07-11 MED ORDER — SODIUM CHLORIDE 0.9% FLUSH
3.0000 mL | Freq: Two times a day (BID) | INTRAVENOUS | Status: DC
  Administered 2020-07-11 – 2020-07-12 (×2): 3 mL via INTRAVENOUS

## 2020-07-11 NOTE — Progress Notes (Signed)
Spoke with patient and wife  Discussed ventricular ectopy and new biventricular CHF    Would recomm R and L heart cath to r/o ischemia as driving force for both. Discussed with J Coladonato.   Recomm hydration starting at 6 AM   Pt close to dialysis   Pt and wife aware of risks and agree to proceed.   Plan to tx to [facility] tomorrow  Dorris Carnes MD

## 2020-07-11 NOTE — H&P (Addendum)
Prior coronary bypass grafting Biventricular failure on echo Frequent PVCs Left and right heart cath to discern etiology of reduced LV function and RV function. Has CKD.

## 2020-07-11 NOTE — Patient Outreach (Signed)
[facility]) Care Management  07/11/2020  Bradley Black 07/03/1946 [id number]   RN Health Coach Hospitalization  Referral Date: 11/10/2019 Referral Source: Transfer from [facility] Reason for Referral: Continued Disease Management Education Insurance: [employer]   Outreach Attempt:  Patient admitted to [facility] for heart failure and renal failure.  RN Health Coach notified [name] Liaison of patients admission.  Plan:  Will await [facility] Liaisons recommendations of discharge follow up.  [place] Coach [phone] [name]

## 2020-07-11 NOTE — TOC Initial Note (Signed)
Transition of Care [facility]) - Initial/Assessment Note    Patient Details  Name: Bradley Black MRN: [id number] Date of Birth: 1946/01/04  Transition of Care [facility]) CM/SW Contact:    Natasha Bence, LCSW Phone Number: 07/11/2020, 5:05 PM  Clinical Narrative:                 Patient is a 74 year old male admitted for CAD in native artery. CSW identified readmission risk. CSW conducted [facility] assessment. Patient's wife reported that patient is not agreeable to SNF, but would be agreeable to [facility]. Patient reported he uses a walker at home and has a ramp to enter home. Patient's wife expressed concern about patient's fluid build up and had reported that they have been following up with the cardiologist to inquire about further treatment. TOC to follow.  Expected Discharge Plan: [place] Barriers to Discharge: Continued Medical Work up   Patient Goals and CMS Choice Patient states their goals for this hospitalization and ongoing recovery are:: Return Home with [facility]   Choice offered to / list presented to : Patient, Spouse  Expected Discharge Plan and Services Expected Discharge Plan: [place]       Living arrangements for the past 2 months: Single Family Home                                      Prior Living Arrangements/Services Living arrangements for the past 2 months: Single Family Home Lives with:: Self, Spouse Patient language and need for interpreter reviewed:: Yes        Need for Family Participation in Patient Care: Yes (Comment) Care giver support system in place?: Yes (comment) Current home services: Other (comment) [facility]) Criminal Activity/Legal Involvement Pertinent to Current Situation/Hospitalization: No - Comment as needed  Activities of Daily Living Home Assistive Devices/Equipment: CBG Meter ADL Screening (condition at time of admission) Patient's cognitive ability adequate to safely complete daily activities?: Yes Is the  patient deaf or have difficulty hearing?: No Does the patient have difficulty seeing, even when wearing glasses/contacts?: No Does the patient have difficulty concentrating, remembering, or making decisions?: No Patient able to express need for assistance with ADLs?: Yes Does the patient have difficulty dressing or bathing?: No Independently performs ADLs?: Yes (appropriate for developmental age) Does the patient have difficulty walking or climbing stairs?: No Weakness of Legs: None Weakness of Arms/Hands: None  Permission Sought/Granted      Share Information with NAME: Alfio Loescher  Permission granted to share info w AGENCY: [place]  Permission granted to share info w Relationship: Spouse  Permission granted to share info w Contact Information: [phone]  Emotional Assessment Appearance:: Appears stated age   Affect (typically observed): Accepting, Adaptable, Appropriate Orientation: : Oriented to Self, Oriented to Situation, Oriented to Place, Oriented to  Time Alcohol / Substance Use: Not Applicable Psych Involvement: No (comment)  Admission diagnosis:  SOB (shortness of breath) [R06.02] Elevated LFTs [T41.96] Systolic congestive heart failure, unspecified HF chronicity ([place]) [I50.20] Patient Active Problem List   Diagnosis Date Noted  . SOB (shortness of breath) 07/08/2020  . Elevated LFTs 07/08/2020  . Left carotid artery stenosis   . IDA (iron deficiency anemia) 04/13/2020  . GERD (gastroesophageal reflux disease)   . Essential hypertension 07/24/2019  . Anemia in chronic kidney disease 07/24/2019  . Chronic kidney disease, stage IV (severe) ([place]) 07/24/2019  .  Hyperkalemia 07/24/2019  . 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 Type 2 diabetes mellitus with diabetic chronic kidney disease ([place]) 07/19/2016  . Cervicalgia 03/08/2014  . Whiplash injury to neck 03/08/2014  . Aortic heart murmur 10/18/2013  . Acute myocardial  infarction, History of:   [name] Hyperlipidemia LDL goal <70   . Anemia 04/23/2013  . 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 Ischemic cardiomyopathy 11/30/2011  . Actinic keratosis, left scalp 10/04/2011  . Seborrheic keratosis, right lateral leg 10/04/2011   PCP:  Susy Frizzle, MD Pharmacy:   [facility], [address] [place] [address] Phone: [phone] Fax: [place], [address] [name] [address] Phone: [phone] Fax: [place], [place] [name] [address] Phone: [phone] Fax: [phone]     Social Determinants of Health (SDOH) Interventions    Readmission Risk Interventions No flowsheet data found.

## 2020-07-11 NOTE — Progress Notes (Addendum)
Progress Note  Patient Name: [place] Date of Encounter: 07/11/2020  [facility] Cardiologist: Former pt Dr Bronson Ing EP:  Allred  Subjective   PT sleepy   Says his breathing is OK   Denies CP    Inpatient Medications    Scheduled Meds: . aspirin EC  81 mg Oral Daily  . calcitRIOL  0.25 mcg Oral Q M,W,F  . citalopram  10 mg Oral Daily  . doxazosin  8 mg Oral Daily  . enoxaparin (LOVENOX) injection  30 mg Subcutaneous Q24H  . [START ON 07/12/2020] furosemide  40 mg Intravenous Daily  . hydrALAZINE  50 mg Oral TID  . insulin aspart  0-15 Units Subcutaneous TID WC  . insulin aspart  0-5 Units Subcutaneous QHS  . isosorbide mononitrate  30 mg Oral Daily  . metoprolol tartrate  25 mg Oral BID  . pantoprazole  40 mg Oral BID  . sodium bicarbonate  1,950 mg Oral Daily  . sodium chloride flush  3 mL Intravenous Q12H   Continuous Infusions: . sodium chloride     PRN Meds: sodium chloride, acetaminophen, ondansetron (ZOFRAN) IV, sodium chloride flush   Vital Signs    Vitals:   07/10/20 1500 07/10/20 2028 07/11/20 0404 07/11/20 0458  BP: (!) 147/74 123/65 (!) 148/107   Pulse: (!) 50 (!) 52 (!) 109   Resp: 18 15 17    Temp: 98 F (36.7 C) 99.3 F (37.4 C) 98.6 F (37 C)   TempSrc: Oral     SpO2: 98% 97% 93%   Weight:    67.9 kg  Height:        Intake/Output Summary (Last 24 hours) at 07/11/2020 1343 Last data filed at 07/11/2020 0801 Gross per 24 hour  Intake --  Output 1150 ml  Net -1150 ml   Last 3 Weights 07/11/2020 07/08/2020 06/29/2020  Weight (lbs) 149 lb 11.1 oz 160 lb 158 lb  Weight (kg) 67.9 kg 72.576 kg 71.668 kg      Telemetry    SR with frequent PVCs, triplets - Personally Reviewed  ECG    No new  - Personally Reviewed  Physical Exam   GEN: No acute distress.   Neck: No JVD Cardiac: RRR with frequent skips  S1, S2  No S3  No signif murmurs   Respiratory: Clear to auscultation bilaterally. GI: Soft, nontender, non-distended  MS:  No edema; No deformity. Neuro:  Nonfocal  Psych: Normal affect   Labs    High Sensitivity Troponin:   Recent Labs  Lab 07/08/20 1329 07/08/20 1545  TROPONINIHS 36* 34*      Chemistry Recent Labs  Lab 07/09/20 0440 07/10/20 0700 07/11/20 0818  NA 138 138 138  K 5.1 4.1 3.7  CL 106 103 101  CO2 19* 21* 23  GLUCOSE 148* 114* 97  BUN 83* 96* 93*  CREATININE 4.37* 4.65* 4.48*  CALCIUM 8.2* 8.7* 8.6*  PROT 6.3* 6.4* 6.2*  ALBUMIN 3.5 3.5 3.3*  AST 290* 328* 135*  ALT 322* 583* 465*  ALKPHOS 102 103 89  BILITOT 2.5* 2.3* 2.1*  GFRNONAA 12* 11* 12*  ANIONGAP 13 14 14      Hematology Recent Labs  Lab 07/08/20 1329 07/10/20 0700 07/11/20 0818  WBC 8.2 8.5 7.5  RBC 3.33* 3.27* 3.26*  HGB 10.8* 10.3* 10.3*  HCT 32.2* 30.8* 30.9*  MCV 96.7 94.2 94.8  MCH 32.4 31.5 31.6  MCHC 33.5 33.4 33.3  RDW 14.2 14.1 14.3  PLT 110* 105*  107*    BNP Recent Labs  Lab 07/08/20 1329  BNP >4,500.0*     DDimer No results for input(s): DDIMER in the last 168 hours.   Radiology    US RENAL  Result Date: 07/11/2020 CLINICAL DATA:  Acute kidney injury. EXAM: RENAL / URINARY TRACT ULTRASOUND COMPLETE COMPARISON:  03/14/2017. FINDINGS: Right Kidney: Renal measurements: 8.5 x 4.6 x 3.5 cm = volume: 72 mL. Diffusely increased echogenicity without hydronephrosis. Left Kidney: Renal measurements: 9.8 x 5.4 x 4.5 cm = volume: 126 mL. Echogenic parenchyma with 2.9 cm cystic lesion interpolar region not substantially changed from 2.6 cm on ultrasound exam of 03/14/2017. Bladder: Appears normal for degree of bladder distention. Other: None. IMPRESSION: 1. No acute findings. No hydronephrosis. 2. 2.9 cm cyst interpolar region left kidney not substantially changed since 2018. 3. Increased echogenicity of both kidneys compatible with medical renal disease. Electronically Signed   By: Misty Stanley M.D.   On: 07/11/2020 10:53   ECHOCARDIOGRAM COMPLETE  Result Date: 07/11/2020    ECHOCARDIOGRAM  REPORT   Patient Name:   [place] Date of Exam: 07/09/2020 Medical Rec #:  [id number]     Height:       68.0 in Accession #:    [id number]    Weight:       160.0 lb Date of Birth:  [birth year]     BSA:          1.859 m Patient Age:    74 years      BP:           172/96 mmHg Patient Gender: M             HR:           61 bpm. Exam Location:  [facility] Procedure: 2D Echo Indications:    CHF-Acute Systolic  History:        Patient has prior history of Echocardiogram examinations, most                 recent 10/27/2018. CAD, Prior CABG; Risk Factors:Hypertension,                 Diabetes and Dyslipidemia.  Sonographer:    Mikki Santee RDCS (AE) Referring Phys: [place]  1. Compared to previous echo, LVEF and RVEF are worse . Left ventricular ejection fraction, by estimation, is 25%%. The left ventricle has severely decreased function. The left ventricle demonstrates global hypokinesis. The left ventricular internal cavity size was moderately dilated. Left ventricular diastolic parameters are consistent with Grade III diastolic dysfunction (restrictive). Elevated left atrial pressure.  2. Right ventricular systolic function is severely reduced. The right ventricular size is mildly enlarged. There is moderately elevated pulmonary artery systolic pressure.  3. Left atrial size was severely dilated.  4. Right atrial size was moderately dilated.  5. The mitral valve is normal in structure. Mild to moderate mitral valve regurgitation.  6. The aortic valve is tricuspid. Aortic valve regurgitation is trivial.  7. The inferior vena cava is dilated in size with <50% respiratory variability, suggesting right atrial pressure of 15 mmHg. FINDINGS  Left Ventricle: Compared to previous echo, LVEF and RVEF are worse. Left ventricular ejection fraction, by estimation, is 25%%. The left ventricle has severely decreased function. The left ventricle demonstrates global hypokinesis. The left ventricular  internal cavity size was moderately dilated. There is no left ventricular hypertrophy. Left ventricular diastolic parameters are consistent with Grade III diastolic dysfunction (restrictive).  Elevated left atrial pressure. Right Ventricle: The right ventricular size is mildly enlarged. No increase in right ventricular wall thickness. Right ventricular systolic function is severely reduced. There is moderately elevated pulmonary artery systolic pressure. The tricuspid regurgitant velocity is 3.01 m/s, and with an assumed right atrial pressure of 15 mmHg, the estimated right ventricular systolic pressure is 32.2 mmHg. Left Atrium: Left atrial size was severely dilated. Right Atrium: Right atrial size was moderately dilated. Pericardium: There is no evidence of pericardial effusion. Mitral Valve: The mitral valve is normal in structure. Mild to moderate mitral valve regurgitation. Tricuspid Valve: The tricuspid valve is normal in structure. Tricuspid valve regurgitation is mild. Aortic Valve: The aortic valve is tricuspid. Aortic valve regurgitation is trivial. Pulmonic Valve: The pulmonic valve was not well visualized. Pulmonic valve regurgitation is not visualized. No evidence of pulmonic stenosis. Aorta: The aortic root is normal in size and structure. Venous: The inferior vena cava is dilated in size with less than 50% respiratory variability, suggesting right atrial pressure of 15 mmHg. IAS/Shunts: The interatrial septum was not assessed.  LEFT VENTRICLE PLAX 2D LVIDd:         5.69 cm  Diastology LVIDs:         5.12 cm  LV e' medial:    3.00 cm/s LV PW:         1.03 cm  LV E/e' medial:  25.1 LV IVS:        1.02 cm  LV e' lateral:   5.35 cm/s LVOT diam:     2.10 cm  LV E/e' lateral: 14.1 LVOT Area:     3.46 cm  RIGHT VENTRICLE RV S prime:     4.50 cm/s TAPSE (M-mode): 0.6 cm LEFT ATRIUM            Index       RIGHT ATRIUM           Index LA diam:      5.10 cm  2.74 cm/m  RA Area:     23.10 cm LA Vol (A4C): 105.0  ml 56.49 ml/m RA Volume:   69.30 ml  37.28 ml/m   AORTA Ao Root diam: 3.40 cm MITRAL VALVE               TRICUSPID VALVE MV Area (PHT): 3.48 cm    TR Peak grad:   36.2 mmHg MV Decel Time: 218 msec    TR Vmax:        301.00 cm/s MR Peak grad: 95.6 mmHg MR Mean grad: 61.0 mmHg    SHUNTS MR Vmax:      489.00 cm/s  Systemic Diam: 2.10 cm MR Vmean:     370.0 cm/s MV E velocity: 75.30 cm/s MV A velocity: 45.90 cm/s MV E/A ratio:  1.64 Dorris Carnes MD Electronically signed by Dorris Carnes MD Signature Date/Time: 07/11/2020/11:23:09 AM    Final     Cardiac Studies   See above   Patient Profile     74 y.o. male with ischemic CM and LVEF 45% in past   S/p CABG in 2013   Also hx of CKD Stage III, DM Type II, HTN, HL and PVCs  Admitted with increased SOB   Assessment & Plan    1  Acute on chronic systolic CHF Pt has diuresed significantly since admit   Volume status is not bad   I have reviewed echo that was done Sat (10/9)   LVEF and RVEF are depressed   Compared  to echo from 2020 this is a signif change   He has signif PVCs which may be precipitating   But he may have ischemia that is precipitating both PVCs and LV / RV decline     I have reviewed with EP Would recomm R and L heart cath to redefine anatomy. Further Rx based on findings    Renal function precludes ACEI / ARNI/aldactone   On hydralatzine and NTG   Hold titration for now       2  CAD   As noted above   WIll need LHC with minimal contrast  RHC to eval pressures   I have discussed with renal service   Understand risk of dialysis but if not defined risk further cardiac decompensation   Need to review with wife (at funeral)  Husband is sleepy  3 Rhythm  Pt with frequent PVCs/ triplets  Recnet monitor sugg 17% total    I have reviewed with EP (Allred)   Would recomm LHC to r/o ischemia as driving force for ectopy HR limits Rx for now   4   HTN   Continue meds for now    4  Renal  BUN/ CR continue to increase   Diuretics on hold     5  HL   Keep on statin     Would organize tx to [facility]  Once reviewed with pt  / family    Dorris Carnes MD     For questions or updates, please contact [place] Please consult [email or web address] for contact info under        Signed, Dorris Carnes, MD  07/11/2020, 1:43 PM

## 2020-07-11 NOTE — Progress Notes (Signed)
Patient ID: Gianny NICKOLIS DIEL, male   DOB: [birth year], 74 y.o.   MRN: [id number] S: Feels well, no complaints of SOB. O:BP (!) 148/107 (BP Location: Right Arm)   Pulse (!) 109   Temp 98.6 F (37 C)   Resp 17   Ht 5\' 8"  (1.727 m)   Wt 67.9 kg   SpO2 93%   BMI 22.76 kg/m   Intake/Output Summary (Last 24 hours) at 07/11/2020 0835 Last data filed at 07/11/2020 0600 Gross per 24 hour  Intake 480 ml  Output 1050 ml  Net -570 ml   Intake/Output: I/O last 3 completed shifts: In: 483 [P.O.:480; I.V.:3] Out: 2250 [Urine:2250]  Intake/Output this shift:  No intake/output data recorded. Weight change:  Gen: NAD CVS: tachy at 109 Resp: cta Abd: +BS, soft, NT/ND Ext: no edema  Recent Labs  Lab 07/08/20 1329 07/09/20 0440 07/10/20 0700  NA 138 138 138  K 5.2* 5.1 4.1  CL 107 106 103  CO2 19* 19* 21*  GLUCOSE 140* 148* 114*  BUN 75* 83* 96*  CREATININE 3.82* 4.37* 4.65*  ALBUMIN 3.7 3.5 3.5  CALCIUM 8.2* 8.2* 8.7*  PHOS  --   --  4.9*  AST 165* 290* 328*  ALT 178* 322* 583*   Liver Function Tests: Recent Labs  Lab 07/08/20 1329 07/09/20 0440 07/10/20 0700  AST 165* 290* 328*  ALT 178* 322* 583*  ALKPHOS 113 102 103  BILITOT 2.6* 2.5* 2.3*  PROT 6.5 6.3* 6.4*  ALBUMIN 3.7 3.5 3.5   No results for input(s): LIPASE, AMYLASE in the last 168 hours. No results for input(s): AMMONIA in the last 168 hours. CBC: Recent Labs  Lab 07/08/20 1329 07/10/20 0700  WBC 8.2 8.5  NEUTROABS 7.0  --   HGB 10.8* 10.3*  HCT 32.2* 30.8*  MCV 96.7 94.2  PLT 110* 105*   Cardiac Enzymes: No results for input(s): CKTOTAL, CKMB, CKMBINDEX, TROPONINI in the last 168 hours. CBG: Recent Labs  Lab 07/10/20 0745 07/10/20 1117 07/10/20 1618 07/10/20 2029 07/11/20 0737  GLUCAP 112* 192* 142* 156* 90    Iron Studies: No results for input(s): IRON, TIBC, TRANSFERRIN, FERRITIN in the last 72 hours. Studies/Results: US Abdomen Limited RUQ  Result Date: 07/09/2020 CLINICAL DATA:   Elevated liver function tests. EXAM: ULTRASOUND ABDOMEN LIMITED RIGHT UPPER QUADRANT COMPARISON:  None. FINDINGS: Gallbladder: No gallstones or wall thickening visualized. No sonographic Murphy sign noted by sonographer. Common bile duct: Diameter: Normal, 6 mm. Liver: No focal lesion identified. Within normal limits in parenchymal echogenicity. Portal vein is patent on color Doppler imaging with normal direction of blood flow towards the liver. Other: Perihepatic small volume ascites. Right-sided pleural effusion. Increased echogenicity within the right kidney incidentally noted. IMPRESSION: No acute process or explanation for elevated liver function tests. Small volume ascites and right pleural effusion, suggesting fluid overload. Increased renal echogenicity, likely related to medical renal disease. Electronically Signed   By: Abigail Miyamoto M.D.   On: 07/09/2020 11:44   . aspirin EC  81 mg Oral Daily  . calcitRIOL  0.25 mcg Oral Q M,W,F  . citalopram  10 mg Oral Daily  . doxazosin  8 mg Oral Daily  . enoxaparin (LOVENOX) injection  30 mg Subcutaneous Q24H  . furosemide  40 mg Intravenous BID  . hydrALAZINE  50 mg Oral TID  . insulin aspart  0-15 Units Subcutaneous TID WC  . insulin aspart  0-5 Units Subcutaneous QHS  . isosorbide mononitrate  30 mg  Oral Daily  . metoprolol tartrate  25 mg Oral BID  . pantoprazole  40 mg Oral BID  . sodium bicarbonate  1,950 mg Oral Daily  . sodium chloride flush  3 mL Intravenous Q12H    BMET    Component Value Date/Time   NA 138 07/10/2020 0700   K 4.1 07/10/2020 0700   CL 103 07/10/2020 0700   CO2 21 (L) 07/10/2020 0700   GLUCOSE 114 (H) 07/10/2020 0700   BUN 96 (H) 07/10/2020 0700   CREATININE 4.65 (H) 07/10/2020 0700   CREATININE 2.84 (H) 05/01/2019 0945   CALCIUM 8.7 (L) 07/10/2020 0700   GFRNONAA 11 (L) 07/10/2020 0700   GFRNONAA 21 (L) 05/01/2019 0945   GFRAA 24 (L) 05/01/2019 0945   CBC    Component Value Date/Time   WBC 8.5 07/10/2020  0700   RBC 3.27 (L) 07/10/2020 0700   HGB 10.3 (L) 07/10/2020 0700   HCT 30.8 (L) 07/10/2020 0700   PLT 105 (L) 07/10/2020 0700   MCV 94.2 07/10/2020 0700   MCH 31.5 07/10/2020 0700   MCHC 33.4 07/10/2020 0700   RDW 14.1 07/10/2020 0700   LYMPHSABS 0.6 (L) 07/08/2020 1329   MONOABS 0.6 07/08/2020 1329   EOSABS 0.0 07/08/2020 1329   BASOSABS 0.0 07/08/2020 1329     Assessment/Plan:  1. AKI/CKD stage 4- diuretics held due to rise in BUN/Cr but still negative >0.5 liters overnight.  Awaiting labs this am.  Awaiting renal US to r/o obstruction.  2. Acute on chronic CHF- no SOB or edema.  Off of oxygen.  Continue to hold diuretics for now. 3. HTN- stable 4. DM - per primary 5. 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 Anemia of CKD stage IV- stable 7. Thrombocytopenia- cont to follow  8. Abnormal LFT's- statin on hold and cont to follow   [name], MD [facility] [phone]

## 2020-07-11 NOTE — Progress Notes (Signed)
PROGRESS NOTE    Bradley Black  VFI:[id number] DOB: Dec 01, 1945 DOA: 07/08/2020 PCP: Susy Frizzle, MD    Brief Narrative:  This 74 years old male with medical history significant for ischemic cardiomyopathy (LVEF = 45%) Multivessel CAD s/p CABG in 2013, CKD stage III, type 2 diabetes, hypertension, hyperlipidemia, frequent PVCs presents in the emergency department with worsening shortness of breath associated with orthopnea and exertional shortness of breath. 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  Cardiology consulted recommended continue IV Lasix,  Nephrology consulted given worsening renal functions. Patient is not a good candidate for cardiac catheterization in view of contrast nephropathy , advised medical optimization initially, repeat echocardiogram shows EF significantly reduced below 25%. Cardiology reevaluation recommended left heart catheterization,  Possible  transfer to [facility].  Assessment & Plan:   Active Problems:   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   Essential hypertension   Type 2 diabetes mellitus with diabetic chronic kidney disease (HCC)   Anemia in chronic kidney disease   Chronic kidney disease, stage IV (severe) (HCC)   GERD (gastroesophageal reflux disease)   SOB (shortness of breath)   Elevated LFTs  # Acute on chronic combined heart failure Exacerbation: Patient presents with shortness of breath, orthopnea, fatigue. Chest x-ray shows mild interstitial edema, BNP 4500 Admitted to telemetry, daily weights, strict intake output charting Continue Lasix 40 mg IV twice daily. Hold Benicar for now. Continue to watch closely serum creatinine given stage IV kidney disease. Nephro rec: Increase lasix to 60 mg iv bid. Repeat echocardiogram shows worsening EF less than 25%. Cardiology evaluation recommended left heart catheterization. Possible transfer to [facility].  # CAD, S/P  CABG: Continue beta-blocker and hydralazine,  Hold ACE inhibitor due to worsening renal functions. Add Imdur 30 mg p.o. daily. Troponin slightly elevated could be secondary to CKD. Patient denies any chest pain palpitation dizziness.  #Type 2 diabetes with chronic kidney disease stage IV Watch creatinine closely. Sliding scale insulin. CBGs before meals and nightly.  #AKI on CKD stage IV Baseline creatinine runs between 2.4-2.6,  Presents with creatinine 3.7 >. 4.37 >. 4.65 > 4.48 Avoid nephrotoxic medications,  Nephrology consulted , rec renal ultrasound, Lasix 60 mg iv BID. Renal ultrasound shows no hydronephrosis, consistent with medical kidney disease.  #Elevated liver enzymes: Hepatitis panel negative. Ultrasound no acute process or reasoning for elevated liver enzymes. Liver enzymes are trending up, will hold pravastatin. This could be due to congestion from heart failure.  # GERD Continue Protonix.   DVT prophylaxis: Lovenox Code Status: Full Family Communication:  No one at bed side. Disposition Plan:  Status is: Inpatient  Remains inpatient appropriate because:Inpatient level of care appropriate due to severity of illness   Dispo: The patient is from: Home              Anticipated d/c is toTBD              Anticipated d/c date is: 2 days              Patient currently is not medically stable to d/c.   Consultants:   Cardiology, Nephrology  Procedures: Echocardiogram Antimicrobials:  Anti-infectives (From admission, onward)   None      Subjective: Patient was seen and examined at bedside.  Overnight events noted. Patient reports feeling better, lying comfortably  on the bed. He is asking when he can be discharged home. Denies any  chest pain, dizziness, there is no swelling noted.   Objective: Vitals:   07/10/20 1500 07/10/20 2028 07/11/20 0404 07/11/20 0458  BP: (!) 147/74 123/65 (!) 148/107   Pulse: (!) 50 (!) 52 (!) 109   Resp: 18 15 17     Temp: 98 F (36.7 C) 99.3 F (37.4 C) 98.6 F (37 C)   TempSrc: Oral     SpO2: 98% 97% 93%   Weight:    67.9 kg  Height:        Intake/Output Summary (Last 24 hours) at 07/11/2020 1441 Last data filed at 07/11/2020 0801 Gross per 24 hour  Intake --  Output 1150 ml  Net -1150 ml   Filed Weights   07/08/20 1229 07/11/20 0458  Weight: 72.6 kg 67.9 kg    Examination:  General exam: Appears calm and comfortable.  Respiratory system: Clear to auscultation. Respiratory effort normal. Cardiovascular system: S1 & S2 heard, RRR. No JVD, murmurs, rubs, gallops or clicks. No pedal edema. Gastrointestinal system: Abdomen is nondistended, soft and nontender. No organomegaly or masses felt. Normal bowel sounds heard. Central nervous system: Alert and oriented. No focal neurological deficits. Extremities: No edema, no cyanosis no clubbing. Skin: No rashes, lesions or ulcers Psychiatry: Judgement and insight appear normal. Mood & affect appropriate.     Data Reviewed: I have personally reviewed following labs and imaging studies  CBC: Recent Labs  Lab 07/08/20 1329 07/10/20 0700 07/11/20 0818  WBC 8.2 8.5 7.5  NEUTROABS 7.0  --   --   HGB 10.8* 10.3* 10.3*  HCT 32.2* 30.8* 30.9*  MCV 96.7 94.2 94.8  PLT 110* 105* 867*   Basic Metabolic Panel: Recent Labs  Lab 07/08/20 1329 07/09/20 0440 07/10/20 0700 07/11/20 0818  NA 138 138 138 138  K 5.2* 5.1 4.1 3.7  CL 107 106 103 101  CO2 19* 19* 21* 23  GLUCOSE 140* 148* 114* 97  BUN 75* 83* 96* 93*  CREATININE 3.82* 4.37* 4.65* 4.48*  CALCIUM 8.2* 8.2* 8.7* 8.6*  MG  --   --  2.1 2.1  PHOS  --   --  4.9* 4.5   GFR: Estimated Creatinine Clearance: 13.5 mL/min (A) (by C-G formula based on SCr of 4.48 mg/dL (H)). Liver Function Tests: Recent Labs  Lab 07/08/20 1329 07/09/20 0440 07/10/20 0700 07/11/20 0818  AST 165* 290* 328* 135*  ALT 178* 322* 583* 465*  ALKPHOS 113 102 103 89  BILITOT 2.6* 2.5* 2.3* 2.1*  PROT  6.5 6.3* 6.4* 6.2*  ALBUMIN 3.7 3.5 3.5 3.3*   No results for input(s): LIPASE, AMYLASE in the last 168 hours. No results for input(s): AMMONIA in the last 168 hours. Coagulation Profile: No results for input(s): INR, PROTIME in the last 168 hours. Cardiac Enzymes: No results for input(s): CKTOTAL, CKMB, CKMBINDEX, TROPONINI in the last 168 hours. BNP (last 3 results) No results for input(s): PROBNP in the last 8760 hours. HbA1C: No results for input(s): HGBA1C in the last 72 hours. CBG: Recent Labs  Lab 07/10/20 1117 07/10/20 1618 07/10/20 2029 07/11/20 0737 07/11/20 1119  GLUCAP 192* 142* 156* 90 153*   Lipid Profile: No results for input(s): CHOL, HDL, LDLCALC, TRIG, CHOLHDL, LDLDIRECT in the last 72 hours. Thyroid Function Tests: No results for input(s): TSH, T4TOTAL, FREET4, T3FREE, THYROIDAB in the last 72 hours. Anemia Panel: No results for input(s): VITAMINB12, FOLATE, FERRITIN, TIBC, IRON, RETICCTPCT in the last 72 hours. Sepsis Labs: No results for input(s): PROCALCITON, LATICACIDVEN in the last  168 hours.  Recent Results (from the past 240 hour(s))  Respiratory Panel by RT PCR (Flu A&B, Covid) - Nasopharyngeal Swab     Status: None   Collection Time: 07/08/20  1:35 PM   Specimen: Nasopharyngeal Swab  Result Value Ref Range Status   SARS Coronavirus 2 by RT PCR NEGATIVE NEGATIVE Final    Comment: (NOTE) SARS-CoV-2 target nucleic acids are NOT DETECTED.  The SARS-CoV-2 RNA is generally detectable in upper respiratoy specimens during the acute phase of infection. The lowest concentration of SARS-CoV-2 viral copies this assay can detect is 131 copies/mL. A negative result does not preclude SARS-Cov-2 infection and should not be used as the sole basis for treatment or other patient management decisions. A negative result may occur with  improper specimen collection/handling, submission of specimen other than nasopharyngeal swab, presence of viral mutation(s)  within the areas targeted by this assay, and inadequate number of viral copies (<131 copies/mL). A negative result must be combined with clinical observations, patient history, and epidemiological information. The expected result is Negative.  Fact Sheet for Patients:  PinkCheek.be  Fact Sheet for Healthcare Providers:  GravelBags.it  This test is no t yet approved or cleared by the [place] FDA and  has been authorized for detection and/or diagnosis of SARS-CoV-2 by FDA under an Emergency Use Authorization (EUA). This EUA will remain  in effect (meaning this test can be used) for the duration of the COVID-19 declaration under Section 564(b)(1) of the Act, 21 U.S.C. section 360bbb-3(b)(1), unless the authorization is terminated or revoked sooner.     Influenza A by PCR NEGATIVE NEGATIVE Final   Influenza B by PCR NEGATIVE NEGATIVE Final    Comment: (NOTE) The Xpert Xpress SARS-CoV-2/FLU/RSV assay is intended as an aid in  the diagnosis of influenza from Nasopharyngeal swab specimens and  should not be used as a sole basis for treatment. Nasal washings and  aspirates are unacceptable for Xpert Xpress SARS-CoV-2/FLU/RSV  testing.  Fact Sheet for Patients: PinkCheek.be  Fact Sheet for Healthcare Providers: GravelBags.it  This test is not yet approved or cleared by the [place] FDA and  has been authorized for detection and/or diagnosis of SARS-CoV-2 by  FDA under an Emergency Use Authorization (EUA). This EUA will remain  in effect (meaning this test can be used) for the duration of the  Covid-19 declaration under Section 564(b)(1) of the Act, 21  U.S.C. section 360bbb-3(b)(1), unless the authorization is  terminated or revoked. Performed at [facility], [address], [place], [place] [address]   MRSA PCR Screening     Status: None   Collection Time:  07/09/20  8:45 PM   Specimen: Nasal Mucosa; Nasopharyngeal  Result Value Ref Range Status   MRSA by PCR NEGATIVE NEGATIVE Final    Comment:        The GeneXpert MRSA Assay (FDA approved for NASAL specimens only), is one component of a comprehensive MRSA colonization surveillance program. It is not intended to diagnose MRSA infection nor to guide or monitor treatment for MRSA infections. Performed at [facility], [address], [place], [place] [address]          Radiology Studies: US RENAL  Result Date: 07/11/2020 CLINICAL DATA:  Acute kidney injury. EXAM: RENAL / URINARY TRACT ULTRASOUND COMPLETE COMPARISON:  03/14/2017. FINDINGS: Right Kidney: Renal measurements: 8.5 x 4.6 x 3.5 cm = volume: 72 mL. Diffusely increased echogenicity without hydronephrosis. Left Kidney: Renal measurements: 9.8 x 5.4 x 4.5 cm = volume:  126 mL. Echogenic parenchyma with 2.9 cm cystic lesion interpolar region not substantially changed from 2.6 cm on ultrasound exam of 03/14/2017. Bladder: Appears normal for degree of bladder distention. Other: None. IMPRESSION: 1. No acute findings. No hydronephrosis. 2. 2.9 cm cyst interpolar region left kidney not substantially changed since 2018. 3. Increased echogenicity of both kidneys compatible with medical renal disease. Electronically Signed   By: Misty Stanley M.D.   On: 07/11/2020 10:53   Scheduled Meds: . aspirin EC  81 mg Oral Daily  . calcitRIOL  0.25 mcg Oral Q M,W,F  . citalopram  10 mg Oral Daily  . doxazosin  8 mg Oral Daily  . enoxaparin (LOVENOX) injection  30 mg Subcutaneous Q24H  . [START ON 07/12/2020] furosemide  40 mg Intravenous Daily  . hydrALAZINE  50 mg Oral TID  . insulin aspart  0-15 Units Subcutaneous TID WC  . insulin aspart  0-5 Units Subcutaneous QHS  . isosorbide mononitrate  30 mg Oral Daily  . metoprolol tartrate  25 mg Oral BID  . pantoprazole  40 mg Oral BID  . sodium bicarbonate  1,950 mg Oral Daily  . sodium chloride  flush  3 mL Intravenous Q12H   Continuous Infusions: . sodium chloride       LOS: 3 days    Time spent: 25 mins.    Shawna Clamp, MD [facility]   If 7PM-7AM, please contact night-coverage

## 2020-07-12 ENCOUNTER — Inpatient Hospital Stay (HOSPITAL_COMMUNITY)
Admission: EM | Disposition: A | Discharge status: Home / Self Care | Payer: Self-pay | Source: Home / Self Care | Attending: Family Medicine

## 2020-07-12 DIAGNOSIS — I493 Ventricular premature depolarization: Secondary | ICD-10-CM

## 2020-07-12 DIAGNOSIS — N186 End stage renal disease: Secondary | ICD-10-CM

## 2020-07-12 DIAGNOSIS — N184 Chronic kidney disease, stage 4 (severe): Secondary | ICD-10-CM

## 2020-07-12 DIAGNOSIS — N179 Acute kidney failure, unspecified: Secondary | ICD-10-CM

## 2020-07-12 DIAGNOSIS — I5082 Biventricular heart failure: Secondary | ICD-10-CM

## 2020-07-12 DIAGNOSIS — I5043 Acute on chronic combined systolic (congestive) and diastolic (congestive) heart failure: Secondary | ICD-10-CM

## 2020-07-12 HISTORY — PX: RIGHT HEART CATH: CATH118263

## 2020-07-12 LAB — COMPREHENSIVE METABOLIC PANEL
ALT: 351 U/L — ABNORMAL HIGH (ref 0–44)
AST: 88 U/L — ABNORMAL HIGH (ref 15–41)
Albumin: 3.3 g/dL — ABNORMAL LOW (ref 3.5–5.0)
Alkaline Phosphatase: 91 U/L (ref 38–126)
Anion gap: 13 (ref 5–15)
BUN: 98 mg/dL — ABNORMAL HIGH (ref 8–23)
CO2: 24 mmol/L (ref 22–32)
Calcium: 8.7 mg/dL — ABNORMAL LOW (ref 8.9–10.3)
Chloride: 102 mmol/L (ref 98–111)
Creatinine, Ser: 4.28 mg/dL — ABNORMAL HIGH (ref 0.61–1.24)
GFR, Estimated: 13 mL/min — ABNORMAL LOW (ref >60)
Glucose, Bld: 116 mg/dL — ABNORMAL HIGH (ref 70–99)
Potassium: 3.6 mmol/L (ref 3.5–5.1)
Sodium: 139 mmol/L (ref 135–145)
Total Bilirubin: 2 mg/dL — ABNORMAL HIGH (ref 0.3–1.2)
Total Protein: 6.2 g/dL — ABNORMAL LOW (ref 6.5–8.1)

## 2020-07-12 LAB — CBC
HCT: 32.9 % — ABNORMAL LOW (ref 39.0–52.0)
Hemoglobin: 10.9 g/dL — ABNORMAL LOW (ref 13.0–17.0)
MCH: 31.6 pg (ref 26.0–34.0)
MCHC: 33.1 g/dL (ref 30.0–36.0)
MCV: 95.4 fL (ref 80.0–100.0)
Platelets: 125 10*3/uL — ABNORMAL LOW (ref 150–400)
RBC: 3.45 MIL/uL — ABNORMAL LOW (ref 4.22–5.81)
RDW: 14.4 % (ref 11.5–15.5)
WBC: 6.6 10*3/uL (ref 4.0–10.5)
nRBC: 0 % (ref 0.0–0.2)

## 2020-07-12 LAB — POCT I-STAT EG7
Acid-Base Excess: 2 mmol/L (ref 0.0–2.0)
Acid-Base Excess: 3 mmol/L — ABNORMAL HIGH (ref 0.0–2.0)
Bicarbonate: 27.5 mmol/L (ref 20.0–28.0)
Bicarbonate: 28 mmol/L (ref 20.0–28.0)
Calcium, Ion: 1.19 mmol/L (ref 1.15–1.40)
Calcium, Ion: 1.21 mmol/L (ref 1.15–1.40)
HCT: 32 % — ABNORMAL LOW (ref 39.0–52.0)
HCT: 32 % — ABNORMAL LOW (ref 39.0–52.0)
Hemoglobin: 10.9 g/dL — ABNORMAL LOW (ref 13.0–17.0)
Hemoglobin: 10.9 g/dL — ABNORMAL LOW (ref 13.0–17.0)
O2 Saturation: 63 %
O2 Saturation: 65 %
Potassium: 4.3 mmol/L (ref 3.5–5.1)
Potassium: 4.3 mmol/L (ref 3.5–5.1)
Sodium: 142 mmol/L (ref 135–145)
Sodium: 142 mmol/L (ref 135–145)
TCO2: 29 mmol/L (ref 22–32)
TCO2: 29 mmol/L (ref 22–32)
pCO2, Ven: 43.8 mmHg — ABNORMAL LOW (ref 44.0–60.0)
pCO2, Ven: 44.5 mmHg (ref 44.0–60.0)
pH, Ven: 7.406 (ref 7.250–7.430)
pH, Ven: 7.407 (ref 7.250–7.430)
pO2, Ven: 33 mmHg (ref 32.0–45.0)
pO2, Ven: 34 mmHg (ref 32.0–45.0)

## 2020-07-12 LAB — GLUCOSE, CAPILLARY
Glucose-Capillary: 103 mg/dL — ABNORMAL HIGH (ref 70–99)
Glucose-Capillary: 101 mg/dL — ABNORMAL HIGH (ref 70–99)
Glucose-Capillary: 110 mg/dL — ABNORMAL HIGH (ref 70–99)
Glucose-Capillary: 208 mg/dL — ABNORMAL HIGH (ref 70–99)

## 2020-07-12 LAB — MAGNESIUM: Magnesium: 2.1 mg/dL (ref 1.7–2.4)

## 2020-07-12 LAB — PHOSPHORUS: Phosphorus: 4.6 mg/dL (ref 2.5–4.6)

## 2020-07-12 SURGERY — RIGHT HEART CATH
Anesthesia: LOCAL

## 2020-07-12 MED ORDER — HEPARIN (PORCINE) IN NACL 1000-0.9 UT/500ML-% IV SOLN
INTRAVENOUS | Status: AC
  Filled 2020-07-12: qty 500

## 2020-07-12 MED ORDER — MIDAZOLAM HCL 2 MG/2ML IJ SOLN
INTRAMUSCULAR | Status: AC
  Filled 2020-07-12: qty 2

## 2020-07-12 MED ORDER — POTASSIUM CHLORIDE CRYS ER 20 MEQ PO TBCR
30.0000 meq | EXTENDED_RELEASE_TABLET | Freq: Once | ORAL | Status: AC
  Administered 2020-07-12: 30 meq via ORAL
  Filled 2020-07-12: qty 1

## 2020-07-12 MED ORDER — SODIUM CHLORIDE 0.9% FLUSH: 3.0000 mL | INTRAVENOUS | Status: DC | PRN

## 2020-07-12 MED ORDER — HYDRALAZINE HCL 20 MG/ML IJ SOLN
10.0000 mg | INTRAMUSCULAR | Status: AC | PRN
  Administered 2020-07-12: 10 mg via INTRAVENOUS
  Filled 2020-07-12: qty 1

## 2020-07-12 MED ORDER — SODIUM CHLORIDE 0.9 % IV SOLN: 250.0000 mL | INTRAVENOUS | Status: DC | PRN

## 2020-07-12 MED ORDER — LIDOCAINE HCL (PF) 1 % IJ SOLN
INTRAMUSCULAR | Status: DC | PRN
  Administered 2020-07-12: 2 mL

## 2020-07-12 MED ORDER — ONDANSETRON HCL 4 MG/2ML IJ SOLN: 4.0000 mg | Freq: Four times a day (QID) | INTRAMUSCULAR | Status: DC | PRN

## 2020-07-12 MED ORDER — LABETALOL HCL 5 MG/ML IV SOLN: 10.0000 mg | INTRAVENOUS | Status: AC | PRN

## 2020-07-12 MED ORDER — HEPARIN (PORCINE) IN NACL 1000-0.9 UT/500ML-% IV SOLN
INTRAVENOUS | Status: DC | PRN
  Administered 2020-07-12: 500 mL

## 2020-07-12 MED ORDER — ACETAMINOPHEN 325 MG PO TABS: 650.0000 mg | ORAL_TABLET | ORAL | Status: DC | PRN

## 2020-07-12 MED ORDER — LIDOCAINE HCL (PF) 1 % IJ SOLN
INTRAMUSCULAR | Status: AC
  Filled 2020-07-12: qty 30

## 2020-07-12 MED ORDER — OXYCODONE HCL 5 MG PO TABS: 5.0000 mg | ORAL_TABLET | ORAL | Status: DC | PRN

## 2020-07-12 MED ORDER — HEPARIN (PORCINE) IN NACL 1000-0.9 UT/500ML-% IV SOLN
INTRAVENOUS | Status: AC
  Filled 2020-07-12: qty 1000

## 2020-07-12 MED ORDER — SODIUM CHLORIDE 0.9% FLUSH
3.0000 mL | Freq: Two times a day (BID) | INTRAVENOUS | Status: DC
  Administered 2020-07-13: 3 mL via INTRAVENOUS

## 2020-07-12 MED ORDER — AMIODARONE HCL 200 MG PO TABS
200.0000 mg | ORAL_TABLET | Freq: Two times a day (BID) | ORAL | Status: DC
  Administered 2020-07-12 – 2020-07-13 (×2): 200 mg via ORAL
  Filled 2020-07-12 (×2): qty 1

## 2020-07-12 SURGICAL SUPPLY — 8 items
CATH SWAN GANZ 7F STRAIGHT (CATHETERS) ×1 IMPLANT
GLIDESHEATH SLENDER 7FR .021G (SHEATH) ×1 IMPLANT
PACK CARDIAC CATHETERIZATION (CUSTOM PROCEDURE TRAY) ×1 IMPLANT
PROTECTION STATION PRESSURIZED (MISCELLANEOUS) ×2
STATION PROTECTION PRESSURIZED (MISCELLANEOUS) IMPLANT
TRANSDUCER W/STOPCOCK (MISCELLANEOUS) ×1 IMPLANT
TUBING ART PRESS 72  MALE/FEM (TUBING) ×2
TUBING ART PRESS 72 MALE/FEM (TUBING) IMPLANT

## 2020-07-12 NOTE — CV Procedure (Signed)
   Mild pulmonary hypertension with mean PA pressure 21 mmHg  Pulmonary capillary wedge pressure mean 11 mmHg.  Normal right atrial pressure.  Cardiac output 4.6 L/min.  Pulmonary vascular resistance 2.2 Wood units

## 2020-07-12 NOTE — Progress Notes (Signed)
Received pt from [facility]  alert and oriented X4, skin warm and dry , resp even and unlabored, and denies any chest pain or discomfort at this time.  IVF infusing and consent signed.

## 2020-07-12 NOTE — Interval H&P Note (Signed)
Cath Lab Visit (complete for each Cath Lab visit)  Clinical Evaluation Leading to the Procedure:   ACS: No.  Non-ACS:    Anginal Classification: No Symptoms  Anti-ischemic medical therapy: No Therapy  Non-Invasive Test Results: No non-invasive testing performed  Prior CABG: No previous CABG      History and Physical Interval Note:  07/12/2020 3:30 PM  Bradley Black  has presented today for surgery, with the diagnosis of chest pain - heart failure.  The various methods of treatment have been discussed with the patient and family. After consideration of risks, benefits and other options for treatment, the patient has consented to  Procedure(s): RIGHT HEART CATH (N/A) as a surgical intervention.  The patient's history has been reviewed, patient examined, no change in status, stable for surgery.  I have reviewed the patient's chart and labs.  Questions were answered to the patient's satisfaction.     Belva Crome III

## 2020-07-12 NOTE — Progress Notes (Addendum)
Progress Note  Patient Name: Bradley Black Date of Encounter: 07/12/2020  Primary Cardiologist: Previously Dr. Bronson Ing  Subjective   No chest pain overnight. Breathing improved. He has been NPO since midnight. Anxious about cath.   Inpatient Medications    Scheduled Meds: . [START ON 07/13/2020] aspirin EC  81 mg Oral Daily  . calcitRIOL  0.25 mcg Oral Q M,W,F  . citalopram  10 mg Oral Daily  . doxazosin  8 mg Oral Daily  . enoxaparin (LOVENOX) injection  30 mg Subcutaneous Q24H  . furosemide  40 mg Intravenous Daily  . hydrALAZINE  50 mg Oral TID  . insulin aspart  0-15 Units Subcutaneous TID WC  . insulin aspart  0-5 Units Subcutaneous QHS  . isosorbide mononitrate  30 mg Oral Daily  . metoprolol tartrate  25 mg Oral BID  . pantoprazole  40 mg Oral BID  . sodium bicarbonate  1,950 mg Oral Daily  . sodium chloride flush  3 mL Intravenous Q12H  . sodium chloride flush  3 mL Intravenous Q12H   Continuous Infusions: . sodium chloride    . sodium chloride    . sodium chloride 70 mL/hr at 07/12/20 0640   PRN Meds: sodium chloride, sodium chloride, acetaminophen, ondansetron (ZOFRAN) IV, sodium chloride flush, sodium chloride flush   Vital Signs    Vitals:   07/11/20 2036 07/11/20 2040 07/12/20 0543 07/12/20 0645  BP: 133/60  (!) 143/71   Pulse: (!) 47  (!) 51   Resp: 20  20   Temp:  97.9 F (36.6 C) 98.7 F (37.1 C)   TempSrc:  Oral Oral   SpO2: 96%  97%   Weight:    64.3 kg  Height:        Intake/Output Summary (Last 24 hours) at 07/12/2020 1004 Last data filed at 07/12/2020 5003 Gross per 24 hour  Intake --  Output 1050 ml  Net -1050 ml    Last 3 Weights 07/12/2020 07/11/2020 07/08/2020  Weight (lbs) 141 lb 12.1 oz 149 lb 11.1 oz 160 lb  Weight (kg) 64.3 kg 67.9 kg 72.576 kg      Telemetry    NSR, HR in 50's with frequent PVC's and episodes of NSVT lasting for 6-7 beats and frequent triplets.  - Personally Reviewed  ECG    No new tracings.    Physical Exam   General: Thin Caucasian male appearing in no acute distress. Head: Normocephalic, atraumatic.  Neck: Supple without bruits, JVD at 8 cm. Lungs:  Resp regular and unlabored, mild rales along bases. Heart: RRR with frequent ectopic beats, S1, S2, no S3, S4, or murmur; no rub. Abdomen: Soft, non-tender, non-distended with normoactive bowel sounds. No hepatomegaly. No rebound/guarding. No obvious abdominal masses. Extremities: No clubbing, cyanosis, or lower extremity edema. Distal pedal pulses are 2+ bilaterally. Neuro: Alert and oriented X 3. Moves all extremities spontaneously. Psych: Normal affect.  Labs    Chemistry Recent Labs  Lab 07/10/20 0700 07/11/20 0818 07/12/20 0829  NA 138 138 139  K 4.1 3.7 3.6  CL 103 101 102  CO2 21* 23 24  GLUCOSE 114* 97 116*  BUN 96* 93* 98*  CREATININE 4.65* 4.48* 4.28*  CALCIUM 8.7* 8.6* 8.7*  PROT 6.4* 6.2* 6.2*  ALBUMIN 3.5 3.3* 3.3*  AST 328* 135* 88*  ALT 583* 465* 351*  ALKPHOS 103 89 91  BILITOT 2.3* 2.1* 2.0*  GFRNONAA 11* 12* 13*  ANIONGAP 14 14 13      Hematology  Recent Labs  Lab 07/10/20 0700 07/11/20 0818 07/12/20 0829  WBC 8.5 7.5 6.6  RBC 3.27* 3.26* 3.45*  HGB 10.3* 10.3* 10.9*  HCT 30.8* 30.9* 32.9*  MCV 94.2 94.8 95.4  MCH 31.5 31.6 31.6  MCHC 33.4 33.3 33.1  RDW 14.1 14.3 14.4  PLT 105* 107* 125*    BNP Recent Labs  Lab 07/08/20 1329  BNP >4,500.0*     Radiology    US RENAL  Result Date: 07/11/2020 CLINICAL DATA:  Acute kidney injury. EXAM: RENAL / URINARY TRACT ULTRASOUND COMPLETE COMPARISON:  03/14/2017. FINDINGS: Right Kidney: Renal measurements: 8.5 x 4.6 x 3.5 cm = volume: 72 mL. Diffusely increased echogenicity without hydronephrosis. Left Kidney: Renal measurements: 9.8 x 5.4 x 4.5 cm = volume: 126 mL. Echogenic parenchyma with 2.9 cm cystic lesion interpolar region not substantially changed from 2.6 cm on ultrasound exam of 03/14/2017. Bladder: Appears normal for degree of  bladder distention. Other: None. IMPRESSION: 1. No acute findings. No hydronephrosis. 2. 2.9 cm cyst interpolar region left kidney not substantially changed since 2018. 3. Increased echogenicity of both kidneys compatible with medical renal disease. Electronically Signed   By: Misty Stanley M.D.   On: 07/11/2020 10:53    Cardiac Studies   Echocardiogram: 07/2020 IMPRESSIONS    1. Compared to previous echo, LVEF and RVEF are worse . Left ventricular  ejection fraction, by estimation, is 25%%. The left ventricle has severely  decreased function. The left ventricle demonstrates global hypokinesis.  The left ventricular internal  cavity size was moderately dilated. Left ventricular diastolic parameters  are consistent with Grade III diastolic dysfunction (restrictive).  Elevated left atrial pressure.  2. Right ventricular systolic function is severely reduced. The right  ventricular size is mildly enlarged. There is moderately elevated  pulmonary artery systolic pressure.  3. Left atrial size was severely dilated.  4. Right atrial size was moderately dilated.  5. The mitral valve is normal in structure. Mild to moderate mitral valve  regurgitation.  6. The aortic valve is tricuspid. Aortic valve regurgitation is trivial.  7. The inferior vena cava is dilated in size with <50% respiratory  variability, suggesting right atrial pressure of 15 mmHg.   Patient Profile     74 y.o. male w/ PMH of CAD(multiple prior interventions with subsequent CABG in 11/2011 with LIMA-LAD, SVG-PDA, SVG-OM2, and SVG-D1), chronic combined systolic and diastolic CHF (EF 98% by echocardiogram in 11/2015, 45% by echo in 10/2018), baseline bradycardia, PVC's (prior monitor showing 17% PVC burden) HTN, HLD, Type 2 DM, and Stage 4 CKD who is currently admitted for CHF.   Assessment & Plan    1. Acute on Chronic Combined Systolic and Diastolic CHF - BNP > 3382 on admission and HS Troponin values flat at 34  and 36. Repeat echo this admission shows EF has worsened to 25% with global HK and Grade 3 DD. Also noted to have severely reduced RV function, biatrial dilation and mild to moderate MR.  - He was receiving IV Lasix which has now been held in anticipation of catheterization. Benefits of R/LHC previously discussed with the patient and his wife by Dr. Harrington Challenger as documented. Reviewed again with the patient in person and his wife by phone today and they are aware a catheterization could lead to initiation of dialysis. Per discussions with Nephrology, he is not an ideal long-term HD candidate given his biventricular failure. Discussed options in regards to medical therapy vs. plans for cath with both the patient and his  wife and are in agreement for cath. If no options for PCI by cath, would recommend Palliative Care consult.    2. CAD - He is s/p multiple prior interventions with subsequent CABG in 11/2011 with LIMA-LAD, SVG-PDA, SVG-OM2, and SVG-D1. Plan for repeat R/LHC as outlined above.  - Continue ASA and BB. Statin therapy held given elevated LFT's.   3. NSVT/PVC's - Recent monitor showed 17% PVC burden. K+ 3.6 this AM. Will order replacement to keep K+ ~ 4.0. Mg 2.1. On Lopressor 25mg  BID.   4. HTN - BP has overall been well-controlled at 133/60 - 143/71 within the past 24 hours. Remains on Cardura, Hydralazine, Imdur and Lopressor.   5. HLD - LDL was at 73 in 04/2019. Will recheck FLP with AM labs. Was on Pravastatin as an outpatient which is currently held given elevated LFT's  6. Transaminitis - AST 88 and ALT 351 this AM. Statin therapy held. Continue to follow throughout admission.   7. Stage 4 CKD - Nephrology following this admission. Creatinine peaked at 4.65 on 10/10, at 4.28 this AM. Previously 2.2 - 2.8 in 2020, at 3.82 in 07/2020.  For questions or updates, please contact [place] Please consult [email or web address] for contact info under Cardiology/STEMI.   Arna Medici , PA-C 10:04 AM 07/12/2020 Pager: [phone]   Attending note:  Patient seen and examined.  I reviewed hospital course since initial consultation last week, also input from nephrology and plan per Dr. Harrington Challenger.  Echocardiogram done during this hospital stay shows evidence of biventricular dysfunction, LVEF now approximately 25% with global hypokinesis and also severe RV dysfunction.  Bradley Black has had relative improvement in fluid overload with IV diuresis, although renal function has further deteriorated.  He has CKD stage IV at baseline with peak creatinine up to 4.65, down to 4.28 this morning.  Creatinine was most recently 3.82 in October.  He has not been hypotensive recently, does have transaminitis raising possibility of passive hepatic congestion with heart failure, but question of true low output is not clear as yet.  ARB was held at admission.  He reports no breathlessness at rest, feels very weak.  No obvious angina.  He is afebrile.  Heart rate in the 50s to 60s in sinus rhythm by telemetry, frequent PVCs noted in brief bursts of NSVT.  Recent systolic 546.  Pertinent lab work includes potassium 3.6, BUN 98, creatinine 4.28, AST 88, ALT 351, hemoglobin 10.9.  I reviewed the current situation, also discussed the case by phone with Dr. Haroldine Laws for input from the Advanced Heart Failure Team.  I do not agree with proceeding to a left heart catheterization at this time, and this will be canceled.  His risk of contrast nephropathy is quite high, and as already pointed out by nephrology, it is not felt that he will tolerate hemodialysis well particular in light of his RV dysfunction.  It would also be surprising to find an easily fixed revascularization option that would lead to substantial improvement in LVEF and reduction in PVCs.  If anything he will have evidence of progressive graft disease which in most cases would be managed medically.  My sense is that we should further suppress  his PVCs with amiodarone, and to that end we will start 200 mg twice daily as a low-dose load.  Also hold Lopressor for now in case there is any component of low output.  He can undergo a right heart catheterization which could at least give [place] hemodynamic  information and determine whether a course of inotropes might be considered.  Continue aspirin, hydralazine, Imdur otherwise.  Statin is on hold in light of transaminitis.  Heart failure team will round on patient tomorrow.  It may be that palliative care should ultimately be consulted depending on clinical progress and whether or not there are reasonable treatment options going forward.  Satira Sark, M.D., F.A.C.C.

## 2020-07-12 NOTE — Progress Notes (Signed)
Patient ID: Bradley Black, male   DOB: 1945-12-16, 74 y.o.   MRN: [id number] S: No new complaints.  Results of ECHO noted.  Pt with ongoing ectopy and Cardiology recommending left and right heart cath.  Pt aware of risks of progressive CKD and possible need for dialysis following procedure. O:BP (!) 143/71 (BP Location: Left Arm)   Pulse (!) 51   Temp 98.7 F (37.1 C) (Oral)   Resp 20   Ht 5\' 8"  (1.727 m)   Wt 64.3 kg   SpO2 97%   BMI 21.55 kg/m   Intake/Output Summary (Last 24 hours) at 07/12/2020 1052 Last data filed at 07/12/2020 6270 Gross per 24 hour  Intake --  Output 1050 ml  Net -1050 ml   Intake/Output: I/O last 3 completed shifts: In: -  Out: 2000 [Urine:2000]  Intake/Output this shift:  No intake/output data recorded. Weight change: -3.6 kg Gen: NAD JJK:[id number], bradycardic Resp: cta Abd: +BS, soft, NT/ND Ext: no edema  Recent Labs  Lab 07/08/20 1329 07/09/20 0440 07/10/20 0700 07/11/20 0818 07/12/20 0829  NA 138 138 138 138 139  K 5.2* 5.1 4.1 3.7 3.6  CL 107 106 103 101 102  CO2 19* 19* 21* 23 24  GLUCOSE 140* 148* 114* 97 116*  BUN 75* 83* 96* 93* 98*  CREATININE 3.82* 4.37* 4.65* 4.48* 4.28*  ALBUMIN 3.7 3.5 3.5 3.3* 3.3*  CALCIUM 8.2* 8.2* 8.7* 8.6* 8.7*  PHOS  --   --  4.9* 4.5 4.6  AST 165* 290* 328* 135* 88*  ALT 178* 322* 583* 465* 351*   Liver Function Tests: Recent Labs  Lab 07/10/20 0700 07/11/20 0818 07/12/20 0829  AST 328* 135* 88*  ALT 583* 465* 351*  ALKPHOS 103 89 91  BILITOT 2.3* 2.1* 2.0*  PROT 6.4* 6.2* 6.2*  ALBUMIN 3.5 3.3* 3.3*   No results for input(s): LIPASE, AMYLASE in the last 168 hours. No results for input(s): AMMONIA in the last 168 hours. CBC: Recent Labs  Lab 07/08/20 1329 07/08/20 1329 07/10/20 0700 07/11/20 0818 07/12/20 0829  WBC 8.2   < > 8.5 7.5 6.6  NEUTROABS 7.0  --   --   --   --   HGB 10.8*   < > 10.3* 10.3* 10.9*  HCT 32.2*   < > 30.8* 30.9* 32.9*  MCV 96.7  --  94.2 94.8 95.4  PLT  110*   < > 105* 107* 125*   < > = values in this interval not displayed.   Cardiac Enzymes: No results for input(s): CKTOTAL, CKMB, CKMBINDEX, TROPONINI in the last 168 hours. CBG: Recent Labs  Lab 07/11/20 0737 07/11/20 1119 07/11/20 1701 07/11/20 2036 07/12/20 0744  GLUCAP 90 153* 132* 122* 103*    Iron Studies: No results for input(s): IRON, TIBC, TRANSFERRIN, FERRITIN in the last 72 hours. Studies/Results: US RENAL  Result Date: 07/11/2020 CLINICAL DATA:  Acute kidney injury. EXAM: RENAL / URINARY TRACT ULTRASOUND COMPLETE COMPARISON:  03/14/2017. FINDINGS: Right Kidney: Renal measurements: 8.5 x 4.6 x 3.5 cm = volume: 72 mL. Diffusely increased echogenicity without hydronephrosis. Left Kidney: Renal measurements: 9.8 x 5.4 x 4.5 cm = volume: 126 mL. Echogenic parenchyma with 2.9 cm cystic lesion interpolar region not substantially changed from 2.6 cm on ultrasound exam of 03/14/2017. Bladder: Appears normal for degree of bladder distention. Other: None. IMPRESSION: 1. No acute findings. No hydronephrosis. 2. 2.9 cm cyst interpolar region left kidney not substantially changed since 2018. 3. Increased echogenicity of both  kidneys compatible with medical renal disease. Electronically Signed   By: Bradley Black M.D.   On: 07/11/2020 10:53   . [START ON 07/13/2020] aspirin EC  81 mg Oral Daily  . calcitRIOL  0.25 mcg Oral Q M,W,F  . citalopram  10 mg Oral Daily  . doxazosin  8 mg Oral Daily  . enoxaparin (LOVENOX) injection  30 mg Subcutaneous Q24H  . furosemide  40 mg Intravenous Daily  . hydrALAZINE  50 mg Oral TID  . insulin aspart  0-15 Units Subcutaneous TID WC  . insulin aspart  0-5 Units Subcutaneous QHS  . isosorbide mononitrate  30 mg Oral Daily  . metoprolol tartrate  25 mg Oral BID  . pantoprazole  40 mg Oral BID  . sodium bicarbonate  1,950 mg Oral Daily  . sodium chloride flush  3 mL Intravenous Q12H  . sodium chloride flush  3 mL Intravenous Q12H    BMET     Component Value Date/Time   NA 139 07/12/2020 0829   K 3.6 07/12/2020 0829   CL 102 07/12/2020 0829   CO2 24 07/12/2020 0829   GLUCOSE 116 (H) 07/12/2020 0829   BUN 98 (H) 07/12/2020 0829   CREATININE 4.28 (H) 07/12/2020 0829   CREATININE 2.84 (H) 05/01/2019 0945   CALCIUM 8.7 (L) 07/12/2020 0829   GFRNONAA 13 (L) 07/12/2020 0829   GFRNONAA 21 (L) 05/01/2019 0945   GFRAA 24 (L) 05/01/2019 0945   CBC    Component Value Date/Time   WBC 6.6 07/12/2020 0829   RBC 3.45 (L) 07/12/2020 0829   HGB 10.9 (L) 07/12/2020 0829   HCT 32.9 (L) 07/12/2020 0829   PLT 125 (L) 07/12/2020 0829   MCV 95.4 07/12/2020 0829   MCH 31.6 07/12/2020 0829   MCHC 33.1 07/12/2020 0829   RDW 14.4 07/12/2020 0829   LYMPHSABS 0.6 (L) 07/08/2020 1329   MONOABS 0.6 07/08/2020 1329   EOSABS 0.0 07/08/2020 1329   BASOSABS 0.0 07/08/2020 1329     Assessment/Plan:  1. AKI/CKD stage 4- diuretics held due to rise in BUN/Cr but still negative >0.5 liters overnight.  Awaiting labs this am.  Renal US without obstruction.  1. Decreased lasix to daily with slowly improving BUN/Cr 2. Currently receiving IVF's due to pending heart cath 3. Will dc furosemide for now. 4. With biventricular failure, not sure how well he would do with dialysis.  Discussed with Cardiology and the patient. 2. Acute on chronic CHF- no SOB or edema.  Off of oxygen.  1. ECHO with significantly reduced LV EF and now with RV failure.  EF 25% and global hypokinesis, grade III diastolic dysfunction, enlarged RV with severely reduced RV systolic function and moderate pulmonary HTN 2. For left and right heart cath per cardiology and hopefully they can find something treatable, however given poor cardiac function, I don't think he will do well with longterm HD. 3. Recommend heart failure team to evaluate him as well. 4. Consider Palliative care consult to help set goals/limits of care. 3. PVC's- per EP recommended finding source of ectopy with L and  RHC. 4. HTN- stable 5. DM - per primary 6. 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 Anemia of CKD stage IV- stable 8. Thrombocytopenia- cont to follow  9. Abnormal LFT's- statin on hold and cont to follow  [name], MD [facility] [phone]

## 2020-07-12 NOTE — H&P (View-Only) (Signed)
Progress Note  Patient Name: Bradley Black Date of Encounter: 07/12/2020  Primary Cardiologist: Previously Dr. Bronson Ing  Subjective   No chest pain overnight. Breathing improved. He has been NPO since midnight. Anxious about cath.   Inpatient Medications    Scheduled Meds: . [START ON 07/13/2020] aspirin EC  81 mg Oral Daily  . calcitRIOL  0.25 mcg Oral Q M,W,F  . citalopram  10 mg Oral Daily  . doxazosin  8 mg Oral Daily  . enoxaparin (LOVENOX) injection  30 mg Subcutaneous Q24H  . furosemide  40 mg Intravenous Daily  . hydrALAZINE  50 mg Oral TID  . insulin aspart  0-15 Units Subcutaneous TID WC  . insulin aspart  0-5 Units Subcutaneous QHS  . isosorbide mononitrate  30 mg Oral Daily  . metoprolol tartrate  25 mg Oral BID  . pantoprazole  40 mg Oral BID  . sodium bicarbonate  1,950 mg Oral Daily  . sodium chloride flush  3 mL Intravenous Q12H  . sodium chloride flush  3 mL Intravenous Q12H   Continuous Infusions: . sodium chloride    . sodium chloride    . sodium chloride 70 mL/hr at 07/12/20 0640   PRN Meds: sodium chloride, sodium chloride, acetaminophen, ondansetron (ZOFRAN) IV, sodium chloride flush, sodium chloride flush   Vital Signs    Vitals:   07/11/20 2036 07/11/20 2040 07/12/20 0543 07/12/20 0645  BP: 133/60  (!) 143/71   Pulse: (!) 47  (!) 51   Resp: 20  20   Temp:  97.9 F (36.6 C) 98.7 F (37.1 C)   TempSrc:  Oral Oral   SpO2: 96%  97%   Weight:    64.3 kg  Height:        Intake/Output Summary (Last 24 hours) at 07/12/2020 1004 Last data filed at 07/12/2020 8938 Gross per 24 hour  Intake --  Output 1050 ml  Net -1050 ml    Last 3 Weights 07/12/2020 07/11/2020 07/08/2020  Weight (lbs) 141 lb 12.1 oz 149 lb 11.1 oz 160 lb  Weight (kg) 64.3 kg 67.9 kg 72.576 kg      Telemetry    NSR, HR in 50's with frequent PVC's and episodes of NSVT lasting for 6-7 beats and frequent triplets.  - Personally Reviewed  ECG    No new tracings.    Physical Exam   General: Thin Caucasian male appearing in no acute distress. Head: Normocephalic, atraumatic.  Neck: Supple without bruits, JVD at 8 cm. Lungs:  Resp regular and unlabored, mild rales along bases. Heart: RRR with frequent ectopic beats, S1, S2, no S3, S4, or murmur; no rub. Abdomen: Soft, non-tender, non-distended with normoactive bowel sounds. No hepatomegaly. No rebound/guarding. No obvious abdominal masses. Extremities: No clubbing, cyanosis, or lower extremity edema. Distal pedal pulses are 2+ bilaterally. Neuro: Alert and oriented X 3. Moves all extremities spontaneously. Psych: Normal affect.  Labs    Chemistry Recent Labs  Lab 07/10/20 0700 07/11/20 0818 07/12/20 0829  NA 138 138 139  K 4.1 3.7 3.6  CL 103 101 102  CO2 21* 23 24  GLUCOSE 114* 97 116*  BUN 96* 93* 98*  CREATININE 4.65* 4.48* 4.28*  CALCIUM 8.7* 8.6* 8.7*  PROT 6.4* 6.2* 6.2*  ALBUMIN 3.5 3.3* 3.3*  AST 328* 135* 88*  ALT 583* 465* 351*  ALKPHOS 103 89 91  BILITOT 2.3* 2.1* 2.0*  GFRNONAA 11* 12* 13*  ANIONGAP 14 14 13      Hematology  Recent Labs  Lab 07/10/20 0700 07/11/20 0818 07/12/20 0829  WBC 8.5 7.5 6.6  RBC 3.27* 3.26* 3.45*  HGB 10.3* 10.3* 10.9*  HCT 30.8* 30.9* 32.9*  MCV 94.2 94.8 95.4  MCH 31.5 31.6 31.6  MCHC 33.4 33.3 33.1  RDW 14.1 14.3 14.4  PLT 105* 107* 125*    BNP Recent Labs  Lab 07/08/20 1329  BNP >4,500.0*     Radiology    US RENAL  Result Date: 07/11/2020 CLINICAL DATA:  Acute kidney injury. EXAM: RENAL / URINARY TRACT ULTRASOUND COMPLETE COMPARISON:  03/14/2017. FINDINGS: Right Kidney: Renal measurements: 8.5 x 4.6 x 3.5 cm = volume: 72 mL. Diffusely increased echogenicity without hydronephrosis. Left Kidney: Renal measurements: 9.8 x 5.4 x 4.5 cm = volume: 126 mL. Echogenic parenchyma with 2.9 cm cystic lesion interpolar region not substantially changed from 2.6 cm on ultrasound exam of 03/14/2017. Bladder: Appears normal for degree of  bladder distention. Other: None. IMPRESSION: 1. No acute findings. No hydronephrosis. 2. 2.9 cm cyst interpolar region left kidney not substantially changed since 2018. 3. Increased echogenicity of both kidneys compatible with medical renal disease. Electronically Signed   By: Misty Stanley M.D.   On: 07/11/2020 10:53    Cardiac Studies   Echocardiogram: 07/2020 IMPRESSIONS    1. Compared to previous echo, LVEF and RVEF are worse . Left ventricular  ejection fraction, by estimation, is 25%%. The left ventricle has severely  decreased function. The left ventricle demonstrates global hypokinesis.  The left ventricular internal  cavity size was moderately dilated. Left ventricular diastolic parameters  are consistent with Grade III diastolic dysfunction (restrictive).  Elevated left atrial pressure.  2. Right ventricular systolic function is severely reduced. The right  ventricular size is mildly enlarged. There is moderately elevated  pulmonary artery systolic pressure.  3. Left atrial size was severely dilated.  4. Right atrial size was moderately dilated.  5. The mitral valve is normal in structure. Mild to moderate mitral valve  regurgitation.  6. The aortic valve is tricuspid. Aortic valve regurgitation is trivial.  7. The inferior vena cava is dilated in size with <50% respiratory  variability, suggesting right atrial pressure of 15 mmHg.   Patient Profile     74 y.o. male w/ PMH of CAD(multiple prior interventions with subsequent CABG in 11/2011 with LIMA-LAD, SVG-PDA, SVG-OM2, and SVG-D1), chronic combined systolic and diastolic CHF (EF 74% by echocardiogram in 11/2015, 45% by echo in 10/2018), baseline bradycardia, PVC's (prior monitor showing 17% PVC burden) HTN, HLD, Type 2 DM, and Stage 4 CKD who is currently admitted for CHF.   Assessment & Plan    1. Acute on Chronic Combined Systolic and Diastolic CHF - BNP > 1287 on admission and HS Troponin values flat at 34  and 36. Repeat echo this admission shows EF has worsened to 25% with global HK and Grade 3 DD. Also noted to have severely reduced RV function, biatrial dilation and mild to moderate MR.  - He was receiving IV Lasix which has now been held in anticipation of catheterization. Benefits of R/LHC previously discussed with the patient and his wife by Dr. Harrington Challenger as documented. Reviewed again with the patient in person and his wife by phone today and they are aware a catheterization could lead to initiation of dialysis. Per discussions with Nephrology, he is not an ideal long-term HD candidate given his biventricular failure. Discussed options in regards to medical therapy vs. plans for cath with both the patient and his  wife and are in agreement for cath. If no options for PCI by cath, would recommend Palliative Care consult.    2. CAD - He is s/p multiple prior interventions with subsequent CABG in 11/2011 with LIMA-LAD, SVG-PDA, SVG-OM2, and SVG-D1. Plan for repeat R/LHC as outlined above.  - Continue ASA and BB. Statin therapy held given elevated LFT's.   3. NSVT/PVC's - Recent monitor showed 17% PVC burden. K+ 3.6 this AM. Will order replacement to keep K+ ~ 4.0. Mg 2.1. On Lopressor 25mg  BID.   4. HTN - BP has overall been well-controlled at 133/60 - 143/71 within the past 24 hours. Remains on Cardura, Hydralazine, Imdur and Lopressor.   5. HLD - LDL was at 73 in 04/2019. Will recheck FLP with AM labs. Was on Pravastatin as an outpatient which is currently held given elevated LFT's  6. Transaminitis - AST 88 and ALT 351 this AM. Statin therapy held. Continue to follow throughout admission.   7. Stage 4 CKD - Nephrology following this admission. Creatinine peaked at 4.65 on 10/10, at 4.28 this AM. Previously 2.2 - 2.8 in 2020, at 3.82 in 07/2020.  For questions or updates, please contact [facility] Please consult [email or web address] for contact info under Cardiology/STEMI.   Arna Medici , PA-C 10:04 AM 07/12/2020 Pager: [phone]   Attending note:  Patient seen and examined.  I reviewed hospital course since initial consultation last week, also input from nephrology and plan per Dr. Harrington Challenger.  Echocardiogram done during this hospital stay shows evidence of biventricular dysfunction, LVEF now approximately 25% with global hypokinesis and also severe RV dysfunction.  Mr. Ponciano has had relative improvement in fluid overload with IV diuresis, although renal function has further deteriorated.  He has CKD stage IV at baseline with peak creatinine up to 4.65, down to 4.28 this morning.  Creatinine was most recently 3.82 in October.  He has not been hypotensive recently, does have transaminitis raising possibility of passive hepatic congestion with heart failure, but question of true low output is not clear as yet.  ARB was held at admission.  He reports no breathlessness at rest, feels very weak.  No obvious angina.  He is afebrile.  Heart rate in the 50s to 60s in sinus rhythm by telemetry, frequent PVCs noted in brief bursts of NSVT.  Recent systolic 354.  Pertinent lab work includes potassium 3.6, BUN 98, creatinine 4.28, AST 88, ALT 351, hemoglobin 10.9.  I reviewed the current situation, also discussed the case by phone with Dr. Haroldine Laws for input from the Advanced Heart Failure Team.  I do not agree with proceeding to a left heart catheterization at this time, and this will be canceled.  His risk of contrast nephropathy is quite high, and as already pointed out by nephrology, it is not felt that he will tolerate hemodialysis well particular in light of his RV dysfunction.  It would also be surprising to find an easily fixed revascularization option that would lead to substantial improvement in LVEF and reduction in PVCs.  If anything he will have evidence of progressive graft disease which in most cases would be managed medically.  My sense is that we should further suppress  his PVCs with amiodarone, and to that end we will start 200 mg twice daily as a low-dose load.  Also hold Lopressor for now in case there is any component of low output.  He can undergo a right heart catheterization which could at least give [place] hemodynamic  information and determine whether a course of inotropes might be considered.  Continue aspirin, hydralazine, Imdur otherwise.  Statin is on hold in light of transaminitis.  Heart failure team will round on patient tomorrow.  It may be that palliative care should ultimately be consulted depending on clinical progress and whether or not there are reasonable treatment options going forward.  Satira Sark, M.D., F.A.C.C.

## 2020-07-12 NOTE — Progress Notes (Signed)
PROGRESS NOTE    Bradley Black  BPZ:[id number] DOB: 09-18-1946 DOA: 07/08/2020 PCP: Susy Frizzle, MD    Brief Narrative:  This 74 years old male with medical history significant for ischemic cardiomyopathy (LVEF = 45%) Multivessel CAD s/p CABG in 2013, CKD stage III, type 2 diabetes, hypertension, hyperlipidemia, frequent PVCs presents in the emergency department with worsening shortness of breath associated with orthopnea and exertional shortness of breath. 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  Cardiology consulted recommended continue IV Lasix,  Nephrology consulted given worsening renal functions. Patient is not a good candidate for cardiac catheterization in view of contrast nephropathy , advised medical optimization initially, Repeat echocardiogram shows EF significantly reduced below 25%. Cardiology reevaluation recommended left and right heart catheterization, Patient is being transferred to [facility].  Patient may require hemodialysis post cardiac catheterization given worsening renal functions.  Heart failure team has to be consulted.  Assessment & Plan:   Active Problems:   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   Essential hypertension   Type 2 diabetes mellitus with diabetic chronic kidney disease (HCC)   Anemia in chronic kidney disease   Chronic kidney disease, stage IV (severe) (HCC)   GERD (gastroesophageal reflux disease)   SOB (shortness of breath)   Elevated LFTs  # Acute on chronic combined heart failure Exacerbation: Patient presented with shortness of breath, orthopnea, fatigue. Chest x-ray shows mild interstitial edema, BNP 4500 Admitted to telemetry, daily weights, strict intake output charting Continued Lasix 40 mg IV twice daily. Hold Benicar for now. Continue to watch closely serum creatinine given stage IV kidney disease. Nephro rec: He was given lasix 60 mg IV BID. Repeat echocardiogram shows worsening EF  less than 25%. Cardiology evaluation recommended left and right heart catheterization.  Possible transfer to [facility]. Heart failure team needs to be consulted. Lasix is discontinued for now.  # CAD, S/P CABG: Continue beta-blocker and hydralazine,  Hold ACE inhibitor due to worsening renal functions. Added Imdur 30 mg p.o. daily. Troponin slightly elevated could be secondary to CKD. Patient denies any chest pain,  Palpitations, dizziness.  #Type 2 diabetes with chronic kidney disease stage IV Sliding scale insulin. CBGs before meals and nightly.  #AKI on CKD stage IV Baseline creatinine runs between 2.4-2.6,  Presents with creatinine 3.7 > trending up 4.37 >4.65 > 4.48> 4.28 Avoid nephrotoxic medications,  Nephrology consulted , rec renal ultrasound, Lasix 60 mg iv BID. Renal ultrasound shows no hydronephrosis, consistent with medical kidney disease. Patient might require hemodialysis post cardiac catheterization. Patient might not be a good candidate for long-term hemodialysis given right heart failure. Possibility of hemodialysis post procedure discussed with patient.  #Elevated liver enzymes: Hepatitis panel negative. Ultrasound no acute process or reasoning for elevated liver enzymes. Liver enzymes are trending down,  Pravastatin on hold. This could be due to congestion from heart failure.  # GERD Continue Protonix.   DVT prophylaxis: Lovenox Code Status: Full Family Communication: Wife was at bedside. Disposition Plan:  Status is: Inpatient  Remains inpatient appropriate because:Inpatient level of care appropriate due to severity of illness   Dispo: The patient is from: Home              Anticipated d/c is toTBD              Anticipated d/c date is: 2 days              Patient currently is not  medically stable to d/c.   Barriers: Ongoing treatment for AKI on CKD,Cardiac workup    Consultants:   Cardiology, Nephrology  Procedures:  Echocardiogram Antimicrobials:  Anti-infectives (From admission, onward)   None      Subjective: Patient was seen and examined at bedside.  Overnight events noted.  Patient is lying comfortably on the bed,  denies any difficulty breathing or chest pain.   He appears anxious about having a catheterization.   Objective: Vitals:   07/11/20 2036 07/11/20 2040 07/12/20 0543 07/12/20 0645  BP: 133/60  (!) 143/71   Pulse: (!) 47  (!) 51   Resp: 20  20   Temp:  97.9 F (36.6 C) 98.7 F (37.1 C)   TempSrc:  Oral Oral   SpO2: 96%  97%   Weight:    64.3 kg  Height:        Intake/Output Summary (Last 24 hours) at 07/12/2020 1121 Last data filed at 07/12/2020 0642 Gross per 24 hour  Intake --  Output 1050 ml  Net -1050 ml   Filed Weights   07/08/20 1229 07/11/20 0458 07/12/20 0645  Weight: 72.6 kg 67.9 kg 64.3 kg    Examination:  General exam: Appears calm and comfortable.  Respiratory system: Clear to auscultation. Respiratory effort normal. Cardiovascular system: S1 & S2 heard, RRR. No JVD, murmurs, rubs, gallops or clicks. No pedal edema. Gastrointestinal system: Abdomen is nondistended, soft and nontender. No organomegaly or masses felt. Normal bowel sounds heard. Central nervous system: Alert and oriented. No focal neurological deficits. Extremities: No edema, no cyanosis no clubbing. Skin: No rashes, lesions or ulcers Psychiatry: Judgement and insight appear normal. Mood & affect appropriate.     Data Reviewed: I have personally reviewed following labs and imaging studies  CBC: Recent Labs  Lab 07/08/20 1329 07/10/20 0700 07/11/20 0818 07/12/20 0829  WBC 8.2 8.5 7.5 6.6  NEUTROABS 7.0  --   --   --   HGB 10.8* 10.3* 10.3* 10.9*  HCT 32.2* 30.8* 30.9* 32.9*  MCV 96.7 94.2 94.8 95.4  PLT 110* 105* 107* 841*   Basic Metabolic Panel: Recent Labs  Lab 07/08/20 1329 07/09/20 0440 07/10/20 0700 07/11/20 0818 07/12/20 0829  NA 138 138 138 138 139  K 5.2*  5.1 4.1 3.7 3.6  CL 107 106 103 101 102  CO2 19* 19* 21* 23 24  GLUCOSE 140* 148* 114* 97 116*  BUN 75* 83* 96* 93* 98*  CREATININE 3.82* 4.37* 4.65* 4.48* 4.28*  CALCIUM 8.2* 8.2* 8.7* 8.6* 8.7*  MG  --   --  2.1 2.1 2.1  PHOS  --   --  4.9* 4.5 4.6   GFR: Estimated Creatinine Clearance: 13.4 mL/min (A) (by C-G formula based on SCr of 4.28 mg/dL (H)). Liver Function Tests: Recent Labs  Lab 07/08/20 1329 07/09/20 0440 07/10/20 0700 07/11/20 0818 07/12/20 0829  AST 165* 290* 328* 135* 88*  ALT 178* 322* 583* 465* 351*  ALKPHOS 113 102 103 89 91  BILITOT 2.6* 2.5* 2.3* 2.1* 2.0*  PROT 6.5 6.3* 6.4* 6.2* 6.2*  ALBUMIN 3.7 3.5 3.5 3.3* 3.3*   No results for input(s): LIPASE, AMYLASE in the last 168 hours. No results for input(s): AMMONIA in the last 168 hours. Coagulation Profile: No results for input(s): INR, PROTIME in the last 168 hours. Cardiac Enzymes: No results for input(s): CKTOTAL, CKMB, CKMBINDEX, TROPONINI in the last 168 hours. BNP (last 3 results) No results for input(s): PROBNP in the last 8760 hours.  HbA1C: No results for input(s): HGBA1C in the last 72 hours. CBG: Recent Labs  Lab 07/11/20 1119 07/11/20 1701 07/11/20 2036 07/12/20 0744 07/12/20 1113  GLUCAP 153* 132* 122* 103* 101*   Lipid Profile: No results for input(s): CHOL, HDL, LDLCALC, TRIG, CHOLHDL, LDLDIRECT in the last 72 hours. Thyroid Function Tests: No results for input(s): TSH, T4TOTAL, FREET4, T3FREE, THYROIDAB in the last 72 hours. Anemia Panel: No results for input(s): VITAMINB12, FOLATE, FERRITIN, TIBC, IRON, RETICCTPCT in the last 72 hours. Sepsis Labs: No results for input(s): PROCALCITON, LATICACIDVEN in the last 168 hours.  Recent Results (from the past 240 hour(s))  Respiratory Panel by RT PCR (Flu A&B, Covid) - Nasopharyngeal Swab     Status: None   Collection Time: 07/08/20  1:35 PM   Specimen: Nasopharyngeal Swab  Result Value Ref Range Status   SARS Coronavirus 2 by  RT PCR NEGATIVE NEGATIVE Final    Comment: (NOTE) SARS-CoV-2 target nucleic acids are NOT DETECTED.  The SARS-CoV-2 RNA is generally detectable in upper respiratoy specimens during the acute phase of infection. The lowest concentration of SARS-CoV-2 viral copies this assay can detect is 131 copies/mL. A negative result does not preclude SARS-Cov-2 infection and should not be used as the sole basis for treatment or other patient management decisions. A negative result may occur with  improper specimen collection/handling, submission of specimen other than nasopharyngeal swab, presence of viral mutation(s) within the areas targeted by this assay, and inadequate number of viral copies (<131 copies/mL). A negative result must be combined with clinical observations, patient history, and epidemiological information. The expected result is Negative.  Fact Sheet for Patients:  PinkCheek.be  Fact Sheet for Healthcare Providers:  GravelBags.it  This test is no t yet approved or cleared by the [place] FDA and  has been authorized for detection and/or diagnosis of SARS-CoV-2 by FDA under an Emergency Use Authorization (EUA). This EUA will remain  in effect (meaning this test can be used) for the duration of the COVID-19 declaration under Section 564(b)(1) of the Act, 21 U.S.C. section 360bbb-3(b)(1), unless the authorization is terminated or revoked sooner.     Influenza A by PCR NEGATIVE NEGATIVE Final   Influenza B by PCR NEGATIVE NEGATIVE Final    Comment: (NOTE) The Xpert Xpress SARS-CoV-2/FLU/RSV assay is intended as an aid in  the diagnosis of influenza from Nasopharyngeal swab specimens and  should not be used as a sole basis for treatment. Nasal washings and  aspirates are unacceptable for Xpert Xpress SARS-CoV-2/FLU/RSV  testing.  Fact Sheet for Patients: PinkCheek.be  Fact Sheet for  Healthcare Providers: GravelBags.it  This test is not yet approved or cleared by the [place] FDA and  has been authorized for detection and/or diagnosis of SARS-CoV-2 by  FDA under an Emergency Use Authorization (EUA). This EUA will remain  in effect (meaning this test can be used) for the duration of the  Covid-19 declaration under Section 564(b)(1) of the Act, 21  U.S.C. section 360bbb-3(b)(1), unless the authorization is  terminated or revoked. Performed at [facility], [address] [place], [place] [address]   MRSA PCR Screening     Status: None   Collection Time: 07/09/20  8:45 PM   Specimen: Nasal Mucosa; Nasopharyngeal  Result Value Ref Range Status   MRSA by PCR NEGATIVE NEGATIVE Final    Comment:        The GeneXpert MRSA Assay (FDA approved for NASAL specimens only), is one component of a comprehensive  MRSA colonization surveillance program. It is not intended to diagnose MRSA infection nor to guide or monitor treatment for MRSA infections. Performed at [facility], [address] [place], [place] [address]      Radiology Studies: US RENAL  Result Date: 07/11/2020 CLINICAL DATA:  Acute kidney injury. EXAM: RENAL / URINARY TRACT ULTRASOUND COMPLETE COMPARISON:  03/14/2017. FINDINGS: Right Kidney: Renal measurements: 8.5 x 4.6 x 3.5 cm = volume: 72 mL. Diffusely increased echogenicity without hydronephrosis. Left Kidney: Renal measurements: 9.8 x 5.4 x 4.5 cm = volume: 126 mL. Echogenic parenchyma with 2.9 cm cystic lesion interpolar region not substantially changed from 2.6 cm on ultrasound exam of 03/14/2017. Bladder: Appears normal for degree of bladder distention. Other: None. IMPRESSION: 1. No acute findings. No hydronephrosis. 2. 2.9 cm cyst interpolar region left kidney not substantially changed since 2018. 3. Increased echogenicity of both kidneys compatible with medical renal disease. Electronically Signed   By: Misty Stanley  M.D.   On: 07/11/2020 10:53   Scheduled Meds: . [START ON 07/13/2020] aspirin EC  81 mg Oral Daily  . calcitRIOL  0.25 mcg Oral Q M,W,F  . citalopram  10 mg Oral Daily  . doxazosin  8 mg Oral Daily  . enoxaparin (LOVENOX) injection  30 mg Subcutaneous Q24H  . hydrALAZINE  50 mg Oral TID  . insulin aspart  0-15 Units Subcutaneous TID WC  . insulin aspart  0-5 Units Subcutaneous QHS  . isosorbide mononitrate  30 mg Oral Daily  . metoprolol tartrate  25 mg Oral BID  . pantoprazole  40 mg Oral BID  . sodium bicarbonate  1,950 mg Oral Daily  . sodium chloride flush  3 mL Intravenous Q12H  . sodium chloride flush  3 mL Intravenous Q12H   Continuous Infusions: . sodium chloride    . sodium chloride    . sodium chloride 70 mL/hr at 07/12/20 0640     LOS: 4 days    Time spent: 25 mins.    Shawna Clamp, MD [facility]   If 7PM-7AM, please contact night-coverage

## 2020-07-13 ENCOUNTER — Encounter (HOSPITAL_COMMUNITY): Payer: Self-pay | Admitting: Interventional Cardiology

## 2020-07-13 DIAGNOSIS — I2510 Atherosclerotic heart disease of native coronary artery without angina pectoris: Secondary | ICD-10-CM

## 2020-07-13 DIAGNOSIS — R7989 Other specified abnormal findings of blood chemistry: Secondary | ICD-10-CM

## 2020-07-13 LAB — LIPID PANEL
Cholesterol: 104 mg/dL (ref 0–200)
HDL: 25 mg/dL — ABNORMAL LOW (ref >40)
LDL Cholesterol: 67 mg/dL (ref 0–99)
Total CHOL/HDL Ratio: 4.2 RATIO
Triglycerides: 62 mg/dL (ref <150)
VLDL: 12 mg/dL (ref 0–40)

## 2020-07-13 LAB — COMPREHENSIVE METABOLIC PANEL
ALT: 288 U/L — ABNORMAL HIGH (ref 0–44)
AST: 70 U/L — ABNORMAL HIGH (ref 15–41)
Albumin: 3 g/dL — ABNORMAL LOW (ref 3.5–5.0)
Alkaline Phosphatase: 94 U/L (ref 38–126)
Anion gap: 11 (ref 5–15)
BUN: 88 mg/dL — ABNORMAL HIGH (ref 8–23)
CO2: 25 mmol/L (ref 22–32)
Calcium: 9 mg/dL (ref 8.9–10.3)
Chloride: 105 mmol/L (ref 98–111)
Creatinine, Ser: 3.85 mg/dL — ABNORMAL HIGH (ref 0.61–1.24)
GFR, Estimated: 14 mL/min — ABNORMAL LOW (ref >60)
Glucose, Bld: 160 mg/dL — ABNORMAL HIGH (ref 70–99)
Potassium: 4 mmol/L (ref 3.5–5.1)
Sodium: 141 mmol/L (ref 135–145)
Total Bilirubin: 1.2 mg/dL (ref 0.3–1.2)
Total Protein: 5.8 g/dL — ABNORMAL LOW (ref 6.5–8.1)

## 2020-07-13 LAB — CBC
HCT: 31.9 % — ABNORMAL LOW (ref 39.0–52.0)
Hemoglobin: 10.4 g/dL — ABNORMAL LOW (ref 13.0–17.0)
MCH: 31.5 pg (ref 26.0–34.0)
MCHC: 32.6 g/dL (ref 30.0–36.0)
MCV: 96.7 fL (ref 80.0–100.0)
Platelets: 133 10*3/uL — ABNORMAL LOW (ref 150–400)
RBC: 3.3 MIL/uL — ABNORMAL LOW (ref 4.22–5.81)
RDW: 14.4 % (ref 11.5–15.5)
WBC: 5.7 10*3/uL (ref 4.0–10.5)
nRBC: 0 % (ref 0.0–0.2)

## 2020-07-13 LAB — GLUCOSE, CAPILLARY
Glucose-Capillary: 97 mg/dL (ref 70–99)
Glucose-Capillary: 218 mg/dL — ABNORMAL HIGH (ref 70–99)
Glucose-Capillary: 80 mg/dL (ref 70–99)

## 2020-07-13 MED ORDER — TORSEMIDE 20 MG PO TABS: 60.0000 mg | ORAL_TABLET | Freq: Every day | ORAL | 0 refills | Status: DC

## 2020-07-13 MED ORDER — HYDRALAZINE HCL 50 MG PO TABS: 75.0000 mg | ORAL_TABLET | Freq: Three times a day (TID) | ORAL | Status: DC

## 2020-07-13 MED ORDER — AMIODARONE HCL 200 MG PO TABS: 200.0000 mg | ORAL_TABLET | Freq: Two times a day (BID) | ORAL | 0 refills | Status: DC

## 2020-07-13 MED ORDER — ISOSORBIDE MONONITRATE ER 30 MG PO TB24: 30.0000 mg | ORAL_TABLET | Freq: Every day | ORAL | 0 refills | Status: DC

## 2020-07-13 MED ORDER — HYDRALAZINE HCL 50 MG PO TABS
75.0000 mg | ORAL_TABLET | Freq: Three times a day (TID) | ORAL | 0 refills | Status: DC
Start: 2020-07-13 — End: 2020-08-02

## 2020-07-13 MED ORDER — POTASSIUM CHLORIDE ER 20 MEQ PO TBCR: 10.0000 meq | EXTENDED_RELEASE_TABLET | Freq: Every day | ORAL | 0 refills | Status: DC

## 2020-07-13 MED FILL — Heparin Sod (Porcine)-NaCl IV Soln 1000 Unit/500ML-0.9%: INTRAVENOUS | Qty: 500 | Status: AC

## 2020-07-13 NOTE — Discharge Summary (Signed)
Physician Discharge Summary  Braidan ESVIN HNAT NAT:[id number] DOB: 1946-03-22 DOA: 07/08/2020  PCP: Susy Frizzle, MD  Admit date: 07/08/2020 Discharge date: 07/13/2020  Recommendations for Outpatient Follow-up:  1. Have chemistry drawn on 07/16/2020 and reported to PCP, Dr. Theador Hawthorne. 2. Follow up with PCP in 7-10 days. Have chemistry drawn on that visit as well. 3. Follow up with Dr. Theador Hawthorne in 7-10 days. 4. Follow up with cardiology as directed. Follow up with heart failure clinic as directed. 5. Weigh self daily. Call cardiology for weight gain of 3 lbs or more in one day or 5 lbs or more in one week.   Follow-up Information    [facility] Follow up on 07/28/2020.   Specialty: Cardiology Why: 09:20 with Dr Haroldine Laws . Located on the 1st floor at [facility]. [address]  Contact information: [address] [id number] [place]       Thompson Grayer, MD Follow up on 07/18/2020.   Specialty: Cardiology Why: 0800  Contact information: [place] [address] [phone]                Discharge Diagnoses: Principal diagnosis is #1 1. 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 CAD/History of CABG 3. DM II with CKD IV 4. AKI on CKD IV 5. Elevated LFT's due to passive congestion from heart failure 6. GERD  Discharge Condition: Fair  Disposition: Home  Diet recommendation: Heart healthy with modified carbohydrates.  Filed Weights   07/11/20 0458 07/12/20 0645 07/13/20 0429  Weight: 67.9 kg 64.3 kg 64.3 kg    History of present illness: Bradley Black is a 74 y.o. male with a history of diabetes with retinopathy, coronary artery disease status post CABG, GERD, hypertension, left carotid artery stenosis, history of systolic chronic heart failure with diffuse hypokinesis on echocardiogram and 10/2018.  Patient presents with  shortness of breath that started last night and worsened this morning.  Shortness of breath worsened with laying down flat.  He requires 2 pillows to sleep although needed to sit up in bed last night to sleep.  Shortness of breath worse with activity as well and improves with rest.  Denies leg edema, but notes that his abdomen swells periodically.   In the ED the chest x-ray showed cardiomegaly with mild pulmonary edema.  BNP greater than 4500.  High-sensitivity troponin was stable at 36 and 34 with a reading 2 hours apart.  Creatinine 3.82.  AST 165, ALT 178.   Pt denies any fevers, chills, nausea, vomiting, diarrhea, constipation, abdominal pain, shortness of breath, dyspnea on exertion, orthopnea, cough, wheezing, palpitations, headache, vision changes, lightheadedness, dizziness, melena, rectal bleeding.  Review of systems are otherwise negative  Hospital Course:  Patient is admitted for acute on chronic combined heart failure exacerbation.  Cardiology consulted recommended continue IV Lasix,  Nephrology consulted given worsening renal functions. Patient is not a good candidate for cardiac catheterization in view of contrast nephropathy , advised medical optimization initially, Repeat echocardiogram shows EF significantly reduced below 25%. Cardiology reevaluation recommended left and right heart catheterization, Patient is being transferred to [facility].  Patient may require hemodialysis post cardiac catheterization given worsening renal functions.  Heart failure team has to be consulted. His medications have been adjusted and he has been cleared for discharge by Dr. Tempie Hoist.  Today's assessment: S: The patient is resting comfortably. No new complaints. O: Vitals:  Vitals:   07/13/20 0810 07/13/20 1445  BP: (!) 143/58 (!) 121/54  Pulse: (!) 55 (!) 49  Resp: 20 20  Temp: 98.1 F (36.7 C) 97.8 F (36.6 C)  SpO2: 96% 97%    Exam:  Constitutional:  . The patient is awake, alert, and  oriented x 3. No acute distress. Respiratory:  . No increased work of breathing. . No wheezes, rales, or rhonchi . No tactile fremitus Cardiovascular:  . Regular rate and rhythm . No murmurs, ectopy, or gallups. . No lateral PMI. No thrills. Abdomen:  . Abdomen is soft, non-tender, non-distended . No hernias, masses, or organomegaly . Normoactive bowel sounds.  Musculoskeletal:  . No cyanosis, clubbing, or edema Skin:  . No rashes, lesions, ulcers . palpation of skin: no induration or nodules Neurologic:  . CN 2-12 intact . Sensation all 4 extremities intact Psychiatric:  . Mental status o Mood, affect appropriate o Orientation to person, place, time  . judgment and insight appear intact   Discharge Instructions  Discharge Instructions    (HEART FAILURE PATIENTS) Call MD:  Anytime you have any of the following symptoms: 1) 3 pound weight gain in 24 hours or 5 pounds in 1 week 2) shortness of breath, with or without a dry hacking cough 3) swelling in the hands, feet or stomach 4) if you have to sleep on extra pillows at night in order to breathe.   Complete by: As directed    Activity as tolerated - No restrictions   Complete by: As directed    Call MD for:  difficulty breathing, headache or visual disturbances   Complete by: As directed    Call MD for:  extreme fatigue   Complete by: As directed    Call MD for:  persistant dizziness or light-headedness   Complete by: As directed    Call MD for:  redness, tenderness, or signs of infection (pain, swelling, redness, odor or green/yellow discharge around incision site)   Complete by: As directed    Call MD for:  temperature >100.4   Complete by: As directed    Diet - low sodium heart healthy   Complete by: As directed    Discharge instructions   Complete by: As directed    Have chemistry drawn on 07/16/2020 and reported to PCP, Dr. Theador Hawthorne. Follow up with PCP in 7-10 days. Have chemistry drawn on that visit as  well. Follow up with Dr. Theador Hawthorne in 7-10 days. Follow up with cardiology as directed. Follow up with heart failure clinic as directed. Weigh self daily. Call cardiology for weight gain of 3 lbs or more in one day or 5 lbs or more in one week.   Increase activity slowly   Complete by: As directed      Allergies as of 07/13/2020   No Known Allergies     Medication List    STOP taking these medications   furosemide 20 MG tablet Commonly known as: LASIX   metoprolol tartrate 25 MG tablet Commonly known as: LOPRESSOR   olmesartan 5 MG tablet Commonly known as: BENICAR   pravastatin 20 MG tablet Commonly known as: PRAVACHOL     TAKE these medications   acetaminophen 500 MG tablet Commonly known as: TYLENOL Take 1,000 mg by mouth every 6 (six) hours as needed for moderate pain or headache.   amiodarone 200 MG tablet Commonly known as: PACERONE Take 1 tablet (200 mg total) by mouth 2 (two) times daily.   aspirin EC 81  MG tablet Take 81 mg by mouth daily.   calcitRIOL 0.25 MCG capsule Commonly known as: ROCALTROL Take 0.25 mcg by mouth every Monday, Wednesday, and Friday.   cinacalcet 30 MG tablet Commonly known as: SENSIPAR Take 30 mg by mouth daily with breakfast.   citalopram 10 MG tablet Commonly known as: CELEXA TAKE ONE (1) TABLET BY MOUTH EVERY DAY What changed: See the new instructions.   doxazosin 8 MG tablet Commonly known as: CARDURA Take 1 tablet (8 mg total) by mouth daily.   hydrALAZINE 50 MG tablet Commonly known as: APRESOLINE Take 1.5 tablets (75 mg total) by mouth 3 (three) times daily. What changed:   how much to take  additional instructions   isosorbide mononitrate 30 MG 24 hr tablet Commonly known as: IMDUR Take 1 tablet (30 mg total) by mouth daily. Start taking on: July 14, 2020   pantoprazole 40 MG tablet Commonly known as: PROTONIX TAKE ONE TABLET BY MOUTH TWICE A DAY   Potassium Chloride ER 20 MEQ Tbcr Take 10 mEq by  mouth daily.   sildenafil 100 MG tablet Commonly known as: Viagra Take 0.5-1 tablets (50-100 mg total) by mouth daily as needed for erectile dysfunction.   sitaGLIPtin 50 MG tablet Commonly known as: JANUVIA Take 25 mg by mouth daily.   sodium bicarbonate 650 MG tablet Take 1,950 mg by mouth daily.   torsemide 20 MG tablet Commonly known as: Demadex Take 3 tablets (60 mg total) by mouth daily.      No Known Allergies  The results of significant diagnostics from this hospitalization (including imaging, microbiology, ancillary and laboratory) are listed below for reference.    Significant Diagnostic Studies: CARDIAC CATHETERIZATION  Result Date: 07/12/2020  Mildly elevated mean pulmonary artery pressure at 21 mmHg.  This is consistent with mild pulmonary hypertension.  Pulmonary capillary wedge pressure mean is 11 mmHg.  Cardiac output 4.6 L/min.  Main pulmonary artery O2 saturation 64%.  Arterial pulse oximetry O2 saturation 99%.  Pulmonary vascular resistance 2.16 Wood units.  US RENAL  Result Date: 07/11/2020 CLINICAL DATA:  Acute kidney injury. EXAM: RENAL / URINARY TRACT ULTRASOUND COMPLETE COMPARISON:  03/14/2017. FINDINGS: Right Kidney: Renal measurements: 8.5 x 4.6 x 3.5 cm = volume: 72 mL. Diffusely increased echogenicity without hydronephrosis. Left Kidney: Renal measurements: 9.8 x 5.4 x 4.5 cm = volume: 126 mL. Echogenic parenchyma with 2.9 cm cystic lesion interpolar region not substantially changed from 2.6 cm on ultrasound exam of 03/14/2017. Bladder: Appears normal for degree of bladder distention. Other: None. IMPRESSION: 1. No acute findings. No hydronephrosis. 2. 2.9 cm cyst interpolar region left kidney not substantially changed since 2018. 3. Increased echogenicity of both kidneys compatible with medical renal disease. Electronically Signed   By: Misty Stanley M.D.   On: 07/11/2020 10:53   DG Chest Port 1 View  Result Date: 07/08/2020 CLINICAL DATA:   Shortness of breath EXAM: PORTABLE CHEST 1 VIEW COMPARISON:  01/21/2012, 12/31/2011 FINDINGS: Post CABG changes. Stable mild cardiomegaly. There is mild pulmonary vascular congestion and prominent interstitial markings. No large pleural fluid collection. No pneumothorax. IMPRESSION: Findings suggestive of CHF with mild interstitial edema. Electronically Signed   By: Davina Poke D.O.   On: 07/08/2020 14:12   ECHOCARDIOGRAM COMPLETE  Result Date: 07/11/2020    ECHOCARDIOGRAM REPORT   Patient Name:   [place] Date of Exam: 07/09/2020 Medical Rec #:  [id number]     Height:       68.0 in Accession #:  [id number]    Weight:       160.0 lb Date of Birth:  March 01, 1946     BSA:          1.859 m Patient Age:    74 years      BP:           172/96 mmHg Patient Gender: M             HR:           61 bpm. Exam Location:  [facility] Procedure: 2D Echo Indications:    CHF-Acute Systolic  History:        Patient has prior history of Echocardiogram examinations, most                 recent 10/27/2018. CAD, Prior CABG; Risk Factors:Hypertension,                 Diabetes and Dyslipidemia.  Sonographer:    Mikki Santee RDCS (AE) Referring Phys: [place]  1. Compared to previous echo, LVEF and RVEF are worse . Left ventricular ejection fraction, by estimation, is 25%%. The left ventricle has severely decreased function. The left ventricle demonstrates global hypokinesis. The left ventricular internal cavity size was moderately dilated. Left ventricular diastolic parameters are consistent with Grade III diastolic dysfunction (restrictive). Elevated left atrial pressure.  2. Right ventricular systolic function is severely reduced. The right ventricular size is mildly enlarged. There is moderately elevated pulmonary artery systolic pressure.  3. Left atrial size was severely dilated.  4. Right atrial size was moderately dilated.  5. The mitral valve is normal in structure. Mild to moderate mitral  valve regurgitation.  6. The aortic valve is tricuspid. Aortic valve regurgitation is trivial.  7. The inferior vena cava is dilated in size with <50% respiratory variability, suggesting right atrial pressure of 15 mmHg. FINDINGS  Left Ventricle: Compared to previous echo, LVEF and RVEF are worse. Left ventricular ejection fraction, by estimation, is 25%%. The left ventricle has severely decreased function. The left ventricle demonstrates global hypokinesis. The left ventricular internal cavity size was moderately dilated. There is no left ventricular hypertrophy. Left ventricular diastolic parameters are consistent with Grade III diastolic dysfunction (restrictive). Elevated left atrial pressure. Right Ventricle: The right ventricular size is mildly enlarged. No increase in right ventricular wall thickness. Right ventricular systolic function is severely reduced. There is moderately elevated pulmonary artery systolic pressure. The tricuspid regurgitant velocity is 3.01 m/s, and with an assumed right atrial pressure of 15 mmHg, the estimated right ventricular systolic pressure is 84.5 mmHg. Left Atrium: Left atrial size was severely dilated. Right Atrium: Right atrial size was moderately dilated. Pericardium: There is no evidence of pericardial effusion. Mitral Valve: The mitral valve is normal in structure. Mild to moderate mitral valve regurgitation. Tricuspid Valve: The tricuspid valve is normal in structure. Tricuspid valve regurgitation is mild. Aortic Valve: The aortic valve is tricuspid. Aortic valve regurgitation is trivial. Pulmonic Valve: The pulmonic valve was not well visualized. Pulmonic valve regurgitation is not visualized. No evidence of pulmonic stenosis. Aorta: The aortic root is normal in size and structure. Venous: The inferior vena cava is dilated in size with less than 50% respiratory variability, suggesting right atrial pressure of 15 mmHg. IAS/Shunts: The interatrial septum was not assessed.   LEFT VENTRICLE PLAX 2D LVIDd:         5.69 cm  Diastology LVIDs:         5.12 cm  LV e' medial:    3.00 cm/s LV PW:         1.03 cm  LV E/e' medial:  25.1 LV IVS:        1.02 cm  LV e' lateral:   5.35 cm/s LVOT diam:     2.10 cm  LV E/e' lateral: 14.1 LVOT Area:     3.46 cm  RIGHT VENTRICLE RV S prime:     4.50 cm/s TAPSE (M-mode): 0.6 cm LEFT ATRIUM            Index       RIGHT ATRIUM           Index LA diam:      5.10 cm  2.74 cm/m  RA Area:     23.10 cm LA Vol (A4C): 105.0 ml 56.49 ml/m RA Volume:   69.30 ml  37.28 ml/m   AORTA Ao Root diam: 3.40 cm MITRAL VALVE               TRICUSPID VALVE MV Area (PHT): 3.48 cm    TR Peak grad:   36.2 mmHg MV Decel Time: 218 msec    TR Vmax:        301.00 cm/s MR Peak grad: 95.6 mmHg MR Mean grad: 61.0 mmHg    SHUNTS MR Vmax:      489.00 cm/s  Systemic Diam: 2.10 cm MR Vmean:     370.0 cm/s MV E velocity: 75.30 cm/s MV A velocity: 45.90 cm/s MV E/A ratio:  1.64 Dorris Carnes MD Electronically signed by Dorris Carnes MD Signature Date/Time: 07/11/2020/11:23:09 AM    Final    US Abdomen Limited RUQ  Result Date: 07/09/2020 CLINICAL DATA:  Elevated liver function tests. EXAM: ULTRASOUND ABDOMEN LIMITED RIGHT UPPER QUADRANT COMPARISON:  None. FINDINGS: Gallbladder: No gallstones or wall thickening visualized. No sonographic Murphy sign noted by sonographer. Common bile duct: Diameter: Normal, 6 mm. Liver: No focal lesion identified. Within normal limits in parenchymal echogenicity. Portal vein is patent on color Doppler imaging with normal direction of blood flow towards the liver. Other: Perihepatic small volume ascites. Right-sided pleural effusion. Increased echogenicity within the right kidney incidentally noted. IMPRESSION: No acute process or explanation for elevated liver function tests. Small volume ascites and right pleural effusion, suggesting fluid overload. Increased renal echogenicity, likely related to medical renal disease. Electronically Signed   By: Abigail Miyamoto M.D.   On: 07/09/2020 11:44    Microbiology: Recent Results (from the past 240 hour(s))  Respiratory Panel by RT PCR (Flu A&B, Covid) - Nasopharyngeal Swab     Status: None   Collection Time: 07/08/20  1:35 PM   Specimen: Nasopharyngeal Swab  Result Value Ref Range Status   SARS Coronavirus 2 by RT PCR NEGATIVE NEGATIVE Final    Comment: (NOTE) SARS-CoV-2 target nucleic acids are NOT DETECTED.  The SARS-CoV-2 RNA is generally detectable in upper respiratoy specimens during the acute phase of infection. The lowest concentration of SARS-CoV-2 viral copies this assay can detect is 131 copies/mL. A negative result does not preclude SARS-Cov-2 infection and should not be used as the sole basis for treatment or other patient management decisions. A negative result may occur with  improper specimen collection/handling, submission of specimen other than nasopharyngeal swab, presence of viral mutation(s) within the areas targeted by this assay, and inadequate number of viral copies (<131 copies/mL). A negative result must be combined with clinical observations, patient history, and epidemiological information. The expected result is Negative.  Fact Sheet for Patients:  PinkCheek.be  Fact Sheet for Healthcare Providers:  GravelBags.it  This test is no t yet approved or cleared by the [place] FDA and  has been authorized for detection and/or diagnosis of SARS-CoV-2 by FDA under an Emergency Use Authorization (EUA). This EUA will remain  in effect (meaning this test can be used) for the duration of the COVID-19 declaration under Section 564(b)(1) of the Act, 21 U.S.C. section 360bbb-3(b)(1), unless the authorization is terminated or revoked sooner.     Influenza A by PCR NEGATIVE NEGATIVE Final   Influenza B by PCR NEGATIVE NEGATIVE Final    Comment: (NOTE) The Xpert Xpress SARS-CoV-2/FLU/RSV assay is intended as an  aid in  the diagnosis of influenza from Nasopharyngeal swab specimens and  should not be used as a sole basis for treatment. Nasal washings and  aspirates are unacceptable for Xpert Xpress SARS-CoV-2/FLU/RSV  testing.  Fact Sheet for Patients: PinkCheek.be  Fact Sheet for Healthcare Providers: GravelBags.it  This test is not yet approved or cleared by the [place] FDA and  has been authorized for detection and/or diagnosis of SARS-CoV-2 by  FDA under an Emergency Use Authorization (EUA). This EUA will remain  in effect (meaning this test can be used) for the duration of the  Covid-19 declaration under Section 564(b)(1) of the Act, 21  U.S.C. section 360bbb-3(b)(1), unless the authorization is  terminated or revoked. Performed at [facility], [address], [place], [place] [address]   MRSA PCR Screening     Status: None   Collection Time: 07/09/20  8:45 PM   Specimen: Nasal Mucosa; Nasopharyngeal  Result Value Ref Range Status   MRSA by PCR NEGATIVE NEGATIVE Final    Comment:        The GeneXpert MRSA Assay (FDA approved for NASAL specimens only), is one component of a comprehensive MRSA colonization surveillance program. It is not intended to diagnose MRSA infection nor to guide or monitor treatment for MRSA infections. Performed at [facility] [address], [place], [place] [address]      Labs: Basic Metabolic Panel: Recent Labs  Lab 07/09/20 0440 07/09/20 0440 07/10/20 0700 07/10/20 0700 07/11/20 0818 07/12/20 0829 07/12/20 1543 07/12/20 1544 07/13/20 0118  NA 138   < > 138   < > 138 139 142 142 141  K 5.1   < > 4.1   < > 3.7 3.6 4.3 4.3 4.0  CL 106  --  103  --  101 102  --   --  105  CO2 19*  --  21*  --  23 24  --   --  25  GLUCOSE 148*  --  114*  --  97 116*  --   --  160*  BUN 83*  --  96*  --  93* 98*  --   --  88*  CREATININE 4.37*  --  4.65*  --  4.48* 4.28*  --   --  3.85*   CALCIUM 8.2*  --  8.7*  --  8.6* 8.7*  --   --  9.0  MG  --   --  2.1  --  2.1 2.1  --   --   --   PHOS  --   --  4.9*  --  4.5 4.6  --   --   --    < > = values in this interval not displayed.   Liver Function Tests: Recent Labs  Lab 07/09/20  8675 07/10/20 0700 07/11/20 0818 07/12/20 0829 07/13/20 0118  AST 290* 328* 135* 88* 70*  ALT 322* 583* 465* 351* 288*  ALKPHOS 102 103 89 91 94  BILITOT 2.5* 2.3* 2.1* 2.0* 1.2  PROT 6.3* 6.4* 6.2* 6.2* 5.8*  ALBUMIN 3.5 3.5 3.3* 3.3* 3.0*   No results for input(s): LIPASE, AMYLASE in the last 168 hours. No results for input(s): AMMONIA in the last 168 hours. CBC: Recent Labs  Lab 07/08/20 1329 07/08/20 1329 07/10/20 0700 07/10/20 0700 07/11/20 0818 07/12/20 0829 07/12/20 1543 07/12/20 1544 07/13/20 0118  WBC 8.2  --  8.5  --  7.5 6.6  --   --  5.7  NEUTROABS 7.0  --   --   --   --   --   --   --   --   HGB 10.8*   < > 10.3*   < > 10.3* 10.9* 10.9* 10.9* 10.4*  HCT 32.2*   < > 30.8*   < > 30.9* 32.9* 32.0* 32.0* 31.9*  MCV 96.7  --  94.2  --  94.8 95.4  --   --  96.7  PLT 110*  --  105*  --  107* 125*  --   --  133*   < > = values in this interval not displayed.   Cardiac Enzymes: No results for input(s): CKTOTAL, CKMB, CKMBINDEX, TROPONINI in the last 168 hours. BNP: BNP (last 3 results) Recent Labs    07/08/20 1329  BNP >4,500.0*    ProBNP (last 3 results) No results for input(s): PROBNP in the last 8760 hours.  CBG: Recent Labs  Lab 07/12/20 1618 07/12/20 2125 07/13/20 0620 07/13/20 1129 07/13/20 1615  GLUCAP 110* 208* 97 218* 80    Principal Problem:   Biventricular heart failure (HCC) Active Problems:   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   Essential hypertension   Type 2 diabetes mellitus with diabetic chronic kidney disease (HCC)   Anemia in chronic kidney disease   Chronic kidney disease, stage IV (severe) (HCC)   GERD (gastroesophageal reflux disease)   SOB (shortness of breath)    Elevated LFTs   Frequent PVCs   Acute renal failure superimposed on stage 4 chronic kidney disease ([place])   Time coordinating discharge: 38 minutes.  Signed:        Monisha Siebel, DO [facility]  07/13/2020, 7:14 PM

## 2020-07-13 NOTE — Evaluation (Addendum)
Physical Therapy Evaluation Patient Details Name: Bradley Black MRN: [id number] DOB: 25-Nov-1945 Today's Date: 07/13/2020   History of Present Illness  Pt is 74 yo male with pmh including cardiomyopathy, CAD with CABG in 2013, CKD, DM2, HTN, HLD, and frequent PVCs.  Pt presented to ED with shortness of breath.  Pt admitted for acute on chronic CHF.  Pt had cardiac cathertization on10/12/21 and also nephrology consult due to worsenign renal function.  Clinical Impression  Pt admitted with above diagnosis. Pt did well during PT evaluation with all VSS.  He was able to ambulate in the hall without RW.  He did demonstrate mild instability with dynamic gait task and slow gait speed.  Pt normally very independent and active.  Pt does have assistive devices at home if needed and 24 hr support from his wife.  Discussed outpt PT vs no therapy and progressing on his own - pt preferred to do so on his own and is familiar with exercises.  Pt currently with functional limitations due to the deficits listed below (see PT Problem List). Pt will benefit from skilled PT to increase their independence and safety with mobility to allow discharge to the venue listed below.       Follow Up Recommendations No PT follow up;Supervision - Intermittent    Equipment Recommendations  None recommended by PT    Recommendations for Other Services       Precautions / Restrictions Precautions Precautions: Fall      Mobility  Bed Mobility Overal bed mobility: Needs Assistance Bed Mobility: Supine to Sit;Sit to Supine     Supine to sit: Supervision Sit to supine: Supervision      Transfers Overall transfer level: Needs assistance Equipment used: None Transfers: Sit to/from Stand Sit to Stand: Min guard         General transfer comment: min guard for safety  Ambulation/Gait Ambulation/Gait assistance: Min guard Gait Distance (Feet): 300 Feet Assistive device: None Gait Pattern/deviations: Step-through  pattern;Decreased stride length Gait velocity: decreased   General Gait Details: generally steady but did have mild instability with dynamic task including looking R/L and stepping over object; slow speed  Stairs            Wheelchair Mobility    Modified Rankin (Stroke Patients Only)       Balance Overall balance assessment: Needs assistance Sitting-balance support: No upper extremity supported;Feet supported Sitting balance-Leahy Scale: Normal     Standing balance support: No upper extremity supported;During functional activity Standing balance-Leahy Scale: Good                 High Level Balance Comments: Mild instability but no formal LOB with head turns and stepping over object             Pertinent Vitals/Pain Pain Assessment: 0-10 Pain Score: 4  Pain Location: back-chronic Pain Descriptors / Indicators: Aching Pain Intervention(s): Limited activity within patient's tolerance;Monitored during session;Repositioned    Home Living Family/patient expects to be discharged to:: Private residence Living Arrangements: Spouse/significant other Available Help at Discharge: Family;Available 24 hours/day Type of Home: House Home Access: Stairs to enter;Ramped entrance Entrance Stairs-Rails: Right;Left Entrance Stairs-Number of Steps: 5 Home Layout: One level Home Equipment: Walker - 2 wheels;Cane - single point      Prior Function Level of Independence: Independent         Comments: Pt independent with ADLS, IADLs, driving, and works in yard     [employer]  Extremity/Trunk Assessment   Upper Extremity Assessment Upper Extremity Assessment: Overall WFL for tasks assessed    Lower Extremity Assessment Lower Extremity Assessment: Overall WFL for tasks assessed    Cervical / Trunk Assessment Cervical / Trunk Assessment: Normal  Communication      Cognition Arousal/Alertness: Awake/alert Behavior During Therapy: WFL for tasks  assessed/performed Overall Cognitive Status: Within Functional Limits for tasks assessed                                        General Comments General comments (skin integrity, edema, etc.): VSS .  Discussed with pt and wife that he is safe to ambulate/transer in room with family but if wanting to walk in hall ask for staff in case he fatigues; discussed outpt PT vs progressing on his own and pt prefers on his own and can seek outpt in future via PCP if needed; educated on HEP including sit to stands, counter hold hip ROM and heel raises.    Exercises     Assessment/Plan    PT Assessment Patient needs continued PT services  PT Problem List Decreased strength;Decreased balance;Decreased mobility;Cardiopulmonary status limiting activity;Decreased activity tolerance       PT Treatment Interventions DME instruction;Functional mobility training;Balance training;Patient/family education;Gait training;Therapeutic activities;Stair training;Therapeutic exercise    PT Goals (Current goals can be found in the Care Plan section)  Acute Rehab PT Goals Patient Stated Goal: return home PT Goal Formulation: With patient Time For Goal Achievement: 07/27/20 Potential to Achieve Goals: Good Additional Goals Additional Goal #1: Pt will score >19 on DGI to indicate low fall risk    Frequency Min 3X/week   Barriers to discharge        Co-evaluation               AM-PAC PT "6 Clicks" Mobility  Outcome Measure Help needed turning from your back to your side while in a flat bed without using bedrails?: None Help needed moving from lying on your back to sitting on the side of a flat bed without using bedrails?: None Help needed moving to and from a bed to a chair (including a wheelchair)?: None Help needed standing up from a chair using your arms (e.g., wheelchair or bedside chair)?: None Help needed to walk in hospital room?: A Little Help needed climbing 3-5 steps with a  railing? : A Little 6 Click Score: 22    End of Session Equipment Utilized During Treatment: Gait belt Activity Tolerance: Patient tolerated treatment well Patient left: in bed;with family/visitor present (sitting EOB) Nurse Communication: Mobility status PT Visit Diagnosis: Other abnormalities of gait and mobility (R26.89)    Time: 4656-8127 PT Time Calculation (min) (ACUTE ONLY): 15 min   Charges:   PT Evaluation $PT Eval Low Complexity: 1 Low          Ilia Dimaano, PT Acute Rehab Services Pager [phone] [facility] [phone]    Karlton Lemon 07/13/2020, 3:51 PM

## 2020-07-13 NOTE — Consult Note (Addendum)
Advanced Heart Failure Team Consult Note   Primary Physician: Susy Frizzle, MD PCP-Cardiologist:  No primary care provider on file.  Reason for Consultation: Heart Failure   HPI:    Bradley Black is seen today for evaluation of heart failure  at the request of Dr Bradley Black.   Bradley Black is a 74 year old with a history of CAD s/p CABG (PTCA of [place] b. PCI with BMS to LAD in 1997 c. RCA PCI [place] d. 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    Saw Dr Rayann Heman in September for frequent PVCs. Home monitor showed PVC burden 20%. PVC ablation was not felt to be indicated by Dr. Rayann Heman and was switched from diltiazem to metoprolol.  Presented [facility] with increased shortness of breath and abdominal bloating. CXR with interstitial edema. HS trop 34>26 . Creatinine > 3 and K 5.2 on admit. Benicar stopped. Cardiology and Nephrology consulted. Renal US negative for hydronephrosis. Transferred to [facility] for cath.   Nephrology planning for AV fistula as an outpatient.   RHC 07/11/20  RA 3 PA 39/6 (21)  PCWP 11 CO 4.6 CI 2.56    Lab Results  Component Value Date   CREATININE 3.85 (H) 07/13/2020   CREATININE 4.28 (H) 07/12/2020   CREATININE 4.48 (H) 07/11/2020   RHC 07/12/20  RA 3 PA 39/8 (21)  PCWP 11 CO 4.6 CO 2/6   Echo 07/2020 EF 25% RV severely reduced, grade III DD Echo 2020 EF 45% RV normal grade II DD  Review of Systems: [y] = yes, [ ]  = no   . General: Weight gain [ ] ; Weight loss [ ] ; Anorexia [ ] ; Fatigue [Y ]; Fever [ ] ; Chills [ ] ; Weakness [ Y]  . Cardiac: Chest pain/pressure [ ] ; Resting SOB [ ] ; Exertional SOB [ ] ; Orthopnea [ ] ; Pedal Edema [ ] ; Palpitations [ ] ; Syncope [ ] ; Presyncope [ ] ; Paroxysmal nocturnal dyspnea[ ]   . Pulmonary: Cough [ ] ; Wheezing[ ] ; Hemoptysis[ ] ; Sputum [ ] ; Snoring [ ]   . GI:  Vomiting[ ] ; Dysphagia[ ] ; Melena[ ] ; Hematochezia [ ] ; Heartburn[ ] ; Abdominal pain [ ] ; Constipation [ ] ; Diarrhea [ ] ; BRBPR [ ]   . GU: Hematuria[ ] ; Dysuria [ ] ; Nocturia[ ]   . Vascular: Pain in legs with walking [ ] ; Pain in feet with lying flat [ ] ; Non-healing sores [ ] ; Stroke [ ] ; TIA [ ] ; Slurred speech [ ] ;  . Neuro: Headaches[ ] ; Vertigo[ ] ; Seizures[ ] ; Paresthesias[ ] ;Blurred vision [ ] ; Diplopia [ ] ; Vision changes [ ]   . Ortho/Skin: Arthritis [ ] ; Joint pain [ ] ; Muscle pain [ ] ; Joint swelling [ ] ; Back Pain [Y ]; Rash [ ]   . Psych: Depression[ ] ; Anxiety[ ]   . Heme: Bleeding problems [ ] ; Clotting disorders [ ] ; Anemia [ ]   . Endocrine: Diabetes [Y ]; Thyroid dysfunction[ ]   Home Medications Prior to Admission medications   Medication Sig Start Date End Date Taking? Authorizing Provider  acetaminophen (TYLENOL) 500 MG tablet Take 1,000 mg by mouth every 6 (six) hours as needed for moderate pain or headache.   Yes [provider]  aspirin EC 81 MG tablet Take 81 mg by mouth daily.   Yes [provider]  calcitRIOL (ROCALTROL) 0.25 MCG capsule Take 0.25 mcg by  mouth every Monday, Wednesday, and Friday.  06/03/20  Yes [provider]  cinacalcet (SENSIPAR) 30 MG tablet Take 30 mg by mouth daily with breakfast.  03/23/19  Yes [provider]  citalopram (CELEXA) 10 MG tablet TAKE ONE (1) [place] Patient taking differently: Take 10 mg by mouth daily.  08/24/19  Yes Susy Frizzle, MD  doxazosin (CARDURA) 8 MG tablet Take 1 tablet (8 mg total) by mouth daily. 02/17/20  Yes Susy Frizzle, MD  furosemide (LASIX) 20 MG tablet Take 20 mg by mouth daily as needed for edema.  12/23/19  Yes [provider]  hydrALAZINE (APRESOLINE) 50 MG tablet Take 50 mg by mouth 3 (three) times daily. 2 tablets in the morning and 1 tablet at night   Yes [provider]  metoprolol tartrate (LOPRESSOR) 25 MG tablet Take 1 tablet (25  mg total) by mouth 2 (two) times daily. 06/29/20  Yes Allred, Jeneen Rinks, MD  olmesartan (BENICAR) 5 MG tablet Take 5 mg by mouth daily.   Yes [provider]  pantoprazole (PROTONIX) 40 MG tablet TAKE ONE TABLET BY MOUTH TWICE A DAY 09/07/19  Yes Susy Frizzle, MD  pravastatin (PRAVACHOL) 20 MG tablet TAKE ONE (1) TABLET BY MOUTH EVERY DAY Patient taking differently: Take 20 mg by mouth daily.  12/21/19  Yes Susy Frizzle, MD  sildenafil (VIAGRA) 100 MG tablet Take 0.5-1 tablets (50-100 mg total) by mouth daily as needed for erectile dysfunction. 05/09/20  Yes Susy Frizzle, MD  sitaGLIPtin (JANUVIA) 50 MG tablet Take 25 mg by mouth daily.   Yes [provider]  sodium bicarbonate 650 MG tablet Take 1,950 mg by mouth daily.    Yes [provider]    Past Medical History: Past Medical History:  Diagnosis Date  . BPH (benign prostatic hyperplasia)   . CAD S/P percutaneous coronary angioplasty    a. PTCA of [place] b. PCI with BMS to LAD in 1997 c. RCA PCI [place] d. s/p CABG in 11/2011 with LIMA-LAD, SVG-PDA, SVG-OM2, and SVG-D1  . Chronic back pain   . CKD (chronic kidney disease), stage III ([place])   . Cyst of bursa    R shoulder  . Diabetic retinopathy ([place])   . DM (diabetes mellitus), type 2 with renal complications ([place])   . Essential hypertension   . Frequent PVCs   . GERD (gastroesophageal reflux disease)   . Ischemic cardiomyopathy 11/2011   Intra-OP TEE: EF 40-45%, no regional WMA; improved Anterior WM post CABG.  . Left carotid artery stenosis   . Mixed hyperlipidemia   . 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   LIMA to LAD, SVG to D1, SVG to OM2, SVG to PDA, EVH via right thigh and leg    Past Surgical History: Past Surgical History:  Procedure Laterality Date  . BACK SURGERY  1970  . CARDIAC CATHETERIZATION  2013  . CORONARY ANGIOPLASTY  1994   OM  . CORONARY ANGIOPLASTY  1997   LAD  . CORONARY ANGIOPLASTY WITH STENT PLACEMENT  1999   RCA   . CORONARY ANGIOPLASTY WITH STENT PLACEMENT  2001   RCA  . CORONARY ARTERY BYPASS GRAFT  12/27/2011   Procedure: CORONARY ARTERY BYPASS GRAFTING (CABG);  Surgeon: Rexene Alberts, MD;  Location: [facility];  Service: Open Heart Surgery;  Laterality: N/A;  Times four. On pump. Using endoscopically harvested right greater saphenous vein and left internal mammary  artery.   [name] HERNIA REPAIR    . INCISION AND DRAINAGE OF WOUND  2006   [place]  . INTRAOPERATIVE TRANSESOPHAGEAL ECHOCARDIOGRAM  12/27/2011   Global hypokinesis with EF of 40-45%, improved LAD distribution wall motion.  [name] LEFT HEART CATHETERIZATION WITH CORONARY ANGIOGRAM N/A 12/13/2011   Procedure: LEFT HEART CATHETERIZATION WITH CORONARY ANGIOGRAM;  Surgeon: Leonie Man, MD;  Location: [facility];  Service: Cardiovascular;  Laterality: N/A;  . LUMBAR FUSION    . MASS EXCISION  10/24/11   R arm  . RIGHT HEART CATH N/A 07/12/2020   Procedure: RIGHT HEART CATH;  Surgeon: Belva Crome, MD;  Location: [facility];  Service: Cardiovascular;  Laterality: N/A;  . TRANSESOPHAGEAL ECHOCARDIOGRAM  2013    Family History: Family History  Problem Relation Age of Onset  . Cancer Mother        breast  . Cancer Father        bone  . [place] cancer Neg Hx     Social History: Social History   Socioeconomic History  . Marital status: Married    Spouse name: Not on file  . Number of children: Not on file  . Years of education: Not on file  . Highest education level: Not on file  Occupational History  . Not on file  Tobacco Use  . Smoking status: Former Smoker    Types: Cigarettes    Quit date: 10/01/1986    Years since quitting: 33.8  . Smokeless tobacco: Never Used  . Tobacco comment: quit about 30 yrs ago  Vaping Use  . Vaping Use: Never used  Substance and Sexual Activity  . Alcohol use: No  . Drug use: No  . Sexual activity: Not on file  Other Topics Concern  . Not on file  Social History Narrative   Married father  of one grandfather 22. Works out at [employer] at [employer] roughly 2-3 days a week. He works on a treadmill. He does note having a hard time getting his heart rate up.   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   He quit smoking in 1988, and does not drink alcohol.   Social Determinants of Health   Financial Resource Strain:   . Difficulty of Paying Living Expenses: Not on file  Food Insecurity:   . Worried About Charity fundraiser in the Last Year: Not on file  . Ran Out of Food in the Last Year: Not on file  Transportation Needs:   . Lack of Transportation (Medical): Not on file  . Lack of Transportation (Non-Medical): Not on file  Physical Activity:   . Days of Exercise per Week: Not on file  . Minutes of Exercise per Session: Not on file  Stress:   . Feeling of Stress : Not on file  Social Connections:   . Frequency of Communication with Friends and Family: Not on file  . Frequency of Social Gatherings with Friends and Family: Not on file  . Attends Religious Services: Not on file  . Active Member of Clubs or Organizations: Not on file  . Attends Archivist Meetings: Not on file  . Marital Status: Not on file    Allergies:  No Known Allergies  Objective:    Vital Signs:   Temp:  [97.5 F (36.4 C)-98.2 F (36.8 C)] 98.1 F (36.7 C) (10/13 0810) Pulse Rate:  [11-79] 55 (10/13 0810) Resp:  [13-20] 20 (10/13 0810) BP: (129-179)/(55-83)  143/58 (10/13 0810) SpO2:  [0 %-99 %] 96 % (10/13 0810) Weight:  [64.3 kg] 64.3 kg (10/13 0429) Last BM Date: 07/12/20  Weight change: Filed Weights   07/11/20 0458 07/12/20 0645 07/13/20 0429  Weight: 67.9 kg 64.3 kg 64.3 kg    Intake/Output:   Intake/Output Summary (Last 24 hours) at 07/13/2020 1304 Last data filed at 07/13/2020 1100 Gross per 24 hour  Intake 465 ml  Output 1000 ml  Net -535 ml      Physical Exam    General: No resp difficulty HEENT: normal Neck: supple. JVP . Carotids 2+ bilat; no bruits.  No lymphadenopathy or thyromegaly appreciated. Cor: PMI nondisplaced. Regular rate & rhythm. No rubs, gallops or murmurs. Lungs: clear Abdomen: soft, nontender, nondistended. No hepatosplenomegaly. No bruits or masses. Good bowel sounds. Extremities: no cyanosis, clubbing, rash, edema Neuro: alert & orientedx3, cranial nerves grossly intact. moves all 4 extremities w/o difficulty. Affect pleasant   Telemetry   SR with frequent PVCs  EKG    SR 55 bpm with PVCs.  Labs   Basic Metabolic Panel: Recent Labs  Lab 07/09/20 0440 07/09/20 0440 07/10/20 0700 07/10/20 0700 07/11/20 0818 07/12/20 0829 07/12/20 1543 07/12/20 1544 07/13/20 0118  NA 138   < > 138   < > 138 139 142 142 141  K 5.1   < > 4.1   < > 3.7 3.6 4.3 4.3 4.0  CL 106  --  103  --  101 102  --   --  105  CO2 19*  --  21*  --  23 24  --   --  25  GLUCOSE 148*  --  114*  --  97 116*  --   --  160*  BUN 83*  --  96*  --  93* 98*  --   --  88*  CREATININE 4.37*  --  4.65*  --  4.48* 4.28*  --   --  3.85*  CALCIUM 8.2*   < > 8.7*   < > 8.6* 8.7*  --   --  9.0  MG  --   --  2.1  --  2.1 2.1  --   --   --   PHOS  --   --  4.9*  --  4.5 4.6  --   --   --    < > = values in this interval not displayed.    Liver Function Tests: Recent Labs  Lab 07/09/20 0440 07/10/20 0700 07/11/20 0818 07/12/20 0829 07/13/20 0118  AST 290* 328* 135* 88* 70*  ALT 322* 583* 465* 351* 288*  ALKPHOS 102 103 89 91 94  BILITOT 2.5* 2.3* 2.1* 2.0* 1.2  PROT 6.3* 6.4* 6.2* 6.2* 5.8*  ALBUMIN 3.5 3.5 3.3* 3.3* 3.0*   No results for input(s): LIPASE, AMYLASE in the last 168 hours. No results for input(s): AMMONIA in the last 168 hours.  CBC: Recent Labs  Lab 07/08/20 1329 07/08/20 1329 07/10/20 0700 07/10/20 0700 07/11/20 0818 07/12/20 0829 07/12/20 1543 07/12/20 1544 07/13/20 0118  WBC 8.2  --  8.5  --  7.5 6.6  --   --  5.7  NEUTROABS 7.0  --   --   --   --   --   --   --   --   HGB 10.8*   < > 10.3*   < > 10.3* 10.9*  10.9* 10.9* 10.4*  HCT 32.2*   < > 30.8*   < >  30.9* 32.9* 32.0* 32.0* 31.9*  MCV 96.7  --  94.2  --  94.8 95.4  --   --  96.7  PLT 110*  --  105*  --  107* 125*  --   --  133*   < > = values in this interval not displayed.    Cardiac Enzymes: No results for input(s): CKTOTAL, CKMB, CKMBINDEX, TROPONINI in the last 168 hours.  BNP: BNP (last 3 results) Recent Labs    07/08/20 1329  BNP >4,500.0*    ProBNP (last 3 results) No results for input(s): PROBNP in the last 8760 hours.   CBG: Recent Labs  Lab 07/12/20 1113 07/12/20 1618 07/12/20 2125 07/13/20 0620 07/13/20 1129  GLUCAP 101* 110* 208* 97 218*    Coagulation Studies: No results for input(s): LABPROT, INR in the last 72 hours.   Imaging   CARDIAC CATHETERIZATION  Result Date: 07/12/2020  Mildly elevated mean pulmonary artery pressure at 21 mmHg.  This is consistent with mild pulmonary hypertension.  Pulmonary capillary wedge pressure mean is 11 mmHg.  Cardiac output 4.6 L/min.  Main pulmonary artery O2 saturation 64%.  Arterial pulse oximetry O2 saturation 99%.  Pulmonary vascular resistance 2.16 Wood units.     Medications:     Current Medications: . amiodarone  200 mg Oral BID  . aspirin EC  81 mg Oral Daily  . calcitRIOL  0.25 mcg Oral Q M,W,F  . citalopram  10 mg Oral Daily  . doxazosin  8 mg Oral Daily  . enoxaparin (LOVENOX) injection  30 mg Subcutaneous Q24H  . hydrALAZINE  75 mg Oral TID  . insulin aspart  0-15 Units Subcutaneous TID WC  . insulin aspart  0-5 Units Subcutaneous QHS  . isosorbide mononitrate  30 mg Oral Daily  . pantoprazole  40 mg Oral BID  . sodium bicarbonate  1,950 mg Oral Daily  . sodium chloride flush  3 mL Intravenous Q12H  . sodium chloride flush  3 mL Intravenous Q12H     Infusions: . sodium chloride    . sodium chloride         Assessment/Plan   1. 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   [place] 07/12/20 with CO 4.6  Volume was stable.   - Volume status stable.  - No bb, arni, spiro, dig with elevated creatinine.  - Continue hydralazine 75 mg three times a day + imdur 30 mg daily  2. AKI on CKD Stage IV - In the community followed by Dr Theador Hawthorne. -Nephrology following and started torsemide 60 mg today.  -Creatinine trending up 2.8>3.8> 4.4 -Renal US negative.   3. PVC Saw Dr Rayann Heman back in September and ablation was not indicated.  He was switched from diltiazem to toprol. High PVC burden .  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 Will need EP follow up.   Will set up Dr Bradley Black and Dr Haroldine Laws.   Length of Stay: Gordon, NP  07/13/2020, 1:04 PM  Advanced Heart Failure Team Pager [phone] (M-F; 7a - 4p)  Please contact [facility] for night-coverage after hours (4p -7a ) and weekends on [email or web address]  Agree with above.   74 y/o male with CAD s/p CABG, ischemic CM EF 45%, CKD IV, DM2, PVCs   Transferred from [facility] fro further management of HF and worsening renal function.   EF 45% -> 20%  with biventricular failure  RHC yesterday with well compensated hemodynamics  General:  Weak appearing. No resp difficulty HEENT: normal Neck: supple. no JVD. Carotids 2+ bilat; no bruits. No lymphadenopathy or thryomegaly appreciated. Cor: PMI nondisplaced. Irregular rate & rhythm. No rubs, gallops or murmurs. Lungs: clear Abdomen: soft, nontender, nondistended. No hepatosplenomegaly. No bruits or masses. Good bowel sounds. Extremities: no cyanosis, clubbing, rash, edema Neuro: alert & orientedx3, cranial nerves grossly intact. moves all 4 extremities w/o difficulty. Affect pleasant  Suspect he may have PVC-induced CM. [place] numbers well compensated. Without anginal symptoms no role for coronary angio in setting of CKD IV. Renal managing diuretics.   Would proceed with trial of PVC suppression with amio 200bid and reassess EF in 1-2  months. Ok for d/c today from our standpoint. D/w Dr. Benny Lennert.   Glori Bickers, MD  3:02 PM

## 2020-07-13 NOTE — Progress Notes (Signed)
Admit: 07/08/2020 LOS: 5  7M AoCKD4, BiV HFrEF, ICM  Subjective:  . RHC yesterday at [facility], PCWP 11 ; CO 4.6L/min . SCr down to 3.9, K 4.0 . On RA . 0.6L UOP yesterday  10/12 0701 - 10/13 0700 In: 225 [P.O.:222; I.V.:3] Out: 600 [Urine:600]  Filed Weights   07/11/20 0458 07/12/20 0645 07/13/20 0429  Weight: 67.9 kg 64.3 kg 64.3 kg    Scheduled Meds: . amiodarone  200 mg Oral BID  . aspirin EC  81 mg Oral Daily  . calcitRIOL  0.25 mcg Oral Q M,W,F  . citalopram  10 mg Oral Daily  . doxazosin  8 mg Oral Daily  . enoxaparin (LOVENOX) injection  30 mg Subcutaneous Q24H  . hydrALAZINE  75 mg Oral TID  . insulin aspart  0-15 Units Subcutaneous TID WC  . insulin aspart  0-5 Units Subcutaneous QHS  . isosorbide mononitrate  30 mg Oral Daily  . pantoprazole  40 mg Oral BID  . sodium bicarbonate  1,950 mg Oral Daily  . sodium chloride flush  3 mL Intravenous Q12H  . sodium chloride flush  3 mL Intravenous Q12H   Continuous Infusions: . sodium chloride    . sodium chloride     PRN Meds:.sodium chloride, sodium chloride, acetaminophen, ondansetron (ZOFRAN) IV, oxyCODONE, sodium chloride flush, sodium chloride flush  Current Labs: reviewed   Physical Exam:  Blood pressure (!) 143/58, pulse (!) 55, temperature 98.1 F (36.7 C), temperature source Oral, resp. rate 20, height 5\' 8"  (1.727 m), weight 64.3 kg, SpO2 96 %. NAD Faint bibasilar crackles Regular, nl s1s2 No sig LEE nonfocal EOMI NCAT  A 1. AoCKD4, improving 1. Sees [facility] in [place] for CKD care 2. Start torsemide 60mg  qAM today 2. AoC HFrEF, ICM 1. RHC yesterday 2. Cardiology following 3. HTN 4. DM2 5. 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 Anemia, Hb 10s 7. Inc LFTs  P . Start torsemide . Daily weights, Daily Renal Panel, Strict I/Os, Avoid nephrotoxins (NSAIDs, judicious IV Contrast   Pearson Grippe MD 07/13/2020, 12:29 PM  Recent Labs  Lab 07/10/20 0700 07/10/20 0700 07/11/20 0818 07/11/20 0818 07/12/20 0829  07/12/20 0829 07/12/20 1543 07/12/20 1544 07/13/20 0118  NA 138   < > 138   < > 139   < > 142 142 141  K 4.1   < > 3.7   < > 3.6   < > 4.3 4.3 4.0  CL 103   < > 101  --  102  --   --   --  105  CO2 21*   < > 23  --  24  --   --   --  25  GLUCOSE 114*   < > 97  --  116*  --   --   --  160*  BUN 96*   < > 93*  --  98*  --   --   --  88*  CREATININE 4.65*   < > 4.48*  --  4.28*  --   --   --  3.85*  CALCIUM 8.7*   < > 8.6*  --  8.7*  --   --   --  9.0  PHOS 4.9*  --  4.5  --  4.6  --   --   --   --    < > = values in this interval not displayed.   Recent Labs  Lab 07/08/20 1329 07/10/20 0700 07/11/20 0818 07/11/20 0818 07/12/20 0829 07/12/20  7341 07/12/20 1543 07/12/20 1544 07/13/20 0118  WBC 8.2   < > 7.5  --  6.6  --   --   --  5.7  NEUTROABS 7.0  --   --   --   --   --   --   --   --   HGB 10.8*   < > 10.3*   < > 10.9*   < > 10.9* 10.9* 10.4*  HCT 32.2*   < > 30.9*   < > 32.9*   < > 32.0* 32.0* 31.9*  MCV 96.7   < > 94.8  --  95.4  --   --   --  96.7  PLT 110*   < > 107*  --  125*  --   --   --  133*   < > = values in this interval not displayed.

## 2020-07-13 NOTE — Progress Notes (Signed)
Progress Note  Patient Name: Bradley Black Date of Encounter: 07/13/2020  Primary Cardiologist: Elvin So   Subjective   74 yo M with a history of CAD s/p CABG (PTCA of [place] b. PCI with BMS to LAD in 1997 c. RCA PCI [place] d. s/p CABG in 11/2011 with LIMA-LAD, SVG-PDA, SVG-OM2, and SVG-D1) HFrEF former 45% down to 25% 07/09/20 with restrictive pattern and biventricular failure, frequent PVCs previously on metoprolol , with CKD Stage IV.  Through course had amiodarone started and metoprolol held (has hx of prior bradycardia and PVC burden appeared to have escalated).  RHC performed 07/21/20, PWCP of 11.  Patient notes no chest pain or shortness of breath.  Most of his swelling is in his abdomen.  Had been planned to get an AV fistula as outpatient for HD  Inpatient Medications    Scheduled Meds:  amiodarone  200 mg Oral BID   aspirin EC  81 mg Oral Daily   calcitRIOL  0.25 mcg Oral Q M,W,F   citalopram  10 mg Oral Daily   doxazosin  8 mg Oral Daily   enoxaparin (LOVENOX) injection  30 mg Subcutaneous Q24H   hydrALAZINE  50 mg Oral TID   insulin aspart  0-15 Units Subcutaneous TID WC   insulin aspart  0-5 Units Subcutaneous QHS   isosorbide mononitrate  30 mg Oral Daily   pantoprazole  40 mg Oral BID   sodium bicarbonate  1,950 mg Oral Daily   sodium chloride flush  3 mL Intravenous Q12H   sodium chloride flush  3 mL Intravenous Q12H   Continuous Infusions:  sodium chloride     sodium chloride     PRN Meds: sodium chloride, sodium chloride, acetaminophen, ondansetron (ZOFRAN) IV, oxyCODONE, sodium chloride flush, sodium chloride flush   Vital Signs    Vitals:   07/12/20 2042 07/13/20 0024 07/13/20 0429 07/13/20 0810  BP: (!) 130/55 130/63 (!) 152/78 (!) 143/58  Pulse:  (!) 47 (!) 47 (!) 55  Resp:  20 18 20   Temp:  98.2 F (36.8 C) 98.2 F (36.8 C) 98.1 F (36.7 C)  TempSrc:  Oral Oral Oral  SpO2:  98% 97% 96%  Weight:   64.3 kg     Height:        Intake/Output Summary (Last 24 hours) at 07/13/2020 0914 Last data filed at 07/13/2020 0814 Gross per 24 hour  Intake 465 ml  Output 600 ml  Net -135 ml   Filed Weights   07/11/20 0458 07/12/20 0645 07/13/20 0429  Weight: 67.9 kg 64.3 kg 64.3 kg    Telemetry    Multiple runs of NSVT - Personally Reviewed  ECG    Sinus bradycardia, rate 54, IVCD QRS duration 124, frequent polymorphic PVCs - Personally Reviewed (07/11/20)  Physical Exam   GEN: Elderly Male No acute distress.   Neck: No JVD Cardiac: RRR, no murmurs, rubs, or gallops.  Respiratory: Clear to auscultation bilaterally. GI: Soft, nontender, non-distended  MS: No edema; No deformity. Neuro:  Nonfocal  Psych: Normal affect   Labs    Chemistry Recent Labs  Lab 07/11/20 0818 07/11/20 0818 07/12/20 0829 07/12/20 0829 07/12/20 1543 07/12/20 1544 07/13/20 0118  NA 138   < > 139   < > 142 142 141  K 3.7   < > 3.6   < > 4.3 4.3 4.0  CL 101  --  102  --   --   --  105  CO2 23  --  24  --   --   --  25  GLUCOSE 97  --  116*  --   --   --  160*  BUN 93*  --  98*  --   --   --  88*  CREATININE 4.48*  --  4.28*  --   --   --  3.85*  CALCIUM 8.6*  --  8.7*  --   --   --  9.0  PROT 6.2*  --  6.2*  --   --   --  5.8*  ALBUMIN 3.3*  --  3.3*  --   --   --  3.0*  AST 135*  --  88*  --   --   --  70*  ALT 465*  --  351*  --   --   --  288*  ALKPHOS 89  --  91  --   --   --  94  BILITOT 2.1*  --  2.0*  --   --   --  1.2  GFRNONAA 12*  --  13*  --   --   --  14*  ANIONGAP 14  --  13  --   --   --  11   < > = values in this interval not displayed.     Hematology Recent Labs  Lab 07/11/20 0818 07/11/20 0818 07/12/20 0829 07/12/20 0829 07/12/20 1543 07/12/20 1544 07/13/20 0118  WBC 7.5  --  6.6  --   --   --  5.7  RBC 3.26*  --  3.45*  --   --   --  3.30*  HGB 10.3*   < > 10.9*   < > 10.9* 10.9* 10.4*  HCT 30.9*   < > 32.9*   < > 32.0* 32.0* 31.9*  MCV 94.8  --  95.4  --   --   --  96.7   MCH 31.6  --  31.6  --   --   --  31.5  MCHC 33.3  --  33.1  --   --   --  32.6  RDW 14.3  --  14.4  --   --   --  14.4  PLT 107*  --  125*  --   --   --  133*   < > = values in this interval not displayed.    BNP Recent Labs  Lab 07/08/20 1329  BNP >4,500.0*      Radiology    CARDIAC CATHETERIZATION  Result Date: 07/12/2020  Mildly elevated mean pulmonary artery pressure at 21 mmHg.  This is consistent with mild pulmonary hypertension.  Pulmonary capillary wedge pressure mean is 11 mmHg.  Cardiac output 4.6 L/min.  Main pulmonary artery O2 saturation 64%.  Arterial pulse oximetry O2 saturation 99%.  Pulmonary vascular resistance 2.16 Wood units.  US RENAL  Result Date: 07/11/2020 CLINICAL DATA:  Acute kidney injury. EXAM: RENAL / URINARY TRACT ULTRASOUND COMPLETE COMPARISON:  03/14/2017. FINDINGS: Right Kidney: Renal measurements: 8.5 x 4.6 x 3.5 cm = volume: 72 mL. Diffusely increased echogenicity without hydronephrosis. Left Kidney: Renal measurements: 9.8 x 5.4 x 4.5 cm = volume: 126 mL. Echogenic parenchyma with 2.9 cm cystic lesion interpolar region not substantially changed from 2.6 cm on ultrasound exam of 03/14/2017. Bladder: Appears normal for degree of bladder distention. Other: None. IMPRESSION: 1. No acute findings. No hydronephrosis. 2. 2.9 cm cyst interpolar region left kidney  not substantially changed since 2018. 3. Increased echogenicity of both kidneys compatible with medical renal disease. Electronically Signed   By: Misty Stanley M.D.   On: 07/11/2020 10:53    Cardiac Studies   LHC and Echo as above EF near 20% IMPRESSIONS   1. Compared to previous echo, LVEF and RVEF are worse . Left ventricular  ejection fraction, by estimation, is 25%%. The left ventricle has severely  decreased function. The left ventricle demonstrates global hypokinesis.  The left ventricular internal  cavity size was moderately dilated. Left ventricular diastolic parameters  are  consistent with Grade III diastolic dysfunction (restrictive).  Elevated left atrial pressure.   2. Right ventricular systolic function is severely reduced. The right  ventricular size is mildly enlarged. There is moderately elevated  pulmonary artery systolic pressure.   3. Left atrial size was severely dilated.   4. Right atrial size was moderately dilated.   5. The mitral valve is normal in structure. Mild to moderate mitral valve  regurgitation.   6. The aortic valve is tricuspid. Aortic valve regurgitation is trivial.   7. The inferior vena cava is dilated in size with <50% respiratory  variability, suggesting right atrial pressure of 15 mmHg.   Patient Profile     74 y.o. male with decompensated HF complicated by renal failure  Assessment & Plan    Coronary Artery Disease; Obstructive Transaminitis on admission - asymptomatic with worsening EF - anatomy: PCI with BMS to LAD in 1997 c. RCA PCI [place] d. s/p CABG in 11/2011 with LIMA-LAD, SVG-PDA, SVG-OM2, and SVG-D1 - continue ASA 81 mg;  - pravastatin held for transaminitison presentation; would restart peridischarge - bradycardia, would not tolerate BB - ACEi/ARNI/ARB/MRA help in the setting of AKI on CKD - Presently, outpatient candidate for cardiac rehab  Ultimately decision for aggressive therapy (LHC) depends largely on kidney function  Acute Decompensated Heart Failure  PVC burden with NSVT AKI - NYHA class 3, Stage 3, euvolemic, etiology from CAD vs PVCs - CO low normal at 4.6, RA 5, PWCP 11 - will eventually need a strategy for volume removal; PO torsemide/ bumex vs more aggressive measure - Strict I/Os, daily weights, and fluid restriction of < 2 L  - Replace electrolytes PRN and keep K>4 and Mg>2. - Daily BMP, Mg. - BB as above - Despite amiodarone start improvement in LFTs, continue 200 mg BID; ultimately post LFT improvement, would prefer 400 mg daily dosing after load - Consider outpatient SGLT2i  (present AKI) - will increase hydralazine and afterload reduction to 75 mg TID - overall, kidney function will play a prominent role in our therapeutic options   For questions or updates, please contact [facility] Please consult [email or web address] for contact info under Cardiology/STEMI.      Signed, Werner Lean, MD  07/13/2020, 9:14 AM

## 2020-07-13 NOTE — TOC Transition Note (Signed)
Transition of Care [facility]) - CM/SW Discharge Note   Patient Details  Name: Bradley Black MRN: [id number] Date of Birth: [birth year]  Transition of Care [facility]) CM/SW Contact:  Zenon Mayo, RN Phone Number: 07/13/2020, 5:08 PM   Clinical Narrative:    NCM spoke with patient, he states he does not need [place] services. He has no other needs.  Final next level of care: Home/Self Care Barriers to Discharge: No Barriers Identified   Patient Goals and CMS Choice Patient states their goals for this hospitalization and ongoing recovery are:: Return Home with [facility]   Choice offered to / list presented to : Patient, Spouse  Discharge Placement                       Discharge Plan and Services                  DME Agency: NA       HH Arranged: NA          Social Determinants of Health (SDOH) Interventions     Readmission Risk Interventions No flowsheet data found.

## 2020-07-14 ENCOUNTER — Other Ambulatory Visit: Payer: Self-pay

## 2020-07-14 ENCOUNTER — Other Ambulatory Visit: Payer: Self-pay | Admitting: *Deleted

## 2020-07-14 NOTE — Patient Outreach (Signed)
[facility] [facility]) Care Management  07/14/2020  Bradley Black 01-15-1946 [id number]   [place] Discipline Closure  Referral Date: 11/10/2019 Referral Source: Transfer from [address] Reason for Referral: Continued Disease Management Education Insurance: [employer]   Outreach Attempt:  Patient discharged from hospital on 07/13/2020.  Per [facility] Liaison patient will be followed by [place] Coordinator for Transition of Care post hospital.  Plan:  RN Health Coach will close Disease Management Case at this time.  RN Health Coach will send primary care provider Discipline Closure letter.  Bradley Black [phone] [email or web address]

## 2020-07-15 ENCOUNTER — Other Ambulatory Visit: Payer: Self-pay | Admitting: *Deleted

## 2020-07-15 ENCOUNTER — Encounter: Payer: Self-pay | Admitting: *Deleted

## 2020-07-15 DIAGNOSIS — E211 Secondary hyperparathyroidism, not elsewhere classified: Secondary | ICD-10-CM | POA: Diagnosis not present

## 2020-07-15 DIAGNOSIS — E1129 Type 2 diabetes mellitus with other diabetic kidney complication: Secondary | ICD-10-CM | POA: Diagnosis not present

## 2020-07-15 DIAGNOSIS — N189 Chronic kidney disease, unspecified: Secondary | ICD-10-CM | POA: Diagnosis not present

## 2020-07-15 DIAGNOSIS — R809 Proteinuria, unspecified: Secondary | ICD-10-CM | POA: Diagnosis not present

## 2020-07-15 DIAGNOSIS — E1122 Type 2 diabetes mellitus with diabetic chronic kidney disease: Secondary | ICD-10-CM | POA: Diagnosis not present

## 2020-07-15 NOTE — Patient Outreach (Signed)
[facility] [facility]) Care Management  07/15/2020  Bradley Black 06-Nov-1945 [id number]   [facility] post hospital Transition of Care Referral   Referral Date: 07/14/20 Referral Source: [place] hospital liaison Please refer to telephonic RN for post hospital follow up. Patient had been followed by [place] prior to hospital admission.  Admission 07/08/20 -07/13/20 acute on chronic combined congestive Heart Failure (CHF) in exacerbation  Insurance:  [place] [facility]) medicare   Outreach attempt # 1 Patient is able to verify Patient is able to verify HIPAA ([place]) identifiers, date of birth (DOB) and address Reviewed and addressed Transitional of care referral with patient  Consent: [facility] RN CM reviewed [facility] services with patient. Patient gave verbal consent for services. Advised patient that other post discharge calls may occur to assess how the patient is doing following the recent hospitalization. Patient voiced understanding and was appreciative of f/u call.  Transition of care assessment Bradley Black reports being weak with increase urination as he is now on Torsemide 60 mg vs Lasix 20 mg His hemoglobin on 07/13/20 was 10.4 (13-17)  It was 10.9 on 07/12/20 [facility] RN CM educated him on the difference of torsemide and lasix and encouraged hydration, weight monitoring and BP monitoring He reports he will do so. He was encouraged to outreach to [place] CM prn, 24 hr nurse line prn and to discuss also with Dr Rayann Heman on Monday 07/18/20  He reports he called Dr Dennard Schaumann office and left a message and is pending a follow up call from scheduling an appointment He has purchased all his medications  Wt on 06/29/20= 158 lbs and on 07/15/20 wt 141.75 = wt loss of 17 lbs BMI 21.56 after hospitalization   Diabetes 07/08/20 HgA1c=  5.7 was 6 a yr ago  Past Medical History:  Diagnosis Date  . BPH (benign prostatic hyperplasia)   .  CAD S/P percutaneous coronary angioplasty    a. PTCA of [place] b. PCI with BMS to LAD in 1997 c. RCA PCI [place] d. s/p CABG in 11/2011 with LIMA-LAD, SVG-PDA, SVG-OM2, and SVG-D1  . Chronic back pain   . CKD (chronic kidney disease), stage III ([place])   . Cyst of bursa    R shoulder  . Diabetic retinopathy ([place])   . DM (diabetes mellitus), type 2 with renal complications ([place])   . Essential hypertension   . Frequent PVCs   . GERD (gastroesophageal reflux disease)   . Ischemic cardiomyopathy 11/2011   Intra-OP TEE: EF 40-45%, no regional WMA; improved Anterior WM post CABG.  . Left carotid artery stenosis   . Mixed hyperlipidemia   . 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  [facility]) Monitor and Manage My Blood Sugar (pt-stated)      Follow Up Date 07/22/20 - check blood sugar at prescribed times - check blood sugar if I feel it is too high or too low    Why is  this important?   Checking your blood sugar at home helps to keep it from getting very high or very low.  Writing the results in a diary or log helps the doctor know how to care for you.  Your blood sugar log should have the time, date and the results.  Also, write down the amount of insulin or other medicine that you take.  Other information, like what you ate, exercise done and how you were feeling, will also be helpful.     Notes:     .  [facility]) Track and Manage Fluids and Swelling (pt-stated)      Follow Up Date 07/22/20  - call office if I gain more than 2 pounds in one day or 5 pounds in one week - watch for swelling in feet, ankles and legs every day -  weigh myself daily    Why is this important?   It is important to check your weight daily and watch how much salt and liquids you have.  It will help you to manage your heart failure.    Notes:      .  [facility]) Track and Manage My Blood Pressure (pt-stated)      Follow Up Date 07/22/20   - check blood pressure weekly - choose a place to take my blood pressure (home, clinic or office, retail store)    Why is this important?   You won't feel high blood pressure, but it can still hurt your blood vessels.  High blood pressure can cause heart or kidney problems. It can also cause a stroke.  Making lifestyle changes like losing a little weight or eating less salt will help.  Checking your blood pressure at home and at different times of the day can help to control blood pressure.  If the doctor prescribes medicine remember to take it the way the doctor ordered.  Call the office if you cannot afford the medicine or if there are questions about it.     Notes:        [place]wall Doss L. Lavina Hamman, RN, BSN, [place] Management Care Coordinator Direct Number [phone] Mobile number [phone]  Main [facility] number [phone] Fax number [phone]

## 2020-07-18 ENCOUNTER — Emergency Department (HOSPITAL_COMMUNITY): Payer: Medicare Other

## 2020-07-18 ENCOUNTER — Encounter: Payer: Self-pay | Admitting: Internal Medicine

## 2020-07-18 ENCOUNTER — Ambulatory Visit: Payer: Medicare Other | Admitting: Internal Medicine

## 2020-07-18 ENCOUNTER — Other Ambulatory Visit: Payer: Self-pay

## 2020-07-18 ENCOUNTER — Observation Stay (HOSPITAL_BASED_OUTPATIENT_CLINIC_OR_DEPARTMENT_OTHER)
Admission: EM | Admit: 2020-07-18 | Discharge: 2020-07-19 | Disposition: A | Discharge status: Home / Self Care | Payer: Medicare Other | Source: Home / Self Care | Attending: Emergency Medicine | Admitting: Emergency Medicine

## 2020-07-18 ENCOUNTER — Encounter (HOSPITAL_COMMUNITY): Payer: Self-pay | Admitting: *Deleted

## 2020-07-18 VITALS — BP 132/64 | HR 55 | Ht 68.0 in | Wt 143.6 lb

## 2020-07-18 DIAGNOSIS — Z79899 Other long term (current) drug therapy: Secondary | ICD-10-CM | POA: Insufficient documentation

## 2020-07-18 DIAGNOSIS — E1122 Type 2 diabetes mellitus with diabetic chronic kidney disease: Secondary | ICD-10-CM | POA: Insufficient documentation

## 2020-07-18 DIAGNOSIS — N184 Chronic kidney disease, stage 4 (severe): Secondary | ICD-10-CM | POA: Diagnosis present

## 2020-07-18 DIAGNOSIS — I255 Ischemic cardiomyopathy: Secondary | ICD-10-CM | POA: Diagnosis not present

## 2020-07-18 DIAGNOSIS — I130 Hypertensive heart and chronic kidney disease with heart failure and stage 1 through stage 4 chronic kidney disease, or unspecified chronic kidney disease: Secondary | ICD-10-CM | POA: Insufficient documentation

## 2020-07-18 DIAGNOSIS — R251 Tremor, unspecified: Secondary | ICD-10-CM | POA: Diagnosis not present

## 2020-07-18 DIAGNOSIS — Z7984 Long term (current) use of oral hypoglycemic drugs: Secondary | ICD-10-CM | POA: Insufficient documentation

## 2020-07-18 DIAGNOSIS — R29818 Other symptoms and signs involving the nervous system: Secondary | ICD-10-CM | POA: Diagnosis not present

## 2020-07-18 DIAGNOSIS — I509 Heart failure, unspecified: Secondary | ICD-10-CM | POA: Insufficient documentation

## 2020-07-18 DIAGNOSIS — I132 Hypertensive heart and chronic kidney disease with heart failure and with stage 5 chronic kidney disease, or end stage renal disease: Secondary | ICD-10-CM | POA: Diagnosis not present

## 2020-07-18 DIAGNOSIS — N178 Other acute kidney failure: Secondary | ICD-10-CM | POA: Diagnosis not present

## 2020-07-18 DIAGNOSIS — Z743 Need for continuous supervision: Secondary | ICD-10-CM | POA: Diagnosis not present

## 2020-07-18 DIAGNOSIS — G319 Degenerative disease of nervous system, unspecified: Secondary | ICD-10-CM | POA: Diagnosis not present

## 2020-07-18 DIAGNOSIS — G253 Myoclonus: Secondary | ICD-10-CM | POA: Insufficient documentation

## 2020-07-18 DIAGNOSIS — I5043 Acute on chronic combined systolic (congestive) and diastolic (congestive) heart failure: Secondary | ICD-10-CM | POA: Diagnosis not present

## 2020-07-18 DIAGNOSIS — N179 Acute kidney failure, unspecified: Secondary | ICD-10-CM

## 2020-07-18 DIAGNOSIS — I493 Ventricular premature depolarization: Secondary | ICD-10-CM | POA: Diagnosis not present

## 2020-07-18 DIAGNOSIS — I10 Essential (primary) hypertension: Secondary | ICD-10-CM | POA: Diagnosis present

## 2020-07-18 DIAGNOSIS — R5381 Other malaise: Secondary | ICD-10-CM | POA: Diagnosis not present

## 2020-07-18 DIAGNOSIS — I2510 Atherosclerotic heart disease of native coronary artery without angina pectoris: Secondary | ICD-10-CM | POA: Diagnosis present

## 2020-07-18 DIAGNOSIS — R69 Illness, unspecified: Secondary | ICD-10-CM | POA: Diagnosis not present

## 2020-07-18 DIAGNOSIS — Z7982 Long term (current) use of aspirin: Secondary | ICD-10-CM | POA: Insufficient documentation

## 2020-07-18 DIAGNOSIS — Z87891 Personal history of nicotine dependence: Secondary | ICD-10-CM | POA: Insufficient documentation

## 2020-07-18 LAB — COMPREHENSIVE METABOLIC PANEL
ALT: 91 U/L — ABNORMAL HIGH (ref 0–44)
AST: 28 U/L (ref 15–41)
Albumin: 3.5 g/dL (ref 3.5–5.0)
Alkaline Phosphatase: 92 U/L (ref 38–126)
Anion gap: 12 (ref 5–15)
BUN: 96 mg/dL — ABNORMAL HIGH (ref 8–23)
CO2: 27 mmol/L (ref 22–32)
Calcium: 8.5 mg/dL — ABNORMAL LOW (ref 8.9–10.3)
Chloride: 97 mmol/L — ABNORMAL LOW (ref 98–111)
Creatinine, Ser: 4.17 mg/dL — ABNORMAL HIGH (ref 0.61–1.24)
GFR, Estimated: 13 mL/min — ABNORMAL LOW (ref >60)
Glucose, Bld: 122 mg/dL — ABNORMAL HIGH (ref 70–99)
Potassium: 5.1 mmol/L (ref 3.5–5.1)
Sodium: 136 mmol/L (ref 135–145)
Total Bilirubin: 1.5 mg/dL — ABNORMAL HIGH (ref 0.3–1.2)
Total Protein: 6.6 g/dL (ref 6.5–8.1)

## 2020-07-18 LAB — I-STAT CHEM 8, ED
BUN: 96 mg/dL — ABNORMAL HIGH (ref 8–23)
Calcium, Ion: 1.06 mmol/L — ABNORMAL LOW (ref 1.15–1.40)
Chloride: 97 mmol/L — ABNORMAL LOW (ref 98–111)
Creatinine, Ser: 4.5 mg/dL — ABNORMAL HIGH (ref 0.61–1.24)
Glucose, Bld: 119 mg/dL — ABNORMAL HIGH (ref 70–99)
HCT: 36 % — ABNORMAL LOW (ref 39.0–52.0)
Hemoglobin: 12.2 g/dL — ABNORMAL LOW (ref 13.0–17.0)
Potassium: 5.2 mmol/L — ABNORMAL HIGH (ref 3.5–5.1)
Sodium: 137 mmol/L (ref 135–145)
TCO2: 31 mmol/L (ref 22–32)

## 2020-07-18 LAB — CBC
HCT: 34.4 % — ABNORMAL LOW (ref 39.0–52.0)
Hemoglobin: 11.3 g/dL — ABNORMAL LOW (ref 13.0–17.0)
MCH: 31.4 pg (ref 26.0–34.0)
MCHC: 32.8 g/dL (ref 30.0–36.0)
MCV: 95.6 fL (ref 80.0–100.0)
Platelets: 178 10*3/uL (ref 150–400)
RBC: 3.6 MIL/uL — ABNORMAL LOW (ref 4.22–5.81)
RDW: 14.4 % (ref 11.5–15.5)
WBC: 8.2 10*3/uL (ref 4.0–10.5)
nRBC: 0 % (ref 0.0–0.2)

## 2020-07-18 LAB — PHOSPHORUS: Phosphorus: 5 mg/dL — ABNORMAL HIGH (ref 2.5–4.6)

## 2020-07-18 LAB — GLUCOSE, CAPILLARY: Glucose-Capillary: 82 mg/dL (ref 70–99)

## 2020-07-18 LAB — MAGNESIUM: Magnesium: 2.2 mg/dL (ref 1.7–2.4)

## 2020-07-18 MED ORDER — METHOCARBAMOL 500 MG PO TABS
750.0000 mg | ORAL_TABLET | Freq: Two times a day (BID) | ORAL | Status: AC
  Administered 2020-07-18 – 2020-07-19 (×2): 750 mg via ORAL
  Filled 2020-07-18 (×2): qty 2

## 2020-07-18 MED ORDER — ASPIRIN EC 81 MG PO TBEC
81.0000 mg | DELAYED_RELEASE_TABLET | Freq: Every day | ORAL | Status: DC
  Administered 2020-07-19: 81 mg via ORAL
  Filled 2020-07-18: qty 1

## 2020-07-18 MED ORDER — CALCIUM GLUCONATE-NACL 1-0.675 GM/50ML-% IV SOLN
1.0000 g | Freq: Once | INTRAVENOUS | Status: AC
  Administered 2020-07-18: 1000 mg via INTRAVENOUS
  Filled 2020-07-18: qty 50

## 2020-07-18 MED ORDER — ONDANSETRON HCL 4 MG PO TABS: 4.0000 mg | ORAL_TABLET | Freq: Four times a day (QID) | ORAL | Status: DC | PRN

## 2020-07-18 MED ORDER — INSULIN ASPART 100 UNIT/ML ~~LOC~~ SOLN
0.0000 [IU] | Freq: Three times a day (TID) | SUBCUTANEOUS | Status: DC
  Administered 2020-07-19: 2 [IU] via SUBCUTANEOUS

## 2020-07-18 MED ORDER — CALCITRIOL 0.25 MCG PO CAPS: 0.2500 ug | ORAL_CAPSULE | ORAL | Status: DC

## 2020-07-18 MED ORDER — AMIODARONE HCL 200 MG PO TABS
200.0000 mg | ORAL_TABLET | Freq: Every day | ORAL | Status: DC
  Administered 2020-07-19: 200 mg via ORAL
  Filled 2020-07-18: qty 1

## 2020-07-18 MED ORDER — INSULIN ASPART 100 UNIT/ML ~~LOC~~ SOLN: 0.0000 [IU] | Freq: Every day | SUBCUTANEOUS | Status: DC

## 2020-07-18 MED ORDER — SODIUM BICARBONATE 650 MG PO TABS
1950.0000 mg | ORAL_TABLET | Freq: Every day | ORAL | Status: DC
  Administered 2020-07-19: 1950 mg via ORAL
  Filled 2020-07-18: qty 3

## 2020-07-18 MED ORDER — PANTOPRAZOLE SODIUM 40 MG PO TBEC
40.0000 mg | DELAYED_RELEASE_TABLET | Freq: Two times a day (BID) | ORAL | Status: DC
  Administered 2020-07-18 – 2020-07-19 (×2): 40 mg via ORAL
  Filled 2020-07-18 (×2): qty 1

## 2020-07-18 MED ORDER — CINACALCET HCL 30 MG PO TABS
30.0000 mg | ORAL_TABLET | Freq: Every day | ORAL | Status: DC
  Administered 2020-07-19: 30 mg via ORAL
  Filled 2020-07-18: qty 1

## 2020-07-18 MED ORDER — LORAZEPAM 2 MG/ML IJ SOLN
1.0000 mg | Freq: Once | INTRAMUSCULAR | Status: AC
  Administered 2020-07-18: 1 mg via INTRAVENOUS
  Filled 2020-07-18: qty 1

## 2020-07-18 MED ORDER — ISOSORBIDE MONONITRATE ER 60 MG PO TB24
30.0000 mg | ORAL_TABLET | Freq: Every day | ORAL | Status: DC
  Administered 2020-07-19: 30 mg via ORAL
  Filled 2020-07-18: qty 1

## 2020-07-18 MED ORDER — HEPARIN SODIUM (PORCINE) 5000 UNIT/ML IJ SOLN
5000.0000 [IU] | Freq: Three times a day (TID) | INTRAMUSCULAR | Status: DC
  Administered 2020-07-18 – 2020-07-19 (×3): 5000 [IU] via SUBCUTANEOUS
  Filled 2020-07-18 (×3): qty 1

## 2020-07-18 MED ORDER — DOXAZOSIN MESYLATE 2 MG PO TABS
8.0000 mg | ORAL_TABLET | Freq: Every day | ORAL | Status: DC
  Administered 2020-07-19: 8 mg via ORAL
  Filled 2020-07-18: qty 4

## 2020-07-18 MED ORDER — LORAZEPAM 2 MG/ML IJ SOLN
0.5000 mg | Freq: Once | INTRAMUSCULAR | Status: AC
  Administered 2020-07-18: 0.5 mg via INTRAVENOUS
  Filled 2020-07-18: qty 1

## 2020-07-18 MED ORDER — ONDANSETRON HCL 4 MG/2ML IJ SOLN: 4.0000 mg | Freq: Four times a day (QID) | INTRAMUSCULAR | Status: DC | PRN

## 2020-07-18 MED ORDER — TORSEMIDE 20 MG PO TABS
60.0000 mg | ORAL_TABLET | Freq: Every day | ORAL | Status: DC
  Administered 2020-07-19: 60 mg via ORAL
  Filled 2020-07-18: qty 3

## 2020-07-18 MED ORDER — AMIODARONE HCL 200 MG PO TABS: 200.0000 mg | ORAL_TABLET | Freq: Every day | ORAL | 3 refills | Status: DC

## 2020-07-18 MED ORDER — HYDRALAZINE HCL 25 MG PO TABS
75.0000 mg | ORAL_TABLET | Freq: Three times a day (TID) | ORAL | Status: DC
  Administered 2020-07-18 – 2020-07-19 (×3): 75 mg via ORAL
  Filled 2020-07-18 (×3): qty 3

## 2020-07-18 NOTE — ED Provider Notes (Signed)
[facility] EMERGENCY DEPARTMENT Provider Note   CSN: [id number] Arrival date & time: 07/18/20  1418     History Chief Complaint  Patient presents with  . Tremors    Bradley Black is a 74 y.o. male with an extensive past medical history including stage IV CKD, coronary artery disease status post CABG x4, history of MI, history of diabetes, CAD, CHF.  Patient was recently admitted and discharged for CHF exacerbation had increase in his diuretic medications.  Last night the patient began having tremors all over his body.  He was seen by his cardiologist today who sent him to the emergency department with concern for worsening kidney function.  Denies pain, fever, chills, cough, nausea, vomiting, diarrhea.  The patient states he has never had anything like this before.  He is not on any SSRIs.  HPI     Past Medical History:  Diagnosis Date  . BPH (benign prostatic hyperplasia)   . CAD S/P percutaneous coronary angioplasty    a. PTCA of [place] b. PCI with BMS to LAD in 1997 c. RCA PCI [place] d. s/p CABG in 11/2011 with LIMA-LAD, SVG-PDA, SVG-OM2, and SVG-D1  . Chronic back pain   . CKD (chronic kidney disease), stage III ([place])   . Cyst of bursa    R shoulder  . Diabetic retinopathy ([place])   . DM (diabetes mellitus), type 2 with renal complications ([place])   . Essential hypertension   . Frequent PVCs   . GERD (gastroesophageal reflux disease)   . Ischemic cardiomyopathy 11/2011   Intra-OP TEE: EF 40-45%, no regional WMA; improved Anterior WM post CABG.  . Left carotid artery stenosis   . Mixed hyperlipidemia   . 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 Biventricular heart failure ([place])   . Frequent PVCs   . Acute renal failure superimposed on stage 4 chronic kidney disease ([place])   . SOB (shortness of breath) 07/08/2020  . Elevated LFTs  07/08/2020  . Left carotid artery stenosis   . IDA (iron deficiency anemia) 04/13/2020  . GERD (gastroesophageal reflux disease)   . Essential hypertension 07/24/2019  . Anemia in chronic kidney disease 07/24/2019  . Chronic kidney disease, stage IV (severe) ([place]) 07/24/2019  . Hyperkalemia 07/24/2019  . 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 Type 2 diabetes mellitus with diabetic chronic kidney disease ([place]) 07/19/2016  . Cervicalgia 03/08/2014  . Whiplash injury to neck 03/08/2014  . Aortic heart murmur 10/18/2013  . Acute myocardial infarction, History of:   [name] Hyperlipidemia LDL goal <70   . Anemia 04/23/2013  . 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 Ischemic cardiomyopathy 11/30/2011  . Actinic keratosis, left scalp 10/04/2011  . Seborrheic keratosis, right lateral leg 10/04/2011    Past Surgical History:  Procedure Laterality Date  . BACK SURGERY  1970  . CARDIAC CATHETERIZATION  2013  . CORONARY ANGIOPLASTY  1994   OM  . CORONARY ANGIOPLASTY  1997   LAD  . CORONARY ANGIOPLASTY WITH STENT PLACEMENT  1999   RCA  . CORONARY ANGIOPLASTY WITH STENT PLACEMENT  2001   RCA  . CORONARY ARTERY BYPASS GRAFT  12/27/2011   Procedure: CORONARY ARTERY BYPASS  GRAFTING (CABG);  Surgeon: Rexene Alberts, MD;  Location: [place];  Service: Open Heart Surgery;  Laterality: N/A;  Times four. On pump. Using endoscopically harvested right greater saphenous vein and left internal mammary artery.   [name] HERNIA REPAIR    . INCISION AND DRAINAGE OF WOUND  2006   axilla  . INTRAOPERATIVE TRANSESOPHAGEAL ECHOCARDIOGRAM  12/27/2011   Global hypokinesis with EF of 40-45%, improved LAD distribution wall motion.  [name] LEFT HEART CATHETERIZATION WITH CORONARY ANGIOGRAM N/A 12/13/2011   Procedure: LEFT HEART CATHETERIZATION WITH CORONARY ANGIOGRAM;  Surgeon: Leonie Man, MD;  Location: [place] CATH LAB;  Service: Cardiovascular;  Laterality: N/A;    . LUMBAR FUSION    . MASS EXCISION  10/24/11   R arm  . RIGHT HEART CATH N/A 07/12/2020   Procedure: RIGHT HEART CATH;  Surgeon: Belva Crome, MD;  Location: [place] CV LAB;  Service: Cardiovascular;  Laterality: N/A;  . TRANSESOPHAGEAL ECHOCARDIOGRAM  2013       Family History  Problem Relation Age of Onset  . Cancer Mother        breast  . Cancer Father        bone  . Jonathan cancer Neg Hx     Social History   Tobacco Use  . Smoking status: Former Smoker    Types: Cigarettes    Quit date: 10/01/1986    Years since quitting: 33.8  . Smokeless tobacco: Never Used  . Tobacco comment: quit about 30 yrs ago  Vaping Use  . Vaping Use: Never used  Substance Use Topics  . Alcohol use: No  . Drug use: No    Home Medications Prior to Admission medications   Medication Sig Start Date End Date Taking? Authorizing Provider  acetaminophen (TYLENOL) 500 MG tablet Take 1,000 mg by mouth every 6 (six) hours as needed for moderate pain or headache.    [provider]  amiodarone (PACERONE) 200 MG tablet Take 1 tablet (200 mg total) by mouth daily. 07/18/20   Allred, Jeneen Rinks, MD  aspirin EC 81 MG tablet Take 81 mg by mouth daily.    [provider]  calcitRIOL (ROCALTROL) 0.25 MCG capsule Take 0.25 mcg by mouth every Monday, Wednesday, and Friday.  06/03/20   [provider]  cinacalcet (SENSIPAR) 30 MG tablet Take 30 mg by mouth daily with breakfast.  03/23/19   [provider]  citalopram (CELEXA) 10 MG tablet TAKE ONE (1) [place] Patient taking differently: Take 10 mg by mouth daily.  08/24/19   Susy Frizzle, MD  doxazosin (CARDURA) 8 MG tablet Take 1 tablet (8 mg total) by mouth daily. 02/17/20   Susy Frizzle, MD  hydrALAZINE (APRESOLINE) 50 MG tablet Take 1.5 tablets (75 mg total) by mouth 3 (three) times daily. 07/13/20   Swayze, Ava, DO  isosorbide mononitrate (IMDUR) 30 MG 24 hr tablet Take 1 tablet (30 mg total) by  mouth daily. 07/14/20   Swayze, Ava, DO  pantoprazole (PROTONIX) 40 MG tablet TAKE ONE TABLET BY MOUTH TWICE A DAY 09/07/19   Susy Frizzle, MD  potassium chloride 20 MEQ TBCR Take 10 mEq by mouth daily. 07/13/20   Swayze, Ava, DO  sildenafil (VIAGRA) 100 MG tablet Take 0.5-1 tablets (50-100 mg total) by mouth daily as needed for erectile dysfunction. 05/09/20   Susy Frizzle, MD  sitaGLIPtin (JANUVIA) 50 MG tablet Take 25 mg by mouth daily.    [provider]  sodium bicarbonate 650 MG tablet Take 1,950 mg by mouth daily.     [provider]  torsemide (DEMADEX) 20 MG tablet Take 3 tablets (60 mg total) by mouth daily. 07/13/20   Swayze, Ava, DO    Allergies    Patient has no known allergies.  Review of Systems   Review of Systems Ten systems reviewed and are negative for acute change, except as noted in the HPI.   Physical Exam Updated Vital Signs Ht 5\' 8"  (1.727 m)   Wt 65.1 kg   BMI 21.83 kg/m   Physical Exam Vitals and nursing note reviewed.  Constitutional:      General: He is not in acute distress.    Appearance: He is well-developed. He is not diaphoretic.  HENT:     Head: Normocephalic and atraumatic.  Eyes:     General: No scleral icterus.    Conjunctiva/sclera: Conjunctivae normal.  Cardiovascular:     Rate and Rhythm: Normal rate and regular rhythm.     Heart sounds: Normal heart sounds.  Pulmonary:     Effort: Pulmonary effort is normal. No respiratory distress.     Breath sounds: Normal breath sounds.  Abdominal:     Palpations: Abdomen is soft.     Tenderness: There is no abdominal tenderness.  Musculoskeletal:     Cervical back: Normal range of motion and neck supple.  Skin:    General: Skin is warm and dry.  Neurological:     Mental Status: He is alert.     Sensory: No sensory deficit.     Motor: Tremor present. No weakness.     Deep Tendon Reflexes: Reflexes normal.     Comments: Patient is tremulous and twitching all  over No hyperreflexia or clonus  Psychiatric:        Behavior: Behavior normal.     ED Results / Procedures / Treatments   Labs (all labs ordered are listed, but only abnormal results are displayed) Labs Reviewed - No data to display  EKG None  Radiology No results found.  Procedures Procedures (including critical care time)  Medications Ordered in ED Medications - No data to display  ED Course  I have reviewed the triage vital signs and the nursing notes.  Pertinent labs & imaging results that were available during my care of the patient were reviewed by me and considered in my medical decision making (see chart for details).  Clinical Course as of Jul 19 1745  Mon Jul 18, 2020  1632 Patient's corrected calcium is 8.1   [AH]  1819 I spoke with Dr. Merlene Laughter who says that the patient should d/c his citalopram for the next 3 days and then start back at 1/2 the dose    [AH]    Clinical Course User Index [AH] Margarita Mail, PA-C   MDM Rules/Calculators/A&P                          Patient here with tremor and myoclonus. ddx includes hypocalcemia, hyponatremia, serotonin syndrome. Patient does not have focal deficits consistent with focal seizure.  initially I corrected the calcium with 1 g IV Calcium gluconate. This did not improve his sxs. Case discussed with Nephrology who states that the patient's calcium corrected is wnl.  I discussed the case with Dr. Denton Brick for admission she feels that this is likely secondary to the patient's soft however pram which initially he states he was not taking however his wife  confirms he is taking it and Dr. Theodoro Kos believes that due to his worsening renal function he is having symptomatic myoclonus.  He does not appear ill to have overt serotonin syndrome.  Was unable to ambulate as he was extremely shaky, weak and his legs buckled immediately when trying to stand.  It took a 2 person assist to have him stand from the bedside.   Patient will need admission.  Dr. Denton Brick has accepted. Final Clinical Impression(s) / ED Diagnoses Final diagnoses:  Hypocalcemia  AKI (acute kidney injury) [facility])    Rx / [place] Orders ED Discharge Orders    None       Margarita Mail, PA-C 07/19/20 1813    Elnora Morrison, MD 07/21/20 [phone]

## 2020-07-18 NOTE — ED Notes (Addendum)
Notified Dr. Regenia Skeeter that pts.  potassium is 5.2.

## 2020-07-18 NOTE — ED Notes (Signed)
ED TO INPATIENT HANDOFF REPORT  ED Nurse Name and Phone #:   S Name/Age/Gender Bradley Black 74 y.o. male Room/Bed: APA01/APA01  Code Status   Code Status: Prior  Home/SNF/Other Home Patient oriented to: self, place, time and situation Is this baseline? Yes   Triage Complete: Triage complete  Chief Complaint Fall [W19.XXXA]  Triage Note Pt brought in by [facility] with c/o tremors that started last night. Pt was seen by cardiologist today and pt was told he thought the tremors were coming from the pt's worsening kidney function. Denies pain. Pt has been falling more today due to the tremors.     Allergies No Known Allergies  Level of Care/Admitting Diagnosis ED Disposition    ED Disposition Condition [facility] Area: [facility] [[id number]]  Level of Care: Med-Surg [16]  Covid Evaluation: Asymptomatic Screening Protocol (No Symptoms)  Diagnosis: Fall [290176]  Admitting Physician: [name] [[id number]]  Attending Physician: [name] Nessa.Cuff       B Medical/Surgery History Past Medical History:  Diagnosis Date  . BPH (benign prostatic hyperplasia)   . CAD S/P percutaneous coronary angioplasty    a. PTCA of [place] b. PCI with BMS to LAD in 1997 c. RCA PCI [place] d. s/p CABG in 11/2011 with LIMA-LAD, SVG-PDA, SVG-OM2, and SVG-D1  . Chronic back pain   . CKD (chronic kidney disease), stage III ([place])   . Cyst of bursa    R shoulder  . Diabetic retinopathy ([place])   . DM (diabetes mellitus), type 2 with renal complications ([place])   . Essential hypertension   . Frequent PVCs   . GERD (gastroesophageal reflux disease)   . Ischemic cardiomyopathy 11/2011   Intra-OP TEE: EF 40-45%, no regional WMA; improved Anterior WM post CABG.  . Left carotid artery stenosis   . Mixed hyperlipidemia   . 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 BACK SURGERY  1970  . CARDIAC CATHETERIZATION  2013  . CORONARY ANGIOPLASTY  1994   OM  . CORONARY ANGIOPLASTY  1997   LAD  . CORONARY ANGIOPLASTY WITH STENT PLACEMENT  1999   RCA  . CORONARY ANGIOPLASTY WITH STENT PLACEMENT  2001   RCA  . CORONARY ARTERY BYPASS GRAFT  12/27/2011   Procedure: CORONARY ARTERY BYPASS GRAFTING (CABG);  Surgeon: Rexene Alberts, MD;  Location: [place];  Service: Open Heart Surgery;  Laterality: N/A;  Times four. On pump. Using endoscopically harvested right greater saphenous vein and left internal mammary artery.   [name] HERNIA REPAIR    . INCISION AND DRAINAGE OF WOUND  2006   [place]  . INTRAOPERATIVE TRANSESOPHAGEAL ECHOCARDIOGRAM  12/27/2011   Global hypokinesis with EF of 40-45%, improved LAD distribution wall motion.  [name] LEFT HEART CATHETERIZATION WITH CORONARY ANGIOGRAM N/A 12/13/2011   Procedure: LEFT HEART CATHETERIZATION WITH CORONARY ANGIOGRAM;  Surgeon: Leonie Man, MD;  Location: [facility] C[facility] LAB;  Service: Cardiovascular;  Laterality: N/A;  . LUMBAR FUSION    . MASS EXCISION  10/24/11   R arm  . RIGHT HEART CATH N/A 07/12/2020   Procedure: RIGHT HEART CATH;  Surgeon: Belva Crome, MD;  Location: [place] CV LAB;  Service: Cardiovascular;  Laterality: N/A;  . TRANSESOPHAGEAL ECHOCARDIOGRAM  2013  A IV Location/Drains/Wounds Patient Lines/Drains/Airways Status    Active Line/Drains/Airways    Name Placement date Placement time Site Days   Peripheral IV 07/18/20 Left Forearm 07/18/20  1427  Forearm  less than 1   Peripheral IV 07/18/20 Right Forearm 07/18/20  1506  Forearm  less than 1          Intake/Output Last 24 hours  Intake/Output Summary (Last 24 hours) at 07/18/2020 2040 Last data filed at 07/18/2020 1956 Gross per 24 hour  Intake 42.33 ml  Output --  Net 42.33 ml    Labs/Imaging Results for orders placed or performed during the hospital encounter of 07/18/20 (from the past 48 hour(s))   Comprehensive metabolic panel     Status: Abnormal   Collection Time: 07/18/20  3:10 PM  Result Value Ref Range   Sodium 136 135 - 145 mmol/L   Potassium 5.1 3.5 - 5.1 mmol/L   Chloride 97 (L) 98 - 111 mmol/L   CO2 27 22 - 32 mmol/L   Glucose, Bld 122 (H) 70 - 99 mg/dL    Comment: Glucose reference range applies only to samples taken after fasting for at least 8 hours.   BUN 96 (H) 8 - 23 mg/dL   Creatinine, Ser 4.17 (H) 0.61 - 1.24 mg/dL   Calcium 8.5 (L) 8.9 - 10.3 mg/dL   Total Protein 6.6 6.5 - 8.1 g/dL   Albumin 3.5 3.5 - 5.0 g/dL   AST 28 15 - 41 U/L   ALT 91 (H) 0 - 44 U/L   Alkaline Phosphatase 92 38 - 126 U/L   Total Bilirubin 1.5 (H) 0.3 - 1.2 mg/dL   GFR, Estimated 13 (L) >60 mL/min   Anion gap 12 5 - 15    Comment: Performed at [facility], [address] [place], [place] [address]  CBC     Status: Abnormal   Collection Time: 07/18/20  3:10 PM  Result Value Ref Range   WBC 8.2 4.0 - 10.5 K/uL   RBC 3.60 (L) 4.22 - 5.81 MIL/uL   Hemoglobin 11.3 (L) 13.0 - 17.0 g/dL   HCT 34.4 (L) 39 - 52 %   MCV 95.6 80.0 - 100.0 fL   MCH 31.4 26.0 - 34.0 pg   MCHC 32.8 30.0 - 36.0 g/dL   RDW 14.4 11.5 - 15.5 %   Platelets 178 150 - 400 K/uL   nRBC 0.0 0.0 - 0.2 %    Comment: Performed at [facility], [address] [place], [place] [address]  Magnesium     Status: None   Collection Time: 07/18/20  3:10 PM  Result Value Ref Range   Magnesium 2.2 1.7 - 2.4 mg/dL    Comment: Performed at [facility] : [facility], [address], [place], [place] [address]  Phosphorus     Status: Abnormal   Collection Time: 07/18/20  3:10 PM  Result Value Ref Range   Phosphorus 5.0 (H) 2.5 - 4.6 mg/dL    Comment: Performed at [facility], [address] [place], [place] [address]  I-stat chem 8, ED (not at [facility] or [facility])     Status: Abnormal   Collection Time: 07/18/20  3:14 PM  Result Value Ref Range   Sodium 137 135 - 145 mmol/L   Potassium 5.2 (H) 3.5 - 5.1 mmol/L   Chloride 97 (L) 98 - 111 mmol/L    BUN 96 (H) 8 - 23 mg/dL   Creatinine, Ser 4.50 (H) 0.61 - 1.24 mg/dL  Glucose, Bld 119 (H) 70 - 99 mg/dL    Comment: Glucose reference range applies only to samples taken after fasting for at least 8 hours.   Calcium, Ion 1.06 (L) 1.15 - 1.40 mmol/L   TCO2 31 22 - 32 mmol/L   Hemoglobin 12.2 (L) 13.0 - 17.0 g/dL   HCT 36.0 (L) 39 - 52 %   MR BRAIN WO CONTRAST  Result Date: 07/18/2020 CLINICAL DATA:  Acute neuro deficit.  Acute onset of tremors. EXAM: MRI HEAD WITHOUT CONTRAST TECHNIQUE: Multiplanar, multiecho pulse sequences of the brain and surrounding structures were obtained without intravenous contrast. COMPARISON:  MRI head 05/06/2018 FINDINGS: Brain: Moderate atrophy unchanged. Negative for hydrocephalus. Periventricular deep white matter hyperintensities bilaterally unchanged Negative for acute infarct, hemorrhage, mass Vascular: Normal arterial flow voids Skull and upper cervical spine: No focal skeletal lesion. Sinuses/Orbits: Paranasal sinuses clear.  Negative orbit Other: None IMPRESSION: No acute abnormality Moderate atrophy. Mild to moderate white matter changes consistent with chronic microvascular ischemia. Electronically Signed   By: Franchot Gallo M.D.   On: 07/18/2020 18:59    Pending Labs Unresulted Labs (From admission, onward)          Start     Ordered   07/18/20 1439  Resp Panel by RT PCR (RSV, Flu A&B, Covid) - Nasopharyngeal Swab  Once,   STAT       Question Answer Comment  Is this test for diagnosis or screening Screening   Symptomatic for COVID-19 as defined by CDC No   Hospitalized for COVID-19 No   Admitted to ICU for COVID-19 No   Previously tested for COVID-19 Yes   Resident in a congregate (group) care setting No   Employed in healthcare setting No   Has patient completed COVID vaccination(s) (2 doses of Pfizer/Moderna 1 dose of [employer]) Unknown      07/18/20 1438          Vitals/Pain Today's Vitals   07/18/20 1630 07/18/20 1700  07/18/20 1730 07/18/20 1800  BP: (!) 159/67 (!) 159/67 (!) 156/60 (!) 144/61  Pulse: (!) 58 64 (!) 56 (!) 59  Resp: 15 14 17 16   Temp:      TempSrc:      SpO2: 98% 97% 96% 97%  Weight:      Height:      PainSc:        Isolation Precautions No active isolations  Medications Medications  calcium gluconate 1 g/ 50 mL sodium chloride IVPB (0 g Intravenous Stopped 07/18/20 1956)    Mobility non-ambulatory High fall risk   Focused Assessments    R Recommendations: See Admitting Provider Note  Report given to:   Additional Notes:

## 2020-07-18 NOTE — H&P (Addendum)
History and Physical    Bradley Black ZOX:[id number] DOB: Oct 01, 1946 DOA: 07/18/2020  PCP: Susy Frizzle, MD   Patient coming from: Home  I have personally briefly reviewed patient's old medical records in [place]  Chief Complaint:  Involuntary twitching of extremities  HPI: Bradley Black is a 74 y.o. male with medical history significant for ischemic cardiomyopathy-systolic and diastolic CHF, hypertension, diabetes mellitus, CKD 5. Patient's went to the ED via EMS reports of uncontrollable tremors started last night.  Patient's cardiologist recommended ED evaluation due to concern for worsening renal function.    At the time of my evaluation patient is awake alert and oriented and able to give me a history.  He denies pain involving in his extremities, no weakness.  Patient tells me he has not been falling, reports here in the ED he is unable to stand, which has not happened in the past.  He ambulates without assistance or assistive device at baseline.  He has never had similar tremors twitching or jerking of his extremities in the past.  Reports he didn't sleep last night due to the tremors/jerks, so he is unaware if it stops when he sleeps.  Uncontrollable movements involve mostly his bilateral lower extremities, occasionally involves his upper extremities and body.  ED Course: Heart rate 50s to 60s, temperature 98.7, blood pressure 140s to 150s.  O2 sats greater than 96% on room air.  Potassium 5.1.  Calcium 8.5 corrected for albumin- 8.9.  Magnesium 2.2.  Phosphorus- 5.  EKG without significant change from prior.  Was initially called for admission for hypocalcemia, worsening renal function and involuntary twitching of lower extremities, but I felt that per patient calcium corrected-  8.9, actually the lower limit of normal would not account for patient's symptoms.  And patient's renal function to advance to stable.  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   But with patient unable to ambulate in the ED, requiring 2 person assist, decision was made to assess admit patient due to unsafe discharge.  Review of Systems: As per HPI all other systems reviewed and negative.  Past Medical History:  Diagnosis Date  . BPH (benign prostatic hyperplasia)   . CAD S/P percutaneous coronary angioplasty    a. PTCA of [place] b. PCI with BMS to LAD in 1997 c. RCA PCI [place] d. s/p CABG in 11/2011 with LIMA-LAD, SVG-PDA, SVG-OM2, and SVG-D1  . Chronic back pain   . CKD (chronic kidney disease), stage III ([place])   . Cyst of bursa    R shoulder  . Diabetic retinopathy ([place])   . DM (diabetes mellitus), type 2 with renal complications ([place])   . Essential hypertension   . Frequent PVCs   . GERD (gastroesophageal reflux disease)   . Ischemic cardiomyopathy 11/2011   Intra-OP TEE: EF 40-45%, no regional WMA; improved Anterior WM post CABG.  . Left carotid artery stenosis   . Mixed hyperlipidemia   . 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 BACK SURGERY  1970  . CARDIAC CATHETERIZATION  2013  . CORONARY ANGIOPLASTY  1994   OM  . CORONARY ANGIOPLASTY  1997   LAD  . CORONARY ANGIOPLASTY WITH STENT PLACEMENT  1999   RCA  . CORONARY ANGIOPLASTY WITH STENT PLACEMENT  2001   RCA  . CORONARY ARTERY BYPASS GRAFT  12/27/2011   Procedure: CORONARY ARTERY BYPASS GRAFTING (CABG);  Surgeon: Rexene Alberts, MD;  Location: [facility];  Service: Open Heart Surgery;  Laterality: N/A;  Times four. On pump. Using endoscopically harvested right greater saphenous vein and left internal mammary artery.   [name] HERNIA REPAIR    . INCISION AND DRAINAGE OF WOUND  2006   axilla  . INTRAOPERATIVE TRANSESOPHAGEAL  ECHOCARDIOGRAM  12/27/2011   Global hypokinesis with EF of 40-45%, improved LAD distribution wall motion.  [name] LEFT HEART CATHETERIZATION WITH CORONARY ANGIOGRAM N/A 12/13/2011   Procedure: LEFT HEART CATHETERIZATION WITH CORONARY ANGIOGRAM;  Surgeon: Leonie Man, MD;  Location: [facility] C[facility] LAB;  Service: Cardiovascular;  Laterality: N/A;  . LUMBAR FUSION    . MASS EXCISION  10/24/11   R arm  . RIGHT HEART CATH N/A 07/12/2020   Procedure: RIGHT HEART CATH;  Surgeon: Belva Crome, MD;  Location: [facility] CV LAB;  Service: Cardiovascular;  Laterality: N/A;  . TRANSESOPHAGEAL ECHOCARDIOGRAM  2013     reports that he quit smoking about 33 years ago. His smoking use included cigarettes. He has never used smokeless tobacco. He reports that he does not drink alcohol and does not use drugs.  No Known Allergies  Family History  Problem Relation Age of Onset  . Cancer Mother        breast  . Cancer Father        bone  . [place] cancer Neg Hx     Prior to Admission medications   Medication Sig Start Date End Date Taking? Authorizing Provider  acetaminophen (TYLENOL) 500 MG tablet Take 1,000 mg by mouth every 6 (six) hours as needed for moderate pain or headache.   Yes [provider]  amiodarone (PACERONE) 200 MG tablet Take 1 tablet (200 mg total) by mouth daily. 07/18/20  Yes Allred, Jeneen Rinks, MD  aspirin EC 81 MG tablet Take 81 mg by mouth daily.   Yes [provider]  calcitRIOL (ROCALTROL) 0.25 MCG capsule Take 0.25 mcg by mouth every Monday, Wednesday, and Friday.  06/03/20  Yes [provider]  cinacalcet (SENSIPAR) 30 MG tablet Take 30 mg by mouth daily with breakfast.  03/23/19  Yes [provider]  citalopram (CELEXA) 10 MG tablet TAKE ONE (1) [place] Patient taking differently: Take 10 mg by mouth daily.  08/24/19  Yes Susy Frizzle, MD  doxazosin (CARDURA) 8 MG tablet Take 1 tablet (8 mg total) by mouth daily. 02/17/20  Yes Susy Frizzle, MD  hydrALAZINE (APRESOLINE) 50 MG tablet Take 1.5 tablets (75 mg total) by mouth 3 (three) times daily. 07/13/20  Yes Swayze, Ava, DO  isosorbide mononitrate (IMDUR) 30 MG 24 hr tablet Take 1 tablet (30 mg total) by mouth daily. 07/14/20  Yes Swayze, Ava, DO  pantoprazole (PROTONIX) 40 MG tablet TAKE ONE TABLET BY MOUTH TWICE A DAY 09/07/19  Yes Susy Frizzle, MD  potassium chloride 20 MEQ TBCR Take 10 mEq by mouth daily. 07/13/20  Yes Swayze, Ava, DO  sildenafil (VIAGRA) 100 MG tablet Take 0.5-1 tablets (50-100 mg total) by mouth daily as needed for erectile dysfunction. 05/09/20  Yes Susy Frizzle, MD  sitaGLIPtin (JANUVIA) 50 MG tablet Take 25 mg by mouth daily.  Yes [provider]  sodium bicarbonate 650 MG tablet Take 1,950 mg by mouth daily.    Yes [provider]  torsemide (DEMADEX) 20 MG tablet Take 3 tablets (60 mg total) by mouth daily. 07/13/20  Yes Swayze, Ava, DO    Physical Exam: Vitals:   07/18/20 1630 07/18/20 1700 07/18/20 1730 07/18/20 1800  BP: (!) 159/67 (!) 159/67 (!) 156/60 (!) 144/61  Pulse: (!) 58 64 (!) 56 (!) 59  Resp: 15 14 17 16   Temp:      TempSrc:      SpO2: 98% 97% 96% 97%  Weight:      Height:        Constitutional: NAD, calm, comfortable Vitals:   07/18/20 1630 07/18/20 1700 07/18/20 1730 07/18/20 1800  BP: (!) 159/67 (!) 159/67 (!) 156/60 (!) 144/61  Pulse: (!) 58 64 (!) 56 (!) 59  Resp: 15 14 17 16   Temp:      TempSrc:      SpO2: 98% 97% 96% 97%  Weight:      Height:       Eyes: PERRL, lids and conjunctivae normal ENMT: Mucous membranes are moist.  Neck: normal, supple, no masses, no thyromegaly Respiratory: clear to auscultation bilaterally, no wheezing, no crackles. Normal respiratory effort. No accessory muscle use.  Cardiovascular: Regular rate and rhythm, no murmurs / rubs / gallops. No extremity edema. 2+ pedal pulses.   Abdomen: no tenderness, no masses palpated. No hepatosplenomegaly. Bowel sounds  positive.  Musculoskeletal: no clubbing / cyanosis. No joint deformity upper and lower extremities. Good ROM, no contractures. Normal muscle tone.  Skin: no rashes, lesions, ulcers. No induration Neurologic: CN 2-12 grossly intact. Sensation intact, Strength 4 involvement of bilateral upper extremities + /5 in all 4.  Persistent involuntary jerking movements of bilateral lower extremities, occasional involvement of bilateral upper extremities and torso. Psychiatric: Normal judgment and insight. Alert and oriented x 3. Normal mood.   Labs on Admission: I have personally reviewed following labs and imaging studies  CBC: Recent Labs  Lab 07/12/20 0829 07/12/20 0829 07/12/20 1543 07/12/20 1544 07/13/20 0118 07/18/20 1510 07/18/20 1514  WBC 6.6  --   --   --  5.7 8.2  --   HGB 10.9*   < > 10.9* 10.9* 10.4* 11.3* 12.2*  HCT 32.9*   < > 32.0* 32.0* 31.9* 34.4* 36.0*  MCV 95.4  --   --   --  96.7 95.6  --   PLT 125*  --   --   --  133* 178  --    < > = values in this interval not displayed.   Basic Metabolic Panel: Recent Labs  Lab 07/12/20 0829 07/12/20 0829 07/12/20 1543 07/12/20 1544 07/13/20 0118 07/18/20 1510 07/18/20 1514  NA 139   < > 142 142 141 136 137  K 3.6   < > 4.3 4.3 4.0 5.1 5.2*  CL 102  --   --   --  105 97* 97*  CO2 24  --   --   --  25 27  --   GLUCOSE 116*  --   --   --  160* 122* 119*  BUN 98*  --   --   --  88* 96* 96*  CREATININE 4.28*  --   --   --  3.85* 4.17* 4.50*  CALCIUM 8.7*  --   --   --  9.0 8.5*  --   MG 2.1  --   --   --   --  2.2  --   PHOS 4.6  --   --   --   --  5.0*  --    < > = values in this interval not displayed.   Liver Function Tests: Recent Labs  Lab 07/12/20 0829 07/13/20 0118 07/18/20 1510  AST 88* 70* 28  ALT 351* 288* 91*  ALKPHOS 91 94 92  BILITOT 2.0* 1.2 1.5*  PROT 6.2* 5.8* 6.6  ALBUMIN 3.3* 3.0* 3.5   CBG: Recent Labs  Lab 07/12/20 1618 07/12/20 2125 07/13/20 0620 07/13/20 1129 07/13/20 1615  GLUCAP 110*  208* 97 218* 80    Radiological Exams on Admission: MR BRAIN WO CONTRAST  Result Date: 07/18/2020 CLINICAL DATA:  Acute neuro deficit.  Acute onset of tremors. EXAM: MRI HEAD WITHOUT CONTRAST TECHNIQUE: Multiplanar, multiecho pulse sequences of the brain and surrounding structures were obtained without intravenous contrast. COMPARISON:  MRI head 05/06/2018 FINDINGS: Brain: Moderate atrophy unchanged. Negative for hydrocephalus. Periventricular deep white matter hyperintensities bilaterally unchanged Negative for acute infarct, hemorrhage, mass Vascular: Normal arterial flow voids Skull and upper cervical spine: No focal skeletal lesion. Sinuses/Orbits: Paranasal sinuses clear.  Negative orbit Other: None IMPRESSION: No acute abnormality Moderate atrophy. Mild to moderate white matter changes consistent with chronic microvascular ischemia. Electronically Signed   By: Franchot Gallo M.D.   On: 07/18/2020 18:59    EKG: Independently reviewed.  Sinus rhythm, QTC 499.  Q waves 2 3 aVF, but these are unchanged from prior EKG.  Assessment/Plan Principal Problem:   Fall Active Problems:   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   Ischemic cardiomyopathy   Essential hypertension   Type 2 diabetes mellitus with diabetic chronic kidney disease (HCC)   Chronic kidney disease, stage IV (severe) (HCC)  Involuntary movements of bilateral lower extremities/myoclonic jerks- patient denies falls. Electrolytes are unremarkable.  Corrected calcium of 8.9, magnesium 2.2, potassium 5.1.  Phosphorus 5.  MRI obtained without acute abnormality.  Patient is on citalopram, adverse effects of which include involuntary movements, tremors, myoclonus.  SSRIs versus his symptoms being from advanced renal insufficiency.  He is not exhibiting any other neurologic symptoms.  His vitals are stable.  Doubt serotonin syndrome at this time.  Patient unable to ambulate in ED requiring 2 person assist. -PT evaluation -Trial of  muscle relaxants with methocarbamol -IV Ativan 0.5x1-  no improvement, will give 1mg .  -Neurology consultation in a.m. if symptoms persistent  Diabetes mellitus-random glucose 122. Recent Hgba1c-5.7 - SSI -Hold home Januvia  Hypertension-blood pressure 140s to 150s. -Cardura, hydralazine, Imdur, torsemide  CKD 5-creatinine 4.1, baseline over the past 3.8-4.6. -Resume cinacalcet, calcitriol - Resume torsemide  Systolic and diastolic congestive heart failure-  last echo 06/2020 EF 25%, with grade 3 diastolic dysfunction restrictive. - resume torsemide  Hx of CABG x 4- Stable, no chest pain, EKG unchanged. -Resume aspirin, Imdur.  History of PVCs -Continue amiodarone   DVT prophylaxis: heparin Code Status: Full Code, confirmed with patient at bedside Family Communication: None at bedside.  ED provider had previously talked with spouse and his wife. Disposition Plan:  ~ 1 day Consults called: neurology consult Admission status: Obs ,med surg    [name] MD [facility]  07/18/2020, 8:20 PM

## 2020-07-18 NOTE — Patient Instructions (Addendum)
Medication Instructions:  1 Reduce your Amiodarone 200 mg daily  *If you need a refill on your cardiac medications before your next appointment, please call your pharmacy*  Lab Work: None ordered.  If you have labs (blood work) drawn today and your tests are completely normal, you will receive your results only by: [name] MyChart Message (if you have MyChart) OR . A paper copy in the mail If you have any lab test that is abnormal or we need to change your treatment, we will call you to review the results.  Testing/Procedures: None ordered.  Follow-Up: At [facility], you and your health needs are our priority.  As part of our continuing mission to provide you with exceptional heart care, we have created designated Provider Care Teams.  These Care Teams include your primary Cardiologist (physician) and Advanced Practice Providers (APPs -  Physician Assistants and Nurse Practitioners) who all work together to provide you with the care you need, when you need it.  We recommend signing up for the patient portal called "MyChart".  Sign up information is provided on this After Visit Summary.  MyChart is used to connect with patients for Virtual Visits (Telemedicine).  Patients are able to view lab/test results, encounter notes, upcoming appointments, etc.  Non-urgent messages can be sent to your provider as well.   To learn more about what you can do with MyChart, go to [email or web address].    Your next appointment:   Your physician wants you to follow-up in: Follow up as scheduled   Other Instructions:

## 2020-07-18 NOTE — ED Notes (Signed)
Attempted report x1. 

## 2020-07-18 NOTE — ED Notes (Signed)
Attempted to ambulate pt at bedside. One tech and I were able to get pt. To standing position. Pts. Legs began to [facility] and they lost their footing and pt. Was lowered back into the bed.

## 2020-07-18 NOTE — ED Triage Notes (Signed)
Pt brought in by [facility] with c/o tremors that started last night. Pt was seen by cardiologist today and pt was told he thought the tremors were coming from the pt's worsening kidney function. Denies pain. Pt has been falling more today due to the tremors.

## 2020-07-18 NOTE — Progress Notes (Signed)
PCP: Susy Frizzle, MD Primary Cardiologist: previously Dr Bronson Ing Primary EP: Dr Rayann Heman  Bradley Black is a 74 y.o. male who presents today for routine electrophysiology followup.  Since last being seen in our clinic, the patient was hospitalized with decompensated heart failure.  This occurred in the setting of advanced renal failure and abrupt increase in LFTs.  He was noted to have PVCs and was started on amiodarone.  Today, he denies symptoms of palpitations, chest pain, shortness of breath,  lower extremity edema, dizziness, presyncope, or syncope. His primary concern today is with myoclonic jerking which he has noticed over the weekend.  The patient is otherwise without complaint today.   Past Medical History:  Diagnosis Date  . BPH (benign prostatic hyperplasia)   . CAD S/P percutaneous coronary angioplasty    a. PTCA of [place] b. PCI with BMS to LAD in 1997 c. RCA PCI [place] d. s/p CABG in 11/2011 with LIMA-LAD, SVG-PDA, SVG-OM2, and SVG-D1  . Chronic back pain   . CKD (chronic kidney disease), stage III ([place])   . Cyst of bursa    R shoulder  . Diabetic retinopathy ([place])   . DM (diabetes mellitus), type 2 with renal complications ([place])   . Essential hypertension   . Frequent PVCs   . GERD (gastroesophageal reflux disease)   . Ischemic cardiomyopathy 11/2011   Intra-OP TEE: EF 40-45%, no regional WMA; improved Anterior WM post CABG.  . Left carotid artery stenosis   . Mixed hyperlipidemia   . 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 BACK SURGERY  1970  . CARDIAC CATHETERIZATION  2013  . CORONARY ANGIOPLASTY  1994   OM  . CORONARY ANGIOPLASTY  1997   LAD  . CORONARY ANGIOPLASTY WITH STENT PLACEMENT  1999   RCA  . CORONARY ANGIOPLASTY WITH STENT PLACEMENT  2001   RCA  . CORONARY ARTERY BYPASS GRAFT  12/27/2011   Procedure: CORONARY  ARTERY BYPASS GRAFTING (CABG);  Surgeon: Rexene Alberts, MD;  Location: [facility];  Service: Open Heart Surgery;  Laterality: N/A;  Times four. On pump. Using endoscopically harvested right greater saphenous vein and left internal mammary artery.   [name] HERNIA REPAIR    . INCISION AND DRAINAGE OF WOUND  2006   [place]  . INTRAOPERATIVE TRANSESOPHAGEAL ECHOCARDIOGRAM  12/27/2011   Global hypokinesis with EF of 40-45%, improved LAD distribution wall motion.  [name] LEFT HEART CATHETERIZATION WITH CORONARY ANGIOGRAM N/A 12/13/2011   Procedure: LEFT HEART CATHETERIZATION WITH CORONARY ANGIOGRAM;  Surgeon: Leonie Man, MD;  Location: [facility] CATH LAB;  Service: Cardiovascular;  Laterality: N/A;  . LUMBAR FUSION    . MASS EXCISION  10/24/11   R arm  . RIGHT HEART CATH N/A 07/12/2020   Procedure: RIGHT HEART CATH;  Surgeon: Belva Crome, MD;  Location: [facility] CV LAB;  Service: Cardiovascular;  Laterality: N/A;  . TRANSESOPHAGEAL ECHOCARDIOGRAM  2013    ROS- all systems are reviewed and negatives except as per HPI above  Current Outpatient Medications  Medication Sig Dispense Refill  . acetaminophen (TYLENOL) 500 MG tablet Take 1,000 mg by mouth every 6 (six) hours as needed for moderate pain or headache.    [name] amiodarone (PACERONE) 200 MG tablet Take 1 tablet (200 mg total) by  mouth 2 (two) times daily. 60 tablet 0  . aspirin EC 81 MG tablet Take 81 mg by mouth daily.    . calcitRIOL (ROCALTROL) 0.25 MCG capsule Take 0.25 mcg by mouth every Monday, Wednesday, and Friday.     . cinacalcet (SENSIPAR) 30 MG tablet Take 30 mg by mouth daily with breakfast.     . citalopram (CELEXA) 10 MG tablet TAKE ONE (1) TABLET BY MOUTH EVERY DAY (Patient taking differently: Take 10 mg by mouth daily. ) 90 tablet 3  . doxazosin (CARDURA) 8 MG tablet Take 1 tablet (8 mg total) by mouth daily. 90 tablet 3  . hydrALAZINE (APRESOLINE) 50 MG tablet Take 1.5 tablets (75 mg total) by mouth 3 (three) times daily. 135 tablet 0  .  isosorbide mononitrate (IMDUR) 30 MG 24 hr tablet Take 1 tablet (30 mg total) by mouth daily. 30 tablet 0  . pantoprazole (PROTONIX) 40 MG tablet TAKE ONE TABLET BY MOUTH TWICE A DAY 180 tablet 3  . potassium chloride 20 MEQ TBCR Take 10 mEq by mouth daily. 30 tablet 0  . sildenafil (VIAGRA) 100 MG tablet Take 0.5-1 tablets (50-100 mg total) by mouth daily as needed for erectile dysfunction. 60 tablet 5  . sitaGLIPtin (JANUVIA) 50 MG tablet Take 25 mg by mouth daily.    . sodium bicarbonate 650 MG tablet Take 1,950 mg by mouth daily.     [name] torsemide (DEMADEX) 20 MG tablet Take 3 tablets (60 mg total) by mouth daily. 90 tablet 0   No current facility-administered medications for this visit.    Physical Exam: Vitals:   07/18/20 0806  BP: 132/64  Pulse: (!) 55  SpO2: 97%  Weight: 143 lb 9.6 oz (65.1 kg)  Height: 5\' 8"  (1.727 m)    GEN- The patient is well appearing, alert and oriented x 3 today.   Head- normocephalic, atraumatic Eyes-  Sclera clear, conjunctiva pink Ears- hearing intact Oropharynx- clear Lungs- Clear to ausculation bilaterally, normal work of breathing Heart- Regular rate and rhythm, no ectopy on exam today GI- soft  Extremities- no clubbing, cyanosis, or edema Neuro- rare myoclonic jerking is noted,  Does not have asterixis  Wt Readings from Last 3 Encounters:  07/18/20 143 lb 9.6 oz (65.1 kg)  07/15/20 141 lb 12 oz (64.3 kg)  07/13/20 141 lb 11.2 oz (64.3 kg)    EKG tracing ordered today is personally reviewed and shows sinus rhythm 55 bpm, no PVCs today  Assessment and Plan:  1. PVCs He has had chronic PVCs and a chronic EF of 45%.  I am not convinced that his abrupt decline has anything to do with his PVCs. He is not currently a candidate for ablation. He has been started on amiodarone despite markedly elevated LFTs recently. As with my prior office visit, he has no PVCs on ekg today.  He also is not having them on auscultation. He will need close  follow while on amiodarone.  Hopefully this can be eventually weaned.  He has scheduled EP followup with Dr Lovena Le in [place].  2. Acute on chronic combined systolic and diastolic CHF I think that his recent decompensation is unlikely related to his very chronic PVCs.  More likely due to profound renal failure.  He may have fallen off of the starling curve.  I would anticipate clinical benefit with initiation of dialysis soon. He has scheduled follow-up in the advanced heart failure clinic. His QRS is narrow.  He is not a candidate for  CRT.  I would not advise ICD at this time given decompensated renal failure and CHF.  3. Acute on chronic (stage IV) renal failure Myoclonic jerking on exam may suggest more advanced renal failure.  He has follow-up with nephrology this week and will likely benefit from HD soon  Risks, benefits and potential toxicities for medications prescribed and/or refilled reviewed with patient today.   This patient has multiple organ failure with advanced CHF, renal failure, and recently elevated LFTs.   His prognosis is quite poor.  A high level of decision making is required for this encounter.  He will need close follow-up with the advanced heart failure team, general cardiology, and nephrology.  EP follow-up with Dr Lovena Le is scheduled for December.  Thompson Grayer MD, [facility] 07/18/2020 8:14 AM

## 2020-07-19 ENCOUNTER — Observation Stay (HOSPITAL_COMMUNITY)
Admit: 2020-07-19 | Discharge: 2020-07-19 | Disposition: A | Discharge status: Home / Self Care | Payer: Medicare Other | Attending: Internal Medicine | Admitting: Internal Medicine

## 2020-07-19 DIAGNOSIS — I10 Essential (primary) hypertension: Secondary | ICD-10-CM | POA: Diagnosis not present

## 2020-07-19 DIAGNOSIS — N184 Chronic kidney disease, stage 4 (severe): Secondary | ICD-10-CM

## 2020-07-19 DIAGNOSIS — N185 Chronic kidney disease, stage 5: Secondary | ICD-10-CM

## 2020-07-19 DIAGNOSIS — G253 Myoclonus: Secondary | ICD-10-CM | POA: Diagnosis not present

## 2020-07-19 DIAGNOSIS — I255 Ischemic cardiomyopathy: Secondary | ICD-10-CM

## 2020-07-19 DIAGNOSIS — E1122 Type 2 diabetes mellitus with diabetic chronic kidney disease: Secondary | ICD-10-CM

## 2020-07-19 DIAGNOSIS — R569 Unspecified convulsions: Secondary | ICD-10-CM | POA: Diagnosis not present

## 2020-07-19 LAB — GLUCOSE, CAPILLARY
Glucose-Capillary: 106 mg/dL — ABNORMAL HIGH (ref 70–99)
Glucose-Capillary: 187 mg/dL — ABNORMAL HIGH (ref 70–99)
Glucose-Capillary: 86 mg/dL (ref 70–99)

## 2020-07-19 MED ORDER — MAGNESIUM OXIDE 400 MG PO TABS: 200.0000 mg | ORAL_TABLET | Freq: Every day | ORAL | 0 refills | Status: DC

## 2020-07-19 MED ORDER — CALCITRIOL 0.25 MCG PO CAPS
0.5000 ug | ORAL_CAPSULE | ORAL | Status: DC
  Filled 2020-07-19: qty 2

## 2020-07-19 MED ORDER — CALCITRIOL 0.5 MCG PO CAPS: 0.5000 ug | ORAL_CAPSULE | ORAL | 1 refills | Status: DC

## 2020-07-19 MED ORDER — CALCIUM GLUCONATE-NACL 1-0.675 GM/50ML-% IV SOLN
1.0000 g | Freq: Once | INTRAVENOUS | Status: AC
  Administered 2020-07-19: 1000 mg via INTRAVENOUS
  Filled 2020-07-19: qty 50

## 2020-07-19 NOTE — TOC Transition Note (Signed)
Transition of Care [facility]) - CM/SW Discharge Note   Patient Details  Name: Bradley Black MRN: [id number] Date of Birth: 27-Mar-1946  [facility] CM/SW Contact:  Shade Flood, LCSW Phone Number: 07/19/2020, 4:21 PM   Clinical Narrative:     Pt here observation status from home. He is discharging today and will return home with his wife who assists in his care. PT recommending [place] pt and RW. Spoke with pt's wife who is in agreement with both. RW delivered to pt's room by TOC. Arranged HH PT with [facility]. They will call pt to schedule visits.  There are no other TOC needs for dc.  Final next level of care: [place]     Patient Goals and CMS Choice   CMS Medicare.gov Compare Post Acute Care list provided to:: Patient Represenative (must comment) Choice offered to / list presented to : Spouse  Discharge Placement                       Discharge Plan and Services In-house Referral: Clinical Social Work   Post Acute Care Choice: Home Health          DME Arranged: [place] Rile rolling DME Agency: [employer] Date DME Agency Contacted: 07/19/20     HH Arranged: PT [place] Agency: [place] Date [facility] Agency Contacted: 07/19/20   Representative spoke with at [place]: [place] ([place]) Interventions     Readmission Risk Interventions No flowsheet data found.

## 2020-07-19 NOTE — Discharge Summary (Signed)
Physician Discharge Summary  Bradley Black PFX:[id number] DOB: [birth year] DOA: 07/18/2020  PCP: Bradley Frizzle, MD  Admit date: 07/18/2020 Discharge date: 07/19/2020  Time spent: 35 minutes  Recommendations for Outpatient Follow-up:  Repeat basic metabolic panel to evaluate lites and renal function Check phosphorus and magnesium level Follow-up with nephrology and cardiology service as an outpatient.  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  Discharged home with instruction to follow-up with PCP, nephrologist and cardiology service as an outpatient.  CODE STATUS: Full code.  Diet recommendation: Heart healthy/low-sodium diet.  Filed Weights   07/18/20 1425 07/18/20 2305  Weight: 65.1 kg 64.8 kg    History of present illness:  As per H&P written by Dr. Denton Black on 07/18/2020 Bradley Black is a 74 y.o. male with medical history significant for ischemic cardiomyopathy-systolic and diastolic CHF, hypertension, diabetes mellitus, CKD 5. Patient's went to the ED via EMS reports of uncontrollable tremors started last night.  Patient's cardiologist recommended ED evaluation due to concern for worsening renal function.    At the time of my evaluation patient is awake alert and oriented and able to give me a history.  He denies pain involving in his extremities, no weakness.  Patient tells me he has not been falling, reports here in the ED he is unable to stand, which has not happened in the past.  He ambulates without assistance or assistive device at baseline.  He has never had similar tremors twitching or jerking of his extremities in the past.  Reports he didn't sleep last  night due to the tremors/jerks, so he is unaware if it stops when he sleeps.  Uncontrollable movements involve mostly his bilateral lower extremities, occasionally involves his upper extremities and body.  ED Course: Heart rate 50s to 60s, temperature 98.7, blood pressure 140s to 150s.  O2 sats greater than 96% on room air.  Potassium 5.1.  Calcium 8.5 corrected for albumin- 8.9.  Magnesium 2.2.  Phosphorus- 5.  EKG without significant change from prior.  Was initially called for admission for hypocalcemia, worsening renal function and involuntary twitching of lower extremities, but I felt that per patient calcium corrected-  8.9, actually the lower limit of normal would not account for patient's symptoms.  And patient's renal function to advance to stable.  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   But with patient unable to ambulate in the ED, requiring 2 person assist, decision was made to assess admit patient due to unsafe discharge.  Hospital Course:  1-myoclonic jerks -Most likely in correlation with electrolytes on normalities on the use of Celexa -Celexa has been discontinued -Electrolytes has been repleted and maintenance supplementation provided -Patient seen by physical therapy who felt stable to go home with home health PT and the use of a rolling walker -Patient will maintain adequate hydration and follow-up with his PCP and nephrologist as an outpatient.  2-type 2 diabetes mellitus with nephropathy -Most recent A1c 5.7 -Advised to maintain modified carbohydrate diet and to resume home oral hypoglycemic agents -Outpatient  follow-up with PCP for further adjustments.  3-essential hypertension -Stable and well-controlled -Continue the use of hydralazine, imdur, torsemide and Cardura -Heart healthy diet has been encouraged and  emphasized.  4-chronic kidney disease a stage V -Patient baseline creatinine 3.8-4.6 -At discharge 4.1 -Continue current dose of diuretics, calcitriol, sodium bicarbonate and cinacalcet -Outpatient follow-up with nephrology service -Patient very close to require dialysis.  5-chronic systolic and diastolic heart failure -Most recent ejection fraction 25% with a grade 3 diastolic dysfunction -Continue outpatient follow-up with cardiology service/heart failure clinic -Low-sodium diet emphasized along with the importance of daily weights -Patient instructed to maintain adequate hydration -Continue the use of hydralazine, Imdur and torsemide.  6-history of CABG x4 -No chest pain or shortness of breath -Continue the use of aspirin and Imdur -Outpatient follow-up with cardiology service.  7-prior history of PVCs/NSVT  -Rate controlled in sinus rhythm -Continue the use of amiodarone -Close monitoring of patient's electrolytes with intention to keep potassium above 4.0 and magnesium above 2.0 -Continue patient follow-up with cardiology service.  8-BPH -Continue Cardura -Reports no symptoms of obstruction/urinary retention currently.    Procedures: See below for x-ray report. Consultations:  None  Discharge Exam: Vitals:   07/19/20 0115 07/19/20 0528  BP: (!) 165/58 (!) 155/75  Pulse: 63 62  Resp: 15 17  Temp: 98.3 F (36.8 C) 97.8 F (36.6 C)  SpO2: 95% 95%    General: Afebrile, no chest pain, no nausea, no vomiting. Reports feeling better and having just mild intermittent jerking spells. Patient was able to participate with physical therapy evaluation and recommendations given for home health PT. Cardiovascular: Rate controlled, no rubs, no gallops, no JVD. Respiratory: Good air movement bilaterally, no wheezing, no crackles; no using accessory muscles Abdomen: Soft, nontender, distended, positive bowel sounds Extremities: No cyanosis or clubbing.  Discharge  Instructions   Discharge Instructions    (HEART FAILURE PATIENTS) Call MD:  Anytime you have any of the following symptoms: 1) 3 pound weight gain in 24 hours or 5 pounds in 1 week 2) shortness of breath, with or without a dry hacking cough 3) swelling in the hands, feet or stomach 4) if you have to sleep on extra pillows at night in order to breathe.   Complete by: As directed    Diet - low sodium heart healthy   Complete by: As directed    Discharge instructions   Complete by: As directed    Follow low-sodium diet (less than 2 g of sodium daily) Maintain adequate hydration Take medications as prescribed Follow-up with nephrology (Dr. Theador Hawthorne) and with your cardiologist as previously instructed. Check your weight on daily basis.     Allergies as of 07/19/2020   No Known Allergies     Medication List    STOP taking these medications   citalopram 10 MG tablet Commonly known as: CELEXA   Potassium Chloride ER 20 MEQ Tbcr     TAKE these medications   acetaminophen 500 MG tablet Commonly known as: TYLENOL Take 1,000 mg by mouth every 6 (six) hours as needed for moderate pain or headache.   amiodarone 200 MG tablet Commonly known as: PACERONE Take 1 tablet (200 mg total) by mouth daily.   aspirin EC 81 MG tablet Take 81 mg by mouth daily.   calcitRIOL 0.5 MCG capsule Commonly known as: ROCALTROL Take 1 capsule (0.5 mcg total) by mouth every Monday, Wednesday, and Friday. Start taking on: July 20, 2020 What changed:   medication strength  how much  to take   cinacalcet 30 MG tablet Commonly known as: SENSIPAR Take 30 mg by mouth daily with breakfast.   doxazosin 8 MG tablet Commonly known as: CARDURA Take 1 tablet (8 mg total) by mouth daily.   hydrALAZINE 50 MG tablet Commonly known as: APRESOLINE Take 1.5 tablets (75 mg total) by mouth 3 (three) times daily.   isosorbide mononitrate 30 MG 24 hr tablet Commonly known as: IMDUR Take 1 tablet (30 mg total)  by mouth daily.   magnesium oxide 400 MG tablet Commonly known as: MAG-OX Take 0.5 tablets (200 mg total) by mouth daily.   pantoprazole 40 MG tablet Commonly known as: PROTONIX TAKE ONE TABLET BY MOUTH TWICE A DAY   sildenafil 100 MG tablet Commonly known as: Viagra Take 0.5-1 tablets (50-100 mg total) by mouth daily as needed for erectile dysfunction.   sitaGLIPtin 50 MG tablet Commonly known as: JANUVIA Take 25 mg by mouth daily.   sodium bicarbonate 650 MG tablet Take 1,950 mg by mouth daily.   torsemide 20 MG tablet Commonly known as: Demadex Take 3 tablets (60 mg total) by mouth daily.      No Known Allergies  Follow-up Information    Bradley Frizzle, MD. Schedule an appointment as soon as possible for a visit in 10 day(s).   Specialty: Family Medicine Contact information: [address] [place] [address] [phone]        Thompson Grayer, MD .   Specialty: Cardiology Contact information: [place] [address] [phone]               The results of significant diagnostics from this hospitalization (including imaging, microbiology, ancillary and laboratory) are listed below for reference.    Significant Diagnostic Studies: MR BRAIN WO CONTRAST  Result Date: 07/18/2020 CLINICAL DATA:  Acute neuro deficit.  Acute onset of tremors. EXAM: MRI HEAD WITHOUT CONTRAST TECHNIQUE: Multiplanar, multiecho pulse sequences of the brain and surrounding structures were obtained without intravenous contrast. COMPARISON:  MRI head 05/06/2018 FINDINGS: Brain: Moderate atrophy unchanged. Negative for hydrocephalus. Periventricular deep white matter hyperintensities bilaterally unchanged Negative for acute infarct, hemorrhage, mass Vascular: Normal arterial flow voids Skull and upper cervical spine: No focal skeletal lesion. Sinuses/Orbits: Paranasal sinuses clear.  Negative orbit Other: None IMPRESSION: No acute abnormality Moderate  atrophy. Mild to moderate white matter changes consistent with chronic microvascular ischemia. Electronically Signed   By: Franchot Gallo M.D.   On: 07/18/2020 18:59   CARDIAC CATHETERIZATION  Result Date: 07/12/2020  Mildly elevated mean pulmonary artery pressure at 21 mmHg.  This is consistent with mild pulmonary hypertension.  Pulmonary capillary wedge pressure mean is 11 mmHg.  Cardiac output 4.6 L/min.  Main pulmonary artery O2 saturation 64%.  Arterial pulse oximetry O2 saturation 99%.  Pulmonary vascular resistance 2.16 Wood units.  US RENAL  Result Date: 07/11/2020 CLINICAL DATA:  Acute kidney injury. EXAM: RENAL / URINARY TRACT ULTRASOUND COMPLETE COMPARISON:  03/14/2017. FINDINGS: Right Kidney: Renal measurements: 8.5 x 4.6 x 3.5 cm = volume: 72 mL. Diffusely increased echogenicity without hydronephrosis. Left Kidney: Renal measurements: 9.8 x 5.4 x 4.5 cm = volume: 126 mL. Echogenic parenchyma with 2.9 cm cystic lesion interpolar region not substantially changed from 2.6 cm on ultrasound exam of 03/14/2017. Bladder: Appears normal for degree of bladder distention. Other: None. IMPRESSION: 1. No acute findings. No hydronephrosis. 2. 2.9 cm cyst interpolar region left kidney not substantially changed since 2018. 3. Increased echogenicity  of both kidneys compatible with medical renal disease. Electronically Signed   By: Misty Stanley M.D.   On: 07/11/2020 10:53   DG Chest Port 1 View  Result Date: 07/08/2020 CLINICAL DATA:  Shortness of breath EXAM: PORTABLE CHEST 1 VIEW COMPARISON:  01/21/2012, 12/31/2011 FINDINGS: Post CABG changes. Stable mild cardiomegaly. There is mild pulmonary vascular congestion and prominent interstitial markings. No large pleural fluid collection. No pneumothorax. IMPRESSION: Findings suggestive of CHF with mild interstitial edema. Electronically Signed   By: Davina Poke D.O.   On: 07/08/2020 14:12   ECHOCARDIOGRAM COMPLETE  Result Date: 07/11/2020     ECHOCARDIOGRAM REPORT   Patient Name:   [place] Date of Exam: 07/09/2020 Medical Rec #:  [id number]     Height:       68.0 in Accession #:    [id number]    Weight:       160.0 lb Date of Birth:  1945/10/19     BSA:          1.859 m Patient Age:    28 years      BP:           172/96 mmHg Patient Gender: M             HR:           61 bpm. Exam Location:  [facility] Procedure: 2D Echo Indications:    CHF-Acute Systolic  History:        Patient has prior history of Echocardiogram examinations, most                 recent 10/27/2018. CAD, Prior CABG; Risk Factors:Hypertension,                 Diabetes and Dyslipidemia.  Sonographer:    Mikki Santee RDCS (AE) Referring Phys: [place]  1. Compared to previous echo, LVEF and RVEF are worse . Left ventricular ejection fraction, by estimation, is 25%%. The left ventricle has severely decreased function. The left ventricle demonstrates global hypokinesis. The left ventricular internal cavity size was moderately dilated. Left ventricular diastolic parameters are consistent with Grade III diastolic dysfunction (restrictive). Elevated left atrial pressure.  2. Right ventricular systolic function is severely reduced. The right ventricular size is mildly enlarged. There is moderately elevated pulmonary artery systolic pressure.  3. Left atrial size was severely dilated.  4. Right atrial size was moderately dilated.  5. The mitral valve is normal in structure. Mild to moderate mitral valve regurgitation.  6. The aortic valve is tricuspid. Aortic valve regurgitation is trivial.  7. The inferior vena cava is dilated in size with <50% respiratory variability, suggesting right atrial pressure of 15 mmHg. FINDINGS  Left Ventricle: Compared to previous echo, LVEF and RVEF are worse. Left ventricular ejection fraction, by estimation, is 25%%. The left ventricle has severely decreased function. The left ventricle demonstrates global hypokinesis. The left  ventricular internal cavity size was moderately dilated. There is no left ventricular hypertrophy. Left ventricular diastolic parameters are consistent with Grade III diastolic dysfunction (restrictive). Elevated left atrial pressure. Right Ventricle: The right ventricular size is mildly enlarged. No increase in right ventricular wall thickness. Right ventricular systolic function is severely reduced. There is moderately elevated pulmonary artery systolic pressure. The tricuspid regurgitant velocity is 3.01 m/s, and with an assumed right atrial pressure of 15 mmHg, the estimated right ventricular systolic pressure is 82.9 mmHg. Left Atrium: Left atrial size was severely dilated. Right Atrium:  Right atrial size was moderately dilated. Pericardium: There is no evidence of pericardial effusion. Mitral Valve: The mitral valve is normal in structure. Mild to moderate mitral valve regurgitation. Tricuspid Valve: The tricuspid valve is normal in structure. Tricuspid valve regurgitation is mild. Aortic Valve: The aortic valve is tricuspid. Aortic valve regurgitation is trivial. Pulmonic Valve: The pulmonic valve was not well visualized. Pulmonic valve regurgitation is not visualized. No evidence of pulmonic stenosis. Aorta: The aortic root is normal in size and structure. Venous: The inferior vena cava is dilated in size with less than 50% respiratory variability, suggesting right atrial pressure of 15 mmHg. IAS/Shunts: The interatrial septum was not assessed.  LEFT VENTRICLE PLAX 2D LVIDd:         5.69 cm  Diastology LVIDs:         5.12 cm  LV e' medial:    3.00 cm/s LV PW:         1.03 cm  LV E/e' medial:  25.1 LV IVS:        1.02 cm  LV e' lateral:   5.35 cm/s LVOT diam:     2.10 cm  LV E/e' lateral: 14.1 LVOT Area:     3.46 cm  RIGHT VENTRICLE RV S prime:     4.50 cm/s TAPSE (M-mode): 0.6 cm LEFT ATRIUM            Index       RIGHT ATRIUM           Index LA diam:      5.10 cm  2.74 cm/m  RA Area:     23.10 cm LA Vol  (A4C): 105.0 ml 56.49 ml/m RA Volume:   69.30 ml  37.28 ml/m   AORTA Ao Root diam: 3.40 cm MITRAL VALVE               TRICUSPID VALVE MV Area (PHT): 3.48 cm    TR Peak grad:   36.2 mmHg MV Decel Time: 218 msec    TR Vmax:        301.00 cm/s MR Peak grad: 95.6 mmHg MR Mean grad: 61.0 mmHg    SHUNTS MR Vmax:      489.00 cm/s  Systemic Diam: 2.10 cm MR Vmean:     370.0 cm/s MV E velocity: 75.30 cm/s MV A velocity: 45.90 cm/s MV E/A ratio:  1.64 Dorris Carnes MD Electronically signed by Dorris Carnes MD Signature Date/Time: 07/11/2020/11:23:09 AM    Final    US Abdomen Limited RUQ  Result Date: 07/09/2020 CLINICAL DATA:  Elevated liver function tests. EXAM: ULTRASOUND ABDOMEN LIMITED RIGHT UPPER QUADRANT COMPARISON:  None. FINDINGS: Gallbladder: No gallstones or wall thickening visualized. No sonographic Murphy sign noted by sonographer. Common bile duct: Diameter: Normal, 6 mm. Liver: No focal lesion identified. Within normal limits in parenchymal echogenicity. Portal vein is patent on color Doppler imaging with normal direction of blood flow towards the liver. Other: Perihepatic small volume ascites. Right-sided pleural effusion. Increased echogenicity within the right kidney incidentally noted. IMPRESSION: No acute process or explanation for elevated liver function tests. Small volume ascites and right pleural effusion, suggesting fluid overload. Increased renal echogenicity, likely related to medical renal disease. Electronically Signed   By: Abigail Miyamoto M.D.   On: 07/09/2020 11:44    Microbiology: Recent Results (from the past 240 hour(s))  MRSA PCR Screening     Status: None   Collection Time: 07/09/20  8:45 PM   Specimen: Nasal Mucosa; Nasopharyngeal  Result  Value Ref Range Status   MRSA by PCR NEGATIVE NEGATIVE Final    Comment:        The GeneXpert MRSA Assay (FDA approved for NASAL specimens only), is one component of a comprehensive MRSA colonization surveillance program. It is not intended  to diagnose MRSA infection nor to guide or monitor treatment for MRSA infections. Performed at [facility], [address], [place], [place] [address]      Labs: Basic Metabolic Panel: Recent Labs  Lab 07/12/20 1543 07/12/20 1544 07/13/20 0118 07/18/20 1510 07/18/20 1514  NA 142 142 141 136 137  K 4.3 4.3 4.0 5.1 5.2*  CL  --   --  105 97* 97*  CO2  --   --  25 27  --   GLUCOSE  --   --  160* 122* 119*  BUN  --   --  88* 96* 96*  CREATININE  --   --  3.85* 4.17* 4.50*  CALCIUM  --   --  9.0 8.5*  --   MG  --   --   --  2.2  --   PHOS  --   --   --  5.0*  --    Liver Function Tests: Recent Labs  Lab 07/13/20 0118 07/18/20 1510  AST 70* 28  ALT 288* 91*  ALKPHOS 94 92  BILITOT 1.2 1.5*  PROT 5.8* 6.6  ALBUMIN 3.0* 3.5   CBC: Recent Labs  Lab 07/12/20 1543 07/12/20 1544 07/13/20 0118 07/18/20 1510 07/18/20 1514  WBC  --   --  5.7 8.2  --   HGB 10.9* 10.9* 10.4* 11.3* 12.2*  HCT 32.0* 32.0* 31.9* 34.4* 36.0*  MCV  --   --  96.7 95.6  --   PLT  --   --  133* 178  --    BNP (last 3 results) Recent Labs    07/08/20 1329  BNP >4,500.0*   CBG: Recent Labs  Lab 07/13/20 1129 07/13/20 1615 07/18/20 2302 07/19/20 0755 07/19/20 1113  GLUCAP 218* 80 82 106* 187*   Signed:  Barton Dubois MD.  [facility] 07/19/2020, 3:38 PM

## 2020-07-19 NOTE — Progress Notes (Signed)
EEG Completed; Results Pending  

## 2020-07-19 NOTE — Care Management Obs Status (Signed)
[place] NOTIFICATION   Patient Details  Name: Bradley Black MRN: [id number] Date of Birth: 1946/01/22   Medicare Observation Status Notification Given:  Yes    Tommy Medal 07/19/2020, 3:03 PM

## 2020-07-19 NOTE — Consult Note (Signed)
[place] A. Merlene Laughter, MD     [email or web address]          Bradley Black is an 74 y.o. male.   ASSESSMENT/PLAN: 1.  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  The possible culprits includes medication effect especially SSRI citalopram and renal failure.  It is suggested that the citalopram be reduced after holding for a couple days.  The dose should be reduced in half.  Ultimately, this medication can be discontinued if not needed.  An EEG has been obtained and this will be followed but is unlikely that the spells are due to seizures.     The patient is a 74 year old white male who was recently discharged from this hospital for acute congestive heart failure.  He was given medication to help with this and it is felt that it may have been his baseline chronic renal failure worse.  The wife reports that he has lost about 20 pounds with the medications given.  He was being seen by his cardiologist a couple days ago when he the wife reports that he started having jerking movements.  It appears that the jerking movements worse after the patient went home.  He reports having diffuse jerky activity to the point that his legs became weak and he fell.  He sustained mild head injuries resulted in the patient being sent to the hospital for further evaluation.  No loss of consciousness is reported.  No dysarthria, dysphagia or focal weakness.  No headaches or dizziness are reported.  Creatinine has remained relatively stable with a baseline anywhere from 3.8-4.2.  Admission creatinine 4.5 slightly elevated over baseline.  No other metabolic derangements are noted in the work-up.  The review of systems is otherwise unrevealing.    GENERAL: This is a thin pleasant male who is in no acute distress at this time.  HEENT: Neck is supple and no trauma noted.  ABDOMEN: soft  EXTREMITIES: No  edema   BACK: Normal  SKIN: Normal by inspection.    MENTAL STATUS: Alert and oriented. Speech, language and cognition are generally intact. Judgment and insight normal.   CRANIAL NERVES: Pupils are equal, round and reactive to light and accomodation; extra ocular movements are full, there is no significant nystagmus; visual fields are full; upper and lower facial muscles are normal in strength and symmetric, there is no flattening of the nasolabial folds; tongue is midline; uvula is midline; shoulder elevation is normal.  MOTOR: Normal tone, bulk and strength; no pronator drift.  COORDINATION: There is continuous jerky movements consistent with myoclonus and negative myoclonus of the lower extremities.  I was also able to elicit some negative myoclonus of the outstretched upper extremities.  No tremors are noted and no parkinsonian symptoms.  No rigidity or bradykinesia.  REFLEXES: Deep tendon reflexes are symmetrical and normal.   SENSATION: Normal to light touch, temperature, and pain.     Blood pressure (!) 155/75, pulse 62, temperature 97.8 F (36.6 C), resp. rate 17, height 5\' 8"  (1.727 m), weight 64.8 kg, SpO2 95 %.  Past Medical History:  Diagnosis Date  . BPH (benign prostatic hyperplasia)   . CAD S/P percutaneous coronary angioplasty    a. PTCA of [place] b. PCI with BMS to LAD in 1997 c. RCA PCI [place] d. s/p CABG in 11/2011 with LIMA-LAD, SVG-PDA, SVG-OM2, and SVG-D1  . Chronic back  pain   . CKD (chronic kidney disease), stage III ([place])   . Cyst of bursa    R shoulder  . Diabetic retinopathy ([place])   . DM (diabetes mellitus), type 2 with renal complications ([place])   . Essential hypertension   . Frequent PVCs   . GERD (gastroesophageal reflux disease)   . Ischemic cardiomyopathy 11/2011   Intra-OP TEE: EF 40-45%, no regional WMA; improved Anterior WM post CABG.  . Left carotid artery stenosis   . Mixed hyperlipidemia   . 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 BACK SURGERY  1970  . CARDIAC CATHETERIZATION  2013  . CORONARY ANGIOPLASTY  1994   OM  . CORONARY ANGIOPLASTY  1997   LAD  . CORONARY ANGIOPLASTY WITH STENT PLACEMENT  1999   RCA  . CORONARY ANGIOPLASTY WITH STENT PLACEMENT  2001   RCA  . CORONARY ARTERY BYPASS GRAFT  12/27/2011   Procedure: CORONARY ARTERY BYPASS GRAFTING (CABG);  Surgeon: Rexene Alberts, MD;  Location: [facility];  Service: Open Heart Surgery;  Laterality: N/A;  Times four. On pump. Using endoscopically harvested right greater saphenous vein and left internal mammary artery.   [name] HERNIA REPAIR    . INCISION AND DRAINAGE OF WOUND  2006   [place]  . INTRAOPERATIVE TRANSESOPHAGEAL ECHOCARDIOGRAM  12/27/2011   Global hypokinesis with EF of 40-45%, improved LAD distribution wall motion.  [name] LEFT HEART CATHETERIZATION WITH CORONARY ANGIOGRAM N/A 12/13/2011   Procedure: LEFT HEART CATHETERIZATION WITH CORONARY ANGIOGRAM;  Surgeon: Leonie Man, MD;  Location: [facility] C[facility] LAB;  Service: Cardiovascular;  Laterality: N/A;  . LUMBAR FUSION    . MASS EXCISION  10/24/11   R arm  . RIGHT HEART CATH N/A 07/12/2020   Procedure: RIGHT HEART CATH;  Surgeon: Belva Crome, MD;  Location: [facility] CV LAB;  Service: Cardiovascular;  Laterality: N/A;  . TRANSESOPHAGEAL ECHOCARDIOGRAM  2013    Family History  Problem Relation Age of Onset  . Cancer Mother        breast  . Cancer Father        bone  . Karen cancer Neg Hx     Social History:  reports that he quit smoking about 33 years ago. His smoking use included cigarettes. He has never used smokeless tobacco. He reports that he does not drink alcohol and does not use drugs.  Allergies: No Known Allergies  Medications: Prior to Admission medications   Medication Sig Start Date End Date Taking? Authorizing Provider  acetaminophen (TYLENOL) 500 MG tablet  Take 1,000 mg by mouth every 6 (six) hours as needed for moderate pain or headache.   Yes [provider]  amiodarone (PACERONE) 200 MG tablet Take 1 tablet (200 mg total) by mouth daily. 07/18/20  Yes Allred, Jeneen Rinks, MD  aspirin EC 81 MG tablet Take 81 mg by mouth daily.   Yes [provider]  cinacalcet (SENSIPAR) 30 MG tablet Take 30 mg by mouth daily with breakfast.  03/23/19  Yes [provider]  citalopram (CELEXA) 10 MG tablet TAKE ONE (1) [place] Patient taking differently: Take 10 mg by mouth daily.  08/24/19  Yes Susy Frizzle, MD  doxazosin (CARDURA) 8 MG tablet Take 1 tablet (8 mg total) by mouth daily. 02/17/20  Yes  Susy Frizzle, MD  hydrALAZINE (APRESOLINE) 50 MG tablet Take 1.5 tablets (75 mg total) by mouth 3 (three) times daily. 07/13/20  Yes Swayze, Ava, DO  isosorbide mononitrate (IMDUR) 30 MG 24 hr tablet Take 1 tablet (30 mg total) by mouth daily. 07/14/20  Yes Swayze, Ava, DO  pantoprazole (PROTONIX) 40 MG tablet TAKE ONE TABLET BY MOUTH TWICE A DAY 09/07/19  Yes Susy Frizzle, MD  potassium chloride 20 MEQ TBCR Take 10 mEq by mouth daily. 07/13/20  Yes Swayze, Ava, DO  sildenafil (VIAGRA) 100 MG tablet Take 0.5-1 tablets (50-100 mg total) by mouth daily as needed for erectile dysfunction. 05/09/20  Yes Susy Frizzle, MD  sitaGLIPtin (JANUVIA) 50 MG tablet Take 25 mg by mouth daily.   Yes [provider]  sodium bicarbonate 650 MG tablet Take 1,950 mg by mouth daily.    Yes [provider]  torsemide (DEMADEX) 20 MG tablet Take 3 tablets (60 mg total) by mouth daily. 07/13/20  Yes Swayze, Ava, DO  calcitRIOL (ROCALTROL) 0.5 MCG capsule Take 1 capsule (0.5 mcg total) by mouth every Monday, Wednesday, and Friday. 07/20/20   Barton Dubois, MD  magnesium oxide (MAG-OX) 400 MG tablet Take 0.5 tablets (200 mg total) by mouth daily. 07/19/20   Barton Dubois, MD    Scheduled Meds: . amiodarone  200 mg Oral  Daily  . aspirin EC  81 mg Oral Daily  . [START ON 07/20/2020] calcitRIOL  0.5 mcg Oral Q M,W,F  . cinacalcet  30 mg Oral Q breakfast  . doxazosin  8 mg Oral Daily  . heparin  5,000 Units Subcutaneous Q8H  . hydrALAZINE  75 mg Oral TID  . insulin aspart  0-5 Units Subcutaneous QHS  . insulin aspart  0-9 Units Subcutaneous TID WC  . isosorbide mononitrate  30 mg Oral Daily  . pantoprazole  40 mg Oral BID  . sodium bicarbonate  1,950 mg Oral Daily  . torsemide  60 mg Oral Daily   Continuous Infusions: PRN Meds:.ondansetron **OR** ondansetron (ZOFRAN) IV     Results for orders placed or performed during the hospital encounter of 07/18/20 (from the past 48 hour(s))  Comprehensive metabolic panel     Status: Abnormal   Collection Time: 07/18/20  3:10 PM  Result Value Ref Range   Sodium 136 135 - 145 mmol/L   Potassium 5.1 3.5 - 5.1 mmol/L   Chloride 97 (L) 98 - 111 mmol/L   CO2 27 22 - 32 mmol/L   Glucose, Bld 122 (H) 70 - 99 mg/dL    Comment: Glucose reference range applies only to samples taken after fasting for at least 8 hours.   BUN 96 (H) 8 - 23 mg/dL   Creatinine, Ser 4.17 (H) 0.61 - 1.24 mg/dL   Calcium 8.5 (L) 8.9 - 10.3 mg/dL   Total Protein 6.6 6.5 - 8.1 g/dL   Albumin 3.5 3.5 - 5.0 g/dL   AST 28 15 - 41 U/L   ALT 91 (H) 0 - 44 U/L   Alkaline Phosphatase 92 38 - 126 U/L   Total Bilirubin 1.5 (H) 0.3 - 1.2 mg/dL   GFR, Estimated 13 (L) >60 mL/min   Anion gap 12 5 - 15    Comment: Performed at [facility], [address], [place], [place] [address]  CBC     Status: Abnormal   Collection Time: 07/18/20  3:10 PM  Result Value Ref Range   WBC 8.2 4.0 - 10.5 K/uL  RBC 3.60 (L) 4.22 - 5.81 MIL/uL   Hemoglobin 11.3 (L) 13.0 - 17.0 g/dL   HCT 34.4 (L) 39 - 52 %   MCV 95.6 80.0 - 100.0 fL   MCH 31.4 26.0 - 34.0 pg   MCHC 32.8 30.0 - 36.0 g/dL   RDW 14.4 11.5 - 15.5 %   Platelets 178 150 - 400 K/uL   nRBC 0.0 0.0 - 0.2 %    Comment: Performed at [facility],  [address] [place], [place] [address]  Magnesium     Status: None   Collection Time: 07/18/20  3:10 PM  Result Value Ref Range   Magnesium 2.2 1.7 - 2.4 mg/dL    Comment: Performed at [facility], [address], [place], [place] [address]  Phosphorus     Status: Abnormal   Collection Time: 07/18/20  3:10 PM  Result Value Ref Range   Phosphorus 5.0 (H) 2.5 - 4.6 mg/dL    Comment: Performed at [facility], [address], [place], [place] [address]  I-stat chem 8, ED (not at [facility] or [facility])     Status: Abnormal   Collection Time: 07/18/20  3:14 PM  Result Value Ref Range   Sodium 137 135 - 145 mmol/L   Potassium 5.2 (H) 3.5 - 5.1 mmol/L   Chloride 97 (L) 98 - 111 mmol/L   BUN 96 (H) 8 - 23 mg/dL   Creatinine, Ser 4.50 (H) 0.61 - 1.24 mg/dL   Glucose, Bld 119 (H) 70 - 99 mg/dL    Comment: Glucose reference range applies only to samples taken after fasting for at least 8 hours.   Calcium, Ion 1.06 (L) 1.15 - 1.40 mmol/L   TCO2 31 22 - 32 mmol/L   Hemoglobin 12.2 (L) 13.0 - 17.0 g/dL   HCT 36.0 (L) 39 - 52 %  Glucose, capillary     Status: None   Collection Time: 07/18/20 11:02 PM  Result Value Ref Range   Glucose-Capillary 82 70 - 99 mg/dL    Comment: Glucose reference range applies only to samples taken after fasting for at least 8 hours.  Glucose, capillary     Status: Abnormal   Collection Time: 07/19/20  7:55 AM  Result Value Ref Range   Glucose-Capillary 106 (H) 70 - 99 mg/dL    Comment: Glucose reference range applies only to samples taken after fasting for at least 8 hours.  Glucose, capillary     Status: Abnormal   Collection Time: 07/19/20 11:13 AM  Result Value Ref Range   Glucose-Capillary 187 (H) 70 - 99 mg/dL    Comment: Glucose reference range applies only to samples taken after fasting for at least 8 hours.  Glucose, capillary     Status: None   Collection Time: 07/19/20  4:21 PM  Result Value Ref Range   Glucose-Capillary 86 70 - 99 mg/dL    Comment: Glucose  reference range applies only to samples taken after fasting for at least 8 hours.    Studies/Results: BRAIN MRI FINDINGS: Brain: Moderate atrophy unchanged. Negative for hydrocephalus. Periventricular deep white matter hyperintensities bilaterally unchanged  Negative for acute infarct, hemorrhage, mass  Vascular: Normal arterial flow voids  Skull and upper cervical spine: No focal skeletal lesion.  Sinuses/Orbits: Paranasal sinuses clear.  Negative orbit  Other: None  IMPRESSION: No acute abnormality  Moderate atrophy. Mild to moderate white matter changes consistent with chronic microvascular ischemia.      The brain MRI scan shows marked global  atrophy.  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  No acute changes are noted.  Thelmer Legler A. Merlene Laughter, M.D.  Diplomate, Tax adviser of Psychiatry and Neurology ( Neurology). 07/19/2020, 5:13 PM

## 2020-07-19 NOTE — Procedures (Signed)
  [place] A. Merlene Laughter, MD     [email or web address]           HISTORY: This is a 74 year old who presents with multiple movements suspicious for seizures.  MEDICATIONS: Current Outpatient Medications  Medication Instructions  . acetaminophen (TYLENOL) 1,000 mg, Oral, Every 6 hours PRN  . amiodarone (PACERONE) 200 mg, Oral, Daily  . aspirin EC 81 mg, Oral, Daily  . [START ON 07/20/2020] calcitRIOL (ROCALTROL) 0.5 mcg, Oral, Every M-W-F  . cinacalcet (SENSIPAR) 30 mg, Oral, Daily with breakfast  . citalopram (CELEXA) 10 MG tablet TAKE ONE (1) TABLET BY MOUTH EVERY DAY  . doxazosin (CARDURA) 8 mg, Oral, Daily  . hydrALAZINE (APRESOLINE) 75 mg, Oral, 3 times daily  . isosorbide mononitrate (IMDUR) 30 mg, Oral, Daily  . magnesium oxide (MAG-OX) 200 mg, Oral, Daily  . pantoprazole (PROTONIX) 40 MG tablet TAKE ONE TABLET BY MOUTH TWICE A DAY  . potassium chloride 20 MEQ TBCR 10 mEq, Oral, Daily  . sildenafil (VIAGRA) 50-100 mg, Oral, Daily PRN  . sitaGLIPtin (JANUVIA) 25 mg, Oral, Daily  . sodium bicarbonate 1,950 mg, Oral, Daily  . torsemide (DEMADEX) 60 mg, Oral, Daily       ANALYSIS: A 16 channel recording using standard 10 20 measurements is conducted for 25 minutes.  There is a well-formed posterior dominant rhythm of 8.5 to 9 Hz which attenuates with eye opening.  There is beta activity observed in frontal areas.  Mostly awake architecture is noted with some brief drowsiness at times.  Photic stimulation and hyperventilation were not carried out.  Focal or lateral slowing are not observed.  No epileptiform discharges were noted.   IMPRESSION: This is a normal recording of awake and drowsy states.      [name] A. Merlene Black, M.D.  Diplomate, Tax adviser of Psychiatry and Neurology ( Neurology).

## 2020-07-19 NOTE — Progress Notes (Signed)
Patient has been given discharge instructions and education.  Patient verbalizes understanding and has no questions at this time.

## 2020-07-19 NOTE — Evaluation (Signed)
Physical Therapy Evaluation Patient Details Name: Bradley Black MRN: [id number] DOB: [birth year] Today's Date: 07/19/2020   History of Present Illness  74 yo male with PMH including cardiomyopathy, CAD with CABG in 2013, CKD, DM2, HTN, frequent PVCs, and cardiac cath on 07/12/20. Pt presents to ED with complaint of uncontrollable tremors mostly in BLE, occasionally in UE and body.  Clinical Impression  Pt admitted with above diagnosis. Pt able to complete mobility with min G for safety. Pt with tremors noted in static sitting and after ambulation in static standing, but no tremors noted while ambulating. Pt reports tremors are random in frequency and uncontrollable. Discussed HHPT vs. OPPT pending progress with therapy and control of tremors for strengthening and balance and pt agreeable to think about it. Pt currently with functional limitations due to the deficits listed below (see PT Problem List). Pt will benefit from skilled PT to increase their independence and safety with mobility to allow discharge to the venue listed below.       Follow Up Recommendations Home health PT;Supervision - Intermittent (possibly no f/u pending progress)    Equipment Recommendations  Rolling walker with 5" wheels    Recommendations for Other Services       Precautions / Restrictions Precautions Precautions: None Restrictions Weight Bearing Restrictions: No      Mobility  Bed Mobility Overal bed mobility: Modified Independent  General bed mobility comments: slightly increased time to come to sitting EOB  Transfers Overall transfer level: Needs assistance Equipment used: None   Sit to Stand: Min guard  General transfer comment: min G for safety, slightly increased time  Ambulation/Gait Ambulation/Gait assistance: Min guard Gait Distance (Feet): 150 Feet Assistive device: Rolling walker (2 wheeled) Gait Pattern/deviations: Step-through pattern;Decreased stride length;Narrow base of  support Gait velocity: slightly decreased   General Gait Details: initial tremors noted that improved with distance, increased time with turns requiring repositioning self inside RW frame, no overt LOB  Stairs            Wheelchair Mobility    Modified Rankin (Stroke Patients Only)       Balance Overall balance assessment: Needs assistance Sitting-balance support: No upper extremity supported;Feet supported Sitting balance-Leahy Scale: Good Sitting balance - Comments: seated EOB   Standing balance support: During functional activity;Bilateral upper extremity supported Standing balance-Leahy Scale: Poor Standing balance comment: reliant on UE support with static/dynamic activity          Pertinent Vitals/Pain Pain Assessment: Faces Faces Pain Scale: Hurts a little bit Pain Location: bil calf when ambulating Pain Descriptors / Indicators: Aching;Sore Pain Intervention(s): Limited activity within patient's tolerance;Monitored during session    [place] expects to be discharged to:: Private residence Living Arrangements: Spouse/significant other Available Help at Discharge: Family;Available 24 hours/day Type of Home: House Home Access: Stairs to enter;Ramped entrance Entrance Stairs-Rails: Right;Left Entrance Stairs-Number of Steps: 5 Home Layout: One level Home Equipment: Cane - single point      Prior Function Level of Independence: Independent         Comments: Pt independent with communitu ambulation, ADLs, drives, completes yardwork.     Hand Dominance        Extremity/Trunk Assessment   Upper Extremity Assessment Upper Extremity Assessment: Overall WFL for tasks assessed    Lower Extremity Assessment Lower Extremity Assessment: Overall WFL for tasks assessed (symmetrical, strength 4+/5, AROM WNL, heel-to-shin test without deficit bil, denies numbness/tingling)    Cervical / Trunk Assessment Cervical / Trunk Assessment:  Normal  Communication   Communication: No difficulties  Cognition Arousal/Alertness: Awake/alert Behavior During Therapy: WFL for tasks assessed/performed Overall Cognitive Status: Within Functional Limits for tasks assessed                 General Comments      Exercises     Assessment/Plan    PT Assessment Patient needs continued PT services  PT Problem List Decreased strength;Decreased activity tolerance;Decreased balance;Impaired tone       PT Treatment Interventions DME instruction;Gait training;Stair training;Functional mobility training;Therapeutic activities;Therapeutic exercise;Balance training;Neuromuscular re-education;Patient/family education    PT Goals (Current goals can be found in the Care Plan section)  Acute Rehab PT Goals Patient Stated Goal: return home with spouse PT Goal Formulation: With patient Time For Goal Achievement: 07/26/20 Potential to Achieve Goals: Good    Frequency Min 3X/week   Barriers to discharge        Co-evaluation               AM-PAC PT "6 Clicks" Mobility  Outcome Measure Help needed turning from your back to your side while in a flat bed without using bedrails?: None Help needed moving from lying on your back to sitting on the side of a flat bed without using bedrails?: None Help needed moving to and from a bed to a chair (including a wheelchair)?: None Help needed standing up from a chair using your arms (e.g., wheelchair or bedside chair)?: None Help needed to walk in hospital room?: None Help needed climbing 3-5 steps with a railing? : A Little 6 Click Score: 23    End of Session Equipment Utilized During Treatment: Gait belt Activity Tolerance: Patient tolerated treatment well Patient left: in bed;with call bell/phone within reach;with family/visitor present Nurse Communication: Mobility status;Other (comment) (bil calf soreness with ambulation) PT Visit Diagnosis: Other abnormalities of gait and mobility  (R26.89)    Time: 9024-0973 PT Time Calculation (min) (ACUTE ONLY): 25 min   Charges:   PT Evaluation $PT Eval Low Complexity: 1 Low PT Treatments $Gait Training: 8-22 mins         Tori Aniqa Hare PT, DPT 07/19/20, 12:23 PM [phone]

## 2020-07-19 NOTE — Plan of Care (Signed)
  Problem: Acute Rehab PT Goals(only PT should resolve) Goal: Patient Will Transfer Sit To/From Stand Outcome: Progressing Flowsheets (Taken 07/19/2020 1225) Patient will transfer sit to/from stand: with modified independence Goal: Pt Will Ambulate Outcome: Progressing Flowsheets (Taken 07/19/2020 1225) Pt will Ambulate:  > 125 feet  with modified independence  with least restrictive assistive device   Tori Lavon Horn PT, DPT 07/19/20, 12:25 PM [phone]

## 2020-07-20 ENCOUNTER — Encounter (HOSPITAL_COMMUNITY): Payer: Self-pay

## 2020-07-20 ENCOUNTER — Other Ambulatory Visit: Payer: Self-pay

## 2020-07-20 ENCOUNTER — Inpatient Hospital Stay (HOSPITAL_COMMUNITY)
Admission: EM | Admit: 2020-07-20 | Discharge: 2020-08-02 | DRG: 264 | Disposition: A | Discharge status: Home Health | Payer: Medicare Other | Attending: Family Medicine | Admitting: Family Medicine

## 2020-07-20 DIAGNOSIS — N400 Enlarged prostate without lower urinary tract symptoms: Secondary | ICD-10-CM | POA: Diagnosis present

## 2020-07-20 DIAGNOSIS — M6281 Muscle weakness (generalized): Secondary | ICD-10-CM | POA: Diagnosis not present

## 2020-07-20 DIAGNOSIS — D696 Thrombocytopenia, unspecified: Secondary | ICD-10-CM | POA: Diagnosis not present

## 2020-07-20 DIAGNOSIS — Z8674 Personal history of sudden cardiac arrest: Secondary | ICD-10-CM

## 2020-07-20 DIAGNOSIS — N186 End stage renal disease: Secondary | ICD-10-CM | POA: Diagnosis present

## 2020-07-20 DIAGNOSIS — N179 Acute kidney failure, unspecified: Secondary | ICD-10-CM | POA: Diagnosis not present

## 2020-07-20 DIAGNOSIS — N19 Unspecified kidney failure: Secondary | ICD-10-CM

## 2020-07-20 DIAGNOSIS — Z20822 Contact with and (suspected) exposure to covid-19: Secondary | ICD-10-CM | POA: Diagnosis present

## 2020-07-20 DIAGNOSIS — R251 Tremor, unspecified: Secondary | ICD-10-CM

## 2020-07-20 DIAGNOSIS — I5042 Chronic combined systolic (congestive) and diastolic (congestive) heart failure: Secondary | ICD-10-CM | POA: Diagnosis not present

## 2020-07-20 DIAGNOSIS — Z01818 Encounter for other preprocedural examination: Secondary | ICD-10-CM | POA: Diagnosis not present

## 2020-07-20 DIAGNOSIS — G253 Myoclonus: Secondary | ICD-10-CM

## 2020-07-20 DIAGNOSIS — I5023 Acute on chronic systolic (congestive) heart failure: Secondary | ICD-10-CM | POA: Diagnosis not present

## 2020-07-20 DIAGNOSIS — I255 Ischemic cardiomyopathy: Secondary | ICD-10-CM | POA: Diagnosis present

## 2020-07-20 DIAGNOSIS — I5082 Biventricular heart failure: Secondary | ICD-10-CM | POA: Diagnosis present

## 2020-07-20 DIAGNOSIS — E860 Dehydration: Secondary | ICD-10-CM | POA: Diagnosis present

## 2020-07-20 DIAGNOSIS — I2510 Atherosclerotic heart disease of native coronary artery without angina pectoris: Secondary | ICD-10-CM | POA: Diagnosis not present

## 2020-07-20 DIAGNOSIS — E11319 Type 2 diabetes mellitus with unspecified diabetic retinopathy without macular edema: Secondary | ICD-10-CM | POA: Diagnosis present

## 2020-07-20 DIAGNOSIS — Z992 Dependence on renal dialysis: Secondary | ICD-10-CM

## 2020-07-20 DIAGNOSIS — Z981 Arthrodesis status: Secondary | ICD-10-CM

## 2020-07-20 DIAGNOSIS — R809 Proteinuria, unspecified: Secondary | ICD-10-CM | POA: Diagnosis not present

## 2020-07-20 DIAGNOSIS — N185 Chronic kidney disease, stage 5: Secondary | ICD-10-CM | POA: Diagnosis not present

## 2020-07-20 DIAGNOSIS — I878 Other specified disorders of veins: Secondary | ICD-10-CM | POA: Diagnosis not present

## 2020-07-20 DIAGNOSIS — D631 Anemia in chronic kidney disease: Secondary | ICD-10-CM | POA: Diagnosis not present

## 2020-07-20 DIAGNOSIS — I10 Essential (primary) hypertension: Secondary | ICD-10-CM | POA: Diagnosis not present

## 2020-07-20 DIAGNOSIS — R5381 Other malaise: Secondary | ICD-10-CM | POA: Diagnosis not present

## 2020-07-20 DIAGNOSIS — R9431 Abnormal electrocardiogram [ECG] [EKG]: Secondary | ICD-10-CM | POA: Diagnosis not present

## 2020-07-20 DIAGNOSIS — R17 Unspecified jaundice: Secondary | ICD-10-CM | POA: Diagnosis not present

## 2020-07-20 DIAGNOSIS — I120 Hypertensive chronic kidney disease with stage 5 chronic kidney disease or end stage renal disease: Secondary | ICD-10-CM | POA: Diagnosis not present

## 2020-07-20 DIAGNOSIS — Z743 Need for continuous supervision: Secondary | ICD-10-CM | POA: Diagnosis not present

## 2020-07-20 DIAGNOSIS — I132 Hypertensive heart and chronic kidney disease with heart failure and with stage 5 chronic kidney disease, or end stage renal disease: Principal | ICD-10-CM | POA: Diagnosis present

## 2020-07-20 DIAGNOSIS — E440 Moderate protein-calorie malnutrition: Secondary | ICD-10-CM | POA: Diagnosis not present

## 2020-07-20 DIAGNOSIS — E875 Hyperkalemia: Secondary | ICD-10-CM | POA: Diagnosis present

## 2020-07-20 DIAGNOSIS — Z452 Encounter for adjustment and management of vascular access device: Secondary | ICD-10-CM | POA: Diagnosis not present

## 2020-07-20 DIAGNOSIS — E8889 Other specified metabolic disorders: Secondary | ICD-10-CM | POA: Diagnosis present

## 2020-07-20 DIAGNOSIS — Z7982 Long term (current) use of aspirin: Secondary | ICD-10-CM

## 2020-07-20 DIAGNOSIS — Z951 Presence of aortocoronary bypass graft: Secondary | ICD-10-CM

## 2020-07-20 DIAGNOSIS — E782 Mixed hyperlipidemia: Secondary | ICD-10-CM | POA: Diagnosis not present

## 2020-07-20 DIAGNOSIS — N189 Chronic kidney disease, unspecified: Secondary | ICD-10-CM | POA: Diagnosis not present

## 2020-07-20 DIAGNOSIS — G8929 Other chronic pain: Secondary | ICD-10-CM | POA: Diagnosis not present

## 2020-07-20 DIAGNOSIS — Z6820 Body mass index (BMI) 20.0-20.9, adult: Secondary | ICD-10-CM

## 2020-07-20 DIAGNOSIS — R531 Weakness: Secondary | ICD-10-CM

## 2020-07-20 DIAGNOSIS — E211 Secondary hyperparathyroidism, not elsewhere classified: Secondary | ICD-10-CM | POA: Diagnosis not present

## 2020-07-20 DIAGNOSIS — I252 Old myocardial infarction: Secondary | ICD-10-CM | POA: Diagnosis not present

## 2020-07-20 DIAGNOSIS — E1140 Type 2 diabetes mellitus with diabetic neuropathy, unspecified: Secondary | ICD-10-CM | POA: Diagnosis present

## 2020-07-20 DIAGNOSIS — K739 Chronic hepatitis, unspecified: Secondary | ICD-10-CM | POA: Diagnosis not present

## 2020-07-20 DIAGNOSIS — Z955 Presence of coronary angioplasty implant and graft: Secondary | ICD-10-CM

## 2020-07-20 DIAGNOSIS — K219 Gastro-esophageal reflux disease without esophagitis: Secondary | ICD-10-CM | POA: Diagnosis not present

## 2020-07-20 DIAGNOSIS — R0902 Hypoxemia: Secondary | ICD-10-CM | POA: Diagnosis not present

## 2020-07-20 DIAGNOSIS — Z7984 Long term (current) use of oral hypoglycemic drugs: Secondary | ICD-10-CM

## 2020-07-20 DIAGNOSIS — R509 Fever, unspecified: Secondary | ICD-10-CM | POA: Diagnosis not present

## 2020-07-20 DIAGNOSIS — E1122 Type 2 diabetes mellitus with diabetic chronic kidney disease: Secondary | ICD-10-CM | POA: Diagnosis not present

## 2020-07-20 DIAGNOSIS — E871 Hypo-osmolality and hyponatremia: Secondary | ICD-10-CM | POA: Diagnosis not present

## 2020-07-20 DIAGNOSIS — Z87891 Personal history of nicotine dependence: Secondary | ICD-10-CM

## 2020-07-20 DIAGNOSIS — I493 Ventricular premature depolarization: Secondary | ICD-10-CM | POA: Diagnosis present

## 2020-07-20 DIAGNOSIS — Z79899 Other long term (current) drug therapy: Secondary | ICD-10-CM

## 2020-07-20 DIAGNOSIS — N170 Acute kidney failure with tubular necrosis: Secondary | ICD-10-CM | POA: Diagnosis not present

## 2020-07-20 LAB — RESPIRATORY PANEL BY RT PCR (FLU A&B, COVID)
Influenza A by PCR: NEGATIVE
Influenza B by PCR: NEGATIVE
SARS Coronavirus 2 by RT PCR: NEGATIVE

## 2020-07-20 LAB — COMPREHENSIVE METABOLIC PANEL
ALT: 67 U/L — ABNORMAL HIGH (ref 0–44)
AST: 26 U/L (ref 15–41)
Albumin: 3.8 g/dL (ref 3.5–5.0)
Alkaline Phosphatase: 102 U/L (ref 38–126)
Anion gap: 12 (ref 5–15)
BUN: 100 mg/dL — ABNORMAL HIGH (ref 8–23)
CO2: 25 mmol/L (ref 22–32)
Calcium: 8.5 mg/dL — ABNORMAL LOW (ref 8.9–10.3)
Chloride: 97 mmol/L — ABNORMAL LOW (ref 98–111)
Creatinine, Ser: 5.13 mg/dL — ABNORMAL HIGH (ref 0.61–1.24)
GFR, Estimated: 10 mL/min — ABNORMAL LOW (ref >60)
Glucose, Bld: 109 mg/dL — ABNORMAL HIGH (ref 70–99)
Potassium: 5.2 mmol/L — ABNORMAL HIGH (ref 3.5–5.1)
Sodium: 134 mmol/L — ABNORMAL LOW (ref 135–145)
Total Bilirubin: 1.7 mg/dL — ABNORMAL HIGH (ref 0.3–1.2)
Total Protein: 7.1 g/dL (ref 6.5–8.1)

## 2020-07-20 LAB — CBC WITH DIFFERENTIAL/PLATELET
Abs Immature Granulocytes: 0.03 10*3/uL (ref 0.00–0.07)
Basophils Absolute: 0 10*3/uL (ref 0.0–0.1)
Basophils Relative: 0 %
Eosinophils Absolute: 0.1 10*3/uL (ref 0.0–0.5)
Eosinophils Relative: 1 %
HCT: 36.1 % — ABNORMAL LOW (ref 39.0–52.0)
Hemoglobin: 12 g/dL — ABNORMAL LOW (ref 13.0–17.0)
Immature Granulocytes: 0 %
Lymphocytes Relative: 9 %
Lymphs Abs: 0.8 10*3/uL (ref 0.7–4.0)
MCH: 31.5 pg (ref 26.0–34.0)
MCHC: 33.2 g/dL (ref 30.0–36.0)
MCV: 94.8 fL (ref 80.0–100.0)
Monocytes Absolute: 0.8 10*3/uL (ref 0.1–1.0)
Monocytes Relative: 9 %
Neutro Abs: 7.1 10*3/uL (ref 1.7–7.7)
Neutrophils Relative %: 81 %
Platelets: 200 10*3/uL (ref 150–400)
RBC: 3.81 MIL/uL — ABNORMAL LOW (ref 4.22–5.81)
RDW: 14.2 % (ref 11.5–15.5)
WBC: 9 10*3/uL (ref 4.0–10.5)
nRBC: 0 % (ref 0.0–0.2)

## 2020-07-20 MED ORDER — LORAZEPAM 2 MG/ML IJ SOLN
1.0000 mg | Freq: Once | INTRAMUSCULAR | Status: AC
  Administered 2020-07-20: 1 mg via INTRAVENOUS
  Filled 2020-07-20: qty 1

## 2020-07-20 NOTE — H&P (Signed)
History and Physical  Andersson BOYCE KELTNER IDP:[id number] DOB: [birth year] DOA: 07/20/2020  Referring physician: Milton Ferguson, MD PCP: Susy Frizzle, MD  Outpatient Specialists: Thompson Grayer, MD (electrophysiology) Patient coming from: Home  Chief Complaint: Involuntary shaking of extremities  HPI: Ruthvik L Ofarrell is a 74 y.o. male with medical history significant for  ischemic cardiomyopathy-systolic and diastolic CHF, hypertension, diabetes mellitus and CKD 5 who presents to the emergency department due to recurrent and ongoing uncontrollable tremors that has been ongoing since being discharged yesterday (10/19).  Patient was admitted on 10/18 due to uncontrollable tremors/myoclonic jerks that started on 10/17 and he was referred to the ED by patient's cardiologist due to concern for worsening kidney function per medical record. Patient was unable to provide history, history was provided by wife at bedside, per wife, on returning home yesterday (s/p discharge), patient continued to have some shaking and jerking movements in hands and legs, patient did not get enough sleep last night due to the jerking movements, he was supposed to follow-up with his nephrologist this afternoon at 3 PM, but due to weakness and continued shaking and jerking movements, wife called nephrologist office who spoke with wife by FaceTime, and on seeing patient via FaceTime with the mentioned jerking movements, he asked patient's wife to activate 911 and that patient will require inpatient nephrologist. Patient was taken back to the ED by EMS team for further evaluation. Prior to being discharged yesterday (10/19), patient's electrolytes were repleted, creatinine was 4.5 on discharge (patient's baseline creatinine 3.8-4.6), he was continued on his current dose of diuretics (60 mg of torsemide daily) and was discharged home with home health PT and use of a rolling walker.  He was seen by neurologist (Dr. Merlene Laughter) and diagnosed to  have continuous myoclonus and negative myoclonus which was suspected to be due to toxic metabolic derangements secondary to medication effect (SSRI citalopram) and renal failure, EEG done showed a normal recording of awake and drowsy states.  MRI of brain done also showed no acute abnormality.   ED Course:  In the emergency department, he was hemodynamically stable, work-up in the ED showed sodium 134, potassium 5.2, BUN/creatinine 100/5.13 (creatinine was 4.5 on discharge), blood glucose 109, total bilirubin 1.7.  Calcium 8.5, CBC showed normocytic anemia.  Respiratory panel by RT-PCR for influenza A and B was negative and SARS coronavirus 2 was negative, total bilirubin 1.7.  IV Ativan 1 mg was given with control of the tremors.  Hospitalist was asked to admit patient for further evaluation management. At bedside, patient was sleepy (possibly due to IV Ativan given), but he was not in any acute distress.  Review of Systems: Constitutional: Negative for chills and fever.  HENT: Negative for ear pain and sore throat.   Eyes: Negative for pain and visual disturbance.  Respiratory: Negative for cough, chest tightness and shortness of breath.   Cardiovascular: Negative for chest pain and palpitations.  Gastrointestinal: Negative for abdominal pain and vomiting.  Endocrine: Negative for polyphagia and polyuria.  Genitourinary: Negative for decreased urine volume, dysuria Musculoskeletal: Negative for arthralgias and back pain.  Skin: Negative for color change and rash.  Allergic/Immunologic: Negative for immunocompromised state.  Neurological: Positive for tremors,  weakness.  Negative for syncope, speech difficulty Hematological: Does not bruise/bleed easily.  All other systems reviewed and are negative   Past Medical History:  Diagnosis Date  . BPH (benign prostatic hyperplasia)   . CAD S/P percutaneous coronary angioplasty    a. PTCA of OM  [place] b. PCI with BMS to LAD in 1997 c. RCA  PCI [place] d. s/p CABG in 11/2011 with LIMA-LAD, SVG-PDA, SVG-OM2, and SVG-D1  . Chronic back pain   . CKD (chronic kidney disease), stage III ([place])   . Cyst of bursa    R shoulder  . Diabetic retinopathy ([place])   . DM (diabetes mellitus), type 2 with renal complications ([place])   . Essential hypertension   . Frequent PVCs   . GERD (gastroesophageal reflux disease)   . Ischemic cardiomyopathy 11/2011   Intra-OP TEE: EF 40-45%, no regional WMA; improved Anterior WM post CABG.  . Left carotid artery stenosis   . Mixed hyperlipidemia   . 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 BACK SURGERY  1970  . CARDIAC CATHETERIZATION  2013  . CORONARY ANGIOPLASTY  1994   OM  . CORONARY ANGIOPLASTY  1997   LAD  . CORONARY ANGIOPLASTY WITH STENT PLACEMENT  1999   RCA  . CORONARY ANGIOPLASTY WITH STENT PLACEMENT  2001   RCA  . CORONARY ARTERY BYPASS GRAFT  12/27/2011   Procedure: CORONARY ARTERY BYPASS GRAFTING (CABG);  Surgeon: Rexene Alberts, MD;  Location: [facility];  Service: Open Heart Surgery;  Laterality: N/A;  Times four. On pump. Using endoscopically harvested right greater saphenous vein and left internal mammary artery.   [name] HERNIA REPAIR    . INCISION AND DRAINAGE OF WOUND  2006   [place]  . INTRAOPERATIVE TRANSESOPHAGEAL ECHOCARDIOGRAM  12/27/2011   Global hypokinesis with EF of 40-45%, improved LAD distribution wall motion.  [name] LEFT HEART CATHETERIZATION WITH CORONARY ANGIOGRAM N/A 12/13/2011   Procedure: LEFT HEART CATHETERIZATION WITH CORONARY ANGIOGRAM;  Surgeon: Leonie Man, MD;  Location: [facility] CATH LAB;  Service: Cardiovascular;  Laterality: N/A;  . LUMBAR FUSION    . MASS EXCISION  10/24/11   R arm  . RIGHT HEART CATH N/A 07/12/2020   Procedure: RIGHT HEART CATH;  Surgeon: Belva Crome, MD;  Location: [place] CV LAB;  Service: Cardiovascular;  Laterality:  N/A;  . TRANSESOPHAGEAL ECHOCARDIOGRAM  2013    Social History:  reports that he quit smoking about 33 years ago. His smoking use included cigarettes. He has never used smokeless tobacco. He reports that he does not drink alcohol and does not use drugs.   No Known Allergies  Family History  Problem Relation Age of Onset  . Cancer Mother        breast  . Cancer Father        bone  . [place] cancer Neg Hx     Prior to Admission medications   Medication Sig Start Date End Date Taking? Authorizing Provider  acetaminophen (TYLENOL) 500 MG tablet Take 1,000 mg by mouth every 6 (six) hours as needed for moderate pain or headache.    [provider]  amiodarone (PACERONE) 200 MG tablet Take 1 tablet (200 mg total) by mouth daily. 07/18/20   Allred, Jeneen Rinks, MD  aspirin EC 81 MG tablet Take 81 mg by mouth daily.    [provider]  calcitRIOL (ROCALTROL) 0.5 MCG capsule Take 1 capsule (0.5 mcg total) by mouth every Monday, Wednesday, and Friday. 07/20/20   Barton Dubois, MD  cinacalcet (SENSIPAR) 30 MG tablet Take 30 mg by mouth daily with breakfast.  03/23/19   [provider]  doxazosin (CARDURA) 8 MG tablet Take 1 tablet (8 mg total) by mouth daily. 02/17/20   Susy Frizzle, MD  hydrALAZINE (APRESOLINE) 50 MG tablet Take 1.5 tablets (75 mg total) by mouth 3 (three) times daily. 07/13/20   Swayze, Ava, DO  isosorbide mononitrate (IMDUR) 30 MG 24 hr tablet Take 1 tablet (30 mg total) by mouth daily. 07/14/20   Swayze, Ava, DO  magnesium oxide (MAG-OX) 400 MG tablet Take 0.5 tablets (200 mg total) by mouth daily. 07/19/20   Barton Dubois, MD  pantoprazole (PROTONIX) 40 MG tablet TAKE ONE TABLET BY MOUTH TWICE A DAY 09/07/19   Susy Frizzle, MD  sildenafil (VIAGRA) 100 MG tablet Take 0.5-1 tablets (50-100 mg total) by mouth daily as needed for erectile dysfunction. 05/09/20   Susy Frizzle, MD  sitaGLIPtin (JANUVIA) 50 MG tablet Take 25 mg by mouth daily.     [provider]  sodium bicarbonate 650 MG tablet Take 1,950 mg by mouth daily.     [provider]  torsemide (DEMADEX) 20 MG tablet Take 3 tablets (60 mg total) by mouth daily. 07/13/20   Swayze, Ava, DO    Physical Exam: BP (!) 169/61   Pulse 61   Temp 98 F (36.7 C) (Oral)   Resp 18   Ht 5\' 8"  (1.727 m)   Wt 63.5 kg   SpO2 94%   BMI 21.29 kg/m   . General: 74 y.o. year-old male well developed well nourished in no acute distress.  Alert and oriented x3. [name] HEENT: Dry mucous membrane.  NCAT, EOMI . Neck: Supple, trachea medial . Cardiovascular: Regular rate and rhythm with no rubs or gallops.  No thyromegaly or JVD noted.  No lower extremity edema. 2/4 pulses in all 4 extremities. [name] Respiratory: Clear to auscultation with no wheezes or rales. Good inspiratory effort. . Abdomen: Soft nontender nondistended with normal bowel sounds x4 quadrants. . Muskuloskeletal: No cyanosis, clubbing or edema noted bilaterally . Neuro: CN II-XII intact, strength, sensation, reflexes intact.  No involuntary jerking movements of extremities noted on exam (patient is status post IV Ativan). . Skin: No ulcerative lesions noted or rashes . Psychiatry: Judgement and insight appear normal. Mood is appropriate for condition and setting          Labs on Admission:  Basic Metabolic Panel: Recent Labs  Lab 07/18/20 1510 07/18/20 1514 07/20/20 1915  NA 136 137 134*  K 5.1 5.2* 5.2*  CL 97* 97* 97*  CO2 27  --  25  GLUCOSE 122* 119* 109*  BUN 96* 96* 100*  CREATININE 4.17* 4.50* 5.13*  CALCIUM 8.5*  --  8.5*  MG 2.2  --   --   PHOS 5.0*  --   --    Liver Function Tests: Recent Labs  Lab 07/18/20 1510 07/20/20 1915  AST 28 26  ALT 91* 67*  ALKPHOS 92 102  BILITOT 1.5* 1.7*  PROT 6.6 7.1  ALBUMIN 3.5 3.8   No results for input(s): LIPASE, AMYLASE in the last 168 hours. No results for input(s): AMMONIA in the last 168 hours. CBC: Recent Labs  Lab 07/18/20 1510  07/18/20 1514 07/20/20 1915  WBC 8.2  --  9.0  NEUTROABS  --   --  7.1  HGB 11.3* 12.2* 12.0*  HCT 34.4* 36.0* 36.1*  MCV 95.6  --  94.8  PLT 178  --  200   Cardiac Enzymes: No results for input(s):  CKTOTAL, CKMB, CKMBINDEX, TROPONINI in the last 168 hours.  BNP (last 3 results) Recent Labs    07/08/20 1329  BNP >4,500.0*    ProBNP (last 3 results) No results for input(s): PROBNP in the last 8760 hours.  CBG: Recent Labs  Lab 07/18/20 2302 07/19/20 0755 07/19/20 1113 07/19/20 1621 07/21/20 0113  GLUCAP 82 106* 187* 86 91    Radiological Exams on Admission: EEG adult  Result Date: 07/19/2020 Phillips Odor, MD     07/19/2020  6:03 PM Melville A. Merlene Laughter, MD     [email or web address]       HISTORY: This is a 74 year old who presents with multiple movements suspicious for seizures. MEDICATIONS: Current Outpatient Medications Medication Instructions . acetaminophen (TYLENOL) 1,000 mg, Oral, Every 6 hours PRN . amiodarone (PACERONE) 200 mg, Oral, Daily . aspirin EC 81 mg, Oral, Daily . [START ON 07/20/2020] calcitRIOL (ROCALTROL) 0.5 mcg, Oral, Every M-W-F . cinacalcet (SENSIPAR) 30 mg, Oral, Daily with breakfast . citalopram (CELEXA) 10 MG tablet TAKE ONE (1) TABLET BY MOUTH EVERY DAY . doxazosin (CARDURA) 8 mg, Oral, Daily . hydrALAZINE (APRESOLINE) 75 mg, Oral, 3 times daily . isosorbide mononitrate (IMDUR) 30 mg, Oral, Daily . magnesium oxide (MAG-OX) 200 mg, Oral, Daily . pantoprazole (PROTONIX) 40 MG tablet TAKE ONE TABLET BY MOUTH TWICE A DAY . potassium chloride 20 MEQ TBCR 10 mEq, Oral, Daily . sildenafil (VIAGRA) 50-100 mg, Oral, Daily PRN . sitaGLIPtin (JANUVIA) 25 mg, Oral, Daily . sodium bicarbonate 1,950 mg, Oral, Daily . torsemide (DEMADEX) 60 mg, Oral, Daily ANALYSIS: A 16 channel recording using standard 10 20 measurements is conducted for 25 minutes.  There is a well-formed posterior dominant rhythm of 8.5 to 9 Hz which attenuates with eye  opening.  There is beta activity observed in frontal areas.  Mostly awake architecture is noted with some brief drowsiness at times.  Photic stimulation and hyperventilation were not carried out.  Focal or lateral slowing are not observed.  No epileptiform discharges were noted. IMPRESSION: This is a normal recording of awake and drowsy states. Kofi A. Merlene Laughter, M.D. Diplomate, Tax adviser of Psychiatry and Neurology ( Neurology).    EKG: I independently viewed the EKG done and my findings are as followed: Sinus bradycardia at a rate of 58 bpm with consideration for LVH and prolonged QTc (525ms)  Assessment/Plan Present on Admission: . Tremors of nervous system . Ischemic cardiomyopathy . Essential hypertension . Type 2 diabetes mellitus with diabetic chronic kidney disease ([place]) . GERD (gastroesophageal reflux disease) . Hypocalcemia . 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 . Myoclonic jerking . Hyperkalemia  Principal Problem:   Tremors of nervous system Active Problems:   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   Ischemic cardiomyopathy   Essential hypertension   Type 2 diabetes mellitus with diabetic chronic kidney disease ([facility])   Hyperkalemia   GERD (gastroesophageal reflux disease)   Myoclonic jerking   Hypocalcemia   CKD (chronic kidney disease), stage V ([facility])   Dehydration   Hyponatremia   Prolonged QT interval   Myoclonic jerks/tremors Generalized weakness in the setting of above Patient was discharged yesterday (10/19) due to similar problems Compressive inpatient work-up ruled out neurological cause for patient's symptoms and was suspected to be due to toxic metabolic encephalopathy (Celexa has been discontinued), however, patient's kidney status has worsened with creatinine 5.13 (this was 4.5 on discharge) Torsemide will be temporarily held at this time; patient still produces urine Continue telemetry  Nephrology will be consulted regarding electrolyte  replenishment and to evaluate for possible dialysis Gentle IV hydration of 500 mL of NS will be given  Continue fall precaution and neurochecks Consider reconsulting of neurology if still neurologically indicated despite nephrology input and recommendation  Chronic kidney disease stage V/dehydration BUN to creatinine 100/5.13 (patient's baseline creatinine 3.8-4.6); creatinine was 4.5 on discharge yesterday (10/9) Continue gentle hydration as described above Renally adjust medications, avoid nephrotoxic agents/dehydration/hypotension  Hypocalcemia, hyponatremia, hyperkalemia Calcium 8.5; corrected calcium based on albumin level = 8.7 Sodium 134 K+ 5.2  Continue gentle IV hydration as per above Continue cinacalcet and calcitriol per home regimen Continue to monitor electrolytes with morning labs  Elevated total bilirubin Total bilirubin 1.7 (this was 1.5 on 10/18, 1.2 on 10/13).  Direct bilirubin will be checked  Prolonged QTc(598ms) Avoid QT prolonging drugs Magnesium level will be checked Repeat EKG in the morning  Type 2 diabetes mellitus with nephropathy Most recent A1c= 5.7 (07/08/2020) Continue insulin sliding scale and hypoglycemia protocol Home Januvia will be held at this time  GERD  Continue Protonix  Essential hypertension Continue hydralazine, Imdur, Cardura  Chronic systolic and diastolic heart failure Last echo 07/09/2020 EF 25%, with grade 3 diastolic dysfunction restrictive Torsemide temporarily held tonight due to dehydration and worsening kidney function  History of CABG x4 Stable , patient denies chest pain EKG pending Continue aspirin, Imdur per home regimen  History of PVCs/NSVT Continue amiodarone  Echocardiogram done on 07/09/2020 IMPRESSIONS   1. Compared to previous echo, LVEF and RVEF are worse . Left ventricular  ejection fraction, by estimation, is 25%%. The left ventricle has severely  decreased function. The left ventricle demonstrates  global hypokinesis.  The left ventricular internal  cavity size was moderately dilated. Left ventricular diastolic parameters  are consistent with Grade III diastolic dysfunction (restrictive).  Elevated left atrial pressure.  2. Right ventricular systolic function is severely reduced. The right  ventricular size is mildly enlarged. There is moderately elevated  pulmonary artery systolic pressure.  3. Left atrial size was severely dilated.  4. Right atrial size was moderately dilated.  5. The mitral valve is normal in structure. Mild to moderate mitral valve  regurgitation.  6. The aortic valve is tricuspid. Aortic valve regurgitation is trivial.  7. The inferior vena cava is dilated in size with <50% respiratory  variability, suggesting right atrial pressure of 15 mmHg.    DVT prophylaxis: Heparin subcu  Code Status: Full code  Family Communication: Wife at bedside (all questions answered to satisfaction)  Disposition Plan:  Patient is from:                        home Anticipated DC to:                   SNF or family members home Anticipated DC date:               2-3 days Anticipated DC barriers:          Patient is unstable to be discharged home at this time due to continued myoclonic jerks, worsening kidney function requiring nephrology consult and further recommendation.   Consults called: Nephrology  Admission status: Inpatient    Bernadette Hoit MD [facility]  07/21/2020, 1:31 AM

## 2020-07-20 NOTE — ED Triage Notes (Signed)
BS 149

## 2020-07-20 NOTE — ED Provider Notes (Signed)
[facility] EMERGENCY DEPARTMENT Provider Note   CSN: [id number] Arrival date & time: 07/20/20  1716     History Chief Complaint  Patient presents with  . Shaking    Patient here with complaints of upper and lower involuntary movements that started two days ago. Seen in ED at that time for same and states that he was discharged home with RX for treatment but has been unable to get it filled.     Bryor L Cordner is a 74 y.o. male.  Patient has been having shaking motions and was admitted to the hospital last week.  He was seen by neurology and it was thought it was more of a metabolic imbalance.  The patient has significant renal failure.  He was discharged home yesterday and then saw his nephrologist today who feels like the patient needs to be readmitted and probably needs dialysis to fix this problem.  The history is provided by the patient (Patient's nephrologist). No language interpreter was used.  Weakness Severity:  Moderate Onset quality:  Sudden Timing:  Constant Progression:  Worsening Chronicity:  Recurrent Context: not alcohol use   Relieved by:  Nothing Worsened by:  Nothing Ineffective treatments:  None tried Associated symptoms: no abdominal pain, no chest pain, no cough, no diarrhea, no frequency, no headaches and no seizures   Risk factors: no anemia        Past Medical History:  Diagnosis Date  . BPH (benign prostatic hyperplasia)   . CAD S/P percutaneous coronary angioplasty    a. PTCA of [place] b. PCI with BMS to LAD in 1997 c. RCA PCI [place] d. s/p CABG in 11/2011 with LIMA-LAD, SVG-PDA, SVG-OM2, and SVG-D1  . Chronic back pain   . CKD (chronic kidney disease), stage III ([place])   . Cyst of bursa    R shoulder  . Diabetic retinopathy ([place])   . DM (diabetes mellitus), type 2 with renal complications ([place])   . Essential hypertension   . Frequent PVCs   . GERD (gastroesophageal reflux disease)   . Ischemic cardiomyopathy 11/2011    Intra-OP TEE: EF 40-45%, no regional WMA; improved Anterior WM post CABG.  . Left carotid artery stenosis   . Mixed hyperlipidemia   . 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 Tremors of nervous system 07/20/2020  . Hypocalcemia   . Myoclonic jerking 07/18/2020  . 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 Frequent PVCs   . Acute renal failure superimposed on stage 4 chronic kidney disease ([place])   . SOB (shortness of breath) 07/08/2020  . Elevated LFTs 07/08/2020  . Left carotid artery stenosis   . IDA (iron deficiency anemia) 04/13/2020  . GERD (gastroesophageal reflux disease)   . Essential hypertension 07/24/2019  . Anemia in chronic kidney disease 07/24/2019  . Chronic kidney disease, stage IV (severe) ([place]) 07/24/2019  . Hyperkalemia 07/24/2019  . 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 Type 2 diabetes mellitus with diabetic chronic kidney disease ([place]) 07/19/2016  . Cervicalgia 03/08/2014  . Whiplash injury to neck 03/08/2014  . Aortic heart murmur 10/18/2013  . Acute myocardial infarction, History of:   [name] Hyperlipidemia LDL goal <70   . Anemia 04/23/2013  . 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 CAD in  native artery - status post CABG x4 11/30/2011  . Ischemic cardiomyopathy 11/30/2011  . Actinic keratosis, left scalp 10/04/2011  . Seborrheic keratosis, right lateral leg 10/04/2011    Past Surgical History:  Procedure Laterality Date  . BACK SURGERY  1970  . CARDIAC CATHETERIZATION  2013  . CORONARY ANGIOPLASTY  1994   OM  . CORONARY ANGIOPLASTY  1997   LAD  . CORONARY ANGIOPLASTY WITH STENT PLACEMENT  1999   RCA  . CORONARY ANGIOPLASTY WITH STENT PLACEMENT  2001   RCA  . CORONARY ARTERY BYPASS GRAFT  12/27/2011   Procedure: CORONARY ARTERY BYPASS GRAFTING (CABG);  Surgeon: Rexene Alberts, MD;  Location: [facility];  Service: Open Heart  Surgery;  Laterality: N/A;  Times four. On pump. Using endoscopically harvested right greater saphenous vein and left internal mammary artery.   [name] HERNIA REPAIR    . INCISION AND DRAINAGE OF WOUND  2006   [place]  . INTRAOPERATIVE TRANSESOPHAGEAL ECHOCARDIOGRAM  12/27/2011   Global hypokinesis with EF of 40-45%, improved LAD distribution wall motion.  [name] LEFT HEART CATHETERIZATION WITH CORONARY ANGIOGRAM N/A 12/13/2011   Procedure: LEFT HEART CATHETERIZATION WITH CORONARY ANGIOGRAM;  Surgeon: Leonie Man, MD;  Location: [facility] CATH LAB;  Service: Cardiovascular;  Laterality: N/A;  . LUMBAR FUSION    . MASS EXCISION  10/24/11   R arm  . RIGHT HEART CATH N/A 07/12/2020   Procedure: RIGHT HEART CATH;  Surgeon: Belva Crome, MD;  Location: [facility] CV LAB;  Service: Cardiovascular;  Laterality: N/A;  . TRANSESOPHAGEAL ECHOCARDIOGRAM  2013       Family History  Problem Relation Age of Onset  . Cancer Mother        breast  . Cancer Father        bone  . [place] cancer Neg Hx     Social History   Tobacco Use  . Smoking status: Former Smoker    Types: Cigarettes    Quit date: 10/01/1986    Years since quitting: 33.8  . Smokeless tobacco: Never Used  . Tobacco comment: quit about 30 yrs ago  Vaping Use  . Vaping Use: Never used  Substance Use Topics  . Alcohol use: No  . Drug use: No    Home Medications Prior to Admission medications   Medication Sig Start Date End Date Taking? Authorizing Provider  acetaminophen (TYLENOL) 500 MG tablet Take 1,000 mg by mouth every 6 (six) hours as needed for moderate pain or headache.    [provider]  amiodarone (PACERONE) 200 MG tablet Take 1 tablet (200 mg total) by mouth daily. 07/18/20   Allred, Jeneen Rinks, MD  aspirin EC 81 MG tablet Take 81 mg by mouth daily.    [provider]  calcitRIOL (ROCALTROL) 0.5 MCG capsule Take 1 capsule (0.5 mcg total) by mouth every Monday, Wednesday, and Friday. 07/20/20   Barton Dubois, MD    cinacalcet (SENSIPAR) 30 MG tablet Take 30 mg by mouth daily with breakfast.  03/23/19   [provider]  doxazosin (CARDURA) 8 MG tablet Take 1 tablet (8 mg total) by mouth daily. 02/17/20   Susy Frizzle, MD  hydrALAZINE (APRESOLINE) 50 MG tablet Take 1.5 tablets (75 mg total) by mouth 3 (three) times daily. 07/13/20   Swayze, Ava, DO  isosorbide mononitrate (IMDUR) 30 MG 24 hr tablet Take 1 tablet (30 mg total) by mouth daily. 07/14/20   Swayze, Ava, DO  magnesium oxide (MAG-OX) 400 MG tablet  Take 0.5 tablets (200 mg total) by mouth daily. 07/19/20   Barton Dubois, MD  pantoprazole (PROTONIX) 40 MG tablet TAKE ONE TABLET BY MOUTH TWICE A DAY 09/07/19   Susy Frizzle, MD  sildenafil (VIAGRA) 100 MG tablet Take 0.5-1 tablets (50-100 mg total) by mouth daily as needed for erectile dysfunction. 05/09/20   Susy Frizzle, MD  sitaGLIPtin (JANUVIA) 50 MG tablet Take 25 mg by mouth daily.    [provider]  sodium bicarbonate 650 MG tablet Take 1,950 mg by mouth daily.     [provider]  torsemide (DEMADEX) 20 MG tablet Take 3 tablets (60 mg total) by mouth daily. 07/13/20   Swayze, Ava, DO    Allergies    Patient has no known allergies.  Review of Systems   Review of Systems  Constitutional: Negative for appetite change and fatigue.  HENT: Negative for congestion, ear discharge and sinus pressure.   Eyes: Negative for discharge.  Respiratory: Negative for cough.   Cardiovascular: Negative for chest pain.  Gastrointestinal: Negative for abdominal pain and diarrhea.  Genitourinary: Negative for frequency and hematuria.  Musculoskeletal: Negative for back pain.  Skin: Negative for rash.  Neurological: Positive for weakness. Negative for seizures and headaches.       Uncontrolled shaking  Psychiatric/Behavioral: Negative for hallucinations.    Physical Exam Updated Vital Signs BP (!) 141/76   Pulse 62   Temp 98 F (36.7 C) (Oral)   Resp 18   Ht 5'  8" (1.727 m)   Wt 63.5 kg   SpO2 91%   BMI 21.29 kg/m   Physical Exam Vitals and nursing note reviewed.  Constitutional:      Appearance: He is well-developed.  HENT:     Head: Normocephalic.     Nose: Nose normal.  Eyes:     General: No scleral icterus.    Conjunctiva/sclera: Conjunctivae normal.  Neck:     Thyroid: No thyromegaly.  Cardiovascular:     Rate and Rhythm: Normal rate and regular rhythm.     Heart sounds: No murmur heard.  No friction rub. No gallop.   Pulmonary:     Breath sounds: No stridor. No wheezing or rales.  Chest:     Chest wall: No tenderness.  Abdominal:     General: There is no distension.     Tenderness: There is no abdominal tenderness. There is no rebound.  Musculoskeletal:        General: Normal range of motion.     Cervical back: Neck supple.     Comments: Shaking arms and legs  Lymphadenopathy:     Cervical: No cervical adenopathy.  Skin:    Findings: No erythema or rash.  Neurological:     Mental Status: He is alert and oriented to person, place, and time.     Motor: No abnormal muscle tone.     Coordination: Coordination normal.  Psychiatric:        Behavior: Behavior normal.     ED Results / Procedures / Treatments   Labs (all labs ordered are listed, but only abnormal results are displayed) Labs Reviewed  CBC WITH DIFFERENTIAL/PLATELET - Abnormal; Notable for the following components:      Result Value   RBC 3.81 (*)    Hemoglobin 12.0 (*)    HCT 36.1 (*)    All other components within normal limits  COMPREHENSIVE METABOLIC PANEL - Abnormal; Notable for the following components:   Sodium 134 (*)  Potassium 5.2 (*)    Chloride 97 (*)    Glucose, Bld 109 (*)    BUN 100 (*)    Creatinine, Ser 5.13 (*)    Calcium 8.5 (*)    ALT 67 (*)    Total Bilirubin 1.7 (*)    GFR, Estimated 10 (*)    All other components within normal limits  RESPIRATORY PANEL BY RT PCR (FLU A&B, COVID)    EKG None  Radiology EEG  adult  Result Date: 07/19/2020 Phillips Odor, MD     07/19/2020  6:03 PM Edom A. Merlene Laughter, MD     [email or web address]       HISTORY: This is a 74 year old who presents with multiple movements suspicious for seizures. MEDICATIONS: Current Outpatient Medications Medication Instructions . acetaminophen (TYLENOL) 1,000 mg, Oral, Every 6 hours PRN . amiodarone (PACERONE) 200 mg, Oral, Daily . aspirin EC 81 mg, Oral, Daily . [START ON 07/20/2020] calcitRIOL (ROCALTROL) 0.5 mcg, Oral, Every M-W-F . cinacalcet (SENSIPAR) 30 mg, Oral, Daily with breakfast . citalopram (CELEXA) 10 MG tablet TAKE ONE (1) TABLET BY MOUTH EVERY DAY . doxazosin (CARDURA) 8 mg, Oral, Daily . hydrALAZINE (APRESOLINE) 75 mg, Oral, 3 times daily . isosorbide mononitrate (IMDUR) 30 mg, Oral, Daily . magnesium oxide (MAG-OX) 200 mg, Oral, Daily . pantoprazole (PROTONIX) 40 MG tablet TAKE ONE TABLET BY MOUTH TWICE A DAY . potassium chloride 20 MEQ TBCR 10 mEq, Oral, Daily . sildenafil (VIAGRA) 50-100 mg, Oral, Daily PRN . sitaGLIPtin (JANUVIA) 25 mg, Oral, Daily . sodium bicarbonate 1,950 mg, Oral, Daily . torsemide (DEMADEX) 60 mg, Oral, Daily ANALYSIS: A 16 channel recording using standard 10 20 measurements is conducted for 25 minutes.  There is a well-formed posterior dominant rhythm of 8.5 to 9 Hz which attenuates with eye opening.  There is beta activity observed in frontal areas.  Mostly awake architecture is noted with some brief drowsiness at times.  Photic stimulation and hyperventilation were not carried out.  Focal or lateral slowing are not observed.  No epileptiform discharges were noted. IMPRESSION: This is a normal recording of awake and drowsy states. Kofi A. Merlene Laughter, M.D. Diplomate, Tax adviser of Psychiatry and Neurology ( Neurology).    Procedures Procedures (including critical care time)  Medications Ordered in ED Medications  LORazepam (ATIVAN) injection 1 mg (1 mg Intravenous Given 07/20/20  1924)    ED Course  I have reviewed the triage vital signs and the nursing notes.  Pertinent labs & imaging results that were available during my care of the patient were reviewed by me and considered in my medical decision making (see chart for details).    MDM Rules/Calculators/A&P                         Myoclonic jerking most likely from toxic metabolite derangements.  Patient will be admitted and see nephrology and possibly neurology again.  Patient may need dialysis.  Patient's jerking was controlled with Ativan  Final Clinical Impression(s) / ED Diagnoses Final diagnoses:  Renal failure, unspecified chronicity    Rx / DC Orders ED Discharge Orders    None       Milton Ferguson, MD 07/22/20 [id number]

## 2020-07-21 DIAGNOSIS — R531 Weakness: Secondary | ICD-10-CM

## 2020-07-21 DIAGNOSIS — E871 Hypo-osmolality and hyponatremia: Secondary | ICD-10-CM

## 2020-07-21 DIAGNOSIS — R9431 Abnormal electrocardiogram [ECG] [EKG]: Secondary | ICD-10-CM

## 2020-07-21 DIAGNOSIS — N185 Chronic kidney disease, stage 5: Secondary | ICD-10-CM

## 2020-07-21 DIAGNOSIS — R251 Tremor, unspecified: Secondary | ICD-10-CM | POA: Diagnosis not present

## 2020-07-21 DIAGNOSIS — E860 Dehydration: Secondary | ICD-10-CM

## 2020-07-21 LAB — GLUCOSE, CAPILLARY
Glucose-Capillary: 95 mg/dL (ref 70–99)
Glucose-Capillary: 85 mg/dL (ref 70–99)
Glucose-Capillary: 224 mg/dL — ABNORMAL HIGH (ref 70–99)
Glucose-Capillary: 208 mg/dL — ABNORMAL HIGH (ref 70–99)
Glucose-Capillary: 182 mg/dL — ABNORMAL HIGH (ref 70–99)

## 2020-07-21 LAB — COMPREHENSIVE METABOLIC PANEL
ALT: 61 U/L — ABNORMAL HIGH (ref 0–44)
AST: 24 U/L (ref 15–41)
Albumin: 3.7 g/dL (ref 3.5–5.0)
Alkaline Phosphatase: 92 U/L (ref 38–126)
Anion gap: 15 (ref 5–15)
BUN: 108 mg/dL — ABNORMAL HIGH (ref 8–23)
CO2: 25 mmol/L (ref 22–32)
Calcium: 8.8 mg/dL — ABNORMAL LOW (ref 8.9–10.3)
Chloride: 98 mmol/L (ref 98–111)
Creatinine, Ser: 5 mg/dL — ABNORMAL HIGH (ref 0.61–1.24)
GFR, Estimated: 10 mL/min — ABNORMAL LOW (ref >60)
Glucose, Bld: 94 mg/dL (ref 70–99)
Potassium: 4.6 mmol/L (ref 3.5–5.1)
Sodium: 138 mmol/L (ref 135–145)
Total Bilirubin: 2 mg/dL — ABNORMAL HIGH (ref 0.3–1.2)
Total Protein: 7.1 g/dL (ref 6.5–8.1)

## 2020-07-21 LAB — CBC
HCT: 37.6 % — ABNORMAL LOW (ref 39.0–52.0)
Hemoglobin: 12.4 g/dL — ABNORMAL LOW (ref 13.0–17.0)
MCH: 31.3 pg (ref 26.0–34.0)
MCHC: 33 g/dL (ref 30.0–36.0)
MCV: 94.9 fL (ref 80.0–100.0)
Platelets: 191 10*3/uL (ref 150–400)
RBC: 3.96 MIL/uL — ABNORMAL LOW (ref 4.22–5.81)
RDW: 14.1 % (ref 11.5–15.5)
WBC: 9.1 10*3/uL (ref 4.0–10.5)
nRBC: 0 % (ref 0.0–0.2)

## 2020-07-21 LAB — APTT: aPTT: 30 seconds (ref 24–36)

## 2020-07-21 LAB — HEPATITIS PANEL, ACUTE
HCV Ab: NONREACTIVE
Hep A IgM: NONREACTIVE
Hep B C IgM: NONREACTIVE
Hepatitis B Surface Ag: NONREACTIVE

## 2020-07-21 LAB — MRSA PCR SCREENING: MRSA by PCR: NEGATIVE

## 2020-07-21 LAB — PHOSPHORUS: Phosphorus: 6.1 mg/dL — ABNORMAL HIGH (ref 2.5–4.6)

## 2020-07-21 LAB — PROTIME-INR
INR: 1.2 (ref 0.8–1.2)
Prothrombin Time: 14.5 seconds (ref 11.4–15.2)

## 2020-07-21 LAB — MAGNESIUM: Magnesium: 2.5 mg/dL — ABNORMAL HIGH (ref 1.7–2.4)

## 2020-07-21 LAB — CBG MONITORING, ED: Glucose-Capillary: 91 mg/dL (ref 70–99)

## 2020-07-21 LAB — BILIRUBIN, DIRECT: Bilirubin, Direct: 0.4 mg/dL — ABNORMAL HIGH (ref 0.0–0.2)

## 2020-07-21 MED ORDER — HYDRALAZINE HCL 20 MG/ML IJ SOLN
10.0000 mg | Freq: Once | INTRAMUSCULAR | Status: AC
  Administered 2020-07-21: 10 mg via INTRAVENOUS
  Filled 2020-07-21: qty 1

## 2020-07-21 MED ORDER — HYDRALAZINE HCL 25 MG PO TABS
75.0000 mg | ORAL_TABLET | Freq: Three times a day (TID) | ORAL | Status: DC
  Administered 2020-07-21 – 2020-07-30 (×22): 75 mg via ORAL
  Filled 2020-07-21 (×24): qty 3

## 2020-07-21 MED ORDER — DOXAZOSIN MESYLATE 2 MG PO TABS
8.0000 mg | ORAL_TABLET | Freq: Every day | ORAL | Status: DC
  Administered 2020-07-21 – 2020-08-01 (×11): 8 mg via ORAL
  Filled 2020-07-21 (×12): qty 4

## 2020-07-21 MED ORDER — ASPIRIN EC 81 MG PO TBEC
81.0000 mg | DELAYED_RELEASE_TABLET | Freq: Every day | ORAL | Status: DC
  Administered 2020-07-21 – 2020-08-02 (×12): 81 mg via ORAL
  Filled 2020-07-21 (×12): qty 1

## 2020-07-21 MED ORDER — CALCITRIOL 0.25 MCG PO CAPS
0.5000 ug | ORAL_CAPSULE | ORAL | Status: DC
  Administered 2020-07-22 – 2020-07-29 (×4): 0.5 ug via ORAL
  Filled 2020-07-21 (×8): qty 2

## 2020-07-21 MED ORDER — ENSURE ENLIVE PO LIQD
237.0000 mL | Freq: Two times a day (BID) | ORAL | Status: DC
  Administered 2020-07-21 (×2): 237 mL via ORAL

## 2020-07-21 MED ORDER — AMIODARONE HCL 200 MG PO TABS
200.0000 mg | ORAL_TABLET | Freq: Every day | ORAL | Status: DC
  Administered 2020-07-21 – 2020-08-01 (×11): 200 mg via ORAL
  Filled 2020-07-21 (×12): qty 1

## 2020-07-21 MED ORDER — HEPARIN SODIUM (PORCINE) 5000 UNIT/ML IJ SOLN
5000.0000 [IU] | Freq: Three times a day (TID) | INTRAMUSCULAR | Status: DC
  Administered 2020-07-21 – 2020-08-02 (×31): 5000 [IU] via SUBCUTANEOUS
  Filled 2020-07-21 (×33): qty 1

## 2020-07-21 MED ORDER — NEPRO/CARBSTEADY PO LIQD
237.0000 mL | Freq: Two times a day (BID) | ORAL | Status: DC
  Administered 2020-07-21 – 2020-07-24 (×3): 237 mL via ORAL

## 2020-07-21 MED ORDER — CINACALCET HCL 30 MG PO TABS
30.0000 mg | ORAL_TABLET | Freq: Every day | ORAL | Status: DC
  Administered 2020-07-21 – 2020-08-02 (×12): 30 mg via ORAL
  Filled 2020-07-21 (×13): qty 1

## 2020-07-21 MED ORDER — B COMPLEX-C PO TABS
1.0000 | ORAL_TABLET | Freq: Every day | ORAL | Status: DC
  Administered 2020-07-21 – 2020-08-02 (×12): 1 via ORAL
  Filled 2020-07-21 (×14): qty 1

## 2020-07-21 MED ORDER — INSULIN ASPART 100 UNIT/ML ~~LOC~~ SOLN
0.0000 [IU] | SUBCUTANEOUS | Status: DC
  Administered 2020-07-21: 3 [IU] via SUBCUTANEOUS
  Administered 2020-07-21: 2 [IU] via SUBCUTANEOUS
  Administered 2020-07-21: 3 [IU] via SUBCUTANEOUS
  Administered 2020-07-22 (×2): 2 [IU] via SUBCUTANEOUS
  Administered 2020-07-23 – 2020-07-24 (×4): 1 [IU] via SUBCUTANEOUS
  Administered 2020-07-24: 2 [IU] via SUBCUTANEOUS

## 2020-07-21 MED ORDER — ISOSORBIDE MONONITRATE ER 60 MG PO TB24
30.0000 mg | ORAL_TABLET | Freq: Every day | ORAL | Status: DC
  Administered 2020-07-21 – 2020-08-01 (×11): 30 mg via ORAL
  Filled 2020-07-21 (×12): qty 1

## 2020-07-21 MED ORDER — CHLORHEXIDINE GLUCONATE CLOTH 2 % EX PADS
6.0000 | MEDICATED_PAD | Freq: Every day | CUTANEOUS | Status: DC
  Administered 2020-07-21 – 2020-07-30 (×7): 6 via TOPICAL

## 2020-07-21 MED ORDER — PANTOPRAZOLE SODIUM 40 MG PO TBEC
40.0000 mg | DELAYED_RELEASE_TABLET | Freq: Two times a day (BID) | ORAL | Status: DC
  Administered 2020-07-21 – 2020-08-02 (×24): 40 mg via ORAL
  Filled 2020-07-21 (×23): qty 1

## 2020-07-21 MED ORDER — SODIUM CHLORIDE 0.9 % IV SOLN: Freq: Once | INTRAVENOUS | Status: AC

## 2020-07-21 NOTE — Progress Notes (Signed)
PROGRESS NOTE    Bradley Black  YQM:[id number] DOB: [birth year] DOA: 07/20/2020 PCP: Susy Frizzle, MD  Brief Narrative:  74 year old white male Ischemic cardiomyopathy with systolic/diastolic parameters--History of CABG x4, left carotid artery stenosis--cardiac arrest with angioplasty 1997, stenting to RCA 99 and 2001-finally CABG X4 2013 Dr. 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 NSVT--ablation back in 06/2020 not indicated BPH  Recent multiple hospitalizations 10/8 through 07/13/2020 with chest pain combined heart failure exacerbation-went to [facility] for cardiac cath which showed LVEF 25% with severely reduced RV-at that admission creatinine trended upwards to 4.4 and was to be set up with nephrology in the outpatient setting  Readmitted 1018 through 1019 with myoclonic jerking secondary probably to Celexa in the setting of renal insufficiency-not discharged at that time creatinine was 4.1 he was readmitted because of repeated jerking  Emergency room work-up showed hemodynamically stable BUNs/creatinine 100/5.1 total bilirubin 1.7 potassium 5.2 Ativan was given to control the tremors Patient was sleepy on arrival  Assessment & Plan:   Principal Problem:   Tremors of nervous system Active Problems:   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   Ischemic cardiomyopathy   Essential hypertension   Type 2 diabetes mellitus with diabetic chronic kidney disease (HCC)   Hyperkalemia   GERD (gastroesophageal reflux disease)   Myoclonic jerking   Hypocalcemia   CKD (chronic kidney disease), stage V (HCC)   Dehydration   Hyponatremia   Prolonged QT interval   Generalized weakness   1. Myoclonic jerks with generalized weakness a. Myoclonus seems to be resolved b. Some of this may be asterixis from worsening azotemia-defer to nephrology for further planning c. Would hold Ativan for now and observe 2. Chronic kidney disease stage V a. Nephrology consulted  await input b. Does not have access so this may need to be coordinated 3. Hypocalcemia hyponatremia hyperkalemia a. Corrected calcium is 9, more concerning is phosphorus of 6.1-continuing Sensipar 30 daily, calcitriol 0.1 Monday Wednesday Friday 4. Elevated total bili a. Unclear etiology, patient may have nonalcoholic steatohepatitis b. We will check hepatitis panel and monitor trends 5. Prolonged QTC 504 a. Keep on monitors and try to avoid QT prolonging agents 6. DM TY 2 A1c 5.7 a. CBGs ranging 85-91, continue sliding scale only at this time, he usually is on Januvia 25 which has been held 7. Reflux 8. History of CABG X4 a. Continue Imdur 30 daily Cardura 8 daily and hydralazine 75 3 times daily b. Adjust medications as needed c. For dialysis eventually 9. History of PVC NSVT a. Resumed amiodarone 200 daily for NSVT-monitor trends slightly bradycardic on monitors  DVT prophylaxis: On heparin Code Status: Full CODE STATUS Family Communication: none at the bedside Disposition: Inpatient  Status is: Inpatient  Remains inpatient appropriate because:Persistent severe electrolyte disturbances, Ongoing active pain requiring inpatient pain management, Altered mental status and IV treatments appropriate due to intensity of illness or inability to take PO   Dispo: The patient is from: Home              Anticipated d/c is to: Unclear at this time              Anticipated d/c date is: > 3 days              Patient currently is not medically stable to d/c.       Consultants:   Nephrology  Procedures: None  Antimicrobials: No   Subjective: Awake  alert coherent no distress feels that the shaking is better but when he holds over his hand he is tremulous  Objective: Vitals:   07/21/20 0500 07/21/20 0700 07/21/20 0731 07/21/20 0800  BP:  (!) 204/73  (!) 205/93  Pulse: (!) 47 (!) 53 (!) 53 62  Resp: 19 (!) 34 18 16  Temp:      TempSrc:      SpO2: 97% (!) 87% 93% 100%    Weight:      Height:        Intake/Output Summary (Last 24 hours) at 07/21/2020 0858 Last data filed at 07/21/2020 0231 Gross per 24 hour  Intake 978.87 ml  Output --  Net 978.87 ml   Filed Weights   07/20/20 1735 07/21/20 0200  Weight: 63.5 kg 61.4 kg    Examination:  General exam: Coherent pleasant flat affect no distress EOMI NCAT no pallor no icterus Respiratory system: Clinically clear no rales no rhonchi Cardiovascular system: S1-S2 no murmur no rub no gallop Gastrointestinal system: Soft nontender no rebound no guarding no hepatosplenomegaly slight tremor on holding her arms. Central nervous system: Slight tremor on holding her arms Extremities: No lower extremity edema Skin: See above Psychiatry: As above  Data Reviewed: I have personally reviewed following labs and imaging studies Sodium 138 potassium 4.6 CO2 25 BUN/creatinine 108/5.00 WBC 9.1 Hemoglobin 12.4 hematocrit 37.6 platelet 191 INR 1.2  Radiology Studies: EEG adult  Result Date: July 26, 2020 Phillips Odor, MD     July 26, 2020  6:03 PM [place] A. Merlene Laughter, MD     [email or web address]       HISTORY: This is a 74 year old who presents with multiple movements suspicious for seizures. MEDICATIONS: Current Outpatient Medications Medication Instructions . acetaminophen (TYLENOL) 1,000 mg, Oral, Every 6 hours PRN . amiodarone (PACERONE) 200 mg, Oral, Daily . aspirin EC 81 mg, Oral, Daily . [START ON 07/20/2020] calcitRIOL (ROCALTROL) 0.5 mcg, Oral, Every M-W-F . cinacalcet (SENSIPAR) 30 mg, Oral, Daily with breakfast . citalopram (CELEXA) 10 MG tablet TAKE ONE (1) TABLET BY MOUTH EVERY DAY . doxazosin (CARDURA) 8 mg, Oral, Daily . hydrALAZINE (APRESOLINE) 75 mg, Oral, 3 times daily . isosorbide mononitrate (IMDUR) 30 mg, Oral, Daily . magnesium oxide (MAG-OX) 200 mg, Oral, Daily . pantoprazole (PROTONIX) 40 MG tablet TAKE ONE TABLET BY MOUTH TWICE A DAY . potassium chloride 20 MEQ TBCR 10 mEq, Oral,  Daily . sildenafil (VIAGRA) 50-100 mg, Oral, Daily PRN . sitaGLIPtin (JANUVIA) 25 mg, Oral, Daily . sodium bicarbonate 1,950 mg, Oral, Daily . torsemide (DEMADEX) 60 mg, Oral, Daily ANALYSIS: A 16 channel recording using standard 10 20 measurements is conducted for 25 minutes.  There is a well-formed posterior dominant rhythm of 8.5 to 9 Hz which attenuates with eye opening.  There is beta activity observed in frontal areas.  Mostly awake architecture is noted with some brief drowsiness at times.  Photic stimulation and hyperventilation were not carried out.  Focal or lateral slowing are not observed.  No epileptiform discharges were noted. IMPRESSION: This is a normal recording of awake and drowsy states. Kofi A. Merlene Laughter, M.D. Diplomate, Tax adviser of Psychiatry and Neurology ( Neurology).     Scheduled Meds: . amiodarone  200 mg Oral Daily  . aspirin EC  81 mg Oral Daily  . [START ON 07/22/2020] calcitRIOL  0.5 mcg Oral Q M,W,F  . Chlorhexidine Gluconate Cloth  6 each Topical Daily  . cinacalcet  30 mg Oral Q breakfast  . doxazosin  8 mg  Oral Daily  . feeding supplement  237 mL Oral BID BM  . heparin  5,000 Units Subcutaneous Q8H  . hydrALAZINE  75 mg Oral TID  . insulin aspart  0-9 Units Subcutaneous Q4H  . isosorbide mononitrate  30 mg Oral Daily  . pantoprazole  40 mg Oral BID   Continuous Infusions:   LOS: 1 day    Time spent: [place], MD [facility] To contact the attending provider between 7A-7P or the covering provider during after hours 7P-7A, please log into the web site [email or web address] and access using universal [place] password for that web site. If you do not have the password, please call the hospital operator.  07/21/2020, 8:58 AM

## 2020-07-21 NOTE — Progress Notes (Signed)
Pt spouse called and updated about patient moving to room 322.

## 2020-07-21 NOTE — Progress Notes (Signed)
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    GOAL:   Patient will meet greater than or equal to 90% of their needs    MONITOR:   PO intake, Supplement acceptance, Weight trends, Labs, I & O's  REASON FOR ASSESSMENT:   Malnutrition Screening Tool    ASSESSMENT:  74 year old male with history significant for ischemic cardiomyopathy, CAD s/p CABG x4, combined CHF, DM2, diabetic retinopathy, CKD stage V, GERD, who was recently discharged on 10/19 after hospitalization for acute onset of uncontrollable tremors presented with continued shaking and jerking movements since returning home. Patient admitted for tremors of nervous system.  Patient resting quietly in bed this afternoon, wife present in room. He is easily awakened with name call, endorsed having a good appetite and intake of lunch, wife reports he ate everything on tray except for the fruit. He usually eats 3 meals/day at home, recalls cereal or eggs with grits and bacon for breakfast, sandwich for lunch, and balanced dinners prepared by wife. Wife reports pt appetite has been poor over the prior few weeks, endorsed mostly soups with little intake. Pt endorses some weight loss over the past few months, recalls usually weighing around 165 lb. Per chart, weights have trended down ~21 lbs (13.6%) over the past 3 months; significant. Suspect some weight loss due to fluid shifts secondary to CHF,  noted BNP 4500 on 07/08/20 as well as BLE mild pitting edema on 07/13/20 per flowsheets. Given trends, decreased appetite as well as mild-severe fat and muscle depletions noted on exam, pt meets criteria for moderate malnutrition and is at risk for worsening nutritional status given CHF and CKD stage V. Wife stated pt is pending cath placement for initiation of hemodialysis. RD educated on the importance of nutrition, increased calorie/protein needs r/t HD, encouraged po intake of meals and supplements. Pt reports enjoying the Ensure he had this morning, however phosphorus is elevated, will d/c Ensure and provide Nepro as this is lower in phosphorus. Pt agreeable to trying and requested chocolate flavor.   Medications reviewed and include: Cacitriol, Sensipar, Ensure, SSI, Imdur, Protonix  Labs: CBGs 224,85,95, Cr 5.0 (H), BUN 108 (H), P 6.1 (H), Mg 2.5 (H)   NUTRITION - FOCUSED PHYSICAL EXAM:    Most Recent Value  Orbital Region Moderate depletion  Upper Arm Region Moderate depletion  Thoracic and Lumbar Region Mild depletion  Buccal Region Mild depletion  [pt with facial hair, difficult to assess and suspect likely moderate]  Temple Region Moderate depletion  Clavicle Bone Region Mild depletion  Clavicle and Acromion Bone Region Mild depletion  Scapular Bone Region Unable to assess  Dorsal Hand Moderate depletion  Patellar Region Severe depletion  Anterior Thigh Region Severe depletion  Posterior Calf Region Severe depletion  Edema (RD Assessment) None  Hair Reviewed  Eyes Reviewed  Mouth Reviewed  Skin Reviewed  Nails Reviewed       Diet Order:   Diet Order  Diet Heart Room service appropriate? Yes; Fluid consistency: Thin  Diet effective now                 EDUCATION NEEDS:   Education needs have been addressed  Skin:  Skin Assessment:  (scattered ecchymosis; bilateral arm)  Last BM:  10/17  Height:   Ht Readings from Last 1 Encounters:  07/21/20 5'  8" (1.727 m)    Weight:   Wt Readings from Last 1 Encounters:  07/21/20 61.4 kg    BMI:  Body mass index is 20.58 kg/m.  Estimated Nutritional Needs:   Kcal:  1900-2100  Protein:  90-105  Fluid:  UOP + 1000 ml   Lajuan Lines, RD, LDN Clinical Nutrition After Hours/Weekend Pager # in [place]

## 2020-07-22 ENCOUNTER — Other Ambulatory Visit: Payer: Self-pay | Admitting: *Deleted

## 2020-07-22 ENCOUNTER — Ambulatory Visit: Payer: Self-pay | Admitting: *Deleted

## 2020-07-22 DIAGNOSIS — R251 Tremor, unspecified: Secondary | ICD-10-CM | POA: Diagnosis not present

## 2020-07-22 DIAGNOSIS — E440 Moderate protein-calorie malnutrition: Secondary | ICD-10-CM | POA: Insufficient documentation

## 2020-07-22 LAB — CBC WITH DIFFERENTIAL/PLATELET
Abs Immature Granulocytes: 0.03 10*3/uL (ref 0.00–0.07)
Basophils Absolute: 0.1 10*3/uL (ref 0.0–0.1)
Basophils Relative: 1 %
Eosinophils Absolute: 0.3 10*3/uL (ref 0.0–0.5)
Eosinophils Relative: 3 %
HCT: 36.3 % — ABNORMAL LOW (ref 39.0–52.0)
Hemoglobin: 11.9 g/dL — ABNORMAL LOW (ref 13.0–17.0)
Immature Granulocytes: 0 %
Lymphocytes Relative: 10 %
Lymphs Abs: 1 10*3/uL (ref 0.7–4.0)
MCH: 31.7 pg (ref 26.0–34.0)
MCHC: 32.8 g/dL (ref 30.0–36.0)
MCV: 96.8 fL (ref 80.0–100.0)
Monocytes Absolute: 0.8 10*3/uL (ref 0.1–1.0)
Monocytes Relative: 8 %
Neutro Abs: 7.5 10*3/uL (ref 1.7–7.7)
Neutrophils Relative %: 78 %
Platelets: 201 10*3/uL (ref 150–400)
RBC: 3.75 MIL/uL — ABNORMAL LOW (ref 4.22–5.81)
RDW: 14.2 % (ref 11.5–15.5)
WBC: 9.6 10*3/uL (ref 4.0–10.5)
nRBC: 0 % (ref 0.0–0.2)

## 2020-07-22 LAB — GLUCOSE, CAPILLARY
Glucose-Capillary: 118 mg/dL — ABNORMAL HIGH (ref 70–99)
Glucose-Capillary: 154 mg/dL — ABNORMAL HIGH (ref 70–99)
Glucose-Capillary: 163 mg/dL — ABNORMAL HIGH (ref 70–99)
Glucose-Capillary: 164 mg/dL — ABNORMAL HIGH (ref 70–99)
Glucose-Capillary: 81 mg/dL (ref 70–99)
Glucose-Capillary: 91 mg/dL (ref 70–99)

## 2020-07-22 LAB — RENAL FUNCTION PANEL
Albumin: 3.6 g/dL (ref 3.5–5.0)
Anion gap: 14 (ref 5–15)
BUN: 108 mg/dL — ABNORMAL HIGH (ref 8–23)
CO2: 23 mmol/L (ref 22–32)
Calcium: 8.5 mg/dL — ABNORMAL LOW (ref 8.9–10.3)
Chloride: 98 mmol/L (ref 98–111)
Creatinine, Ser: 4.59 mg/dL — ABNORMAL HIGH (ref 0.61–1.24)
GFR, Estimated: 13 mL/min — ABNORMAL LOW (ref >60)
Glucose, Bld: 100 mg/dL — ABNORMAL HIGH (ref 70–99)
Phosphorus: 5.8 mg/dL — ABNORMAL HIGH (ref 2.5–4.6)
Potassium: 4.6 mmol/L (ref 3.5–5.1)
Sodium: 135 mmol/L (ref 135–145)

## 2020-07-22 MED ORDER — LORAZEPAM 0.5 MG PO TABS
0.5000 mg | ORAL_TABLET | Freq: Two times a day (BID) | ORAL | Status: DC | PRN
  Administered 2020-07-22 – 2020-07-28 (×2): 0.5 mg via ORAL
  Filled 2020-07-22 (×2): qty 1

## 2020-07-22 MED ORDER — CITALOPRAM HYDROBROMIDE 10 MG PO TABS: 5.0000 mg | ORAL_TABLET | Freq: Every day | ORAL | Status: DC

## 2020-07-22 NOTE — Consult Note (Signed)
[facility]  HISTORY AND PHYSICAL  Bradley Black is an 74 y.o. male.    Chief Complaint: involuntary jerking   HPI: Pt is a 72M with advanced CKD, CAD s/p CABG x 4, previous cardiac arrest in 1997 (during angioplasty), DM II, BPH, NSVT/ PVCs, systolic/ diastolic CHF exacerbation with EF 25% and G3DD 07/09/20 and HTN who is now seen in consultation at the request of Dr. Verlon Black for eval and recs re: involuntary jerking in the setting of CKD.    Briefly, pt was admitted to [facility] 10/8-10/13 for acute on chronic systolic CHF.  Was started torsemide 60 mg daily during that admission.  Discharge Cr was 3.85 10/13.  He returned to [facility] on 10/18 with myoclonic jerking thought to be related to hypocalcemia/ recent celexa initiation.  He was optimized and discharged 10/19.  Neurology was consulted at that time and leading etiologies of myoclonus were metabolic derangements vs celexa.  He comes back 10/20 with worsening symtpoms.  Saw Dr Bradley Black in clinic 10/20 and was noted to have marked myoclonus and was sent to ED.    Cr is over 5 and BUN is > 100.  Pt notes he's lost 20 lbs in about a month.  Appetite is OK but energy is down.  Ativan helps myoclonus but comes right back after it wears off.  K, Ca, CO2, Mg are optimized.  In this setting we are asked to see.    He notes that he and Dr Bradley Black had talked about dialysis before but he wasn't open to the idea of getting an access placed when it was advised- "evidently I was ready for it"- he says today.  Agreeable to starting dialysis.    PMH: Past Medical History:  Diagnosis Date  . BPH (benign prostatic hyperplasia)   . CAD S/P percutaneous coronary angioplasty    a. PTCA of [place] b. PCI with BMS to LAD in 1997 c. RCA PCI [place] d. s/p CABG in 11/2011 with LIMA-LAD, SVG-PDA, SVG-OM2, and SVG-D1  . Chronic back pain   . CKD (chronic kidney disease), stage III ([place])   . Cyst of bursa    R shoulder  . Diabetic retinopathy  ([place])   . DM (diabetes mellitus), type 2 with renal complications ([place])   . Essential hypertension   . Frequent PVCs   . GERD (gastroesophageal reflux disease)   . Ischemic cardiomyopathy 11/2011   Intra-OP TEE: EF 40-45%, no regional WMA; improved Anterior WM post CABG.  . Left carotid artery stenosis   . Mixed hyperlipidemia   . 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 BACK SURGERY  1970  . CARDIAC CATHETERIZATION  2013  . CORONARY ANGIOPLASTY  1994   OM  . CORONARY ANGIOPLASTY  1997   LAD  . CORONARY ANGIOPLASTY WITH STENT PLACEMENT  1999   RCA  . CORONARY ANGIOPLASTY WITH STENT PLACEMENT  2001   RCA  . CORONARY ARTERY BYPASS GRAFT  12/27/2011   Procedure: CORONARY ARTERY BYPASS GRAFTING (CABG);  Surgeon: Bradley Alberts, MD;  Location: [facility];  Service: Open Heart Surgery;  Laterality: N/A;  Times four. On pump. Using endoscopically harvested right greater saphenous vein and left internal mammary artery.   [name] HERNIA REPAIR    . INCISION AND DRAINAGE OF  WOUND  2006   [place]  . INTRAOPERATIVE TRANSESOPHAGEAL ECHOCARDIOGRAM  12/27/2011   Global hypokinesis with EF of 40-45%, improved LAD distribution wall motion.  [name] LEFT HEART CATHETERIZATION WITH CORONARY ANGIOGRAM N/A 12/13/2011   Procedure: LEFT HEART CATHETERIZATION WITH CORONARY ANGIOGRAM;  Surgeon: Bradley Man, MD;  Location: [facility] CATH LAB;  Service: Cardiovascular;  Laterality: N/A;  . LUMBAR FUSION    . MASS EXCISION  10/24/11   R arm  . RIGHT HEART CATH N/A 07/12/2020   Procedure: RIGHT HEART CATH;  Surgeon: Bradley Crome, MD;  Location: [facility] CV LAB;  Service: Cardiovascular;  Laterality: N/A;  . TRANSESOPHAGEAL ECHOCARDIOGRAM  2013    Past Medical History:  Diagnosis Date  . BPH (benign prostatic hyperplasia)   . CAD S/P percutaneous coronary angioplasty    a. PTCA of [place] b.  PCI with BMS to LAD in 1997 c. RCA PCI [place] d. s/p CABG in 11/2011 with LIMA-LAD, SVG-PDA, SVG-OM2, and SVG-D1  . Chronic back pain   . CKD (chronic kidney disease), stage III ([place])   . Cyst of bursa    R shoulder  . Diabetic retinopathy ([place])   . DM (diabetes mellitus), type 2 with renal complications ([place])   . Essential hypertension   . Frequent PVCs   . GERD (gastroesophageal reflux disease)   . Ischemic cardiomyopathy 11/2011   Intra-OP TEE: EF 40-45%, no regional WMA; improved Anterior WM post CABG.  . Left carotid artery stenosis   . Mixed hyperlipidemia   . 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   LIMA to LAD, SVG to D1, SVG to OM2, SVG to PDA, EVH via right thigh and leg    Medications:   Scheduled: . amiodarone  200 mg Oral Daily  . aspirin EC  81 mg Oral Daily  . B-complex with vitamin C  1 tablet Oral Daily  . calcitRIOL  0.5 mcg Oral Q M,W,F  . Chlorhexidine Gluconate Cloth  6 each Topical Daily  . cinacalcet  30 mg Oral Q breakfast  . doxazosin  8 mg Oral Daily  . feeding supplement (NEPRO CARB STEADY)  237 mL Oral BID BM  . heparin  5,000 Units Subcutaneous Q8H  . hydrALAZINE  75 mg Oral TID  . insulin aspart  0-9 Units Subcutaneous Q4H  . isosorbide mononitrate  30 mg Oral Daily  . pantoprazole  40 mg Oral BID    Medications Prior to Admission  Medication Sig Dispense Refill  . acetaminophen (TYLENOL) 500 MG tablet Take 1,000 mg by mouth every 6 (six) hours as needed for moderate pain or headache.    [name] amiodarone (PACERONE) 200 MG tablet Take 1 tablet (200 mg total) by mouth daily. 90 tablet 3  . aspirin EC 81 MG tablet Take 81 mg by mouth daily.    . calcitRIOL (ROCALTROL) 0.5 MCG capsule Take 1 capsule (0.5 mcg total) by mouth every Monday, Wednesday, and Friday. 45 capsule 1  . cinacalcet (SENSIPAR) 30 MG tablet Take 30 mg by mouth daily with breakfast.     . doxazosin (CARDURA) 8 MG tablet Take 1 tablet (8 mg total) by mouth daily. 90 tablet 3  . hydrALAZINE  (APRESOLINE) 50 MG tablet Take 1.5 tablets (75 mg total) by mouth 3 (three) times daily. 135 tablet 0  . isosorbide mononitrate (IMDUR) 30 MG 24 hr tablet Take 1 tablet (30 mg total) by mouth daily. 30 tablet 0  . pantoprazole (PROTONIX) 40  MG tablet TAKE ONE TABLET BY MOUTH TWICE A DAY (Patient taking differently: Take 40 mg by mouth 2 (two) times daily. ) 180 tablet 3  . sildenafil (VIAGRA) 100 MG tablet Take 0.5-1 tablets (50-100 mg total) by mouth daily as needed for erectile dysfunction. 60 tablet 5  . sitaGLIPtin (JANUVIA) 50 MG tablet Take 25 mg by mouth daily.    . sodium bicarbonate 650 MG tablet Take 1,950 mg by mouth daily.     [name] torsemide (DEMADEX) 20 MG tablet Take 3 tablets (60 mg total) by mouth daily. 90 tablet 0  . magnesium oxide (MAG-OX) 400 MG tablet Take 0.5 tablets (200 mg total) by mouth daily. 30 tablet 0    ALLERGIES:  No Known Allergies  FAM HX: Family History  Problem Relation Age of Onset  . Cancer Mother        breast  . Cancer Father        bone  . [place] cancer Neg Hx     Social History:   reports that he quit smoking about 33 years ago. His smoking use included cigarettes. He has never used smokeless tobacco. He reports that he does not drink alcohol and does not use drugs.  ROS: ROS: all other systems reviewed and are negative except as per HPI  Blood pressure (!) 142/71, pulse 65, temperature 98.3 F (36.8 C), temperature source Oral, resp. rate 16, height 5\' 8"  (1.727 m), weight 61.4 kg, SpO2 98 %. PHYSICAL EXAM: Physical Exam  GEN NAD, lying flat in bed HEENT EOMI PERRL NECK no JVD PULM clear anteriorly CV RRR no m/r/g ABD thin, soft EXT no LE edema NEURO AAO x3, myoclonus esp of the arms as we have our conversation.  No frank asterixis    Results for orders placed or performed during the hospital encounter of 07/20/20 (from the past 48 hour(s))  CBC with Differential/Platelet     Status: Abnormal   Collection Time: 07/20/20  7:15 PM   Result Value Ref Range   WBC 9.0 4.0 - 10.5 K/uL   RBC 3.81 (L) 4.22 - 5.81 MIL/uL   Hemoglobin 12.0 (L) 13.0 - 17.0 g/dL   HCT 36.1 (L) 39 - 52 %   MCV 94.8 80.0 - 100.0 fL   MCH 31.5 26.0 - 34.0 pg   MCHC 33.2 30.0 - 36.0 g/dL   RDW 14.2 11.5 - 15.5 %   Platelets 200 150 - 400 K/uL   nRBC 0.0 0.0 - 0.2 %   Neutrophils Relative % 81 %   Neutro Abs 7.1 1.7 - 7.7 K/uL   Lymphocytes Relative 9 %   Lymphs Abs 0.8 0.7 - 4.0 K/uL   Monocytes Relative 9 %   Monocytes Absolute 0.8 0.1 - 1.0 K/uL   Eosinophils Relative 1 %   Eosinophils Absolute 0.1 0.0 - 0.5 K/uL   Basophils Relative 0 %   Basophils Absolute 0.0 0.0 - 0.1 K/uL   Immature Granulocytes 0 %   Abs Immature Granulocytes 0.03 0.00 - 0.07 K/uL    Comment: Performed at [facility], [address], [place], [place] [address]  Comprehensive metabolic panel     Status: Abnormal   Collection Time: 07/20/20  7:15 PM  Result Value Ref Range   Sodium 134 (L) 135 - 145 mmol/L   Potassium 5.2 (H) 3.5 - 5.1 mmol/L   Chloride 97 (L) 98 - 111 mmol/L   CO2 25 22 - 32 mmol/L   Glucose, Bld 109 (H) 70 -  99 mg/dL    Comment: Glucose reference range applies only to samples taken after fasting for at least 8 hours.   BUN 100 (H) 8 - 23 mg/dL    Comment: RESULTS CONFIRMED BY MANUAL DILUTION   Creatinine, Ser 5.13 (H) 0.61 - 1.24 mg/dL   Calcium 8.5 (L) 8.9 - 10.3 mg/dL   Total Protein 7.1 6.5 - 8.1 g/dL   Albumin 3.8 3.5 - 5.0 g/dL   AST 26 15 - 41 U/L   ALT 67 (H) 0 - 44 U/L   Alkaline Phosphatase 102 38 - 126 U/L   Total Bilirubin 1.7 (H) 0.3 - 1.2 mg/dL   GFR, Estimated 10 (L) >60 mL/min   Anion gap 12 5 - 15    Comment: Performed at [facility], [address] [place], [place] [address]  Respiratory Panel by RT PCR (Flu A&B, Covid) - Nasopharyngeal Swab     Status: None   Collection Time: 07/20/20  9:42 PM   Specimen: Nasopharyngeal Swab  Result Value Ref Range   SARS Coronavirus 2 by RT PCR NEGATIVE NEGATIVE    Comment:  (NOTE) SARS-CoV-2 target nucleic acids are NOT DETECTED.  The SARS-CoV-2 RNA is generally detectable in upper respiratoy specimens during the acute phase of infection. The lowest concentration of SARS-CoV-2 viral copies this assay can detect is 131 copies/mL. A negative result does not preclude SARS-Cov-2 infection and should not be used as the sole basis for treatment or other patient management decisions. A negative result may occur with  improper specimen collection/handling, submission of specimen other than nasopharyngeal swab, presence of viral mutation(s) within the areas targeted by this assay, and inadequate number of viral copies (<131 copies/mL). A negative result must be combined with clinical observations, patient history, and epidemiological information. The expected result is Negative.  Fact Sheet for Patients:  PinkCheek.be  Fact Sheet for Healthcare Providers:  GravelBags.it  This test is no t yet approved or cleared by the [place] FDA and  has been authorized for detection and/or diagnosis of SARS-CoV-2 by FDA under an Emergency Use Authorization (EUA). This EUA will remain  in effect (meaning this test can be used) for the duration of the COVID-19 declaration under Section 564(b)(1) of the Act, 21 U.S.C. section 360bbb-3(b)(1), unless the authorization is terminated or revoked sooner.     Influenza A by PCR NEGATIVE NEGATIVE   Influenza B by PCR NEGATIVE NEGATIVE    Comment: (NOTE) The Xpert Xpress SARS-CoV-2/FLU/RSV assay is intended as an aid in  the diagnosis of influenza from Nasopharyngeal swab specimens and  should not be used as a sole basis for treatment. Nasal washings and  aspirates are unacceptable for Xpert Xpress SARS-CoV-2/FLU/RSV  testing.  Fact Sheet for Patients: PinkCheek.be  Fact Sheet for Healthcare  Providers: GravelBags.it  This test is not yet approved or cleared by the [place] FDA and  has been authorized for detection and/or diagnosis of SARS-CoV-2 by  FDA under an Emergency Use Authorization (EUA). This EUA will remain  in effect (meaning this test can be used) for the duration of the  Covid-19 declaration under Section 564(b)(1) of the Act, 21  U.S.C. section 360bbb-3(b)(1), unless the authorization is  terminated or revoked. Performed at [facility], [address], [place], [place] [address]   CBG monitoring, ED     Status: None   Collection Time: 07/21/20  1:13 AM  Result Value Ref Range   Glucose-Capillary 91 70 - 99 mg/dL  Comment: Glucose reference range applies only to samples taken after fasting for at least 8 hours.  MRSA PCR Screening     Status: None   Collection Time: 07/21/20  2:07 AM   Specimen: Nasopharyngeal  Result Value Ref Range   MRSA by PCR NEGATIVE NEGATIVE    Comment:        The GeneXpert MRSA Assay (FDA approved for NASAL specimens only), is one component of a comprehensive MRSA colonization surveillance program. It is not intended to diagnose MRSA infection nor to guide or monitor treatment for MRSA infections. Performed at [facility], [address], [place], [place] [address]   Glucose, capillary     Status: None   Collection Time: 07/21/20  4:19 AM  Result Value Ref Range   Glucose-Capillary 95 70 - 99 mg/dL    Comment: Glucose reference range applies only to samples taken after fasting for at least 8 hours.  Bilirubin, direct     Status: Abnormal   Collection Time: 07/21/20  4:30 AM  Result Value Ref Range   Bilirubin, Direct 0.4 (H) 0.0 - 0.2 mg/dL    Comment: Performed at [facility], [address] [place], [place] [address]  Comprehensive metabolic panel     Status: Abnormal   Collection Time: 07/21/20  4:30 AM  Result Value Ref Range   Sodium 138 135 - 145 mmol/L   Potassium 4.6 3.5 -  5.1 mmol/L   Chloride 98 98 - 111 mmol/L   CO2 25 22 - 32 mmol/L   Glucose, Bld 94 70 - 99 mg/dL    Comment: Glucose reference range applies only to samples taken after fasting for at least 8 hours.   BUN 108 (H) 8 - 23 mg/dL    Comment: RESULTS CONFIRMED BY MANUAL DILUTION   Creatinine, Ser 5.00 (H) 0.61 - 1.24 mg/dL   Calcium 8.8 (L) 8.9 - 10.3 mg/dL   Total Protein 7.1 6.5 - 8.1 g/dL   Albumin 3.7 3.5 - 5.0 g/dL   AST 24 15 - 41 U/L   ALT 61 (H) 0 - 44 U/L   Alkaline Phosphatase 92 38 - 126 U/L   Total Bilirubin 2.0 (H) 0.3 - 1.2 mg/dL   GFR, Estimated 10 (L) >60 mL/min   Anion gap 15 5 - 15    Comment: Performed at [facility], [address], [place], [place] [address]  CBC     Status: Abnormal   Collection Time: 07/21/20  4:30 AM  Result Value Ref Range   WBC 9.1 4.0 - 10.5 K/uL   RBC 3.96 (L) 4.22 - 5.81 MIL/uL   Hemoglobin 12.4 (L) 13.0 - 17.0 g/dL   HCT 37.6 (L) 39 - 52 %   MCV 94.9 80.0 - 100.0 fL   MCH 31.3 26.0 - 34.0 pg   MCHC 33.0 30.0 - 36.0 g/dL   RDW 14.1 11.5 - 15.5 %   Platelets 191 150 - 400 K/uL   nRBC 0.0 0.0 - 0.2 %    Comment: Performed at [facility], [address] [place], [place] [address]  Protime-INR     Status: None   Collection Time: 07/21/20  4:30 AM  Result Value Ref Range   Prothrombin Time 14.5 11.4 - 15.2 seconds   INR 1.2 0.8 - 1.2    Comment: (NOTE) INR goal varies based on device and disease states. Performed at [facility], [address], [place], [place] [address]   APTT     Status:  None   Collection Time: 07/21/20  4:30 AM  Result Value Ref Range   aPTT 30 24 - 36 seconds    Comment: Performed at [facility], [address] [place], [place] [address]  Magnesium     Status: Abnormal   Collection Time: 07/21/20  4:30 AM  Result Value Ref Range   Magnesium 2.5 (H) 1.7 - 2.4 mg/dL    Comment: Performed at [facility], [address] [place], [place] [address]  Phosphorus     Status: Abnormal   Collection Time: 07/21/20   4:30 AM  Result Value Ref Range   Phosphorus 6.1 (H) 2.5 - 4.6 mg/dL    Comment: Performed at [facility], [address] [place], [place] [address]  Glucose, capillary     Status: None   Collection Time: 07/21/20  7:57 AM  Result Value Ref Range   Glucose-Capillary 85 70 - 99 mg/dL    Comment: Glucose reference range applies only to samples taken after fasting for at least 8 hours.  Glucose, capillary     Status: Abnormal   Collection Time: 07/21/20 11:17 AM  Result Value Ref Range   Glucose-Capillary 224 (H) 70 - 99 mg/dL    Comment: Glucose reference range applies only to samples taken after fasting for at least 8 hours.  Hepatitis panel, acute     Status: None   Collection Time: 07/21/20 11:37 AM  Result Value Ref Range   Hepatitis B Surface Ag NON REACTIVE NON REACTIVE   HCV Ab NON REACTIVE NON REACTIVE    Comment: (NOTE) Nonreactive HCV antibody screen is consistent with no HCV infections,  unless recent infection is suspected or other evidence exists to indicate HCV infection.     Hep A IgM NON REACTIVE NON REACTIVE   Hep B C IgM NON REACTIVE NON REACTIVE    Comment: Performed at [facility], [place] [address], [place], [name] [address]  Glucose, capillary     Status: Abnormal   Collection Time: 07/21/20  3:27 PM  Result Value Ref Range   Glucose-Capillary 208 (H) 70 - 99 mg/dL    Comment: Glucose reference range applies only to samples taken after fasting for at least 8 hours.  Glucose, capillary     Status: Abnormal   Collection Time: 07/21/20  8:25 PM  Result Value Ref Range   Glucose-Capillary 182 (H) 70 - 99 mg/dL    Comment: Glucose reference range applies only to samples taken after fasting for at least 8 hours.  Glucose, capillary     Status: None   Collection Time: 07/21/20 11:28 PM  Result Value Ref Range   Glucose-Capillary 81 70 - 99 mg/dL    Comment: Glucose reference range applies only to samples taken after fasting for at least 8 hours.  Glucose,  capillary     Status: None   Collection Time: 07/22/20  4:29 AM  Result Value Ref Range   Glucose-Capillary 91 70 - 99 mg/dL    Comment: Glucose reference range applies only to samples taken after fasting for at least 8 hours.   Comment 1 Notify RN    Comment 2 Document in Chart   Renal function panel     Status: Abnormal   Collection Time: 07/22/20  6:21 AM  Result Value Ref Range   Sodium 135 135 - 145 mmol/L   Potassium 4.6 3.5 - 5.1 mmol/L   Chloride 98 98 - 111 mmol/L   CO2 23 22 - 32  mmol/L   Glucose, Bld 100 (H) 70 - 99 mg/dL    Comment: Glucose reference range applies only to samples taken after fasting for at least 8 hours.   BUN 108 (H) 8 - 23 mg/dL    Comment: RESULTS CONFIRMED BY MANUAL DILUTION   Creatinine, Ser 4.59 (H) 0.61 - 1.24 mg/dL   Calcium 8.5 (L) 8.9 - 10.3 mg/dL   Phosphorus 5.8 (H) 2.5 - 4.6 mg/dL   Albumin 3.6 3.5 - 5.0 g/dL   GFR, Estimated 13 (L) >60 mL/min    Comment: (NOTE) Calculated using the CKD-EPI Creatinine Equation (2021)    Anion gap 14 5 - 15    Comment: Performed at [facility], [address], [place], [place] [address]  CBC with Differential/Platelet     Status: Abnormal   Collection Time: 07/22/20  6:21 AM  Result Value Ref Range   WBC 9.6 4.0 - 10.5 K/uL   RBC 3.75 (L) 4.22 - 5.81 MIL/uL   Hemoglobin 11.9 (L) 13.0 - 17.0 g/dL   HCT 36.3 (L) 39 - 52 %   MCV 96.8 80.0 - 100.0 fL   MCH 31.7 26.0 - 34.0 pg   MCHC 32.8 30.0 - 36.0 g/dL   RDW 14.2 11.5 - 15.5 %   Platelets 201 150 - 400 K/uL   nRBC 0.0 0.0 - 0.2 %   Neutrophils Relative % 78 %   Neutro Abs 7.5 1.7 - 7.7 K/uL   Lymphocytes Relative 10 %   Lymphs Abs 1.0 0.7 - 4.0 K/uL   Monocytes Relative 8 %   Monocytes Absolute 0.8 0.1 - 1.0 K/uL   Eosinophils Relative 3 %   Eosinophils Absolute 0.3 0.0 - 0.5 K/uL   Basophils Relative 1 %   Basophils Absolute 0.1 0.0 - 0.1 K/uL   Immature Granulocytes 0 %   Abs Immature Granulocytes 0.03 0.00 - 0.07 K/uL    Comment: Performed  at [facility], [address] [place], [place] [address]    No results found.  Assessment/Plan  1.  Myoclonus: Celexa has been stopped, electrolytes optimized.  Cr > 5 and BUN > 100.  Suspect that myoclonus d/t uremia and dialysis initiation advisable.  Discussed with pt- he is agreeable to start.  I expect we can discontinue Ativan after initiation of HD  2.  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 Theador Black.  No access.  Appreciate gen surg- [facility] placement likely Monday.  D/w primary team.  Will need CLIP.  Will need first HD orders written on Sunday for Monday AM.  3.  HTN: reasonably well-controlled    4.  BMD: Ca 8.5, will check Vit D and PTH.  On Sensipar.  P slightly elevated- watch  5.  Anemia: Hgb 11.9, will check iron panel, no ESA required at present  6.  Nutrition: renal diet with fluid restrictions  7.  Systolic CHF and diastolic CHF: EF 73% with G3DD on TTE 07/09/20.  Appears euvolemic at this time.    8.  Dispo: will need CLIP once HD initiated.     Madelon Lips 07/22/2020, 9:49 AM

## 2020-07-22 NOTE — Progress Notes (Signed)
PROGRESS NOTE    Bradley Black  [id number]:[id number] DOB: 1946-09-01 DOA: 07/20/2020 PCP: Susy Frizzle, MD  Brief Narrative:  74 year old white male Ischemic cardiomyopathy with systolic/diastolic parameters--History of CABG x4, left carotid artery stenosis--cardiac arrest with angioplasty 1997, stenting to RCA 99 and 2001-finally CABG X4 2013 Dr. 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 NSVT--ablation back in 06/2020 not indicated BPH  Recent multiple hospitalizations 10/8 through 07/13/2020 with chest pain combined heart failure exacerbation-went to [facility] for cardiac cath which showed LVEF 25% with severely reduced RV-at that admission creatinine trended upwards to 4.4 and was to be set up with nephrology in the outpatient setting  Readmitted 1018 through 1019 with myoclonic jerking secondary probably to Celexa in the setting of renal insufficiency-not discharged at that time creatinine was 4.1 he was readmitted because of repeated jerking  Emergency room work-up showed hemodynamically stable BUNs/creatinine 100/5.1 total bilirubin 1.7 potassium 5.2 Ativan was given to control the tremors Patient was sleepy on arrival  Assessment & Plan:   Principal Problem:   Tremors of nervous system Active Problems:   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 Myoclonic jerks with generalized weakness a. Myoclonus seems to be resolved-I have resumed temporarily very low-dose Ativan given con commitment anxiety b. Asterixis is probably from azotemia 2. Chronic kidney disease stage V a. Nephrology  consult appreciated planning for in-house initiation of dialysis b. Access to be placed by general surgery services appreciate input on Monday 10/25 3. Hypocalcemia hyponatremia hyperkalemia a. Labs improved b. -continuing Sensipar 30 daily, calcitriol 0.1 Monday Wednesday Friday 4. Elevated total bili a. Unclear etiology, patient may have nonalcoholic steatohepatitis b. Hepatitis panel is negative 5. Prolonged QTC 504 a. Keep on monitors and try to avoid QT prolonging agents 6. DM TY 2 A1c 5.7 a. CBGs well-controlled below 120, sliding scale b. Januvia held will resume on discharge 7. Reflux 8. History of CABG X4 a. Continue Imdur 30 daily Cardura 8 daily and hydralazine 75 3 times daily b. Adjust medications as needed c. For dialysis eventually 9. History of PVC NSVT a. Resumed amiodarone 200 daily for NSVT-monitor trends slightly bradycardic on monitors  DVT prophylaxis: On heparin Code Status: Full CODE STATUS Family Communication: none at the bedside Disposition: Inpatient  Status is: Inpatient  Remains inpatient appropriate because:Persistent severe electrolyte disturbances, Ongoing active pain requiring inpatient pain management, Altered mental status and IV treatments appropriate due to intensity of illness or inability to take PO   Dispo: The patient is from: Home              Anticipated d/c is to: Unclear at this time              Anticipated d/c date is: > 3 days              Patient currently is not medically stable to d/c.       Consultants:   Nephrology  Procedures: None  Antimicrobials: No   Subjective:  States that the shakes are back Wife is at the bedside  They both seem to be emotionally and mentally be struggling with his initiation of dialysis I spent some time talking about the technicalities dialysis and we tried to cheer him up   Objective: Vitals:   07/21/20 1600 07/21/20 1946 07/21/20 2325 07/22/20 0415  BP: 120/60 (!) 141/60  127/62 (!) 142/71  Pulse: (!) 59 60 61 65  Resp: 10 18 19 16   Temp:  97.8 F (36.6 C) 97.9 F (36.6 C) 98.3 F (36.8 C)  TempSrc:  Oral Oral Oral  SpO2: 99% 98% 98% 98%  Weight:      Height:        Intake/Output Summary (Last 24 hours) at 07/22/2020 1530 Last data filed at 07/22/2020 0100 Gross per 24 hour  Intake --  Output 500 ml  Net -500 ml   Filed Weights   07/20/20 1735 07/21/20 0200  Weight: 63.5 kg 61.4 kg    Examination:  General exam: Flat affect Respiratory system: Clinically clear no rales no rhonchi Cardiovascular system: S1-S2 no murmur no rub no gallop Gastrointestinal system: Soft nontender no rebound no guarding no hepatosplenomegaly  Neuro-slight tremor on holding her arms.   Data Reviewed: I have personally reviewed following labs and imaging studies Sodium 135 BUN/creatinine 108/4.5 WBC 9.6 Hemoglobin 12.4-->11.9   Radiology Studies: No results found.   Scheduled Meds: . amiodarone  200 mg Oral Daily  . aspirin EC  81 mg Oral Daily  . B-complex with vitamin C  1 tablet Oral Daily  . calcitRIOL  0.5 mcg Oral Q M,W,F  . Chlorhexidine Gluconate Cloth  6 each Topical Daily  . cinacalcet  30 mg Oral Q breakfast  . doxazosin  8 mg Oral Daily  . feeding supplement (NEPRO CARB STEADY)  237 mL Oral BID BM  . heparin  5,000 Units Subcutaneous Q8H  . hydrALAZINE  75 mg Oral TID  . insulin aspart  0-9 Units Subcutaneous Q4H  . isosorbide mononitrate  30 mg Oral Daily  . pantoprazole  40 mg Oral BID   Continuous Infusions:   LOS: 2 days    Time spent: 20  Nita Sells, MD [facility] To contact the attending provider between 7A-7P or the covering provider during after hours 7P-7A, please log into the web site [email or web address] and access using universal [place] password for that web site. If you do not have the password, please call the hospital operator.  07/22/2020, 3:30 PM

## 2020-07-22 NOTE — Progress Notes (Signed)
[facility]  Discussed with anesthesia. Should be able to do Permacath on Monday. Will be likely in afternoon based on OR availability.  Orders for OR need to be released Sunday. NPO midnight Sunday. CXR and BMP Monday AM ordered.  Will meet and discuss and consent for procedure Monday.  If needs something sooner, then will have to go to [facility] for permacath versus discussing temporary with Dr. Lysle Pearl over the weekend.  Curlene Labrum, MD [facility] [address] [place], [place] [address] [phone] (office)

## 2020-07-22 NOTE — Patient Outreach (Signed)
[facility]) Care Management  07/22/2020  [name] 03-May-1946 [id number]   [facility] care coordination for post hospital Transition of Care Referral- Hospitalized  Mr [name] was referred to [facility] on 07/14/20 by [place] hospital liaison Referral reason: Please refer to telephonic RN for post hospital follow up. Patient had been followed by [facility] prior to hospital admission.   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 Lavina Hamman, RN, BSN, [place] Coordinator Office number [phone] [facility] number [phone] Fax number [phone]

## 2020-07-23 DIAGNOSIS — R251 Tremor, unspecified: Secondary | ICD-10-CM | POA: Diagnosis not present

## 2020-07-23 LAB — GLUCOSE, CAPILLARY
Glucose-Capillary: 113 mg/dL — ABNORMAL HIGH (ref 70–99)
Glucose-Capillary: 92 mg/dL (ref 70–99)
Glucose-Capillary: 126 mg/dL — ABNORMAL HIGH (ref 70–99)
Glucose-Capillary: 129 mg/dL — ABNORMAL HIGH (ref 70–99)
Glucose-Capillary: 125 mg/dL — ABNORMAL HIGH (ref 70–99)

## 2020-07-23 LAB — IRON AND TIBC
Iron: 91 ug/dL (ref 45–182)
Saturation Ratios: 35 % (ref 17.9–39.5)
TIBC: 262 ug/dL (ref 250–450)
UIBC: 171 ug/dL

## 2020-07-23 LAB — VITAMIN D 25 HYDROXY (VIT D DEFICIENCY, FRACTURES): Vit D, 25-Hydroxy: 39.14 ng/mL (ref 30–100)

## 2020-07-23 LAB — FERRITIN: Ferritin: 307 ng/mL (ref 24–336)

## 2020-07-23 NOTE — Progress Notes (Signed)
PROGRESS NOTE    Bradley Black  [id number]:[id number] DOB: 1946/02/26 DOA: 07/20/2020 PCP: Susy Frizzle, MD  Brief Narrative:   74 year old white male Ischemic cardiomyopathy with systolic/diastolic parameters--History of CABG x4, left carotid artery stenosis--cardiac arrest with angioplasty 1997, stenting to RCA 99 and 2001-finally CABG X4 2013 Dr. 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 NSVT--ablation back in 06/2020 not indicated BPH  Recent multiple hospitalizations 10/8 through 07/13/2020 with chest pain combined heart failure exacerbation-went to [facility] for cardiac cath which showed LVEF 25% with severely reduced RV-at that admission creatinine trended upwards to 4.4 and was to be set up with nephrology in the outpatient setting  Readmitted 1018 through 1019 with myoclonic jerking secondary probably to Celexa in the setting of renal insufficiency-not discharged at that time creatinine was 4.1 he was readmitted because of repeated jerking  Emergency room work-up showed hemodynamically stable BUNs/creatinine 100/5.1 total bilirubin 1.7 potassium 5.2 Ativan was given to control the tremors Patient was sleepy on arrival  He has improved to some degree in the hospital, but will need initiation of dialysis this admit  Will have tem access placed thid admit on 10/25  Assessment & Plan:   Principal Problem:   Tremors of nervous system Active Problems:   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 Myoclonic jerks with generalized weakness a. Myoclonus seems to be resolved-I have resumed  temporarily very low-dose Ativan given con commitment anxiety and he is improved b. Asterixis is probably from azotemia and is improved 2. Chronic kidney disease stage V a. Nephrology consult appreciated planning for in-house initiation of dialysis b. Access to be placed by general surgery services appreciate input on Monday 10/25 3. Hypocalcemia hyponatremia hyperkalemia a. Labs improved b. -continuing Sensipar 30 daily, calcitriol 0.1 Monday Wednesday Friday 4. Elevated total bili a. Unclear etiology, patient may have nonalcoholic steatohepatitis b. Hepatitis panel is negative c. Rpt CMET in am 5. Prolonged QTC 504 a. Keep on monitors and try to avoid QT prolonging agents 6. DM TY 2 A1c 5.7 a. CBGs well-controlled below 150, sliding scale b. Januvia held will resume on discharge 7. Reflux 8. History of CABG X4 a. Continue Imdur 30 daily Cardura 8 daily and hydralazine 75 3 times daily b. Adjust medications as needed c. For dialysis eventually 9. History of PVC NSVT a. Resumed amiodarone 200 daily for NSVT-monitor trends slightly bradycardic on monitors  DVT prophylaxis: On heparin Code Status: Full CODE STATUS Family Communication: none at the bedside Disposition: Inpatient  Status is: Inpatient  Remains inpatient appropriate because:Persistent severe electrolyte disturbances, Ongoing active pain requiring inpatient pain management, Altered mental status and IV treatments appropriate due to intensity of illness or inability to take PO   Dispo: The patient is from: Home              Anticipated d/c is to: Unclear at this time              Anticipated d/c date is: > 3 days  Patient currently is not medically stable to d/c.       Consultants:   Nephrology  Procedures: None  Antimicrobials: No   Subjective:  Awake coherent in nad no focal deficit eating drinking some  No stool in several days but thinks he can go A little bit more  interactive  Objective: Vitals:   07/21/20 2325 07/22/20 0415 07/22/20 1656 07/22/20 2116  BP: 127/62 (!) 142/71 139/65 136/61  Pulse: 61 65 60 (!) 58  Resp: 19 16 14 17   Temp: 97.9 F (36.6 C) 98.3 F (36.8 C) 97.6 F (36.4 C) 97.8 F (36.6 C)  TempSrc: Oral Oral Oral Oral  SpO2: 98% 98% 99% 97%  Weight:      Height:        Intake/Output Summary (Last 24 hours) at 07/23/2020 1122 Last data filed at 07/23/2020 0900 Gross per 24 hour  Intake 240 ml  Output 600 ml  Net -360 ml   Filed Weights   07/20/20 1735 07/21/20 0200  Weight: 63.5 kg 61.4 kg    Examination:  General exam: Flat affect but slight improvement Respiratory system: Clinically clear no rales no rhonchi, clear Cardiovascular system: S1-S2 no murmur no rub no gallop Gastrointestinal system: Soft nontender no rebound no guarding no hepatosplenomegaly  Neuro-slight tremor on holding her arms.   Data Reviewed: I have personally reviewed following labs and imaging studies  No labs today  Radiology Studies: No results found.   Scheduled Meds: . amiodarone  200 mg Oral Daily  . aspirin EC  81 mg Oral Daily  . B-complex with vitamin C  1 tablet Oral Daily  . calcitRIOL  0.5 mcg Oral Q M,W,F  . Chlorhexidine Gluconate Cloth  6 each Topical Daily  . cinacalcet  30 mg Oral Q breakfast  . doxazosin  8 mg Oral Daily  . feeding supplement (NEPRO CARB STEADY)  237 mL Oral BID BM  . heparin  5,000 Units Subcutaneous Q8H  . hydrALAZINE  75 mg Oral TID  . insulin aspart  0-9 Units Subcutaneous Q4H  . isosorbide mononitrate  30 mg Oral Daily  . pantoprazole  40 mg Oral BID   Continuous Infusions:   LOS: 3 days    Time spent: 20  Nita Sells, MD [facility] To contact the attending provider between 7A-7P or the covering provider during after hours 7P-7A, please log into the web site [email or web address] and access using universal [place] password for that web site. If you do not have the  password, please call the hospital operator.  07/23/2020, 11:22 AM

## 2020-07-24 DIAGNOSIS — R251 Tremor, unspecified: Secondary | ICD-10-CM | POA: Diagnosis not present

## 2020-07-24 LAB — HEPATITIS B SURFACE ANTIGEN: Hepatitis B Surface Ag: NONREACTIVE

## 2020-07-24 LAB — GLUCOSE, CAPILLARY
Glucose-Capillary: 120 mg/dL — ABNORMAL HIGH (ref 70–99)
Glucose-Capillary: 105 mg/dL — ABNORMAL HIGH (ref 70–99)
Glucose-Capillary: 90 mg/dL (ref 70–99)
Glucose-Capillary: 158 mg/dL — ABNORMAL HIGH (ref 70–99)
Glucose-Capillary: 134 mg/dL — ABNORMAL HIGH (ref 70–99)
Glucose-Capillary: 160 mg/dL — ABNORMAL HIGH (ref 70–99)
Glucose-Capillary: 155 mg/dL — ABNORMAL HIGH (ref 70–99)

## 2020-07-24 LAB — RENAL FUNCTION PANEL
Albumin: 3.7 g/dL (ref 3.5–5.0)
Anion gap: 11 (ref 5–15)
BUN: 95 mg/dL — ABNORMAL HIGH (ref 8–23)
CO2: 21 mmol/L — ABNORMAL LOW (ref 22–32)
Calcium: 8.9 mg/dL (ref 8.9–10.3)
Chloride: 101 mmol/L (ref 98–111)
Creatinine, Ser: 4.22 mg/dL — ABNORMAL HIGH (ref 0.61–1.24)
GFR, Estimated: 14 mL/min — ABNORMAL LOW (ref >60)
Glucose, Bld: 103 mg/dL — ABNORMAL HIGH (ref 70–99)
Phosphorus: 4.7 mg/dL — ABNORMAL HIGH (ref 2.5–4.6)
Potassium: 4.3 mmol/L (ref 3.5–5.1)
Sodium: 133 mmol/L — ABNORMAL LOW (ref 135–145)

## 2020-07-24 NOTE — Progress Notes (Signed)
PROGRESS NOTE    Bradley Black  [id number]:[id number] DOB: 1946-03-25 DOA: 07/20/2020 PCP: Susy Frizzle, MD  Brief Narrative:   74 year old white male Ischemic cardiomyopathy with systolic/diastolic parameters--History of CABG x4, left carotid artery stenosis--cardiac arrest with angioplasty 1997, stenting to RCA 99 and 2001-finally CABG X4 2013 Dr. 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 NSVT--ablation back in 06/2020 not indicated BPH  Recent multiple hospitalizations 10/8 through 07/13/2020 with chest pain combined heart failure exacerbation-went to [facility] for cardiac cath which showed LVEF 25% with severely reduced RV-at that admission creatinine trended upwards to 4.4 and was to be set up with nephrology in the outpatient setting  Readmitted 1018 through 1019 with myoclonic jerking secondary probably to Celexa in the setting of renal insufficiency-not discharged at that time creatinine was 4.1 he was readmitted because of repeated jerking  Emergency room work-up showed hemodynamically stable BUNs/creatinine 100/5.1 total bilirubin 1.7 potassium 5.2 Ativan was given to control the tremors Patient was sleepy on arrival  He has improved to some degree in the hospital, but will need initiation of dialysis this admit  Will have tem access placed thid admit on 10/25  Assessment & Plan:   Principal Problem:   Tremors of nervous system Active Problems:   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 Myoclonic jerks with generalized weakness a. Myoclonus resolved-give low-dose Ativan 2/2  anxiety /jerks b. Asterixis is probably from azotemia and is improved 2. Chronic kidney disease stage V a. Nephrology consult appreciated planning for in-house initiation of dialysis b. Dialysis cath Monday 10/25--NPO after midnight and labs ordered for am 3. Hypocalcemia hyponatremia hyperkalemia a. Labs improved b. continuing Sensipar 30 daily, calcitriol 0.1 Monday Wednesday Friday 4. Elevated total bili a. Unclear etiology, patient may have nonalcoholic steatohepatitis? Improved gradually over hosp stay b. Hepatitis panel is negative c. Rpt LFT in OP and work-up if approp 5. Prolonged QTC 504 a. Keep on monitors and try to avoid QT prolonging agents 6. DM TY 2 A1c 5.7 a. CBGs well-controlled below 150, sliding scale d/c post procedure b. Resume Januvia post- procedure 7. Reflux 8. History of CABG X4 a. Continue Imdur 30 daily Cardura 8 daily and hydralazine 75 3 times daily b. Some uncontrolled today c. For dialysis eventually-may help with elevate dBP 9. History of PVC NSVT a. Resumed amiodarone 200 daily for NSVT-monitor trends slightly bradycardic on monitors b. Would d/c monitors as this is chronic  DVT prophylaxis: On heparin Code Status: Full CODE STATUS Family Communication: d/w daughter at bedside Disposition: Inpatient  Status is: Inpatient  Remains inpatient appropriate because:Persistent severe electrolyte disturbances, Ongoing active pain requiring inpatient pain management, Altered mental status and IV treatments appropriate due to intensity of illness or inability to take PO   Dispo: The patient is from: Home              Anticipated d/c is to: Unclear at this time              Anticipated d/c date is: > 3 days  Patient currently is not medically stable to d/c.       Consultants:   Nephrology  Procedures: None  Antimicrobials: No   Subjective:  Well less depressed No pain Eating and drinking  Has been oob as  well  Objective: Vitals:   07/23/20 1321 07/23/20 2019 07/24/20 0445 07/24/20 0446  BP: 125/61 (!) 161/74 (!) 183/60   Pulse: 61 60 66 66  Resp: 19 17 18    Temp: 98.1 F (36.7 C) 97.7 F (36.5 C) 98.4 F (36.9 C)   TempSrc: Oral Oral    SpO2: 96% 95% 98% 98%  Weight:      Height:        Intake/Output Summary (Last 24 hours) at 07/24/2020 7322 Last data filed at 07/24/2020 0200 Gross per 24 hour  Intake 720 ml  Output 900 ml  Net -180 ml   Filed Weights   07/20/20 1735 07/21/20 0200  Weight: 63.5 kg 61.4 kg    Examination:  eomi ncat  cta  No added sound Chest clear no added sound No le edema Flat affect   Data Reviewed: I have personally reviewed following labs and imaging studies  Sodium 133 Bun /creat 95/4.22 co2 21 Phos 4.7   Radiology Studies: No results found.   Scheduled Meds: . amiodarone  200 mg Oral Daily  . aspirin EC  81 mg Oral Daily  . B-complex with vitamin C  1 tablet Oral Daily  . calcitRIOL  0.5 mcg Oral Q M,W,F  . Chlorhexidine Gluconate Cloth  6 each Topical Daily  . cinacalcet  30 mg Oral Q breakfast  . doxazosin  8 mg Oral Daily  . feeding supplement (NEPRO CARB STEADY)  237 mL Oral BID BM  . heparin  5,000 Units Subcutaneous Q8H  . hydrALAZINE  75 mg Oral TID  . insulin aspart  0-9 Units Subcutaneous Q4H  . isosorbide mononitrate  30 mg Oral Daily  . pantoprazole  40 mg Oral BID   Continuous Infusions:   LOS: 4 days    Time spent: 20  Nita Sells, MD [facility] To contact the attending provider between 7A-7P or the covering provider during after hours 7P-7A, please log into the web site [email or web address] and access using universal  password for that web site. If you do not have the password, please call the hospital operator.  07/24/2020, 8:14 AM

## 2020-07-25 ENCOUNTER — Inpatient Hospital Stay (HOSPITAL_COMMUNITY): Payer: Medicare Other | Admitting: Anesthesiology

## 2020-07-25 ENCOUNTER — Other Ambulatory Visit: Payer: Self-pay

## 2020-07-25 ENCOUNTER — Inpatient Hospital Stay (HOSPITAL_COMMUNITY): Payer: Medicare Other

## 2020-07-25 ENCOUNTER — Encounter (HOSPITAL_COMMUNITY)
Admission: EM | Disposition: A | Discharge status: Home Health | Payer: Self-pay | Source: Home / Self Care | Attending: Family Medicine

## 2020-07-25 ENCOUNTER — Encounter (HOSPITAL_COMMUNITY): Payer: Self-pay | Admitting: Internal Medicine

## 2020-07-25 DIAGNOSIS — E782 Mixed hyperlipidemia: Secondary | ICD-10-CM | POA: Diagnosis not present

## 2020-07-25 DIAGNOSIS — N186 End stage renal disease: Secondary | ICD-10-CM | POA: Diagnosis not present

## 2020-07-25 DIAGNOSIS — N185 Chronic kidney disease, stage 5: Secondary | ICD-10-CM

## 2020-07-25 DIAGNOSIS — Z992 Dependence on renal dialysis: Secondary | ICD-10-CM

## 2020-07-25 DIAGNOSIS — R251 Tremor, unspecified: Secondary | ICD-10-CM | POA: Diagnosis not present

## 2020-07-25 DIAGNOSIS — I120 Hypertensive chronic kidney disease with stage 5 chronic kidney disease or end stage renal disease: Secondary | ICD-10-CM | POA: Diagnosis not present

## 2020-07-25 DIAGNOSIS — Z951 Presence of aortocoronary bypass graft: Secondary | ICD-10-CM | POA: Diagnosis not present

## 2020-07-25 DIAGNOSIS — E1122 Type 2 diabetes mellitus with diabetic chronic kidney disease: Secondary | ICD-10-CM | POA: Diagnosis not present

## 2020-07-25 HISTORY — PX: INSERTION OF DIALYSIS CATHETER: SHX1324

## 2020-07-25 LAB — GLUCOSE, CAPILLARY
Glucose-Capillary: 101 mg/dL — ABNORMAL HIGH (ref 70–99)
Glucose-Capillary: 149 mg/dL — ABNORMAL HIGH (ref 70–99)
Glucose-Capillary: 152 mg/dL — ABNORMAL HIGH (ref 70–99)
Glucose-Capillary: 155 mg/dL — ABNORMAL HIGH (ref 70–99)
Glucose-Capillary: 159 mg/dL — ABNORMAL HIGH (ref 70–99)
Glucose-Capillary: 167 mg/dL — ABNORMAL HIGH (ref 70–99)
Glucose-Capillary: 90 mg/dL (ref 70–99)

## 2020-07-25 LAB — RENAL FUNCTION PANEL
Albumin: 3.6 g/dL (ref 3.5–5.0)
Anion gap: 12 (ref 5–15)
BUN: 89 mg/dL — ABNORMAL HIGH (ref 8–23)
CO2: 20 mmol/L — ABNORMAL LOW (ref 22–32)
Calcium: 9 mg/dL (ref 8.9–10.3)
Chloride: 103 mmol/L (ref 98–111)
Creatinine, Ser: 3.97 mg/dL — ABNORMAL HIGH (ref 0.61–1.24)
GFR, Estimated: 15 mL/min — ABNORMAL LOW (ref >60)
Glucose, Bld: 158 mg/dL — ABNORMAL HIGH (ref 70–99)
Phosphorus: 4.6 mg/dL (ref 2.5–4.6)
Potassium: 4.8 mmol/L (ref 3.5–5.1)
Sodium: 135 mmol/L (ref 135–145)

## 2020-07-25 LAB — BASIC METABOLIC PANEL
Anion gap: 11 (ref 5–15)
BUN: 95 mg/dL — ABNORMAL HIGH (ref 8–23)
CO2: 23 mmol/L (ref 22–32)
Calcium: 9.1 mg/dL (ref 8.9–10.3)
Chloride: 101 mmol/L (ref 98–111)
Creatinine, Ser: 4.01 mg/dL — ABNORMAL HIGH (ref 0.61–1.24)
GFR, Estimated: 15 mL/min — ABNORMAL LOW (ref >60)
Glucose, Bld: 102 mg/dL — ABNORMAL HIGH (ref 70–99)
Potassium: 4.4 mmol/L (ref 3.5–5.1)
Sodium: 135 mmol/L (ref 135–145)

## 2020-07-25 LAB — CBC WITH DIFFERENTIAL/PLATELET
Abs Immature Granulocytes: 0.04 10*3/uL (ref 0.00–0.07)
Basophils Absolute: 0 10*3/uL (ref 0.0–0.1)
Basophils Relative: 0 %
Eosinophils Absolute: 0.2 10*3/uL (ref 0.0–0.5)
Eosinophils Relative: 2 %
HCT: 35.6 % — ABNORMAL LOW (ref 39.0–52.0)
Hemoglobin: 11.4 g/dL — ABNORMAL LOW (ref 13.0–17.0)
Immature Granulocytes: 1 %
Lymphocytes Relative: 12 %
Lymphs Abs: 1 10*3/uL (ref 0.7–4.0)
MCH: 31 pg (ref 26.0–34.0)
MCHC: 32 g/dL (ref 30.0–36.0)
MCV: 96.7 fL (ref 80.0–100.0)
Monocytes Absolute: 0.7 10*3/uL (ref 0.1–1.0)
Monocytes Relative: 9 %
Neutro Abs: 6.2 10*3/uL (ref 1.7–7.7)
Neutrophils Relative %: 76 %
Platelets: 193 10*3/uL (ref 150–400)
RBC: 3.68 MIL/uL — ABNORMAL LOW (ref 4.22–5.81)
RDW: 13.8 % (ref 11.5–15.5)
WBC: 8.2 10*3/uL (ref 4.0–10.5)
nRBC: 0 % (ref 0.0–0.2)

## 2020-07-25 LAB — CBC
HCT: 36 % — ABNORMAL LOW (ref 39.0–52.0)
Hemoglobin: 11.7 g/dL — ABNORMAL LOW (ref 13.0–17.0)
MCH: 31.8 pg (ref 26.0–34.0)
MCHC: 32.5 g/dL (ref 30.0–36.0)
MCV: 97.8 fL (ref 80.0–100.0)
Platelets: 174 10*3/uL (ref 150–400)
RBC: 3.68 MIL/uL — ABNORMAL LOW (ref 4.22–5.81)
RDW: 13.8 % (ref 11.5–15.5)
WBC: 7.6 10*3/uL (ref 4.0–10.5)
nRBC: 0 % (ref 0.0–0.2)

## 2020-07-25 LAB — SURGICAL PCR SCREEN
MRSA, PCR: NEGATIVE
Staphylococcus aureus: NEGATIVE

## 2020-07-25 LAB — PARATHYROID HORMONE, INTACT (NO CA): PTH: 48 pg/mL (ref 15–65)

## 2020-07-25 SURGERY — INSERTION OF DIALYSIS CATHETER
Anesthesia: General | Site: Neck | Laterality: Right

## 2020-07-25 MED ORDER — LIDOCAINE-PRILOCAINE 2.5-2.5 % EX CREA
1.0000 "application " | TOPICAL_CREAM | CUTANEOUS | Status: DC | PRN
  Filled 2020-07-25: qty 5

## 2020-07-25 MED ORDER — SODIUM CHLORIDE 0.9 % IV SOLN: 100.0000 mL | INTRAVENOUS | Status: DC | PRN

## 2020-07-25 MED ORDER — CEFAZOLIN SODIUM-DEXTROSE 2-4 GM/100ML-% IV SOLN: 2.0000 g | INTRAVENOUS | Status: DC

## 2020-07-25 MED ORDER — SODIUM CHLORIDE (PF) 0.9 % IJ SOLN
INTRAMUSCULAR | Status: DC | PRN
  Administered 2020-07-25: 500 mL

## 2020-07-25 MED ORDER — CHLORHEXIDINE GLUCONATE CLOTH 2 % EX PADS
6.0000 | MEDICATED_PAD | Freq: Once | CUTANEOUS | Status: AC
  Administered 2020-07-25: 6 via TOPICAL

## 2020-07-25 MED ORDER — HEPARIN SODIUM (PORCINE) 1000 UNIT/ML DIALYSIS
1000.0000 [IU] | INTRAMUSCULAR | Status: DC | PRN
  Filled 2020-07-25: qty 1

## 2020-07-25 MED ORDER — ORAL CARE MOUTH RINSE: 15.0000 mL | Freq: Once | OROMUCOSAL | Status: AC

## 2020-07-25 MED ORDER — CHLORHEXIDINE GLUCONATE 0.12 % MT SOLN
OROMUCOSAL | Status: AC
  Filled 2020-07-25: qty 15

## 2020-07-25 MED ORDER — LIDOCAINE HCL (PF) 1 % IJ SOLN
INTRAMUSCULAR | Status: DC | PRN
  Administered 2020-07-25: 11 mL via INTRADERMAL

## 2020-07-25 MED ORDER — CHLORHEXIDINE GLUCONATE CLOTH 2 % EX PADS: 6.0000 | MEDICATED_PAD | Freq: Once | CUTANEOUS | Status: DC

## 2020-07-25 MED ORDER — LINAGLIPTIN 5 MG PO TABS
5.0000 mg | ORAL_TABLET | Freq: Every day | ORAL | Status: DC
  Administered 2020-07-25 – 2020-08-02 (×8): 5 mg via ORAL
  Filled 2020-07-25 (×8): qty 1

## 2020-07-25 MED ORDER — CHLORHEXIDINE GLUCONATE 0.12 % MT SOLN
15.0000 mL | Freq: Once | OROMUCOSAL | Status: AC
  Administered 2020-07-25: 15 mL via OROMUCOSAL

## 2020-07-25 MED ORDER — SODIUM CHLORIDE 0.9 % IV SOLN: INTRAVENOUS | Status: DC

## 2020-07-25 MED ORDER — HYDROMORPHONE HCL 1 MG/ML IJ SOLN: 0.2500 mg | INTRAMUSCULAR | Status: DC | PRN

## 2020-07-25 MED ORDER — CEFAZOLIN SODIUM-DEXTROSE 2-4 GM/100ML-% IV SOLN
INTRAVENOUS | Status: AC
  Filled 2020-07-25: qty 100

## 2020-07-25 MED ORDER — LIDOCAINE HCL (CARDIAC) PF 100 MG/5ML IV SOSY
PREFILLED_SYRINGE | INTRAVENOUS | Status: DC | PRN
  Administered 2020-07-25: 40 mg via INTRATRACHEAL

## 2020-07-25 MED ORDER — CEFAZOLIN SODIUM-DEXTROSE 2-4 GM/100ML-% IV SOLN
2.0000 g | INTRAVENOUS | Status: AC
  Administered 2020-07-25: 2 g via INTRAVENOUS

## 2020-07-25 MED ORDER — MIDAZOLAM HCL 2 MG/2ML IJ SOLN
INTRAMUSCULAR | Status: AC
  Filled 2020-07-25: qty 2

## 2020-07-25 MED ORDER — ALTEPLASE 2 MG IJ SOLR
2.0000 mg | Freq: Once | INTRAMUSCULAR | Status: DC | PRN
  Filled 2020-07-25: qty 2

## 2020-07-25 MED ORDER — LIDOCAINE HCL (PF) 1 % IJ SOLN
INTRAMUSCULAR | Status: AC
  Filled 2020-07-25: qty 30

## 2020-07-25 MED ORDER — HEPARIN 1000 UNIT/ML FOR PERITONEAL DIALYSIS
INTRAMUSCULAR | Status: DC | PRN
  Administered 2020-07-25: 4000 [IU]

## 2020-07-25 MED ORDER — LIDOCAINE HCL (PF) 1 % IJ SOLN: 5.0000 mL | INTRAMUSCULAR | Status: DC | PRN

## 2020-07-25 MED ORDER — PENTAFLUOROPROP-TETRAFLUOROETH EX AERO
1.0000 "application " | INHALATION_SPRAY | CUTANEOUS | Status: DC | PRN
  Filled 2020-07-25: qty 30

## 2020-07-25 MED ORDER — CHLORHEXIDINE GLUCONATE 0.12 % MT SOLN
15.0000 mL | Freq: Once | OROMUCOSAL | Status: DC
  Filled 2020-07-25: qty 15

## 2020-07-25 MED ORDER — HYDROCODONE-ACETAMINOPHEN 5-325 MG PO TABS
1.0000 | ORAL_TABLET | ORAL | Status: DC | PRN
  Administered 2020-07-29: 1 via ORAL
  Filled 2020-07-25: qty 1

## 2020-07-25 MED ORDER — LIDOCAINE 2% (20 MG/ML) 5 ML SYRINGE
INTRAMUSCULAR | Status: AC
  Filled 2020-07-25: qty 5

## 2020-07-25 MED ORDER — HEPARIN SODIUM (PORCINE) 1000 UNIT/ML IJ SOLN
INTRAMUSCULAR | Status: AC
  Filled 2020-07-25: qty 4

## 2020-07-25 MED ORDER — LIDOCAINE HCL (PF) 2 % IJ SOLN
INTRAMUSCULAR | Status: AC
  Filled 2020-07-25: qty 10

## 2020-07-25 MED ORDER — CHLORHEXIDINE GLUCONATE CLOTH 2 % EX PADS: 6.0000 | MEDICATED_PAD | Freq: Once | CUTANEOUS | Status: AC

## 2020-07-25 MED ORDER — MIDAZOLAM HCL 5 MG/5ML IJ SOLN
INTRAMUSCULAR | Status: DC | PRN
  Administered 2020-07-25 (×2): 1 mg via INTRAVENOUS

## 2020-07-25 MED ORDER — PROPOFOL 500 MG/50ML IV EMUL
INTRAVENOUS | Status: DC | PRN
  Administered 2020-07-25: 125 ug/kg/min via INTRAVENOUS

## 2020-07-25 MED ORDER — PROPOFOL 10 MG/ML IV BOLUS
INTRAVENOUS | Status: AC
  Filled 2020-07-25: qty 20

## 2020-07-25 MED ORDER — ORAL CARE MOUTH RINSE: 15.0000 mL | Freq: Once | OROMUCOSAL | Status: DC

## 2020-07-25 MED ORDER — FENTANYL CITRATE (PF) 250 MCG/5ML IJ SOLN
INTRAMUSCULAR | Status: AC
  Filled 2020-07-25: qty 5

## 2020-07-25 MED ORDER — HEPARIN SODIUM (PORCINE) 1000 UNIT/ML DIALYSIS
3800.0000 [IU] | INTRAMUSCULAR | Status: DC | PRN
  Administered 2020-07-26 – 2020-07-28 (×2): 3800 [IU] via INTRAVENOUS_CENTRAL
  Filled 2020-07-25 (×3): qty 4

## 2020-07-25 SURGICAL SUPPLY — 50 items
ADH SKN CLS APL DERMABOND .7 (GAUZE/BANDAGES/DRESSINGS) ×1
APL PRP STRL LF ISPRP CHG 10.5 (MISCELLANEOUS) ×1
APPLICATOR CHLORAPREP 10.5 ORG (MISCELLANEOUS) ×2 IMPLANT
BAG DECANTER FOR FLEXI CONT (MISCELLANEOUS) ×2 IMPLANT
BIOPATCH RED 1 DISK 7.0 (GAUZE/BANDAGES/DRESSINGS) ×2 IMPLANT
CATH PALINDROME-P 19CM W/VT (CATHETERS) IMPLANT
CATH PALINDROME-P 23CM W/VT (CATHETERS) ×1 IMPLANT
COVER LIGHT HANDLE STERIS (MISCELLANEOUS) ×4 IMPLANT
COVER PROBE U/S 5X48 (MISCELLANEOUS) ×2 IMPLANT
COVER WAND RF STERILE (DRAPES) ×2 IMPLANT
DECANTER SPIKE VIAL GLASS SM (MISCELLANEOUS) ×3 IMPLANT
DERMABOND ADVANCED (GAUZE/BANDAGES/DRESSINGS) ×1
DERMABOND ADVANCED .7 DNX12 (GAUZE/BANDAGES/DRESSINGS) ×1 IMPLANT
DRAPE C-ARM FOLDED MOBILE STRL (DRAPES) ×2 IMPLANT
DRAPE CHEST BREAST 15X10 FENES (DRAPES) ×2 IMPLANT
DRSG SORBAVIEW 3.5X5-5/16 MED (GAUZE/BANDAGES/DRESSINGS) ×2 IMPLANT
ELECT REM PT RETURN 9FT ADLT (ELECTROSURGICAL) ×2
ELECTRODE REM PT RTRN 9FT ADLT (ELECTROSURGICAL) ×1 IMPLANT
GAUZE 4X4 16PLY RFD (DISPOSABLE) ×2 IMPLANT
GEL ULTRASOUND 20GR AQUASONIC (MISCELLANEOUS) ×2 IMPLANT
GLOVE BIO SURGEON STRL SZ 6.5 (GLOVE) ×2 IMPLANT
GLOVE BIOGEL PI IND STRL 6.5 (GLOVE) ×1 IMPLANT
GLOVE BIOGEL PI IND STRL 7.0 (GLOVE) ×2 IMPLANT
GLOVE BIOGEL PI INDICATOR 6.5 (GLOVE) ×1
GLOVE BIOGEL PI INDICATOR 7.0 (GLOVE) ×2
GOWN STRL REUS W/TWL LRG LVL3 (GOWN DISPOSABLE) ×4 IMPLANT
IV CONNECTOR ONE LINK NDLESS (IV SETS) IMPLANT
IV NS 500ML (IV SOLUTION) ×2
IV NS 500ML BAXH (IV SOLUTION) ×1 IMPLANT
KIT BLADEGUARD II DBL (SET/KITS/TRAYS/PACK) ×2 IMPLANT
KIT PALINDROME-P 55CM (CATHETERS) IMPLANT
KIT TURNOVER KIT A (KITS) ×2 IMPLANT
MARKER SKIN DUAL TIP RULER LAB (MISCELLANEOUS) ×2 IMPLANT
NDL HYPO 18GX1.5 BLUNT FILL (NEEDLE) ×1 IMPLANT
NDL HYPO 25X1 1.5 SAFETY (NEEDLE) ×1 IMPLANT
NEEDLE HYPO 18GX1.5 BLUNT FILL (NEEDLE) ×2 IMPLANT
NEEDLE HYPO 25X1 1.5 SAFETY (NEEDLE) ×2 IMPLANT
PACK BASIC III (CUSTOM PROCEDURE TRAY) ×2
PACK SRG BSC III STRL LF ECLPS (CUSTOM PROCEDURE TRAY) ×1 IMPLANT
PAD ARMBOARD 7.5X6 YLW CONV (MISCELLANEOUS) ×2 IMPLANT
PENCIL SMOKE EVACUATOR COATED (MISCELLANEOUS) ×2 IMPLANT
SET BASIN LINEN APH (SET/KITS/TRAYS/PACK) ×2 IMPLANT
SUT MNCRL AB 4-0 PS2 18 (SUTURE) ×2 IMPLANT
SUT SILK 2 0 FSL 18 (SUTURE) ×2 IMPLANT
SUT VIC AB 3-0 SH 27 (SUTURE) ×2
SUT VIC AB 3-0 SH 27X BRD (SUTURE) ×1 IMPLANT
SYR 10ML LL (SYRINGE) ×2 IMPLANT
SYR 20ML LL LF (SYRINGE) ×2 IMPLANT
SYR 5ML LL (SYRINGE) ×3 IMPLANT
SYR CONTROL 10ML LL (SYRINGE) ×2 IMPLANT

## 2020-07-25 NOTE — Op Note (Signed)
Operative Note 07/25/20   Preoperative Diagnosis: End Stage Renal Disease    Postoperative Diagnosis: Same   Procedure(s) Performed: Tunneled Dialysis Catheter Placement, Right Internal Jugular    Surgeon: Lanell Matar. Constance Haw, MD   Assistants: No qualified resident was available   Anesthesia: Monitored anesthesia care   Anesthesiologist: No responsible provider has been recorded for the case.    Specimens: None   Estimated Blood Loss: Minimal   Fluoroscopy time: 8 seconds   Blood Replacement: None    Complications: None    Operative Findings: Normal anatomy   Indications:  Bradley Black is a 74 yo with worsening renal function who will need to start dialysis. Discussed the risk of tunneled access placement including but not limited to bleeding, injury to vessels, pneumothorax, infection, and he opted to proceed.   Procedure: The patient was brought into the operating room and monitored anesthesia care was induced.   The right chest and neck was prepped and draped in the usual sterile fashion.  Preoperative antibiotics were given.   An Ultrasound was used to verify that the right internal jugular vein was patent.  One percent lidocaine was used for local anesthesia.  The patient was measured and a 23 cm Palindrome dual lumen dialysis catheter.  The needles advanced into the right internal jugular vein using the Seldinger technique without difficulty.  A guidewire was then advanced into the right atrium under fluoroscopic guidance.  Ectopia was not noted.  The wire was secured.  An incision was made over the right chest and the catheter was tunneled to the neck.  The ultrasound again confirmed the wire was going into the vein only. Dilators were used over the wire to dilate the track.  An introducer and peel-away sheath were placed over the guidewire. The catheter was then inserted through the peel-away sheath and the peel-away sheath was removed.  A spot film was performed to confirm the  position.  The catheter drew back and flushed easily. The lumens were packed with heparin. Hemostats were used to position the catheter in the neck incision. The neck incision was closed with 4-0 Monocryl and Dermabond. The catheter was secured with 2-0 silk suture and a sterile Biopatch and dressing was applied.  Hemostasis was confirmed.     All tape and needle counts were correct at the end of the procedure. The patient was transferred to PACU in stable condition. A chest x-ray will be performed at that time.  Bradley Labrum, MD [facility] [address] [place], [place] [address] [phone] (office)

## 2020-07-25 NOTE — Consult Note (Signed)
[facility] Consult  Reason for Consult: Dialysis access  Referring Physician:  Dr. Verlon Au, MD and Nephrology   Chief Complaint    Shaking      HPI: Bradley Black is a 74 y.o. male with ESRD, myoclonus,  CAD, CHF with EF 25%, prior cardiac arrest with angioplasty, DM who is going to require dialysis now. In the past he has not wanted to start dialysis but understands and wants to start his treatment now. He has never had any catheters.   He denies any current SOB or edema. He is worried about not being able to eat chocolate which is what the nutrition recommended.   Past Medical History:  Diagnosis Date  . BPH (benign prostatic hyperplasia)   . CAD S/P percutaneous coronary angioplasty    a. PTCA of [place] b. PCI with BMS to LAD in 1997 c. RCA PCI [place] d. s/p CABG in 11/2011 with LIMA-LAD, SVG-PDA, SVG-OM2, and SVG-D1  . Chronic back pain   . CKD (chronic kidney disease), stage III ([place])   . Cyst of bursa    R shoulder  . Diabetic retinopathy ([place])   . DM (diabetes mellitus), type 2 with renal complications ([place])   . Essential hypertension   . Frequent PVCs   . GERD (gastroesophageal reflux disease)   . Ischemic cardiomyopathy 11/2011   Intra-OP TEE: EF 40-45%, no regional WMA; improved Anterior WM post CABG.  . Left carotid artery stenosis   . Mixed hyperlipidemia   . 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 BACK SURGERY  1970  . CARDIAC CATHETERIZATION  2013  . CORONARY ANGIOPLASTY  1994   OM  . CORONARY ANGIOPLASTY  1997   LAD  . CORONARY ANGIOPLASTY WITH STENT PLACEMENT  1999   RCA  . CORONARY ANGIOPLASTY WITH STENT PLACEMENT  2001   RCA  . CORONARY ARTERY BYPASS GRAFT  12/27/2011   Procedure: CORONARY ARTERY BYPASS GRAFTING (CABG);  Surgeon: Rexene Alberts, MD;  Location: [facility];  Service: Open Heart Surgery;   Laterality: N/A;  Times four. On pump. Using endoscopically harvested right greater saphenous vein and left internal mammary artery.   [name] HERNIA REPAIR    . INCISION AND DRAINAGE OF WOUND  2006   [place]  . INTRAOPERATIVE TRANSESOPHAGEAL ECHOCARDIOGRAM  12/27/2011   Global hypokinesis with EF of 40-45%, improved LAD distribution wall motion.  [name] LEFT HEART CATHETERIZATION WITH CORONARY ANGIOGRAM N/A 12/13/2011   Procedure: LEFT HEART CATHETERIZATION WITH CORONARY ANGIOGRAM;  Surgeon: Leonie Man, MD;  Location: [facility] CATH LAB;  Service: Cardiovascular;  Laterality: N/A;  . LUMBAR FUSION    . MASS EXCISION  10/24/11   R arm  . RIGHT HEART CATH N/A 07/12/2020   Procedure: RIGHT HEART CATH;  Surgeon: Belva Crome, MD;  Location: [place] CV LAB;  Service: Cardiovascular;  Laterality: N/A;  . TRANSESOPHAGEAL ECHOCARDIOGRAM  2013    Family History  Problem Relation Age of Onset  . Cancer Mother        breast  . Cancer Father        bone  . Celia cancer Neg Hx     Social History   Tobacco Use  . Smoking status: Former Smoker    Types: Cigarettes    Quit  date: 10/01/1986    Years since quitting: 33.8  . Smokeless tobacco: Never Used  . Tobacco comment: quit about 30 yrs ago  Vaping Use  . Vaping Use: Never used  Substance Use Topics  . Alcohol use: No  . Drug use: No    Medications: I have reviewed the patient's current medications.  No Known Allergies   ROS:  A comprehensive review of systems was negative except for: Musculoskeletal: positive for jerking, myoclonus  Blood pressure (!) 179/83, pulse 62, temperature 98 F (36.7 C), temperature source Oral, resp. rate 18, height 5\' 8"  (1.727 m), weight 61.4 kg, SpO2 98 %. Physical Exam  Results: Results for orders placed or performed during the hospital encounter of 07/20/20 (from the past 48 hour(s))  Glucose, capillary     Status: Abnormal   Collection Time: 07/23/20 11:42 AM  Result Value Ref Range    Glucose-Capillary 126 (H) 70 - 99 mg/dL    Comment: Glucose reference range applies only to samples taken after fasting for at least 8 hours.  Glucose, capillary     Status: Abnormal   Collection Time: 07/23/20  4:15 PM  Result Value Ref Range   Glucose-Capillary 129 (H) 70 - 99 mg/dL    Comment: Glucose reference range applies only to samples taken after fasting for at least 8 hours.  Glucose, capillary     Status: Abnormal   Collection Time: 07/23/20  8:21 PM  Result Value Ref Range   Glucose-Capillary 125 (H) 70 - 99 mg/dL    Comment: Glucose reference range applies only to samples taken after fasting for at least 8 hours.  Glucose, capillary     Status: Abnormal   Collection Time: 07/24/20 12:59 AM  Result Value Ref Range   Glucose-Capillary 120 (H) 70 - 99 mg/dL    Comment: Glucose reference range applies only to samples taken after fasting for at least 8 hours.  Glucose, capillary     Status: Abnormal   Collection Time: 07/24/20  4:40 AM  Result Value Ref Range   Glucose-Capillary 105 (H) 70 - 99 mg/dL    Comment: Glucose reference range applies only to samples taken after fasting for at least 8 hours.  Glucose, capillary     Status: None   Collection Time: 07/24/20  7:50 AM  Result Value Ref Range   Glucose-Capillary 90 70 - 99 mg/dL    Comment: Glucose reference range applies only to samples taken after fasting for at least 8 hours.  Renal function panel     Status: Abnormal   Collection Time: 07/24/20  8:23 AM  Result Value Ref Range   Sodium 133 (L) 135 - 145 mmol/L   Potassium 4.3 3.5 - 5.1 mmol/L   Chloride 101 98 - 111 mmol/L   CO2 21 (L) 22 - 32 mmol/L   Glucose, Bld 103 (H) 70 - 99 mg/dL    Comment: Glucose reference range applies only to samples taken after fasting for at least 8 hours.   BUN 95 (H) 8 - 23 mg/dL   Creatinine, Ser 4.22 (H) 0.61 - 1.24 mg/dL   Calcium 8.9 8.9 - 10.3 mg/dL   Phosphorus 4.7 (H) 2.5 - 4.6 mg/dL   Albumin 3.7 3.5 - 5.0 g/dL   GFR,  Estimated 14 (L) >60 mL/min    Comment: (NOTE) Calculated using the CKD-EPI Creatinine Equation (2021)    Anion gap 11 5 - 15    Comment: Performed at [facility], [address],  [place], [name] [address]  Glucose, capillary     Status: Abnormal   Collection Time: 07/24/20 11:31 AM  Result Value Ref Range   Glucose-Capillary 158 (H) 70 - 99 mg/dL    Comment: Glucose reference range applies only to samples taken after fasting for at least 8 hours.  Glucose, capillary     Status: Abnormal   Collection Time: 07/24/20  4:37 PM  Result Value Ref Range   Glucose-Capillary 134 (H) 70 - 99 mg/dL    Comment: Glucose reference range applies only to samples taken after fasting for at least 8 hours.   Comment 1 Notify RN   Glucose, capillary     Status: Abnormal   Collection Time: 07/24/20  8:19 PM  Result Value Ref Range   Glucose-Capillary 160 (H) 70 - 99 mg/dL    Comment: Glucose reference range applies only to samples taken after fasting for at least 8 hours.   Comment 1 Notify RN    Comment 2 Document in Chart   Glucose, capillary     Status: Abnormal   Collection Time: 07/24/20  9:02 PM  Result Value Ref Range   Glucose-Capillary 155 (H) 70 - 99 mg/dL    Comment: Glucose reference range applies only to samples taken after fasting for at least 8 hours.   Comment 1 Notify RN    Comment 2 Document in Chart   Glucose, capillary     Status: Abnormal   Collection Time: 07/25/20 12:32 AM  Result Value Ref Range   Glucose-Capillary 155 (H) 70 - 99 mg/dL    Comment: Glucose reference range applies only to samples taken after fasting for at least 8 hours.   Comment 1 Notify RN    Comment 2 Document in Chart   Glucose, capillary     Status: None   Collection Time: 07/25/20  4:17 AM  Result Value Ref Range   Glucose-Capillary 90 70 - 99 mg/dL    Comment: Glucose reference range applies only to samples taken after fasting for at least 8 hours.   Comment 1 Notify RN    Comment 2 Document in  Chart   Basic metabolic panel     Status: Abnormal   Collection Time: 07/25/20  6:11 AM  Result Value Ref Range   Sodium 135 135 - 145 mmol/L   Potassium 4.4 3.5 - 5.1 mmol/L   Chloride 101 98 - 111 mmol/L   CO2 23 22 - 32 mmol/L   Glucose, Bld 102 (H) 70 - 99 mg/dL    Comment: Glucose reference range applies only to samples taken after fasting for at least 8 hours.   BUN 95 (H) 8 - 23 mg/dL   Creatinine, Ser 4.01 (H) 0.61 - 1.24 mg/dL   Calcium 9.1 8.9 - 10.3 mg/dL   GFR, Estimated 15 (L) >60 mL/min    Comment: (NOTE) Calculated using the CKD-EPI Creatinine Equation (2021)    Anion gap 11 5 - 15    Comment: Performed at [facility], [address] [place], [place] [address]  CBC with Differential/Platelet     Status: Abnormal   Collection Time: 07/25/20  6:11 AM  Result Value Ref Range   WBC 8.2 4.0 - 10.5 K/uL   RBC 3.68 (L) 4.22 - 5.81 MIL/uL   Hemoglobin 11.4 (L) 13.0 - 17.0 g/dL   HCT 35.6 (L) 39 - 52 %   MCV 96.7 80.0 - 100.0 fL   MCH 31.0 26.0 - 34.0 pg   MCHC  32.0 30.0 - 36.0 g/dL   RDW 13.8 11.5 - 15.5 %   Platelets 193 150 - 400 K/uL   nRBC 0.0 0.0 - 0.2 %   Neutrophils Relative % 76 %   Neutro Abs 6.2 1.7 - 7.7 K/uL   Lymphocytes Relative 12 %   Lymphs Abs 1.0 0.7 - 4.0 K/uL   Monocytes Relative 9 %   Monocytes Absolute 0.7 0.1 - 1.0 K/uL   Eosinophils Relative 2 %   Eosinophils Absolute 0.2 0.0 - 0.5 K/uL   Basophils Relative 0 %   Basophils Absolute 0.0 0.0 - 0.1 K/uL   Immature Granulocytes 1 %   Abs Immature Granulocytes 0.04 0.00 - 0.07 K/uL    Comment: Performed at [facility], [address] [place], [place] [address]  Surgical pcr screen     Status: None   Collection Time: 07/25/20  6:30 AM   Specimen: Nasal Mucosa; Nasal Swab  Result Value Ref Range   MRSA, PCR NEGATIVE NEGATIVE   Staphylococcus aureus NEGATIVE NEGATIVE    Comment: (NOTE) The Xpert SA Assay (FDA approved for NASAL specimens in patients 14 years of age and older), is one  component of a comprehensive surveillance program. It is not intended to diagnose infection nor to guide or monitor treatment. Performed at [facility], [address], [place], [place] [address]   Glucose, capillary     Status: Abnormal   Collection Time: 07/25/20  7:42 AM  Result Value Ref Range   Glucose-Capillary 101 (H) 70 - 99 mg/dL    Comment: Glucose reference range applies only to samples taken after fasting for at least 8 hours.    DG Chest Port 1 View  Result Date: 07/25/2020 CLINICAL DATA:  Preop.  Status post coronary bypass graft. EXAM: PORTABLE CHEST 1 VIEW COMPARISON:  July 08, 2020. FINDINGS: The heart size and mediastinal contours are within normal limits. Both lungs are clear. The visualized skeletal structures are unremarkable. IMPRESSION: No active disease. Electronically Signed   By: Marijo Conception M.D.   On: 07/25/2020 10:17     Assessment & Plan:  Bradley Black is a 74 y.o. male with ESRD who needs dialysis catheter placement. We discussed placement on the right. Discussed risk of bleeding, infection, injury to vessels, pneumothorax, and use of [place] and fluorsoscopy.  Preop CXR without fluid overload. Lytes look good.   -Dialysis catheter today   All questions were answered to the satisfaction of the patient and family.   Virl Cagey 07/25/2020, 10:52 AM

## 2020-07-25 NOTE — Consult Note (Signed)
[facility] [place]  Patient name: Bradley Black MRN: [id number] DOB: [birth year] Sex: male  REASON FOR CONSULT: Discussed access for hemodialysis  HPI: Bradley Black is a 74 y.o. male, admitted with end-stage renal disease to [facility].  He is 4 tunneled hemodialysis catheter by Dr. Constance Haw today.  We have been consulted for discussion of permanent access.  Patient has no prior history of hemodialysis.  He has no prior history of catheters.  He does not have a pacemaker.  He is left-hand dominant.  Does have a prior history of coronary disease.  Had a bypass grafting in the past.  Past Medical History:  Diagnosis Date  . BPH (benign prostatic hyperplasia)   . CAD S/P percutaneous coronary angioplasty    a. PTCA of [place] b. PCI with BMS to LAD in 1997 c. RCA PCI [place] d. s/p CABG in 11/2011 with LIMA-LAD, SVG-PDA, SVG-OM2, and SVG-D1  . Chronic back pain   . CKD (chronic kidney disease), stage III ([place])   . Cyst of bursa    R shoulder  . Diabetic retinopathy ([place])   . DM (diabetes mellitus), type 2 with renal complications ([place])   . Essential hypertension   . Frequent PVCs   . GERD (gastroesophageal reflux disease)   . Ischemic cardiomyopathy 11/2011   Intra-OP TEE: EF 40-45%, no regional WMA; improved Anterior WM post CABG.  . Left carotid artery stenosis   . Mixed hyperlipidemia   . 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 Cancer Mother        breast  . Cancer Father        bone  . [place] cancer Neg Hx     SOCIAL HISTORY: Social History   Socioeconomic History  . Marital status: Married    Spouse name: [name]  . Number of children: 1  . Years of education: college  . Highest education level: Bachelor's degree (e.g., BA, AB, BS)  Occupational History  . Not on file  Tobacco Use  . Smoking status:  Former Smoker    Types: Cigarettes    Quit date: 10/01/1986    Years since quitting: 33.8  . Smokeless tobacco: Never Used  . Tobacco comment: quit about 30 yrs ago  Vaping Use  . Vaping Use: Never used  Substance and Sexual Activity  . Alcohol use: No  . Drug use: No  . Sexual activity: Not on file  Other Topics Concern  . Not on file  Social History Narrative   Married,  father of one,  grandfather 6. Works out at [employer] at [employer] roughly 2-3 days a week. He works on a treadmill. He does note having a hard time getting his heart rate up.   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   He quit smoking in 1988, and does not drink alcohol.   Social Determinants of Health   Financial Resource Strain:   . Difficulty of Paying Living Expenses: Not on file  Food Insecurity: No Food Insecurity  . Worried About Charity fundraiser in the Last Year: Never true  . Ran Out of Food in the Last Year: Never true  Transportation Needs: No Transportation Needs  . Lack of Transportation (Medical):  No  . Lack of Transportation (Non-Medical): No  Physical Activity:   . Days of Exercise per Week: Not on file  . Minutes of Exercise per Session: Not on file  Stress:   . Feeling of Stress : Not on file  Social Connections:   . Frequency of Communication with Friends and Family: Not on file  . Frequency of Social Gatherings with Friends and Family: Not on file  . Attends Religious Services: Not on file  . Active Member of Clubs or Organizations: Not on file  . Attends Archivist Meetings: Not on file  . Marital Status: Not on file  Intimate Partner Violence: Not At Risk  . Fear of Current or Ex-Partner: No  . Emotionally Abused: No  . Physically Abused: No  . Sexually Abused: No    No Known Allergies  Current Facility-Administered Medications  Medication Dose Route Frequency Provider Last Rate Last Admin  . 0.9 %  sodium chloride infusion   Intravenous Continuous Denese Killings, MD 10 mL/hr at 07/25/20 1301 New Bag at 07/25/20 1301  . [MAR Hold] amiodarone (PACERONE) tablet 200 mg  200 mg Oral Daily Adefeso, Oladapo, DO   200 mg at 07/25/20 0900  . [MAR Hold] aspirin EC tablet 81 mg  81 mg Oral Daily Adefeso, Oladapo, DO   81 mg at 07/25/20 0859  . [MAR Hold] B-complex with vitamin C tablet 1 tablet  1 tablet Oral Daily Nita Sells, MD   1 tablet at 07/25/20 0900  . [MAR Hold] calcitRIOL (ROCALTROL) capsule 0.5 mcg  0.5 mcg Oral Q M,W,F Adefeso, Oladapo, DO   0.5 mcg at 07/25/20 0855  . ceFAZolin (ANCEF) IVPB 2g/100 mL premix  2 g Intravenous On Call to OR Virl Cagey, MD      . Derrill Memo ON 07/26/2020] ceFAZolin (ANCEF) IVPB 2g/100 mL premix  2 g Intravenous On Call to OR Virl Cagey, MD      . chlorhexidine (PERIDEX) 0.12 % solution 15 mL  15 mL Mouth/Throat Once Battula, Rajamani C, MD      . chlorhexidine (PERIDEX) 0.12 % solution 15 mL  15 mL Mouth/Throat Once Denese Killings, MD       Or  . MEDLINE mouth rinse  15 mL Mouth Rinse Once Denese Killings, MD      . Doug Sou Hold] Chlorhexidine Gluconate Cloth 2 % PADS 6 each  6 each Topical Daily Barton Dubois, MD   6 each at 07/24/20 [phone]  . Chlorhexidine Gluconate Cloth 2 % PADS 6 each  6 each Topical Once Virl Cagey, MD       And  . Chlorhexidine Gluconate Cloth 2 % PADS 6 each  6 each Topical Once Virl Cagey, MD      . Doug Sou Hold] cinacalcet [facility]) tablet 30 mg  30 mg Oral Q breakfast Adefeso, Oladapo, DO   30 mg at 07/25/20 0900  . [MAR Hold] doxazosin (CARDURA) tablet 8 mg  8 mg Oral Daily Adefeso, Oladapo, DO   8 mg at 07/25/20 0858  . [MAR Hold] feeding supplement (NEPRO CARB STEADY) liquid 237 mL  237 mL Oral BID BM Nita Sells, MD   237 mL at 07/24/20 0844  . [MAR Hold] heparin injection 5,000 Units  5,000 Units Subcutaneous Q8H Adefeso, Oladapo, DO   5,000 Units at 07/24/20 2214  . [MAR Hold] hydrALAZINE (APRESOLINE) tablet 75 mg  75 mg Oral TID  Adefeso, Oladapo, DO   75 mg  at 07/25/20 0900  . [MAR Hold] insulin aspart (novoLOG) injection 0-9 Units  0-9 Units Subcutaneous Q4H Adefeso, Oladapo, DO   1 Units at 07/24/20 1639  . [MAR Hold] isosorbide mononitrate (IMDUR) 24 hr tablet 30 mg  30 mg Oral Daily Adefeso, Oladapo, DO   30 mg at 07/25/20 0859  . [MAR Hold] LORazepam (ATIVAN) tablet 0.5 mg  0.5 mg Oral BID PRN Nita Sells, MD   0.5 mg at 07/22/20 2338  . [MAR Hold] pantoprazole (PROTONIX) EC tablet 40 mg  40 mg Oral BID Adefeso, Oladapo, DO   40 mg at 07/25/20 0859    REVIEW OF SYSTEMS:  Reviewed in his history and physical with nothing to add  PHYSICAL EXAM: Vitals:   07/24/20 1539 07/24/20 2104 07/25/20 0553 07/25/20 1228  BP: (!) 147/69 (!) 156/67 (!) 179/83 (!) 155/70  Pulse: (!) 58 (!) 58 62 (!) 56  Resp: 20 20 18 13   Temp: 97.8 F (36.6 C) 98.1 F (36.7 C) 98 F (36.7 C) 97.6 F (36.4 C)  TempSrc: Oral Oral Oral Oral  SpO2: 98% 99% 98% 96%  Weight:      Height:        GENERAL: The patient is a well-nourished male, in no acute distress. The vital signs are documented above. CARDIAC: There is a regular rate and rhythm.  VASCULAR: 2+ radial pulses bilaterally.  Prominent cephalic vein beginning at his wrist and extending throughout his forearm on the left.  IV in his right cephalic vein with smaller veins. PULMONARY: There is good air exchange bilaterally without wheezing or rales. ABDOMEN: Soft and non-tender with normal pitched bowel sounds.  MUSCULOSKELETAL: There are no major deformities or cyanosis. NEUROLOGIC: No focal weakness or paresthesias are detected. SKIN: There are no ulcers or rashes noted. PSYCHIATRIC: The patient has a normal affect.  DATA:   Vein mapping pending  MEDICAL ISSUES:  I discussed options with the patient.  Explained that today's catheter will be used for acute hemodialysis.  I explained options of AV fistula versus AV graft and advantages and disadvantages of each of  these.  He does appear to have excellent size cephalic vein on physical exam on his left arm for fistula creation.  This is his dominant arm but I explained that the would proceed with fistula in the better cephalic vein arm which is his left arm.  We will restrict access for his left arm.  Plan for left arm AV fistula creation on Thursday, October 28.  This could be done as an inpatient if he has not been discharged or could be outpatient surgery if he has been discharged.   Curt Jews [facility] [phone]

## 2020-07-25 NOTE — Anesthesia Postprocedure Evaluation (Signed)
Anesthesia Post Note  Patient: Ahmari L Boline  Procedure(s) Performed: INSERTION OF DIALYSIS CATHETER (Right )  Patient location during evaluation: PACU Anesthesia Type: MAC Level of consciousness: awake and alert, oriented and patient cooperative Pain management: pain level controlled Vital Signs Assessment: post-procedure vital signs reviewed and stable Respiratory status: spontaneous breathing, nonlabored ventilation and respiratory function stable Cardiovascular status: blood pressure returned to baseline and stable Postop Assessment: no apparent nausea or vomiting Anesthetic complications: no   No complications documented.   Last Vitals:  Vitals:   07/25/20 0553 07/25/20 1228  BP: (!) 179/83 (!) 155/70  Pulse: 62 (!) 56  Resp: 18 13  Temp: 36.7 C 36.4 C  SpO2: 98% 96%    Last Pain:  Vitals:   07/25/20 1228  TempSrc: Oral  PainSc:                  Shadman Tozzi

## 2020-07-25 NOTE — Progress Notes (Signed)
PROGRESS NOTE    Bradley Black  RKY:[id number] DOB: 03/24/1946 DOA: 07/20/2020 PCP: Susy Frizzle, MD  Brief Narrative:   74 year old white male Ischemic cardiomyopathy with systolic/diastolic parameters--History of CABG x4, left carotid artery stenosis--cardiac arrest with angioplasty 1997, stenting to RCA 99 and 2001-finally CABG X4 2013 Dr. 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 NSVT--ablation back in 06/2020 not indicated BPH  Recent multiple hospitalizations 10/8 through 07/13/2020 with chest pain combined heart failure exacerbation-went to [facility] for cardiac cath which showed LVEF 25% with severely reduced RV-at that admission creatinine trended upwards to 4.4 and was to be set up with nephrology in the outpatient setting  Readmitted 1018 through 1019 with myoclonic jerking secondary probably to Celexa in the setting of renal insufficiency-not discharged at that time creatinine was 4.1 he was readmitted because of repeated jerking  Emergency room work-up showed hemodynamically stable BUNs/creatinine 100/5.1 total bilirubin 1.7 potassium 5.2 Ativan was given to control the tremors Patient was sleepy on arrival  He has improved to some degree in the hospital, but will need initiation of dialysis this admit  Will have temp access placed on 10/25  Assessment & Plan:   Principal Problem:   Tremors of nervous system Active Problems:   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 Myoclonic jerks with generalized weakness a. Myoclonus resolved-give low-dose Ativan 2/2 anxiety  /jerks b. Asterixis is probably from azotemia and is improved 2. Chronic kidney disease stage V a. Nephrology consult appreciated planning for in-house initiation of dialysis b. Dialysis cath placed R chest Monday 10/25 c. Follow re: further plans for [place] later this week 10.28 3. Hypocalcemia hyponatremia hyperkalemia a. k improved-rpt renal panel in am b. continuing Sensipar 30 daily, calcitriol 0.1 Monday Wednesday Friday 4. Elevated total bili a. Unclear etiology, patient may have nonalcoholic steatohepatitis? Improved gradually over hosp stay b. Hepatitis panel is negative c. Rpt LFT in OP and work-up if approp 5. Prolonged QTC 504 a. Keep on monitors and try to avoid QT prolonging agents 6. DM TY 2 A1c 5.7 a. CBGs well-controlled below 90-160, sliding scale d/c post procedure b. Resume Januvia post- procedure 7. Reflux 8. History of CABG X4 a. Continue Imdur 30 daily Cardura 8 daily and hydralazine 75 3 times daily b. Some uncontrolled today c. For dialysis eventually-may help with elevated BP 9. History of PVC NSVT a. Resumed amiodarone 200 daily for NSVT-monitor trends slightly bradycardic on monitors b. Would d/c monitors as this is chronic  DVT prophylaxis: On heparin Code Status: Full CODE STATUS Family Communication:  Disposition: Inpatient  Status is: Inpatient  Remains inpatient appropriate because:Persistent severe electrolyte disturbances, Ongoing active pain requiring inpatient pain management, Altered mental status and IV treatments appropriate due to intensity of illness or inability to take PO   Dispo: The patient is from: Home              Anticipated d/c is to: Unclear at this time              Anticipated d/c date is: >  3 days              Patient currently is not medically stable to d/c.  Consultants:   Nephrology  Procedures: None  Antimicrobials: No   Subjective:   Well just back from procedure  Had some nausea prior probably form  no eating Better now No pain in chest  no tremors  Objective: Vitals:   07/24/20 0446 07/24/20 1539 07/24/20 2104 07/25/20 0553  BP:  (!) 147/69 (!) 156/67 (!) 179/83  Pulse: 66 (!) 58 (!) 58 62  Resp:  20 20 18   Temp:  97.8 F (36.6 C) 98.1 F (36.7 C) 98 F (36.7 C)  TempSrc:  Oral Oral Oral  SpO2: 98% 98% 99% 98%  Weight:      Height:       No intake or output data in the 24 hours ending 07/25/20 0728 Filed Weights   07/20/20 1735 07/21/20 0200  Weight: 63.5 kg 61.4 kg    Examination:  Awake pleasant alert in nad no focal deficit eomi ncat flat affect Fistula in place in R upper chest clean and dry dressing cta b no added sound   Data Reviewed: I have personally reviewed following labs and imaging studies  Sodium 133 Bun /creat 95/4.22 co2 21 Phos 4.7   Radiology Studies: No results found.   Scheduled Meds: . amiodarone  200 mg Oral Daily  . aspirin EC  81 mg Oral Daily  . B-complex with vitamin C  1 tablet Oral Daily  . calcitRIOL  0.5 mcg Oral Q M,W,F  . chlorhexidine  15 mL Mouth/Throat Once  . Chlorhexidine Gluconate Cloth  6 each Topical Daily  . cinacalcet  30 mg Oral Q breakfast  . doxazosin  8 mg Oral Daily  . feeding supplement (NEPRO CARB STEADY)  237 mL Oral BID BM  . heparin  5,000 Units Subcutaneous Q8H  . hydrALAZINE  75 mg Oral TID  . isosorbide mononitrate  30 mg Oral Daily  . linagliptin  5 mg Oral Daily  . pantoprazole  40 mg Oral BID   Continuous Infusions:   LOS: 5 days    Time spent: 20  Nita Sells, MD [facility] To contact the attending provider between 7A-7P or the covering provider during after hours 7P-7A, please log into the web site [email or web address] and access using universal [place] password for that web site. If you do not have the password, please call the hospital operator.  07/25/2020, 7:28 AM

## 2020-07-25 NOTE — H&P (View-Only) (Signed)
[facility] [place]  Patient name: Bradley Black MRN: [id number] DOB: 04-Aug-1946 Sex: male  REASON FOR CONSULT: Discussed access for hemodialysis  HPI: Bradley Black is a 74 y.o. male, admitted with end-stage renal disease to [name].  He is 4 tunneled hemodialysis catheter by Dr. Constance Haw today.  We have been consulted for discussion of permanent access.  Patient has no prior history of hemodialysis.  He has no prior history of catheters.  He does not have a pacemaker.  He is left-hand dominant.  Does have a prior history of coronary disease.  Had a bypass grafting in the past.  Past Medical History:  Diagnosis Date  . BPH (benign prostatic hyperplasia)   . CAD S/P percutaneous coronary angioplasty    a. PTCA of [place] b. PCI with BMS to LAD in 1997 c. RCA PCI [place] d. s/p CABG in 11/2011 with LIMA-LAD, SVG-PDA, SVG-OM2, and SVG-D1  . Chronic back pain   . CKD (chronic kidney disease), stage III ([place])   . Cyst of bursa    R shoulder  . Diabetic retinopathy ([place])   . DM (diabetes mellitus), type 2 with renal complications ([place])   . Essential hypertension   . Frequent PVCs   . GERD (gastroesophageal reflux disease)   . Ischemic cardiomyopathy 11/2011   Intra-OP TEE: EF 40-45%, no regional WMA; improved Anterior WM post CABG.  . Left carotid artery stenosis   . Mixed hyperlipidemia   . 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 Cancer Mother        breast  . Cancer Father        bone  . [place] cancer Neg Hx     SOCIAL HISTORY: Social History   Socioeconomic History  . Marital status: Married    Spouse name: [name]  . Number of children: 1  . Years of education: college  . Highest education level: Bachelor's degree (e.g., BA, AB, BS)  Occupational History  . Not on file  Tobacco Use  . Smoking status:  Former Smoker    Types: Cigarettes    Quit date: 10/01/1986    Years since quitting: 33.8  . Smokeless tobacco: Never Used  . Tobacco comment: quit about 30 yrs ago  Vaping Use  . Vaping Use: Never used  Substance and Sexual Activity  . Alcohol use: No  . Drug use: No  . Sexual activity: Not on file  Other Topics Concern  . Not on file  Social History Narrative   Married,  father of one,  grandfather 67. Works out at [employer] at [employer] roughly 2-3 days a week. He works on a treadmill. He does note having a hard time getting his heart rate up.   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   He quit smoking in 1988, and does not drink alcohol.   Social Determinants of Health   Financial Resource Strain:   . Difficulty of Paying Living Expenses: Not on file  Food Insecurity: No Food Insecurity  . Worried About Charity fundraiser in the Last Year: Never true  . Ran Out of Food in the Last Year: Never true  Transportation Needs: No Transportation Needs  . Lack of Transportation (Medical):  No  . Lack of Transportation (Non-Medical): No  Physical Activity:   . Days of Exercise per Week: Not on file  . Minutes of Exercise per Session: Not on file  Stress:   . Feeling of Stress : Not on file  Social Connections:   . Frequency of Communication with Friends and Family: Not on file  . Frequency of Social Gatherings with Friends and Family: Not on file  . Attends Religious Services: Not on file  . Active Member of Clubs or Organizations: Not on file  . Attends Archivist Meetings: Not on file  . Marital Status: Not on file  Intimate Partner Violence: Not At Risk  . Fear of Current or Ex-Partner: No  . Emotionally Abused: No  . Physically Abused: No  . Sexually Abused: No    No Known Allergies  Current Facility-Administered Medications  Medication Dose Route Frequency Provider Last Rate Last Admin  . 0.9 %  sodium chloride infusion   Intravenous Continuous Denese Killings, MD 10 mL/hr at 07/25/20 1301 New Bag at 07/25/20 1301  . [MAR Hold] amiodarone (PACERONE) tablet 200 mg  200 mg Oral Daily Adefeso, Oladapo, DO   200 mg at 07/25/20 0900  . [MAR Hold] aspirin EC tablet 81 mg  81 mg Oral Daily Adefeso, Oladapo, DO   81 mg at 07/25/20 0859  . [MAR Hold] B-complex with vitamin C tablet 1 tablet  1 tablet Oral Daily Nita Sells, MD   1 tablet at 07/25/20 0900  . [MAR Hold] calcitRIOL (ROCALTROL) capsule 0.5 mcg  0.5 mcg Oral Q M,W,F Adefeso, Oladapo, DO   0.5 mcg at 07/25/20 0855  . ceFAZolin (ANCEF) IVPB 2g/100 mL premix  2 g Intravenous On Call to OR Virl Cagey, MD      . Derrill Memo ON 07/26/2020] ceFAZolin (ANCEF) IVPB 2g/100 mL premix  2 g Intravenous On Call to OR Virl Cagey, MD      . chlorhexidine (PERIDEX) 0.12 % solution 15 mL  15 mL Mouth/Throat Once Battula, Rajamani C, MD      . chlorhexidine (PERIDEX) 0.12 % solution 15 mL  15 mL Mouth/Throat Once Denese Killings, MD       Or  . MEDLINE mouth rinse  15 mL Mouth Rinse Once Denese Killings, MD      . Doug Sou Hold] Chlorhexidine Gluconate Cloth 2 % PADS 6 each  6 each Topical Daily Barton Dubois, MD   6 each at 07/24/20 [phone]  . Chlorhexidine Gluconate Cloth 2 % PADS 6 each  6 each Topical Once Virl Cagey, MD       And  . Chlorhexidine Gluconate Cloth 2 % PADS 6 each  6 each Topical Once Virl Cagey, MD      . Doug Sou Hold] cinacalcet [facility]) tablet 30 mg  30 mg Oral Q breakfast Adefeso, Oladapo, DO   30 mg at 07/25/20 0900  . [MAR Hold] doxazosin (CARDURA) tablet 8 mg  8 mg Oral Daily Adefeso, Oladapo, DO   8 mg at 07/25/20 0858  . [MAR Hold] feeding supplement (NEPRO CARB STEADY) liquid 237 mL  237 mL Oral BID BM Nita Sells, MD   237 mL at 07/24/20 0844  . [MAR Hold] heparin injection 5,000 Units  5,000 Units Subcutaneous Q8H Adefeso, Oladapo, DO   5,000 Units at 07/24/20 2214  . [MAR Hold] hydrALAZINE (APRESOLINE) tablet 75 mg  75 mg Oral TID  Adefeso, Oladapo, DO   75 mg  at 07/25/20 0900  . [MAR Hold] insulin aspart (novoLOG) injection 0-9 Units  0-9 Units Subcutaneous Q4H Adefeso, Oladapo, DO   1 Units at 07/24/20 1639  . [MAR Hold] isosorbide mononitrate (IMDUR) 24 hr tablet 30 mg  30 mg Oral Daily Adefeso, Oladapo, DO   30 mg at 07/25/20 0859  . [MAR Hold] LORazepam (ATIVAN) tablet 0.5 mg  0.5 mg Oral BID PRN Nita Sells, MD   0.5 mg at 07/22/20 2338  . [MAR Hold] pantoprazole (PROTONIX) EC tablet 40 mg  40 mg Oral BID Adefeso, Oladapo, DO   40 mg at 07/25/20 0859    REVIEW OF SYSTEMS:  Reviewed in his history and physical with nothing to add  PHYSICAL EXAM: Vitals:   07/24/20 1539 07/24/20 2104 07/25/20 0553 07/25/20 1228  BP: (!) 147/69 (!) 156/67 (!) 179/83 (!) 155/70  Pulse: (!) 58 (!) 58 62 (!) 56  Resp: 20 20 18 13   Temp: 97.8 F (36.6 C) 98.1 F (36.7 C) 98 F (36.7 C) 97.6 F (36.4 C)  TempSrc: Oral Oral Oral Oral  SpO2: 98% 99% 98% 96%  Weight:      Height:        GENERAL: The patient is a well-nourished male, in no acute distress. The vital signs are documented above. CARDIAC: There is a regular rate and rhythm.  VASCULAR: 2+ radial pulses bilaterally.  Prominent cephalic vein beginning at his wrist and extending throughout his forearm on the left.  IV in his right cephalic vein with smaller veins. PULMONARY: There is good air exchange bilaterally without wheezing or rales. ABDOMEN: Soft and non-tender with normal pitched bowel sounds.  MUSCULOSKELETAL: There are no major deformities or cyanosis. NEUROLOGIC: No focal weakness or paresthesias are detected. SKIN: There are no ulcers or rashes noted. PSYCHIATRIC: The patient has a normal affect.  DATA:   Vein mapping pending  MEDICAL ISSUES:  I discussed options with the patient.  Explained that today's catheter will be used for acute hemodialysis.  I explained options of AV fistula versus AV graft and advantages and disadvantages of each of  these.  He does appear to have excellent size cephalic vein on physical exam on his left arm for fistula creation.  This is his dominant arm but I explained that the would proceed with fistula in the better cephalic vein arm which is his left arm.  We will restrict access for his left arm.  Plan for left arm AV fistula creation on Thursday, October 28.  This could be done as an inpatient if he has not been discharged or could be outpatient surgery if he has been discharged.   Curt Jews [facility] [phone]

## 2020-07-25 NOTE — Transfer of Care (Signed)
Immediate Anesthesia Transfer of Care Note  Patient: Bradley Black  Procedure(s) Performed: INSERTION OF DIALYSIS CATHETER (Right )  Patient Location: PACU  Anesthesia Type:MAC  Level of Consciousness: awake, alert , oriented and patient cooperative  Airway & Oxygen Therapy: Patient Spontanous Breathing  Post-op Assessment: Report given to RN and Post -op Vital signs reviewed and stable  Post vital signs: Reviewed and stable  Last Vitals:  Vitals Value Taken Time  BP 97/47 07/25/20 1400  Temp    Pulse 75 07/25/20 1400  Resp 21 07/25/20 1400  SpO2 96 % 07/25/20 1400  Vitals shown include unvalidated device data.  Last Pain:  Vitals:   07/25/20 1228  TempSrc: Oral  PainSc:          Complications: No complications documented.

## 2020-07-25 NOTE — Progress Notes (Signed)
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 For [facility] today, then HD#1 . Will need CLIP to [facility] . Discussed ne3ed for AV access   No intake/output data recorded.  Filed Weights   07/20/20 1735 07/21/20 0200  Weight: 63.5 kg 61.4 kg    Scheduled Meds: . amiodarone  200 mg Oral Daily  . aspirin EC  81 mg Oral Daily  . B-complex with vitamin C  1 tablet Oral Daily  . calcitRIOL  0.5 mcg Oral Q M,W,F  . Chlorhexidine Gluconate Cloth  6 each Topical Daily  . cinacalcet  30 mg Oral Q breakfast  . doxazosin  8 mg Oral Daily  . feeding supplement (NEPRO CARB STEADY)  237 mL Oral BID BM  . heparin  5,000 Units Subcutaneous Q8H  . hydrALAZINE  75 mg Oral TID  . insulin aspart  0-9 Units Subcutaneous Q4H  . isosorbide mononitrate  30 mg Oral Daily  . pantoprazole  40 mg Oral BID   Continuous Infusions: .  ceFAZolin (ANCEF) IV     PRN Meds:.LORazepam  Current Labs: reviewed    Physical Exam:  Blood pressure (!) 179/83, pulse 62, temperature 98 F (36.7 C), temperature source Oral, resp. rate 18, height 5\' 8"  (1.727 m), weight 61.4 kg, SpO2 98 %. NAD RRR CTAB No sig LEE NCAT Thin, soft, nt  A 1. New ESRD:  1. for [facility] 10/25, then HD#1 today 2. CLIP needed 3. Will do vein mapping and d/w Dr Donnetta Hutching for AVF/G 4. HD#2 10/26 2. Myoclonus, multifactorial including uremia, as above 3. HTN, up some, CTM on HD 4. Anemia. Stable 11s. TSAT 35%, no ESA currently 5. CAD hx/o CABG 6. 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 As above . Medication Issues; o Preferred narcotic agents for pain control are hydromorphone, fentanyl, and methadone. Morphine should not be used.  o Baclofen should be avoided o Avoid oral sodium phosphate and magnesium citrate based laxatives / bowel preps    Pearson Grippe MD 07/25/2020, 9:42 AM  Recent Labs  Lab 07/21/20 0430 07/21/20 0430 07/22/20 0626 07/24/20 0823  07/25/20 0611  NA 138   < > 135 133* 135  K 4.6   < > 4.6 4.3 4.4  CL 98   < > 98 101 101  CO2 25   < > 23 21* 23  GLUCOSE 94   < > 100* 103* 102*  BUN 108*   < > 108* 95* 95*  CREATININE 5.00*   < > 4.59* 4.22* 4.01*  CALCIUM 8.8*   < > 8.5* 8.9 9.1  PHOS 6.1*  --  5.8* 4.7*  --    < > = values in this interval not displayed.   Recent Labs  Lab 07/20/20 1915 07/20/20 1915 07/21/20 0430 07/22/20 0621 07/25/20 0611  WBC 9.0   < > 9.1 9.6 8.2  NEUTROABS 7.1  --   --  7.5 6.2  HGB 12.0*   < > 12.4* 11.9* 11.4*  HCT 36.1*   < > 37.6* 36.3* 35.6*  MCV 94.8   < > 94.9 96.8 96.7  PLT 200   < > 191 201 193   < > = values in this interval not displayed.

## 2020-07-25 NOTE — Procedures (Signed)
     HEMODIALYSIS TREATMENT NOTE (HD#1):   Indications / risks / benefits of renal replacement therapy were reviewed with patient and his wife prior to obtaining consent for first ever hemodialysis treatment on 07/25/20.  RIJ TDC placed earlier today by Dr. Constance Haw tolerated prescribed flow with stable pressures.  1.5 hour heparin-free treatment completed without problems.  500cc removed.  Hemodynamically stable.  All blood was returned.  Rockwell Alexandria, RN

## 2020-07-25 NOTE — Progress Notes (Signed)
[facility]  Notified wife catheter placed without issues.  CXR pending.  Curlene Labrum, MD [facility] [address] [place], [place] [address] [phone] (office)

## 2020-07-25 NOTE — Anesthesia Preprocedure Evaluation (Addendum)
Anesthesia Evaluation  Patient identified by MRN, date of birth, ID band Patient awake    Reviewed: Allergy & Precautions, NPO status , Patient's Chart, lab work & pertinent test results  History of Anesthesia Complications Negative for: history of anesthetic complications  Airway Mallampati: II  TM Distance: >3 FB Neck ROM: Full    Dental  (+) Missing, Dental Advisory Given   Pulmonary former smoker,    Pulmonary exam normal breath sounds clear to auscultation       Cardiovascular Exercise Tolerance: Poor hypertension, Pt. on medications and Pt. on home beta blockers + CAD, + Past MI, + Cardiac Stents and + CABG  + Valvular Problems/Murmurs  Rhythm:Regular Rate:Normal  1. Compared to previous echo, LVEF and RVEF are worse . Left ventricular  ejection fraction, by estimation, is 25%%. The left ventricle has severely  decreased function. The left ventricle demonstrates global hypokinesis.  The left ventricular internal  cavity size was moderately dilated. Left ventricular diastolic parameters  are consistent with Grade III diastolic dysfunction (restrictive).  Elevated left atrial pressure.  2. Right ventricular systolic function is severely reduced. The right  ventricular size is mildly enlarged. There is moderately elevated  pulmonary artery systolic pressure.  3. Left atrial size was severely dilated.  4. Right atrial size was moderately dilated.  5. The mitral valve is normal in structure. Mild to moderate mitral valve  regurgitation.  6. The aortic valve is tricuspid. Aortic valve regurgitation is trivial.  7. The inferior vena cava is dilated in size with <50% respiratory  variability, suggesting right atrial pressure of 15 mmHg.   21-Jul-2020 02:01:26 [facility]-ER ROUTINE RECORD Sinus rhythm LVH with secondary repolarization abnormality Inferior infarct, old  Frequent PVCs    Neuro/Psych negative psych ROS   GI/Hepatic GERD  Medicated,  Endo/Other  diabetes, Well Controlled, Type 2, Oral Hypoglycemic Agents  Renal/GU ESRFRenal disease     Musculoskeletal negative musculoskeletal ROS (+)   Abdominal   Peds  Hematology  (+) anemia ,   Anesthesia Other Findings   Reproductive/Obstetrics negative OB ROS                            Anesthesia Physical Anesthesia Plan  ASA: IV  Anesthesia Plan: General   Post-op Pain Management:    Induction: Intravenous  PONV Risk Score and Plan: 3 and TIVA  Airway Management Planned: Nasal Cannula, Natural Airway and Simple Face Mask  Additional Equipment:   Intra-op Plan:   Post-operative Plan:   Informed Consent: I have reviewed the patients History and Physical, chart, labs and discussed the procedure including the risks, benefits and alternatives for the proposed anesthesia with the patient or authorized representative who has indicated his/her understanding and acceptance.     Dental advisory given  Plan Discussed with: CRNA and Surgeon  Anesthesia Plan Comments: (Possible GA with LMA)        Anesthesia Quick Evaluation

## 2020-07-26 ENCOUNTER — Inpatient Hospital Stay (HOSPITAL_COMMUNITY): Payer: Medicare Other

## 2020-07-26 ENCOUNTER — Encounter (HOSPITAL_COMMUNITY): Payer: Self-pay | Admitting: General Surgery

## 2020-07-26 DIAGNOSIS — R251 Tremor, unspecified: Secondary | ICD-10-CM | POA: Diagnosis not present

## 2020-07-26 LAB — CBC WITH DIFFERENTIAL/PLATELET
Abs Immature Granulocytes: 0.05 10*3/uL (ref 0.00–0.07)
Basophils Absolute: 0 10*3/uL (ref 0.0–0.1)
Basophils Relative: 0 %
Eosinophils Absolute: 0.3 10*3/uL (ref 0.0–0.5)
Eosinophils Relative: 3 %
HCT: 34.6 % — ABNORMAL LOW (ref 39.0–52.0)
Hemoglobin: 11.4 g/dL — ABNORMAL LOW (ref 13.0–17.0)
Immature Granulocytes: 1 %
Lymphocytes Relative: 11 %
Lymphs Abs: 1 10*3/uL (ref 0.7–4.0)
MCH: 31.9 pg (ref 26.0–34.0)
MCHC: 32.9 g/dL (ref 30.0–36.0)
MCV: 96.9 fL (ref 80.0–100.0)
Monocytes Absolute: 0.8 10*3/uL (ref 0.1–1.0)
Monocytes Relative: 9 %
Neutro Abs: 6.8 10*3/uL (ref 1.7–7.7)
Neutrophils Relative %: 76 %
Platelets: 154 10*3/uL (ref 150–400)
RBC: 3.57 MIL/uL — ABNORMAL LOW (ref 4.22–5.81)
RDW: 13.7 % (ref 11.5–15.5)
WBC: 9 10*3/uL (ref 4.0–10.5)
nRBC: 0 % (ref 0.0–0.2)

## 2020-07-26 LAB — RENAL FUNCTION PANEL
Albumin: 3.5 g/dL (ref 3.5–5.0)
Anion gap: 9 (ref 5–15)
BUN: 66 mg/dL — ABNORMAL HIGH (ref 8–23)
CO2: 22 mmol/L (ref 22–32)
Calcium: 8.9 mg/dL (ref 8.9–10.3)
Chloride: 102 mmol/L (ref 98–111)
Creatinine, Ser: 3.45 mg/dL — ABNORMAL HIGH (ref 0.61–1.24)
GFR, Estimated: 18 mL/min — ABNORMAL LOW (ref >60)
Glucose, Bld: 99 mg/dL (ref 70–99)
Phosphorus: 4.5 mg/dL (ref 2.5–4.6)
Potassium: 4.2 mmol/L (ref 3.5–5.1)
Sodium: 133 mmol/L — ABNORMAL LOW (ref 135–145)

## 2020-07-26 LAB — GLUCOSE, CAPILLARY
Glucose-Capillary: 125 mg/dL — ABNORMAL HIGH (ref 70–99)
Glucose-Capillary: 136 mg/dL — ABNORMAL HIGH (ref 70–99)
Glucose-Capillary: 141 mg/dL — ABNORMAL HIGH (ref 70–99)
Glucose-Capillary: 156 mg/dL — ABNORMAL HIGH (ref 70–99)
Glucose-Capillary: 97 mg/dL (ref 70–99)
Glucose-Capillary: 99 mg/dL (ref 70–99)

## 2020-07-26 LAB — HEPATITIS B CORE ANTIBODY, TOTAL: Hep B Core Total Ab: NONREACTIVE

## 2020-07-26 NOTE — Progress Notes (Signed)
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 S/p TDC and HD#1 yesterday . Dr. Donnetta Hutching tentative for AVF on 10/28 . No c/o this AM . CLIP in process  10/25 0701 - 10/26 0700 In: 384 [P.O.:240; I.V.:44; IV Piggyback:100] Out: 1070 [Urine:550; Blood:20]  Filed Weights   07/20/20 1735 07/21/20 0200  Weight: 63.5 kg 61.4 kg    Scheduled Meds: . amiodarone  200 mg Oral Daily  . aspirin EC  81 mg Oral Daily  . B-complex with vitamin C  1 tablet Oral Daily  . calcitRIOL  0.5 mcg Oral Q M,W,F  . chlorhexidine  15 mL Mouth/Throat Once  . Chlorhexidine Gluconate Cloth  6 each Topical Daily  . cinacalcet  30 mg Oral Q breakfast  . doxazosin  8 mg Oral Daily  . feeding supplement (NEPRO CARB STEADY)  237 mL Oral BID BM  . heparin  5,000 Units Subcutaneous Q8H  . hydrALAZINE  75 mg Oral TID  . isosorbide mononitrate  30 mg Oral Daily  . linagliptin  5 mg Oral Daily  . pantoprazole  40 mg Oral BID   Continuous Infusions: . sodium chloride    . sodium chloride     PRN Meds:.sodium chloride, sodium chloride, alteplase, heparin, HYDROcodone-acetaminophen, lidocaine (PF), lidocaine-prilocaine, LORazepam, pentafluoroprop-tetrafluoroeth  Current Labs: reviewed    Physical Exam:  Blood pressure (!) 191/62, pulse 63, temperature 98 F (36.7 C), temperature source Oral, resp. rate 16, height 5\' 8"  (1.727 m), weight 61.4 kg, SpO2 98 %. NAD RRR CTAB No sig LEE NCAT Thin, soft, nt  A 1. New ESRD:  1. [facility] 10/25 R IJ with Dr. Constance Haw 2. Started HD 10/25 3. CLIP not yet complete 4. Vein mapping today, tentative AVF/G 10/28 5. HD#2 10/26, gradual inc in tx dosing 2. Myoclonus, multifactorial including uremia, as above 3. HTN, up some, CTM on HD 4. Anemia. Stable 11s. TSAT 35%, no ESA currently 5. CAD hx/o CABG 6. 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 As above . Medication Issues; o Preferred narcotic  agents for pain control are hydromorphone, fentanyl, and methadone. Morphine should not be used.  o Baclofen should be avoided o Avoid oral sodium phosphate and magnesium citrate based laxatives / bowel preps    Pearson Grippe MD 07/26/2020, 9:05 AM  Recent Labs  Lab 07/24/20 5621 07/24/20 0823 07/25/20 0611 07/25/20 1517 07/26/20 0444  NA 133*   < > 135 135 133*  K 4.3   < > 4.4 4.8 4.2  CL 101   < > 101 103 102  CO2 21*   < > 23 20* 22  GLUCOSE 103*   < > 102* 158* 99  BUN 95*   < > 95* 89* 66*  CREATININE 4.22*   < > 4.01* 3.97* 3.45*  CALCIUM 8.9   < > 9.1 9.0 8.9  PHOS 4.7*  --   --  4.6 4.5   < > = values in this interval not displayed.   Recent Labs  Lab 07/22/20 0621 07/22/20 0621 07/25/20 0611 07/25/20 1517 07/26/20 0444  WBC 9.6   < > 8.2 7.6 9.0  NEUTROABS 7.5  --  6.2  --  6.8  HGB 11.9*   < > 11.4* 11.7* 11.4*  HCT 36.3*   < > 35.6* 36.0* 34.6*  MCV 96.8   < > 96.7 97.8 96.9  PLT 201   < >  193 174 154   < > = values in this interval not displayed.

## 2020-07-26 NOTE — Progress Notes (Signed)
PROGRESS NOTE    Bradley Black  POE:[id number] DOB: 30-Jul-1946 DOA: 07/20/2020 PCP: Susy Frizzle, MD  Brief Narrative:   74 year old white male Ischemic cardiomyopathy with systolic/diastolic parameters--History of CABG x4, left carotid artery stenosis--cardiac arrest with angioplasty 1997, stenting to RCA 99 and 2001-finally CABG X4 2013 Dr. 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 NSVT--ablation back in 06/2020 not indicated BPH  Recent multiple hospitalizations 10/8 through 07/13/2020 with chest pain combined heart failure exacerbation-went to [facility] for cardiac cath which showed LVEF 25% with severely reduced RV-at that admission creatinine trended upwards to 4.4 and was to be set up with nephrology in the outpatient setting  Readmitted 1018 through 1019 with myoclonic jerking secondary probably to Celexa in the setting of renal insufficiency-not discharged at that time creatinine was 4.1 he was readmitted because of repeated jerking  Emergency room work-up showed hemodynamically stable BUNs/creatinine 100/5.1 total bilirubin 1.7 potassium 5.2 Ativan was given to control the tremors Patient was sleepy on arrival  He has improved to some degree in the hospital, but will need initiation of dialysis this admit  Will have temp access placed on 10/25 and will probably have mapping for dialysis 10/26 and placement of access 10/28 Clip is not in process yet so we will plan for this  Assessment & Plan:   Principal Problem:   Tremors of nervous system Active Problems:   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   Ischemic cardiomyopathy   Essential hypertension   Type 2 diabetes mellitus with diabetic chronic kidney disease ([place])   Hyperkalemia   GERD (gastroesophageal reflux disease)   Myoclonic jerking   Hypocalcemia   CKD (chronic kidney disease), stage V (HCC)   Dehydration   Hyponatremia   Prolonged QT interval   Generalized weakness    Malnutrition of moderate degree   ESRD (end stage renal disease) ([place])   1. Myoclonic jerks with generalized weakness a. Myoclonus resolved-give low-dose Ativan 2/2 anxiety /jerks b. Asterixis is probably from azotemia and is improved c. Once dialysis is scheduled we can probably remove Ativan completely from [facility] in the outpatient setting with sent home with small amounts given some anxiety around initiation of dialysis 2. Chronic kidney disease stage V a. Nephrology consult appreciated planning for in-house initiation of dialysis b. Dialysis cath placed R chest Monday 10/25 c. Venous fistula mapping 10/26, likely placement fistulae later this week 10.28 3. Hypocalcemia hyponatremia hyperkalemia a. k improved-periodic lab work b. Phosphorus is improved from admission dialysis potassium c. continuing Sensipar 30 daily, calcitriol 0.1 Monday Wednesday Friday 4. Elevated total bili a. Unclear etiology, patient may have nonalcoholic steatohepatitis? Improved gradually over hosp stay b. Hepatitis panel is negative c. Rpt LFT in OP and work-up if approp 5. Prolonged QTC 504 a. Keep on monitors and try to avoid QT prolonging agents 6. DM TY 2 A1c 5.7 a. CBGs well-controlled therefore discontinued sliding scale and is back on home Januvia b. Just daily checks with labs 7. Reflux 8. History of CABG X4 a. Continue Imdur 30 daily Cardura 8 daily and hydralazine 75 3 times daily b. Some uncontrolled today c. Expect dialysis will help with high pressures 9. History of PVC NSVT a. Resumed amiodarone 200 daily for NSVT-monitor trends slightly bradycardic on monitors b. Would d/c monitors as this is chronic  DVT prophylaxis: On heparin Code Status: Full CODE STATUS Family Communication:  Disposition: Inpatient  Status is: Inpatient  Remains inpatient appropriate because:Persistent  severe electrolyte disturbances, Ongoing active pain requiring inpatient pain management, Altered mental status  and IV treatments appropriate due to intensity of illness or inability to take PO   Dispo: The patient is from: Home              Anticipated d/c is to: Unclear at this time              Anticipated d/c date is: > 3 days              Patient currently is not medically stable to d/c.  Consultants:   Nephrology  Procedures: None  Antimicrobials: No   Subjective:  About to go for venous mapping No other issues at this time Eating drinking   Objective: Vitals:   07/26/20 0639 07/26/20 0820 07/26/20 0850 07/26/20 0900  BP: (!) 176/71 (!) 167/68 (!) 191/62   Pulse: 61 (!) 37 63 (!) 42  Resp: 18 16    Temp: 98 F (36.7 C) 98 F (36.7 C)    TempSrc: Oral Oral    SpO2: 98% 98%    Weight:      Height:        Intake/Output Summary (Last 24 hours) at 07/26/2020 1037 Last data filed at 07/26/2020 0900 Gross per 24 hour  Intake 624 ml  Output 1170 ml  Net -546 ml   Filed Weights   07/20/20 1735 07/21/20 0200  Weight: 63.5 kg 61.4 kg    Examination:  Pleasant coherent no distress eomi ncat flat affect Fistula in place in R upper chest clean and dry dressing cta b no added sound   Data Reviewed: I have personally reviewed following labs and imaging studies  Sodium 133 Bun /creat 66/3.4 co2 22 Phos 4.5  Hemoglobin 11.4   Radiology Studies: DG Chest Port 1 View  Result Date: 07/25/2020 CLINICAL DATA:  Dialysis catheter placement EXAM: PORTABLE CHEST 1 VIEW COMPARISON:  07/25/2020 FINDINGS: The right IJ dialysis catheter is in good position without complicating features. The distal port is at the cavoatrial junction. The cardiac silhouette, mediastinal and hilar contours are within normal limits and stable. Stable surgical changes from triple bypass surgery. The lungs are clear. No pleural effusions or pulmonary edema. No worrisome pulmonary lesions. The bony thorax is intact. IMPRESSION: Right IJ dialysis catheter in good position without complicating features.  Electronically Signed   By: Marijo Sanes M.D.   On: 07/25/2020 14:37   DG Chest Port 1 View  Result Date: 07/25/2020 CLINICAL DATA:  Preop.  Status post coronary bypass graft. EXAM: PORTABLE CHEST 1 VIEW COMPARISON:  July 08, 2020. FINDINGS: The heart size and mediastinal contours are within normal limits. Both lungs are clear. The visualized skeletal structures are unremarkable. IMPRESSION: No active disease. Electronically Signed   By: Marijo Conception M.D.   On: 07/25/2020 10:17   DG C-Arm 1-60 Min-No Report  Result Date: 07/25/2020 Fluoroscopy was utilized by the requesting physician.  No radiographic interpretation.     Scheduled Meds: . amiodarone  200 mg Oral Daily  . aspirin EC  81 mg Oral Daily  . B-complex with vitamin C  1 tablet Oral Daily  . calcitRIOL  0.5 mcg Oral Q M,W,F  . chlorhexidine  15 mL Mouth/Throat Once  . Chlorhexidine Gluconate Cloth  6 each Topical Daily  . cinacalcet  30 mg Oral Q breakfast  . doxazosin  8 mg Oral Daily  . feeding supplement (NEPRO CARB STEADY)  237 mL Oral BID BM  .  heparin  5,000 Units Subcutaneous Q8H  . hydrALAZINE  75 mg Oral TID  . isosorbide mononitrate  30 mg Oral Daily  . linagliptin  5 mg Oral Daily  . pantoprazole  40 mg Oral BID   Continuous Infusions: . sodium chloride    . sodium chloride       LOS: 6 days    Time spent: [place], MD [facility] To contact the attending provider between 7A-7P or the covering provider during after hours 7P-7A, please log into the web site [email or web address] and access using universal [name] password for that web site. If you do not have the password, please call the hospital operator.  07/26/2020, 10:37 AM

## 2020-07-26 NOTE — TOC Initial Note (Signed)
Transition of Care [facility]) - Initial/Assessment Note   Patient Details  Name: Bradley Black MRN: [id number] Date of Birth: May 29, 1946  Transition of Care [facility]) CM/SW Contact:    Sherie Don, LCSW Phone Number: 07/26/2020, 4:45 PM  Clinical Narrative: Patient is a 74 year old male who was admitted for tremors of nervous system. Readmission checklist completed due to high readmission score. CSW spoke with wife regarding dialysis being set up prior to discharge. CLIP paperwork faxed to [facility]. TOC to follow.  Expected Discharge Plan: [place] Barriers to Discharge: Continued Medical Work up  Patient Goals and CMS Choice Patient states their goals for this hospitalization and ongoing recovery are:: Discharge home after dialysis is set up CMS Medicare.gov Compare Post Acute Care list provided to:: Patient Represenative (must comment) Bradley Black (wife)) Choice offered to / list presented to : Spouse  Expected Discharge Plan and Services Expected Discharge Plan: [place] In-house Referral: Clinical Social Work Discharge Planning Services: NA Post Acute Care Choice: [place] arrangements for the past 2 months: Single Family Home            DME Arranged: N/A DME Agency: NA  Prior Living Arrangements/Services Living arrangements for the past 2 months: Single Family Home Lives with:: Spouse Patient language and need for interpreter reviewed:: Yes Do you feel safe going back to the place where you live?: Yes      Need for Family Participation in Patient Care: Yes (Comment) (Patient has altered mental status) Care giver support system in place?: Yes (comment) Current home services: DME, Home PT [facility]) Criminal Activity/Legal Involvement Pertinent to Current Situation/Hospitalization: No - Comment as needed  Activities of Daily Living Home Assistive Devices/Equipment: Blood pressure cuff, CBG Meter ADL Screening (condition at time of  admission) Patient's cognitive ability adequate to safely complete daily activities?: Yes Is the patient deaf or have difficulty hearing?: No Does the patient have difficulty seeing, even when wearing glasses/contacts?: No Does the patient have difficulty concentrating, remembering, or making decisions?: No Patient able to express need for assistance with ADLs?: Yes Does the patient have difficulty dressing or bathing?: No Independently performs ADLs?: Yes (appropriate for developmental age) Does the patient have difficulty walking or climbing stairs?: No Weakness of Legs: Both Weakness of Arms/Hands: None  Permission Sought/Granted Permission sought to share information with : Chartered certified accountant granted to share information with : Yes, Verbal Permission Granted Permission granted to share info w AGENCY: [facility] dialysis  Emotional Assessment Appearance:: Appears stated age Orientation: : Oriented to Self, Oriented to Place, Oriented to  Time, Oriented to Situation Alcohol / Substance Use: Not Applicable Psych Involvement: No (comment)  Admission diagnosis:  Tremors of nervous system [R25.1] Renal failure, unspecified chronicity [N19] Patient Active Problem List   Diagnosis Date Noted  . ESRD (end stage renal disease) ([place])   . Malnutrition of moderate degree 07/22/2020  . CKD (chronic kidney disease), stage V ([place]) 07/21/2020  . Dehydration 07/21/2020  . Hyponatremia 07/21/2020  . Prolonged QT interval 07/21/2020  . Generalized weakness 07/21/2020  . Tremors of nervous system 07/20/2020  . Hypocalcemia   . Myoclonic jerking 07/18/2020  . Biventricular heart failure ([place])   . Frequent PVCs   . Acute renal failure superimposed on stage 4 chronic kidney disease ([place])   . SOB (shortness of breath) 07/08/2020  . Elevated LFTs 07/08/2020  . Left carotid artery stenosis   . IDA (iron deficiency anemia) 04/13/2020  .  GERD (gastroesophageal reflux disease)    . Essential hypertension 07/24/2019  . Anemia in chronic kidney disease 07/24/2019  . Chronic kidney disease, stage IV (severe) ([place]) 07/24/2019  . Hyperkalemia 07/24/2019  . 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 Type 2 diabetes mellitus with diabetic chronic kidney disease ([place]) 07/19/2016  . Cervicalgia 03/08/2014  . Whiplash injury to neck 03/08/2014  . Aortic heart murmur 10/18/2013  . Acute myocardial infarction, History of:   [name] Hyperlipidemia LDL goal <70   . Anemia 04/23/2013  . 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 Ischemic cardiomyopathy 11/30/2011  . Actinic keratosis, left scalp 10/04/2011  . Seborrheic keratosis, right lateral leg 10/04/2011   PCP:  Susy Frizzle, MD Pharmacy:   [place] [place] [facility] [address] [place] [address] Phone: [phone] Fax: [phone]  [place], [place] [address] [name] [address] Phone: [phone] Fax: [phone]  Readmission Risk Interventions Readmission Risk Prevention Plan 07/26/2020  Transportation Screening Complete  HRI or Home Care Consult Complete  Social Work Consult for [place] Planning/Counseling Complete  Medication Review Press photographer) Complete  Some recent data might be hidden

## 2020-07-26 NOTE — Progress Notes (Signed)
Nutrition Follow-up  DOCUMENTATION CODES:   Non-severe (moderate) malnutrition in context of chronic illness  INTERVENTION:  D/c Nepro d/t pt dislike (supplement not available in chocolate)  Continue B-complex with C daily  Encouraged po intake    NUTRITION DIAGNOSIS:   Moderate Malnutrition related to chronic illness (combined CHF; CKD stage V pending assess for intiation of HD) as evidenced by mild fat depletion, moderate fat depletion, mild muscle depletion, moderate muscle depletion, severe muscle depletion, percent weight loss. -ongoing  GOAL:   Patient will meet greater than or equal to 90% of their needs -progressing  MONITOR:   PO intake, Supplement acceptance, Weight trends, Labs, I & O's  REASON FOR ASSESSMENT:   Malnutrition Screening Tool    ASSESSMENT:   74 year old male with history significant for ischemic cardiomyopathy, CAD s/p CABG x4, combined CHF, DM2, diabetic retinopathy, CKD stage V, GERD, who was recently discharged on 10/19 after hospitalization for acute onset of uncontrollable tremors presented with continued shaking and jerking movements since returning home. Patient admitted for tremors of nervous system.  10/25- RIJ TDC placed; Net 500 ml  Patient underwent first HD treatment yesterday, noted to have tolerated well. Plans for placement of left arm AV fistula on 10/28, mapping likely to be completed today. Patient sitting up in bed, wife present in room this morning at RD follow-up. He reports feeling some better today and may be going home. Meal intake has varied, per flowsheets consuming 50-100%. Pt reports food is not that good, but eating well most of the time. He is not drinking Nepro, says he does not like vanilla flavor. Unfortunately, [facility] does not carry chocolate at this time, will discontinue supplement. RD discussed importance of adequate calorie/protein intake and encouraged po of meals.   I/Os: -1207 ml since admit UOP: 500 ml x 24  hrs  No new weights for review at this time.   Medications reviewed and include: B-complex with C, Calcitriol, Sensipar, Tradjenta, Protonix  Labs: CBGs 156,97,99, Na 133 (L), BUN 66 (H), Cr 3.45 (H), RBC 3.57 (L), Hgb 11.4 (L), HCT 34.6 (L)   Diet Order:   Diet Order            Diet renal/carb modified with fluid restriction Fluid restriction: 1200 mL Fluid; Room service appropriate? Yes; Fluid consistency: Thin  Diet effective now                 EDUCATION NEEDS:   Education needs have been addressed  Skin:  Skin Assessment:  (scattered ecchymosis; bilateral arm)  Last BM:  10/23  Height:   Ht Readings from Last 1 Encounters:  07/21/20 5\' 8"  (1.727 m)    Weight:   Wt Readings from Last 1 Encounters:  07/21/20 61.4 kg   BMI:  Body mass index is 20.58 kg/m.  Estimated Nutritional Needs:   Kcal:  1900-2100  Protein:  90-105  Fluid:  UOP + 1000 ml  Lajuan Lines, RD, LDN Clinical Nutrition After Hours/Weekend Pager # in [phone]

## 2020-07-26 NOTE — Progress Notes (Signed)
Patient ID: Bradley Black, male   DOB: 1946-01-04, 74 y.o.   MRN: [id number] The patient is posted for a left arm AV fistula on Thursday, October 28.  I discussed this with him yesterday.  Currently having dialysis via tunnel catheter placed yesterday with Dr. Constance Haw.

## 2020-07-26 NOTE — Procedures (Signed)
     HEMODIALYSIS TREATMENT NOTE (HD#2):  Uneventful 2.5 hour heparin-free HD session completed via RIJ TDC.  Catheter tolerated prescribed flow with stable pressures.  Goal met: 1 liter removed without interruption in ultrafiltration.  All blood was returned.   HBsAg level resulted, negative 07/23/20 HBsAb and HB Total Core Antibody level were drawn / sent to lab today.  Rockwell [place], RN

## 2020-07-27 ENCOUNTER — Other Ambulatory Visit: Payer: Self-pay | Admitting: *Deleted

## 2020-07-27 DIAGNOSIS — I2510 Atherosclerotic heart disease of native coronary artery without angina pectoris: Secondary | ICD-10-CM | POA: Diagnosis not present

## 2020-07-27 DIAGNOSIS — E860 Dehydration: Secondary | ICD-10-CM | POA: Diagnosis not present

## 2020-07-27 DIAGNOSIS — R251 Tremor, unspecified: Secondary | ICD-10-CM | POA: Diagnosis not present

## 2020-07-27 DIAGNOSIS — N186 End stage renal disease: Secondary | ICD-10-CM

## 2020-07-27 DIAGNOSIS — N185 Chronic kidney disease, stage 5: Secondary | ICD-10-CM | POA: Diagnosis not present

## 2020-07-27 LAB — GLUCOSE, CAPILLARY
Glucose-Capillary: 99 mg/dL (ref 70–99)
Glucose-Capillary: 153 mg/dL — ABNORMAL HIGH (ref 70–99)
Glucose-Capillary: 173 mg/dL — ABNORMAL HIGH (ref 70–99)

## 2020-07-27 MED ORDER — CEFAZOLIN SODIUM-DEXTROSE 2-4 GM/100ML-% IV SOLN
2.0000 g | INTRAVENOUS | Status: DC
  Filled 2020-07-27: qty 100

## 2020-07-27 NOTE — Progress Notes (Signed)
Patient ID: Bradley Black, male   DOB: [birth year], 74 y.o.   MRN: [id number] Surgery scheduled for tomorrow morning for left radiocephalic AV fistula.  N.p.o. orders, consent and antibiotic orders written

## 2020-07-27 NOTE — Patient Outreach (Addendum)
[facility]) Care Management  07/27/2020  Bradley Black 04/02/1946 [id number]   [facility] care coordination forpost hospitalTransition of Care Referral- Hospitalized  [name] was referred to [facility] on 07/14/20 by [place] hospital liaison Referral reason: Please refer to telephonic RN for post hospital follow up. Patient had been followed by [facility] prior to hospital admission.   ED on 07/20/20 with re admission for further shaking seen by nephrologist, Dr Theador Hawthorne, on 07/20/20 who felt pt need to return to ED work-up showed hemodynamically stable BUNs/creatinine 100/5.1 total bilirubin 1.7 potassium 5.2 Ativan was given to control the tremors 07/22/20 nephrology consult, agrees to start dialysis ED with Observation admission 07/18/20-07/19/20  for tremors, worsening kidney function dx myoclonic jerking  Admission 07/08/20 -07/13/20 acute on chronic combinedcongestive Heart Failure (CHF)in exacerbation   Insurance:[employer] [facility]) medicare   Pt  remains hospitalized   [facility] RN CM will follow up with pt after collaboration or update from [place]. Lavina Hamman, RN, BSN, [place] Coordinator Office number [phone] [facility] number [phone] Fax number [phone]

## 2020-07-27 NOTE — Progress Notes (Signed)
Patient ID: Bradley Black, male   DOB: 1945/10/31, 74 y.o.   MRN: [id number] S: Felt dizzy upon standing earlier this morning but better now. O:BP (!) 156/53 (BP Location: Right Arm)   Pulse 61 Comment: was retaken and manually counted  Temp 97.9 F (36.6 C)   Resp 16   Ht 5\' 8"  (1.727 m)   Wt 61.4 kg   SpO2 97%   BMI 20.58 kg/m   Intake/Output Summary (Last 24 hours) at 07/27/2020 1235 Last data filed at 07/27/2020 0446 Gross per 24 hour  Intake --  Output 1175 ml  Net -1175 ml   Intake/Output: I/O last 3 completed shifts: In: 240 [P.O.:240] Out: 2225 [Urine:725; Other:1500]  Intake/Output this shift:  No intake/output data recorded. Weight change:  Gen: NAD CVS: RRR Resp: cta Abd: +BS, soft, NT/ND Ext: no edema  Recent Labs  Lab 07/20/20 1915 07/21/20 0430 07/22/20 0621 07/24/20 0823 07/25/20 0611 07/25/20 1517 07/26/20 0444  NA 134* 138 135 133* 135 135 133*  K 5.2* 4.6 4.6 4.3 4.4 4.8 4.2  CL 97* 98 98 101 101 103 102  CO2 25 25 23  21* 23 20* 22  GLUCOSE 109* 94 100* 103* 102* 158* 99  BUN 100* 108* 108* 95* 95* 89* 66*  CREATININE 5.13* 5.00* 4.59* 4.22* 4.01* 3.97* 3.45*  ALBUMIN 3.8 3.7 3.6 3.7  --  3.6 3.5  CALCIUM 8.5* 8.8* 8.5* 8.9 9.1 9.0 8.9  PHOS  --  6.1* 5.8* 4.7*  --  4.6 4.5  AST 26 24  --   --   --   --   --   ALT 67* 61*  --   --   --   --   --    Liver Function Tests: Recent Labs  Lab 07/20/20 1915 07/20/20 1915 07/21/20 0430 07/22/20 0621 07/24/20 0823 07/25/20 1517 07/26/20 0444  AST 26  --  24  --   --   --   --   ALT 67*  --  61*  --   --   --   --   ALKPHOS 102  --  92  --   --   --   --   BILITOT 1.7*  --  2.0*  --   --   --   --   PROT 7.1  --  7.1  --   --   --   --   ALBUMIN 3.8   < > 3.7   < > 3.7 3.6 3.5   < > = values in this interval not displayed.   No results for input(s): LIPASE, AMYLASE in the last 168 hours. No results for input(s): AMMONIA in the last 168 hours. CBC: Recent Labs  Lab 07/21/20 0430  07/21/20 0430 07/22/20 0621 07/22/20 0621 07/25/20 0611 07/25/20 1517 07/26/20 0444  WBC 9.1   < > 9.6   < > 8.2 7.6 9.0  NEUTROABS  --   --  7.5  --  6.2  --  6.8  HGB 12.4*   < > 11.9*   < > 11.4* 11.7* 11.4*  HCT 37.6*   < > 36.3*   < > 35.6* 36.0* 34.6*  MCV 94.9  --  96.8  --  96.7 97.8 96.9  PLT 191   < > 201   < > 193 174 154   < > = values in this interval not displayed.   Cardiac Enzymes: No results for input(s): CKTOTAL, CKMB, CKMBINDEX, TROPONINI in  the last 168 hours. CBG: Recent Labs  Lab 07/26/20 1118 07/26/20 1622 07/26/20 2043 07/27/20 0720 07/27/20 1141  GLUCAP 156* 136* 141* 99 153*    Iron Studies: No results for input(s): IRON, TIBC, TRANSFERRIN, FERRITIN in the last 72 hours. Studies/Results: [place] UE VEIN MAPPING LEFT (PRE-OP AVF)  Result Date: 07/26/2020 CLINICAL DATA:  74 year old male presents for vascular mapping EXAM: [place] EXTREM UP VEIN MAPPING COMPARISON:  None. FINDINGS: RIGHT ARTERIES Wrist Radial Artery: Size 75mm Waveform triphasic Wrist Ulnar Artery: Size 30mm Waveform triphasic Prox. Forearm Radial Artery: Size 33mm Waveform triphasic Upper Arm Brachial Artery: Size 75mm Waveform triphasic RIGHT VEINS Forearm Cephalic Vein: Prox: IV present, distal 31mm Depth 48mm Upper Arm Cephalic Vein: Prox 59mm Distal 70mm Depth 60mm Upper Arm Basilic Vein: Prox 59mm Distal 44mm Depth 77mm Upper Arm Brachial Vein: Prox 20mm Distal 55mm Depth 23mm ADDITIONAL RIGHT VEINS Axillary Vein:  63mm Subclavian Vein: Patient: Yes respiratory Phasicity: Yes Internal Jugular Vein: Patent: Yes respiratory Phasicity: Yes LEFT ARTERIES Wrist Radial Artery: Size 31mm Waveform triphasic Wrist Ulnar Artery: Size 56mm Waveform triphasic Prox. Forearm Radial Artery: Size 38mm Waveform triphasic Upper Arm Brachial Artery: Size 36mm Waveform triphasic LEFT VEINS Forearm Cephalic Vein: Prox 28mm Distal 78mm Depth 50mm Upper Arm Cephalic Vein: Prox 72mm Distal 48mm Depth 52mm Upper Arm Basilic Vein: Prox 39mm Distal  63mm Depth 67mm Upper Arm Brachial Vein: Prox 57mm Distal 67mm Depth 43mm ADDITIONAL LEFT VEINS Axillary Vein:  16mm Subclavian Vein: Patient: Yes respiratory Phasicity: Yes Internal Jugular Vein: Patent: Yes respiratory Phasicity: Yes IMPRESSION: Bilateral upper extremity vascular mapping, as above. Signed, Dulcy Fanny. Dellia Nims, RPVI [facility] Radiology Electronically Signed   By: Corrie Mckusick D.O.   On: 07/26/2020 13:33   DG Chest Port 1 View  Result Date: 07/25/2020 CLINICAL DATA:  Dialysis catheter placement EXAM: PORTABLE CHEST 1 VIEW COMPARISON:  07/25/2020 FINDINGS: The right IJ dialysis catheter is in good position without complicating features. The distal port is at the cavoatrial junction. The cardiac silhouette, mediastinal and hilar contours are within normal limits and stable. Stable surgical changes from triple bypass surgery. The lungs are clear. No pleural effusions or pulmonary edema. No worrisome pulmonary lesions. The bony thorax is intact. IMPRESSION: Right IJ dialysis catheter in good position without complicating features. Electronically Signed   By: Marijo Sanes M.D.   On: 07/25/2020 14:37   [place] UE VEIN MAPPING RIGHT (PRE-OP AVF)  Result Date: 07/26/2020 CLINICAL DATA:  74 year old male presents for vascular mapping EXAM: [place] EXTREM UP VEIN MAPPING COMPARISON:  None. FINDINGS: RIGHT ARTERIES Wrist Radial Artery: Size 32mm Waveform triphasic Wrist Ulnar Artery: Size 16mm Waveform triphasic Prox. Forearm Radial Artery: Size 76mm Waveform triphasic Upper Arm Brachial Artery: Size 28mm Waveform triphasic RIGHT VEINS Forearm Cephalic Vein: Prox: IV present, distal 43mm Depth 60mm Upper Arm Cephalic Vein: Prox 66mm Distal 68mm Depth 60mm Upper Arm Basilic Vein: Prox 78mm Distal 81mm Depth 7mm Upper Arm Brachial Vein: Prox 26mm Distal 86mm Depth 61mm ADDITIONAL RIGHT VEINS Axillary Vein:  34mm Subclavian Vein: Patient: Yes respiratory Phasicity: Yes Internal  Jugular Vein: Patent: Yes respiratory Phasicity: Yes LEFT ARTERIES Wrist Radial Artery: Size 32mm Waveform triphasic Wrist Ulnar Artery: Size 2mm Waveform triphasic Prox. Forearm Radial Artery: Size 43mm Waveform triphasic Upper Arm Brachial Artery: Size 24mm Waveform triphasic LEFT VEINS Forearm Cephalic Vein: Prox 38mm Distal 96mm Depth 54mm Upper Arm Cephalic Vein: Prox 46mm Distal 51mm Depth 62mm Upper Arm Basilic Vein: Prox 73mm  Distal 62mm Depth 69mm Upper Arm Brachial Vein: Prox 36mm Distal 9mm Depth 65mm ADDITIONAL LEFT VEINS Axillary Vein:  41mm Subclavian Vein: Patient: Yes respiratory Phasicity: Yes Internal Jugular Vein: Patent: Yes respiratory Phasicity: Yes IMPRESSION: Bilateral upper extremity vascular mapping, as above. Signed, Dulcy Fanny. Dellia Nims, RPVI [facility] Radiology Electronically Signed   By: Corrie Mckusick D.O.   On: 07/26/2020 13:33   DG C-Arm 1-60 Min-No Report  Result Date: 07/25/2020 Fluoroscopy was utilized by the requesting physician.  No radiographic interpretation.   [name] amiodarone  200 mg Oral Daily  . aspirin EC  81 mg Oral Daily  . B-complex with vitamin C  1 tablet Oral Daily  . calcitRIOL  0.5 mcg Oral Q M,W,F  . chlorhexidine  15 mL Mouth/Throat Once  . Chlorhexidine Gluconate Cloth  6 each Topical Daily  . cinacalcet  30 mg Oral Q breakfast  . doxazosin  8 mg Oral Daily  . heparin  5,000 Units Subcutaneous Q8H  . hydrALAZINE  75 mg Oral TID  . isosorbide mononitrate  30 mg Oral Daily  . linagliptin  5 mg Oral Daily  . pantoprazole  40 mg Oral BID    BMET    Component Value Date/Time   NA 133 (L) 07/26/2020 0444   K 4.2 07/26/2020 0444   CL 102 07/26/2020 0444   CO2 22 07/26/2020 0444   GLUCOSE 99 07/26/2020 0444   BUN 66 (H) 07/26/2020 0444   CREATININE 3.45 (H) 07/26/2020 0444   CREATININE 2.84 (H) 05/01/2019 0945   CALCIUM 8.9 07/26/2020 0444   GFRNONAA 18 (L) 07/26/2020 0444   GFRNONAA 21 (L) 05/01/2019  0945   GFRAA 24 (L) 05/01/2019 0945   CBC    Component Value Date/Time   WBC 9.0 07/26/2020 0444   RBC 3.57 (L) 07/26/2020 0444   HGB 11.4 (L) 07/26/2020 0444   HCT 34.6 (L) 07/26/2020 0444   PLT 154 07/26/2020 0444   MCV 96.9 07/26/2020 0444   MCH 31.9 07/26/2020 0444   MCHC 32.9 07/26/2020 0444   RDW 13.7 07/26/2020 0444   LYMPHSABS 1.0 07/26/2020 0444   MONOABS 0.8 07/26/2020 0444   EOSABS 0.3 07/26/2020 0444   BASOSABS 0.0 07/26/2020 0444     Assessment/Plan:  1. ESRD- new start to HD and for 3rd HD session tomorrow afternoon (after AVF/AVG placement) 1. For AVF/AVG creation 07/28/20 2. Plan for HD after surgery tomorrow afternoon 3. Awaiting outpatient HD spot with [facility]  (has been followed by Dr. Theador Hawthorne as an outpatient) 2. CAD s/p CABG- stable 3. 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  Currently euvolemic. 4. PVC's-  stable 5. DM type 2- per primary 6. Anemia of CKD- stable 7. Prolonged QT- per primary 8. Deconditioning - working with PT/OT and per SW will go home with [place], MD [employer] [phone]

## 2020-07-27 NOTE — Progress Notes (Signed)
PROGRESS NOTE    Bradley Black  IZT:[id number] DOB: 1945-10-29 DOA: 07/20/2020 PCP: Susy Frizzle, MD   Brief Narrative: Bradley Black is a 74 y.o. male with a history of MI s/p PCI, CABG, carotid artery stenosis, essential hypertension, diabetes mellitus, type 2, CKD 5, PVCs, NSVT, BPH. Patient presented secondary to jerking motions thoguht to be myoclonus from azotemia. Patient started on HD.   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 Resolved with HD.  ESRD New diagnosis. New hemodialysis. Working on outpatient HD placement -Nephrology recommendations: HD inpatient, Sensipar, calcitriol  Hyponatremia In setting of worsened kidney function. Improved with HD. Mild and mostly resolved.  Hyperbilirubinemia Chronic. Recent ultrasound without evidence of cirrhosis. Stable. Outpatient follow-up  Prolonged QTc -Avoid QT prolonging medications  Diabetes mellitus, type 2 Patient is on Januvia as an outpatient -Continue Tradjenta (inpatient)  History of CABG CAD -Continue Imdur, doxazosin, hydralazine  Chronic heart heart failure  History of PVCs/NSVT -Continue amiodarone 200 mg daily   DVT prophylaxis: Heparin subq Code Status:   Code Status: Full Code Family Communication: Wife at bedside Disposition Plan: Discharge likely in 2+ days pending vascular surgery management in addition to outpatient HD  placement   Consultants:   Nephrology  Vascular surgery  Procedures:   HD  Antimicrobials:  Cefazolin    Subjective: Trouble sleeping because of noise and thoughts about medical issues.  Objective: Vitals:   07/26/20 1830 07/26/20 1900 07/26/20 2043 07/27/20 0539  BP: 112/61 116/70 (!) 149/52 (!) 156/53  Pulse: (!) 57 62 (!) 45 61  Resp:   16 16  Temp:   98.2 F (36.8 C) 97.9 F (36.6 C)  TempSrc:   Oral   SpO2:   97% 97%  Weight:      Height:        Intake/Output Summary (Last 24 hours) at 07/27/2020 1145 Last data filed at 07/27/2020 0446 Gross per 24 hour  Intake --  Output 1175 ml  Net -1175 ml   Filed Weights   07/20/20 1735 07/21/20 0200  Weight: 63.5 kg 61.4 kg    Examination:  General exam: Appears calm and comfortable Respiratory system: Clear to auscultation. Respiratory effort normal. Cardiovascular system: S1 & S2 heard, RRR. No murmurs, rubs, gallops or clicks. Gastrointestinal system: Abdomen is nondistended, soft and nontender. No organomegaly or masses felt. Normal bowel sounds heard. Central nervous system: Alert and oriented. No focal neurological deficits. Musculoskeletal: No edema. No calf tenderness Skin: No cyanosis. No rashes Psychiatry: Judgement and insight appear normal. Mood & affect appropriate.     Data Reviewed: I have personally reviewed following labs and imaging studies  CBC Lab Results  Component Value Date   WBC 9.0 07/26/2020   RBC 3.57 (L) 07/26/2020   HGB 11.4 (L) 07/26/2020   HCT 34.6 (L) 07/26/2020   MCV 96.9 07/26/2020   MCH 31.9 07/26/2020   PLT  154 07/26/2020   MCHC 32.9 07/26/2020   RDW 13.7 07/26/2020   LYMPHSABS 1.0 07/26/2020   MONOABS 0.8 07/26/2020   EOSABS 0.3 07/26/2020   BASOSABS 0.0 49/67/5916     Last metabolic panel Lab Results  Component Value Date   NA 133 (L) 07/26/2020   K 4.2 07/26/2020   CL 102 07/26/2020   CO2 22 07/26/2020   BUN 66 (H) 07/26/2020   CREATININE 3.45  (H) 07/26/2020   GLUCOSE 99 07/26/2020   GFRNONAA 18 (L) 07/26/2020   GFRAA 24 (L) 05/01/2019   CALCIUM 8.9 07/26/2020   PHOS 4.5 07/26/2020   PROT 7.1 07/21/2020   ALBUMIN 3.5 07/26/2020   BILITOT 2.0 (H) 07/21/2020   ALKPHOS 92 07/21/2020   AST 24 07/21/2020   ALT 61 (H) 07/21/2020   ANIONGAP 9 07/26/2020    CBG (last 3)  Recent Labs    07/26/20 2043 07/27/20 0720 07/27/20 1141  GLUCAP 141* 99 153*     GFR: Estimated Creatinine Clearance: 15.8 mL/min (A) (by C-G formula based on SCr of 3.45 mg/dL (H)).  Coagulation Profile: Recent Labs  Lab 07/21/20 0430  INR 1.2    Recent Results (from the past 240 hour(s))  Respiratory Panel by RT PCR (Flu A&B, Covid) - Nasopharyngeal Swab     Status: None   Collection Time: 07/20/20  9:42 PM   Specimen: Nasopharyngeal Swab  Result Value Ref Range Status   SARS Coronavirus 2 by RT PCR NEGATIVE NEGATIVE Final    Comment: (NOTE) SARS-CoV-2 target nucleic acids are NOT DETECTED.  The SARS-CoV-2 RNA is generally detectable in upper respiratoy specimens during the acute phase of infection. The lowest concentration of SARS-CoV-2 viral copies this assay can detect is 131 copies/mL. A negative result does not preclude SARS-Cov-2 infection and should not be used as the sole basis for treatment or other patient management decisions. A negative result may occur with  improper specimen collection/handling, submission of specimen other than nasopharyngeal swab, presence of viral mutation(s) within the areas targeted by this assay, and inadequate number of viral copies (<131 copies/mL). A negative result must be combined with clinical observations, patient history, and epidemiological information. The expected result is Negative.  Fact Sheet for Patients:  PinkCheek.be  Fact Sheet for Healthcare Providers:  GravelBags.it  This test is no t yet approved or cleared by the  [place] FDA and  has been authorized for detection and/or diagnosis of SARS-CoV-2 by FDA under an Emergency Use Authorization (EUA). This EUA will remain  in effect (meaning this test can be used) for the duration of the COVID-19 declaration under Section 564(b)(1) of the Act, 21 U.S.C. section 360bbb-3(b)(1), unless the authorization is terminated or revoked sooner.     Influenza A by PCR NEGATIVE NEGATIVE Final   Influenza B by PCR NEGATIVE NEGATIVE Final    Comment: (NOTE) The Xpert Xpress SARS-CoV-2/FLU/RSV assay is intended as an aid in  the diagnosis of influenza from Nasopharyngeal swab specimens and  should not be used as a sole basis for treatment. Nasal washings and  aspirates are unacceptable for Xpert Xpress SARS-CoV-2/FLU/RSV  testing.  Fact Sheet for Patients: PinkCheek.be  Fact Sheet for Healthcare Providers: GravelBags.it  This test is not yet approved or cleared by the [place] FDA and  has been authorized for detection and/or diagnosis of SARS-CoV-2 by  FDA under an Emergency Use Authorization (EUA). This EUA will remain  in effect (meaning this test can be used) for the duration  of the  Covid-19 declaration under Section 564(b)(1) of the Act, 21  U.S.C. section 360bbb-3(b)(1), unless the authorization is  terminated or revoked. Performed at [facility], [address], [place], [place] [address]   MRSA PCR Screening     Status: None   Collection Time: 07/21/20  2:07 AM   Specimen: Nasopharyngeal  Result Value Ref Range Status   MRSA by PCR NEGATIVE NEGATIVE Final    Comment:        The GeneXpert MRSA Assay (FDA approved for NASAL specimens only), is one component of a comprehensive MRSA colonization surveillance program. It is not intended to diagnose MRSA infection nor to guide or monitor treatment for MRSA infections. Performed at [facility], [address] [place], [place]  [address]   Surgical pcr screen     Status: None   Collection Time: 07/25/20  6:30 AM   Specimen: Nasal Mucosa; Nasal Swab  Result Value Ref Range Status   MRSA, PCR NEGATIVE NEGATIVE Final   Staphylococcus aureus NEGATIVE NEGATIVE Final    Comment: (NOTE) The Xpert SA Assay (FDA approved for NASAL specimens in patients 40 years of age and older), is one component of a comprehensive surveillance program. It is not intended to diagnose infection nor to guide or monitor treatment. Performed at [facility], [address], [place], [place] [address]         Radiology Studies: [place] UE VEIN MAPPING LEFT (PRE-OP AVF)  Result Date: 07/26/2020 CLINICAL DATA:  74 year old male presents for vascular mapping EXAM: [place] EXTREM UP VEIN MAPPING COMPARISON:  None. FINDINGS: RIGHT ARTERIES Wrist Radial Artery: Size 53mm Waveform triphasic Wrist Ulnar Artery: Size 82mm Waveform triphasic Prox. Forearm Radial Artery: Size 22mm Waveform triphasic Upper Arm Brachial Artery: Size 15mm Waveform triphasic RIGHT VEINS Forearm Cephalic Vein: Prox: IV present, distal 95mm Depth 68mm Upper Arm Cephalic Vein: Prox 45mm Distal 21mm Depth 43mm Upper Arm Basilic Vein: Prox 56mm Distal 89mm Depth 27mm Upper Arm Brachial Vein: Prox 74mm Distal 28mm Depth 42mm ADDITIONAL RIGHT VEINS Axillary Vein:  54mm Subclavian Vein: Patient: Yes respiratory Phasicity: Yes Internal Jugular Vein: Patent: Yes respiratory Phasicity: Yes LEFT ARTERIES Wrist Radial Artery: Size 15mm Waveform triphasic Wrist Ulnar Artery: Size 28mm Waveform triphasic Prox. Forearm Radial Artery: Size 51mm Waveform triphasic Upper Arm Brachial Artery: Size 54mm Waveform triphasic LEFT VEINS Forearm Cephalic Vein: Prox 44mm Distal 18mm Depth 32mm Upper Arm Cephalic Vein: Prox 5mm Distal 49mm Depth 39mm Upper Arm Basilic Vein: Prox 26mm Distal 59mm Depth 59mm Upper Arm Brachial Vein: Prox 13mm Distal 51mm Depth 44mm ADDITIONAL LEFT VEINS Axillary Vein:  24mm Subclavian Vein: Patient: Yes respiratory  Phasicity: Yes Internal Jugular Vein: Patent: Yes respiratory Phasicity: Yes IMPRESSION: Bilateral upper extremity vascular mapping, as above. Signed, Dulcy Fanny. Dellia Nims, RPVI [facility] Radiology Special[facility] Radiology Electronically Signed   By: Corrie Mckusick D.O.   On: 07/26/2020 13:33   DG Chest Port 1 View  Result Date: 07/25/2020 CLINICAL DATA:  Dialysis catheter placement EXAM: PORTABLE CHEST 1 VIEW COMPARISON:  07/25/2020 FINDINGS: The right IJ dialysis catheter is in good position without complicating features. The distal port is at the cavoatrial junction. The cardiac silhouette, mediastinal and hilar contours are within normal limits and stable. Stable surgical changes from triple bypass surgery. The lungs are clear. No pleural effusions or pulmonary edema. No worrisome pulmonary lesions. The bony thorax is intact. IMPRESSION: Right IJ dialysis catheter in good position without complicating features. Electronically Signed   By:  Marijo Sanes M.D.   On: 07/25/2020 14:37   [place] UE VEIN MAPPING RIGHT (PRE-OP AVF)  Result Date: 07/26/2020 CLINICAL DATA:  74 year old male presents for vascular mapping EXAM: [place] EXTREM UP VEIN MAPPING COMPARISON:  None. FINDINGS: RIGHT ARTERIES Wrist Radial Artery: Size 58mm Waveform triphasic Wrist Ulnar Artery: Size 45mm Waveform triphasic Prox. Forearm Radial Artery: Size 47mm Waveform triphasic Upper Arm Brachial Artery: Size 37mm Waveform triphasic RIGHT VEINS Forearm Cephalic Vein: Prox: IV present, distal 103mm Depth 60mm Upper Arm Cephalic Vein: Prox 82mm Distal 23mm Depth 65mm Upper Arm Basilic Vein: Prox 74mm Distal 19mm Depth 34mm Upper Arm Brachial Vein: Prox 6mm Distal 80mm Depth 88mm ADDITIONAL RIGHT VEINS Axillary Vein:  56mm Subclavian Vein: Patient: Yes respiratory Phasicity: Yes Internal Jugular Vein: Patent: Yes respiratory Phasicity: Yes LEFT ARTERIES Wrist Radial Artery: Size 65mm Waveform triphasic Wrist Ulnar Artery: Size 10mm Waveform  triphasic Prox. Forearm Radial Artery: Size 85mm Waveform triphasic Upper Arm Brachial Artery: Size 75mm Waveform triphasic LEFT VEINS Forearm Cephalic Vein: Prox 105mm Distal 56mm Depth 16mm Upper Arm Cephalic Vein: Prox 109mm Distal 61mm Depth 68mm Upper Arm Basilic Vein: Prox 29mm Distal 41mm Depth 56mm Upper Arm Brachial Vein: Prox 21mm Distal 73mm Depth 27mm ADDITIONAL LEFT VEINS Axillary Vein:  61mm Subclavian Vein: Patient: Yes respiratory Phasicity: Yes Internal Jugular Vein: Patent: Yes respiratory Phasicity: Yes IMPRESSION: Bilateral upper extremity vascular mapping, as above. Signed, Dulcy Fanny. Dellia Nims, RPVI [facility] Inter[facility] Radiology Specialists [facility] Radiology Electronically Signed   By: Corrie Mckusick D.O.   On: 07/26/2020 13:33   DG C-Arm 1-60 Min-No Report  Result Date: 07/25/2020 Fluoroscopy was utilized by the requesting physician.  No radiographic interpretation.        Scheduled Meds: . amiodarone  200 mg Oral Daily  . aspirin EC  81 mg Oral Daily  . B-complex with vitamin C  1 tablet Oral Daily  . calcitRIOL  0.5 mcg Oral Q M,W,F  . chlorhexidine  15 mL Mouth/Throat Once  . Chlorhexidine Gluconate Cloth  6 each Topical Daily  . cinacalcet  30 mg Oral Q breakfast  . doxazosin  8 mg Oral Daily  . heparin  5,000 Units Subcutaneous Q8H  . hydrALAZINE  75 mg Oral TID  . isosorbide mononitrate  30 mg Oral Daily  . linagliptin  5 mg Oral Daily  . pantoprazole  40 mg Oral BID   Continuous Infusions: . sodium chloride    . sodium chloride    . [START ON 07/28/2020]  ceFAZolin (ANCEF) IV       LOS: 7 days     Cordelia Poche, MD [facility] 07/27/2020, 11:45 AM  If 7PM-7AM, please contact night-coverage [email or web address]

## 2020-07-27 NOTE — TOC Progression Note (Signed)
Transition of Care [facility]) - Progression Note   Patient Details  Name: Bradley Black MRN: [id number] Date of Birth: 30-Dec-1945  Transition of Care [facility]) CM/SW [place], LCSW Phone Number: 07/27/2020, 11:26 AM  Clinical Narrative: CSW faxed additional clinicals to [facility] and confirmed receipt with Shay in admissions. [facility] currently reviewing clinicals. TOC to follow.  Expected Discharge Plan: [place] Barriers to Discharge: Continued Medical Work up  Expected Discharge Plan and Services Expected Discharge Plan: [place] In-house Referral: Clinical Social Work Discharge Planning Services: NA Post Acute Care Choice: [place] arrangements for the past 2 months: Single Family Home             DME Arranged: N/A DME Agency: NA  Readmission Risk Interventions Readmission Risk Prevention Plan 07/26/2020  Transportation Screening Complete  HRI or Home Care Consult Complete  Social Work Consult for [place] Planning/Counseling Complete  Medication Review [employer] photographer) Complete  Some recent data might be hidden

## 2020-07-28 ENCOUNTER — Encounter (HOSPITAL_COMMUNITY): Payer: Self-pay | Admitting: Internal Medicine

## 2020-07-28 ENCOUNTER — Inpatient Hospital Stay (HOSPITAL_COMMUNITY): Payer: Medicare Other | Admitting: Anesthesiology

## 2020-07-28 ENCOUNTER — Encounter (HOSPITAL_COMMUNITY): Payer: Medicare Other | Admitting: Internal Medicine

## 2020-07-28 ENCOUNTER — Encounter (HOSPITAL_COMMUNITY)
Admission: EM | Disposition: A | Discharge status: Home Health | Payer: Self-pay | Source: Home / Self Care | Attending: Family Medicine

## 2020-07-28 HISTORY — PX: AV FISTULA PLACEMENT: SHX1204

## 2020-07-28 LAB — RENAL FUNCTION PANEL
Albumin: 3.4 g/dL — ABNORMAL LOW (ref 3.5–5.0)
Anion gap: 11 (ref 5–15)
BUN: 77 mg/dL — ABNORMAL HIGH (ref 8–23)
CO2: 24 mmol/L (ref 22–32)
Calcium: 9 mg/dL (ref 8.9–10.3)
Chloride: 98 mmol/L (ref 98–111)
Creatinine, Ser: 4.54 mg/dL — ABNORMAL HIGH (ref 0.61–1.24)
GFR, Estimated: 13 mL/min — ABNORMAL LOW (ref >60)
Glucose, Bld: 125 mg/dL — ABNORMAL HIGH (ref 70–99)
Phosphorus: 4.9 mg/dL — ABNORMAL HIGH (ref 2.5–4.6)
Potassium: 4.2 mmol/L (ref 3.5–5.1)
Sodium: 133 mmol/L — ABNORMAL LOW (ref 135–145)

## 2020-07-28 LAB — CBC
HCT: 34.3 % — ABNORMAL LOW (ref 39.0–52.0)
Hemoglobin: 11.3 g/dL — ABNORMAL LOW (ref 13.0–17.0)
MCH: 31.9 pg (ref 26.0–34.0)
MCHC: 32.9 g/dL (ref 30.0–36.0)
MCV: 96.9 fL (ref 80.0–100.0)
Platelets: 128 10*3/uL — ABNORMAL LOW (ref 150–400)
RBC: 3.54 MIL/uL — ABNORMAL LOW (ref 4.22–5.81)
RDW: 13.8 % (ref 11.5–15.5)
WBC: 8.9 10*3/uL (ref 4.0–10.5)
nRBC: 0 % (ref 0.0–0.2)

## 2020-07-28 LAB — HEPATITIS B SURFACE ANTIBODY, QUANTITATIVE: Hep B S AB Quant (Post): <3.1 m[IU]/mL — ABNORMAL LOW (ref >9.9)

## 2020-07-28 LAB — GLUCOSE, CAPILLARY
Glucose-Capillary: 126 mg/dL — ABNORMAL HIGH (ref 70–99)
Glucose-Capillary: 118 mg/dL — ABNORMAL HIGH (ref 70–99)

## 2020-07-28 SURGERY — ARTERIOVENOUS (AV) FISTULA CREATION
Anesthesia: Monitor Anesthesia Care | Site: Arm Lower | Laterality: Left

## 2020-07-28 MED ORDER — LIDOCAINE 2% (20 MG/ML) 5 ML SYRINGE
INTRAMUSCULAR | Status: AC
  Filled 2020-07-28: qty 5

## 2020-07-28 MED ORDER — LACTATED RINGERS IV SOLN: INTRAVENOUS | Status: DC

## 2020-07-28 MED ORDER — HEPARIN SODIUM (PORCINE) 1000 UNIT/ML IJ SOLN
INTRAMUSCULAR | Status: AC
  Filled 2020-07-28: qty 6

## 2020-07-28 MED ORDER — LIDOCAINE-EPINEPHRINE 0.5 %-1:200000 IJ SOLN
INTRAMUSCULAR | Status: DC | PRN
  Administered 2020-07-28: 4 mL

## 2020-07-28 MED ORDER — LIDOCAINE HCL (CARDIAC) PF 50 MG/5ML IV SOSY
PREFILLED_SYRINGE | INTRAVENOUS | Status: DC | PRN
  Administered 2020-07-28: 60 mg via INTRAVENOUS

## 2020-07-28 MED ORDER — DROPERIDOL 2.5 MG/ML IJ SOLN: 0.6250 mg | Freq: Once | INTRAMUSCULAR | Status: DC | PRN

## 2020-07-28 MED ORDER — CHLORHEXIDINE GLUCONATE 0.12 % MT SOLN
15.0000 mL | Freq: Once | OROMUCOSAL | Status: AC
  Administered 2020-07-28: 15 mL via OROMUCOSAL

## 2020-07-28 MED ORDER — CEFAZOLIN SODIUM-DEXTROSE 2-4 GM/100ML-% IV SOLN
2.0000 g | INTRAVENOUS | Status: AC
  Filled 2020-07-28: qty 100

## 2020-07-28 MED ORDER — ORAL CARE MOUTH RINSE: 15.0000 mL | Freq: Once | OROMUCOSAL | Status: AC

## 2020-07-28 MED ORDER — PROPOFOL 10 MG/ML IV BOLUS
INTRAVENOUS | Status: AC
  Filled 2020-07-28: qty 20

## 2020-07-28 MED ORDER — LIDOCAINE-EPINEPHRINE 0.5 %-1:200000 IJ SOLN
INTRAMUSCULAR | Status: AC
  Filled 2020-07-28: qty 1

## 2020-07-28 MED ORDER — ACETAMINOPHEN 10 MG/ML IV SOLN: 1000.0000 mg | Freq: Once | INTRAVENOUS | Status: DC | PRN

## 2020-07-28 MED ORDER — 0.9 % SODIUM CHLORIDE (POUR BTL) OPTIME
TOPICAL | Status: DC | PRN
  Administered 2020-07-28: 1000 mL

## 2020-07-28 MED ORDER — PROPOFOL 500 MG/50ML IV EMUL
INTRAVENOUS | Status: DC | PRN
  Administered 2020-07-28: 30 mg via INTRAVENOUS
  Administered 2020-07-28: 50 ug/kg/min via INTRAVENOUS

## 2020-07-28 MED ORDER — ONDANSETRON HCL 4 MG/2ML IJ SOLN: 4.0000 mg | Freq: Once | INTRAMUSCULAR | Status: DC | PRN

## 2020-07-28 MED ORDER — OXYCODONE HCL 5 MG/5ML PO SOLN: 5.0000 mg | Freq: Once | ORAL | Status: DC | PRN

## 2020-07-28 MED ORDER — SODIUM CHLORIDE 0.9 % IV SOLN: INTRAVENOUS | Status: DC

## 2020-07-28 MED ORDER — HYDROMORPHONE HCL 1 MG/ML IJ SOLN: 0.2500 mg | INTRAMUSCULAR | Status: DC | PRN

## 2020-07-28 MED ORDER — MEPERIDINE HCL 50 MG/ML IJ SOLN: 6.2500 mg | INTRAMUSCULAR | Status: DC | PRN

## 2020-07-28 MED ORDER — SODIUM CHLORIDE 0.9 % IV SOLN: 100.0000 mL | INTRAVENOUS | Status: DC | PRN

## 2020-07-28 MED ORDER — SODIUM CHLORIDE 0.9 % IV SOLN
INTRAVENOUS | Status: DC | PRN
  Administered 2020-07-28: 500 mL

## 2020-07-28 MED ORDER — OXYCODONE HCL 5 MG PO TABS: 5.0000 mg | ORAL_TABLET | Freq: Once | ORAL | Status: DC | PRN

## 2020-07-28 SURGICAL SUPPLY — 42 items
ADH SKN CLS APL DERMABOND .7 (GAUZE/BANDAGES/DRESSINGS) ×1
ARMBAND PINK RESTRICT EXTREMIT (MISCELLANEOUS) ×2 IMPLANT
BAG HAMPER (MISCELLANEOUS) ×2 IMPLANT
CANNULA VESSEL 3MM 2 BLNT TIP (CANNULA) ×2 IMPLANT
CLIP LIGATING EXTRA MED SLVR (CLIP) ×2 IMPLANT
CLIP LIGATING EXTRA SM BLUE (MISCELLANEOUS) ×2 IMPLANT
COVER LIGHT HANDLE STERIS (MISCELLANEOUS) ×4 IMPLANT
COVER MAYO STAND XLG (MISCELLANEOUS) ×2 IMPLANT
COVER PROBE W GEL 5X96 (DRAPES) ×1 IMPLANT
COVER WAND RF STERILE (DRAPES) ×2 IMPLANT
DECANTER SPIKE VIAL GLASS SM (MISCELLANEOUS) ×2 IMPLANT
DERMABOND ADVANCED (GAUZE/BANDAGES/DRESSINGS) ×1
DERMABOND ADVANCED .7 DNX12 (GAUZE/BANDAGES/DRESSINGS) ×1 IMPLANT
ELECT REM PT RETURN 9FT ADLT (ELECTROSURGICAL) ×2
ELECTRODE REM PT RTRN 9FT ADLT (ELECTROSURGICAL) ×1 IMPLANT
GAUZE SPONGE 4X4 12PLY STRL (GAUZE/BANDAGES/DRESSINGS) ×2 IMPLANT
GLOVE BIOGEL PI IND STRL 7.0 (GLOVE) ×3 IMPLANT
GLOVE BIOGEL PI INDICATOR 7.0 (GLOVE) ×3
GLOVE SS BIOGEL STRL SZ 7.5 (GLOVE) ×1 IMPLANT
GLOVE SUPERSENSE BIOGEL SZ 7.5 (GLOVE) ×1
GLOVE SURG SS PI 6.5 STRL IVOR (GLOVE) ×2 IMPLANT
GOWN STRL REUS W/TWL LRG LVL3 (GOWN DISPOSABLE) ×6 IMPLANT
IV NS 500ML (IV SOLUTION) ×2
IV NS 500ML BAXH (IV SOLUTION) ×1 IMPLANT
KIT BLADEGUARD II DBL (SET/KITS/TRAYS/PACK) ×2 IMPLANT
KIT TURNOVER KIT A (KITS) ×2 IMPLANT
MANIFOLD NEPTUNE II (INSTRUMENTS) ×2 IMPLANT
MARKER SKIN DUAL TIP RULER LAB (MISCELLANEOUS) ×4 IMPLANT
NDL HYPO 18GX1.5 BLUNT FILL (NEEDLE) IMPLANT
NEEDLE HYPO 18GX1.5 BLUNT FILL (NEEDLE) ×2 IMPLANT
NS IRRIG 1000ML POUR BTL (IV SOLUTION) ×2 IMPLANT
PACK CV ACCESS (CUSTOM PROCEDURE TRAY) ×2 IMPLANT
PAD ARMBOARD 7.5X6 YLW CONV (MISCELLANEOUS) ×4 IMPLANT
SET BASIN LINEN APH (SET/KITS/TRAYS/PACK) ×2 IMPLANT
SOL PREP POV-IOD 4OZ 10% (MISCELLANEOUS) ×2 IMPLANT
SOL PREP PROV IODINE SCRUB 4OZ (MISCELLANEOUS) ×2 IMPLANT
SUT PROLENE 6 0 CC (SUTURE) ×2 IMPLANT
SUT VIC AB 3-0 SH 27 (SUTURE) ×2
SUT VIC AB 3-0 SH 27X BRD (SUTURE) ×1 IMPLANT
SYR 10ML LL (SYRINGE) ×1 IMPLANT
SYR BULB IRRIG 60ML STRL (SYRINGE) ×1 IMPLANT
UNDERPAD 30X36 HEAVY ABSORB (UNDERPADS AND DIAPERS) ×2 IMPLANT

## 2020-07-28 NOTE — Progress Notes (Signed)
Patient ID: Bradley Black, male   DOB: 12/28/1945, 74 y.o.   MRN: [id number] S: Feels well, no complaints O:BP (!) 149/91   Pulse (!) 55   Temp (!) 97.5 F (36.4 C)   Resp 18   Ht 5\' 8"  (1.727 m)   Wt 61.4 kg   SpO2 96%   BMI 20.58 kg/m   Intake/Output Summary (Last 24 hours) at 07/28/2020 1216 Last data filed at 07/27/2020 2200 Gross per 24 hour  Intake 480 ml  Output 300 ml  Net 180 ml   Intake/Output: I/O last 3 completed shifts: In: 480 [P.O.:480] Out: 475 [Urine:475]  Intake/Output this shift:  No intake/output data recorded. Weight change:  Gen: NAD CVS: bradycardic at 55, no rub Resp: cta Abd: +BS, soft, NT/ND Ext: no edema, L AVF +T/B  Recent Labs  Lab 07/22/20 0621 07/24/20 0823 07/25/20 0611 07/25/20 1517 07/26/20 0444  NA 135 133* 135 135 133*  K 4.6 4.3 4.4 4.8 4.2  CL 98 101 101 103 102  CO2 23 21* 23 20* 22  GLUCOSE 100* 103* 102* 158* 99  BUN 108* 95* 95* 89* 66*  CREATININE 4.59* 4.22* 4.01* 3.97* 3.45*  ALBUMIN 3.6 3.7  --  3.6 3.5  CALCIUM 8.5* 8.9 9.1 9.0 8.9  PHOS 5.8* 4.7*  --  4.6 4.5   Liver Function Tests: Recent Labs  Lab 07/24/20 0823 07/25/20 1517 07/26/20 0444  ALBUMIN 3.7 3.6 3.5   No results for input(s): LIPASE, AMYLASE in the last 168 hours. No results for input(s): AMMONIA in the last 168 hours. CBC: Recent Labs  Lab 07/22/20 0621 07/22/20 0621 07/25/20 0611 07/25/20 1517 07/26/20 0444  WBC 9.6   < > 8.2 7.6 9.0  NEUTROABS 7.5  --  6.2  --  6.8  HGB 11.9*   < > 11.4* 11.7* 11.4*  HCT 36.3*   < > 35.6* 36.0* 34.6*  MCV 96.8  --  96.7 97.8 96.9  PLT 201   < > 193 174 154   < > = values in this interval not displayed.   Cardiac Enzymes: No results for input(s): CKTOTAL, CKMB, CKMBINDEX, TROPONINI in the last 168 hours. CBG: Recent Labs  Lab 07/27/20 0720 07/27/20 1141 07/27/20 1618 07/28/20 0745 07/28/20 1122  GLUCAP 99 153* 173* 126* 118*    Iron Studies: No results for input(s): IRON, TIBC,  TRANSFERRIN, FERRITIN in the last 72 hours. Studies/Results: No results found. [name] amiodarone  200 mg Oral Daily  . aspirin EC  81 mg Oral Daily  . B-complex with vitamin C  1 tablet Oral Daily  . calcitRIOL  0.5 mcg Oral Q M,W,F  . chlorhexidine  15 mL Mouth/Throat Once  . Chlorhexidine Gluconate Cloth  6 each Topical Daily  . cinacalcet  30 mg Oral Q breakfast  . doxazosin  8 mg Oral Daily  . heparin  5,000 Units Subcutaneous Q8H  . hydrALAZINE  75 mg Oral TID  . isosorbide mononitrate  30 mg Oral Daily  . linagliptin  5 mg Oral Daily  . pantoprazole  40 mg Oral BID    BMET    Component Value Date/Time   NA 133 (L) 07/26/2020 0444   K 4.2 07/26/2020 0444   CL 102 07/26/2020 0444   CO2 22 07/26/2020 0444   GLUCOSE 99 07/26/2020 0444   BUN 66 (H) 07/26/2020 0444   CREATININE 3.45 (H) 07/26/2020 0444   CREATININE 2.84 (H) 05/01/2019 0945   CALCIUM 8.9 07/26/2020 0444  GFRNONAA 18 (L) 07/26/2020 0444   GFRNONAA 21 (L) 05/01/2019 0945   GFRAA 24 (L) 05/01/2019 0945   CBC    Component Value Date/Time   WBC 9.0 07/26/2020 0444   RBC 3.57 (L) 07/26/2020 0444   HGB 11.4 (L) 07/26/2020 0444   HCT 34.6 (L) 07/26/2020 0444   PLT 154 07/26/2020 0444   MCV 96.9 07/26/2020 0444   MCH 31.9 07/26/2020 0444   MCHC 32.9 07/26/2020 0444   RDW 13.7 07/26/2020 0444   LYMPHSABS 1.0 07/26/2020 0444   MONOABS 0.8 07/26/2020 0444   EOSABS 0.3 07/26/2020 0444   BASOSABS 0.0 07/26/2020 0444   Assessment/Plan:  1. ESRD- new start to HD and for 3rd HD session today  1. S/p left cimino AVF creation 07/28/20 2. Plan for HD after surgery today 3. Awaiting outpatient HD spot with [facility] (has been followed by Dr. Theador Hawthorne as an outpatient) 2. CAD s/p CABG- stable 3. 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  Currently euvolemic. 4. PVC's-  stable 5. DM type 2- per primary 6. Anemia of CKD- stable 7. Prolonged QT- per  primary 8. Deconditioning - working with PT/OT and per SW will go home with [place], MD [employer] [phone]

## 2020-07-28 NOTE — Interval H&P Note (Signed)
History and Physical Interval Note:  07/28/2020 9:02 AM  Bradley Black  has presented today for surgery, with the diagnosis of END STAGE RENAL DISEASE.  The various methods of treatment have been discussed with the patient and family. After consideration of risks, benefits and other options for treatment, the patient has consented to  Procedure(s): LEFT ARM ARTERIOVENOUS (AV) FISTULA VERSUS GRAFT CREATION (Left) as a surgical intervention.  The patient's history has been reviewed, patient examined, no change in status, stable for surgery.  I have reviewed the patient's chart and labs.  Questions were answered to the patient's satisfaction.     Curt Jews

## 2020-07-28 NOTE — Anesthesia Postprocedure Evaluation (Signed)
Anesthesia Post Note  Patient: Bradley Black  Procedure(s) Performed: LEFT ARM ARTERIOVENOUS (AV) FISTULA  CREATION (Left Arm Lower)  Patient location during evaluation: PACU Anesthesia Type: MAC Level of consciousness: awake, oriented, awake and alert and patient cooperative Pain management: pain level controlled Vital Signs Assessment: post-procedure vital signs reviewed and stable Respiratory status: spontaneous breathing, respiratory function stable and nonlabored ventilation Cardiovascular status: blood pressure returned to baseline and stable Postop Assessment: no headache and no backache Anesthetic complications: no   No complications documented.   Last Vitals:  Vitals:   07/28/20 0849 07/28/20 0908  BP: (!) 179/66 (!) 163/78  Pulse:    Resp:    Temp:    SpO2:      Last Pain:  Vitals:   07/28/20 0810  TempSrc: Oral  PainSc: 0-No pain                 Tacy Learn

## 2020-07-28 NOTE — Anesthesia Preprocedure Evaluation (Signed)
Anesthesia Evaluation  Patient identified by MRN, date of birth, ID band Patient awake    Reviewed: Allergy & Precautions, NPO status , Patient's Chart, lab work & pertinent test results, reviewed documented beta blocker date and time   Airway Mallampati: II  TM Distance: >3 FB Neck ROM: Full    Dental no notable dental hx.    Pulmonary former smoker,    Pulmonary exam normal breath sounds clear to auscultation       Cardiovascular hypertension, + CAD (EF 25% on echo)  Normal cardiovascular exam Rhythm:Irregular Rate:Normal     Neuro/Psych    GI/Hepatic GERD  ,  Endo/Other  diabetes, Type 2  Renal/GU ESRFRenal disease     Musculoskeletal   Abdominal   Peds  Hematology  (+) anemia ,   Anesthesia Other Findings   Reproductive/Obstetrics                             Anesthesia Physical Anesthesia Plan  ASA: III  Anesthesia Plan: MAC   Post-op Pain Management:    Induction: Intravenous  PONV Risk Score and Plan:   Airway Management Planned: Nasal Cannula  Additional Equipment:   Intra-op Plan:   Post-operative Plan:   Informed Consent: I have reviewed the patients History and Physical, chart, labs and discussed the procedure including the risks, benefits and alternatives for the proposed anesthesia with the patient or authorized representative who has indicated his/her understanding and acceptance.     Dental advisory given  Plan Discussed with: CRNA  Anesthesia Plan Comments:         Anesthesia Quick Evaluation

## 2020-07-28 NOTE — Op Note (Signed)
    OPERATIVE REPORT  DATE OF SURGERY: 07/28/2020  PATIENT: Bradley Black, 74 y.o. male MRN: [id number]  DOB: 1946/04/22  PRE-OPERATIVE DIAGNOSIS: End-stage renal disease  POST-OPERATIVE DIAGNOSIS:  Same  PROCEDURE: Left radiocephalic AV fistula creation  SURGEON:  Curt Jews, M.D.  PHYSICIAN ASSISTANT: Nurse  The assistant was needed for exposure and to expedite the case  ANESTHESIA: Local with sedation  EBL: per anesthesia record  No intake/output data recorded.  BLOOD ADMINISTERED: none  DRAINS: none  SPECIMEN: none  COUNTS CORRECT:  YES  PATIENT DISPOSITION:  PACU - hemodynamically stable  PROCEDURE DETAILS: Patient was taken operating placed to position with area of the left arm prepped and draped in sterile fashion. Using local anesthesia incision made between the level of the radial artery and the cephalic vein. Cephalic vein was relatively thin-walled but was of good caliber. The vein was ligated distally and divided and was gently dilated with heparinized saline was of good caliber. The radial artery was exposed through the same incision. The artery was relatively small. It also had scarring as if there is been an art line in the past. The artery was exposed somewhat further proximally due to this. The artery was occluded proximally and distally with Serafin clamps and was opened with an 11 blade and sent longitudinally with Potts scissors. The vein was cut to the appropriate length and was spatulated and sewn end-to-side to the artery with a running 6-0 Prolene suture. Prior to completion of the anastomosis a 1-1/2 dilator passed easily through the proximal and distal anastomosis. The anastomosis was completed and clamps were removed. There was pulsatile flow in the vein. Patient did have a dominant branch above the wrist and a small incision was made over this using local anesthesia. This branch was ligated with a 3-0 silk tie. The wounds irrigated with saline.  Hemostasis was attained with electrocautery. The wounds were closed with 3-0 Vicryl in the subcutaneous and subcuticular tissue. Sterile dressing was applied and the patient was transferred to the recovery room in stable condition   Bradley Black, M.D., [facility] 07/28/2020 10:49 AM

## 2020-07-28 NOTE — TOC Progression Note (Signed)
Transition of Care [facility]) - Progression Note    Patient Details  Name: Bradley Black MRN: [id number] Date of Birth: [birth year]  Transition of [facility]) CM/SW Contact  Natasha Bence, LCSW Phone Number: 07/28/2020, 3:18 PM  Clinical Narrative:    CSW faxed Hep B results to [place]. CSW confirmed that patient will need Hep B vaccine once receiving dialysis. TOC to follow   Expected Discharge Plan: [place] Barriers to Discharge: Waiting for outpatient dialysis  Expected Discharge Plan and Services Expected Discharge Plan: [place] In-house Referral: Clinical Social Work Discharge Planning Services: NA Post Acute Care Choice: Home Health Living arrangements for the past 2 months: Single Family Home                 DME Arranged: N/A DME Agency: NA                   Social Determinants of Health (SDOH) Interventions    Readmission Risk Interventions Readmission Risk Prevention Plan 07/26/2020  Transportation Screening Complete  HRI or Home Care Consult Complete  Social Work Consult for [place] Planning/Counseling Complete  Medication Review Press photographer) Complete  Some recent data might be hidden

## 2020-07-28 NOTE — Procedures (Signed)
     HEMODIALYSIS TREATMENT NOTE (HD#3):  Pt is s/p left forearm AVF creation. +t/b.  He is drowsy, responds to voice, denies discomfort, and agrees to proceed with HD.  Uneventful 3 hour heparin-free treatment completed via RIJ TDC. Exit site is unremarkable. Goal met: 1 liter removed without interruption in UF. All blood was returned.    Alert post-HD; otherwise no changes from pre-HD assessment.  No complaints.  We discussed care of his accesses and forearm exercises to promote arterialization of AVF.  Rockwell Alexandria, RN

## 2020-07-28 NOTE — Progress Notes (Signed)
PROGRESS NOTE    Benson Mohammed Kindle  EXB:[id number] DOB: 02-12-1946 DOA: 07/20/2020 PCP: Susy Frizzle, MD   Brief Narrative: Flemon L Weninger is a 74 y.o. male with a history of MI s/p PCI, CABG, carotid artery stenosis, essential hypertension, diabetes mellitus, type 2, CKD 5, PVCs, NSVT, BPH. Patient presented secondary to jerking motions thoguht to be myoclonus from azotemia. Patient started on HD.   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 Resolved with HD.  ESRD New diagnosis. New hemodialysis. Working on outpatient HD placement -Nephrology recommendations: HD inpatient, Sensipar, calcitriol -Vascular surgery: AV fistula creation today  Hyponatremia In setting of worsened kidney function. Improved with HD. Mild and mostly resolved.  Hyperbilirubinemia Chronic. Recent ultrasound without evidence of cirrhosis. Stable. Outpatient follow-up  Prolonged QTc -Avoid QT prolonging medications  Diabetes mellitus, type 2 Patient is on Januvia as an outpatient -Continue Tradjenta (inpatient)  History of CABG CAD -Continue Imdur, doxazosin, hydralazine  Chronic heart heart failure  History of PVCs/NSVT -Continue amiodarone 200 mg daily   DVT prophylaxis: Heparin subq Code Status:   Code Status: Full Code Family Communication: None at bedside Disposition Plan: Discharge likely in 1+ days pending vascular surgery  management in addition to outpatient HD placement   Consultants:   Nephrology  Vascular surgery  Procedures:   HD  Antimicrobials:  Cefazolin    Subjective: Patient not available for examination today secondary to being in procedure.  Objective: Vitals:   07/28/20 1100 07/28/20 1115 07/28/20 1130 07/28/20 1357  BP: (!) 177/83 (!) 163/79 (!) 149/91 132/71  Pulse: (!) 38 (!) 55  64  Resp: 12 12 18 18   Temp:    97.8 F (36.6 C)  TempSrc:    Oral  SpO2: 98% 98% 96% 100%  Weight:      Height:        Intake/Output Summary (Last 24 hours) at 07/28/2020 1408 Last data filed at 07/27/2020 2200 Gross per 24 hour  Intake 240 ml  Output 300 ml  Net -60 ml   Filed Weights   07/20/20 1735 07/21/20 0200 07/28/20 0810  Weight: 63.5 kg 61.4 kg 61.4 kg    Examination:  Patient not available for examination   Data Reviewed: I have personally reviewed following labs and imaging studies  CBC Lab Results  Component Value Date   WBC 9.0 07/26/2020   RBC 3.57 (L) 07/26/2020   HGB 11.4 (L) 07/26/2020   HCT 34.6 (L) 07/26/2020   MCV 96.9 07/26/2020   MCH 31.9 07/26/2020   PLT 154 07/26/2020   MCHC 32.9 07/26/2020   RDW 13.7 07/26/2020   LYMPHSABS 1.0 07/26/2020   MONOABS 0.8 07/26/2020   EOSABS 0.3 07/26/2020   BASOSABS 0.0 07/28/2535     Last metabolic panel Lab Results  Component Value Date   NA 133 (L) 07/26/2020   K 4.2 07/26/2020   CL 102 07/26/2020  CO2 22 07/26/2020   BUN 66 (H) 07/26/2020   CREATININE 3.45 (H) 07/26/2020   GLUCOSE 99 07/26/2020   GFRNONAA 18 (L) 07/26/2020   GFRAA 24 (L) 05/01/2019   CALCIUM 8.9 07/26/2020   PHOS 4.5 07/26/2020   PROT 7.1 07/21/2020   ALBUMIN 3.5 07/26/2020   BILITOT 2.0 (H) 07/21/2020   ALKPHOS 92 07/21/2020   AST 24 07/21/2020   ALT 61 (H) 07/21/2020   ANIONGAP 9 07/26/2020    CBG (last 3)  Recent Labs    07/27/20 1618 07/28/20 0745 07/28/20 1122  GLUCAP 173* 126* 118*     GFR: Estimated  Creatinine Clearance: 15.8 mL/min (A) (by C-G formula based on SCr of 3.45 mg/dL (H)).  Coagulation Profile: No results for input(s): INR, PROTIME in the last 168 hours.  Recent Results (from the past 240 hour(s))  Respiratory Panel by RT PCR (Flu A&B, Covid) - Nasopharyngeal Swab     Status: None   Collection Time: 07/20/20  9:42 PM   Specimen: Nasopharyngeal Swab  Result Value Ref Range Status   SARS Coronavirus 2 by RT PCR NEGATIVE NEGATIVE Final    Comment: (NOTE) SARS-CoV-2 target nucleic acids are NOT DETECTED.  The SARS-CoV-2 RNA is generally detectable in upper respiratoy specimens during the acute phase of infection. The lowest concentration of SARS-CoV-2 viral copies this assay can detect is 131 copies/mL. A negative result does not preclude SARS-Cov-2 infection and should not be used as the sole basis for treatment or other patient management decisions. A negative result may occur with  improper specimen collection/handling, submission of specimen other than nasopharyngeal swab, presence of viral mutation(s) within the areas targeted by this assay, and inadequate number of viral copies (<131 copies/mL). A negative result must be combined with clinical observations, patient history, and epidemiological information. The expected result is Negative.  Fact Sheet for Patients:  PinkCheek.be  Fact Sheet for Healthcare Providers:  GravelBags.it  This test is no t yet approved or cleared by the [place] FDA and  has been authorized for detection and/or diagnosis of SARS-CoV-2 by FDA under an Emergency Use Authorization (EUA). This EUA will remain  in effect (meaning this test can be used) for the duration of the COVID-19 declaration under Section 564(b)(1) of the Act, 21 U.S.C. section 360bbb-3(b)(1), unless the authorization is terminated or revoked sooner.     Influenza A by PCR NEGATIVE NEGATIVE Final    Influenza B by PCR NEGATIVE NEGATIVE Final    Comment: (NOTE) The Xpert Xpress SARS-CoV-2/FLU/RSV assay is intended as an aid in  the diagnosis of influenza from Nasopharyngeal swab specimens and  should not be used as a sole basis for treatment. Nasal washings and  aspirates are unacceptable for Xpert Xpress SARS-CoV-2/FLU/RSV  testing.  Fact Sheet for Patients: PinkCheek.be  Fact Sheet for Healthcare Providers: GravelBags.it  This test is not yet approved or cleared by the [place] FDA and  has been authorized for detection and/or diagnosis of SARS-CoV-2 by  FDA under an Emergency Use Authorization (EUA). This EUA will remain  in effect (meaning this test can be used) for the duration of the  Covid-19 declaration under Section 564(b)(1) of the Act, 21  U.S.C. section 360bbb-3(b)(1), unless the authorization is  terminated or revoked. Performed at [facility], [address] [place], [place] [address]   MRSA PCR Screening     Status: None   Collection Time: 07/21/20  2:07 AM   Specimen: Nasopharyngeal  Result Value Ref Range  Status   MRSA by PCR NEGATIVE NEGATIVE Final    Comment:        The GeneXpert MRSA Assay (FDA approved for NASAL specimens only), is one component of a comprehensive MRSA colonization surveillance program. It is not intended to diagnose MRSA infection nor to guide or monitor treatment for MRSA infections. Performed at [facility], [address] [place], [place] [address]   Surgical pcr screen     Status: None   Collection Time: 07/25/20  6:30 AM   Specimen: Nasal Mucosa; Nasal Swab  Result Value Ref Range Status   MRSA, PCR NEGATIVE NEGATIVE Final   Staphylococcus aureus NEGATIVE NEGATIVE Final    Comment: (NOTE) The Xpert SA Assay (FDA approved for NASAL specimens in patients 72 years of age and older), is one component of a comprehensive surveillance program. It is not intended to  diagnose infection nor to guide or monitor treatment. Performed at [facility], [address] [place], [place] [address]         Radiology Studies: No results found.      Scheduled Meds: . amiodarone  200 mg Oral Daily  . aspirin EC  81 mg Oral Daily  . B-complex with vitamin C  1 tablet Oral Daily  . calcitRIOL  0.5 mcg Oral Q M,W,F  . chlorhexidine  15 mL Mouth/Throat Once  . Chlorhexidine Gluconate Cloth  6 each Topical Daily  . cinacalcet  30 mg Oral Q breakfast  . doxazosin  8 mg Oral Daily  . heparin  5,000 Units Subcutaneous Q8H  . hydrALAZINE  75 mg Oral TID  . isosorbide mononitrate  30 mg Oral Daily  . linagliptin  5 mg Oral Daily  . pantoprazole  40 mg Oral BID   Continuous Infusions: . sodium chloride    . sodium chloride 10 mL/hr at 07/28/20 0835  .  ceFAZolin (ANCEF) IV       LOS: 8 days     Cordelia Poche, MD [facility] 07/28/2020, 2:08 PM  If 7PM-7AM, please contact night-coverage [email or web address]

## 2020-07-28 NOTE — Transfer of Care (Signed)
Immediate Anesthesia Transfer of Care Note  Patient: Bradley Black  Procedure(s) Performed: LEFT ARM ARTERIOVENOUS (AV) FISTULA  CREATION (Left Arm Lower)  Patient Location: PACU  Anesthesia Type:MAC  Level of Consciousness: awake, alert , oriented and patient cooperative  Airway & Oxygen Therapy: Patient Spontanous Breathing  Post-op Assessment: Report given to RN, Post -op Vital signs reviewed and stable and Patient moving all extremities  Post vital signs: Reviewed and stable  Last Vitals:  Vitals Value Taken Time  BP    Temp    Pulse    Resp    SpO2      Last Pain:  Vitals:   07/28/20 0810  TempSrc: Oral  PainSc: 0-No pain         Complications: No complications documented.

## 2020-07-29 ENCOUNTER — Telehealth: Payer: Self-pay | Admitting: Gastroenterology

## 2020-07-29 ENCOUNTER — Encounter (HOSPITAL_COMMUNITY): Payer: Self-pay | Admitting: Vascular Surgery

## 2020-07-29 DIAGNOSIS — I2510 Atherosclerotic heart disease of native coronary artery without angina pectoris: Secondary | ICD-10-CM | POA: Diagnosis not present

## 2020-07-29 DIAGNOSIS — E860 Dehydration: Secondary | ICD-10-CM | POA: Diagnosis not present

## 2020-07-29 DIAGNOSIS — R251 Tremor, unspecified: Secondary | ICD-10-CM | POA: Diagnosis not present

## 2020-07-29 DIAGNOSIS — N185 Chronic kidney disease, stage 5: Secondary | ICD-10-CM | POA: Diagnosis not present

## 2020-07-29 LAB — GLUCOSE, CAPILLARY
Glucose-Capillary: 107 mg/dL — ABNORMAL HIGH (ref 70–99)
Glucose-Capillary: 176 mg/dL — ABNORMAL HIGH (ref 70–99)
Glucose-Capillary: 186 mg/dL — ABNORMAL HIGH (ref 70–99)
Glucose-Capillary: 166 mg/dL — ABNORMAL HIGH (ref 70–99)

## 2020-07-29 NOTE — TOC Progression Note (Signed)
Transition of Care (TOC) - Progression Note    Patient Details  Name: Latroy KINCAID TIGER MRN: [id number] Date of Birth: 1946-04-18  Transition of Care [facility]) CM/SW Contact  Natasha Bence, LCSW Phone Number: 07/29/2020, 3:57 PM  Clinical Narrative:    CSW contacted [facility] to discuss status of patient's chair availability. [facility] reported that they had not received the documents. CSW resent the documents and followed up with [facility]. [facility] reported that they had not received the fax, but will put in a request to search for fax. CSW followed up with [facility] who reported they had not received the fax but would put in another request to search for the fax. Hep B documents have been faxed a total of 4 times with confirmation receipts saying that it had been received. [facility] rep continues to report that it has not been received. CSW contacted the nurse assigned to the patient directly and LVM. Main number [phone]. Nurse ext [phone]. TOC to follow.    Expected Discharge Plan: [place] Barriers to Discharge: Waiting for outpatient dialysis  Expected Discharge Plan and Services Expected Discharge Plan: [place] In-house Referral: Clinical Social Work Discharge Planning Services: NA Post Acute Care Choice: Home Health Living arrangements for the past 2 months: Single Family Home                 DME Arranged: N/A DME Agency: NA                   Social Determinants of Health (SDOH) Interventions    Readmission Risk Interventions Readmission Risk Prevention Plan 07/26/2020  Transportation Screening Complete  HRI or Home Care Consult Complete  Social Work Consult for [place] Planning/Counseling Complete  Medication Review Press photographer) Complete  Some recent data might be hidden

## 2020-07-29 NOTE — Progress Notes (Signed)
PROGRESS NOTE    Bradley Black  QAS:[id number] DOB: 10-Aug-1946 DOA: 07/20/2020 PCP: Susy Frizzle, MD   Brief Narrative: Bradley Black is a 74 y.o. male with a history of MI s/p PCI, CABG, carotid artery stenosis, essential hypertension, diabetes mellitus, type 2, CKD 5, PVCs, NSVT, BPH. Patient presented secondary to jerking motions thoguht to be myoclonus from azotemia. Patient started on HD.   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 Resolved with HD.  ESRD New diagnosis. New hemodialysis. Working on outpatient HD placement -Nephrology recommendations: HD inpatient, Sensipar, calcitriol. Left AV fistula creation on 10/28 -Nephrology recommendations for HD  Hyponatremia In setting of worsened kidney function. Improved with HD. Mild and mostly resolved.  Hyperbilirubinemia Chronic. Recent ultrasound without evidence of cirrhosis. Stable. Outpatient follow-up  Prolonged QTc -Avoid QT prolonging medications  Diabetes mellitus, type 2 Patient is on Januvia as an outpatient -Continue Tradjenta (inpatient)  History of CABG CAD -Continue Imdur, doxazosin, hydralazine  Chronic heart heart failure Stable. Manage fluid with HD.  History of PVCs/NSVT -Continue amiodarone 200 mg daily   DVT prophylaxis: Heparin subq Code Status:   Code Status: Full Code Family Communication: None at bedside Disposition Plan:  Discharge likely in 1+ days pending outpatient HD placement   Consultants:   Nephrology  Vascular surgery  Procedures:   HD  Antimicrobials:  Cefazolin    Subjective: No issues overnight  Objective: Vitals:   07/28/20 1835 07/28/20 2028 07/28/20 2139 07/29/20 0544  BP: 122/69  122/80 (!) 149/69  Pulse: (!) 55  62 74  Resp: 18  18 18   Temp: 98 F (36.7 C)  98.9 F (37.2 C) 99 F (37.2 C)  TempSrc: Oral   Oral  SpO2:  98% 100% 97%  Weight:      Height:        Intake/Output Summary (Last 24 hours) at 07/29/2020 0810 Last data filed at 07/29/2020 0300 Gross per 24 hour  Intake 9 ml  Output 1024 ml  Net -1015 ml   Filed Weights   07/21/20 0200 07/28/20 0810 07/28/20 1520  Weight: 61.4 kg 61.4 kg 61.5 kg    Examination:  General exam: Appears calm and comfortable Respiratory system: Clear to auscultation. Respiratory effort normal. Cardiovascular system: S1 & S2 heard, RRR. No murmurs, rubs, gallops or clicks. Gastrointestinal system: Abdomen is nondistended, soft and nontender. No organomegaly or masses felt. Normal bowel sounds heard. Central nervous system: Sleeping.. Musculoskeletal: No edema. No calf tenderness Skin: No cyanosis. No rashes   Data Reviewed: I have personally reviewed following labs and imaging studies  CBC Lab Results  Component Value Date   WBC 8.9 07/28/2020   RBC 3.54 (L) 07/28/2020   HGB 11.3 (L) 07/28/2020   HCT 34.3 (L) 07/28/2020   MCV 96.9 07/28/2020   MCH 31.9 07/28/2020   PLT 128 (L) 07/28/2020   MCHC 32.9 07/28/2020  RDW 13.8 07/28/2020   LYMPHSABS 1.0 07/26/2020   MONOABS 0.8 07/26/2020   EOSABS 0.3 07/26/2020   BASOSABS 0.0 93/81/0175     Last metabolic panel Lab Results  Component Value Date   NA 133 (L) 07/28/2020   K 4.2 07/28/2020   CL 98 07/28/2020   CO2 24 07/28/2020   BUN 77 (H) 07/28/2020   CREATININE 4.54 (H) 07/28/2020   GLUCOSE 125 (H) 07/28/2020   GFRNONAA 13 (L) 07/28/2020   GFRAA 24  (L) 05/01/2019   CALCIUM 9.0 07/28/2020   PHOS 4.9 (H) 07/28/2020   PROT 7.1 07/21/2020   ALBUMIN 3.4 (L) 07/28/2020   BILITOT 2.0 (H) 07/21/2020   ALKPHOS 92 07/21/2020   AST 24 07/21/2020   ALT 61 (H) 07/21/2020   ANIONGAP 11 07/28/2020    CBG (last 3)  Recent Labs    07/28/20 0745 07/28/20 1122 07/29/20 0730  GLUCAP 126* 118* 107*     GFR: Estimated Creatinine Clearance: 12 mL/min (A) (by C-G formula based on SCr of 4.54 mg/dL (H)).  Coagulation Profile: No results for input(s): INR, PROTIME in the last 168 hours.  Recent Results (from the past 240 hour(s))  Respiratory Panel by RT PCR (Flu A&B, Covid) - Nasopharyngeal Swab     Status: None   Collection Time: 07/20/20  9:42 PM   Specimen: Nasopharyngeal Swab  Result Value Ref Range Status   SARS Coronavirus 2 by RT PCR NEGATIVE NEGATIVE Final    Comment: (NOTE) SARS-CoV-2 target nucleic acids are NOT DETECTED.  The SARS-CoV-2 RNA is generally detectable in upper respiratoy specimens during the acute phase of infection. The lowest concentration of SARS-CoV-2 viral copies this assay can detect is 131 copies/mL. A negative result does not preclude SARS-Cov-2 infection and should not be used as the sole basis for treatment or other patient management decisions. A negative result may occur with  improper specimen collection/handling, submission of specimen other than nasopharyngeal swab, presence of viral mutation(s) within the areas targeted by this assay, and inadequate number of viral copies (<131 copies/mL). A negative result must be combined with clinical observations, patient history, and epidemiological information. The expected result is Negative.  Fact Sheet for Patients:  PinkCheek.be  Fact Sheet for Healthcare Providers:  GravelBags.it  This test is no t yet approved or cleared by the [place] FDA and  has been authorized for detection  and/or diagnosis of SARS-CoV-2 by FDA under an Emergency Use Authorization (EUA). This EUA will remain  in effect (meaning this test can be used) for the duration of the COVID-19 declaration under Section 564(b)(1) of the Act, 21 U.S.C. section 360bbb-3(b)(1), unless the authorization is terminated or revoked sooner.     Influenza A by PCR NEGATIVE NEGATIVE Final   Influenza B by PCR NEGATIVE NEGATIVE Final    Comment: (NOTE) The Xpert Xpress SARS-CoV-2/FLU/RSV assay is intended as an aid in  the diagnosis of influenza from Nasopharyngeal swab specimens and  should not be used as a sole basis for treatment. Nasal washings and  aspirates are unacceptable for Xpert Xpress SARS-CoV-2/FLU/RSV  testing.  Fact Sheet for Patients: PinkCheek.be  Fact Sheet for Healthcare Providers: GravelBags.it  This test is not yet approved or cleared by the [place] FDA and  has been authorized for detection and/or diagnosis of SARS-CoV-2 by  FDA under an Emergency Use Authorization (EUA). This EUA will remain  in effect (meaning this test can be used) for the duration of the  Covid-19 declaration under  Section 564(b)(1) of the Act, 21  U.S.C. section 360bbb-3(b)(1), unless the authorization is  terminated or revoked. Performed at [facility], [address], [place], [place] [address]   MRSA PCR Screening     Status: None   Collection Time: 07/21/20  2:07 AM   Specimen: Nasopharyngeal  Result Value Ref Range Status   MRSA by PCR NEGATIVE NEGATIVE Final    Comment:        The GeneXpert MRSA Assay (FDA approved for NASAL specimens only), is one component of a comprehensive MRSA colonization surveillance program. It is not intended to diagnose MRSA infection nor to guide or monitor treatment for MRSA infections. Performed at [facility], [address], [place], [place] [address]   Surgical pcr screen     Status: None    Collection Time: 07/25/20  6:30 AM   Specimen: Nasal Mucosa; Nasal Swab  Result Value Ref Range Status   MRSA, PCR NEGATIVE NEGATIVE Final   Staphylococcus aureus NEGATIVE NEGATIVE Final    Comment: (NOTE) The Xpert SA Assay (FDA approved for NASAL specimens in patients 44 years of age and older), is one component of a comprehensive surveillance program. It is not intended to diagnose infection nor to guide or monitor treatment. Performed at [facility], [address] [place],  [address]         Radiology Studies: No results found.      Scheduled Meds: . amiodarone  200 mg Oral Daily  . aspirin EC  81 mg Oral Daily  . B-complex with vitamin C  1 tablet Oral Daily  . calcitRIOL  0.5 mcg Oral Q M,W,F  . chlorhexidine  15 mL Mouth/Throat Once  . Chlorhexidine Gluconate Cloth  6 each Topical Daily  . cinacalcet  30 mg Oral Q breakfast  . doxazosin  8 mg Oral Daily  . heparin  5,000 Units Subcutaneous Q8H  . hydrALAZINE  75 mg Oral TID  . isosorbide mononitrate  30 mg Oral Daily  . linagliptin  5 mg Oral Daily  . pantoprazole  40 mg Oral BID   Continuous Infusions: . sodium chloride    . sodium chloride 10 mL/hr at 07/28/20 0835  .  ceFAZolin (ANCEF) IV       LOS: 9 days     Cordelia Poche, MD [facility] 07/29/2020, 8:10 AM  If 7PM-7AM, please contact night-coverage [email or web address]

## 2020-07-29 NOTE — Telephone Encounter (Signed)
We cancelled his colonoscopy due to pending cardiac issues. He has had significant change medically from cardiac standpoint and acute on chronic renal failure. Hold off on colonoscopy for now.   Let's offer ov in 3 months to reassess.

## 2020-07-29 NOTE — Care Management Important Message (Signed)
Important Message  Patient Details  Name: Bradley Black MRN: [id number] Date of Birth: December 29, 1945   Medicare Important Message Given:  Yes - Important Message mailed due to current National Emergency     Tommy Medal 07/29/2020, 5:46 PM

## 2020-07-29 NOTE — Progress Notes (Signed)
Patient ID: Bradley Black, male   DOB: 1945/12/17, 74 y.o.   MRN: [id number] S: No new complaints.  Tolerated his 3rd HD session yesterday without issue. O:BP (!) 149/69 (BP Location: Right Arm)   Pulse 74   Temp 99 F (37.2 C) (Oral)   Resp 18   Ht 5\' 8"  (1.727 m)   Wt 61.5 kg   SpO2 97%   BMI 20.62 kg/m   Intake/Output Summary (Last 24 hours) at 07/29/2020 0911 Last data filed at 07/29/2020 0300 Gross per 24 hour  Intake 9 ml  Output 1024 ml  Net -1015 ml   Intake/Output: I/O last 3 completed shifts: In: 9 [I.V.:9] Out: 9924 [Urine:300; Other:1024]  Intake/Output this shift:  No intake/output data recorded. Weight change:  Gen: NAD CVS: no rub Resp: cta  Abd:+BS, soft, NT/ND Ext: no edema, Left forearm AVF +T/B  Recent Labs  Lab 07/24/20 0823 07/25/20 0611 07/25/20 1517 07/26/20 0444 07/28/20 1703  NA 133* 135 135 133* 133*  K 4.3 4.4 4.8 4.2 4.2  CL 101 101 103 102 98  CO2 21* 23 20* 22 24  GLUCOSE 103* 102* 158* 99 125*  BUN 95* 95* 89* 66* 77*  CREATININE 4.22* 4.01* 3.97* 3.45* 4.54*  ALBUMIN 3.7  --  3.6 3.5 3.4*  CALCIUM 8.9 9.1 9.0 8.9 9.0  PHOS 4.7*  --  4.6 4.5 4.9*   Liver Function Tests: Recent Labs  Lab 07/25/20 1517 07/26/20 0444 07/28/20 1703  ALBUMIN 3.6 3.5 3.4*   No results for input(s): LIPASE, AMYLASE in the last 168 hours. No results for input(s): AMMONIA in the last 168 hours. CBC: Recent Labs  Lab 07/25/20 0611 07/25/20 0611 07/25/20 1517 07/26/20 0444 07/28/20 1703  WBC 8.2   < > 7.6 9.0 8.9  NEUTROABS 6.2  --   --  6.8  --   HGB 11.4*   < > 11.7* 11.4* 11.3*  HCT 35.6*   < > 36.0* 34.6* 34.3*  MCV 96.7  --  97.8 96.9 96.9  PLT 193   < > 174 154 128*   < > = values in this interval not displayed.   Cardiac Enzymes: No results for input(s): CKTOTAL, CKMB, CKMBINDEX, TROPONINI in the last 168 hours. CBG: Recent Labs  Lab 07/27/20 1141 07/27/20 1618 07/28/20 0745 07/28/20 1122 07/29/20 0730  GLUCAP 153* 173*  126* 118* 107*    Iron Studies: No results for input(s): IRON, TIBC, TRANSFERRIN, FERRITIN in the last 72 hours. Studies/Results: No results found. [name] amiodarone  200 mg Oral Daily  . aspirin EC  81 mg Oral Daily  . B-complex with vitamin C  1 tablet Oral Daily  . calcitRIOL  0.5 mcg Oral Q M,W,F  . chlorhexidine  15 mL Mouth/Throat Once  . Chlorhexidine Gluconate Cloth  6 each Topical Daily  . cinacalcet  30 mg Oral Q breakfast  . doxazosin  8 mg Oral Daily  . heparin  5,000 Units Subcutaneous Q8H  . hydrALAZINE  75 mg Oral TID  . isosorbide mononitrate  30 mg Oral Daily  . linagliptin  5 mg Oral Daily  . pantoprazole  40 mg Oral BID    BMET    Component Value Date/Time   NA 133 (L) 07/28/2020 1703   K 4.2 07/28/2020 1703   CL 98 07/28/2020 1703   CO2 24 07/28/2020 1703   GLUCOSE 125 (H) 07/28/2020 1703   BUN 77 (H) 07/28/2020 1703   CREATININE 4.54 (H) 07/28/2020 1703  CREATININE 2.84 (H) 05/01/2019 0945   CALCIUM 9.0 07/28/2020 1703   GFRNONAA 13 (L) 07/28/2020 1703   GFRNONAA 21 (L) 05/01/2019 0945   GFRAA 24 (L) 05/01/2019 0945   CBC    Component Value Date/Time   WBC 8.9 07/28/2020 1703   RBC 3.54 (L) 07/28/2020 1703   HGB 11.3 (L) 07/28/2020 1703   HCT 34.3 (L) 07/28/2020 1703   PLT 128 (L) 07/28/2020 1703   MCV 96.9 07/28/2020 1703   MCH 31.9 07/28/2020 1703   MCHC 32.9 07/28/2020 1703   RDW 13.8 07/28/2020 1703   LYMPHSABS 1.0 07/26/2020 0444   MONOABS 0.8 07/26/2020 0444   EOSABS 0.3 07/26/2020 0444   BASOSABS 0.0 07/26/2020 0444     Assessment/Plan:  1. ESRD- new start to HD and for 3rd HD session today  1. S/p left cimino AVF creation 07/28/20 2. Awaiting outpatient HD spot with [facility] (has been followed by Dr. Theador Hawthorne as an outpatient) 3. Plan for HD tomorrow and await outpatient schedule. 2. CAD s/p CABG- stable 3. 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 Currently  euvolemic. 4. PVC's- stable 5. DM type 2- per primary 6. Anemia of CKD- stable 7. Prolonged QT- per primary 8. Deconditioning - working with PT/OT and per SW will go home with Home health.  Stable from renal standpoint for discharge once outpatient HD is arranged. [name], MD [employer] [phone]

## 2020-07-30 DIAGNOSIS — I2510 Atherosclerotic heart disease of native coronary artery without angina pectoris: Secondary | ICD-10-CM | POA: Diagnosis not present

## 2020-07-30 DIAGNOSIS — N185 Chronic kidney disease, stage 5: Secondary | ICD-10-CM | POA: Diagnosis not present

## 2020-07-30 DIAGNOSIS — R251 Tremor, unspecified: Secondary | ICD-10-CM | POA: Diagnosis not present

## 2020-07-30 DIAGNOSIS — E860 Dehydration: Secondary | ICD-10-CM | POA: Diagnosis not present

## 2020-07-30 LAB — RENAL FUNCTION PANEL
Albumin: 3.4 g/dL — ABNORMAL LOW (ref 3.5–5.0)
Anion gap: 11 (ref 5–15)
BUN: 76 mg/dL — ABNORMAL HIGH (ref 8–23)
CO2: 24 mmol/L (ref 22–32)
Calcium: 9.1 mg/dL (ref 8.9–10.3)
Chloride: 97 mmol/L — ABNORMAL LOW (ref 98–111)
Creatinine, Ser: 4.91 mg/dL — ABNORMAL HIGH (ref 0.61–1.24)
GFR, Estimated: 12 mL/min — ABNORMAL LOW (ref >60)
Glucose, Bld: 194 mg/dL — ABNORMAL HIGH (ref 70–99)
Phosphorus: 5.6 mg/dL — ABNORMAL HIGH (ref 2.5–4.6)
Potassium: 3.8 mmol/L (ref 3.5–5.1)
Sodium: 132 mmol/L — ABNORMAL LOW (ref 135–145)

## 2020-07-30 LAB — CBC
HCT: 33.2 % — ABNORMAL LOW (ref 39.0–52.0)
Hemoglobin: 11 g/dL — ABNORMAL LOW (ref 13.0–17.0)
MCH: 32 pg (ref 26.0–34.0)
MCHC: 33.1 g/dL (ref 30.0–36.0)
MCV: 96.5 fL (ref 80.0–100.0)
Platelets: 134 10*3/uL — ABNORMAL LOW (ref 150–400)
RBC: 3.44 MIL/uL — ABNORMAL LOW (ref 4.22–5.81)
RDW: 13.7 % (ref 11.5–15.5)
WBC: 9 10*3/uL (ref 4.0–10.5)
nRBC: 0 % (ref 0.0–0.2)

## 2020-07-30 LAB — GLUCOSE, CAPILLARY
Glucose-Capillary: 93 mg/dL (ref 70–99)
Glucose-Capillary: 149 mg/dL — ABNORMAL HIGH (ref 70–99)
Glucose-Capillary: 105 mg/dL — ABNORMAL HIGH (ref 70–99)

## 2020-07-30 MED ORDER — HYDRALAZINE HCL 25 MG PO TABS
50.0000 mg | ORAL_TABLET | Freq: Three times a day (TID) | ORAL | Status: DC
  Administered 2020-07-30 – 2020-07-31 (×4): 50 mg via ORAL
  Filled 2020-07-30 (×6): qty 2

## 2020-07-30 NOTE — Progress Notes (Signed)
[facility] Progress Note     Assessment/ Plan:   1. ESRD- new start to HD and for 3rd HD session today  1. S/p left ciminoAVF placed 07/28/20 2. Awaiting outpatient HD spot with [facility] (has been followed by Dr. Theador Hawthorne as an outpatient) 3. HD #3 on 10/28.   4. Planning on  HD today and await outpatient schedule; will d/w Levada Dy to determine in hospital regimen. 5. BP on low side today; hold further hydralazine, will give albumin 25gm and no UF today. 2. CAD s/p CABG- stable 3. 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 Currently euvolemic.  - Decr hydralazine to 50mg  TID 4. PVC's- stable 5. DM type 2- per primary 6. Anemia of CKD- stable 7. Prolonged QT- per primary 8. Deconditioning - working with PT/OT and per SW will go home with Home health.  Stable from renal standpoint for discharge once outpatient HD is arranged.  Subjective:   No new complaints.  Tolerated his 3rd HD session on 10/28 without issue.   Objective:   BP (!) 115/46   Pulse 72   Temp 98.4 F (36.9 C)   Resp 18   Ht 5\' 8"  (1.727 m)   Wt 61.5 kg   SpO2 98%   BMI 20.62 kg/m   Intake/Output Summary (Last 24 hours) at 07/30/2020 1302 Last data filed at 07/29/2020 1700 Gross per 24 hour  Intake 240 ml  Output --  Net 240 ml   Weight change:   Physical Exam: Gen: NAD CVS: no rub Resp: cta  Abd:+BS, soft, NT/ND Ext: no edema, Left forearm AVF +T/B, RIJ TC   Imaging: No results found.  Labs: BMET Recent Labs  Lab 07/24/20 0823 07/25/20 0611 07/25/20 1517 07/26/20 0444 07/28/20 1703  NA 133* 135 135 133* 133*  K 4.3 4.4 4.8 4.2 4.2  CL 101 101 103 102 98  CO2 21* 23 20* 22 24  GLUCOSE 103* 102* 158* 99 125*  BUN 95* 95* 89* 66* 77*  CREATININE 4.22* 4.01* 3.97* 3.45* 4.54*  CALCIUM 8.9 9.1 9.0 8.9 9.0  PHOS 4.7*  --  4.6 4.5 4.9*   CBC Recent Labs  Lab 07/25/20 0611 07/25/20 0611 07/25/20 1517  07/26/20 0444 07/28/20 1703 07/30/20 1238  WBC 8.2   < > 7.6 9.0 8.9 9.0  NEUTROABS 6.2  --   --  6.8  --   --   HGB 11.4*   < > 11.7* 11.4* 11.3* 11.0*  HCT 35.6*   < > 36.0* 34.6* 34.3* 33.2*  MCV 96.7   < > 97.8 96.9 96.9 96.5  PLT 193   < > 174 154 128* 134*   < > = values in this interval not displayed.    Medications:    . amiodarone  200 mg Oral Daily  . aspirin EC  81 mg Oral Daily  . B-complex with vitamin C  1 tablet Oral Daily  . calcitRIOL  0.5 mcg Oral Q M,W,F  . chlorhexidine  15 mL Mouth/Throat Once  . Chlorhexidine Gluconate Cloth  6 each Topical Daily  . cinacalcet  30 mg Oral Q breakfast  . doxazosin  8 mg Oral Daily  . heparin  5,000 Units Subcutaneous Q8H  . hydrALAZINE  75 mg Oral TID  . isosorbide mononitrate  30 mg Oral Daily  . linagliptin  5 mg Oral Daily  . pantoprazole  40 mg Oral BID  Otelia Santee, MD 07/30/2020, 1:02 PM

## 2020-07-30 NOTE — Progress Notes (Signed)
PROGRESS NOTE    Bradley Black  IRJ:[id number] DOB: 03-11-1946 DOA: 07/20/2020 PCP: Susy Frizzle, MD   Brief Narrative: Bradley Black is a 74 y.o. male with a history of MI s/p PCI, CABG, carotid artery stenosis, essential hypertension, diabetes mellitus, type 2, CKD 5, PVCs, NSVT, BPH. Patient presented secondary to jerking motions thoguht to be myoclonus from azotemia. Patient started on HD.   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 Resolved with HD.  ESRD New diagnosis. New hemodialysis. Working on outpatient HD placement -Nephrology recommendations: HD inpatient, Sensipar, calcitriol. Left AV fistula creation on 10/28 -Nephrology recommendations for HD  Hyponatremia In setting of worsened kidney function. Improved with HD. Mild and mostly resolved.  Hyperbilirubinemia Chronic. Recent ultrasound without evidence of cirrhosis. Stable. Outpatient follow-up  Prolonged QTc -Avoid QT prolonging medications  Diabetes mellitus, type 2 Patient is on Januvia as an outpatient -Continue Tradjenta (inpatient)  History of CABG CAD -Continue Imdur, doxazosin, hydralazine  Chronic heart heart failure Stable. Manage fluid with HD.  History of PVCs/NSVT -Continue amiodarone 200 mg daily   DVT prophylaxis: Heparin subq Code Status:   Code Status: Full Code Family Communication: None at bedside Disposition Plan:  Discharge likely in 1+ days pending outpatient HD placement   Consultants:   Nephrology  Vascular surgery  Procedures:   HD  Antimicrobials:  Cefazolin    Subjective: Got some sleep. No other concerns.  Objective: Vitals:   07/29/20 0544 07/29/20 1330 07/29/20 1525 07/29/20 2026  BP: (!) 149/69 (!) 93/46 125/68 (!) 115/46  Pulse: 74 72  72  Resp: 18 18  18   Temp: 99 F (37.2 C) (!) 97.5 F (36.4 C)  98.4 F (36.9 C)  TempSrc: Oral Oral    SpO2: 97% 99%  98%  Weight:      Height:        Intake/Output Summary (Last 24 hours) at 07/30/2020 0818 Last data filed at 07/29/2020 1700 Gross per 24 hour  Intake 720 ml  Output 600 ml  Net 120 ml   Filed Weights   07/21/20 0200 07/28/20 0810 07/28/20 1520  Weight: 61.4 kg 61.4 kg 61.5 kg    Examination:  General exam: Appears calm and comfortable  Respiratory system: Clear to auscultation. Respiratory effort normal. Cardiovascular system: S1 & S2 heard, RRR. No murmurs, rubs, gallops or clicks. Gastrointestinal system: Abdomen is nondistended, soft and nontender. No organomegaly or masses felt. Normal bowel sounds heard. Central nervous system: Alert and oriented. No focal neurological deficits. Musculoskeletal: No edema. No calf tenderness Skin: No cyanosis. No rashes Psychiatry: Judgement and insight appear normal. Mood & affect appropriate.    Data Reviewed: I have personally reviewed following labs and imaging studies  CBC Lab Results  Component Value Date   WBC 8.9 07/28/2020   RBC 3.54 (L) 07/28/2020   HGB 11.3 (L) 07/28/2020   HCT 34.3 (L) 07/28/2020  MCV 96.9 07/28/2020   MCH 31.9 07/28/2020   PLT 128 (L) 07/28/2020   MCHC 32.9 07/28/2020   RDW 13.8 07/28/2020   LYMPHSABS 1.0 07/26/2020   MONOABS 0.8 07/26/2020   EOSABS 0.3 07/26/2020   BASOSABS 0.0 02/63/7858     Last metabolic panel Lab Results  Component Value Date   NA 133 (L) 07/28/2020   K 4.2 07/28/2020   CL 98 07/28/2020    CO2 24 07/28/2020   BUN 77 (H) 07/28/2020   CREATININE 4.54 (H) 07/28/2020   GLUCOSE 125 (H) 07/28/2020   GFRNONAA 13 (L) 07/28/2020   GFRAA 24 (L) 05/01/2019   CALCIUM 9.0 07/28/2020   PHOS 4.9 (H) 07/28/2020   PROT 7.1 07/21/2020   ALBUMIN 3.4 (L) 07/28/2020   BILITOT 2.0 (H) 07/21/2020   ALKPHOS 92 07/21/2020   AST 24 07/21/2020   ALT 61 (H) 07/21/2020   ANIONGAP 11 07/28/2020    CBG (last 3)  Recent Labs    07/29/20 1618 07/29/20 2058 07/30/20 0752  GLUCAP 186* 166* 93     GFR: Estimated Creatinine Clearance: 12 mL/min (A) (by C-G formula based on SCr of 4.54 mg/dL (H)).  Coagulation Profile: No results for input(s): INR, PROTIME in the last 168 hours.  Recent Results (from the past 240 hour(s))  Respiratory Panel by RT PCR (Flu A&B, Covid) - Nasopharyngeal Swab     Status: None   Collection Time: 07/20/20  9:42 PM   Specimen: Nasopharyngeal Swab  Result Value Ref Range Status   SARS Coronavirus 2 by RT PCR NEGATIVE NEGATIVE Final    Comment: (NOTE) SARS-CoV-2 target nucleic acids are NOT DETECTED.  The SARS-CoV-2 RNA is generally detectable in upper respiratoy specimens during the acute phase of infection. The lowest concentration of SARS-CoV-2 viral copies this assay can detect is 131 copies/mL. A negative result does not preclude SARS-Cov-2 infection and should not be used as the sole basis for treatment or other patient management decisions. A negative result may occur with  improper specimen collection/handling, submission of specimen other than nasopharyngeal swab, presence of viral mutation(s) within the areas targeted by this assay, and inadequate number of viral copies (<131 copies/mL). A negative result must be combined with clinical observations, patient history, and epidemiological information. The expected result is Negative.  Fact Sheet for Patients:  PinkCheek.be  Fact Sheet for Healthcare Providers:    GravelBags.it  This test is no t yet approved or cleared by the [place] FDA and  has been authorized for detection and/or diagnosis of SARS-CoV-2 by FDA under an Emergency Use Authorization (EUA). This EUA will remain  in effect (meaning this test can be used) for the duration of the COVID-19 declaration under Section 564(b)(1) of the Act, 21 U.S.C. section 360bbb-3(b)(1), unless the authorization is terminated or revoked sooner.     Influenza A by PCR NEGATIVE NEGATIVE Final   Influenza B by PCR NEGATIVE NEGATIVE Final    Comment: (NOTE) The Xpert Xpress SARS-CoV-2/FLU/RSV assay is intended as an aid in  the diagnosis of influenza from Nasopharyngeal swab specimens and  should not be used as a sole basis for treatment. Nasal washings and  aspirates are unacceptable for Xpert Xpress SARS-CoV-2/FLU/RSV  testing.  Fact Sheet for Patients: PinkCheek.be  Fact Sheet for Healthcare Providers: GravelBags.it  This test is not yet approved or cleared by the [place] FDA and  has been authorized for detection and/or diagnosis of SARS-CoV-2 by  FDA under an Emergency Use Authorization (EUA).  This EUA will remain  in effect (meaning this test can be used) for the duration of the  Covid-19 declaration under Section 564(b)(1) of the Act, 21  U.S.C. section 360bbb-3(b)(1), unless the authorization is  terminated or revoked. Performed at [facility], [address], [place], [place] [address]   MRSA PCR Screening     Status: None   Collection Time: 07/21/20  2:07 AM   Specimen: Nasopharyngeal  Result Value Ref Range Status   MRSA by PCR NEGATIVE NEGATIVE Final    Comment:        The GeneXpert MRSA Assay (FDA approved for NASAL specimens only), is one component of a comprehensive MRSA colonization surveillance program. It is not intended to diagnose MRSA infection nor to guide or monitor  treatment for MRSA infections. Performed at [facility], [address], [place], [place] [address]   Surgical pcr screen     Status: None   Collection Time: 07/25/20  6:30 AM   Specimen: Nasal Mucosa; Nasal Swab  Result Value Ref Range Status   MRSA, PCR NEGATIVE NEGATIVE Final   Staphylococcus aureus NEGATIVE NEGATIVE Final    Comment: (NOTE) The Xpert SA Assay (FDA approved for NASAL specimens in patients 63 years of age and older), is one component of a comprehensive surveillance program. It is not intended to diagnose infection nor to guide or monitor treatment. Performed at [facility], [address], [place], [place] [address]         Radiology Studies: No results found.      Scheduled Meds: . amiodarone  200 mg Oral Daily  . aspirin EC  81 mg Oral Daily  . B-complex with vitamin C  1 tablet Oral Daily  . calcitRIOL  0.5 mcg Oral Q M,W,F  . chlorhexidine  15 mL Mouth/Throat Once  . Chlorhexidine Gluconate Cloth  6 each Topical Daily  . cinacalcet  30 mg Oral Q breakfast  . doxazosin  8 mg Oral Daily  . heparin  5,000 Units Subcutaneous Q8H  . hydrALAZINE  75 mg Oral TID  . isosorbide mononitrate  30 mg Oral Daily  . linagliptin  5 mg Oral Daily  . pantoprazole  40 mg Oral BID   Continuous Infusions: . sodium chloride    . sodium chloride 10 mL/hr at 07/28/20 0835     LOS: 10 days     Cordelia Poche, MD [facility] 07/30/2020, 8:18 AM  If 7PM-7AM, please contact night-coverage [email or web address]

## 2020-07-30 NOTE — Procedures (Signed)
     HEMODIALYSIS TREATMENT NOTE:  3 hour heparin-free HD completed.  Bleeding some from [facility] insertion site.  Dressing changed and pressure held along the tunnel.   Difficulty maintaining prescribed flow d/t AP fluctuations.  Average Qb 350.   Kept even / no fluid removed.  Soft pressures.  All blood was returned.  No changes from pre-dialysis assessment.  Cimino AVF +t/b.  Rockwell Alexandria, RN

## 2020-07-30 NOTE — TOC Progression Note (Signed)
Transition of Care [facility]) - Progression Note    Patient Details  Name: Bradley Black MRN: [id number] Date of Birth: 17-Nov-1945  [facility]) CM/SW Contact  Natasha Bence, LCSW Phone Number: 07/30/2020, 3:41 PM  Clinical Narrative:    CSW contacted [facility] 2x for update on patient's admission. CSW LVM. CSW received no response. TOC to follow.  Expected Discharge Plan: [place] Barriers to Discharge: Waiting for outpatient dialysis  Expected Discharge Plan and Services Expected Discharge Plan: [place] In-house Referral: Clinical Social Work Discharge Planning Services: NA Post Acute Care Choice: Home Health Living arrangements for the past 2 months: Single Family Home                 DME Arranged: N/A DME Agency: NA                   Social Determinants of Health (SDOH) Interventions    Readmission Risk Interventions Readmission Risk Prevention Plan 07/26/2020  Transportation Screening Complete  HRI or Home Care Consult Complete  Social Work Consult for [place] Planning/Counseling Complete  Medication Review Press photographer) Complete  Some recent data might be hidden

## 2020-07-31 DIAGNOSIS — E875 Hyperkalemia: Secondary | ICD-10-CM | POA: Diagnosis not present

## 2020-07-31 DIAGNOSIS — R251 Tremor, unspecified: Secondary | ICD-10-CM | POA: Diagnosis not present

## 2020-07-31 DIAGNOSIS — N186 End stage renal disease: Secondary | ICD-10-CM | POA: Diagnosis not present

## 2020-07-31 LAB — GLUCOSE, CAPILLARY
Glucose-Capillary: 90 mg/dL (ref 70–99)
Glucose-Capillary: 127 mg/dL — ABNORMAL HIGH (ref 70–99)
Glucose-Capillary: 147 mg/dL — ABNORMAL HIGH (ref 70–99)

## 2020-07-31 NOTE — Progress Notes (Signed)
[facility] Progress Note     Assessment/ Plan:   1. ESRD- new start to HD and for 3rd HD session today  1. S/p left ciminoAVF placed 07/28/20 2. Awaiting outpatient HD spot with [facility] (has been followed by Dr. Theador Hawthorne as an outpatient) 3. HD #3 on 10/28.   4. Basically had to run even on 10/30; looks relatively euvolemic today. 5. Next  HD Tues; awaiting outpatient schedule.  2. CAD s/p CABG- stable 3. 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 Currently euvolemic.  - Decr hydralazine to 50mg  TID on 07/30/2020. 4. PVC's- stable 5. DM type 2- per primary 6. Anemia of CKD- stable 7. Prolonged QT- per primary 8. Deconditioning - working with PT/OT and per SW will go home with Home health. Stable from renal standpoint for discharge once outpatient HD is arranged.  Subjective:   No new complaints. Tolerated his 4th HD session on 10/30 without issue but unable to UF bec of hypotension.   Objective:   BP (!) 125/48 (BP Location: Right Arm)   Pulse (!) 40   Temp 97.7 F (36.5 C) (Oral)   Resp 18   Ht 5\' 8"  (1.727 m)   Wt 61.5 kg   SpO2 98%   BMI 20.62 kg/m   Intake/Output Summary (Last 24 hours) at 07/31/2020 1309 Last data filed at 07/31/2020 1143 Gross per 24 hour  Intake --  Output 121 ml  Net -121 ml   Weight change:   Physical Exam: Gen:NAD CVS:no rub Resp:cta Abd:+BS, soft, NT/ND Ext:no edema, Left forearm AVF +T/B, RIJ TC   Imaging: No results found.  Labs: BMET Recent Labs  Lab 07/25/20 0611 07/25/20 1517 07/26/20 0444 07/28/20 1703 07/30/20 1238  NA 135 135 133* 133* 132*  K 4.4 4.8 4.2 4.2 3.8  CL 101 103 102 98 97*  CO2 23 20* 22 24 24   GLUCOSE 102* 158* 99 125* 194*  BUN 95* 89* 66* 77* 76*  CREATININE 4.01* 3.97* 3.45* 4.54* 4.91*  CALCIUM 9.1 9.0 8.9 9.0 9.1  PHOS  --  4.6 4.5 4.9* 5.6*   CBC Recent Labs  Lab 07/25/20 0611 07/25/20 0611  07/25/20 1517 07/26/20 0444 07/28/20 1703 07/30/20 1238  WBC 8.2   < > 7.6 9.0 8.9 9.0  NEUTROABS 6.2  --   --  6.8  --   --   HGB 11.4*   < > 11.7* 11.4* 11.3* 11.0*  HCT 35.6*   < > 36.0* 34.6* 34.3* 33.2*  MCV 96.7   < > 97.8 96.9 96.9 96.5  PLT 193   < > 174 154 128* 134*   < > = values in this interval not displayed.    Medications:    . amiodarone  200 mg Oral Daily  . aspirin EC  81 mg Oral Daily  . B-complex with vitamin C  1 tablet Oral Daily  . calcitRIOL  0.5 mcg Oral Q M,W,F  . chlorhexidine  15 mL Mouth/Throat Once  . Chlorhexidine Gluconate Cloth  6 each Topical Daily  . cinacalcet  30 mg Oral Q breakfast  . doxazosin  8 mg Oral Daily  . heparin  5,000 Units Subcutaneous Q8H  . hydrALAZINE  50 mg Oral TID  . isosorbide mononitrate  30 mg Oral Daily  . linagliptin  5 mg Oral Daily  . pantoprazole  40 mg Oral BID      Otelia Santee, MD 07/31/2020,  1:09 PM

## 2020-07-31 NOTE — Progress Notes (Signed)
PROGRESS NOTE    Bradley Black  KVQ:[id number] DOB: Dec 07, 1945 DOA: 07/20/2020 PCP: Susy Frizzle, MD   Brief Narrative: Bradley Black is a 74 y.o. male with a history of MI s/p PCI, CABG, carotid artery stenosis, essential hypertension, diabetes mellitus, type 2, CKD 5, PVCs, NSVT, BPH. Patient presented secondary to jerking motions thoguht to be myoclonus from azotemia. Patient started on HD.   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 Resolved with HD.  ESRD New diagnosis. New hemodialysis. Working on outpatient HD placement -Nephrology recommendations: HD inpatient, Sensipar, calcitriol. Left AV fistula creation on 10/28 -Nephrology recommendations for HD  Hyponatremia In setting of worsened kidney function. Improved with HD. Mild and mostly resolved.  Hyperbilirubinemia Chronic. Recent ultrasound without evidence of cirrhosis. Stable. Outpatient follow-up  Prolonged QTc -Avoid QT prolonging medications  Diabetes mellitus, type 2 Patient is on Januvia as an outpatient -Continue Tradjenta (inpatient)  History of CABG CAD -Continue Imdur, doxazosin, hydralazine  Chronic heart heart failure Stable. Manage fluid with HD.  History of PVCs/NSVT -Continue amiodarone 200 mg daily   DVT prophylaxis: Heparin subq Code Status:   Code Status: Full Code Family Communication: None at bedside Disposition Plan:  Discharge likely in 1+ days pending outpatient HD placement   Consultants:   Nephrology  Vascular surgery  Procedures:   HD  Antimicrobials:  Cefazolin    Subjective: No events overnight.  Objective: Vitals:   07/30/20 1830 07/30/20 1850 07/30/20 2218 07/31/20 0619  BP: 106/67 (!) 102/59 (!) 155/58 (!) 154/77  Pulse: (!) 58 (!) 55 64 63  Resp:   18 18  Temp:   98.6 F (37 C) 99.1 F (37.3 C)  TempSrc:   Oral Oral  SpO2:   99% 98%  Weight:      Height:        Intake/Output Summary (Last 24 hours) at 07/31/2020 0748 Last data filed at 07/30/2020 1850 Gross per 24 hour  Intake --  Output -79 ml  Net 79 ml   Filed Weights   07/21/20 0200 07/28/20 0810 07/28/20 1520  Weight: 61.4 kg 61.4 kg 61.5 kg    Examination:  General: Well appearing, no distress   Data Reviewed: I have personally reviewed following labs and imaging studies  CBC Lab Results  Component Value Date   WBC 9.0 07/30/2020   RBC 3.44 (L) 07/30/2020   HGB 11.0 (L) 07/30/2020   HCT 33.2 (L) 07/30/2020   MCV 96.5 07/30/2020   MCH 32.0 07/30/2020   PLT 134 (L) 07/30/2020   MCHC 33.1 07/30/2020   RDW 13.7 07/30/2020   LYMPHSABS 1.0 07/26/2020   MONOABS 0.8 07/26/2020   EOSABS 0.3 07/26/2020   BASOSABS 0.0 64/33/2951     Last metabolic panel Lab Results  Component Value Date   NA 132 (L) 07/30/2020   K 3.8 07/30/2020   CL 97 (L) 07/30/2020  CO2 24 07/30/2020   BUN 76 (H) 07/30/2020   CREATININE 4.91 (H) 07/30/2020   GLUCOSE 194 (H) 07/30/2020   GFRNONAA 12 (L) 07/30/2020   GFRAA 24 (L) 05/01/2019   CALCIUM 9.1 07/30/2020   PHOS 5.6 (H) 07/30/2020   PROT 7.1 07/21/2020   ALBUMIN 3.4 (L) 07/30/2020   BILITOT 2.0 (H) 07/21/2020   ALKPHOS 92 07/21/2020   AST 24 07/21/2020   ALT 61 (H) 07/21/2020   ANIONGAP 11 07/30/2020    CBG (last 3)  Recent Labs    07/30/20 1131 07/30/20 1635 07/31/20 0734  GLUCAP 149* 105* 90     GFR: Estimated Creatinine Clearance: 11.1  mL/min (A) (by C-G formula based on SCr of 4.91 mg/dL (H)).  Coagulation Profile: No results for input(s): INR, PROTIME in the last 168 hours.  Recent Results (from the past 240 hour(s))  Surgical pcr screen     Status: None   Collection Time: 07/25/20  6:30 AM   Specimen: Nasal Mucosa; Nasal Swab  Result Value Ref Range Status   MRSA, PCR NEGATIVE NEGATIVE Final   Staphylococcus aureus NEGATIVE NEGATIVE Final    Comment: (NOTE) The Xpert SA Assay (FDA approved for NASAL specimens in patients 77 years of age and older), is one component of a comprehensive surveillance program. It is not intended to diagnose infection nor to guide or monitor treatment. Performed at [facility], [address], [place], [place] [address]         Radiology Studies: No results found.      Scheduled Meds:  amiodarone  200 mg Oral Daily   aspirin EC  81 mg Oral Daily   B-complex with vitamin C  1 tablet Oral Daily   calcitRIOL  0.5 mcg Oral Q M,W,F   chlorhexidine  15 mL Mouth/Throat Once   Chlorhexidine Gluconate Cloth  6 each Topical Daily   cinacalcet  30 mg Oral Q breakfast   doxazosin  8 mg Oral Daily   heparin  5,000 Units Subcutaneous Q8H   hydrALAZINE  50 mg Oral TID   isosorbide mononitrate  30 mg Oral Daily   linagliptin  5 mg Oral Daily   pantoprazole  40 mg Oral BID   Continuous Infusions:  sodium chloride     sodium chloride 10 mL/hr at 07/28/20 0835     LOS: 11 days     Cordelia Poche, MD [facility] 07/31/2020, 7:48 AM  If 7PM-7AM, please contact night-coverage [email or web address]

## 2020-08-01 DIAGNOSIS — R251 Tremor, unspecified: Secondary | ICD-10-CM | POA: Diagnosis not present

## 2020-08-01 DIAGNOSIS — N186 End stage renal disease: Secondary | ICD-10-CM | POA: Diagnosis not present

## 2020-08-01 DIAGNOSIS — E875 Hyperkalemia: Secondary | ICD-10-CM | POA: Diagnosis not present

## 2020-08-01 MED ORDER — ACETAMINOPHEN 325 MG PO TABS
650.0000 mg | ORAL_TABLET | Freq: Four times a day (QID) | ORAL | Status: DC | PRN
  Administered 2020-08-01: 650 mg via ORAL
  Filled 2020-08-01: qty 2

## 2020-08-01 MED ORDER — SEVELAMER CARBONATE 800 MG PO TABS
800.0000 mg | ORAL_TABLET | Freq: Three times a day (TID) | ORAL | Status: DC
  Administered 2020-08-01 – 2020-08-02 (×4): 800 mg via ORAL
  Filled 2020-08-01 (×5): qty 1

## 2020-08-01 MED ORDER — HYDRALAZINE HCL 25 MG PO TABS
75.0000 mg | ORAL_TABLET | Freq: Three times a day (TID) | ORAL | Status: DC
  Administered 2020-08-01 (×3): 75 mg via ORAL
  Filled 2020-08-01 (×3): qty 3

## 2020-08-01 MED ORDER — CALCITRIOL 0.25 MCG PO CAPS
0.2500 ug | ORAL_CAPSULE | ORAL | Status: DC
  Administered 2020-08-01: 0.25 ug via ORAL

## 2020-08-01 NOTE — TOC Progression Note (Signed)
Transition of Care [facility]) - Progression Note   Patient Details  Name: Bradley Black MRN: [id number] Date of Birth: [birth year]  Transition of Care [facility]) CM/SW [place], LCSW Phone Number: 08/01/2020, 3:31 PM  Clinical Narrative: CSW called [facility] to follow up with patient's faxed clinicals. CSW faxed hep b surface antigen labs. CSW updated by dialysis RN that patient's paperwork was sent to 2 dialysis centers and both were missing documentation that was faxed to the main [facility] number ([phone]). RN requested the chest x-ray be faxed directly to [facility] and re-faxed to the main [facility] fax. Avoidable days entered. TOC awaiting [facility] to review paperwork for chair assignment.  Expected Discharge Plan: [place] Barriers to Discharge: Waiting for outpatient dialysis  Expected Discharge Plan and Services Expected Discharge Plan: [place] In-house Referral: Clinical Social Work Discharge Planning Services: NA Post Acute Care Choice: [place] arrangements for the past 2 months: Single Family Home             DME Arranged: N/A DME Agency: NA  Readmission Risk Interventions Readmission Risk Prevention Plan 07/26/2020  Transportation Screening Complete  HRI or Home Care Consult Complete  Social Work Consult for [place] Planning/Counseling Complete  Medication Review Press photographer) Complete  Some recent data might be hidden

## 2020-08-01 NOTE — Progress Notes (Signed)
PROGRESS NOTE    Bradley Black  GLO:[id number] DOB: 1945/11/19 DOA: 07/20/2020 PCP: Susy Frizzle, MD   Brief Narrative: Bradley Black is a 74 y.o. male with a history of MI s/p PCI, CABG, carotid artery stenosis, essential hypertension, diabetes mellitus, type 2, CKD 5, PVCs, NSVT, BPH. Patient presented secondary to jerking motions thoguht to be myoclonus from azotemia. Patient started on HD.   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 Resolved with HD.  ESRD New diagnosis. New hemodialysis. Working on outpatient HD placement -Nephrology recommendations: HD inpatient, Sensipar, calcitriol. Left AV fistula creation on 10/28 -Nephrology recommendations for HD  Hyponatremia In setting of worsened kidney function. Improved with HD. Mild and mostly resolved.  Hyperbilirubinemia Chronic. Recent ultrasound without evidence of cirrhosis. Stable. Outpatient follow-up  Prolonged QTc -Avoid QT prolonging medications  Diabetes mellitus, type 2 Patient is on Januvia as an outpatient -Continue Tradjenta (inpatient)  History of CABG CAD -Continue Imdur, doxazosin, hydralazine  Chronic heart heart failure Stable. Manage fluid with HD.  History of PVCs/NSVT -Continue amiodarone 200 mg daily  Thrombocytopenia Mild and appears transient  Primary hypertension Patient is on Imdur and hydralazine as an outpatient.  Uncontrolled. -Continue Imdur and increase back to home dose of hydralazine 75 mg TID   DVT prophylaxis: Heparin subq Code Status:   Code Status: Full Code Family Communication: None at bedside Disposition Plan: Discharge pending outpatient HD placement   Consultants:   Nephrology  Vascular surgery  Procedures:   HD  Antimicrobials:  Cefazolin    Subjective: No issues overnight.  Objective: Vitals:   07/31/20 1249 07/31/20 2013 07/31/20 2349 08/01/20 0422  BP: (!) 125/48 (!) 136/52 (!) 154/54 (!) 144/49  Pulse: (!) 40 64 70 61  Resp: 18 18 16 18   Temp: 97.7 F (36.5 C) 97.9 F (36.6 C) 98.4 F (36.9 C) 97.9 F (36.6 C)  TempSrc: Oral Oral Oral Oral  SpO2: 98% 98% 100% 98%  Weight:      Height:        Intake/Output Summary (Last 24 hours) at 08/01/2020 0804 Last data filed at 07/31/2020 1143 Gross per 24 hour  Intake --  Output 200 ml  Net -200 ml   Filed Weights   07/21/20 0200 07/28/20 0810 07/28/20 1520  Weight: 61.4 kg 61.4 kg 61.5 kg    Examination:  General: Well appearing, no distress   Data Reviewed: I have personally reviewed following labs and imaging studies  CBC Lab Results  Component Value Date   WBC 9.0 07/30/2020   RBC 3.44 (L) 07/30/2020   HGB 11.0 (L) 07/30/2020   HCT 33.2 (L) 07/30/2020   MCV 96.5 07/30/2020   MCH 32.0 07/30/2020   PLT 134 (L) 07/30/2020   MCHC 33.1 07/30/2020   RDW 13.7 07/30/2020   LYMPHSABS 1.0 07/26/2020   MONOABS 0.8 07/26/2020   EOSABS 0.3 07/26/2020   BASOSABS  0.0 50/56/9794     Last metabolic panel Lab Results  Component Value Date   NA 132 (L) 07/30/2020   K 3.8 07/30/2020   CL 97 (L) 07/30/2020   CO2 24 07/30/2020   BUN 76 (H) 07/30/2020   CREATININE 4.91 (H) 07/30/2020   GLUCOSE 194 (H) 07/30/2020   GFRNONAA 12 (L) 07/30/2020   GFRAA 24 (L) 05/01/2019   CALCIUM 9.1 07/30/2020   PHOS 5.6 (H) 07/30/2020   PROT 7.1 07/21/2020   ALBUMIN 3.4 (L) 07/30/2020   BILITOT 2.0 (H)  07/21/2020   ALKPHOS 92 07/21/2020   AST 24 07/21/2020   ALT 61 (H) 07/21/2020   ANIONGAP 11 07/30/2020    CBG (last 3)  Recent Labs    07/31/20 0734 07/31/20 1140 07/31/20 1629  GLUCAP 90 127* 147*     GFR: Estimated Creatinine Clearance: 11.1 mL/min (A) (by C-G formula based on SCr of 4.91 mg/dL (H)).  Coagulation Profile: No results for input(s): INR, PROTIME in the last 168 hours.  Recent Results (from the past 240 hour(s))  Surgical pcr screen     Status: None   Collection Time: 07/25/20  6:30 AM   Specimen: Nasal Mucosa; Nasal Swab  Result Value Ref Range Status   MRSA, PCR NEGATIVE NEGATIVE Final   Staphylococcus aureus NEGATIVE NEGATIVE Final    Comment: (NOTE) The Xpert SA Assay (FDA approved for NASAL specimens in patients 33 years of age and older), is one component of a comprehensive surveillance program. It is not intended to diagnose infection nor to guide or monitor treatment. Performed at [facility], [address] [place], [place] [address]         Radiology Studies: No results found.      Scheduled Meds: . amiodarone  200 mg Oral Daily  . aspirin EC  81 mg Oral Daily  . B-complex with vitamin C  1 tablet Oral Daily  . calcitRIOL  0.5 mcg Oral Q M,W,F  . chlorhexidine  15 mL Mouth/Throat Once  . Chlorhexidine Gluconate Cloth  6 each Topical Daily  . cinacalcet  30 mg Oral Q breakfast  . doxazosin  8 mg Oral Daily  . heparin  5,000 Units Subcutaneous Q8H  . hydrALAZINE  50 mg Oral TID  . isosorbide mononitrate  30 mg Oral Daily  . linagliptin  5 mg Oral Daily  . pantoprazole  40 mg Oral BID   Continuous Infusions: . sodium chloride    . sodium chloride 10 mL/hr at 07/28/20 0835     LOS: 12 days     Cordelia Poche, MD [facility] 08/01/2020, 8:04 AM  If 7PM-7AM, please contact night-coverage [email or web address]

## 2020-08-01 NOTE — Progress Notes (Signed)
  [facility] Progress Note     Assessment/ Plan:   1. ESRD- new start to HD and for 4rd HD session on 10/30 1. S/p left ciminoAVF placed 07/28/20 2. Awaiting outpatient HD spot with [facility] (has been followed by Dr. Theador Hawthorne as an outpatient) per SW 3. HD #4 on 10/30.   4. Basically had to run even on 10/30; looks relatively euvolemic today. 5. Next  HD  #5  due Tues; awaiting outpatient schedule.  Will do here if still here tomorrow  2. CAD s/p CABG- stable 3. 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 Currently euvolemic.  - Decr hydralazine to 50mg  TID on 07/30/2020. 4. PVC's- stable 5. DM type 2- per primary 6. Anemia of CKD- stable- at or over 11-  No meds needed 7. Prolonged QT- per primary 8. Bones-  PTH 48 - on calcitriol 0.5 TIW and sensipar 30 daily- will dec vitamin d-  Phos 5.6-  Will start renvela 9. Deconditioning - working with PT/OT and per SW will go home with Home health. Stable from renal standpoint for discharge once outpatient HD is arranged.  Subjective:   No new complaints. He is ready to go home- felt OK before starting dialysis and feels OK now   Objective:   BP (!) 144/49 (BP Location: Right Arm)   Pulse 61   Temp 97.9 F (36.6 C) (Oral)   Resp 18   Ht 5\' 8"  (1.727 m)   Wt 61.5 kg   SpO2 98%   BMI 20.62 kg/m   Intake/Output Summary (Last 24 hours) at 08/01/2020 2423 Last data filed at 07/31/2020 1143 Gross per 24 hour  Intake --  Output 200 ml  Net -200 ml   Weight change:   Physical Exam: Gen:NAD CVS:no rub Resp:cta Abd:+BS, soft, NT/ND Ext:no edema, Left forearm AVF +T/B, RIJ TC   Imaging: No results found.  Labs: BMET Recent Labs  Lab 07/25/20 1517 07/26/20 0444 07/28/20 1703 07/30/20 1238  NA 135 133* 133* 132*  K 4.8 4.2 4.2 3.8  CL 103 102 98 97*  CO2 20* 22 24 24   GLUCOSE 158* 99 125* 194*  BUN 89* 66* 77* 76*  CREATININE 3.97* 3.45*  4.54* 4.91*  CALCIUM 9.0 8.9 9.0 9.1  PHOS 4.6 4.5 4.9* 5.6*   CBC Recent Labs  Lab 07/25/20 1517 07/26/20 0444 07/28/20 1703 07/30/20 1238  WBC 7.6 9.0 8.9 9.0  NEUTROABS  --  6.8  --   --   HGB 11.7* 11.4* 11.3* 11.0*  HCT 36.0* 34.6* 34.3* 33.2*  MCV 97.8 96.9 96.9 96.5  PLT 174 154 128* 134*    Medications:    . amiodarone  200 mg Oral Daily  . aspirin EC  81 mg Oral Daily  . B-complex with vitamin C  1 tablet Oral Daily  . calcitRIOL  0.5 mcg Oral Q M,W,F  . chlorhexidine  15 mL Mouth/Throat Once  . Chlorhexidine Gluconate Cloth  6 each Topical Daily  . cinacalcet  30 mg Oral Q breakfast  . doxazosin  8 mg Oral Daily  . heparin  5,000 Units Subcutaneous Q8H  . hydrALAZINE  75 mg Oral TID  . isosorbide mononitrate  30 mg Oral Daily  . linagliptin  5 mg Oral Daily  . pantoprazole  40 mg Oral BID    Louis Meckel  08/01/2020, 8:23 AM

## 2020-08-01 NOTE — Progress Notes (Signed)
     NEPHROLOGY NURSING NOTE:  Attempted to help with discharge planning by directly contacting medical director of [facility], Dr. Theador Hawthorne, to ascertain what documentation is missing for outpatient chair assignment.  No answer at [facility], or [facility] clinic.  TOC team has sent all requested documentation to both the [facility] facility and [place] [facility] corporate office.  Rockwell Alexandria, RN

## 2020-08-01 NOTE — Telephone Encounter (Signed)
Noted, Erline Levine please arrange 3 month apt to reassessment.

## 2020-08-02 ENCOUNTER — Encounter: Payer: Self-pay | Admitting: Internal Medicine

## 2020-08-02 DIAGNOSIS — N185 Chronic kidney disease, stage 5: Secondary | ICD-10-CM | POA: Diagnosis not present

## 2020-08-02 DIAGNOSIS — E860 Dehydration: Secondary | ICD-10-CM | POA: Diagnosis not present

## 2020-08-02 DIAGNOSIS — R251 Tremor, unspecified: Secondary | ICD-10-CM | POA: Diagnosis not present

## 2020-08-02 DIAGNOSIS — I2510 Atherosclerotic heart disease of native coronary artery without angina pectoris: Secondary | ICD-10-CM | POA: Diagnosis not present

## 2020-08-02 LAB — RENAL FUNCTION PANEL
Albumin: 3.6 g/dL (ref 3.5–5.0)
Anion gap: 11 (ref 5–15)
BUN: 80 mg/dL — ABNORMAL HIGH (ref 8–23)
CO2: 23 mmol/L (ref 22–32)
Calcium: 9.2 mg/dL (ref 8.9–10.3)
Chloride: 98 mmol/L (ref 98–111)
Creatinine, Ser: 4.62 mg/dL — ABNORMAL HIGH (ref 0.61–1.24)
GFR, Estimated: 12 mL/min — ABNORMAL LOW (ref >60)
Glucose, Bld: 140 mg/dL — ABNORMAL HIGH (ref 70–99)
Phosphorus: 4.6 mg/dL (ref 2.5–4.6)
Potassium: 4.3 mmol/L (ref 3.5–5.1)
Sodium: 132 mmol/L — ABNORMAL LOW (ref 135–145)

## 2020-08-02 LAB — CBC
HCT: 34 % — ABNORMAL LOW (ref 39.0–52.0)
Hemoglobin: 11 g/dL — ABNORMAL LOW (ref 13.0–17.0)
MCH: 31.3 pg (ref 26.0–34.0)
MCHC: 32.4 g/dL (ref 30.0–36.0)
MCV: 96.9 fL (ref 80.0–100.0)
Platelets: 148 10*3/uL — ABNORMAL LOW (ref 150–400)
RBC: 3.51 MIL/uL — ABNORMAL LOW (ref 4.22–5.81)
RDW: 13.6 % (ref 11.5–15.5)
WBC: 7.3 10*3/uL (ref 4.0–10.5)
nRBC: 0 % (ref 0.0–0.2)

## 2020-08-02 MED ORDER — HYDRALAZINE HCL 25 MG PO TABS: 50.0000 mg | ORAL_TABLET | Freq: Three times a day (TID) | ORAL | Status: DC

## 2020-08-02 MED ORDER — SEVELAMER CARBONATE 800 MG PO TABS: 800.0000 mg | ORAL_TABLET | Freq: Three times a day (TID) | ORAL | 2 refills | Status: AC

## 2020-08-02 MED ORDER — HEPARIN SODIUM (PORCINE) 1000 UNIT/ML DIALYSIS
20.0000 [IU]/kg | INTRAMUSCULAR | Status: DC | PRN
  Filled 2020-08-02: qty 2

## 2020-08-02 MED ORDER — CHLORHEXIDINE GLUCONATE CLOTH 2 % EX PADS: 6.0000 | MEDICATED_PAD | Freq: Every day | CUTANEOUS | Status: DC

## 2020-08-02 MED ORDER — HYDRALAZINE HCL 50 MG PO TABS
50.0000 mg | ORAL_TABLET | Freq: Three times a day (TID) | ORAL | 0 refills | Status: DC
Start: 2020-08-02 — End: 2020-11-16

## 2020-08-02 NOTE — Progress Notes (Signed)
  [facility] Progress Note     Assessment/ Plan:   1. ESRD- new start to HD and for 4rd HD session on 10/30 1. S/p left ciminoAVF placed 07/28/20 2. Awaiting outpatient HD spot with [facility] (has been followed by Dr. Theador Hawthorne as an outpatient) per SW 3. HD #4 on 10/30.  due today for HD #5 4. Basically had to run even on 10/30; looks relatively euvolemic today. 5. Next  HD  #5  due Tues/today; awaiting outpatient schedule.  Will do here   2. CAD s/p CABG- stable 3. 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 Currently euvolemic.  - Decr hydralazine to 50mg  TID on 07/30/2020. 4. PVC's- stable 5. DM type 2- per primary 6. Anemia of CKD- stable- at or over 11-  No meds needed 7. Prolonged QT- per primary 8. Bones-  PTH 48 - on calcitriol 0.5 TIW and sensipar 30 daily- will dec vitamin d-  Phos 5.6-  Will start renvela 9. Deconditioning - working with PT/OT and per SW will go home with Home health. Stable from renal standpoint for discharge once outpatient HD is arranged.  Subjective:   No new complaints. He is ready to go home- felt OK before starting dialysis and feels OK now- due for treatment #5 today -  Then I really hope that he can go and f/u at the op center    Objective:   BP (!) 159/54 (BP Location: Right Arm)   Pulse 65   Temp 98.2 F (36.8 C) (Oral)   Resp 18   Ht 5\' 8"  (1.727 m)   Wt 61.5 kg   SpO2 97%   BMI 20.62 kg/m   Intake/Output Summary (Last 24 hours) at 08/02/2020 0803 Last data filed at 08/02/2020 0604 Gross per 24 hour  Intake --  Output 550 ml  Net -550 ml   Weight change:   Physical Exam: Gen:NAD CVS:no rub Resp:cta Abd:+BS, soft, NT/ND Ext:no edema, Left forearm AVF +T/B, RIJ TC   Imaging: No results found.  Labs: BMET Recent Labs  Lab 07/28/20 1703 07/30/20 1238  NA 133* 132*  K 4.2 3.8  CL 98 97*  CO2 24 24  GLUCOSE 125* 194*  BUN 77* 76*   CREATININE 4.54* 4.91*  CALCIUM 9.0 9.1  PHOS 4.9* 5.6*   CBC Recent Labs  Lab 07/28/20 1703 07/30/20 1238  WBC 8.9 9.0  HGB 11.3* 11.0*  HCT 34.3* 33.2*  MCV 96.9 96.5  PLT 128* 134*    Medications:    . amiodarone  200 mg Oral Daily  . aspirin EC  81 mg Oral Daily  . B-complex with vitamin C  1 tablet Oral Daily  . calcitRIOL  0.25 mcg Oral Q M,W,F  . chlorhexidine  15 mL Mouth/Throat Once  . Chlorhexidine Gluconate Cloth  6 each Topical Daily  . cinacalcet  30 mg Oral Q breakfast  . doxazosin  8 mg Oral Daily  . heparin  5,000 Units Subcutaneous Q8H  . hydrALAZINE  75 mg Oral TID  . isosorbide mononitrate  30 mg Oral Daily  . linagliptin  5 mg Oral Daily  . pantoprazole  40 mg Oral BID  . sevelamer carbonate  800 mg Oral TID WC    Reneisha Stilley A Blossie Raffel  08/02/2020, 8:03 AM

## 2020-08-02 NOTE — Progress Notes (Signed)
PROGRESS NOTE    Bradley Black  SWF:[id number] DOB: 12/05/1945 DOA: 07/20/2020 PCP: Susy Frizzle, MD   Brief Narrative: Bradley Black is a 74 y.o. male with a history of MI s/p PCI, CABG, carotid artery stenosis, essential hypertension, diabetes mellitus, type 2, CKD 5, PVCs, NSVT, BPH. Patient presented secondary to jerking motions thoguht to be myoclonus from azotemia. Patient started on HD.   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 Resolved with HD.  ESRD New diagnosis. New hemodialysis. Working on outpatient HD placement -Nephrology recommendations: HD inpatient, Sensipar, calcitriol. Left AV fistula creation on 10/28 -Nephrology recommendations for HD sessions  Hyponatremia In setting of worsened kidney function. Improved with HD. Mild and mostly resolved.  Hyperbilirubinemia Chronic. Recent ultrasound without evidence of cirrhosis. Stable. Outpatient follow-up  Prolonged QTc -Avoid QT prolonging medications  Diabetes mellitus, type 2 Patient is on Januvia as an outpatient -Continue Tradjenta (inpatient)  History of CABG CAD -Continue Imdur, doxazosin, hydralazine  Chronic heart heart failure Stable. Manage fluid with HD.  History of PVCs/NSVT -Continue amiodarone 200 mg daily  Thrombocytopenia Mild and appears transient  Primary hypertension Patient is on Imdur and hydralazine as an  outpatient. Uncontrolled. -Continue Imdur and increase back to home dose of hydralazine 75 mg TID   DVT prophylaxis: Heparin subq Code Status:   Code Status: Full Code Family Communication: None at bedside Disposition Plan: Discharge pending outpatient HD placement   Consultants:   Nephrology  Vascular surgery  Procedures:   HD  Antimicrobials:  Cefazolin    Subjective: No issues.  Objective: Vitals:   08/01/20 0938 08/01/20 1431 08/01/20 2105 08/02/20 0602  BP: (!) 130/47 (!) 123/42 129/68 (!) 159/54  Pulse:   80 65  Resp:  18  18  Temp:  98 F (36.7 C) 97.8 F (36.6 C) 98.2 F (36.8 C)  TempSrc:  Oral Oral Oral  SpO2:  100% 90% 97%  Weight:      Height:        Intake/Output Summary (Last 24 hours) at 08/02/2020 0823 Last data filed at 08/02/2020 0604 Gross per 24 hour  Intake --  Output 550 ml  Net -550 ml   Filed Weights   07/21/20 0200 07/28/20 0810 07/28/20 1520  Weight: 61.4 kg 61.4 kg 61.5 kg    Examination:  General exam: Appears calm and comfortable Respiratory system: Clear to auscultation. Respiratory effort normal. Cardiovascular system: S1 & S2 heard, RRR. No murmurs, rubs, gallops or clicks. Left forearm AVF with bruit and palpable thrill Gastrointestinal system: Abdomen is nondistended, soft and nontender. No organomegaly or masses felt. Normal bowel sounds heard. Central nervous system: Alert and oriented. No focal neurological deficits. Musculoskeletal: No edema. No calf tenderness Skin: No cyanosis. No rashes Psychiatry: Judgement and insight appear normal. Mood & affect appropriate.    Data Reviewed: I have personally reviewed following labs and imaging studies  CBC Lab Results  Component Value Date   WBC 9.0 07/30/2020   RBC 3.44 (L) 07/30/2020   HGB 11.0 (L) 07/30/2020   HCT 33.2 (L) 07/30/2020   MCV 96.5 07/30/2020   MCH 32.0 07/30/2020   PLT 134 (L) 07/30/2020   MCHC 33.1 07/30/2020   RDW 13.7 07/30/2020    LYMPHSABS 1.0 07/26/2020   MONOABS 0.8 07/26/2020   EOSABS 0.3 07/26/2020   BASOSABS 0.0 99/83/3825     Last metabolic panel Lab Results  Component Value Date   NA 132 (L) 07/30/2020   K 3.8 07/30/2020   CL 97 (L) 07/30/2020   CO2 24 07/30/2020   BUN 76 (H) 07/30/2020   CREATININE 4.91 (H) 07/30/2020   GLUCOSE 194 (H) 07/30/2020   GFRNONAA 12 (L) 07/30/2020   GFRAA 24 (L) 05/01/2019   CALCIUM 9.1 07/30/2020   PHOS 5.6 (H) 07/30/2020   PROT 7.1 07/21/2020   ALBUMIN 3.4 (L) 07/30/2020   BILITOT 2.0 (H) 07/21/2020   ALKPHOS 92 07/21/2020   AST 24 07/21/2020   ALT 61 (H) 07/21/2020   ANIONGAP 11 07/30/2020    CBG (last 3)  Recent Labs    07/31/20 0734 07/31/20 1140 07/31/20 1629  GLUCAP 90 127* 147*     GFR: Estimated Creatinine Clearance: 11.1 mL/min (A) (by C-G formula based on SCr of 4.91 mg/dL (H)).  Coagulation Profile: No results for input(s): INR, PROTIME in the last 168 hours.  Recent Results (from the past 240 hour(s))  Surgical pcr screen     Status: None   Collection Time: 07/25/20  6:30 AM   Specimen: Nasal Mucosa; Nasal Swab  Result Value Ref Range Status   MRSA, PCR NEGATIVE NEGATIVE Final   Staphylococcus aureus NEGATIVE NEGATIVE Final    Comment: (NOTE) The Xpert SA Assay (FDA approved for NASAL specimens in patients 35 years of age and older), is one component of a comprehensive surveillance program. It is not intended to diagnose infection nor to guide or monitor treatment. Performed at [facility], [address], [place], [place] [address]         Radiology Studies: No results found.      Scheduled Meds: . amiodarone  200 mg Oral Daily  . aspirin EC  81 mg Oral Daily  . B-complex with vitamin C  1 tablet Oral Daily  . calcitRIOL  0.25 mcg Oral Q M,W,F  . chlorhexidine  15 mL Mouth/Throat Once  . Chlorhexidine Gluconate Cloth  6 each Topical Daily  . Chlorhexidine Gluconate Cloth  6 each Topical Q0600  . cinacalcet  30 mg  Oral Q breakfast  . doxazosin  8 mg Oral Daily  . heparin  5,000 Units Subcutaneous Q8H  . hydrALAZINE  75 mg Oral TID  . isosorbide mononitrate  30 mg Oral Daily  . linagliptin  5 mg Oral Daily  . pantoprazole  40 mg Oral BID  . sevelamer carbonate  800 mg Oral TID WC   Continuous Infusions: . sodium chloride    . sodium chloride 10 mL/hr at 07/28/20 0835     LOS: 13 days     Cordelia Poche, MD [facility] 08/02/2020, 8:23 AM  If 7PM-7AM, please contact night-coverage [email or web address]

## 2020-08-02 NOTE — Discharge Summary (Addendum)
Physician Discharge Summary  Jaquane KAWON WILLCUTT KNL:[id number] DOB: 1946-06-07 DOA: 07/20/2020  PCP: Susy Frizzle, MD  Admit date: 07/20/2020 Discharge date: 08/02/2020  Admitted From: Home Disposition: Home  Recommendations for Outpatient Follow-up:  1. Follow up with PCP in 1 week 2. Please follow up on the following pending results: None  Home Health: PT Equipment/Devices: None  Discharge Condition: [place] CODE STATUS: Full code Diet recommendation: Renal diet   Brief/Interim Summary:  Admission HPI written by Bernadette Hoit, DO   Chief Complaint: Involuntary shaking of extremities  HPI: Bradley Black is a 74 y.o. male with medical history significant for ischemic cardiomyopathy-systolic and diastolic CHF, hypertension, diabetes mellitus and CKD5 who presents to the emergency department due to recurrent and ongoing uncontrollable tremors that has been ongoing since being discharged yesterday (10/19).  Patient was admitted on 10/18 due to uncontrollable tremors/myoclonic jerks that started on 10/17 and he was referred to the ED by patient's cardiologist due to concern for worsening kidney function per medical record. Patient was unable to provide history, history was provided by wife at bedside, per wife, on returning home yesterday (s/p discharge), patient continued to have some shaking and jerking movements in hands and legs, patient did not get enough sleep last night due to the jerking movements, he was supposed to follow-up with his nephrologist this afternoon at 3 PM, but due to weakness and continued shaking and jerking movements, wife called nephrologist office who spoke with wife by FaceTime, and on seeing patient via FaceTime with the mentioned jerking movements, he asked patient's wife to activate 911 and that patient will require inpatient nephrologist. Patient was taken back to the ED by EMS team for further evaluation. Prior to being discharged yesterday (10/19),  patient's electrolytes were repleted, creatinine was 4.5 on discharge (patient's baseline creatinine 3.8-4.6), he was continued on his current dose of diuretics (60 mg of torsemide daily) and was discharged home with home health PT and use of a rolling walker.  He was seen by neurologist (Dr. Merlene Laughter) and diagnosed to have continuous myoclonus and negative myoclonus which was suspected to be due to toxic metabolic derangements secondary to medication effect (SSRI citalopram) and renal failure, EEG done showed a normal recording of awake and drowsy states.  MRI of brain done also showed no acute abnormality.   Hospital course:  Myoclonic jerks Generalized weakness Secondary to ESRD. Resolved with HD.  ESRD New diagnosis. New hemodialysis. Nephrology was consulted for management. Vascular surgery placed left  AV fistula on 10/28.   Hyponatremia In setting of worsened kidney function. Improved with HD. Mild and mostly resolved.  Hyperbilirubinemia Chronic. Recent ultrasound without evidence of cirrhosis. Stable. Outpatient follow-up  Prolonged QTc Avoid QT prolonging medications  Diabetes mellitus, type 2 Patient is on Januvia as an outpatient  History of CABG CAD Continue Imdur, doxazosin. Decrease to hydralazine 50 mg TID  Chronic systolic heart heart failure Stable. Manage fluid with HD. EF of 25%.  Metabolic bone disease Continue Sensipar and calcitriol. Renvela started for associated hyperphosphatemia.  History of PVCs/NSVT Continue amiodarone 200 mg daily  Thrombocytopenia Mild and appears transient  Primary hypertension Patient is on Imdur and hydralazine as an outpatient. Uncontrolled. 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   Ischemic cardiomyopathy   Essential hypertension   Type 2 diabetes mellitus with diabetic chronic  kidney disease (HCC)   Hyperkalemia   GERD (gastroesophageal reflux disease)   Myoclonic jerking   Hypocalcemia   CKD (chronic kidney disease), stage V (HCC)   Dehydration   Hyponatremia   Prolonged QT interval   Generalized weakness   Malnutrition of moderate degree   ESRD (end stage renal disease) [facility])    Discharge Instructions  Discharge Instructions    Increase activity slowly   Complete by: As directed    No wound care   Complete by: As directed      Allergies as of 08/02/2020   No Known Allergies     Medication List    STOP taking these medications   sodium bicarbonate 650 MG tablet   torsemide 20 MG tablet Commonly known as: Demadex     TAKE these medications   acetaminophen 500 MG tablet Commonly known as: TYLENOL Take 1,000 mg by mouth every 6 (six) hours as needed for moderate pain or headache.   amiodarone 200 MG tablet Commonly known as: PACERONE Take 1 tablet (200 mg total) by mouth daily.   aspirin EC 81 MG tablet Take 81 mg by mouth daily.   calcitRIOL 0.5 MCG capsule Commonly known as: ROCALTROL Take 1 capsule (0.5 mcg total) by mouth every Monday, Wednesday, and Friday.   cinacalcet 30 MG tablet Commonly known as: SENSIPAR Take 30 mg by mouth daily with breakfast.   doxazosin 8 MG tablet Commonly known as: CARDURA Take 1 tablet (8 mg total) by mouth daily.   hydrALAZINE 50 MG tablet Commonly known as: APRESOLINE Take 1 tablet (50 mg total) by mouth 3 (three) times daily. What changed: how much to take   isosorbide mononitrate 30 MG 24 hr tablet Commonly known as: IMDUR Take 1 tablet (30 mg total) by mouth daily.   pantoprazole 40 MG tablet Commonly known as: PROTONIX TAKE ONE TABLET BY MOUTH TWICE A DAY   sevelamer carbonate 800 MG tablet Commonly known as: RENVELA Take 1 tablet (800 mg total) by mouth 3 (three) times daily with meals.   sildenafil 100 MG tablet Commonly known as: Viagra Take 0.5-1 tablets (50-100 mg  total) by mouth daily as needed for erectile dysfunction.   sitaGLIPtin 50 MG tablet Commonly known as: JANUVIA Take 25 mg by mouth daily.       No Known Allergies  Consultations:  Nephrology   Procedures/Studies: MR BRAIN WO CONTRAST  Result Date: 07/18/2020 CLINICAL DATA:  Acute neuro deficit.  Acute onset of tremors. EXAM: MRI HEAD WITHOUT CONTRAST TECHNIQUE: Multiplanar, multiecho pulse sequences of the brain and surrounding structures were obtained without intravenous contrast. COMPARISON:  MRI head 05/06/2018 FINDINGS: Brain: Moderate atrophy unchanged. Negative for hydrocephalus. Periventricular deep white matter hyperintensities bilaterally unchanged Negative for acute infarct, hemorrhage, mass Vascular: Normal arterial flow voids Skull and upper cervical spine: No focal skeletal lesion. Sinuses/Orbits: Paranasal sinuses clear.  Negative orbit Other: None IMPRESSION: No acute abnormality Moderate atrophy. Mild to moderate white matter changes consistent with chronic microvascular ischemia. Electronically Signed   By: Franchot Gallo M.D.   On: 07/18/2020 18:59   CARDIAC CATHETERIZATION  Result Date: 07/12/2020  Mildly elevated mean pulmonary artery pressure at 21 mmHg.  This is consistent with mild pulmonary hypertension.  Pulmonary capillary wedge pressure mean is 11 mmHg.  Cardiac output 4.6 L/min.  Main pulmonary artery O2 saturation 64%.  Arterial pulse oximetry O2 saturation 99%.  Pulmonary vascular resistance 2.16 Wood units.  US RENAL  Result Date: 07/11/2020 CLINICAL DATA:  Acute  kidney injury. EXAM: RENAL / URINARY TRACT ULTRASOUND COMPLETE COMPARISON:  03/14/2017. FINDINGS: Right Kidney: Renal measurements: 8.5 x 4.6 x 3.5 cm = volume: 72 mL. Diffusely increased echogenicity without hydronephrosis. Left Kidney: Renal measurements: 9.8 x 5.4 x 4.5 cm = volume: 126 mL. Echogenic parenchyma with 2.9 cm cystic lesion interpolar region not substantially changed from 2.6  cm on ultrasound exam of 03/14/2017. Bladder: Appears normal for degree of bladder distention. Other: None. IMPRESSION: 1. No acute findings. No hydronephrosis. 2. 2.9 cm cyst interpolar region left kidney not substantially changed since 2018. 3. Increased echogenicity of both kidneys compatible with medical renal disease. Electronically Signed   By: Misty Stanley M.D.   On: 07/11/2020 10:53   [place] UE VEIN MAPPING LEFT (PRE-OP AVF)  Result Date: 07/26/2020 CLINICAL DATA:  74 year old male presents for vascular mapping EXAM: [place] EXTREM UP VEIN MAPPING COMPARISON:  None. FINDINGS: RIGHT ARTERIES Wrist Radial Artery: Size 31mm Waveform triphasic Wrist Ulnar Artery: Size 52mm Waveform triphasic Prox. Forearm Radial Artery: Size 32mm Waveform triphasic Upper Arm Brachial Artery: Size 70mm Waveform triphasic RIGHT VEINS Forearm Cephalic Vein: Prox: IV present, distal 55mm Depth 53mm Upper Arm Cephalic Vein: Prox 76mm Distal 30mm Depth 37mm Upper Arm Basilic Vein: Prox 56mm Distal 78mm Depth 56mm Upper Arm Brachial Vein: Prox 7mm Distal 11mm Depth 19mm ADDITIONAL RIGHT VEINS Axillary Vein:  65mm Subclavian Vein: Patient: Yes respiratory Phasicity: Yes Internal Jugular Vein: Patent: Yes respiratory Phasicity: Yes LEFT ARTERIES Wrist Radial Artery: Size 21mm Waveform triphasic Wrist Ulnar Artery: Size 28mm Waveform triphasic Prox. Forearm Radial Artery: Size 17mm Waveform triphasic Upper Arm Brachial Artery: Size 56mm Waveform triphasic LEFT VEINS Forearm Cephalic Vein: Prox 51mm Distal 64mm Depth 58mm Upper Arm Cephalic Vein: Prox 47mm Distal 97mm Depth 64mm Upper Arm Basilic Vein: Prox 5mm Distal 70mm Depth 69mm Upper Arm Brachial Vein: Prox 17mm Distal 30mm Depth 38mm ADDITIONAL LEFT VEINS Axillary Vein:  32mm Subclavian Vein: Patient: Yes respiratory Phasicity: Yes Internal Jugular Vein: Patent: Yes respiratory Phasicity: Yes IMPRESSION: Bilateral upper extremity vascular mapping, as above. Signed, Dulcy Fanny. Dellia Nims, RPVI Vascular and  [facility] Electronically Signed   By: Corrie Mckusick D.O.   On: 07/26/2020 13:33   DG Chest Port 1 View  Result Date: 07/25/2020 CLINICAL DATA:  Dialysis catheter placement EXAM: PORTABLE CHEST 1 VIEW COMPARISON:  07/25/2020 FINDINGS: The right IJ dialysis catheter is in good position without complicating features. The distal port is at the cavoatrial junction. The cardiac silhouette, mediastinal and hilar contours are within normal limits and stable. Stable surgical changes from triple bypass surgery. The lungs are clear. No pleural effusions or pulmonary edema. No worrisome pulmonary lesions. The bony thorax is intact. IMPRESSION: Right IJ dialysis catheter in good position without complicating features. Electronically Signed   By: Marijo Sanes M.D.   On: 07/25/2020 14:37   DG Chest Port 1 View  Result Date: 07/25/2020 CLINICAL DATA:  Preop.  Status post coronary bypass graft. EXAM: PORTABLE CHEST 1 VIEW COMPARISON:  July 08, 2020. FINDINGS: The heart size and mediastinal contours are within normal limits. Both lungs are clear. The visualized skeletal structures are unremarkable. IMPRESSION: No active disease. Electronically Signed   By: Marijo Conception M.D.   On: 07/25/2020 10:17   DG Chest Port 1 View  Result Date: 07/08/2020 CLINICAL DATA:  Shortness of breath EXAM: PORTABLE CHEST 1 VIEW COMPARISON:  01/21/2012, 12/31/2011 FINDINGS: Post CABG changes. Stable mild cardiomegaly. There is mild pulmonary vascular  congestion and prominent interstitial markings. No large pleural fluid collection. No pneumothorax. IMPRESSION: Findings suggestive of CHF with mild interstitial edema. Electronically Signed   By: Davina Poke D.O.   On: 07/08/2020 14:12   EEG adult  Result Date: 07/19/2020 Phillips Odor, MD     07/19/2020  6:03 PM [place] A. Merlene Laughter, MD     [email or web address]       HISTORY: This is a 74 year old who presents  with multiple movements suspicious for seizures. MEDICATIONS: Current Outpatient Medications Medication Instructions . acetaminophen (TYLENOL) 1,000 mg, Oral, Every 6 hours PRN . amiodarone (PACERONE) 200 mg, Oral, Daily . aspirin EC 81 mg, Oral, Daily . [START ON 07/20/2020] calcitRIOL (ROCALTROL) 0.5 mcg, Oral, Every M-W-F . cinacalcet (SENSIPAR) 30 mg, Oral, Daily with breakfast . citalopram (CELEXA) 10 MG tablet TAKE ONE (1) TABLET BY MOUTH EVERY DAY . doxazosin (CARDURA) 8 mg, Oral, Daily . hydrALAZINE (APRESOLINE) 75 mg, Oral, 3 times daily . isosorbide mononitrate (IMDUR) 30 mg, Oral, Daily . magnesium oxide (MAG-OX) 200 mg, Oral, Daily . pantoprazole (PROTONIX) 40 MG tablet TAKE ONE TABLET BY MOUTH TWICE A DAY . potassium chloride 20 MEQ TBCR 10 mEq, Oral, Daily . sildenafil (VIAGRA) 50-100 mg, Oral, Daily PRN . sitaGLIPtin (JANUVIA) 25 mg, Oral, Daily . sodium bicarbonate 1,950 mg, Oral, Daily . torsemide (DEMADEX) 60 mg, Oral, Daily ANALYSIS: A 16 channel recording using standard 10 20 measurements is conducted for 25 minutes.  There is a well-formed posterior dominant rhythm of 8.5 to 9 Hz which attenuates with eye opening.  There is beta activity observed in frontal areas.  Mostly awake architecture is noted with some brief drowsiness at times.  Photic stimulation and hyperventilation were not carried out.  Focal or lateral slowing are not observed.  No epileptiform discharges were noted. IMPRESSION: This is a normal recording of awake and drowsy states. Kofi A. Merlene Laughter, M.D. Diplomate, Tax adviser of Psychiatry and Neurology ( Neurology).   [place] UE VEIN MAPPING RIGHT (PRE-OP AVF)  Result Date: 07/26/2020 CLINICAL DATA:  74 year old male presents for vascular mapping EXAM: [place] EXTREM UP VEIN MAPPING COMPARISON:  None. FINDINGS: RIGHT ARTERIES Wrist Radial Artery: Size 39mm Waveform triphasic Wrist Ulnar Artery: Size 63mm Waveform triphasic Prox. Forearm Radial Artery: Size 74mm Waveform triphasic  Upper Arm Brachial Artery: Size 29mm Waveform triphasic RIGHT VEINS Forearm Cephalic Vein: Prox: IV present, distal 24mm Depth 29mm Upper Arm Cephalic Vein: Prox 58mm Distal 69mm Depth 71mm Upper Arm Basilic Vein: Prox 72mm Distal 39mm Depth 50mm Upper Arm Brachial Vein: Prox 8mm Distal 49mm Depth 71mm ADDITIONAL RIGHT VEINS Axillary Vein:  8mm Subclavian Vein: Patient: Yes respiratory Phasicity: Yes Internal Jugular Vein: Patent: Yes respiratory Phasicity: Yes LEFT ARTERIES Wrist Radial Artery: Size 81mm Waveform triphasic Wrist Ulnar Artery: Size 58mm Waveform triphasic Prox. Forearm Radial Artery: Size 42mm Waveform triphasic Upper Arm Brachial Artery: Size 49mm Waveform triphasic LEFT VEINS Forearm Cephalic Vein: Prox 91mm Distal 63mm Depth 62mm Upper Arm Cephalic Vein: Prox 53mm Distal 11mm Depth 13mm Upper Arm Basilic Vein: Prox 66mm Distal 76mm Depth 67mm Upper Arm Brachial Vein: Prox 65mm Distal 73mm Depth 21mm ADDITIONAL LEFT VEINS Axillary Vein:  30mm Subclavian Vein: Patient: Yes respiratory Phasicity: Yes Internal Jugular Vein: Patent: Yes respiratory Phasicity: Yes IMPRESSION: Bilateral upper extremity vascular mapping, as above. Signed, Dulcy Fanny. Dellia Nims, RPVI [facility] Radiology Electronically Signed   By: Corrie Mckusick D.O.   On: 07/26/2020 13:33   DG C-Arm 1-60 Min-No  Report  Result Date: 07/25/2020 Fluoroscopy was utilized by the requesting physician.  No radiographic interpretation.   ECHOCARDIOGRAM COMPLETE  Result Date: 07/11/2020    ECHOCARDIOGRAM REPORT   Patient Name:   Bradley Black Date of Exam: 07/09/2020 Medical Rec #:  [id number]     Height:       68.0 in Accession #:    [id number]    Weight:       160.0 lb Date of Birth:  1946/06/01     BSA:          1.859 m Patient Age:    27 years      BP:           172/96 mmHg Patient Gender: M             HR:           61 bpm. Exam Location:  [facility] Procedure: 2D Echo Indications:    CHF-Acute Systolic  History:         Patient has prior history of Echocardiogram examinations, most                 recent 10/27/2018. CAD, Prior CABG; Risk Factors:Hypertension,                 Diabetes and Dyslipidemia.  Sonographer:    Mikki Santee RDCS (AE) Referring Phys: [place]  1. Compared to previous echo, LVEF and RVEF are worse . Left ventricular ejection fraction, by estimation, is 25%%. The left ventricle has severely decreased function. The left ventricle demonstrates global hypokinesis. The left ventricular internal cavity size was moderately dilated. Left ventricular diastolic parameters are consistent with Grade III diastolic dysfunction (restrictive). Elevated left atrial pressure.  2. Right ventricular systolic function is severely reduced. The right ventricular size is mildly enlarged. There is moderately elevated pulmonary artery systolic pressure.  3. Left atrial size was severely dilated.  4. Right atrial size was moderately dilated.  5. The mitral valve is normal in structure. Mild to moderate mitral valve regurgitation.  6. The aortic valve is tricuspid. Aortic valve regurgitation is trivial.  7. The inferior vena cava is dilated in size with <50% respiratory variability, suggesting right atrial pressure of 15 mmHg. FINDINGS  Left Ventricle: Compared to previous echo, LVEF and RVEF are worse. Left ventricular ejection fraction, by estimation, is 25%%. The left ventricle has severely decreased function. The left ventricle demonstrates global hypokinesis. The left ventricular internal cavity size was moderately dilated. There is no left ventricular hypertrophy. Left ventricular diastolic parameters are consistent with Grade III diastolic dysfunction (restrictive). Elevated left atrial pressure. Right Ventricle: The right ventricular size is mildly enlarged. No increase in right ventricular wall thickness. Right ventricular systolic function is severely reduced. There is moderately elevated  pulmonary artery systolic pressure. The tricuspid regurgitant velocity is 3.01 m/s, and with an assumed right atrial pressure of 15 mmHg, the estimated right ventricular systolic pressure is 59.1 mmHg. Left Atrium: Left atrial size was severely dilated. Right Atrium: Right atrial size was moderately dilated. Pericardium: There is no evidence of pericardial effusion. Mitral Valve: The mitral valve is normal in structure. Mild to moderate mitral valve regurgitation. Tricuspid Valve: The tricuspid valve is normal in structure. Tricuspid valve regurgitation is mild. Aortic Valve: The aortic valve is tricuspid. Aortic valve regurgitation is trivial. Pulmonic Valve: The pulmonic valve was not well visualized. Pulmonic valve regurgitation is not visualized. No evidence of pulmonic stenosis. Aorta: The aortic root  is normal in size and structure. Venous: The inferior vena cava is dilated in size with less than 50% respiratory variability, suggesting right atrial pressure of 15 mmHg. IAS/Shunts: The interatrial septum was not assessed.  LEFT VENTRICLE PLAX 2D LVIDd:         5.69 cm  Diastology LVIDs:         5.12 cm  LV e' medial:    3.00 cm/s LV PW:         1.03 cm  LV E/e' medial:  25.1 LV IVS:        1.02 cm  LV e' lateral:   5.35 cm/s LVOT diam:     2.10 cm  LV E/e' lateral: 14.1 LVOT Area:     3.46 cm  RIGHT VENTRICLE RV S prime:     4.50 cm/s TAPSE (M-mode): 0.6 cm LEFT ATRIUM            Index       RIGHT ATRIUM           Index LA diam:      5.10 cm  2.74 cm/m  RA Area:     23.10 cm LA Vol (A4C): 105.0 ml 56.49 ml/m RA Volume:   69.30 ml  37.28 ml/m   AORTA Ao Root diam: 3.40 cm MITRAL VALVE               TRICUSPID VALVE MV Area (PHT): 3.48 cm    TR Peak grad:   36.2 mmHg MV Decel Time: 218 msec    TR Vmax:        301.00 cm/s MR Peak grad: 95.6 mmHg MR Mean grad: 61.0 mmHg    SHUNTS MR Vmax:      489.00 cm/s  Systemic Diam: 2.10 cm MR Vmean:     370.0 cm/s MV E velocity: 75.30 cm/s MV A velocity: 45.90 cm/s MV  E/A ratio:  1.64 Dorris Carnes MD Electronically signed by Dorris Carnes MD Signature Date/Time: 07/11/2020/11:23:09 AM    Final    US Abdomen Limited RUQ  Result Date: 07/09/2020 CLINICAL DATA:  Elevated liver function tests. EXAM: ULTRASOUND ABDOMEN LIMITED RIGHT UPPER QUADRANT COMPARISON:  None. FINDINGS: Gallbladder: No gallstones or wall thickening visualized. No sonographic Murphy sign noted by sonographer. Common bile duct: Diameter: Normal, 6 mm. Liver: No focal lesion identified. Within normal limits in parenchymal echogenicity. Portal vein is patent on color Doppler imaging with normal direction of blood flow towards the liver. Other: Perihepatic small volume ascites. Right-sided pleural effusion. Increased echogenicity within the right kidney incidentally noted. IMPRESSION: No acute process or explanation for elevated liver function tests. Small volume ascites and right pleural effusion, suggesting fluid overload. Increased renal echogenicity, likely related to medical renal disease. Electronically Signed   By: Abigail Miyamoto M.D.   On: 07/09/2020 11:44      Subjective: No issues  Discharge Exam: Vitals:   08/02/20 0602 08/02/20 1326  BP: (!) 159/54 (!) 136/50  Pulse: 65 60  Resp: 18 20  Temp: 98.2 F (36.8 C) (!) 97.5 F (36.4 C)  SpO2: 97% 100%   Vitals:   08/01/20 1431 08/01/20 2105 08/02/20 0602 08/02/20 1326  BP: (!) 123/42 129/68 (!) 159/54 (!) 136/50  Pulse:  80 65 60  Resp: 18  18 20   Temp: 98 F (36.7 C) 97.8 F (36.6 C) 98.2 F (36.8 C) (!) 97.5 F (36.4 C)  TempSrc: Oral Oral Oral Oral  SpO2: 100% 90% 97% 100%  Weight:  Height:        General exam: Appears calm and comfortable Respiratory system: Clear to auscultation. Respiratory effort normal. Cardiovascular system: S1 & S2 heard, RRR. No murmurs, rubs, gallops or clicks. Left forearm AVF with bruit and palpable thrill Gastrointestinal system: Abdomen is nondistended, soft and nontender. No organomegaly  or masses felt. Normal bowel sounds heard. Central nervous system: Alert and oriented. No focal neurological deficits. Musculoskeletal: No edema. No calf tenderness Skin: No cyanosis. No rashes Psychiatry: Judgement and insight appear normal. Mood & affect appropriate.    The results of significant diagnostics from this hospitalization (including imaging, microbiology, ancillary and laboratory) are listed below for reference.     Microbiology: Recent Results (from the past 240 hour(s))  Surgical pcr screen     Status: None   Collection Time: 07/25/20  6:30 AM   Specimen: Nasal Mucosa; Nasal Swab  Result Value Ref Range Status   MRSA, PCR NEGATIVE NEGATIVE Final   Staphylococcus aureus NEGATIVE NEGATIVE Final    Comment: (NOTE) The Xpert SA Assay (FDA approved for NASAL specimens in patients 42 years of age and older), is one component of a comprehensive surveillance program. It is not intended to diagnose infection nor to guide or monitor treatment. Performed at [facility], [address] [place], [place] [address]      Labs: BNP (last 3 results) Recent Labs    07/08/20 1329  BNP >4,401.0*   Basic Metabolic Panel: Recent Labs  Lab 07/28/20 1703 07/30/20 1238  NA 133* 132*  K 4.2 3.8  CL 98 97*  CO2 24 24  GLUCOSE 125* 194*  BUN 77* 76*  CREATININE 4.54* 4.91*  CALCIUM 9.0 9.1  PHOS 4.9* 5.6*   Liver Function Tests: Recent Labs  Lab 07/28/20 1703 07/30/20 1238  ALBUMIN 3.4* 3.4*   No results for input(s): LIPASE, AMYLASE in the last 168 hours. No results for input(s): AMMONIA in the last 168 hours. CBC: Recent Labs  Lab 07/28/20 1703 07/30/20 1238  WBC 8.9 9.0  HGB 11.3* 11.0*  HCT 34.3* 33.2*  MCV 96.9 96.5  PLT 128* 134*   Cardiac Enzymes: No results for input(s): CKTOTAL, CKMB, CKMBINDEX, TROPONINI in the last 168 hours. BNP: Invalid input(s): POCBNP CBG: Recent Labs  Lab 07/30/20 1131 07/30/20 1635 07/31/20 0734 07/31/20 1140  07/31/20 1629  GLUCAP 149* 105* 90 127* 147*   D-Dimer No results for input(s): DDIMER in the last 72 hours. Hgb A1c No results for input(s): HGBA1C in the last 72 hours. Lipid Profile No results for input(s): CHOL, HDL, LDLCALC, TRIG, CHOLHDL, LDLDIRECT in the last 72 hours. Thyroid function studies No results for input(s): TSH, T4TOTAL, T3FREE, THYROIDAB in the last 72 hours.  Invalid input(s): FREET3 Anemia work up No results for input(s): VITAMINB12, FOLATE, FERRITIN, TIBC, IRON, RETICCTPCT in the last 72 hours. Urinalysis    Component Value Date/Time   COLORURINE YELLOW 07/09/2020 1655   APPEARANCEUR CLEAR 07/09/2020 1655   LABSPEC 1.008 07/09/2020 1655   PHURINE 5.0 07/09/2020 1655   GLUCOSEU NEGATIVE 07/09/2020 1655   HGBUR NEGATIVE 07/09/2020 [place] 07/09/2020 1655   [place] 07/09/2020 1655   PROTEINUR 30 (A) 07/09/2020 1655   UROBILINOGEN 1.0 12/24/2011 1220   NITRITE NEGATIVE 07/09/2020 1655   LEUKOCYTESUR NEGATIVE 07/09/2020 1655   Sepsis Labs Invalid input(s): PROCALCITONIN,  WBC,  LACTICIDVEN Microbiology Recent Results (from the past 240 hour(s))  Surgical pcr screen     Status: None   Collection Time:  07/25/20  6:30 AM   Specimen: Nasal Mucosa; Nasal Swab  Result Value Ref Range Status   MRSA, PCR NEGATIVE NEGATIVE Final   Staphylococcus aureus NEGATIVE NEGATIVE Final    Comment: (NOTE) The Xpert SA Assay (FDA approved for NASAL specimens in patients 87 years of age and older), is one component of a comprehensive surveillance program. It is not intended to diagnose infection nor to guide or monitor treatment. Performed at [facility], [address], [place], [place] [address]      Time coordinating discharge: 35 minutes  SIGNED:   Cordelia Poche, MD [facility] 08/02/2020, 3:30 PM

## 2020-08-02 NOTE — Telephone Encounter (Signed)
Scheduled and letter sent

## 2020-08-02 NOTE — Discharge Instructions (Signed)
Bradley Black,  You were in the hospital because of jerking that was related to kidney failure. You are now requiring hemodialysis. Please follow-up with your hemodialysis center and your nephrologist.

## 2020-08-02 NOTE — Telephone Encounter (Signed)
Noted  

## 2020-08-02 NOTE — TOC Transition Note (Signed)
Transition of Care [facility]) - CM/SW Discharge Note  Patient Details  Name: Bradley Black MRN: [id number] Date of Birth: July 30, 1946  Transition of Care [facility]) CM/SW Contact:  Sherie Don, LCSW Phone Number: 08/02/2020, 3:23 PM  Clinical Narrative: [facility] requested that insurance information be re-faxed. Facesheet and demographics were re-faxed to [facility]. CSW updated by dialysis RN that patient will go to [facility] dialysis at [address] [place], [place] [address] starting Thursday August 04, 2020 at 10:30am. Patient will need to arrive by 10:00am to sign paperwork. Per Georgina Snell with [facility], patient will need HHPT resumption orders for [facility]. Orders have been placed. CSW updated wife. TOC signing off.  Final next level of care: [place] Barriers to Discharge: Barriers Resolved  Patient Goals and CMS Choice Patient states their goals for this hospitalization and ongoing recovery are:: Discharge home and start outpatient dialysis CMS Medicare.gov Compare Post Acute Care list provided to:: Patient Represenative (must comment) Choice offered to / list presented to : Spouse  Discharge Plan and Services In-house Referral: Clinical Social Work Discharge Planning Services: NA Post Acute Care Choice: Home Health          DME Arranged: N/A DME Agency: NA HH Arranged: PT HH Agency: [place] Date [facility] Agency Contacted: 08/02/20 Time [place]: 40 Representative spoke with at [place]: Georgina Snell  Readmission Risk Interventions Readmission Risk Prevention Plan 07/26/2020  Transportation Screening Complete  [place] or Home Care Consult Complete  Social Work Consult for [place] Planning/Counseling Complete  Medication Review Press photographer) Complete  Some recent data might be hidden

## 2020-08-04 ENCOUNTER — Other Ambulatory Visit: Payer: Self-pay | Admitting: *Deleted

## 2020-08-04 DIAGNOSIS — N186 End stage renal disease: Secondary | ICD-10-CM | POA: Diagnosis not present

## 2020-08-04 DIAGNOSIS — Z992 Dependence on renal dialysis: Secondary | ICD-10-CM | POA: Diagnosis not present

## 2020-08-04 NOTE — Patient Outreach (Signed)
[place] [facility]) Care Management  08/04/2020  Bradley Black 06-27-1946 [id number]   [facility] multidisciplinary care discussion  [facility] RN CM reviewed Bradley Black during [facility] multidisciplinary care discussion Suggestions: inquire about/education on alternative care services (palliative)  Plan [facility] RN CM will follow up with Bradley Black for post hospital services    [place]. Lavina Hamman, RN, BSN, [place] Coordinator Office number [phone] Main [facility] number [phone] Fax number [phone]

## 2020-08-04 NOTE — Patient Outreach (Signed)
[place] [facility]) Care Management  08/04/2020  Bradley Black Mar 25, 1946 [id number]   [facility] post hospital Referral  Bradley Black was referred to [facility] on 07/14/20 by [place] hospital liaison Referral reason: Please refer to telephonic RN for post hospital follow up. Patient had been followed by [place] prior to hospital admission.    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 [facility] RN CM left HIPAA [facility] Portability and Accountability Act) compliant voicemail message along with CM's contact info.   Plan: [facility] RN CM scheduled this [facility] engaged patient for another call attempt within 4-7 business days  Nylene Inlow L. Lavina Hamman, RN, BSN, [place] Coordinator Office number [phone] Main [facility] number [phone] Fax number [phone]

## 2020-08-05 ENCOUNTER — Telehealth: Payer: Self-pay

## 2020-08-05 ENCOUNTER — Other Ambulatory Visit: Payer: Self-pay | Admitting: *Deleted

## 2020-08-05 DIAGNOSIS — I5042 Chronic combined systolic (congestive) and diastolic (congestive) heart failure: Secondary | ICD-10-CM | POA: Diagnosis not present

## 2020-08-05 DIAGNOSIS — N186 End stage renal disease: Secondary | ICD-10-CM | POA: Diagnosis not present

## 2020-08-05 DIAGNOSIS — I132 Hypertensive heart and chronic kidney disease with heart failure and with stage 5 chronic kidney disease, or end stage renal disease: Secondary | ICD-10-CM | POA: Diagnosis not present

## 2020-08-05 DIAGNOSIS — E1122 Type 2 diabetes mellitus with diabetic chronic kidney disease: Secondary | ICD-10-CM | POA: Diagnosis not present

## 2020-08-05 DIAGNOSIS — I0981 Rheumatic heart failure: Secondary | ICD-10-CM | POA: Diagnosis not present

## 2020-08-05 NOTE — Patient Outreach (Signed)
[facility] [facility]) Care Management  08/05/2020  Bradley Black [birth year] [id number]   THNpost hospitalReferral 2nd unsuccessful outreach  Bradley Black [facility] on[facility]/14/21[facility] hospital liaison Referral reason:Please refer to telephonic RN for post hospital follow up. Patient had been followed by [place] prior to hospital admission.    Admission 07/20/20- 08/02/20 tremors of nervous system, heart failure 07/08/20 -07/13/20 acute on chronic combinedcongestive Heart Failure (CHF)in exacerbation  Insurance:[employer] [facility]) medicare  Unsuccessful outreach  No answer. [facility] RN CM left HIPAA [facility] Portability and Accountability Act) compliant voicemail message along with CMs contact info.   Plan: [facility] RN CM scheduled this THN engaged patient for another call attempt within 4-7 business days Sent a [facility] unsuccessful outreach letter  [place]. Lavina Hamman, RN, BSN, [place] Coordinator Office number [phone] Main [facility] number [phone] Fax number [phone]

## 2020-08-05 NOTE — Telephone Encounter (Signed)
Tressia Danas Physical Therapist from [place] called with orders  1 wk 1 2 wk 2 1 wk 1 Safety Environment, Gait.   Interaction w/ medication, Imdur & Viagra would you like him to continue both?

## 2020-08-05 NOTE — Telephone Encounter (Signed)
DC viagra Cannot take imdur with viagra.  OK with home health

## 2020-08-06 DIAGNOSIS — Z992 Dependence on renal dialysis: Secondary | ICD-10-CM | POA: Diagnosis not present

## 2020-08-06 DIAGNOSIS — N186 End stage renal disease: Secondary | ICD-10-CM | POA: Diagnosis not present

## 2020-08-08 DIAGNOSIS — E1122 Type 2 diabetes mellitus with diabetic chronic kidney disease: Secondary | ICD-10-CM | POA: Diagnosis not present

## 2020-08-08 DIAGNOSIS — I5042 Chronic combined systolic (congestive) and diastolic (congestive) heart failure: Secondary | ICD-10-CM | POA: Diagnosis not present

## 2020-08-08 DIAGNOSIS — I132 Hypertensive heart and chronic kidney disease with heart failure and with stage 5 chronic kidney disease, or end stage renal disease: Secondary | ICD-10-CM | POA: Diagnosis not present

## 2020-08-08 DIAGNOSIS — N186 End stage renal disease: Secondary | ICD-10-CM | POA: Diagnosis not present

## 2020-08-08 DIAGNOSIS — I0981 Rheumatic heart failure: Secondary | ICD-10-CM | POA: Diagnosis not present

## 2020-08-08 NOTE — Telephone Encounter (Signed)
I have called pt back and spoke to his wife [name] and she stated that he is currently not taking the Viagra. I stated understanding.

## 2020-08-09 ENCOUNTER — Ambulatory Visit: Payer: Medicare Other | Admitting: Family Medicine

## 2020-08-09 DIAGNOSIS — E119 Type 2 diabetes mellitus without complications: Secondary | ICD-10-CM | POA: Diagnosis not present

## 2020-08-09 DIAGNOSIS — N186 End stage renal disease: Secondary | ICD-10-CM | POA: Diagnosis not present

## 2020-08-09 DIAGNOSIS — Z992 Dependence on renal dialysis: Secondary | ICD-10-CM | POA: Diagnosis not present

## 2020-08-10 DIAGNOSIS — I132 Hypertensive heart and chronic kidney disease with heart failure and with stage 5 chronic kidney disease, or end stage renal disease: Secondary | ICD-10-CM | POA: Diagnosis not present

## 2020-08-10 DIAGNOSIS — N186 End stage renal disease: Secondary | ICD-10-CM | POA: Diagnosis not present

## 2020-08-10 DIAGNOSIS — I5042 Chronic combined systolic (congestive) and diastolic (congestive) heart failure: Secondary | ICD-10-CM | POA: Diagnosis not present

## 2020-08-10 DIAGNOSIS — E1122 Type 2 diabetes mellitus with diabetic chronic kidney disease: Secondary | ICD-10-CM | POA: Diagnosis not present

## 2020-08-10 DIAGNOSIS — I0981 Rheumatic heart failure: Secondary | ICD-10-CM | POA: Diagnosis not present

## 2020-08-11 ENCOUNTER — Other Ambulatory Visit: Payer: Self-pay | Admitting: *Deleted

## 2020-08-11 DIAGNOSIS — N186 End stage renal disease: Secondary | ICD-10-CM | POA: Diagnosis not present

## 2020-08-11 DIAGNOSIS — Z992 Dependence on renal dialysis: Secondary | ICD-10-CM | POA: Diagnosis not present

## 2020-08-11 NOTE — Patient Outreach (Addendum)
[facility]) Care Management  08/11/2020  Bradley Black 30-Apr-1946 [id number]   Bradley Black  Bradley Bradley Black was referred to Bradley Black [facility]14/[facility] Referral reason:Please refer to telephonic RN for post Black follow up. Patient had been followed by Bradley Black prior to Black admission.    Admissions10/20/21- 08/02/20 tremors of nervous system, heart failure d/c home with Bradley Black 07/08/20 -07/13/20 acute on chronic combinedcongestive Heart Failure (CHF)in exacerbation  Insurance:Bradley Black) medicare   Transition of care Black noted to be completed by primary care MD office staff Transition of Care will be completed by primary care provider office who will refer to [facility] care management if needed.    Post Black follow up Chronic Kidney disease (CKD) Had Hemodialysis (HD) today at the [facility] dialysis Black and reports feeling weak after dialysis Will be attending on Tuesdays, Thursdays and Saturdays He reports he is learning about his labs He has a low lab value today but was not able to recall the name of the lab. 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 Depression assessed   Patient Black Telephone from 08/11/2020 in Bradley Black  PHQ-2 Total Score 0    Bradley Black states he feels good about receiving dialysis "I really don't have a choice now"  He denies concerns and was offered time to ventilate his  feelings He reports only a little stress and fatigue (barrier) related to the new treatment   Offered Bradley Black SW/RN for ventilation/counseling Black or resources prn  He voices appreciation of the offer but thinks he is okay at this time  [facility] RN CM sent EMMI education materials via mail on renal function panel, palliative care, advance directives, dialysis and diet and dialysis diet  congestive Heart Failure (CHF) Discussed ankle pumps to assist with home management of foot edema  Reviewed CHF signs and symptoms (s/s) and action plan Discussed the importance of weighing daily  Hypertension (HTN) BP lower now that he is receiving HD He reports he has not had to take his HTN med for several days  Appointments Bradley Black 08/12/20 Bradley Black, cardiology 08/15/20 Bradley Black, vascular surgeon 11/129/21 Bradley Black cardiology 09/02/20  Plan [facility] RN CM will follow up with Bradley Black within the next 14-21 business days Pt encouraged to return a call to [facility] RN CM prn Routed note to MD  Goals      Patient Stated   .  [facility]) Follow My Treatment Plan (pt-stated)      Follow Up Date 08/23/20   - ask for help if I can't afford my medicines - call the doctor or nurse to get help with side effects - keep follow-up appointments   Notes:    .  [facility]) Manage My Diet (pt-stated)      Follow Up Date 08/23/20   - choose foods low in fat and sugar - choose foods that are low in sodium (salt) - watch for swelling in feet, ankles and legs every day - weigh myself daily     Notes:    .  ([facility]) Monitor and Manage My Blood Sugar (  pt-stated)      Follow Up Date 08/23/20 - check blood sugar at prescribed times - check blood sugar if I feel it is too high or too low    Notes:  08/11/20 Reports CBG values within normal limits since started HD   .  [facility] [place]) Track and Manage Fluids and Swelling (pt-stated)      Follow Up Date 07/22/20  - call office if I gain more than 2 pounds in one day or 5 pounds in one week -  watch for swelling in feet, ankles and legs every day - weigh myself daily    Notes:    .  [facility] [place]) Track and Manage My Blood Pressure (pt-stated)      Follow Up Date 08/11/20   - check blood pressure weekly - choose a place to take my blood pressure (home, clinic or office, retail store)     Notes: 08/11/20 Reports BP values within normal limits since started HD and is being taken at HD   .  [facility]) Track and Manage My Symptoms (pt-stated)      Follow Up Date 08/23/20    - balance periods of rest with activity as needed - keep all lab appointments - take medicine as prescribed - watch for Bradley Black signs of feeling worse     Notes:        Madiline Saffran L. Lavina Hamman, RN, BSN, [place] Coordinator Office number [phone] Main [facility] number [phone] Fax number [phone]

## 2020-08-13 DIAGNOSIS — N186 End stage renal disease: Secondary | ICD-10-CM | POA: Diagnosis not present

## 2020-08-13 DIAGNOSIS — Z992 Dependence on renal dialysis: Secondary | ICD-10-CM | POA: Diagnosis not present

## 2020-08-14 NOTE — Progress Notes (Signed)
Cardiology Clinic Note   Patient Name: Bradley Black Date of Encounter: 08/15/2020  Primary Care Provider:  Susy Frizzle, MD Primary Cardiologist:  No primary care provider on file.  Patient Profile    Bradley Black 74 year old male presents the clinic today for a follow-up evaluation of his chronic systolic CHF, prolonged QTC, and primary hypertension.  Past Medical History    Past Medical History:  Diagnosis Date  . BPH (benign prostatic hyperplasia)   . CAD S/P percutaneous coronary angioplasty    a. PTCA of Bradley Black b. PCI with BMS to LAD in 1997 c. RCA PCI [place] d. s/p CABG in 11/2011 with LIMA-LAD, SVG-PDA, SVG-OM2, and SVG-D1  . Chronic back pain   . CKD (chronic kidney disease), stage III ([place])   . Cyst of bursa    R shoulder  . Diabetic retinopathy ([place])   . DM (diabetes mellitus), type 2 with renal complications ([place])   . Essential hypertension   . Frequent PVCs   . GERD (gastroesophageal reflux disease)   . Ischemic cardiomyopathy 11/2011   Intra-OP TEE: EF 40-45%, no regional WMA; improved Anterior WM post CABG.  . Left carotid artery stenosis   . Mixed hyperlipidemia   . 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 AV FISTULA PLACEMENT Left 07/28/2020   Procedure: LEFT ARM ARTERIOVENOUS (AV) FISTULA  CREATION;  Surgeon: Bradley Posner, MD;  Location: [facility];  Service: Vascular;  Laterality: Left;  . BACK SURGERY  1970  . CARDIAC CATHETERIZATION  2013  . CORONARY ANGIOPLASTY  1994   OM  . CORONARY ANGIOPLASTY  1997   LAD  . CORONARY ANGIOPLASTY WITH STENT PLACEMENT  1999   RCA  . CORONARY ANGIOPLASTY WITH STENT PLACEMENT  2001   RCA  . CORONARY ARTERY BYPASS GRAFT  12/27/2011   Procedure: CORONARY ARTERY BYPASS GRAFTING (CABG);  Surgeon: Bradley Alberts, MD;  Location: [facility];  Service: Open Heart Surgery;  Laterality:  N/A;  Times four. On pump. Using endoscopically harvested right greater saphenous vein and left internal mammary artery.   [name] HERNIA REPAIR    . INCISION AND DRAINAGE OF WOUND  2006   [place]  . INSERTION OF DIALYSIS CATHETER Right 07/25/2020   Procedure: INSERTION OF DIALYSIS CATHETER;  Surgeon: Virl Cagey, MD;  Location: [facility];  Service: General;  Laterality: Right;  . INTRAOPERATIVE TRANSESOPHAGEAL ECHOCARDIOGRAM  12/27/2011   Global hypokinesis with EF of 40-45%, improved LAD distribution wall motion.  [name] LEFT HEART CATHETERIZATION WITH CORONARY ANGIOGRAM N/A 12/13/2011   Procedure: LEFT HEART CATHETERIZATION WITH CORONARY ANGIOGRAM;  Surgeon: Bradley Man, MD;  Location: [facility] CATH LAB;  Service: Cardiovascular;  Laterality: N/A;  . LUMBAR FUSION    . MASS EXCISION  10/24/11   R arm  . RIGHT HEART CATH N/A 07/12/2020   Procedure: RIGHT HEART CATH;  Surgeon: Bradley Crome, MD;  Location: [facility] LAB;  Service: Cardiovascular;  Laterality: N/A;  . TRANSESOPHAGEAL ECHOCARDIOGRAM  2013    Allergies  No Known Allergies  History of Present Illness    Mr. Bradley Black has a PMH of ischemic cardiomyopathy, systolic and diastolic CHF, HTN, diabetes, and CKD stage V.  He presented to the hospital and was admitted from 07/20/2020 until 08/02/2020.  He  presented to the emergency department due to recurrent and ongoing uncontrollable tremors.  He indicated that the tremors have been ongoing since 07/09/2020.  He has been referred to the emergency department by cardiology on 07/17/2020 due to concern for worsening renal function.  Patient was discharged home and instructed to contact nephrology.  Patient's jerking/myoclonic jerks became worse.  Patient was wife contacted nephrology office who spoke with wife via Bradley Black.  It was recommended that he be taken to the ED via EMS for further evaluation.  He was seen and evaluated by neurologist who diagnosed him with continues mild clonus and  negative myoclonus which is suspected to be related to toxic metabolic derangement secondary to medication effect of his SSRI and renal failure.  His EEG showed normal recordings with him being awake and drowsy states.  MRI of brain also showed no acute abnormalities.  He presents to the clinic today for follow-up evaluation states his weight has remained stable since being discharged from the hospital.  His weight today is 141 pounds.  He is going to dialysis Tuesday Thursday and Saturday.  His blood pressures have been well controlled at home 100s over 70s.  He states that he has had some constipation and dark stools.  We discussed increasing the fiber in his diet, maintaining his fluid intake, and increasing his physical activity.  His blood pressure is slightly elevated today.  He has been slowly increasing his physical activity and denies abdominal edema.  His fistula in his left forearm/wrist is healing well.  His temporary dialysis site in his right subclavian has been working well, dressing clean dry intact.  I will give him the salty 6 diet sheet, have him wear himself daily and follow-up in 3 months.  Today he denies chest pain, shortness of breath, lower extremity edema, fatigue, palpitations, melena, hematuria, hemoptysis, diaphoresis, weakness, presyncope, syncope, orthopnea, and PND.   Home Medications    Prior to Admission medications   Medication Sig Start Date End Date Taking? Authorizing Provider  acetaminophen (TYLENOL) 500 MG tablet Take 1,000 mg by mouth every 6 (six) hours as needed for moderate pain or headache.    [provider]  amiodarone (PACERONE) 200 MG tablet Take 1 tablet (200 mg total) by mouth daily. 07/18/20   Bradley Black, Bradley Rinks, MD  aspirin EC 81 MG tablet Take 81 mg by mouth daily.    [provider]  calcitRIOL (ROCALTROL) 0.5 MCG capsule Take 1 capsule (0.5 mcg total) by mouth every Monday, Wednesday, and Friday. 07/20/20   Bradley Dubois, MD    cinacalcet (SENSIPAR) 30 MG tablet Take 30 mg by mouth daily with breakfast.  03/23/19   [provider]  doxazosin (CARDURA) 8 MG tablet Take 1 tablet (8 mg total) by mouth daily. 02/17/20   Bradley Frizzle, MD  hydrALAZINE (APRESOLINE) 50 MG tablet Take 1 tablet (50 mg total) by mouth 3 (three) times daily. 08/02/20   Mariel Aloe, MD  isosorbide mononitrate (IMDUR) 30 MG 24 hr tablet Take 1 tablet (30 mg total) by mouth daily. 07/14/20   Swayze, Ava, DO  pantoprazole (PROTONIX) 40 MG tablet TAKE ONE TABLET BY MOUTH TWICE A DAY Patient taking differently: Take 40 mg by mouth 2 (two) times daily.  09/07/19   Bradley Frizzle, MD  sevelamer carbonate (RENVELA) 800 MG tablet Take 1 tablet (800 mg total) by mouth 3 (three) times daily with meals. 08/02/20 10/31/20  Mariel Aloe, MD  sildenafil (VIAGRA) 100 MG tablet Take  0.5-1 tablets (50-100 mg total) by mouth daily as needed for erectile dysfunction. 05/09/20   Bradley Frizzle, MD  sitaGLIPtin (JANUVIA) 50 MG tablet Take 25 mg by mouth daily.    [provider]    Family History    Family History  Problem Relation Age of Onset  . Cancer Mother        breast  . Cancer Father        bone  . Bradan cancer Neg Hx    He indicated that his mother is deceased. He indicated that his father is deceased. He indicated that the status of his neg hx is unknown.  Social History    Social History   Socioeconomic History  . Marital status: Married    Spouse name: [name]  . Number of children: 1  . Years of education: college  . Highest education level: Bachelor's degree (e.g., BA, AB, BS)  Occupational History  . Not on file  Tobacco Use  . Smoking status: Former Smoker    Types: Cigarettes    Quit date: 10/01/1986    Years since quitting: 33.8  . Smokeless tobacco: Never Used  . Tobacco comment: quit about 30 yrs ago  Vaping Use  . Vaping Use: Never used  Substance and Sexual Activity  . Alcohol use: No  . Drug  use: No  . Sexual activity: Not on file  Other Topics Concern  . Not on file  Social History Narrative   Married,  father of one,  grandfather 68. Works out at [employer] at [employer] roughly 2-3 days a week. He works on a treadmill. He does note having a hard time getting his heart rate up.   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   He quit smoking in 1988, and does not drink alcohol.   Social Determinants of Health   Financial Resource Strain:   . Difficulty of Paying Living Expenses: Not on file  Food Insecurity: No Food Insecurity  . Worried About Charity fundraiser in the Last Year: Never true  . Ran Out of Food in the Last Year: Never true  Transportation Needs: No Transportation Needs  . Lack of Transportation (Medical): No  . Lack of Transportation (Non-Medical): No  Physical Activity:   . Days of Exercise per Week: Not on file  . Minutes of Exercise per Session: Not on file  Stress: No Stress Concern Present  . Feeling of Stress : Only a little  Social Connections:   . Frequency of Communication with Friends and Family: Not on file  . Frequency of Social Gatherings with Friends and Family: Not on file  . Attends Religious Services: Not on file  . Active Member of Clubs or Organizations: Not on file  . Attends Archivist Meetings: Not on file  . Marital Status: Not on file  Intimate Partner Violence: Not At Risk  . Fear of Current or Ex-Partner: No  . Emotionally Abused: No  . Physically Abused: No  . Sexually Abused: No     Review of Systems    General:  No chills, fever, night sweats or weight changes.  Cardiovascular:  No chest pain, dyspnea on exertion, edema, orthopnea, palpitations, paroxysmal nocturnal dyspnea. Dermatological: No rash, lesions/masses Respiratory: No cough, dyspnea Urologic: No hematuria, dysuria Abdominal:   No nausea, vomiting, diarrhea, bright red blood per rectum, melena, or hematemesis Neurologic:  No visual changes, wkns,  changes in mental status. All other systems reviewed  and are otherwise negative except as noted above.  Physical Exam    VS:  BP (!) 150/70   Pulse 64   Ht 5\' 8"  (1.727 m)   Wt 141 lb (64 kg)   SpO2 98%   BMI 21.44 kg/m  , BMI Body mass index is 21.44 kg/m. GEN: Well nourished, well developed, in no acute distress. HEENT: normal. Neck: Supple, no JVD, carotid bruits, or masses. Cardiac: RRR, no murmurs, rubs, or gallops. No clubbing, cyanosis, edema.  Radials/DP/PT 2+ and equal bilaterally.  Respiratory:  Respirations regular and unlabored, clear to auscultation bilaterally. GI: Soft, nontender, nondistended, BS + x 4. MS: no deformity or atrophy. Skin: warm and dry, no rash.  Left wrist fistula site clean dry intact no drainage.  Right tunneled dialysis catheter dressing clean dry intact no drainage Neuro:  Strength and sensation are intact. Psych: Normal affect.  Accessory Clinical Findings    Recent Labs: 07/08/2020: B Natriuretic Peptide >4,500.0 07/21/2020: ALT 61; Magnesium 2.5 08/02/2020: BUN 80; Creatinine, Ser 4.62; Hemoglobin 11.0; Platelets 148; Potassium 4.3; Sodium 132   Recent Lipid Panel    Component Value Date/Time   CHOL 104 07/13/2020 0118   TRIG 62 07/13/2020 0118   HDL 25 (L) 07/13/2020 0118   CHOLHDL 4.2 07/13/2020 0118   VLDL 12 07/13/2020 0118   LDLCALC 67 07/13/2020 0118   LDLCALC 73 05/01/2019 0945    ECG personally reviewed by me today-none today.  Echocardiogram 07/09/2020 IMPRESSIONS    1. Compared to previous echo, LVEF and RVEF are worse . Left ventricular  ejection fraction, by estimation, is 25%%. The left ventricle has severely  decreased function. The left ventricle demonstrates global hypokinesis.  The left ventricular internal  cavity size was moderately dilated. Left ventricular diastolic parameters  are consistent with Grade III diastolic dysfunction (restrictive).  Elevated left atrial pressure.  2. Right ventricular  systolic function is severely reduced. The right  ventricular size is mildly enlarged. There is moderately elevated  pulmonary artery systolic pressure.  3. Left atrial size was severely dilated.  4. Right atrial size was moderately dilated.  5. The mitral valve is normal in structure. Mild to moderate mitral valve  regurgitation.  6. The aortic valve is tricuspid. Aortic valve regurgitation is trivial.  7. The inferior vena cava is dilated in size with <50% respiratory  variability, suggesting right atrial pressure of 15 mmHg.   Cardiac catheterization 07/12/2020   Mildly elevated mean pulmonary artery pressure at 21 mmHg.  This is consistent with mild pulmonary hypertension.  Pulmonary capillary wedge pressure mean is 11 mmHg.  Cardiac output 4.6 L/min.  Main pulmonary artery O2 saturation 64%.  Arterial pulse oximetry O2 saturation 99%. Pulmonary vascular resistance 2.16 [place] units.  Right Heart Pressures    Hemodynamic findings consistent with mild pulmonary hypertension.  Right Atrium   The right atrial size is normal.  Right Ventricle   The systolic function is normal.     Assessment & Plan   1.  Chronic systolic and diastolic CHF-euvolemic today.  Echocardiogram 07/09/2020 showed LVEF 25%, global HK, G3 DD, left atrium severely dilated, right atrium is moderately dilated, and no significant valvular abnormalities. Continue amiodarone, aspirin, doxazosin, Imdur Heart healthy low-sodium diet-salty 6 given Increase physical activity as tolerated Daily weights-contact office with weight gain, 3 pounds overnight or 5 pounds in 1 week Elevate lower extremities when not active  PVC burden-heart rate today 64 bpm.  Less fatigue and better activity tolerance with addition of  amiodarone. Continue amiodarone Increase physical activity as tolerated  Coronary artery disease-no chest pain today.  Underwent PTCA 1988 in 1994, PCI with BMS to LAD in 1997, RCA PCI [facility] in 2000,  status post CABG 3/13 LIMA-LAD, SVG-PDA, SVG-OM1, and SVG-D1 Continue aspirin, Imdur Heart healthy low-sodium diet-salty 6 given Increase physical activity as tolerated  Essential hypertension-BP today 150/70.  Well-controlled at home.  He brings blood pressures showing 100s over 70s. Continue Advair, doxazosin, sildenafil Heart healthy low-sodium diet-salty 6 given Increase physical activity as tolerated  Myoclonic jerks/generalized weakness-much improved.  Continues with generalized weakness.  Improved with HD. Follows with neurologist/nephrology  Disposition: Follow-up with cardiology in 3 months.  Jossie Ng. Taelon Bendorf NP-C    08/15/2020, 2:51 PM [facility] [address] Office [phone] Fax [phone]  Notice: This dictation was prepared with Dragon dictation along with smaller phrase technology. Any transcriptional errors that result from this process are unintentional and may not be corrected upon review.

## 2020-08-15 ENCOUNTER — Other Ambulatory Visit: Payer: Self-pay

## 2020-08-15 ENCOUNTER — Ambulatory Visit: Payer: Medicare Other | Admitting: General Practice

## 2020-08-15 ENCOUNTER — Encounter: Payer: Self-pay | Admitting: General Practice

## 2020-08-15 VITALS — BP 150/70 | HR 64 | Ht 68.0 in | Wt 141.0 lb

## 2020-08-15 DIAGNOSIS — I5042 Chronic combined systolic (congestive) and diastolic (congestive) heart failure: Secondary | ICD-10-CM | POA: Diagnosis not present

## 2020-08-15 DIAGNOSIS — I10 Essential (primary) hypertension: Secondary | ICD-10-CM

## 2020-08-15 DIAGNOSIS — I5043 Acute on chronic combined systolic (congestive) and diastolic (congestive) heart failure: Secondary | ICD-10-CM | POA: Diagnosis not present

## 2020-08-15 DIAGNOSIS — I132 Hypertensive heart and chronic kidney disease with heart failure and with stage 5 chronic kidney disease, or end stage renal disease: Secondary | ICD-10-CM | POA: Diagnosis not present

## 2020-08-15 DIAGNOSIS — I0981 Rheumatic heart failure: Secondary | ICD-10-CM | POA: Diagnosis not present

## 2020-08-15 DIAGNOSIS — G253 Myoclonus: Secondary | ICD-10-CM | POA: Diagnosis not present

## 2020-08-15 DIAGNOSIS — I493 Ventricular premature depolarization: Secondary | ICD-10-CM | POA: Diagnosis not present

## 2020-08-15 DIAGNOSIS — I25810 Atherosclerosis of coronary artery bypass graft(s) without angina pectoris: Secondary | ICD-10-CM

## 2020-08-15 DIAGNOSIS — N186 End stage renal disease: Secondary | ICD-10-CM | POA: Diagnosis not present

## 2020-08-15 DIAGNOSIS — E1122 Type 2 diabetes mellitus with diabetic chronic kidney disease: Secondary | ICD-10-CM | POA: Diagnosis not present

## 2020-08-15 NOTE — Patient Instructions (Addendum)
Medication Instructions:  Your physician recommends that you continue on your current medications as directed. Please refer to the Current Medication list given to you today.  *If you need a refill on your cardiac medications before your next appointment, please call your pharmacy*   Lab Work: None today If you have labs (blood work) drawn today and your tests are completely normal, you will receive your results only by: [name] MyChart Message (if you have MyChart) OR . A paper copy in the mail If you have any lab test that is abnormal or we need to change your treatment, we will call you to review the results.   Testing/Procedures: None today   Follow-Up: At [facility], you and your health needs are our priority.  As part of our continuing mission to provide you with exceptional heart care, we have created designated Provider Care Teams.  These Care Teams include your primary Cardiologist (physician) and Advanced Practice Providers (APPs -  Physician Assistants and Nurse Practitioners) who all work together to provide you with the care you need, when you need it.  We recommend signing up for the patient portal called "MyChart".  Sign up information is provided on this After Visit Summary.  MyChart is used to connect with patients for Virtual Visits (Telemedicine).  Patients are able to view lab/test results, encounter notes, upcoming appointments, etc.  Non-urgent messages can be sent to your provider as well.   To learn more about what you can do with MyChart, go to [email or web address].    Your next appointment:   3 month(s)  The format for your next appointment:   In Person  Provider:   You will see one of the following Advanced Practice Providers on your designated Care Team:    [place], PA-C   Ermalinda Barrios, PA-C       Other Instructions Continue to monitor your stools   Weight yourself daily, I have provided you a form to record weights    Follow the  [place] guidelines I have provided for you           Thank you for choosing [facility] !

## 2020-08-16 DIAGNOSIS — Z992 Dependence on renal dialysis: Secondary | ICD-10-CM | POA: Diagnosis not present

## 2020-08-16 DIAGNOSIS — N186 End stage renal disease: Secondary | ICD-10-CM | POA: Diagnosis not present

## 2020-08-17 DIAGNOSIS — I132 Hypertensive heart and chronic kidney disease with heart failure and with stage 5 chronic kidney disease, or end stage renal disease: Secondary | ICD-10-CM | POA: Diagnosis not present

## 2020-08-17 DIAGNOSIS — E1122 Type 2 diabetes mellitus with diabetic chronic kidney disease: Secondary | ICD-10-CM | POA: Diagnosis not present

## 2020-08-17 DIAGNOSIS — N186 End stage renal disease: Secondary | ICD-10-CM | POA: Diagnosis not present

## 2020-08-17 DIAGNOSIS — I0981 Rheumatic heart failure: Secondary | ICD-10-CM | POA: Diagnosis not present

## 2020-08-17 DIAGNOSIS — I5042 Chronic combined systolic (congestive) and diastolic (congestive) heart failure: Secondary | ICD-10-CM | POA: Diagnosis not present

## 2020-08-18 DIAGNOSIS — N186 End stage renal disease: Secondary | ICD-10-CM | POA: Diagnosis not present

## 2020-08-18 DIAGNOSIS — Z992 Dependence on renal dialysis: Secondary | ICD-10-CM | POA: Diagnosis not present

## 2020-08-20 DIAGNOSIS — Z992 Dependence on renal dialysis: Secondary | ICD-10-CM | POA: Diagnosis not present

## 2020-08-20 DIAGNOSIS — N186 End stage renal disease: Secondary | ICD-10-CM | POA: Diagnosis not present

## 2020-08-23 ENCOUNTER — Other Ambulatory Visit: Payer: Self-pay | Admitting: *Deleted

## 2020-08-23 DIAGNOSIS — N186 End stage renal disease: Secondary | ICD-10-CM | POA: Diagnosis not present

## 2020-08-23 DIAGNOSIS — Z992 Dependence on renal dialysis: Secondary | ICD-10-CM | POA: Diagnosis not present

## 2020-08-23 NOTE — Patient Outreach (Signed)
[facility]) Care Management  08/23/2020  Armanii Corney Knighton 1946/09/28 [id number]   THNunsuccessful post hospitalReferraloutreach  Mr Taiten L Mckenzie was referred to [facility] on10/14/21by[facility] hospital liaison Referral reason:Please refer to telephonic RN for post hospital follow up. Patient had been followed by [facility] prior to hospital admission.    Admissions10/20/21- 08/02/20 tremors of nervous system, heart failure d/c home with [facility] 07/08/20 -07/13/20 acute on chronic combinedcongestive Heart Failure (CHF)in exacerbation  Insurance:United Health care [facility]) medicare  Transition of care services noted to be completed by primary care MD office staff Transition of Care will be completed by primary care provider office who will refer to [facility] care management if needed.  THN Unsuccessful outreach   Outreach attempt to the home number  No answer. THN RN CM left HIPAA [facility] Portability and Accountability Act) compliant voicemail message along with CM's contact info.   Plan: [facility] RN CM scheduled this patient for another call attempt within 4-7 business days  Sybella Harnish L. Lavina Hamman, RN, BSN, [place] Coordinator Office number [phone] Mobile number [phone]  Main THN number [phone] Fax number [phone]

## 2020-08-24 DIAGNOSIS — I132 Hypertensive heart and chronic kidney disease with heart failure and with stage 5 chronic kidney disease, or end stage renal disease: Secondary | ICD-10-CM | POA: Diagnosis not present

## 2020-08-24 DIAGNOSIS — I0981 Rheumatic heart failure: Secondary | ICD-10-CM | POA: Diagnosis not present

## 2020-08-24 DIAGNOSIS — E1122 Type 2 diabetes mellitus with diabetic chronic kidney disease: Secondary | ICD-10-CM | POA: Diagnosis not present

## 2020-08-24 DIAGNOSIS — N186 End stage renal disease: Secondary | ICD-10-CM | POA: Diagnosis not present

## 2020-08-24 DIAGNOSIS — I5042 Chronic combined systolic (congestive) and diastolic (congestive) heart failure: Secondary | ICD-10-CM | POA: Diagnosis not present

## 2020-08-25 DIAGNOSIS — N186 End stage renal disease: Secondary | ICD-10-CM | POA: Diagnosis not present

## 2020-08-25 DIAGNOSIS — Z992 Dependence on renal dialysis: Secondary | ICD-10-CM | POA: Diagnosis not present

## 2020-08-27 DIAGNOSIS — N186 End stage renal disease: Secondary | ICD-10-CM | POA: Diagnosis not present

## 2020-08-27 DIAGNOSIS — Z992 Dependence on renal dialysis: Secondary | ICD-10-CM | POA: Diagnosis not present

## 2020-08-29 ENCOUNTER — Telehealth: Payer: Self-pay | Admitting: Family Medicine

## 2020-08-29 ENCOUNTER — Encounter: Payer: Self-pay | Admitting: Vascular Surgery

## 2020-08-29 ENCOUNTER — Ambulatory Visit (INDEPENDENT_AMBULATORY_CARE_PROVIDER_SITE_OTHER): Payer: Self-pay | Admitting: Vascular Surgery

## 2020-08-29 ENCOUNTER — Other Ambulatory Visit: Payer: Self-pay

## 2020-08-29 VITALS — BP 117/68 | HR 49 | Temp 98.2°F | Resp 16 | Ht 68.0 in | Wt 142.0 lb

## 2020-08-29 DIAGNOSIS — N186 End stage renal disease: Secondary | ICD-10-CM

## 2020-08-29 DIAGNOSIS — Z992 Dependence on renal dialysis: Secondary | ICD-10-CM

## 2020-08-29 NOTE — Telephone Encounter (Signed)
Pt need a referral to see a Neurologist

## 2020-08-29 NOTE — Progress Notes (Signed)
   Vascular and Vein Specialist of [place]  Patient name: Bradley Black MRN: [id number] DOB: 27-Aug-1946 Sex: male  REASON FOR VISIT: Follow-up left AV fistula  HPI: Bradley Black is a 74 y.o. male today for follow-up of left radiocephalic fistula creation on 07/28/2020.  Surgery was as an inpatient at [facility].  He is doing well.  He is using his tunneled hemodialysis catheter.  He reports being tired around the time of dialysis.  He has had no difficulty with his fistula  Current Outpatient Medications  Medication Sig Dispense Refill  . acetaminophen (TYLENOL) 500 MG tablet Take 1,000 mg by mouth every 6 (six) hours as needed for moderate pain or headache.    [name] amiodarone (PACERONE) 200 MG tablet Take 1 tablet (200 mg total) by mouth daily. 90 tablet 3  . aspirin EC 81 MG tablet Take 81 mg by mouth daily.    . calcitRIOL (ROCALTROL) 0.5 MCG capsule Take 1 capsule (0.5 mcg total) by mouth every Monday, Wednesday, and Friday. 45 capsule 1  . cinacalcet (SENSIPAR) 30 MG tablet Take 30 mg by mouth daily with breakfast.     . doxazosin (CARDURA) 8 MG tablet Take 1 tablet (8 mg total) by mouth daily. 90 tablet 3  . hydrALAZINE (APRESOLINE) 50 MG tablet Take 1 tablet (50 mg total) by mouth 3 (three) times daily. 90 tablet 0  . isosorbide mononitrate (IMDUR) 30 MG 24 hr tablet Take 1 tablet (30 mg total) by mouth daily. 30 tablet 0  . pantoprazole (PROTONIX) 40 MG tablet TAKE ONE TABLET BY MOUTH TWICE A DAY (Patient taking differently: Take 40 mg by mouth 2 (two) times daily. ) 180 tablet 3  . sevelamer carbonate (RENVELA) 800 MG tablet Take 1 tablet (800 mg total) by mouth 3 (three) times daily with meals. 90 tablet 2  . sitaGLIPtin (JANUVIA) 50 MG tablet Take 25 mg by mouth daily.     No current facility-administered medications for this visit.     PHYSICAL EXAM: Vitals:   08/29/20 0935  BP: 117/68  Pulse: (!) 49  Resp: 16  Temp: 98.2 F  (36.8 C)  TempSrc: Other (Comment)  SpO2: 99%  Weight: 142 lb (64.4 kg)  Height: 5\' 8"  (1.727 m)    GENERAL: The patient is a well-nourished male, in no acute distress. The vital signs are documented above. Well-healed incision in the left arm.  Excellent thrill with good Anica Alcaraz maturation of his cephalic vein throughout his forearm.  This runs quite superficially and is very straight.  MEDICAL ISSUES: I explained that he will need a total of 3 months of maturation before using his fistula.  I think he has a very high likelihood that this will be successful.  I did explain the potential of nonmaturation and he may require additional fistula attempts.  He will continue his hemodialysis and should be able to use his fistula at the end of January 2022   Rosetta Posner, MD [facility] V[facility] and Vein Specialists of [facility] Tel [phone]

## 2020-08-30 ENCOUNTER — Other Ambulatory Visit: Payer: Self-pay | Admitting: Family Medicine

## 2020-08-30 ENCOUNTER — Other Ambulatory Visit: Payer: Self-pay | Admitting: *Deleted

## 2020-08-30 DIAGNOSIS — Z992 Dependence on renal dialysis: Secondary | ICD-10-CM | POA: Diagnosis not present

## 2020-08-30 DIAGNOSIS — N186 End stage renal disease: Secondary | ICD-10-CM | POA: Diagnosis not present

## 2020-08-30 NOTE — Patient Outreach (Signed)
Bradley Black) Care Management  08/30/2020  Bradley Black 26-Nov-1945 [id number]  [facility] post hospitalReferraloutreach Bradley Black was referred to [facility] on10/14/21byB [facility] hospital liaison Referral reason:Please refer to telephonic RN for post hospital follow up. Patient had been followed by [facility] prior to hospital admission.    Admissions10/20/21- 08/02/20 tremors of nervous system, heart failured/c home with [facility] 07/08/20 -07/13/20 acute on chronic combinedcongestive Heart Failure (CHF)in exacerbation  Insurance:United Health care [facility]) medicare  Transition of care services noted to be completed by primary care MD office staff Transition of Care will be completed by primary care provider office who will refer to [facility] care management if needed.  Outreach attempt to the home number  Wife Bradley Black answered. THN RN CM left HIPAA [facility] Portability and Accountability Act) compliant voicemail message along with CM's contact info.  Mrs Iyengar reports Bradley Corrie is not coping as well as he states related to going to dialysis [place] RN CM discussed resources and discuss these will be re discussed with Bradley Spivack   Plan: [facility] RN CM scheduled this patient for another call attempt within 4-7 business days  [place] L. Lavina Hamman, RN, BSN, [place] Coordinator Office number [phone] Mobile number [phone]  Main THN number [phone] Fax number [phone]

## 2020-08-31 DIAGNOSIS — I132 Hypertensive heart and chronic kidney disease with heart failure and with stage 5 chronic kidney disease, or end stage renal disease: Secondary | ICD-10-CM | POA: Diagnosis not present

## 2020-08-31 DIAGNOSIS — N186 End stage renal disease: Secondary | ICD-10-CM | POA: Diagnosis not present

## 2020-08-31 DIAGNOSIS — I5042 Chronic combined systolic (congestive) and diastolic (congestive) heart failure: Secondary | ICD-10-CM | POA: Diagnosis not present

## 2020-08-31 DIAGNOSIS — E1122 Type 2 diabetes mellitus with diabetic chronic kidney disease: Secondary | ICD-10-CM | POA: Diagnosis not present

## 2020-08-31 DIAGNOSIS — I0981 Rheumatic heart failure: Secondary | ICD-10-CM | POA: Diagnosis not present

## 2020-09-01 DIAGNOSIS — N186 End stage renal disease: Secondary | ICD-10-CM | POA: Diagnosis not present

## 2020-09-01 DIAGNOSIS — Z23 Encounter for immunization: Secondary | ICD-10-CM | POA: Diagnosis not present

## 2020-09-01 DIAGNOSIS — Z992 Dependence on renal dialysis: Secondary | ICD-10-CM | POA: Diagnosis not present

## 2020-09-02 ENCOUNTER — Ambulatory Visit: Payer: Medicare Other | Admitting: Internal Medicine

## 2020-09-02 NOTE — Telephone Encounter (Signed)
Calling back about his referral to see a Neurologist

## 2020-09-03 DIAGNOSIS — N186 End stage renal disease: Secondary | ICD-10-CM | POA: Diagnosis not present

## 2020-09-03 DIAGNOSIS — Z23 Encounter for immunization: Secondary | ICD-10-CM | POA: Diagnosis not present

## 2020-09-03 DIAGNOSIS — Z992 Dependence on renal dialysis: Secondary | ICD-10-CM | POA: Diagnosis not present

## 2020-09-05 ENCOUNTER — Other Ambulatory Visit: Payer: Self-pay | Admitting: *Deleted

## 2020-09-05 ENCOUNTER — Telehealth: Payer: Self-pay | Admitting: *Deleted

## 2020-09-05 ENCOUNTER — Other Ambulatory Visit: Payer: Self-pay

## 2020-09-05 ENCOUNTER — Encounter: Payer: Self-pay | Admitting: *Deleted

## 2020-09-05 NOTE — Telephone Encounter (Signed)
Pt already made aware

## 2020-09-05 NOTE — Telephone Encounter (Signed)
-----   Message from Verta Ellen., NP sent at 08/31/2020  6:39 PM EST ----- The patient has been seen by Dr Rayann Heman since the monitor was performed. There is no need to call the patient with results. I am sure he is already aware of the results. His medication was changed by Dr Rayann Heman from Cardizem to Metoprolol. Thank You

## 2020-09-05 NOTE — Patient Outreach (Signed)
[facility]) Care Management  09/05/2020  Bradley Black [birth year] [id number]   THNpost hospitalReferraloutreach Bradley Black was referred to [facility] on10/14/21by[facility] hospital liaison Referral reason:Please refer to telephonic RN for post hospital follow up. Patient had been followed by [place] prior to hospital admission.    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 Patient gave verbal consent for services.   Follow up assessment Hemodialysis (HD) treatments/coping  Bradley Black reports his feelings about HD "Giving me a rough time" He report when leaving at times he feels dizzy and "stumbling around"  [facility] RN CM discussed the importance of not feeling this way to prevent falls He and [name] RN CM discussed hypotension, HD removal of fluids, sodium and potassium that may lead to hypotension, cramps etc He voiced understanding  He reports being aware that hs BP drops low when it is taken at HD [name] RN CM inquired about Hypertension (HTN) medications and reviewed the medication list with him  His medication list indicates hydralazine, imdur and cardura He states he was taken off all his Hypertension (HTN) medications and was told to only take his cardura (doxazosin) on days he does not go to HD  [name] RN CM discussed that Cardura is for Benign prostatic hyperplasia (BPH) and can treat HTN.   Pt reports he recalling only seeing Bradley Black Saw MD neph once [facility] RN CM outreached to [name] and spoke with Bradley Black's HD RN, Bradley Black She confirms the Nephrologist is Bradley Black She discussed Bradley Black reporting an episode of dizziness and tremors She discussed his 09/03/20 BP values of 138/53 sitting and 100/51 66 standing. Bradley Black is not reported to be on a fluid restrictions. Bradley Black reports generally 2.2 kilo is generally removed in HD  [name] RN CM also discussed Bradley Black is wanting to speak with someone about coping with HD or maybe a support group. Bradley Black will mention this to the HD SW  Coping Bradley Black reports he is to follow up with Bradley Black on Thursday 09/08/20 and wanted to speak with him about someone to speak with about coping with HD He is also on celexa  [facility] RN CM will follow up with Bradley Black within the next 30 business days  Bradley Halls L. Lavina Hamman, RN, BSN, [place] Coordinator Office number [phone] Main [facility] number [phone] Fax number [phone]

## 2020-09-06 DIAGNOSIS — N186 End stage renal disease: Secondary | ICD-10-CM | POA: Diagnosis not present

## 2020-09-06 DIAGNOSIS — Z23 Encounter for immunization: Secondary | ICD-10-CM | POA: Diagnosis not present

## 2020-09-06 DIAGNOSIS — Z992 Dependence on renal dialysis: Secondary | ICD-10-CM | POA: Diagnosis not present

## 2020-09-07 NOTE — Telephone Encounter (Signed)
Ok for referral?

## 2020-09-08 ENCOUNTER — Other Ambulatory Visit: Payer: Self-pay | Admitting: Family Medicine

## 2020-09-08 ENCOUNTER — Ambulatory Visit (INDEPENDENT_AMBULATORY_CARE_PROVIDER_SITE_OTHER): Payer: Medicare Other | Admitting: Family Medicine

## 2020-09-08 ENCOUNTER — Other Ambulatory Visit: Payer: Self-pay

## 2020-09-08 VITALS — BP 120/68 | HR 64 | Temp 97.1°F | Ht 68.0 in

## 2020-09-08 DIAGNOSIS — G4700 Insomnia, unspecified: Secondary | ICD-10-CM | POA: Diagnosis not present

## 2020-09-08 DIAGNOSIS — F339 Major depressive disorder, recurrent, unspecified: Secondary | ICD-10-CM | POA: Diagnosis not present

## 2020-09-08 DIAGNOSIS — Z23 Encounter for immunization: Secondary | ICD-10-CM | POA: Diagnosis not present

## 2020-09-08 DIAGNOSIS — Z992 Dependence on renal dialysis: Secondary | ICD-10-CM | POA: Diagnosis not present

## 2020-09-08 DIAGNOSIS — G253 Myoclonus: Secondary | ICD-10-CM

## 2020-09-08 DIAGNOSIS — N186 End stage renal disease: Secondary | ICD-10-CM | POA: Diagnosis not present

## 2020-09-08 MED ORDER — SERTRALINE HCL 50 MG PO TABS: 50.0000 mg | ORAL_TABLET | Freq: Every day | ORAL | 3 refills | Status: DC

## 2020-09-08 MED ORDER — ALPRAZOLAM 0.5 MG PO TABS: 0.5000 mg | ORAL_TABLET | Freq: Every evening | ORAL | 0 refills | Status: DC | PRN

## 2020-09-08 NOTE — Progress Notes (Signed)
Subjective:    Patient ID: Bradley Black, male    DOB: 1945/10/28, 73 y.o.   MRN: [id number]  HPI Since I last saw the patient, he developed acute renal failure and ultimately had to undergo dialysis.  He has been hospitalized twice and in the last hospitalization developed myoclonic jerking of his extremities.  It was felt possibly due to Celexa so he discontinued the Celexa although he continues to have the myoclonus.  Neurology consultation in the hospital revealed a normal EEG.  He has an outpatient appointment with neurology pending however due to the recent hospitalizations, he reports feeling depressed.  He reports anhedonia.  He reports anxiety and trouble sleeping. Past Medical History:  Diagnosis Date  . BPH (benign prostatic hyperplasia)   . CAD S/P percutaneous coronary angioplasty    a. PTCA of [place] b. PCI with BMS to LAD in 1997 c. RCA PCI [place] d. s/p CABG in 11/2011 with LIMA-LAD, SVG-PDA, SVG-OM2, and SVG-D1  . Chronic back pain   . CKD (chronic kidney disease), stage III ([place])   . Cyst of bursa    R shoulder  . Diabetic retinopathy ([place])   . DM (diabetes mellitus), type 2 with renal complications ([place])   . Essential hypertension   . Frequent PVCs   . GERD (gastroesophageal reflux disease)   . Ischemic cardiomyopathy 11/2011   Intra-OP TEE: EF 40-45%, no regional WMA; improved Anterior WM post CABG.  . Left carotid artery stenosis   . Mixed hyperlipidemia   . 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 AV FISTULA PLACEMENT Left 07/28/2020   Procedure: LEFT ARM ARTERIOVENOUS (AV) FISTULA  CREATION;  Surgeon: Rosetta Posner, MD;  [facility];  Service: Vascular;  Laterality: Left;  . BACK SURGERY  1970  . CARDIAC CATHETERIZATION  2013  . CORONARY ANGIOPLASTY  1994   OM  . CORONARY ANGIOPLASTY  1997   LAD  . CORONARY ANGIOPLASTY  WITH STENT PLACEMENT  1999   RCA  . CORONARY ANGIOPLASTY WITH STENT PLACEMENT  2001   RCA  . CORONARY ARTERY BYPASS GRAFT  12/27/2011   Procedure: CORONARY ARTERY BYPASS GRAFTING (CABG);  Surgeon: Rexene Alberts, MD;  Location: [place];  Service: Open Heart Surgery;  Laterality: N/A;  Times four. On pump. Using endoscopically harvested right greater saphenous vein and left internal mammary artery.   [name] HERNIA REPAIR    . INCISION AND DRAINAGE OF WOUND  2006   axilla  . INSERTION OF DIALYSIS CATHETER Right 07/25/2020   Procedure: INSERTION OF DIALYSIS CATHETER;  Surgeon: Virl Cagey, MD;  Location: [facility];  Service: General;  Laterality: Right;  . INTRAOPERATIVE TRANSESOPHAGEAL ECHOCARDIOGRAM  12/27/2011   Global hypokinesis with EF of 40-45%, improved LAD distribution wall motion.  [name] LEFT HEART CATHETERIZATION WITH CORONARY ANGIOGRAM N/A 12/13/2011   Procedure: LEFT HEART CATHETERIZATION WITH CORONARY ANGIOGRAM;  Surgeon: Leonie Man, MD;  [facility] LAB;  Service: Cardiovascular;  Laterality: N/A;  . LUMBAR FUSION    . MASS EXCISION  10/24/11   R arm  . RIGHT HEART CATH N/A 07/12/2020   Procedure: RIGHT HEART CATH;  Surgeon: Belva Crome, MD;  Location: [place] CV LAB;  Service: Cardiovascular;  Laterality: N/A;  . TRANSESOPHAGEAL ECHOCARDIOGRAM  2013  Current Outpatient Medications on File Prior to Visit  Medication Sig Dispense Refill  . acetaminophen (TYLENOL) 500 MG tablet Take 1,000 mg by mouth every 6 (six) hours as needed for moderate pain or headache.    [name] amiodarone (PACERONE) 200 MG tablet Take 1 tablet (200 mg total) by mouth daily. 90 tablet 3  . aspirin EC 81 MG tablet Take 81 mg by mouth daily.    . calcitRIOL (ROCALTROL) 0.5 MCG capsule Take 1 capsule (0.5 mcg total) by mouth every Monday, Wednesday, and Friday. 45 capsule 1  . cinacalcet (SENSIPAR) 30 MG tablet Take 30 mg by mouth daily with breakfast.     . doxazosin (CARDURA) 8 MG tablet Take 1  tablet (8 mg total) by mouth daily. 90 tablet 3  . pantoprazole (PROTONIX) 40 MG tablet TAKE ONE TABLET BY MOUTH TWICE A DAY 180 tablet 3  . sevelamer carbonate (RENVELA) 800 MG tablet Take 1 tablet (800 mg total) by mouth 3 (three) times daily with meals. 90 tablet 2  . sitaGLIPtin (JANUVIA) 50 MG tablet Take 25 mg by mouth daily.    . hydrALAZINE (APRESOLINE) 50 MG tablet Take 1 tablet (50 mg total) by mouth 3 (three) times daily. (Patient not taking: No sig reported) 90 tablet 0  . isosorbide mononitrate (IMDUR) 30 MG 24 hr tablet Take 1 tablet (30 mg total) by mouth daily. (Patient not taking: No sig reported) 30 tablet 0  . magnesium oxide (MAG-OX) 400 MG tablet Take 0.5 tablets by mouth daily. (Patient not taking: Reported on 09/08/2020)     No current facility-administered medications on file prior to visit.   Allergies  Allergen Reactions  . No Known Allergies    Social History   Socioeconomic History  . Marital status: Married    Spouse name: [name]  . Number of children: 1  . Years of education: college  . Highest education level: Bachelor's degree (e.g., BA, AB, BS)  Occupational History  . 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  Tobacco Use  . Smoking status: Former Smoker    Types: Cigarettes    Quit date: 10/01/1986    Years since quitting: 33.9  . Smokeless tobacco: Never Used  . Tobacco comment: quit about 30 yrs ago  Vaping Use  . Vaping Use: Never used  Substance and Sexual Activity  . Alcohol use: No  . Drug use: No  . Sexual activity: Not on file  Other Topics Concern  . Not on file  Social History Narrative   Married,  father of one,  grandfather 32. Works out at [employer] at [employer] roughly 2-3 days a week. He works on a treadmill. He does note having a hard time getting his heart rate up.   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   He quit smoking in 1988, and does not drink alcohol.   Social Determinants of Health   Financial  Resource Strain: Not on file  Food Insecurity: No Food Insecurity  . Worried About Charity fundraiser in the Last Year: Never true  . Ran Out of Food in the Last Year: Never true  Transportation Needs: No Transportation Needs  . Lack of Transportation (Medical): No  . Lack of Transportation (Non-Medical): No  Physical Activity: Not on file  Stress: No Stress Concern Present  . Feeling of Stress : Only a little  Social Connections: Not on file  Intimate Partner Violence: Not At Risk  . Fear of  Current or Ex-Partner: No  . Emotionally Abused: No  . Physically Abused: No  . Sexually Abused: No      Review of Systems  Psychiatric/Behavioral: Positive for depression.  All other systems reviewed and are negative.      Objective:   Physical Exam Vitals reviewed.  Constitutional:      Appearance: Normal appearance.  Cardiovascular:     Rate and Rhythm: Normal rate and regular rhythm.     Heart sounds: Normal heart sounds.  Pulmonary:     Effort: Pulmonary effort is normal.     Breath sounds: Normal breath sounds.  Skin:    Findings: No erythema or rash.  Neurological:     Mental Status: He is alert.           Assessment & Plan:  Myoclonic jerking  Depression, recurrent (HCC)  Insomnia, unspecified type  We will start the patient on Zoloft 50 mg daily and then recheck in 4 weeks.  Meanwhile he can use Xanax 0.5 mg p.o. nightly as needed insomnia.  We also discussed strategies to help treat insomnia.

## 2020-09-10 DIAGNOSIS — Z23 Encounter for immunization: Secondary | ICD-10-CM | POA: Diagnosis not present

## 2020-09-10 DIAGNOSIS — Z992 Dependence on renal dialysis: Secondary | ICD-10-CM | POA: Diagnosis not present

## 2020-09-10 DIAGNOSIS — N186 End stage renal disease: Secondary | ICD-10-CM | POA: Diagnosis not present

## 2020-09-13 DIAGNOSIS — Z992 Dependence on renal dialysis: Secondary | ICD-10-CM | POA: Diagnosis not present

## 2020-09-13 DIAGNOSIS — N186 End stage renal disease: Secondary | ICD-10-CM | POA: Diagnosis not present

## 2020-09-13 DIAGNOSIS — Z23 Encounter for immunization: Secondary | ICD-10-CM | POA: Diagnosis not present

## 2020-09-15 DIAGNOSIS — Z992 Dependence on renal dialysis: Secondary | ICD-10-CM | POA: Diagnosis not present

## 2020-09-15 DIAGNOSIS — Z23 Encounter for immunization: Secondary | ICD-10-CM | POA: Diagnosis not present

## 2020-09-15 DIAGNOSIS — N186 End stage renal disease: Secondary | ICD-10-CM | POA: Diagnosis not present

## 2020-09-17 DIAGNOSIS — Z23 Encounter for immunization: Secondary | ICD-10-CM | POA: Diagnosis not present

## 2020-09-17 DIAGNOSIS — N186 End stage renal disease: Secondary | ICD-10-CM | POA: Diagnosis not present

## 2020-09-17 DIAGNOSIS — Z992 Dependence on renal dialysis: Secondary | ICD-10-CM | POA: Diagnosis not present

## 2020-09-20 DIAGNOSIS — Z992 Dependence on renal dialysis: Secondary | ICD-10-CM | POA: Diagnosis not present

## 2020-09-20 DIAGNOSIS — N186 End stage renal disease: Secondary | ICD-10-CM | POA: Diagnosis not present

## 2020-09-20 DIAGNOSIS — Z23 Encounter for immunization: Secondary | ICD-10-CM | POA: Diagnosis not present

## 2020-09-22 DIAGNOSIS — Z992 Dependence on renal dialysis: Secondary | ICD-10-CM | POA: Diagnosis not present

## 2020-09-22 DIAGNOSIS — Z23 Encounter for immunization: Secondary | ICD-10-CM | POA: Diagnosis not present

## 2020-09-22 DIAGNOSIS — N186 End stage renal disease: Secondary | ICD-10-CM | POA: Diagnosis not present

## 2020-09-25 DIAGNOSIS — Z23 Encounter for immunization: Secondary | ICD-10-CM | POA: Diagnosis not present

## 2020-09-25 DIAGNOSIS — Z992 Dependence on renal dialysis: Secondary | ICD-10-CM | POA: Diagnosis not present

## 2020-09-25 DIAGNOSIS — N186 End stage renal disease: Secondary | ICD-10-CM | POA: Diagnosis not present

## 2020-09-27 DIAGNOSIS — N186 End stage renal disease: Secondary | ICD-10-CM | POA: Diagnosis not present

## 2020-09-27 DIAGNOSIS — Z23 Encounter for immunization: Secondary | ICD-10-CM | POA: Diagnosis not present

## 2020-09-27 DIAGNOSIS — Z992 Dependence on renal dialysis: Secondary | ICD-10-CM | POA: Diagnosis not present

## 2020-09-29 DIAGNOSIS — Z992 Dependence on renal dialysis: Secondary | ICD-10-CM | POA: Diagnosis not present

## 2020-09-29 DIAGNOSIS — N186 End stage renal disease: Secondary | ICD-10-CM | POA: Diagnosis not present

## 2020-09-29 DIAGNOSIS — Z23 Encounter for immunization: Secondary | ICD-10-CM | POA: Diagnosis not present

## 2020-09-30 DIAGNOSIS — Z992 Dependence on renal dialysis: Secondary | ICD-10-CM | POA: Diagnosis not present

## 2020-09-30 DIAGNOSIS — N186 End stage renal disease: Secondary | ICD-10-CM | POA: Diagnosis not present

## 2020-10-01 DIAGNOSIS — Z23 Encounter for immunization: Secondary | ICD-10-CM | POA: Diagnosis not present

## 2020-10-01 DIAGNOSIS — Z992 Dependence on renal dialysis: Secondary | ICD-10-CM | POA: Diagnosis not present

## 2020-10-01 DIAGNOSIS — N186 End stage renal disease: Secondary | ICD-10-CM | POA: Diagnosis not present

## 2020-10-04 ENCOUNTER — Other Ambulatory Visit: Payer: Self-pay

## 2020-10-04 ENCOUNTER — Ambulatory Visit (INDEPENDENT_AMBULATORY_CARE_PROVIDER_SITE_OTHER): Payer: Medicare Other | Admitting: Family Medicine

## 2020-10-04 ENCOUNTER — Encounter: Payer: Self-pay | Admitting: Family Medicine

## 2020-10-04 VITALS — BP 164/88 | HR 80 | Temp 97.9°F | Ht 68.0 in | Wt 143.0 lb

## 2020-10-04 DIAGNOSIS — R3589 Other polyuria: Secondary | ICD-10-CM | POA: Diagnosis not present

## 2020-10-04 LAB — URINALYSIS, ROUTINE W REFLEX MICROSCOPIC
Bacteria, UA: NONE SEEN /HPF
Bilirubin Urine: NEGATIVE
Glucose, UA: NEGATIVE
Hgb urine dipstick: NEGATIVE
Ketones, ur: NEGATIVE
Leukocytes,Ua: NEGATIVE
Nitrite: NEGATIVE
RBC / HPF: NONE SEEN /HPF (ref 0–2)
Specific Gravity, Urine: 1.025 (ref 1.001–1.03)
Squamous Epithelial / HPF: NONE SEEN /HPF (ref <=5)
WBC, UA: NONE SEEN /HPF (ref 0–5)
pH: 5.5 (ref 5.0–8.0)

## 2020-10-04 LAB — MICROSCOPIC MESSAGE

## 2020-10-04 MED ORDER — SULFAMETHOXAZOLE-TRIMETHOPRIM 400-80 MG PO TABS: 1.0000 | ORAL_TABLET | Freq: Two times a day (BID) | ORAL | 0 refills | Status: DC

## 2020-10-04 NOTE — Progress Notes (Signed)
Subjective:    Patient ID: Bradley Black, male    DOB: [birth year], 75 y.o.   MRN: [id number]  Patient is a very pleasant 75 year old Caucasian male who is here today complaining of a bladder infection.  He states that over the last several days he has developed dysuria.  He states that it burns whenever he urinates.  Although he is on dialysis 3 times a week he still makes urine.  He denies any visible blood.  He also reports increased frequency.  He states that he feels like he has to urinate.  However after he urinates, he still feels like he has to urinate more but very little comes out.  He denies any fever or chills.  Prostate exam was performed today.  Prostate is swollen and firm and tender to palpation.  There is no nodularity.  When I last saw the patient I took him off doxazosin due to hypotension and the fact that he was on dialysis and if felt that he would not benefit from the symptomatic relief of his lower urinary tract symptoms from the doxazosin.  Although his blood pressure is better and is not as dizzy, this may be exacerbating some of the symptoms as well.  Urinalysis today shows protein 3+ but otherwise is completely negative.  There are no nitrites.  There is no leukocyte esterase.  There is no blood Past Medical History:  Diagnosis Date  . BPH (benign prostatic hyperplasia)   . CAD S/P percutaneous coronary angioplasty    a. PTCA of [place] b. PCI with BMS to LAD in 1997 c. RCA PCI [place] d. s/p CABG in 11/2011 with LIMA-LAD, SVG-PDA, SVG-OM2, and SVG-D1  . Chronic back pain   . CKD (chronic kidney disease), stage III ([place])   . Cyst of bursa    R shoulder  . Diabetic retinopathy ([place])   . DM (diabetes mellitus), type 2 with renal complications ([place])   . Essential hypertension   . Frequent PVCs   . GERD (gastroesophageal reflux disease)   . Ischemic cardiomyopathy 11/2011   Intra-OP TEE: EF 40-45%, no regional WMA; improved Anterior WM post CABG.  . Left  carotid artery stenosis   . Mixed hyperlipidemia   . 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 AV FISTULA PLACEMENT Left 07/28/2020   Procedure: LEFT ARM ARTERIOVENOUS (AV) FISTULA  CREATION;  Surgeon: Rosetta Posner, MD;  Location: [facility];  Service: Vascular;  Laterality: Left;  . BACK SURGERY  1970  . CARDIAC CATHETERIZATION  2013  . CORONARY ANGIOPLASTY  1994   OM  . CORONARY ANGIOPLASTY  1997   LAD  . CORONARY ANGIOPLASTY WITH STENT PLACEMENT  1999   RCA  . CORONARY ANGIOPLASTY WITH STENT PLACEMENT  2001   RCA  . CORONARY ARTERY BYPASS GRAFT  12/27/2011   Procedure: CORONARY ARTERY BYPASS GRAFTING (CABG);  Surgeon: Rexene Alberts, MD;  Location: [place];  Service: Open Heart Surgery;  Laterality: N/A;  Times four. On pump. Using endoscopically harvested right greater saphenous vein and left internal mammary artery.   [name] HERNIA REPAIR    . INCISION AND DRAINAGE OF WOUND  2006   axilla  . INSERTION OF DIALYSIS CATHETER Right 07/25/2020   Procedure: INSERTION OF DIALYSIS CATHETER;  Surgeon: Curlene Labrum  C, MD;  Location: [facility];  Service: General;  Laterality: Right;  . INTRAOPERATIVE TRANSESOPHAGEAL ECHOCARDIOGRAM  12/27/2011   Global hypokinesis with EF of 40-45%, improved LAD distribution wall motion.  [name] LEFT HEART CATHETERIZATION WITH CORONARY ANGIOGRAM N/A 12/13/2011   Procedure: LEFT HEART CATHETERIZATION WITH CORONARY ANGIOGRAM;  Surgeon: Leonie Man, MD;  [facility];  Service: Cardiovascular;  Laterality: N/A;  . LUMBAR FUSION    . MASS EXCISION  10/24/11   R arm  . RIGHT HEART CATH N/A 07/12/2020   Procedure: RIGHT HEART CATH;  Surgeon: Belva Crome, MD;  Location: [place] CV LAB;  Service: Cardiovascular;  Laterality: N/A;  . TRANSESOPHAGEAL ECHOCARDIOGRAM  2013   Current Outpatient Medications on File Prior to Visit  Medication Sig  Dispense Refill  . acetaminophen (TYLENOL) 500 MG tablet Take 1,000 mg by mouth every 6 (six) hours as needed for moderate pain or headache.    . ALPRAZolam (XANAX) 0.5 MG tablet Take 1 tablet (0.5 mg total) by mouth at bedtime as needed for sleep. 30 tablet 0  . amiodarone (PACERONE) 200 MG tablet Take 1 tablet (200 mg total) by mouth daily. 90 tablet 3  . aspirin EC 81 MG tablet Take 81 mg by mouth daily.    . calcitRIOL (ROCALTROL) 0.5 MCG capsule Take 1 capsule (0.5 mcg total) by mouth every Monday, Wednesday, and Friday. 45 capsule 1  . cinacalcet (SENSIPAR) 30 MG tablet Take 30 mg by mouth daily with breakfast.     . doxazosin (CARDURA) 8 MG tablet Take 1 tablet (8 mg total) by mouth daily. 90 tablet 3  . hydrALAZINE (APRESOLINE) 50 MG tablet Take 1 tablet (50 mg total) by mouth 3 (three) times daily. 90 tablet 0  . isosorbide mononitrate (IMDUR) 30 MG 24 hr tablet Take 1 tablet (30 mg total) by mouth daily. 30 tablet 0  . magnesium oxide (MAG-OX) 400 MG tablet Take 0.5 tablets by mouth daily.    . pantoprazole (PROTONIX) 40 MG tablet TAKE ONE TABLET BY MOUTH TWICE A DAY 180 tablet 3  . sertraline (ZOLOFT) 50 MG tablet Take 1 tablet (50 mg total) by mouth daily. 30 tablet 3  . sevelamer carbonate (RENVELA) 800 MG tablet Take 1 tablet (800 mg total) by mouth 3 (three) times daily with meals. 90 tablet 2   No current facility-administered medications on file prior to visit.   Allergies  Allergen Reactions  . No Known Allergies    Social History   Socioeconomic History  . Marital status: Married    Spouse name: [name]  . Number of children: 1  . Years of education: college  . Highest education level: Bachelor's degree (e.g., BA, AB, BS)  Occupational History  . 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  Tobacco Use  . Smoking status: Former Smoker    Types: Cigarettes    Quit date: 10/01/1986    Years since quitting: 34.0  . Smokeless tobacco: Never Used  . Tobacco  comment: quit about 30 yrs ago  Vaping Use  . Vaping Use: Never used  Substance and Sexual Activity  . Alcohol use: No  . Drug use: No  . Sexual activity: Not on file  Other Topics Concern  . Not on file  Social History Narrative   Married,  father of one,  grandfather 19. Works out at [employer] at [employer] roughly 2-3 days a week. He works on a treadmill. He does note having a  hard time getting his heart rate up.   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   He quit smoking in 1988, and does not drink alcohol.   Social Determinants of Health   Financial Resource Strain: Not on file  Food Insecurity: No Food Insecurity  . Worried About Charity fundraiser in the Last Year: Never true  . Ran Out of Food in the Last Year: Never true  Transportation Needs: No Transportation Needs  . Lack of Transportation (Medical): No  . Lack of Transportation (Non-Medical): No  Physical Activity: Not on file  Stress: No Stress Concern Present  . Feeling of Stress : Only a little  Social Connections: Not on file  Intimate Partner Violence: Not At Risk  . Fear of Current or Ex-Partner: No  . Emotionally Abused: No  . Physically Abused: No  . Sexually Abused: No      Review of Systems  Psychiatric/Behavioral: Positive for depression.  All other systems reviewed and are negative.      Objective:   Physical Exam Vitals reviewed.  Constitutional:      Appearance: Normal appearance.  Cardiovascular:     Rate and Rhythm: Normal rate and regular rhythm.     Heart sounds: Normal heart sounds.  Pulmonary:     Effort: Pulmonary effort is normal.     Breath sounds: Normal breath sounds.  Genitourinary:    Prostate: Enlarged and tender. No nodules present.  Skin:    Findings: No erythema or rash.  Neurological:     Mental Status: He is alert.           Assessment & Plan:  Polyuria - Plan: Urinalysis, Routine w reflex microscopic, Urine Culture  I believe the patient may have  prostatitis exacerbated by recently discontinuing his doxazosin.  Start Bactrim single strength tablets 1 pill twice daily for 1 week.  This is renally dosed based on the fact he is on 3 times a week hemodialysis.  Reassess in 1 week.  If symptoms persist, we may extend therapy for an additional week.  We may also want to consider an alpha-blocker such as Flomax if symptoms persist.

## 2020-10-05 ENCOUNTER — Telehealth: Payer: Self-pay | Admitting: *Deleted

## 2020-10-05 LAB — URINE CULTURE
MICRO NUMBER:: 11381152
Result:: NO GROWTH
SPECIMEN QUALITY:: ADEQUATE

## 2020-10-05 MED ORDER — SULFAMETHOXAZOLE-TRIMETHOPRIM 800-160 MG PO TABS: 0.5000 | ORAL_TABLET | Freq: Two times a day (BID) | ORAL | 0 refills | Status: AC

## 2020-10-05 NOTE — Telephone Encounter (Signed)
Received call from [facility].   Reports that Bactrim 800/40mg  is on backorder.   Requested to change prescription to Bactrim DS, give 1/2 tab PO BID x7 days.   Prescription sent to pharmacy.

## 2020-10-06 ENCOUNTER — Ambulatory Visit: Payer: Medicare Other | Admitting: Cardiology

## 2020-10-06 DIAGNOSIS — Z992 Dependence on renal dialysis: Secondary | ICD-10-CM | POA: Diagnosis not present

## 2020-10-06 DIAGNOSIS — N186 End stage renal disease: Secondary | ICD-10-CM | POA: Diagnosis not present

## 2020-10-06 DIAGNOSIS — Z23 Encounter for immunization: Secondary | ICD-10-CM | POA: Diagnosis not present

## 2020-10-08 DIAGNOSIS — N186 End stage renal disease: Secondary | ICD-10-CM | POA: Diagnosis not present

## 2020-10-08 DIAGNOSIS — Z992 Dependence on renal dialysis: Secondary | ICD-10-CM | POA: Diagnosis not present

## 2020-10-08 DIAGNOSIS — Z23 Encounter for immunization: Secondary | ICD-10-CM | POA: Diagnosis not present

## 2020-10-11 DIAGNOSIS — N186 End stage renal disease: Secondary | ICD-10-CM | POA: Diagnosis not present

## 2020-10-11 DIAGNOSIS — Z992 Dependence on renal dialysis: Secondary | ICD-10-CM | POA: Diagnosis not present

## 2020-10-11 DIAGNOSIS — N2581 Secondary hyperparathyroidism of renal origin: Secondary | ICD-10-CM | POA: Diagnosis not present

## 2020-10-11 DIAGNOSIS — Z23 Encounter for immunization: Secondary | ICD-10-CM | POA: Diagnosis not present

## 2020-10-13 ENCOUNTER — Telehealth (INDEPENDENT_AMBULATORY_CARE_PROVIDER_SITE_OTHER): Payer: Medicare Other | Admitting: Family Medicine

## 2020-10-13 ENCOUNTER — Other Ambulatory Visit: Payer: Self-pay | Admitting: *Deleted

## 2020-10-13 ENCOUNTER — Other Ambulatory Visit: Payer: Self-pay

## 2020-10-13 DIAGNOSIS — Z992 Dependence on renal dialysis: Secondary | ICD-10-CM | POA: Diagnosis not present

## 2020-10-13 DIAGNOSIS — F339 Major depressive disorder, recurrent, unspecified: Secondary | ICD-10-CM

## 2020-10-13 DIAGNOSIS — Z23 Encounter for immunization: Secondary | ICD-10-CM | POA: Diagnosis not present

## 2020-10-13 DIAGNOSIS — N410 Acute prostatitis: Secondary | ICD-10-CM | POA: Diagnosis not present

## 2020-10-13 DIAGNOSIS — N186 End stage renal disease: Secondary | ICD-10-CM | POA: Diagnosis not present

## 2020-10-13 NOTE — Progress Notes (Signed)
Subjective:    Patient ID: Bradley Black, male    DOB: [birth year], 75 y.o.   MRN: [id number]  HPI Patient is being seen today as a telephone visit.  Phone call began at 330.  Phone call concluded at 349.  Patient consents to be seen via telephone.  He is currently at home.  I am currently in my office.  Patient had been seen twice in the last month or so.  First he was diagnosed with depression and I started him on Zoloft 50 mg p.o. nightly.  He states that he is doing much better on the Zoloft.  The depression has improved dramatically.  He still has some lingering feelings of depression but they are definitely improving.  He is now sleeping much better at night.  The anhedonia is improving.  He does still occasionally have to take a Xanax to help him get to sleep especially if he is just laying in bed staring at the ceiling however he is not needing the Xanax on a daily basis.  He denies any suicidal ideation.  He denies any homicidal ideation.  He denies any anhedonia.  Second issue is his recent prostatitis.  I started the patient on Bactrim single strength tablets for prostatitis on January 4.  He states that his symptoms have completely gone away on the antibiotics.  I was questioning whether he would benefit from being back on an alpha-blocker as the symptoms seem to get worse and really exacerbated when he stopped his doxazosin.  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  However after finishing the antibiotics he states that the symptoms have totally subsided. Past Medical History:  Diagnosis Date  . BPH (benign prostatic hyperplasia)   . CAD S/P percutaneous coronary angioplasty    a. PTCA of [place] b. PCI with BMS to LAD in 1997 c. RCA PCI [place] d. s/p CABG in 11/2011 with LIMA-LAD, SVG-PDA, SVG-OM2, and SVG-D1  . Chronic back pain   . CKD (chronic kidney disease), stage III ([place])   . Cyst of bursa    R shoulder   . Diabetic retinopathy ([place])   . DM (diabetes mellitus), type 2 with renal complications ([place])   . Essential hypertension   . Frequent PVCs   . GERD (gastroesophageal reflux disease)   . Ischemic cardiomyopathy 11/2011   Intra-OP TEE: EF 40-45%, no regional WMA; improved Anterior WM post CABG.  . Left carotid artery stenosis   . Mixed hyperlipidemia   . 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 AV FISTULA PLACEMENT Left 07/28/2020   Procedure: LEFT ARM ARTERIOVENOUS (AV) FISTULA  CREATION;  Surgeon: Rosetta Posner, MD;  Location: [facility];  Service: Vascular;  Laterality: Left;  . BACK SURGERY  1970  . CARDIAC CATHETERIZATION  2013  . CORONARY ANGIOPLASTY  1994   OM  . CORONARY ANGIOPLASTY  1997   LAD  . CORONARY ANGIOPLASTY WITH STENT PLACEMENT  1999   RCA  . CORONARY ANGIOPLASTY WITH STENT PLACEMENT  2001   RCA  . CORONARY ARTERY BYPASS GRAFT  12/27/2011   Procedure: CORONARY ARTERY BYPASS GRAFTING (CABG);  Surgeon: Rexene Alberts, MD;  Location: [place];  Service: Open Heart Surgery;  Laterality: N/A;  Times  four. On pump. Using endoscopically harvested right greater saphenous vein and left internal mammary artery.   [name] HERNIA REPAIR    . INCISION AND DRAINAGE OF WOUND  2006   [place]  . INSERTION OF DIALYSIS CATHETER Right 07/25/2020   Procedure: INSERTION OF DIALYSIS CATHETER;  Surgeon: Virl Cagey, MD;  Location: [facility];  Service: General;  Laterality: Right;  . INTRAOPERATIVE TRANSESOPHAGEAL ECHOCARDIOGRAM  12/27/2011   Global hypokinesis with EF of 40-45%, improved LAD distribution wall motion.  [name] LEFT HEART CATHETERIZATION WITH CORONARY ANGIOGRAM N/A 12/13/2011   Procedure: LEFT HEART CATHETERIZATION WITH CORONARY ANGIOGRAM;  Surgeon: Leonie Man, MD;  [facility] C[facility];  Service: Cardiovascular;  Laterality: N/A;  . LUMBAR FUSION    . MASS EXCISION   10/24/11   R arm  . RIGHT HEART CATH N/A 07/12/2020   Procedure: RIGHT HEART CATH;  Surgeon: Belva Crome, MD;  Location: [place] CV LAB;  Service: Cardiovascular;  Laterality: N/A;  . TRANSESOPHAGEAL ECHOCARDIOGRAM  2013   Current Outpatient Medications on File Prior to Visit  Medication Sig Dispense Refill  . acetaminophen (TYLENOL) 500 MG tablet Take 1,000 mg by mouth every 6 (six) hours as needed for moderate pain or headache.    . ALPRAZolam (XANAX) 0.5 MG tablet Take 1 tablet (0.5 mg total) by mouth at bedtime as needed for sleep. 30 tablet 0  . amiodarone (PACERONE) 200 MG tablet Take 1 tablet (200 mg total) by mouth daily. 90 tablet 3  . aspirin EC 81 MG tablet Take 81 mg by mouth daily.    . calcitRIOL (ROCALTROL) 0.5 MCG capsule Take 1 capsule (0.5 mcg total) by mouth every Monday, Wednesday, and Friday. 45 capsule 1  . cinacalcet (SENSIPAR) 30 MG tablet Take 30 mg by mouth daily with breakfast.     . doxazosin (CARDURA) 8 MG tablet Take 1 tablet (8 mg total) by mouth daily. 90 tablet 3  . hydrALAZINE (APRESOLINE) 50 MG tablet Take 1 tablet (50 mg total) by mouth 3 (three) times daily. 90 tablet 0  . isosorbide mononitrate (IMDUR) 30 MG 24 hr tablet Take 1 tablet (30 mg total) by mouth daily. 30 tablet 0  . magnesium oxide (MAG-OX) 400 MG tablet Take 0.5 tablets by mouth daily.    . pantoprazole (PROTONIX) 40 MG tablet TAKE ONE TABLET BY MOUTH TWICE A DAY 180 tablet 3  . sertraline (ZOLOFT) 50 MG tablet Take 1 tablet (50 mg total) by mouth daily. 30 tablet 3  . sevelamer carbonate (RENVELA) 800 MG tablet Take 1 tablet (800 mg total) by mouth 3 (three) times daily with meals. 90 tablet 2   No current facility-administered medications on file prior to visit.   Allergies  Allergen Reactions  . No Known Allergies    Social History   Socioeconomic History  . Marital status: Married    Spouse name: [name]  . Number of children: 1  . Years of education: college  . Highest  education level: Bachelor's degree (e.g., BA, AB, BS)  Occupational History  . 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  Tobacco Use  . Smoking status: Former Smoker    Types: Cigarettes    Quit date: 10/01/1986    Years since quitting: 34.0  . Smokeless tobacco: Never Used  . Tobacco comment: quit about 30 yrs ago  Vaping Use  . Vaping Use: Never used  Substance and Sexual Activity  . Alcohol use: No  . Drug use: No  .  Sexual activity: Not on file  Other Topics Concern  . Not on file  Social History Narrative   Married,  father of one,  grandfather 32. Works out at [employer] at [employer] roughly 2-3 days a week. He works on a treadmill. He does note having a hard time getting his heart rate up.   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   He quit smoking in 1988, and does not drink alcohol.   Social Determinants of Health   Financial Resource Strain: Not on file  Food Insecurity: No Food Insecurity  . Worried About Charity fundraiser in the Last Year: Never true  . Ran Out of Food in the Last Year: Never true  Transportation Needs: No Transportation Needs  . Lack of Transportation (Medical): No  . Lack of Transportation (Non-Medical): No  Physical Activity: Not on file  Stress: No Stress Concern Present  . Feeling of Stress : Only a little  Social Connections: Not on file  Intimate Partner Violence: Not At Risk  . Fear of Current or Ex-Partner: No  . Emotionally Abused: No  . Physically Abused: No  . Sexually Abused: No      Review of Systems  All other systems reviewed and are negative.      Objective:   Physical Exam        Assessment & Plan:  Depression, recurrent ([place])  Acute prostatitis  I am happy to hear that the patient's depression is better.  We will continue Zoloft 50 mg p.o. nightly at the present time.  Monitor over the next 3 to 4 weeks.  If he is still dealing with depression at that point, we may want to consider increasing  Zoloft to 100 mg at night however he is satisfied with the 50 mg performance thus far.  Regarding his prostatitis, symptoms have totally resolved after the antibiotics.  Therefore we will not extend the duration of the antibiotics or add back an alpha-blocker.  Patient will notify me if he continues to have increased urinary frequency or urgency or other lower urinary tract symptoms.

## 2020-10-13 NOTE — Patient Outreach (Signed)
[facility]) Care Management  10/13/2020  Tylon Lamark Schue [birth year] [id number]   THNoutreach to complex care patient Mr Ellsworth L Hawes was referred to [facility] on10/14/21byB [facility] hospital liaison Referral reason:Please refer to telephonic RN for post hospital follow up. Patient had been followed by [facility] prior to hospital admission.    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 THN RN CM left HIPAA [facility] Portability and Accountability Act) compliant voicemail message along with CM's contact info.   Outreach attempt to the home number [phone] His wife [name] answered. THN RN CM left HIPAA [facility] Portability and Accountability Act) compliant voicemail message along with CM's contact info.  Plan: [facility] RN CM scheduled this patient for another call attempt within 4-7 business days  Elle Vezina L. Lavina Hamman, RN, BSN, [place] Coordinator Office number [phone] Mobile number [phone]  Main THN number [phone] Fax number [phone]

## 2020-10-14 ENCOUNTER — Other Ambulatory Visit: Payer: Self-pay | Admitting: *Deleted

## 2020-10-14 NOTE — Patient Outreach (Signed)
[facility]) Care Management  10/14/2020  Bradley Black 04/21/1946 [id number]   THNoutreach to complex care patient with transfer to [facility] embedded services Bradley Black was referred to [facility] on10/14/21byB [facility] liaison Referral reason:Please refer to telephonic RN for post Black follow up.  Patient had been followed by [place] prior to [facility] admission.    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 [facility] RN CM left HIPAA [facility] Portability and Accountability Act) compliant voicemail message along with CM's contact info  Successful outreach to the home number.  Patient is able to verify HIPAA ([place] and Accountability Act) identifiers Reviewed and addressed the purpose of the follow up call with the patient  Consent: [place] [facility] ([place]) RN CM reviewed [facility] services with patient. Patient gave verbal consent for services.   Reviewed outreach to the HD RN, Bradley Black on 09/05/20 Nephrologist is Dr Bradley Black Bradley Black was reported to not be on a fluid restriction. Bradley Black reported generally 2.2 kilo is generally removed in HD  [facility] RN CM also discussed Bradley Black was wanting to speak with someone about coping with HD or maybe a support group. Bradley Black stated she would mention this to the HD SW HD RN/MD/SW have not spoken with him about the outreach or coping support/resource per pt He is tolerating HD better  HD bill  Bradley Black voiced concern about not receiving a bill for HD yet-  empathy provided  Answered questions Referred to  [facility] care [facility]) Encouraged focus on healing   depression is better. continue Zoloft 50 mg p.o. nightly  Monitored by primary care provider (PCP) Has not heard from HD SW for possible counseling/support group to cope with HD Medication treatment to be re evaluate in 3 to 4 weeks by pcp. Increasing Zoloft to 100 mg at night prn noted to be an option per pcp  prostatitis symptoms resolved after the antibiotics.    [facility] RN CM reviewed with Bradley Black the transfer of his case to [facility] embedded RN CM Bradley Black. He voiced understanding and agreed to pending outreach  He denies any care coordination or disease management needs at this time  Reports his vitals are monitor between home and HD  Reports all are within normal limits   [facility] RN CM completed a warm transfer to [place] pending services and telephonic RN CM case closure Epic message to Bradley Black Patient is aware of pending outreach [facility] assigned RN CM Pt encouraged to return a call to [facility] RN CM prn Routed note to PCP Letters to pt and PCP  Goals Addressed              This Visit's Progress     Patient Stated   .  [facility]) Follow My Treatment Plan (pt-stated)   On track     Follow Up Date Pending [facility] embedded outreach   - ask for help if I can't afford my medicines - call the doctor or nurse to get help with side effects - keep follow-up appointments  Notes: 10/14/20 managing, following treatment plan, denies concerns    .  [facility]) Manage My Diet (pt-stated)   On track     Follow Up Date Pending [facility] embedded outreach   - choose foods low in fat and sugar - choose foods that are low in sodium (salt) - watch for swelling in feet, ankles and legs every day - weigh myself daily     Notes:  10/14/20 reports no concerns     .  ([facility]) Monitor and Manage My Blood Sugar (pt-stated)   On track     Follow Up Date Pending [facility] embedded outreach - check blood sugar at  prescribed times - check blood sugar if I feel it is too high or too low     Notes:  10/14/20 & 08/11/20 Reports cbg values within normal limits since started HD    .  COMPLETED: [facility]) Track and Manage Fluids and Swelling (pt-stated)   On track      - call office if I gain more than 2 pounds in one day or 5 pounds in one week - watch for swelling in feet, ankles and legs every day - weigh myself daily    Notes:  10/14/20 goal met Doing better with HD      .  COMPLETED: [facility]) Track and Manage My Blood Pressure (pt-stated)   On track        - check blood pressure weekly - choose a place to take my blood pressure (home, clinic or office, retail store)      Notes: 10/14/20 Goal completed  08/11/20 Reports BP values within normal limits since started HD and is being taken at HD    .  [facility]) Track and Manage My Symptoms (pt-stated)   On track     Follow Up Date Pending [facility] embedded outreach    - balance periods of rest with activity as needed - keep all lab appointments - take medicine as prescribed - watch for early signs of feeling worse     Notes: 10/14/20 doing well        Joelene Millin L. Lavina Hamman, RN, BSN, [place] Coordinator Office number [phone] Main [facility] number [phone] Fax number [phone]

## 2020-10-15 DIAGNOSIS — N186 End stage renal disease: Secondary | ICD-10-CM | POA: Diagnosis not present

## 2020-10-15 DIAGNOSIS — Z23 Encounter for immunization: Secondary | ICD-10-CM | POA: Diagnosis not present

## 2020-10-15 DIAGNOSIS — Z992 Dependence on renal dialysis: Secondary | ICD-10-CM | POA: Diagnosis not present

## 2020-10-17 ENCOUNTER — Ambulatory Visit: Payer: Self-pay | Admitting: *Deleted

## 2020-10-18 DIAGNOSIS — N186 End stage renal disease: Secondary | ICD-10-CM | POA: Diagnosis not present

## 2020-10-18 DIAGNOSIS — Z992 Dependence on renal dialysis: Secondary | ICD-10-CM | POA: Diagnosis not present

## 2020-10-18 DIAGNOSIS — Z23 Encounter for immunization: Secondary | ICD-10-CM | POA: Diagnosis not present

## 2020-10-20 DIAGNOSIS — N186 End stage renal disease: Secondary | ICD-10-CM | POA: Diagnosis not present

## 2020-10-20 DIAGNOSIS — Z23 Encounter for immunization: Secondary | ICD-10-CM | POA: Diagnosis not present

## 2020-10-20 DIAGNOSIS — Z992 Dependence on renal dialysis: Secondary | ICD-10-CM | POA: Diagnosis not present

## 2020-10-24 DIAGNOSIS — R2689 Other abnormalities of gait and mobility: Secondary | ICD-10-CM | POA: Diagnosis not present

## 2020-10-24 DIAGNOSIS — N189 Chronic kidney disease, unspecified: Secondary | ICD-10-CM | POA: Diagnosis not present

## 2020-10-24 DIAGNOSIS — I10 Essential (primary) hypertension: Secondary | ICD-10-CM | POA: Diagnosis not present

## 2020-10-24 DIAGNOSIS — G253 Myoclonus: Secondary | ICD-10-CM | POA: Diagnosis not present

## 2020-10-25 DIAGNOSIS — N186 End stage renal disease: Secondary | ICD-10-CM | POA: Diagnosis not present

## 2020-10-25 DIAGNOSIS — Z992 Dependence on renal dialysis: Secondary | ICD-10-CM | POA: Diagnosis not present

## 2020-10-25 DIAGNOSIS — Z23 Encounter for immunization: Secondary | ICD-10-CM | POA: Diagnosis not present

## 2020-10-27 DIAGNOSIS — N186 End stage renal disease: Secondary | ICD-10-CM | POA: Diagnosis not present

## 2020-10-27 DIAGNOSIS — Z23 Encounter for immunization: Secondary | ICD-10-CM | POA: Diagnosis not present

## 2020-10-27 DIAGNOSIS — Z992 Dependence on renal dialysis: Secondary | ICD-10-CM | POA: Diagnosis not present

## 2020-10-31 DIAGNOSIS — N186 End stage renal disease: Secondary | ICD-10-CM | POA: Diagnosis not present

## 2020-10-31 DIAGNOSIS — Z992 Dependence on renal dialysis: Secondary | ICD-10-CM | POA: Diagnosis not present

## 2020-11-01 DIAGNOSIS — N186 End stage renal disease: Secondary | ICD-10-CM | POA: Diagnosis not present

## 2020-11-01 DIAGNOSIS — Z23 Encounter for immunization: Secondary | ICD-10-CM | POA: Diagnosis not present

## 2020-11-01 DIAGNOSIS — Z992 Dependence on renal dialysis: Secondary | ICD-10-CM | POA: Diagnosis not present

## 2020-11-02 ENCOUNTER — Ambulatory Visit: Payer: Medicare Other | Admitting: Gastroenterology

## 2020-11-03 DIAGNOSIS — Z992 Dependence on renal dialysis: Secondary | ICD-10-CM | POA: Diagnosis not present

## 2020-11-03 DIAGNOSIS — N186 End stage renal disease: Secondary | ICD-10-CM | POA: Diagnosis not present

## 2020-11-03 DIAGNOSIS — Z23 Encounter for immunization: Secondary | ICD-10-CM | POA: Diagnosis not present

## 2020-11-05 DIAGNOSIS — Z992 Dependence on renal dialysis: Secondary | ICD-10-CM | POA: Diagnosis not present

## 2020-11-05 DIAGNOSIS — N186 End stage renal disease: Secondary | ICD-10-CM | POA: Diagnosis not present

## 2020-11-05 DIAGNOSIS — Z23 Encounter for immunization: Secondary | ICD-10-CM | POA: Diagnosis not present

## 2020-11-08 DIAGNOSIS — Z23 Encounter for immunization: Secondary | ICD-10-CM | POA: Diagnosis not present

## 2020-11-08 DIAGNOSIS — N186 End stage renal disease: Secondary | ICD-10-CM | POA: Diagnosis not present

## 2020-11-08 DIAGNOSIS — Z992 Dependence on renal dialysis: Secondary | ICD-10-CM | POA: Diagnosis not present

## 2020-11-09 DIAGNOSIS — I255 Ischemic cardiomyopathy: Secondary | ICD-10-CM | POA: Diagnosis not present

## 2020-11-09 DIAGNOSIS — N185 Chronic kidney disease, stage 5: Secondary | ICD-10-CM | POA: Diagnosis not present

## 2020-11-09 DIAGNOSIS — I2510 Atherosclerotic heart disease of native coronary artery without angina pectoris: Secondary | ICD-10-CM | POA: Diagnosis not present

## 2020-11-09 DIAGNOSIS — E1122 Type 2 diabetes mellitus with diabetic chronic kidney disease: Secondary | ICD-10-CM | POA: Diagnosis not present

## 2020-11-12 DIAGNOSIS — Z23 Encounter for immunization: Secondary | ICD-10-CM | POA: Diagnosis not present

## 2020-11-12 DIAGNOSIS — N186 End stage renal disease: Secondary | ICD-10-CM | POA: Diagnosis not present

## 2020-11-12 DIAGNOSIS — Z992 Dependence on renal dialysis: Secondary | ICD-10-CM | POA: Diagnosis not present

## 2020-11-14 ENCOUNTER — Ambulatory Visit (INDEPENDENT_AMBULATORY_CARE_PROVIDER_SITE_OTHER): Payer: Medicare Other | Admitting: *Deleted

## 2020-11-14 DIAGNOSIS — N186 End stage renal disease: Secondary | ICD-10-CM

## 2020-11-14 DIAGNOSIS — I10 Essential (primary) hypertension: Secondary | ICD-10-CM

## 2020-11-15 DIAGNOSIS — N2581 Secondary hyperparathyroidism of renal origin: Secondary | ICD-10-CM | POA: Diagnosis not present

## 2020-11-15 NOTE — Progress Notes (Signed)
Cardiology Office Note    Date:  11/16/2020   ID:  [place], DOB 23-Sep-1946, MRN [id number]  PCP:  Susy Frizzle, MD  Cardiologist: Previously followed by Dr. Bronson Ing --> Needs to establish with new MD (has visit with Dr. Harl Bowie scheduled for later this month) EP: Dr. Rayann Heman AHF: Dr. Haroldine Laws (Inpatient Evaluation Only)  Chief Complaint  Patient presents with  . Follow-up    3 month visit    History of Present Illness:    Bradley Black is a 75 y.o. male with past medical history of CAD(multiple prior interventions with subsequent CABG in 11/2011 with LIMA-LAD, SVG-PDA, SVG-OM2, and SVG-D1), chronic combined systolic and diastolic CHF (EF 63% by echocardiogram in 11/2015, EF 25% by echo in 07/2020 with severely reduced RV function as well - possibly secondary to PVC's by review of notes but not felt to be the culprit per EP), baseline bradycardia, frequent PVC's, carotid artery stenosis, HTN, HLD, Type 2 DM, and ESRD who presents to the office today for 42-month follow-up.   He was last examined by Coletta Memos, NP in 08/2020 and reported his weight had been stable at 140 - 141 lbs on home home scales and he was undergoing HD on a T/Th/Sat schedule. He denied any recent chest pain or palpitations. He was continued on his current medical therapy with Amiodarone 200mg  daily, ASA 81mg  daily, Hydralazine 50mg  TID, and Imdur 30mg  daily. No changes were made to his medication regimen at that time.   In talking with the patient today, he reports feeling fatigued on the days he has dialysis and says this has been occurring since he was initiated on dialysis last year. He denies any associated chest pain or dyspnea on exertion. No recent orthopnea, PND or lower extremity edema. 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 Able to climb a flight of stairs without anginal symptoms. His biggest concern at  this time is his energy level secondary to HD and he is hoping to get started on peritoneal dialysis in the coming months. He is actually scheduled for peritoneal dialysis catheter placement on 11/28/2020.   Past Medical History:  Diagnosis Date  . BPH (benign prostatic hyperplasia)   . CAD S/P percutaneous coronary angioplasty    a. PTCA of [place] b. PCI with BMS to LAD in 1997 c. RCA PCI [place] d. s/p CABG in 11/2011 with LIMA-LAD, SVG-PDA, SVG-OM2, and SVG-D1  . Chronic back pain   . CKD (chronic kidney disease), stage III ([place])   . Cyst of bursa    R shoulder  . Diabetic retinopathy ([place])   . DM (diabetes mellitus), type 2 with renal complications ([place])   . Essential hypertension   . Frequent PVCs   . GERD (gastroesophageal reflux disease)   . Ischemic cardiomyopathy 11/2011   Intra-OP TEE: EF 40-45%, no regional WMA; improved Anterior WM post CABG.  . Left carotid artery stenosis   . Mixed hyperlipidemia   . 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 AV FISTULA PLACEMENT Left 07/28/2020   Procedure: LEFT ARM ARTERIOVENOUS (AV) FISTULA  CREATION;  Surgeon: Rosetta Posner, MD;  Location: [facility];  Service: Vascular;  Laterality: Left;  .  BACK SURGERY  1970  . CARDIAC CATHETERIZATION  2013  . CORONARY ANGIOPLASTY  1994   OM  . CORONARY ANGIOPLASTY  1997   LAD  . CORONARY ANGIOPLASTY WITH STENT PLACEMENT  1999   RCA  . CORONARY ANGIOPLASTY WITH STENT PLACEMENT  2001   RCA  . CORONARY ARTERY BYPASS GRAFT  12/27/2011   Procedure: CORONARY ARTERY BYPASS GRAFTING (CABG);  Surgeon: Rexene Alberts, MD;  Location: [facility];  Service: Open Heart Surgery;  Laterality: N/A;  Times four. On pump. Using endoscopically harvested right greater saphenous vein and left internal mammary artery.   [name] HERNIA REPAIR    . INCISION AND DRAINAGE OF WOUND  2006   [place]  .  INSERTION OF DIALYSIS CATHETER Right 07/25/2020   Procedure: INSERTION OF DIALYSIS CATHETER;  Surgeon: Virl Cagey, MD;  Location: [facility];  Service: General;  Laterality: Right;  . INTRAOPERATIVE TRANSESOPHAGEAL ECHOCARDIOGRAM  12/27/2011   Global hypokinesis with EF of 40-45%, improved LAD distribution wall motion.  [name] LEFT HEART CATHETERIZATION WITH CORONARY ANGIOGRAM N/A 12/13/2011   Procedure: LEFT HEART CATHETERIZATION WITH CORONARY ANGIOGRAM;  Surgeon: Leonie Man, MD;  Location: [facility] C[facility] LAB;  Service: Cardiovascular;  Laterality: N/A;  . LUMBAR FUSION    . MASS EXCISION  10/24/11   R arm  . RIGHT HEART CATH N/A 07/12/2020   Procedure: RIGHT HEART CATH;  Surgeon: Belva Crome, MD;  Location: [facility] CV LAB;  Service: Cardiovascular;  Laterality: N/A;  . TRANSESOPHAGEAL ECHOCARDIOGRAM  2013    Current Medications: Outpatient Medications Prior to Visit  Medication Sig Dispense Refill  . acetaminophen (TYLENOL) 500 MG tablet Take 1,000 mg by mouth every 6 (six) hours as needed for moderate pain or headache.    . ALPRAZolam (XANAX) 0.5 MG tablet Take 1 tablet (0.5 mg total) by mouth at bedtime as needed for sleep. 30 tablet 0  . amiodarone (PACERONE) 200 MG tablet Take 1 tablet (200 mg total) by mouth daily. 90 tablet 3  . aspirin EC 81 MG tablet Take 81 mg by mouth daily.    . calcitRIOL (ROCALTROL) 0.5 MCG capsule Take 1 capsule (0.5 mcg total) by mouth every Monday, Wednesday, and Friday. 45 capsule 1  . isosorbide mononitrate (IMDUR) 30 MG 24 hr tablet Take 1 tablet (30 mg total) by mouth daily. 30 tablet 0  . magnesium oxide (MAG-OX) 400 MG tablet Take 0.5 tablets by mouth daily.    . pantoprazole (PROTONIX) 40 MG tablet TAKE ONE TABLET BY MOUTH TWICE A DAY 180 tablet 3  . hydrALAZINE (APRESOLINE) 50 MG tablet Take 1 tablet (50 mg total) by mouth 3 (three) times daily. 90 tablet 0  . cinacalcet (SENSIPAR) 30 MG tablet Take 30 mg by mouth daily with breakfast.  (Patient  not taking: Reported on 11/14/2020)    . doxazosin (CARDURA) 8 MG tablet Take 1 tablet (8 mg total) by mouth daily. (Patient not taking: Reported on 11/14/2020) 90 tablet 3  . sertraline (ZOLOFT) 50 MG tablet Take 1 tablet (50 mg total) by mouth daily. (Patient not taking: Reported on 11/14/2020) 30 tablet 3   No facility-administered medications prior to visit.     Allergies:   No known allergies   Social History   Socioeconomic History  . Marital status: Married    Spouse name: [name]  . Number of children: 1  . Years of education: college  . Highest education level: Bachelor's degree (e.g., BA, AB, BS)  Occupational History  .  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  Tobacco Use  . Smoking status: Former Smoker    Types: Cigarettes    Quit date: 10/01/1986    Years since quitting: 34.1  . Smokeless tobacco: Never Used  . Tobacco comment: quit about 30 yrs ago  Vaping Use  . Vaping Use: Never used  Substance and Sexual Activity  . Alcohol use: No  . Drug use: No  . Sexual activity: Not on file  Other Topics Concern  . Not on file  Social History Narrative   Married,  father of one,  grandfather 54. Works out at [employer] at [employer] roughly 2-3 days a week. He works on a treadmill. He does note having a hard time getting his heart rate up.   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   He quit smoking in 1988, and does not drink alcohol.   Social Determinants of Health   Financial Resource Strain: Low Risk   . Difficulty of Paying Living Expenses: Not hard at all  Food Insecurity: No Food Insecurity  . Worried About Charity fundraiser in the Last Year: Never true  . Ran Out of Food in the Last Year: Never true  Transportation Needs: No Transportation Needs  . Lack of Transportation (Medical): No  . Lack of Transportation (Non-Medical): No  Physical Activity: Inactive  . Days of Exercise per Week: 0 days  . Minutes of Exercise per Session: 0 min  Stress:  No Stress Concern Present  . Feeling of Stress : Only a little  Social Connections: Unknown  . Frequency of Communication with Friends and Family: More than three times a week  . Frequency of Social Gatherings with Friends and Family: Three times a week  . Attends Religious Services: Not on file  . Active Member of Clubs or Organizations: Yes  . Attends Archivist Meetings: Not on file  . Marital Status: Married     Family History:  The patient's family history includes Cancer in his father and mother.   Review of Systems:   Please see the history of present illness.     General:  No chills, fever, night sweats or weight changes. Positive for fatigue.  Cardiovascular:  No chest pain, dyspnea on exertion, edema, orthopnea, palpitations, paroxysmal nocturnal dyspnea. Dermatological: No rash, lesions/masses Respiratory: No cough, dyspnea Urologic: No hematuria, dysuria Abdominal:   No nausea, vomiting, diarrhea, bright red blood per rectum, melena, or hematemesis Neurologic:  No visual changes, wkns, changes in mental status. All other systems reviewed and are otherwise negative except as noted above.   Physical Exam:    VS:  BP (!) 160/92   Pulse 73   Ht 5\' 8"  (1.727 m)   Wt 142 lb 6.4 oz (64.6 kg)   SpO2 98%   BMI 21.65 kg/m    General: Well developed, well nourished,male appearing in no acute distress. Head: Normocephalic, atraumatic. Neck: No carotid bruits. JVD not elevated.  Lungs: Respirations regular and unlabored, without wheezes or rales.  Heart: Regular rate and rhythm. No S3 or S4.  No murmur, no rubs, or gallops appreciated. Abdomen: Appears non-distended. No obvious abdominal masses. Msk:  Strength and tone appear normal for age. No obvious joint deformities or effusions. Extremities: No clubbing or cyanosis. No lower extremity edema.  Distal pedal pulses are 2+ bilaterally. Neuro: Alert and oriented X 3. Moves all extremities spontaneously. No focal  deficits noted. Psych:  Responds to questions appropriately  with a normal affect. Skin: No rashes or lesions noted  Wt Readings from Last 3 Encounters:  11/16/20 142 lb 6.4 oz (64.6 kg)  10/04/20 143 lb (64.9 kg)  08/29/20 142 lb (64.4 kg)     Studies/Labs Reviewed:   EKG:  EKG is not ordered today. EKG from 07/21/2020 is reviewed and shows sinus bradycardia, HR 50 with LVH.   Recent Labs: 07/08/2020: B Natriuretic Peptide >4,500.0 07/21/2020: ALT 61; Magnesium 2.5 08/02/2020: BUN 80; Creatinine, Ser 4.62; Hemoglobin 11.0; Platelets 148; Potassium 4.3; Sodium 132   Lipid Panel    Component Value Date/Time   CHOL 104 07/13/2020 0118   TRIG 62 07/13/2020 0118   HDL 25 (L) 07/13/2020 0118   CHOLHDL 4.2 07/13/2020 0118   VLDL 12 07/13/2020 0118   LDLCALC 67 07/13/2020 0118   LDLCALC 73 05/01/2019 0945    Additional studies/ records that were reviewed today include:   Echocardiogram: 07/2020 IMPRESSIONS    1. Compared to previous echo, LVEF and RVEF are worse . Left ventricular  ejection fraction, by estimation, is 25%%. The left ventricle has severely  decreased function. The left ventricle demonstrates global hypokinesis.  The left ventricular internal  cavity size was moderately dilated. Left ventricular diastolic parameters  are consistent with Grade III diastolic dysfunction (restrictive).  Elevated left atrial pressure.  2. Right ventricular systolic function is severely reduced. The right  ventricular size is mildly enlarged. There is moderately elevated  pulmonary artery systolic pressure.  3. Left atrial size was severely dilated.  4. Right atrial size was moderately dilated.  5. The mitral valve is normal in structure. Mild to moderate mitral valve  regurgitation.  6. The aortic valve is tricuspid. Aortic valve regurgitation is trivial.  7. The inferior vena cava is dilated in size with <50% respiratory  variability, suggesting right atrial pressure of  15 mmHg.    RHC: 07/2020  Mildly elevated mean pulmonary artery pressure at 21 mmHg.  This is consistent with mild pulmonary hypertension.  Pulmonary capillary wedge pressure mean is 11 mmHg.  Cardiac output 4.6 L/min.  Main pulmonary artery O2 saturation 64%.  Arterial pulse oximetry O2 saturation 99%.  Pulmonary vascular resistance 2.16 Wood units.    Assessment:    1. Coronary artery disease involving coronary bypass graft of native heart without angina pectoris   2. Chronic combined systolic and diastolic heart failure ([place])   3. Essential hypertension   4. PVC (premature ventricular contraction)   5. Preoperative cardiovascular examination      Plan:   In order of problems listed above:  1. CAD - He is CABG in 11/2011 with LIMA-LAD, SVG-PDA, SVG-OM2, and SVG-D1. He denies any recent chest pain or dyspnea on exertion. Will plan for a repeat echocardiogram as outlined below. He remains on ASA 81 mg daily along with Imdur 30 mg daily. He was previously on Pravastatin but this is not on his current medication list. Would readdress at follow-up as he should be on statin therapy with goal LDL less than 70.  2. Chronic Combined Systolic and Diastolic CHF - His EF was at 25% by echo in 07/2020 with severely reduced RV function as well and felt by AHF to possibly be secondary to PVC's by review of notes but not felt to be the culprit per EP. - Will plan for a repeat echocardiogram for reassessment of his EF.  - He remains on Hydralazine and Imdur. Not on a BB given baseline bradycardia and he has not  been on an ACE-I/ARB/ARNI due to ESRD.   3. HTN - BP is at 160/92 during today's visit with similar values by recheck and he reports elevated readings when checked at home. Unaware of any hypotension during HD.  - He is currently on Hydralazine 50mg  TID and Imdur 30mg  daily. Will plan to titrate Hydralazine to 75mg  TID and have him keep a BP log.   4. PVC's - Recent monitor last  year showed an 18% PVC burden and he was ultimately started on Amiodarone 200mg  daily. Will request most recent labs from dialysis to make sure TSH and LFT's have been obtained. If no recent labs, would repeat given the use of Amiodarone.   5. Preoperative Cardiac Clearance for Peritoneal Dialysis Catheter Placement - This is already scheduled at [facility] on 11/28/2020. I did recommend we obtain a repeat echocardiogram for reassessment of his EF as this was at 25% by most recent check and would help guide risk stratification. If improved, would not anticipate further ischemic evaluation as he is able to perform 4 METS of activity without anginal symptoms.    Medication Adjustments/Labs and Tests Ordered: Current medicines are reviewed at length with the patient today.  Concerns regarding medicines are outlined above.  Medication changes, Labs and Tests ordered today are listed in the Patient Instructions below. Patient Instructions  Medication Instructions:  INCREASE Hydralazine to 75 mg three times daily.  *If you need a refill on your cardiac medications before your next appointment, please call your pharmacy*   Lab Work: None Today If you have labs (blood work) drawn today and your tests are completely normal, you will receive your results only by: [name] MyChart Message (if you have MyChart) OR . A paper copy in the mail If you have any lab test that is abnormal or we need to change your treatment, we will call you to review the results.   Testing/Procedures: Your physician has requested that you have an echocardiogram. Echocardiography is a painless test that uses sound waves to create images of your heart. It provides your doctor with information about the size and shape of your heart and how well your heart's chambers and valves are working. This procedure takes approximately one hour. There are no restrictions for this procedure.     Follow-Up: At [facility], you and your  health needs are our priority.  As part of our continuing mission to provide you with exceptional heart care, we have created designated Provider Care Teams.  These Care Teams include your primary Cardiologist (physician) and Advanced Practice Providers (APPs -  Physician Assistants and Nurse Practitioners) who all work together to provide you with the care you need, when you need it.  We recommend signing up for the patient portal called "MyChart".  Sign up information is provided on this After Visit Summary.  MyChart is used to connect with patients for Virtual Visits (Telemedicine).  Patients are able to view lab/test results, encounter notes, upcoming appointments, etc.  Non-urgent messages can be sent to your provider as well.   To learn more about what you can do with MyChart, go to [email or web address].    Your next appointment:   3 month(s)  The format for your next appointment:   In Person  Provider:   You may see Carlyle Dolly, MD or one of the following Advanced Practice Providers on your designated Care Team:    Bernerd Pho, PA-C     Other Instructions None Today  Signed, Erma Heritage, PA-C  11/16/2020 7:34 PM    [facility] [place], [place] [address] Phone: [phone] Fax: [phone]

## 2020-11-15 NOTE — Patient Instructions (Signed)
Visit Information  PATIENT GOALS:  Goals Addressed              This Visit's Progress   .  Follow My Treatment Plan to facilitate improved coping-Chronic Kidney/ESRD        Timeframe:  Long-Range Goal Priority:  High Start Date:   11/14/2020                          Expected End Date:   05/14/2021                    Follow Up Date 12/19/20   - call the doctor or nurse before I stop taking medicine - call the doctor or nurse to get help with side effects - keep follow-up appointments Bradley Black 11/16/20    Dr. Harl Bowie 11/25/20 - talk with your doctor about any worsening depression - expect a call from social worker for counseling, resources - please follow renal/ diabetic diet as well as fluid restrictions Why is this important?    Staying as healthy as you can is very important. This may mean making changes if you smoke, don't exercise or eat poorly.   A healthy lifestyle is an important goal for you.   Following the treatment plan and making changes may be hard.   Try some of these steps to help keep the disease from getting worse.        .  Track and Manage My Blood Pressure (pt-stated)        Timeframe:  Long-Range Goal Priority:  High Start Date:   11/14/2020                          Expected End Date:   05/14/2021                    Follow Up Date 12/19/20  Check blood pressure daily and log, take log to primary care provider appointment Be mindful of salt intake, avoid salty snacks and fast food Follow up with primary care provider as scheduled Take all medications as prescribed   Why is this important?   You won't feel high blood pressure, but it can still hurt your blood vessels.  High blood pressure can cause heart or kidney problems. It can also cause a stroke.  Making lifestyle changes like losing a little weight or eating less salt will help.  Checking your blood pressure at home and at different times of the day can help to control blood pressure.  If the  doctor prescribes medicine remember to take it the way the doctor ordered.  Call the office if you cannot afford the medicine or if there are questions about it.            Consent to CCM Services: Mr. Dieudonne was given information about Chronic Care Management services today including:  1. CCM service includes personalized support from designated clinical staff supervised by his physician, including individualized plan of care and coordination with other care providers 2. 24/7 contact phone numbers for assistance for urgent and routine care needs. 3. Service will only be billed when office clinical staff spend 20 minutes or more in a month to coordinate care. 4. Only one practitioner may furnish and bill the service in a calendar month. 5. The patient may stop CCM services at any time (effective at the end of the month) by phone call to  the office staff. 6. The patient will be responsible for cost sharing (co-pay) of up to 20% of the service fee (after annual deductible is met).  Patient agreed to services and verbal consent obtained.   The patient verbalized understanding of instructions, educational materials, and care plan provided today and declined offer to receive copy of patient instructions, educational materials, and care plan.   Telephone follow up appointment with care management team member scheduled for:  12/19/20  Jacqlyn Larsen [facility], BSN RN Case Manager [facility] [phone]  CLINICAL CARE PLAN: Patient Care Plan: Chronic Kidney (Adult)    Problem Identified: Adjustment to Chronic Kidney Disease     Long-Range Goal: Patient will acheive Optimal Coping related to CKD/ ESRD (dialysis)   Start Date: 11/14/2020  Expected End Date: 05/13/2021  This Visit's Progress: On track  Priority: High  Note:   Current Barriers:   Ineffective Self Health Maintenance- patient having difficulty coping with CKD/ ESRD with dialysis and coming to terms with living with  CKD/ ESRD.  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   Knowledge Deficits related to short term plan for care coordination needs and long term plans for chronic disease management needs Clinical Goal(s):  [name] Collaboration with Susy Frizzle, MD regarding development and update of comprehensive plan of care as evidenced by provider attestation and co-signature . Inter-disciplinary care team collaboration (see longitudinal plan of care)  Over the next 180 days, patient will work with care management team to address care coordination and chronic disease management needs related to:  Mental Health Counseling  Adherence to renal diet and fluid restrictions  Adherence to prescribed medication regimen Interventions:   Evaluation of current treatment plan related to CKD/ ESRD with dialysis self-management and patient's adherence to plan as established by provider.  Collaboration with Susy Frizzle, MD regarding development and update of comprehensive plan of care as evidenced by provider attestation       and co-signature  Inter-disciplinary care team collaboration (see longitudinal plan of care)  Discussed plans with patient for ongoing care management follow up and provided patient with direct contact information for care management team  Mailed patient EMMI education "When you have depression and another health problem"  Reviewed importance of adherence to diabetes diet,  renal diet and fluid restrictions  Reviewed importance of following up with all heatlh care providers and reviewed upcoming appointments - Bradley Black 11/16/20, Dr.  Harl Bowie 11/25/20.  Reviewed medications and importance of adherence  Referral placed for social worker to outreach patient related to depression PHQ9=7 for counseling ,support, resources.- call the doctor or nurse before I stop taking medicine Patient Goals/Self Care Activities:  Over the next 45 days, patient will:  . Patient will self administer medications as prescribed . Patient will attend all scheduled provider appointments . Patient will attend church or other social activities . Patient will call provider office for new concerns or questions  . - call the doctor or nurse before I stop taking medicine . - call the doctor or nurse to get help with side effects . - keep follow-up appointments Bradley Black 11/16/20    Dr. Harl Bowie 11/25/20 . - talk  with your doctor about any worsening depression . - expect a call from social worker for counseling, resources . - please follow renal/ diabetic diet as well as fluid restrictions Follow Up Plan: Telephone follow up appointment with care management team member scheduled for: 12/19/2020    Patient Care Plan: Hypertension (Adult)    Problem Identified: Difficulty managing hypertension   Priority: High    Long-Range Goal: Hypertension Monitored   Start Date: 11/14/2020  Expected End Date: 05/13/2021  This Visit's Progress: On track  Priority: High  Note:   Objective:  . Last practice recorded BP readings:  BP Readings from Last 3 Encounters:  10/04/20 (!) 164/88  09/08/20 120/68  08/29/20 117/68 .   [name] Most recent eGFR/CrCl: No results found for: EGFR  No components found for: CRCL Current Barriers:  [name] Knowledge Deficits related to basic understanding of hypertension pathophysiology and self care management . Patient does check blood pressure daily and continues to have some elevated readings with most recent reading 189/80 . Does try to follow low sodium diet. . Does not adhere to provider recommendations re:  patient plans to speak  with primary care provider at upcoming appointment about not being able to take anti depressant and still has some unresolved depression Case Manager Clinical Goal(s):  [name]  Patient will verbalize understanding of plan for hypertension management as evidenced by taking all medications as prescribed, monitoring and recording blood pressure as directed and adhering to low sodium diet. . Patient will contact primary care provider for elevated readings Interventions:  . Collaboration with Susy Frizzle, MD regarding development and update of comprehensive plan of care as evidenced by provider attestation and co-signature . Inter-disciplinary care team collaboration (see longitudinal plan of care) . Evaluation of current treatment plan related to hypertension self management and patient's adherence to plan as established by provider. . Reviewed medications with patient and discussed importance of compliance . Discussed plans with patient for ongoing care management follow up and provided patient with direct contact information for care management team . Advised patient, providing education and rationale, to monitor blood pressure daily and record, calling PCP for findings outside established parameters.  . Reviewed scheduled/upcoming provider appointments including:  Bradley Black 11/16/20,  Dr. Harl Bowie 11/25/20 Patient Goals/Self-Care Activities . Over the next 45 days, patient will:  . Attends all scheduled provider appointments . Calls provider office for new concerns, questions, or BP outside discussed parameters . Checks BP and records as discussed . Follows a low sodium diet/DASH dietCheck blood pressure daily and log, take log to primary care provider appointment . Be mindful of salt intake, avoid salty snacks and fast food . Follow up with primary care provider as scheduled . Take all medications as prescribed Follow Up Plan: Telephone follow up appointment with care management team member  scheduled for: 12/19/20

## 2020-11-15 NOTE — Chronic Care Management (AMB) (Signed)
Chronic Care Management   CCM RN Visit Note  11/15/2020 Name: Bradley Black MRN: [id number] DOB: [birth year]  Subjective: Bradley Black is a 75 y.o. year old male who is a primary care patient of Pickard, [name], MD. The care management team was consulted for assistance with disease management and care coordination needs.    Engaged with patient by telephone for initial visit in response to provider referral for case management and/or care coordination services.   Consent to Services:  The patient was given the following information about Chronic Care Management services today, agreed to services, and gave verbal consent: 1. CCM service includes personalized support from designated clinical staff supervised by the primary care provider, including individualized plan of care and coordination with other care providers 2. 24/7 contact phone numbers for assistance for urgent and routine care needs. 3. Service will only be billed when office clinical staff spend 20 minutes or more in a month to coordinate care. 4. Only one practitioner may furnish and bill the service in a calendar month. 5.The patient may stop CCM services at any time (effective at the end of the month) by phone call to the office staff. 6. The patient will be responsible for cost sharing (co-pay) of up to 20% of the service fee (after annual deductible is met). Patient agreed to services and consent obtained.  Patient agreed to services and verbal consent obtained.   Assessment: Review of patient past medical history, allergies, medications, health status, including review of consultants reports, laboratory and other test data, was performed as part of comprehensive evaluation and provision of chronic care management services.   SDOH (Social Determinants of Health) assessments and interventions performed:  SDOH Interventions   Flowsheet Row Most Recent Value  SDOH Interventions   Food Insecurity Interventions Intervention Not  Indicated  Financial Strain Interventions Intervention Not Indicated  Housing Interventions Intervention Not Indicated  Intimate Partner Violence Interventions Intervention Not Indicated  Physical Activity Interventions Intervention Not Indicated  [client not interested in exercising at present]  Stress Interventions Intervention Not Indicated  Social Connections Interventions Intervention Not Indicated  Transportation Interventions Intervention Not Indicated  Depression Interventions/Treatment  --  [Client stopped taking zoloft, receptive to referral to social worker]       CCM Care Plan  Allergies  Allergen Reactions  . No Known Allergies     Outpatient Encounter Medications as of 11/14/2020  Medication Sig Note  . acetaminophen (TYLENOL) 500 MG tablet Take 1,000 mg by mouth every 6 (six) hours as needed for moderate pain or headache.   . ALPRAZolam (XANAX) 0.5 MG tablet Take 1 tablet (0.5 mg total) by mouth at bedtime as needed for sleep.   [name] amiodarone (PACERONE) 200 MG tablet Take 1 tablet (200 mg total) by mouth daily.   [name] aspirin EC 81 MG tablet Take 81 mg by mouth daily.   . calcitRIOL (ROCALTROL) 0.5 MCG capsule Take 1 capsule (0.5 mcg total) by mouth every Monday, Wednesday, and Friday.   . hydrALAZINE (APRESOLINE) 50 MG tablet Take 1 tablet (50 mg total) by mouth 3 (three) times daily.   . pantoprazole (PROTONIX) 40 MG tablet TAKE ONE TABLET BY MOUTH TWICE A DAY   . cinacalcet (SENSIPAR) 30 MG tablet Take 30 mg by mouth daily with breakfast.  (Patient not taking: Reported on 11/14/2020)   . doxazosin (CARDURA) 8 MG tablet Take 1 tablet (8 mg total) by mouth daily. (Patient not taking: Reported on 11/14/2020) 09/05/2020: Reports on 09/05/20  he is taking on days he does not have dialysis.  Reports he takes med on Mondays, Wednesdays and Fridays  . isosorbide mononitrate (IMDUR) 30 MG 24 hr tablet Take 1 tablet (30 mg total) by mouth daily. (Patient not taking: Reported on 11/14/2020)    . magnesium oxide (MAG-OX) 400 MG tablet Take 0.5 tablets by mouth daily. (Patient not taking: Reported on 11/14/2020)   . sertraline (ZOLOFT) 50 MG tablet Take 1 tablet (50 mg total) by mouth daily. (Patient not taking: Reported on 11/14/2020)    No facility-administered encounter medications on file as of 11/14/2020.    Patient Active Problem List   Diagnosis Date Noted  . ESRD (end stage renal disease) ([place])   . Malnutrition of moderate degree 07/22/2020  . CKD (chronic kidney disease), stage V ([place]) 07/21/2020  . Dehydration 07/21/2020  . Hyponatremia 07/21/2020  . Prolonged QT interval 07/21/2020  . Generalized weakness 07/21/2020  . Tremors of nervous system 07/20/2020  . Hypocalcemia   . Myoclonic jerking 07/18/2020  . 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 Frequent PVCs   . 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 SOB (shortness of breath) 07/08/2020  . Elevated LFTs 07/08/2020  . Left carotid artery stenosis   . IDA (iron deficiency anemia) 04/13/2020  . GERD (gastroesophageal reflux disease)   . Essential hypertension 07/24/2019  . Anemia in chronic kidney disease 07/24/2019  . Chronic kidney disease, stage IV (severe) ([place]) 07/24/2019  . Hyperkalemia 07/24/2019  . 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 Cervicalgia 03/08/2014  . Whiplash injury to neck 03/08/2014  . Aortic heart murmur 10/18/2013  . Acute myocardial infarction, History of:   [name] Hyperlipidemia LDL goal <70   . Anemia 04/23/2013  . 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 Ischemic cardiomyopathy 11/30/2011  . Actinic keratosis, left scalp 10/04/2011  . Seborrheic keratosis, right lateral leg 10/04/2011    Conditions to be addressed/monitored:HTN and ESRD  Patient Care Plan: Chronic Kidney (Adult)    Problem Identified: Adjustment to  Chronic Kidney Disease     Long-Range Goal: Patient will acheive Optimal Coping related to CKD/ ESRD (dialysis)   Start Date: 11/14/2020  Expected End Date: 05/13/2021  This Visit's Progress: On track  Priority: High  Note:   Current Barriers:   Ineffective Self Health Maintenance- patient having difficulty coping with CKD/ ESRD with dialysis and coming to terms with living with CKD/ ESRD.  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   Knowledge Deficits related to short term plan for care coordination needs and long term plans for chronic disease management needs Clinical Goal(s):  [name] Collaboration with Susy Frizzle, MD regarding development and update of comprehensive plan of care as evidenced by provider attestation and co-signature . Inter-disciplinary care team collaboration (see longitudinal plan of care)  Over the next 180 days, patient will work with care management team to address care coordination and chronic disease management needs related to:  Mental Health Counseling  Adherence to renal diet  and fluid restrictions  Adherence to prescribed medication regimen Interventions:   Evaluation of current treatment plan related to CKD/ ESRD with dialysis self-management and patient's adherence to plan as established by provider.  Collaboration with Susy Frizzle, MD regarding development and update of comprehensive plan of care as evidenced by provider attestation       and co-signature  Inter-disciplinary care team collaboration (see longitudinal plan of care)  Discussed plans with patient for ongoing care management follow up and  provided patient with direct contact information for care management team  Mailed patient EMMI education "When you have depression and another health problem"  Reviewed importance of adherence to diabetes diet,  renal diet and fluid restrictions  Reviewed importance of following up with all heatlh care providers and reviewed upcoming appointments - Stormy Fabian 11/16/20, Dr. Harl Bowie 11/25/20.  Reviewed medications and importance of adherence  Referral placed for social worker to outreach patient related to depression PHQ9=7 for counseling ,support, resources.- call the doctor or nurse before I stop taking medicine Patient Goals/Self Care Activities:  Over the next 45 days, patient will:  . Patient will self administer medications as prescribed . Patient will attend all scheduled provider appointments . Patient will attend church or other social activities . Patient will call provider office for new concerns or questions  . - call the doctor or nurse before I stop taking medicine . - call the doctor or nurse to get help with side effects . - keep follow-up appointments Stormy Fabian 11/16/20    Dr. Harl Bowie 11/25/20 . - talk with your doctor about any worsening depression . - expect a call from social worker for counseling, resources . - please follow renal/ diabetic diet as well as fluid restrictions Follow Up Plan: Telephone follow up appointment with care management team member scheduled for: 12/19/2020    Patient Care Plan: Hypertension (Adult)    Problem Identified: Difficulty managing hypertension   Priority: High    Long-Range Goal: Hypertension Monitored   Start Date: 11/14/2020  Expected End Date: 05/13/2021  This Visit's Progress: On track  Priority: High  Note:   Objective:  . Last practice recorded BP readings:  BP Readings from Last 3 Encounters:  10/04/20 (!) 164/88  09/08/20 120/68  08/29/20 117/68 .   [name] Most recent eGFR/CrCl: No results found for: EGFR  No  components found for: CRCL Current Barriers:  [name] Knowledge Deficits related to basic understanding of hypertension pathophysiology and self care management . Patient does check blood pressure daily and continues to have some elevated readings with most recent reading 189/80 . Does try to follow low sodium diet. . Does not adhere to provider recommendations re:  patient plans to speak with primary care provider at upcoming appointment about not being able to take anti depressant and still has some unresolved depression Case Manager Clinical Goal(s):  [name]  Patient will verbalize understanding of plan for hypertension management as evidenced by taking all medications as prescribed, monitoring and recording blood pressure as directed and adhering to low sodium diet. . Patient will contact primary care provider for elevated readings Interventions:  . Collaboration with Susy Frizzle, MD regarding development and update of comprehensive plan of care as evidenced by provider attestation and co-signature . Inter-disciplinary care team collaboration (see longitudinal plan of care) . Evaluation of current treatment plan related to hypertension self management and patient's adherence to plan as established by provider. . Reviewed medications with patient and discussed importance of compliance .  Discussed plans with patient for ongoing care management follow up and provided patient with direct contact information for care management team . Advised patient, providing education and rationale, to monitor blood pressure daily and record, calling PCP for findings outside established parameters.  . Reviewed scheduled/upcoming provider appointments including:  Stormy Fabian 11/16/20,  Dr. Harl Bowie 11/25/20 Patient Goals/Self-Care Activities . Over the next 45 days, patient will:  . Attends all scheduled provider appointments . Calls provider office for new concerns, questions, or BP outside discussed  parameters . Checks BP and records as discussed . Follows a low sodium diet/DASH dietCheck blood pressure daily and log, take log to primary care provider appointment . Be mindful of salt intake, avoid salty snacks and fast food . Follow up with primary care provider as scheduled . Take all medications as prescribed Follow Up Plan: Telephone follow up appointment with care management team member scheduled for: 12/19/20     Plan:Telephone follow up appointment with care management team member scheduled for:  12/19/20   Jacqlyn Larsen [facility], BSN RN Case Manager [facility] [phone]

## 2020-11-16 ENCOUNTER — Encounter: Payer: Self-pay | Admitting: Student

## 2020-11-16 ENCOUNTER — Other Ambulatory Visit: Payer: Self-pay

## 2020-11-16 ENCOUNTER — Ambulatory Visit: Payer: Medicare Other | Admitting: Student

## 2020-11-16 VITALS — BP 160/92 | HR 73 | Ht 68.0 in | Wt 142.4 lb

## 2020-11-16 DIAGNOSIS — I10 Essential (primary) hypertension: Secondary | ICD-10-CM

## 2020-11-16 DIAGNOSIS — Z01810 Encounter for preprocedural cardiovascular examination: Secondary | ICD-10-CM

## 2020-11-16 DIAGNOSIS — I493 Ventricular premature depolarization: Secondary | ICD-10-CM

## 2020-11-16 DIAGNOSIS — I5042 Chronic combined systolic (congestive) and diastolic (congestive) heart failure: Secondary | ICD-10-CM | POA: Diagnosis not present

## 2020-11-16 DIAGNOSIS — I25810 Atherosclerosis of coronary artery bypass graft(s) without angina pectoris: Secondary | ICD-10-CM | POA: Diagnosis not present

## 2020-11-16 MED ORDER — HYDRALAZINE HCL 50 MG PO TABS: 75.0000 mg | ORAL_TABLET | Freq: Three times a day (TID) | ORAL | 5 refills | Status: DC

## 2020-11-16 NOTE — Patient Instructions (Signed)
Medication Instructions:  INCREASE Hydralazine to 75 mg three times daily.  *If you need a refill on your cardiac medications before your next appointment, please call your pharmacy*   Lab Work: None Today If you have labs (blood work) drawn today and your tests are completely normal, you will receive your results only by: [name] MyChart Message (if you have MyChart) OR . A paper copy in the mail If you have any lab test that is abnormal or we need to change your treatment, we will call you to review the results.   Testing/Procedures: Your physician has requested that you have an echocardiogram. Echocardiography is a painless test that uses sound waves to create images of your heart. It provides your doctor with information about the size and shape of your heart and how well your heart's chambers and valves are working. This procedure takes approximately one hour. There are no restrictions for this procedure.     Follow-Up: At [facility], you and your health needs are our priority.  As part of our continuing mission to provide you with exceptional heart care, we have created designated Provider Care Teams.  These Care Teams include your primary Cardiologist (physician) and Advanced Practice Providers (APPs -  Physician Assistants and Nurse Practitioners) who all work together to provide you with the care you need, when you need it.  We recommend signing up for the patient portal called "MyChart".  Sign up information is provided on this After Visit Summary.  MyChart is used to connect with patients for Virtual Visits (Telemedicine).  Patients are able to view lab/test results, encounter notes, upcoming appointments, etc.  Non-urgent messages can be sent to your provider as well.   To learn more about what you can do with MyChart, go to [email or web address].    Your next appointment:   3 month(s)  The format for your next appointment:   In Person  Provider:   You may see Carlyle Dolly, MD or one of the following Advanced Practice Providers on your designated Care Team:    Bernerd Pho, PA-C     Other Instructions None Today

## 2020-11-17 ENCOUNTER — Telehealth: Payer: Self-pay | Admitting: Student

## 2020-11-17 ENCOUNTER — Telehealth: Payer: Self-pay | Admitting: *Deleted

## 2020-11-17 ENCOUNTER — Ambulatory Visit (INDEPENDENT_AMBULATORY_CARE_PROVIDER_SITE_OTHER): Payer: Medicare Other

## 2020-11-17 DIAGNOSIS — N186 End stage renal disease: Secondary | ICD-10-CM | POA: Diagnosis not present

## 2020-11-17 DIAGNOSIS — Z992 Dependence on renal dialysis: Secondary | ICD-10-CM | POA: Diagnosis not present

## 2020-11-17 DIAGNOSIS — I5042 Chronic combined systolic (congestive) and diastolic (congestive) heart failure: Secondary | ICD-10-CM | POA: Diagnosis not present

## 2020-11-17 DIAGNOSIS — Z23 Encounter for immunization: Secondary | ICD-10-CM | POA: Diagnosis not present

## 2020-11-17 LAB — ECHOCARDIOGRAM COMPLETE
AR max vel: 2.05 cm2
AV Area VTI: 2.09 cm2
AV Area mean vel: 2.14 cm2
AV Mean grad: 3.3 mmHg
AV Peak grad: 7.4 mmHg
Ao pk vel: 1.36 m/s
Area-P 1/2: 1.85 cm2
Calc EF: 35.4 %
MV M vel: 4.76 m/s
MV Peak grad: 90.6 mmHg
S' Lateral: 4.65 cm
Single Plane A2C EF: 34.2 %
Single Plane A4C EF: 35.4 %

## 2020-11-17 NOTE — Telephone Encounter (Signed)
Pre-cert Verification for the following procedure    ECHO   DATE: 11/17/2020  LOCATION: [place]

## 2020-11-17 NOTE — Chronic Care Management (AMB) (Signed)
  Chronic Care Management   Note  11/17/2020 Name: Seymore Yahshua Thibault MRN: [id number] DOB: 08/17/1946  Cordarious Stephan Nelis is a 75 y.o. year old male who is a primary care patient of Pickard, [name], MD. Courvoisier Murrell Dome is currently enrolled in care management services. An additional referral for  Licensed clinical social worker was placed.   Follow up plan: Telephone appointment with care management team member scheduled for: 11/18/2020 [employer] [facility]

## 2020-11-18 ENCOUNTER — Telehealth: Payer: Medicare Other

## 2020-11-18 NOTE — Progress Notes (Signed)
Scheduled 11/18/20

## 2020-11-19 DIAGNOSIS — Z23 Encounter for immunization: Secondary | ICD-10-CM | POA: Diagnosis not present

## 2020-11-19 DIAGNOSIS — Z992 Dependence on renal dialysis: Secondary | ICD-10-CM | POA: Diagnosis not present

## 2020-11-19 DIAGNOSIS — N186 End stage renal disease: Secondary | ICD-10-CM | POA: Diagnosis not present

## 2020-11-21 ENCOUNTER — Telehealth: Payer: Self-pay | Admitting: Student

## 2020-11-21 ENCOUNTER — Telehealth: Payer: Self-pay | Admitting: *Deleted

## 2020-11-21 NOTE — Telephone Encounter (Addendum)
  Chronic Care Management   Outreach Note  11/21/2020 Name: Bradley Black MRN: [id number] DOB: 1946-07-28  Referred by: Susy Frizzle, MD Reason for referral : Chronic Care Management (Unsuccessful outreach call)   An unsuccessful telephone outreach was attempted today. The patient was referred to the case management team for assistance with care management and care coordination.     Follow Up Plan: The care management team will reach out to the patient again over the next 5 days.   Eduard Clos MSW, LCSW Licensed Clinical Social Worker [facility] [phone]

## 2020-11-21 NOTE — Telephone Encounter (Signed)
Tried contacting Earlyne Iba. att [facility] without success. Voicemail to call back was left.

## 2020-11-21 NOTE — Telephone Encounter (Signed)
     Yes, he has been cleared to proceed. Please reference the echocardiogram result note from 11/17/2020. The clearance form was faxed to their office last week as well.   Signed, Erma Heritage, PA-C 11/21/2020, 2:15 PM Pager: [phone]

## 2020-11-21 NOTE — Telephone Encounter (Signed)
Contacted Earlyne Iba. @ [facility]. Verbalized patients surgical clearance issued by Bernerd Pho, PA-C on 11/16/2020.

## 2020-11-21 NOTE — Telephone Encounter (Signed)
Per phone call from [place] w/ [facility]   She's calling to see if pt has been cleared to have surgery, had apt w/ B. Strader but it does not mention it in her note.   Please call [phone]

## 2020-11-22 DIAGNOSIS — Z992 Dependence on renal dialysis: Secondary | ICD-10-CM | POA: Diagnosis not present

## 2020-11-22 DIAGNOSIS — N186 End stage renal disease: Secondary | ICD-10-CM | POA: Diagnosis not present

## 2020-11-22 DIAGNOSIS — Z23 Encounter for immunization: Secondary | ICD-10-CM | POA: Diagnosis not present

## 2020-11-24 ENCOUNTER — Ambulatory Visit: Payer: Medicare Other | Admitting: *Deleted

## 2020-11-24 DIAGNOSIS — F339 Major depressive disorder, recurrent, unspecified: Secondary | ICD-10-CM

## 2020-11-24 DIAGNOSIS — Z992 Dependence on renal dialysis: Secondary | ICD-10-CM | POA: Diagnosis not present

## 2020-11-24 DIAGNOSIS — N186 End stage renal disease: Secondary | ICD-10-CM | POA: Diagnosis not present

## 2020-11-24 DIAGNOSIS — Z23 Encounter for immunization: Secondary | ICD-10-CM | POA: Diagnosis not present

## 2020-11-24 DIAGNOSIS — I10 Essential (primary) hypertension: Secondary | ICD-10-CM

## 2020-11-25 ENCOUNTER — Ambulatory Visit: Payer: Medicare Other | Admitting: Cardiology

## 2020-11-25 ENCOUNTER — Telehealth: Payer: Self-pay

## 2020-11-25 NOTE — Patient Instructions (Signed)
Visit Information  PATIENT GOALS: Goals Addressed            This Visit's Progress   . Reduce feelings of depression       Timeframe:  Long-Range Goal Priority:  High Start Date:        11/24/2020                     Expected End Date:   02/28/2021                    Follow Up Date 12/01/2020    - avoid negative self-talk - exercise at least 2 to 3 times per week - have a plan for how to handle bad days - spend time or talk with others at least 2 to 3 times per week - watch for early signs of feeling worse  -plan to discuss medication options with your doctor   Why is this important?    Keeping track of your progress will help your treatment team find the right mix of medicine and therapy for you.   Write in your journal every day.   Day-to-day changes in depression symptoms are normal. It may be more helpful to check your progress at the end of each week instead of every day.     Notes:        The patient verbalized understanding of instructions, educational materials, and care plan provided today and declined offer to receive copy of patient instructions, educational materials, and care plan.   The care management team will reach out to the patient again over the next 10 days.   Eduard Clos MSW, LCSW Licensed Clinical Social Worker [facility] [phone]

## 2020-11-25 NOTE — Chronic Care Management (AMB) (Signed)
Chronic Care Management    Clinical Social Work Note  11/25/2020 Name: Bradley Black MRN: [id number] DOB: [birth year]  Bradley Black is a 75 y.o. year old male who is a primary care patient of Pickard, [name], MD. The CCM team was consulted to assist the patient with chronic disease management and/or care coordination needs related to: Mental Health Counseling and Resources.   Engaged with patient by telephone for initial visit in response to provider referral for social work chronic care management and care coordination services.   Consent to Services:  The patient was given information about Chronic Care Management services, agreed to services, and gave verbal consent prior to initiation of services.  Please see initial visit note for detailed documentation.   Patient agreed to services and consent obtained.   Assessment: Review of patient past medical history, allergies, medications, and health status, including review of relevant consultants reports was performed today as part of a comprehensive evaluation and provision of chronic care management and care coordination services.     SDOH (Social Determinants of Health) assessments and interventions performed:  SDOH Interventions   Flowsheet Row Most Recent Value  SDOH Interventions   Depression Interventions/Treatment  Medication, Counseling       Advanced Directives Status: Not addressed in this encounter.  CCM Care Plan  Allergies  Allergen Reactions  . No Known Allergies     Outpatient Encounter Medications as of 11/24/2020  Medication Sig  . acetaminophen (TYLENOL) 500 MG tablet Take 1,000 mg by mouth every 6 (six) hours as needed for moderate pain or headache.  . ALPRAZolam (XANAX) 0.5 MG tablet Take 1 tablet (0.5 mg total) by mouth at bedtime as needed for sleep.  [name] amiodarone (PACERONE) 200 MG tablet Take 1 tablet (200 mg total) by mouth daily.  [name] aspirin EC 81 MG tablet Take 81 mg by mouth daily.  . calcitRIOL  (ROCALTROL) 0.5 MCG capsule Take 1 capsule (0.5 mcg total) by mouth every Monday, Wednesday, and Friday.  . hydrALAZINE (APRESOLINE) 50 MG tablet Take 1.5 tablets (75 mg total) by mouth 3 (three) times daily.  . isosorbide mononitrate (IMDUR) 30 MG 24 hr tablet Take 1 tablet (30 mg total) by mouth daily.  . magnesium oxide (MAG-OX) 400 MG tablet Take 0.5 tablets by mouth daily.  . pantoprazole (PROTONIX) 40 MG tablet TAKE ONE TABLET BY MOUTH TWICE A DAY   No facility-administered encounter medications on file as of 11/24/2020.    Patient Active Problem List   Diagnosis Date Noted  . ESRD (end stage renal disease) ([place])   . Malnutrition of moderate degree 07/22/2020  . CKD (chronic kidney disease), stage V ([place]) 07/21/2020  . Dehydration 07/21/2020  . Hyponatremia 07/21/2020  . Prolonged QT interval 07/21/2020  . Generalized weakness 07/21/2020  . Tremors of nervous system 07/20/2020  . Hypocalcemia   . Myoclonic jerking 07/18/2020  . Biventricular heart failure ([place])   . Frequent PVCs   . 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[place])   . SOB (shortness of breath) 07/08/2020  . Elevated LFTs 07/08/2020  . Left carotid artery stenosis   . IDA (iron deficiency anemia) 04/13/2020  . GERD (gastroesophageal reflux disease)   . Essential hypertension 07/24/2019  . Anemia in chronic kidney disease 07/24/2019  . Chronic kidney disease, stage IV (severe) ([place]) 07/24/2019  . Hyperkalemia 07/24/2019  . 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 Type 2 diabetes mellitus with diabetic chronic kidney disease ([place])  07/19/2016  . Cervicalgia 03/08/2014  . Whiplash injury to neck 03/08/2014  . Aortic heart murmur 10/18/2013  . Acute myocardial infarction, History of:   [name] Hyperlipidemia LDL goal <70   . Anemia 04/23/2013  . 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 Ischemic cardiomyopathy 11/30/2011  . Actinic keratosis, left  scalp 10/04/2011  . Seborrheic keratosis, right lateral leg 10/04/2011    Conditions to be addressed/monitored: ESRD and Depression; Mental Health Concerns   Care Plan : LCSW Plan of Care  Updates made by Deirdre Peer, LCSW since 11/25/2020 12:00 AM    Problem: Knowledge barriers related to management of depression and new dialysis treatment   Priority: High    Long-Range Goal: Improve managment of symptoms of depression   Start Date: 11/24/2020  Expected End Date: 02/28/2021  This Visit's Progress: On track  Priority: High  Note:   Current Barriers:  . Acute Mental Health needs related to depression related to coping with new health issues requiring dialysis.  . Pt is currently not on a treatment plan (RX) due to the medicine prescribed making pt feel bad.  . Mental Health Concerns  . Suicidal Ideation/Homicidal Ideation: No Pt does report he has had "thoughts" in the past; has no plan and has contact #'s for emergency/crisis interventions . Pt does not with to pursue behavioral health counseling at this time; stating he does not have time with his busy work schedule and HD.  He is hoping to transition to home-dialysis and may reconsider at a later time. Clinical Social Work [employer]):  . patient will work with SW bi-weekly by telephone or in person to reduce or manage symptoms related to depression . Over the next 10 days, patient will work with LCSW and PCP  to address needs related to medication options for treatment of depression Interventions: . Patient interviewed and appropriate assessments performed: PHQ 9 . Patient interviewed and appropriate assessments performed . Provided patient with information about referral to behavior health counseling/therapy services available . Discussed plans with patient for ongoing care management follow up and provided patient with direct contact information for care management team . Collaborated with primary care provider re: depression and  need to reassess and consider alternative anti-depressant options for pt to try . Motivational Interviewing, Solution-Focused Strategies, Brief CBT, Emotional/Supportive Counseling, and Consulted with MD re: RX needs to be reassessed for alternative anti-depressant  options Patient Coping Strengths:  . Supportive Relationships . Family . Hopefulness . Able to Communicate Effectively . Pt works part-time Probation officer to local stores Patient Self Care Deficits:  . Unable to independently manage symptoms related to depression . Does not adhere to prescribed medication regimen Patient Goals: avoid negative self-talk - exercise at least 2 to 3 times per week - have a plan for how to handle bad days - spend time or talk with others at least 2 to 3 times per week - watch for early signs of feeling worse  -plan to discuss medication options with your doctor  Follow Up Plan: SW will follow up with patient by phone over the next week for updates on any follow up, RX decisions by PCP.        Follow Up Plan: SW will follow up with patient by phone over the next 10 days      Eduard Clos MSW, [place] Licensed Clinical Social Worker [place] [phone]

## 2020-11-25 NOTE — Telephone Encounter (Signed)
Samples of Linzess 262mcg put back in the closet left for the pt . Never picked up (2 boxes)

## 2020-11-26 DIAGNOSIS — N186 End stage renal disease: Secondary | ICD-10-CM | POA: Diagnosis not present

## 2020-11-26 DIAGNOSIS — Z992 Dependence on renal dialysis: Secondary | ICD-10-CM | POA: Diagnosis not present

## 2020-11-26 DIAGNOSIS — Z23 Encounter for immunization: Secondary | ICD-10-CM | POA: Diagnosis not present

## 2020-11-28 ENCOUNTER — Telehealth: Payer: Self-pay | Admitting: *Deleted

## 2020-11-28 DIAGNOSIS — E1122 Type 2 diabetes mellitus with diabetic chronic kidney disease: Secondary | ICD-10-CM | POA: Diagnosis not present

## 2020-11-28 DIAGNOSIS — N185 Chronic kidney disease, stage 5: Secondary | ICD-10-CM | POA: Diagnosis not present

## 2020-11-28 DIAGNOSIS — Z992 Dependence on renal dialysis: Secondary | ICD-10-CM | POA: Diagnosis not present

## 2020-11-28 DIAGNOSIS — N186 End stage renal disease: Secondary | ICD-10-CM | POA: Diagnosis not present

## 2020-11-28 DIAGNOSIS — I132 Hypertensive heart and chronic kidney disease with heart failure and with stage 5 chronic kidney disease, or end stage renal disease: Secondary | ICD-10-CM | POA: Diagnosis not present

## 2020-11-28 DIAGNOSIS — R001 Bradycardia, unspecified: Secondary | ICD-10-CM | POA: Diagnosis not present

## 2020-11-28 DIAGNOSIS — I517 Cardiomegaly: Secondary | ICD-10-CM | POA: Diagnosis not present

## 2020-11-28 DIAGNOSIS — Z87891 Personal history of nicotine dependence: Secondary | ICD-10-CM | POA: Diagnosis not present

## 2020-11-28 DIAGNOSIS — Z7984 Long term (current) use of oral hypoglycemic drugs: Secondary | ICD-10-CM | POA: Diagnosis not present

## 2020-11-28 DIAGNOSIS — Z955 Presence of coronary angioplasty implant and graft: Secondary | ICD-10-CM | POA: Diagnosis not present

## 2020-11-28 DIAGNOSIS — Z951 Presence of aortocoronary bypass graft: Secondary | ICD-10-CM | POA: Diagnosis not present

## 2020-11-28 DIAGNOSIS — I2510 Atherosclerotic heart disease of native coronary artery without angina pectoris: Secondary | ICD-10-CM | POA: Diagnosis not present

## 2020-11-28 NOTE — Telephone Encounter (Signed)
Call placed to patient. LMTRC.  

## 2020-11-28 NOTE — Telephone Encounter (Signed)
-----   Message from Susy Frizzle, MD sent at 11/28/2020  6:38 AM EST ----- Please schedule phone visit to discuss depression.

## 2020-11-29 DIAGNOSIS — Z992 Dependence on renal dialysis: Secondary | ICD-10-CM | POA: Diagnosis not present

## 2020-11-29 DIAGNOSIS — N186 End stage renal disease: Secondary | ICD-10-CM | POA: Diagnosis not present

## 2020-11-29 NOTE — Telephone Encounter (Signed)
Appointment scheduled.

## 2020-12-01 ENCOUNTER — Telehealth: Payer: Medicare Other

## 2020-12-02 ENCOUNTER — Telehealth (INDEPENDENT_AMBULATORY_CARE_PROVIDER_SITE_OTHER): Payer: Medicare Other | Admitting: Family Medicine

## 2020-12-02 ENCOUNTER — Other Ambulatory Visit: Payer: Self-pay

## 2020-12-02 DIAGNOSIS — F339 Major depressive disorder, recurrent, unspecified: Secondary | ICD-10-CM | POA: Diagnosis not present

## 2020-12-02 DIAGNOSIS — N186 End stage renal disease: Secondary | ICD-10-CM | POA: Diagnosis not present

## 2020-12-02 MED ORDER — DESVENLAFAXINE SUCCINATE ER 25 MG PO TB24: 25.0000 mg | ORAL_TABLET | Freq: Every day | ORAL | 3 refills | Status: DC

## 2020-12-02 NOTE — Progress Notes (Signed)
Subjective:    Patient ID: Bradley Black, male    DOB: Apr 26, 1946, 75 y.o.   MRN: [id number]  HPI  10/13/20 Patient is being seen today as a telephone visit.  Phone call began at 330.  Phone call concluded at 349.  Patient consents to be seen via telephone.  He is currently at home.  I am currently in my office.  Patient had been seen twice in the last month or so.  First he was diagnosed with depression and I started him on Zoloft 50 mg p.o. nightly.  He states that he is doing much better on the Zoloft.  The depression has improved dramatically.  He still has some lingering feelings of depression but they are definitely improving.  He is now sleeping much better at night.  The anhedonia is improving.  He does still occasionally have to take a Xanax to help him get to sleep especially if he is just laying in bed staring at the ceiling however he is not needing the Xanax on a daily basis.  He denies any suicidal ideation.  He denies any homicidal ideation.  He denies any anhedonia.  Second issue is his recent prostatitis.  I started the patient on Bactrim single strength tablets for prostatitis on January 4.  He states that his symptoms have completely gone away on the antibiotics.  I was questioning whether he would benefit from being back on an alpha-blocker as the symptoms seem to get worse and really exacerbated when he stopped his doxazosin.  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  However after finishing the antibiotics he states that the symptoms have totally subsided.  At that time, my plan was: I am happy to hear that the patient's depression is better.  We will continue Zoloft 50 mg p.o. nightly at the present time.  Monitor over the next 3 to 4 weeks.  If he is still dealing with depression at that point, we may want to consider increasing Zoloft to 100 mg at night however he is satisfied with the 50 mg performance thus far.  Regarding  his prostatitis, symptoms have totally resolved after the antibiotics.  Therefore we will not extend the duration of the antibiotics or add back an alpha-blocker.  Patient will notify me if he continues to have increased urinary frequency or urgency or other lower urinary tract symptoms.  12/02/20 Patient is being seen today as a telephone visit.  He consents to be seen via telephone.  He is currently at home.  I am currently in my office.  Phone call began at 1154.  Phone call concluded at 12:05.  Patient stopped Zoloft after I spoke with him last time because he states that it made the depression worse.  He states that he just feels sad.  He lacks hope for the future.  He does not see the point in living.  He has been through so much over the last year including kidney failure that he reports anhedonia and constant feeling of depression.  He denies any suicidal ideation or suicidal plan however the depression does seem to be worse.  Originally I considered Effexor however he has a history of long QTc syndrome and therefore this is not recommended.  Given his dialysis and end-stage renal disease they recommend against Wellbutrin as well as Cymbalta. Past Medical History:  Diagnosis Date  . BPH (benign prostatic hyperplasia)   . CAD S/P percutaneous coronary  angioplasty    a. PTCA of [place] b. PCI with BMS to LAD in 1997 c. RCA PCI [place] d. s/p CABG in 11/2011 with LIMA-LAD, SVG-PDA, SVG-OM2, and SVG-D1  . Chronic back pain   . CKD (chronic kidney disease), stage III ([place])   . Cyst of bursa    R shoulder  . Diabetic retinopathy ([place])   . DM (diabetes mellitus), type 2 with renal complications ([place])   . Essential hypertension   . Frequent PVCs   . GERD (gastroesophageal reflux disease)   . Ischemic cardiomyopathy 11/2011   Intra-OP TEE: EF 40-45%, no regional WMA; improved Anterior WM post CABG.  . Left carotid artery stenosis   . Mixed hyperlipidemia   . 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 AV FISTULA PLACEMENT Left 07/28/2020   Procedure: LEFT ARM ARTERIOVENOUS (AV) FISTULA  CREATION;  Surgeon: Rosetta Posner, MD;  Location: [facility];  Service: Vascular;  Laterality: Left;  . BACK SURGERY  1970  . CARDIAC CATHETERIZATION  2013  . CORONARY ANGIOPLASTY  1994   OM  . CORONARY ANGIOPLASTY  1997   LAD  . CORONARY ANGIOPLASTY WITH STENT PLACEMENT  1999   RCA  . CORONARY ANGIOPLASTY WITH STENT PLACEMENT  2001   RCA  . CORONARY ARTERY BYPASS GRAFT  12/27/2011   Procedure: CORONARY ARTERY BYPASS GRAFTING (CABG);  Surgeon: Rexene Alberts, MD;  Location: [facility];  Service: Open Heart Surgery;  Laterality: N/A;  Times four. On pump. Using endoscopically harvested right greater saphenous vein and left internal mammary artery.   [name] HERNIA REPAIR    . INCISION AND DRAINAGE OF WOUND  2006   [place]  . INSERTION OF DIALYSIS CATHETER Right 07/25/2020   Procedure: INSERTION OF DIALYSIS CATHETER;  Surgeon: Virl Cagey, MD;  Location: [facility];  Service: General;  Laterality: Right;  . INTRAOPERATIVE TRANSESOPHAGEAL ECHOCARDIOGRAM  12/27/2011   Global hypokinesis with EF of 40-45%, improved LAD distribution wall motion.  [name] LEFT HEART CATHETERIZATION WITH CORONARY ANGIOGRAM N/A 12/13/2011   Procedure: LEFT HEART CATHETERIZATION WITH CORONARY ANGIOGRAM;  Surgeon: Leonie Man, MD;  Location: [facility] CATH LAB;  Service: Cardiovascular;  Laterality: N/A;  . LUMBAR FUSION    . MASS EXCISION  10/24/11   R arm  . RIGHT HEART CATH N/A 07/12/2020   Procedure: RIGHT HEART CATH;  Surgeon: Belva Crome, MD;  Location: [facility] CV LAB;  Service: Cardiovascular;  Laterality: N/A;  . TRANSESOPHAGEAL ECHOCARDIOGRAM  2013   Current Outpatient Medications on File Prior to Visit  Medication Sig Dispense Refill  . acetaminophen (TYLENOL) 500 MG tablet Take 1,000 mg by mouth  every 6 (six) hours as needed for moderate pain or headache.    . ALPRAZolam (XANAX) 0.5 MG tablet Take 1 tablet (0.5 mg total) by mouth at bedtime as needed for sleep. 30 tablet 0  . amiodarone (PACERONE) 200 MG tablet Take 1 tablet (200 mg total) by mouth daily. 90 tablet 3  . aspirin EC 81 MG tablet Take 81 mg by mouth daily.    . calcitRIOL (ROCALTROL) 0.5 MCG capsule Take 1 capsule (0.5 mcg total) by mouth every Monday, Wednesday, and Friday. 45 capsule 1  . hydrALAZINE (APRESOLINE) 50 MG tablet Take 1.5 tablets (75 mg total) by mouth  3 (three) times daily. 270 tablet 5  . isosorbide mononitrate (IMDUR) 30 MG 24 hr tablet Take 1 tablet (30 mg total) by mouth daily. 30 tablet 0  . magnesium oxide (MAG-OX) 400 MG tablet Take 0.5 tablets by mouth daily.    . pantoprazole (PROTONIX) 40 MG tablet TAKE ONE TABLET BY MOUTH TWICE A DAY 180 tablet 3   No current facility-administered medications on file prior to visit.   Allergies  Allergen Reactions  . No Known Allergies    Social History   Socioeconomic History  . Marital status: Married    Spouse name: [name]  . Number of children: 1  . Years of education: college  . Highest education level: Bachelor's degree (e.g., BA, AB, BS)  Occupational History  . 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  Tobacco Use  . Smoking status: Former Smoker    Types: Cigarettes    Quit date: 10/01/1986    Years since quitting: 34.1  . Smokeless tobacco: Never Used  . Tobacco comment: quit about 30 yrs ago  Vaping Use  . Vaping Use: Never used  Substance and Sexual Activity  . Alcohol use: No  . Drug use: No  . Sexual activity: Not on file  Other Topics Concern  . Not on file  Social History Narrative   Married,  father of one,  grandfather 56. Works out at [employer] at [employer] roughly 2-3 days a week. He works on a treadmill. He does note having a hard time getting his heart rate up.   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    He quit smoking in 1988, and does not drink alcohol.   Social Determinants of Health   Financial Resource Strain: Low Risk   . Difficulty of Paying Living Expenses: Not hard at all  Food Insecurity: No Food Insecurity  . Worried About Charity fundraiser in the Last Year: Never true  . Ran Out of Food in the Last Year: Never true  Transportation Needs: No Transportation Needs  . Lack of Transportation (Medical): No  . Lack of Transportation (Non-Medical): No  Physical Activity: Inactive  . Days of Exercise per Week: 0 days  . Minutes of Exercise per Session: 0 min  Stress: No Stress Concern Present  . Feeling of Stress : Only a little  Social Connections: Unknown  . Frequency of Communication with Friends and Family: More than three times a week  . Frequency of Social Gatherings with Friends and Family: Three times a week  . Attends Religious Services: Not on file  . Active Member of Clubs or Organizations: Yes  . Attends Archivist Meetings: Not on file  . Marital Status: Married  Human resources officer Violence: Not At Risk  . Fear of Current or Ex-Partner: No  . Emotionally Abused: No  . Physically Abused: No  . Sexually Abused: No      Review of Systems  All other systems reviewed and are negative.      Objective:   Physical Exam        Assessment & Plan:  End stage renal disease (HCC)  Depression, recurrent ([place])  Due to his history of long QT interval as well as end-stage renal disease requiring hemodialysis I will start the patient on Pristiq 25 mg p.o. daily and reassess in 3 to 4 weeks.  Maximum dose will be 50 mg.  Monitor for any side effects.

## 2020-12-03 ENCOUNTER — Emergency Department (HOSPITAL_COMMUNITY): Payer: Medicare Other

## 2020-12-03 ENCOUNTER — Emergency Department (HOSPITAL_COMMUNITY)
Admission: EM | Admit: 2020-12-03 | Discharge: 2020-12-04 | Disposition: A | Discharge status: Home / Self Care | Payer: Medicare Other | Attending: Emergency Medicine | Admitting: Emergency Medicine

## 2020-12-03 ENCOUNTER — Encounter (HOSPITAL_COMMUNITY): Payer: Self-pay | Admitting: *Deleted

## 2020-12-03 DIAGNOSIS — E1122 Type 2 diabetes mellitus with diabetic chronic kidney disease: Secondary | ICD-10-CM | POA: Insufficient documentation

## 2020-12-03 DIAGNOSIS — E875 Hyperkalemia: Secondary | ICD-10-CM | POA: Diagnosis not present

## 2020-12-03 DIAGNOSIS — I5082 Biventricular heart failure: Secondary | ICD-10-CM | POA: Insufficient documentation

## 2020-12-03 DIAGNOSIS — N186 End stage renal disease: Secondary | ICD-10-CM | POA: Diagnosis not present

## 2020-12-03 DIAGNOSIS — Z789 Other specified health status: Secondary | ICD-10-CM

## 2020-12-03 DIAGNOSIS — Z452 Encounter for adjustment and management of vascular access device: Secondary | ICD-10-CM | POA: Diagnosis not present

## 2020-12-03 DIAGNOSIS — Z87891 Personal history of nicotine dependence: Secondary | ICD-10-CM | POA: Diagnosis not present

## 2020-12-03 DIAGNOSIS — Z951 Presence of aortocoronary bypass graft: Secondary | ICD-10-CM | POA: Diagnosis not present

## 2020-12-03 DIAGNOSIS — Y828 Other medical devices associated with adverse incidents: Secondary | ICD-10-CM | POA: Insufficient documentation

## 2020-12-03 DIAGNOSIS — Z7982 Long term (current) use of aspirin: Secondary | ICD-10-CM | POA: Insufficient documentation

## 2020-12-03 DIAGNOSIS — Z955 Presence of coronary angioplasty implant and graft: Secondary | ICD-10-CM | POA: Insufficient documentation

## 2020-12-03 DIAGNOSIS — M47814 Spondylosis without myelopathy or radiculopathy, thoracic region: Secondary | ICD-10-CM | POA: Diagnosis not present

## 2020-12-03 DIAGNOSIS — I132 Hypertensive heart and chronic kidney disease with heart failure and with stage 5 chronic kidney disease, or end stage renal disease: Secondary | ICD-10-CM | POA: Diagnosis not present

## 2020-12-03 DIAGNOSIS — Z992 Dependence on renal dialysis: Secondary | ICD-10-CM | POA: Diagnosis not present

## 2020-12-03 DIAGNOSIS — R109 Unspecified abdominal pain: Secondary | ICD-10-CM | POA: Insufficient documentation

## 2020-12-03 DIAGNOSIS — T82598A Other mechanical complication of other cardiac and vascular devices and implants, initial encounter: Secondary | ICD-10-CM | POA: Insufficient documentation

## 2020-12-03 DIAGNOSIS — Z79899 Other long term (current) drug therapy: Secondary | ICD-10-CM | POA: Diagnosis not present

## 2020-12-03 DIAGNOSIS — E11319 Type 2 diabetes mellitus with unspecified diabetic retinopathy without macular edema: Secondary | ICD-10-CM | POA: Insufficient documentation

## 2020-12-03 DIAGNOSIS — I10 Essential (primary) hypertension: Secondary | ICD-10-CM | POA: Diagnosis not present

## 2020-12-03 LAB — CBC WITH DIFFERENTIAL/PLATELET
Abs Immature Granulocytes: 0.05 10*3/uL (ref 0.00–0.07)
Basophils Absolute: 0 10*3/uL (ref 0.0–0.1)
Basophils Relative: 0 %
Eosinophils Absolute: 0.3 10*3/uL (ref 0.0–0.5)
Eosinophils Relative: 3 %
HCT: 36.7 % — ABNORMAL LOW (ref 39.0–52.0)
Hemoglobin: 12.1 g/dL — ABNORMAL LOW (ref 13.0–17.0)
Immature Granulocytes: 1 %
Lymphocytes Relative: 7 %
Lymphs Abs: 0.7 10*3/uL (ref 0.7–4.0)
MCH: 33.5 pg (ref 26.0–34.0)
MCHC: 33 g/dL (ref 30.0–36.0)
MCV: 101.7 fL — ABNORMAL HIGH (ref 80.0–100.0)
Monocytes Absolute: 0.7 10*3/uL (ref 0.1–1.0)
Monocytes Relative: 7 %
Neutro Abs: 8.7 10*3/uL — ABNORMAL HIGH (ref 1.7–7.7)
Neutrophils Relative %: 82 %
Platelets: 217 10*3/uL (ref 150–400)
RBC: 3.61 MIL/uL — ABNORMAL LOW (ref 4.22–5.81)
RDW: 13 % (ref 11.5–15.5)
WBC: 10.5 10*3/uL (ref 4.0–10.5)
nRBC: 0 % (ref 0.0–0.2)

## 2020-12-03 LAB — COMPREHENSIVE METABOLIC PANEL
ALT: 22 U/L (ref 0–44)
AST: 22 U/L (ref 15–41)
Albumin: 3.4 g/dL — ABNORMAL LOW (ref 3.5–5.0)
Alkaline Phosphatase: 83 U/L (ref 38–126)
Anion gap: 9 (ref 5–15)
BUN: 62 mg/dL — ABNORMAL HIGH (ref 8–23)
CO2: 21 mmol/L — ABNORMAL LOW (ref 22–32)
Calcium: 10.1 mg/dL (ref 8.9–10.3)
Chloride: 104 mmol/L (ref 98–111)
Creatinine, Ser: 3.87 mg/dL — ABNORMAL HIGH (ref 0.61–1.24)
GFR, Estimated: 15 mL/min — ABNORMAL LOW (ref >60)
Glucose, Bld: 217 mg/dL — ABNORMAL HIGH (ref 70–99)
Potassium: 5.5 mmol/L — ABNORMAL HIGH (ref 3.5–5.1)
Sodium: 134 mmol/L — ABNORMAL LOW (ref 135–145)
Total Bilirubin: 1 mg/dL (ref 0.3–1.2)
Total Protein: 6.9 g/dL (ref 6.5–8.1)

## 2020-12-03 MED ORDER — SODIUM CHLORIDE 0.9 % IV SOLN: 100.0000 mL | INTRAVENOUS | Status: DC | PRN

## 2020-12-03 MED ORDER — HEPARIN SODIUM (PORCINE) 1000 UNIT/ML DIALYSIS: 1000.0000 [IU] | INTRAMUSCULAR | Status: DC | PRN

## 2020-12-03 MED ORDER — HEPARIN SODIUM (PORCINE) 1000 UNIT/ML DIALYSIS: 20.0000 [IU]/kg | INTRAMUSCULAR | Status: DC | PRN

## 2020-12-03 MED ORDER — CHLORHEXIDINE GLUCONATE CLOTH 2 % EX PADS: 6.0000 | MEDICATED_PAD | Freq: Every day | CUTANEOUS | Status: DC

## 2020-12-03 MED ORDER — LIDOCAINE-EPINEPHRINE (PF) 1 %-1:200000 IJ SOLN
20.0000 mL | Freq: Once | INTRAMUSCULAR | Status: AC
  Administered 2020-12-03: 20 mL via INTRADERMAL
  Filled 2020-12-03: qty 30

## 2020-12-03 MED ORDER — ALTEPLASE 2 MG IJ SOLR
2.0000 mg | Freq: Once | INTRAMUSCULAR | Status: DC | PRN
  Filled 2020-12-03: qty 2

## 2020-12-03 NOTE — ED Notes (Signed)
Called DR. Nikhil Teppara's office had him paged through on call service . Lattie Haw [phone]

## 2020-12-03 NOTE — ED Provider Notes (Signed)
11:20 PM Assumed care from Dr. Rogene Houston and Coral Ceo, please see their note for full history, physical and decision making until this point. In brief this is a 75 y.o. year old male who presented to the ED tonight with Vascular Access Problem     Getting dialysis for hyper-k. Can go home afterwards.   Reevaluated. Appears well. Ready to go home. Discharged.   Discharge instructions, including strict return precautions for new or worsening symptoms, given. Patient and/or family verbalized understanding and agreement with the plan as described.   Labs, studies and imaging reviewed by myself and considered in medical decision making if ordered. Imaging interpreted by radiology.  Labs Reviewed  CBC WITH DIFFERENTIAL/PLATELET - Abnormal; Notable for the following components:      Result Value   RBC 3.61 (*)    Hemoglobin 12.1 (*)    HCT 36.7 (*)    MCV 101.7 (*)    Neutro Abs 8.7 (*)    All other components within normal limits  COMPREHENSIVE METABOLIC PANEL - Abnormal; Notable for the following components:   Sodium 134 (*)    Potassium 5.5 (*)    CO2 21 (*)    Glucose, Bld 217 (*)    BUN 62 (*)    Creatinine, Ser 3.87 (*)    Albumin 3.4 (*)    GFR, Estimated 15 (*)    All other components within normal limits    CT ABDOMEN PELVIS WO CONTRAST  Final Result    DG Chest 2 View  Final Result      No follow-ups on file.    Bradley Black, Corene Cornea, MD 12/04/20 0230

## 2020-12-03 NOTE — ED Triage Notes (Signed)
Sent from dialysis for evaluation of placement CVC right chest. Concerned it may be dislodged.

## 2020-12-03 NOTE — Discharge Instructions (Addendum)
Please follow up with your primary care provider within 5-7 days for re-evaluation of your symptoms. If you do not have a primary care provider, information for a healthcare clinic has been provided for you to make arrangements for follow up care. Please return to the emergency department for any new or worsening symptoms. ° °

## 2020-12-03 NOTE — Progress Notes (Signed)
Called by [facility] ED staff regarding Bradley Black being sent from his outpatient HD unit, [facility] to evaluate his [facility] placement (sutures came off).  CXR confirmed appropriate placement and no dislodgement.  Unfortunately his outpatient unit could not fit him in today and K was elevated at 5.5.  We were asked to provide HD prior to discharge to home and he will follow up at his home unit on Tuesday.

## 2020-12-03 NOTE — ED Provider Notes (Signed)
[facility] EMERGENCY DEPARTMENT Provider Note   CSN: [id number] Arrival date & time: 12/03/20  1218     History Chief Complaint  Patient presents with  . Vascular Access Problem    Bradley Black is a 75 y.o. male.  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  Patient was at dialysis prior to arrival and the provider there was concerned that the patient's CVC catheter had become dislodged as one of the sutures has been pulled from the skin.  They recommended he be seen for evaluation in the emergency department.  Patient states that he is currently at his dry weight and they did not feel he needed to return for dialysis today.  He states that he did miss dialysis on Thursday due to not feeling well from some constipation.  He took some over-the-counter laxatives, was able to have a bowel movement and now constipation is resolved.  He denies any further abdominal pain, vomiting, fevers or other complaints.  He had a peritoneal dialysis catheter placed on 11/29/2019.    Pt started dialysis 4 months ago.   Past Medical History:  Diagnosis Date  . BPH (benign prostatic hyperplasia)   . CAD S/P percutaneous coronary angioplasty    a. PTCA of [place] b. PCI with BMS to LAD in 1997 c. RCA PCI [place] d. s/p CABG in 11/2011 with LIMA-LAD, SVG-PDA, SVG-OM2, and SVG-D1  . Chronic back pain   . CKD (chronic kidney disease), stage III ([place])   . Cyst of bursa    R shoulder  . Diabetic retinopathy ([place])   . DM (diabetes mellitus), type 2 with renal complications ([place])   . Essential hypertension   . Frequent PVCs   . GERD (gastroesophageal reflux disease)   . Ischemic cardiomyopathy 11/2011   Intra-OP TEE: EF 40-45%, no regional WMA; improved Anterior WM post  CABG.  . Left carotid artery stenosis   . Mixed hyperlipidemia   . 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 ESRD (end stage renal disease) ([place])   . Malnutrition of moderate degree 07/22/2020  . CKD (chronic kidney disease), stage V ([place]) 07/21/2020  . Dehydration 07/21/2020  . Hyponatremia 07/21/2020  . Prolonged QT interval 07/21/2020  . Generalized weakness 07/21/2020  . Tremors of nervous system 07/20/2020  . Hypocalcemia   . Myoclonic jerking 07/18/2020  . 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 Frequent PVCs   . 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[place])   . SOB (shortness of breath) 07/08/2020  . Elevated LFTs 07/08/2020  . Left carotid artery stenosis   . IDA (iron deficiency anemia) 04/13/2020  . GERD (gastroesophageal reflux disease)   . Essential hypertension 07/24/2019  . Anemia in chronic kidney disease 07/24/2019  . Chronic kidney disease, stage IV (severe) ([place]) 07/24/2019  . Hyperkalemia 07/24/2019  . 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 Type 2 diabetes mellitus with diabetic chronic kidney disease ([place]) 07/19/2016  . Cervicalgia 03/08/2014  . Whiplash injury to neck 03/08/2014  . Aortic heart murmur 10/18/2013  . Acute myocardial infarction, History  of:   . Hyperlipidemia LDL goal <70   . Anemia 04/23/2013  . 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 Ischemic cardiomyopathy 11/30/2011  . Actinic keratosis, left scalp 10/04/2011  . Seborrheic keratosis, right lateral leg 10/04/2011    Past Surgical History:  Procedure Laterality Date  . AV FISTULA PLACEMENT Left 07/28/2020   Procedure: LEFT ARM ARTERIOVENOUS (AV) FISTULA  CREATION;  Surgeon: Rosetta Posner, MD;  Location: [facility];  Service: Vascular;  Laterality: Left;  . BACK SURGERY  1970  . CARDIAC CATHETERIZATION   2013  . CORONARY ANGIOPLASTY  1994   OM  . CORONARY ANGIOPLASTY  1997   LAD  . CORONARY ANGIOPLASTY WITH STENT PLACEMENT  1999   RCA  . CORONARY ANGIOPLASTY WITH STENT PLACEMENT  2001   RCA  . CORONARY ARTERY BYPASS GRAFT  12/27/2011   Procedure: CORONARY ARTERY BYPASS GRAFTING (CABG);  Surgeon: Rexene Alberts, MD;  Location: [facility];  Service: Open Heart Surgery;  Laterality: N/A;  Times four. On pump. Using endoscopically harvested right greater saphenous vein and left internal mammary artery.   [name] HERNIA REPAIR    . INCISION AND DRAINAGE OF WOUND  2006   [place]  . INSERTION OF DIALYSIS CATHETER Right 07/25/2020   Procedure: INSERTION OF DIALYSIS CATHETER;  Surgeon: Virl Cagey, MD;  Location: [facility];  Service: General;  Laterality: Right;  . INTRAOPERATIVE TRANSESOPHAGEAL ECHOCARDIOGRAM  12/27/2011   Global hypokinesis with EF of 40-45%, improved LAD distribution wall motion.  [name] LEFT HEART CATHETERIZATION WITH CORONARY ANGIOGRAM N/A 12/13/2011   Procedure: LEFT HEART CATHETERIZATION WITH CORONARY ANGIOGRAM;  Surgeon: Leonie Man, MD;  Location: [facility] CATH LAB;  Service: Cardiovascular;  Laterality: N/A;  . LUMBAR FUSION    . MASS EXCISION  10/24/11   R arm  . RIGHT HEART CATH N/A 07/12/2020   Procedure: RIGHT HEART CATH;  Surgeon: Belva Crome, MD;  Location: [facility] CV LAB;  Service: Cardiovascular;  Laterality: N/A;  . TRANSESOPHAGEAL ECHOCARDIOGRAM  2013       Family History  Problem Relation Age of Onset  . Cancer Mother        breast  . Cancer Father        bone  . Randie cancer Neg Hx     Social History   Tobacco Use  . Smoking status: Former Smoker    Types: Cigarettes    Quit date: 10/01/1986    Years since quitting: 34.1  . Smokeless tobacco: Never Used  . Tobacco comment: quit about 30 yrs ago  Vaping Use  . Vaping Use: Never used  Substance Use Topics  . Alcohol use: No  . Drug use: No    Home Medications Prior to Admission medications    Medication Sig Start Date End Date Taking? Authorizing Provider  acetaminophen (TYLENOL) 500 MG tablet Take 1,000 mg by mouth every 6 (six) hours as needed for moderate pain or headache.   Yes [provider]  ALPRAZolam (XANAX) 0.5 MG tablet Take 1 tablet (0.5 mg total) by mouth at bedtime as needed for sleep. 09/08/20  Yes Susy Frizzle, MD  aspirin EC 81 MG tablet Take 81 mg by mouth daily.   Yes [provider]  calcitRIOL (ROCALTROL) 0.5 MCG capsule Take 1 capsule (0.5 mcg total) by mouth every Monday, Wednesday, and Friday. 07/20/20  Yes Barton Dubois, MD  hydrALAZINE (APRESOLINE) 50 MG  tablet Take 1.5 tablets (75 mg total) by mouth 3 (three) times daily. 11/16/20  Yes Strader, [place] M, PA-C  isosorbide mononitrate (IMDUR) 30 MG 24 hr tablet Take 1 tablet (30 mg total) by mouth daily. 07/14/20  Yes Swayze, Ava, DO  pantoprazole (PROTONIX) 40 MG tablet TAKE ONE TABLET BY MOUTH TWICE A DAY Patient taking differently: Take 40 mg by mouth 2 (two) times daily. 08/31/20  Yes Susy Frizzle, MD  amiodarone (PACERONE) 200 MG tablet Take 1 tablet (200 mg total) by mouth daily. Patient not taking: Reported on 12/03/2020 07/18/20   Thompson Grayer, MD  Desvenlafaxine Succinate ER (PRISTIQ) 25 MG TB24 Take 25 mg by mouth daily. Patient not taking: Reported on 12/03/2020 12/02/20   Susy Frizzle, MD    Allergies    No known allergies  Review of Systems   Review of Systems  Constitutional: Negative for fever.  HENT: Negative for ear pain and sore throat.   Eyes: Negative for visual disturbance.  Respiratory: Negative for cough and shortness of breath.   Cardiovascular: Negative for chest pain.  Gastrointestinal: Positive for constipation (resolved). Negative for abdominal pain, diarrhea, nausea and vomiting.  Genitourinary: Negative for dysuria and hematuria.  Musculoskeletal: Negative for back pain.  Skin: Negative for rash.  Neurological: Negative for headaches.  All  other systems reviewed and are negative.   Physical Exam Updated Vital Signs BP 132/60   Pulse 67   Temp 98.2 F (36.8 C) (Oral)   Resp 16   Ht 5\' 8"  (1.727 m)   Wt 65.5 kg   SpO2 100%   BMI 21.96 kg/m   Physical Exam Vitals and nursing note reviewed.  Constitutional:      Appearance: He is well-developed and well-nourished.  HENT:     Head: Normocephalic and atraumatic.  Eyes:     Conjunctiva/sclera: Conjunctivae normal.  Cardiovascular:     Rate and Rhythm: Normal rate and regular rhythm.     Heart sounds: No murmur heard.   Pulmonary:     Effort: Pulmonary effort is normal. No respiratory distress.     Breath sounds: Normal breath sounds. No wheezing, rhonchi or rales.     Comments: CVC catheter noted to right upper chest. Left suture is no longer attached to the skin.  Abdominal:     General: Bowel sounds are normal.     Palpations: Abdomen is soft.     Tenderness: There is no abdominal tenderness. There is no guarding or rebound.     Comments: Large dressing noted over the abdomen  Musculoskeletal:        General: No edema.     Cervical back: Neck supple.  Skin:    General: Skin is warm and dry.  Neurological:     Mental Status: He is alert.  Psychiatric:        Mood and Affect: Mood and affect normal.     ED Results / Procedures / Treatments   Labs (all labs ordered are listed, but only abnormal results are displayed) Labs Reviewed  CBC WITH DIFFERENTIAL/PLATELET - Abnormal; Notable for the following components:      Result Value   RBC 3.61 (*)    Hemoglobin 12.1 (*)    HCT 36.7 (*)    MCV 101.7 (*)    Neutro Abs 8.7 (*)    All other components within normal limits  COMPREHENSIVE METABOLIC PANEL - Abnormal; Notable for the following components:   Sodium 134 (*)    Potassium  5.5 (*)    CO2 21 (*)    Glucose, Bld 217 (*)    BUN 62 (*)    Creatinine, Ser 3.87 (*)    Albumin 3.4 (*)    GFR, Estimated 15 (*)    All other components within normal  limits    EKG EKG Interpretation  Date/Time:  Saturday December 03 2020 14:11:53 EST Ventricular Rate:  52 PR Interval:    QRS Duration: 135 QT Interval:  508 QTC Calculation: 473 R Axis:   104 Text Interpretation: Sinus rhythm Consider left ventricular hypertrophy Inferior infarct, old Anterior Q waves, possibly due to LVH Lateral leads are also involved Confirmed by Pattricia Boss [id number]) on 12/03/2020 2:14:12 PM   Radiology CT ABDOMEN PELVIS WO CONTRAST  Result Date: 12/03/2020 CLINICAL DATA:  75 year old male with abdominal and pelvic pain. Patient with end-stage renal disease with peritoneal dialysis catheter. EXAM: CT ABDOMEN AND PELVIS WITHOUT CONTRAST TECHNIQUE: Multidetector CT imaging of the abdomen and pelvis was performed following the standard protocol without IV contrast. COMPARISON:  07/11/2020 renal ultrasound. FINDINGS: Please note that parenchymal abnormalities may be missed without intravenous contrast. Lower chest: No acute abnormality.  Cardiomegaly identified. Hepatobiliary: The liver and gallbladder are unremarkable. No biliary dilatation. Pancreas: Atrophic without significant abnormality Spleen: Unremarkable Adrenals/Urinary Tract: The kidneys, adrenal glands and bladder are unremarkable except for stable LEFT renal cysts. Stomach/Bowel: No bowel wall thickening, bowel obstruction, bowel distension or bowel inflammatory changes. The appendix is normal. Vascular/Lymphatic: Aortic atherosclerosis. No enlarged abdominal or pelvic lymph nodes. Reproductive: Prostate enlargement is identified. Other: Peritoneal dialysis catheter is identified. There are scattered areas and foci of pneumoperitoneum within the mid and UPPER abdomen. There is gas noted along the peritoneal dialysis catheter and tracking along the LEFT abdominal wall musculature. No ascites, focal collection identified. Stranding in the anterior UPPER RIGHT abdominal fat is noted. Musculoskeletal: No acute or suspicious  bony abnormalities are identified. IMPRESSION: 1. Scattered pneumoperitoneum within the mid and UPPER abdomen with gas along the peritoneal dialysis catheter and tracking along the LEFT abdominal wall musculature. These findings are nonspecific but may be related to the patient's peritoneal dialysis as no bowel abnormalities are noted to suggest a bowel perforation. However, clinical correlation or follow-up recommended as infection or bowel perforation are not excluded. No ascites or focal collection. 2. Cardiomegaly. 3. Aortic Atherosclerosis (ICD10-I70.0). Electronically Signed   By: Margarette Canada M.D.   On: 12/03/2020 14:21   DG Chest 2 View  Result Date: 12/03/2020 CLINICAL DATA:  Line placement EXAM: CHEST - 2 VIEW COMPARISON:  July 25, 2020 FINDINGS: The cardiomediastinal silhouette is unchanged in contour.Status post median sternotomy and CABG. RIGHT IJ CVC tip terminates over the superior cavoatrial junction, unchanged. Tubing overlies the stomach, likely a peritoneal dialysis catheter, incompletely visualized. No pleural effusion. No pneumothorax. No acute pleuroparenchymal abnormality. Small amount of intra-abdominal air. Multilevel degenerative changes of the thoracic spine. IMPRESSION: 1. There is a small volume of pneumoperitoneum; per note dated 11/16/2020, patient was scheduled for a peritoneal dialysis catheter placement on 11/28/2020. This pneumoperitoneum may be secondary to peritoneal dialysis. If this has not been performed, then recommend dedicated abdominal CT for additional evaluation for pneumoperitoneum. 2. RIGHT chest CVC tip terminates over the superior cavoatrial junction. Electronically Signed   By: Valentino Saxon MD   On: 12/03/2020 13:11    Procedures Procedures   Medications Ordered in ED Medications  Chlorhexidine Gluconate Cloth 2 % PADS 6 each (has no administration in time range)  0.9 %  sodium chloride infusion (has no administration in time range)  0.9 %   sodium chloride infusion (has no administration in time range)  heparin injection 1,000 Units (has no administration in time range)  alteplase (CATHFLO ACTIVASE) injection 2 mg (has no administration in time range)  heparin injection 1,300 Units (has no administration in time range)  lidocaine-EPINEPHrine (XYLOCAINE-EPINEPHrine) 1 %-1:200000 (PF) injection 20 mL (20 mLs Intradermal Given 12/03/20 1528)    ED Course  I have reviewed the triage vital signs and the nursing notes.  Pertinent labs & imaging results that were available during my care of the patient were reviewed by me and considered in my medical decision making (see chart for details).    MDM Rules/Calculators/A&P                          75 year old male presenting for evaluation of his dialysis catheter.  Was seen at dialysis today and there was concern that the catheter was out of place so sent here for further evaluation.  He did not receive dialysis today and missed his last session.  He had a peritoneal dialysis catheter placed earlier this week with subsequent constipation noted that has since resolved.  He has no abdominal pain or GI symptoms at this time.  Reviewed/interpreted labs CBC shows no leukocytosis, anemia is present but appears stable from baseline CMP shows an elevated potassium at 5.5, his creatinine is elevated consistent with history of ESRD, LFTs within normal limits.  CXR -  1. There is a small volume of pneumoperitoneum; per note dated 11/16/2020, patient was scheduled for a peritoneal dialysis catheter placement on 11/28/2020. This pneumoperitoneum may be secondary to peritoneal dialysis. If this has not been performed, then recommend dedicated abdominal CT for additional evaluation for pneumoperitoneum. 2. RIGHT chest CVC tip terminates over the superior cavoatrial junction. CT abd/pelvis - : 1. Scattered pneumoperitoneum within the mid and UPPER abdomen with gas along the peritoneal dialysis catheter and  tracking along the LEFT abdominal wall musculature. These findings are nonspecific but may be related to the patient's peritoneal dialysis as no bowel abnormalities are noted to suggest a bowel perforation. However, clinical correlation or follow-up recommended as infection or bowel perforation are not excluded. No ascites or focal collection. 2. Cardiomegaly. 3. Aortic Atherosclerosis   1:40 PM CONSULT. Discussed case with Dr. Constance Haw who placed CVC catheter. She recommended placing silk suture to secure the catheter.   2:59 PM CONSULT with Amil Amen, APP with [facility]. He states that the findings on the CT abd/pelvis would be expected given patient is one week out from surgery and given that patient is clinically very well appearing without any abdominal TTP this is unlikely to represent any pathologic finding that would require any acute intervention.   3:20 PM CONSULT with Dr. Kathe Mariner with nephrology. He states he will page the dialysis nurse and have her come to the ED and dialyze the patient.   Pt seen in conjunction with Dr. Jeanell Sparrow who personally evaluated the patient and is in agreement with the plan.   At shift change, care transitioned to Dr. Dayna Barker with plan to d/c pt after dialysis.   Final Clinical Impression(s) / ED Diagnoses Final diagnoses:  Problem with vascular access  Hyperkalemia    Rx / DC Orders ED Discharge Orders    None       Rodney Booze, PA-C 12/03/20 2326    Ray,  Andee Poles, MD 12/05/20 1025

## 2020-12-04 NOTE — Procedures (Signed)
   HEMODIALYSIS TREATMENT NOTE:  3.5 hour low-heparin HD completed via RIJ TDC. Exit site is unremarkable. 2.4 liters removed.  UF rate was decreased twice in response to declining BP. All blood was returned.  Rockwell Alexandria, RN

## 2020-12-05 ENCOUNTER — Telehealth: Payer: Self-pay | Admitting: *Deleted

## 2020-12-05 NOTE — Telephone Encounter (Signed)
CSW attempted a phone outreach followup call to pt on 12/01/2020 and was unable to reach. CSW left a HIPPA compliant voice message and will await callback or try again 12/09/2020.   Eduard Clos MSW, LCSW Licensed Clinical Social Worker [facility] [phone]

## 2020-12-06 DIAGNOSIS — Z992 Dependence on renal dialysis: Secondary | ICD-10-CM | POA: Diagnosis not present

## 2020-12-06 DIAGNOSIS — N186 End stage renal disease: Secondary | ICD-10-CM | POA: Diagnosis not present

## 2020-12-08 DIAGNOSIS — Z992 Dependence on renal dialysis: Secondary | ICD-10-CM | POA: Diagnosis not present

## 2020-12-08 DIAGNOSIS — N186 End stage renal disease: Secondary | ICD-10-CM | POA: Diagnosis not present

## 2020-12-09 ENCOUNTER — Telehealth: Payer: Medicare Other

## 2020-12-10 DIAGNOSIS — N186 End stage renal disease: Secondary | ICD-10-CM | POA: Diagnosis not present

## 2020-12-10 DIAGNOSIS — Z992 Dependence on renal dialysis: Secondary | ICD-10-CM | POA: Diagnosis not present

## 2020-12-13 DIAGNOSIS — Z992 Dependence on renal dialysis: Secondary | ICD-10-CM | POA: Diagnosis not present

## 2020-12-13 DIAGNOSIS — N186 End stage renal disease: Secondary | ICD-10-CM | POA: Diagnosis not present

## 2020-12-13 DIAGNOSIS — N2581 Secondary hyperparathyroidism of renal origin: Secondary | ICD-10-CM | POA: Diagnosis not present

## 2020-12-14 DIAGNOSIS — I255 Ischemic cardiomyopathy: Secondary | ICD-10-CM | POA: Diagnosis not present

## 2020-12-14 DIAGNOSIS — N185 Chronic kidney disease, stage 5: Secondary | ICD-10-CM | POA: Diagnosis not present

## 2020-12-14 DIAGNOSIS — E1122 Type 2 diabetes mellitus with diabetic chronic kidney disease: Secondary | ICD-10-CM | POA: Diagnosis not present

## 2020-12-14 DIAGNOSIS — I2510 Atherosclerotic heart disease of native coronary artery without angina pectoris: Secondary | ICD-10-CM | POA: Diagnosis not present

## 2020-12-15 DIAGNOSIS — N186 End stage renal disease: Secondary | ICD-10-CM | POA: Diagnosis not present

## 2020-12-15 DIAGNOSIS — Z992 Dependence on renal dialysis: Secondary | ICD-10-CM | POA: Diagnosis not present

## 2020-12-17 DIAGNOSIS — N186 End stage renal disease: Secondary | ICD-10-CM | POA: Diagnosis not present

## 2020-12-17 DIAGNOSIS — Z992 Dependence on renal dialysis: Secondary | ICD-10-CM | POA: Diagnosis not present

## 2020-12-19 ENCOUNTER — Telehealth: Payer: Self-pay | Admitting: *Deleted

## 2020-12-19 ENCOUNTER — Telehealth: Payer: Medicare Other

## 2020-12-19 DIAGNOSIS — N186 End stage renal disease: Secondary | ICD-10-CM | POA: Diagnosis not present

## 2020-12-19 DIAGNOSIS — Z992 Dependence on renal dialysis: Secondary | ICD-10-CM | POA: Diagnosis not present

## 2020-12-19 NOTE — Telephone Encounter (Signed)
LATE ENTRY  CSW attempted to reach pt on 12/09/2020 and was unable. CSW will attempt outreach again this week, 12/23/2020.   Eduard Clos MSW, LCSW Licensed Clinical Social Worker [facility] [phone]

## 2020-12-19 NOTE — Telephone Encounter (Signed)
  Chronic Care Management   Outreach Note  12/19/2020 Name: Bradley Black MRN: [id number] DOB: [birth year]  Referred by: Susy Frizzle, MD Reason for referral : Chronic Care Management (HTN, DM)   An unsuccessful telephone outreach was attempted today. The patient was referred to the case management team for assistance with care management and care coordination.   Follow Up Plan: Telephone follow up appointment with care management team member scheduled for: care guide will outreach patient to schedule follow up telephone call. (message sent to care guide to reschedule).  Jacqlyn Larsen RNC, BSN RN Case Manager [facility] [phone]

## 2020-12-20 ENCOUNTER — Other Ambulatory Visit: Payer: Self-pay

## 2020-12-20 ENCOUNTER — Encounter: Payer: Self-pay | Admitting: Cardiology

## 2020-12-20 ENCOUNTER — Ambulatory Visit: Payer: Medicare Other | Admitting: Student

## 2020-12-20 ENCOUNTER — Ambulatory Visit: Payer: Medicare Other | Admitting: Cardiology

## 2020-12-20 VITALS — BP 202/76 | HR 68 | Ht 68.0 in | Wt 140.0 lb

## 2020-12-20 DIAGNOSIS — N186 End stage renal disease: Secondary | ICD-10-CM | POA: Diagnosis not present

## 2020-12-20 DIAGNOSIS — I5022 Chronic systolic (congestive) heart failure: Secondary | ICD-10-CM

## 2020-12-20 DIAGNOSIS — I10 Essential (primary) hypertension: Secondary | ICD-10-CM | POA: Diagnosis not present

## 2020-12-20 DIAGNOSIS — Z992 Dependence on renal dialysis: Secondary | ICD-10-CM | POA: Diagnosis not present

## 2020-12-20 DIAGNOSIS — I25810 Atherosclerosis of coronary artery bypass graft(s) without angina pectoris: Secondary | ICD-10-CM | POA: Diagnosis not present

## 2020-12-20 DIAGNOSIS — I6523 Occlusion and stenosis of bilateral carotid arteries: Secondary | ICD-10-CM

## 2020-12-20 MED ORDER — ENTRESTO 24-26 MG PO TABS: 1.0000 | ORAL_TABLET | Freq: Two times a day (BID) | ORAL | 3 refills | Status: DC

## 2020-12-20 MED ORDER — PRAVASTATIN SODIUM 20 MG PO TABS: 20.0000 mg | ORAL_TABLET | Freq: Every day | ORAL | 3 refills | Status: DC

## 2020-12-20 NOTE — Patient Instructions (Addendum)
Medication Instructions:  START Entresto 24-26mg  twice daily START Pravastatin 20 mg once daily *If you need a refill on your cardiac medications before your next appointment, please call your pharmacy*   Lab Work: None If you have labs (blood work) drawn today and your tests are completely normal, you will receive your results only by: [name] MyChart Message (if you have MyChart) OR . A paper copy in the mail If you have any lab test that is abnormal or we need to change your treatment, we will call you to review the results.   Testing/Procedures: None   Follow-Up: At [facility], you and your health needs are our priority.  As part of our continuing mission to provide you with exceptional heart care, we have created designated Provider Care Teams.  These Care Teams include your primary Cardiologist (physician) and Advanced Practice Providers (APPs -  Physician Assistants and Nurse Practitioners) who all work together to provide you with the care you need, when you need it.  We recommend signing up for the patient portal called "MyChart".  Sign up information is provided on this After Visit Summary.  MyChart is used to connect with patients for Virtual Visits (Telemedicine).  Patients are able to view lab/test results, encounter notes, upcoming appointments, etc.  Non-urgent messages can be sent to your provider as well.   To learn more about what you can do with MyChart, go to [email or web address].    Your next appointment:   6 week(s)  The format for your next appointment:   In Person  Provider:   You will see one of the following Advanced Practice Providers on your designated Care Team:    Bernerd Pho, PA-C   Ermalinda Barrios, [place]       Other Instructions Entresto 24-26 mg Lot#: [id number] EXP: 08/2022

## 2020-12-20 NOTE — Progress Notes (Signed)
Clinical Summary Bradley Black is a 75 y.o.male former patient of Dr Bronson Ing, this is our first visit together. He is seen for the following medical problems.   1. CAD - multiple prior interventions with subsequent CABG in 11/2011 with LIMA-LAD, SVG-PDA, SVG-OM2, and SVG-D1 - no recent chest pain.   2. Chronic combine systolic/diastolic HF -11/4578 echo: LVEF 45% - 07/2020 echo LVEF 25% - 11/2020 echo LVEF 35-40%, grade I dd, mild RV dysfunction.   - not on beta blocker due to bradycardia - no SOB or DOE, no LE edema. Makes small amount of urine - compliant with meds, unclear what happened to his statin  3. PVCs Recent monitor last year showed an 18% PVC burden and he was ultimately started on Amiodarone 200mg  daily  - no recent palpitations.   4. Carotid stenosis 05/2020 RICA mild, left moderate disease  5. ESRD  - started dialysis about 3 month ago - has peritoneal catheter, in training to start at home but has not yet.      Past Medical History:  Diagnosis Date  . BPH (benign prostatic hyperplasia)   . CAD S/P percutaneous coronary angioplasty    a. PTCA of [place] b. PCI with BMS to LAD in 1997 c. RCA PCI [place] d. s/p CABG in 11/2011 with LIMA-LAD, SVG-PDA, SVG-OM2, and SVG-D1  . Chronic back pain   . CKD (chronic kidney disease), stage III ([place])   . Cyst of bursa    R shoulder  . Diabetic retinopathy ([place])   . DM (diabetes mellitus), type 2 with renal complications ([place])   . Essential hypertension   . Frequent PVCs   . GERD (gastroesophageal reflux disease)   . Ischemic cardiomyopathy 11/2011   Intra-OP TEE: EF 40-45%, no regional WMA; improved Anterior WM post CABG.  . Left carotid artery stenosis   . Mixed hyperlipidemia   . 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 No Known Allergies      Current Outpatient Medications  Medication  Sig Dispense Refill  . acetaminophen (TYLENOL) 500 MG tablet Take 1,000 mg by mouth every 6 (six) hours as needed for moderate pain or headache.    . ALPRAZolam (XANAX) 0.5 MG tablet Take 1 tablet (0.5 mg total) by mouth at bedtime as needed for sleep. 30 tablet 0  . aspirin EC 81 MG tablet Take 81 mg by mouth daily.    . calcitRIOL (ROCALTROL) 0.5 MCG capsule Take 1 capsule (0.5 mcg total) by mouth every Monday, Wednesday, and Friday. 45 capsule 1  . hydrALAZINE (APRESOLINE) 50 MG tablet Take 1.5 tablets (75 mg total) by mouth 3 (three) times daily. 270 tablet 5  . isosorbide mononitrate (IMDUR) 30 MG 24 hr tablet Take 1 tablet (30 mg total) by mouth daily. 30 tablet 0  . pantoprazole (PROTONIX) 40 MG tablet TAKE ONE TABLET BY MOUTH TWICE A DAY (Patient taking differently: Take 40 mg by mouth 2 (two) times daily.) 180 tablet 3   No current facility-administered medications for this visit.     Past Surgical History:  Procedure Laterality Date  . AV FISTULA PLACEMENT Left 07/28/2020   Procedure: LEFT ARM ARTERIOVENOUS (AV) FISTULA  CREATION;  Surgeon: Rosetta Posner, MD;  Location: [facility];  Service: Vascular;  Laterality: Left;  . BACK  SURGERY  1970  . CARDIAC CATHETERIZATION  2013  . CORONARY ANGIOPLASTY  1994   OM  . CORONARY ANGIOPLASTY  1997   LAD  . CORONARY ANGIOPLASTY WITH STENT PLACEMENT  1999   RCA  . CORONARY ANGIOPLASTY WITH STENT PLACEMENT  2001   RCA  . CORONARY ARTERY BYPASS GRAFT  12/27/2011   Procedure: CORONARY ARTERY BYPASS GRAFTING (CABG);  Surgeon: Rexene Alberts, MD;  Location: [facility];  Service: Open Heart Surgery;  Laterality: N/A;  Times four. On pump. Using endoscopically harvested right greater saphenous vein and left internal mammary artery.   [name] HERNIA REPAIR    . INCISION AND DRAINAGE OF WOUND  2006   [place]  . INSERTION OF DIALYSIS CATHETER Right 07/25/2020   Procedure: INSERTION OF DIALYSIS CATHETER;  Surgeon: Virl Cagey, MD;  Location: [facility];   Service: General;  Laterality: Right;  . INTRAOPERATIVE TRANSESOPHAGEAL ECHOCARDIOGRAM  12/27/2011   Global hypokinesis with EF of 40-45%, improved LAD distribution wall motion.  [name] LEFT HEART CATHETERIZATION WITH CORONARY ANGIOGRAM N/A 12/13/2011   Procedure: LEFT HEART CATHETERIZATION WITH CORONARY ANGIOGRAM;  Surgeon: Leonie Man, MD;  Location[facility];  Service: Cardiovascular;  Laterality: N/A;  . LUMBAR FUSION    . MASS EXCISION  10/24/11   R arm  . RIGHT HEART CATH N/A 07/12/2020   Procedure: RIGHT HEART CATH;  Surgeon: Belva Crome, MD;  Location: [place] CV LAB;  Service: Cardiovascular;  Laterality: N/A;  . TRANSESOPHAGEAL ECHOCARDIOGRAM  2013     Allergies  Allergen Reactions  . No Known Allergies       Family History  Problem Relation Age of Onset  . Cancer Mother        breast  . Cancer Father        bone  . Sung cancer Neg Hx      Social History Mr. Dollar reports that he quit smoking about 34 years ago. His smoking use included cigarettes. He has never used smokeless tobacco. Mr. Crichlow reports no history of alcohol use.   Review of Systems CONSTITUTIONAL: No weight loss, fever, chills, weakness or fatigue.  HEENT: Eyes: No visual loss, blurred vision, double vision or yellow sclerae.No hearing loss, sneezing, congestion, runny nose or sore throat.  SKIN: No rash or itching.  CARDIOVASCULAR: per hpi RESPIRATORY: No shortness of breath, cough or sputum.  GASTROINTESTINAL: No anorexia, nausea, vomiting or diarrhea. No abdominal pain or blood.  GENITOURINARY: No burning on urination, no polyuria NEUROLOGICAL: No headache, dizziness, syncope, paralysis, ataxia, numbness or tingling in the extremities. No change in bowel or bladder control.  MUSCULOSKELETAL: No muscle, back pain, joint pain or stiffness.  LYMPHATICS: No enlarged nodes. No history of splenectomy.  PSYCHIATRIC: No history of depression or anxiety.  ENDOCRINOLOGIC: No reports of  sweating, cold or heat intolerance. No polyuria or polydipsia.  [name]   Physical Examination Today's Vitals   12/20/20 0907  BP: (!) 202/76  Pulse: 68  SpO2: 97%  Weight: 140 lb (63.5 kg)  Height: 5\' 8"  (1.727 m)   Body mass index is 21.29 kg/m.  Gen: resting comfortably, no acute distress HEENT: no scleral icterus, pupils equal round and reactive, no palptable cervical adenopathy,  CV: RRR, no m/r/gl no jvd Resp: Clear to auscultation bilaterally GI: abdomen is soft, non-tender, non-distended, normal bowel sounds, no hepatosplenomegaly MSK: extremities are warm, no edema.  Skin: warm, no rash Neuro:  no focal deficits Psych: appropriate affect  Assessment and Plan  1. CAD - no symptoms, continue current meds - should be on statin in setting of CAD even though LDL in 60s at baseline, start pravastatin 20mg  daily.   2. Chronic systolic HF - LVEF is improving but not back to normal - he is now committed to dialysis, start entresto 24/26mg  bid - titrate entresto at f/u, consider aldactone in the near future - no beta blocker due to chronic bradycardia  3. Carotid stenosis - mild to moderate disease, repeat US later this year. COntinue medical therapy.  4. HTN - significantly elevated, starting entresto. Titrate at f/u   6 week f/u. Would look to titrate entresto further at f/u.     Arnoldo Lenis, M.D

## 2020-12-21 ENCOUNTER — Telehealth: Payer: Self-pay | Admitting: *Deleted

## 2020-12-21 ENCOUNTER — Telehealth: Payer: Medicare Other

## 2020-12-21 DIAGNOSIS — Z992 Dependence on renal dialysis: Secondary | ICD-10-CM | POA: Diagnosis not present

## 2020-12-21 DIAGNOSIS — N186 End stage renal disease: Secondary | ICD-10-CM | POA: Diagnosis not present

## 2020-12-21 NOTE — Telephone Encounter (Signed)
  Chronic Care Management   Outreach Note  12/21/2020 Name: Bradley Black MRN: [id number] DOB: Feb 08, 1946  Referred by: Susy Frizzle, MD Reason for referral : Chronic Care Management (CCM LCSW unsuccessful follow up call re: mental health )   A second unsuccessful telephone outreach was attempted today. The patient was referred to the case management team for assistance with care management and care coordination.   Follow Up Plan: The care management team will reach out to the patient again over the next 7 days.  If patient returns call to provider office, please advise to call Embedded Care Management Care Guide or LCSW at [phone].  Eduard Clos MSW, LCSW Licensed Clinical Social Worker [facility] [phone]

## 2020-12-22 DIAGNOSIS — Z992 Dependence on renal dialysis: Secondary | ICD-10-CM | POA: Diagnosis not present

## 2020-12-22 DIAGNOSIS — N186 End stage renal disease: Secondary | ICD-10-CM | POA: Diagnosis not present

## 2020-12-23 ENCOUNTER — Telehealth: Payer: Self-pay | Admitting: *Deleted

## 2020-12-23 ENCOUNTER — Ambulatory Visit (INDEPENDENT_AMBULATORY_CARE_PROVIDER_SITE_OTHER): Payer: Medicare Other | Admitting: *Deleted

## 2020-12-23 DIAGNOSIS — N184 Chronic kidney disease, stage 4 (severe): Secondary | ICD-10-CM | POA: Diagnosis not present

## 2020-12-23 DIAGNOSIS — Z992 Dependence on renal dialysis: Secondary | ICD-10-CM

## 2020-12-23 DIAGNOSIS — N186 End stage renal disease: Secondary | ICD-10-CM | POA: Diagnosis not present

## 2020-12-23 DIAGNOSIS — F339 Major depressive disorder, recurrent, unspecified: Secondary | ICD-10-CM | POA: Diagnosis not present

## 2020-12-23 NOTE — Chronic Care Management (AMB) (Signed)
Chronic Care Management    Clinical Social Work Note  12/23/2020 Name: Bradley Black MRN: [id number] DOB: 10/10/1945  Bradley Black is a 75 y.o. year old male who is a primary care patient of Pickard, [name], MD. The CCM team was consulted to assist the patient with chronic disease management and/or care coordination needs related to: Mental Health Counseling and Resources.  Engaged with patient by telephone for follow up visit in response to provider referral for social work chronic care management and care coordination services.  Consent to Services:  The patient was given information about Chronic Care Management services, agreed to services, and gave verbal consent prior to initiation of services.  Please see initial visit note for detailed documentation.  Patient agreed to services and consent obtained.  Assessment: Review of patient past medical history, allergies, medications, and health status, including review of relevant consultants reports was performed today as part of a comprehensive evaluation and provision of chronic care management and care coordination services.    SDOH (Social Determinants of Health) assessments and interventions performed:   Advanced Directives Status: Not addressed in this encounter. CCM Care Plan  Allergies  Allergen Reactions  . No Known Allergies     Outpatient Encounter Medications as of 12/23/2020  Medication Sig  . acetaminophen (TYLENOL) 500 MG tablet Take 1,000 mg by mouth every 6 (six) hours as needed for moderate pain or headache.  . ALPRAZolam (XANAX) 0.5 MG tablet Take 1 tablet (0.5 mg total) by mouth at bedtime as needed for sleep.  [name] amiodarone (PACERONE) 200 MG tablet Take 200 mg by mouth daily.  [name] aspirin EC 81 MG tablet Take 81 mg by mouth daily.  . calcitRIOL (ROCALTROL) 0.5 MCG capsule Take 1 capsule (0.5 mcg total) by mouth every Monday, Wednesday, and Friday.  . hydrALAZINE (APRESOLINE) 50 MG tablet Take 1.5 tablets (75 mg  total) by mouth 3 (three) times daily.  . isosorbide mononitrate (IMDUR) 30 MG 24 hr tablet Take 1 tablet (30 mg total) by mouth daily.  . pantoprazole (PROTONIX) 40 MG tablet TAKE ONE TABLET BY MOUTH TWICE A DAY (Patient taking differently: Take 40 mg by mouth 2 (two) times daily.)  . pravastatin (PRAVACHOL) 20 MG tablet Take 1 tablet (20 mg total) by mouth daily.  . sacubitril-valsartan (ENTRESTO) 24-26 MG Take 1 tablet by mouth 2 (two) times daily.  . [DISCONTINUED] Desvenlafaxine Succinate ER (PRISTIQ) 25 MG TB24 Take 25 mg by mouth daily. (Patient not taking: Reported on 12/03/2020)   No facility-administered encounter medications on file as of 12/23/2020.    Patient Active Problem List   Diagnosis Date Noted  . ESRD (end stage renal disease) ([place])   . Malnutrition of moderate degree 07/22/2020  . CKD (chronic kidney disease), stage V ([place]) 07/21/2020  . Dehydration 07/21/2020  . Hyponatremia 07/21/2020  . Prolonged QT interval 07/21/2020  . Generalized weakness 07/21/2020  . Tremors of nervous system 07/20/2020  . Hypocalcemia   . Myoclonic jerking 07/18/2020  . Biventricular heart failure ([place])   . Frequent PVCs   . 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[place])   . SOB (shortness of breath) 07/08/2020  . Elevated LFTs 07/08/2020  . Left carotid artery stenosis   . IDA (iron deficiency anemia) 04/13/2020  . GERD (gastroesophageal reflux disease)   . Essential hypertension 07/24/2019  . Anemia in chronic kidney disease 07/24/2019  . Chronic kidney disease, stage IV (severe) ([place]) 07/24/2019  . Hyperkalemia 07/24/2019  . 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 Type 2 diabetes mellitus with diabetic chronic kidney disease ([place]) 07/19/2016  . Cervicalgia 03/08/2014  . Whiplash injury to neck 03/08/2014  . Aortic heart murmur 10/18/2013  . Acute myocardial infarction, History of:   [name] Hyperlipidemia LDL goal <70   . Anemia 04/23/2013  . S/P CABG x 4  12/27/2011  . 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 Ischemic cardiomyopathy 11/30/2011  . Actinic keratosis, left scalp 10/04/2011  . Seborrheic keratosis, right lateral leg 10/04/2011    Conditions to be addressed/monitored: Depression; Mental Health Concerns   Care Plan : LCSW Plan of Care  Updates made by Deirdre Peer, LCSW since 12/23/2020 12:00 AM    Problem: Knowledge barriers related to management of depression and new dialysis treatment   Priority: High    Long-Range Goal: Improve managment of symptoms of depression   Start Date: 11/24/2020  Expected End Date: 02/28/2021  This Visit's Progress: On track  Recent Progress: On track  Priority: High  Note:   Current Barriers:  . Pt reports he has been taking the new regimen of RX prescribed by PCP for his depression for a few weeks and he thinks he can see some improvement "at times".  CSW reminded pt he can expect to continue to see his system adjusting and balancing out with the meds for a new normal and hopeful improvements.  . Pt is anxious to begin in-home dialysis and hopes this will allow him more time and will help him to feel better (less symptoms from HD as well as mentally)  . Acute Mental Health needs related to depression related to coping with new health issues requiring dialysis.  . Mental Health Concerns  . Suicidal Ideation/Homicidal Ideation: No Pt does report he has had "thoughts" in the past; has no plan and has contact #'s for emergency/crisis interventions . Pt does not with to pursue behavioral health counseling at this time; stating he does not have time with his busy work schedule and HD.  He may reconsider at a later time. Clinical Social Work [employer]):  . patient will work with SW  as needed and as scheduled  by telephone to reduce or manage symptoms related to depression . Over the next 3 weeks, patient will continue taking medications as prescribed by PCP for depression and report  any significant concerns/issues to PCP. [name] Pt will reach out for emergent assistance if symptoms worsen and/or if pt becomes SI/HI.  Interventions: . Patient interviewed and appropriate assessments performed: brief mental health assessment . Provided patient with information about referral to behavior health counseling/therapy services available . Discussed plans with patient for ongoing care management follow up and provided patient with direct contact information for care management team . Collaborated with primary care provider re: depression and need to reassess and consider alternative anti-depressant options for pt to try . Motivational Interviewing . Solution-Focused Strategies . Brief CBT . Emotional/Supportive Counseling . Psychotropic Medication Adherence Assessment . Psychoeducation and/or Health Education . Problem Solving . Teaching/Coaching Strategies  . Crisis Doctor, hospital provided options Patient Coping Strengths:  . Supportive Relationships . Family . Hopefulness . Able to Communicate Effectively . Pt works part-time Probation officer to local stores Patient Self Care Deficits:  . Unable to independently manage symptoms related to depression . Does not adhere to prescribed medication regimen Patient Goals:  -continue with new medication regimen at prescribed by PCP - avoid negative self-talk -  develop a personal safety plan - develop a plan to deal with triggers  - exercise at least 2 to 3 times per week - have a plan for how to handle bad days - journal feelings and what helps to feel better or worse - spend time or talk with others at least 2 to 3 times per week - watch for early signs of feeling sad/depressed and write in journal when this occurs (what, when, how, why, etc)    Follow Up Plan: SW will follow up with patient by phone 01/09/2021 for updates on his progress with the RX changes and further needs.      Follow Up Plan: Appointment scheduled  for SW follow up with client by phone on: 01/09/2021      Eduard Clos MSW, [place] Licensed Clinical Social Worker [place] [phone]

## 2020-12-23 NOTE — Patient Instructions (Signed)
Visit Information  PATIENT GOALS: Goals Addressed            This Visit's Progress   . Reduce feelings of depression       Timeframe:  Long-Range Goal Priority:  High Start Date:        11/24/2020                     Expected End Date:   02/28/2021                    Follow Up Date 01/09/2021 -continue with new medication regimen at prescribed by PCP - avoid negative self-talk - develop a personal safety plan - develop a plan to deal with triggers  - exercise at least 2 to 3 times per week - have a plan for how to handle bad days - journal feelings and what helps to feel better or worse - spend time or talk with others at least 2 to 3 times per week - watch for early signs of feeling sad/depressed and write in journal when this occurs (what, when, how, why,, etc)      Why is this important?    Keeping track of your progress will help your treatment team find the right mix of medicine and therapy for you.   Write in your journal every day.   Day-to-day changes in depression symptoms are normal. It may be more helpful to check your progress at the end of each week instead of every day.     Notes:        The patient verbalized understanding of instructions, educational materials, and care plan provided today and declined offer to receive copy of patient instructions, educational materials, and care plan.   Telephone follow up appointment with care management team member scheduled for: 01/09/2021  Eduard Clos MSW, LCSW Licensed Clinical Social Worker [facility] [phone]

## 2020-12-23 NOTE — Chronic Care Management (AMB) (Signed)
  Care Management   Note  12/23/2020 Name: [name] MRN: [id number] DOB: [birth year]  Bradley Black is a 75 y.o. year old male who is a primary care patient of Dennard Schaumann, [name], MD and is actively engaged with the care management team. I reached out to [place] by phone today to assist with re-scheduling a follow up visit with the RN Case Manager  Follow up plan: Unsuccessful telephone outreach attempt made. A HIPAA compliant phone message was left for the patient providing contact information and requesting a return call.  The care management team will reach out to the patient again over the next 7 days.  If patient returns call to provider office, please advise to call [place] at [phone].  [place] Management

## 2020-12-26 DIAGNOSIS — N186 End stage renal disease: Secondary | ICD-10-CM | POA: Diagnosis not present

## 2020-12-26 DIAGNOSIS — Z992 Dependence on renal dialysis: Secondary | ICD-10-CM | POA: Diagnosis not present

## 2020-12-27 DIAGNOSIS — Z992 Dependence on renal dialysis: Secondary | ICD-10-CM | POA: Diagnosis not present

## 2020-12-27 DIAGNOSIS — N186 End stage renal disease: Secondary | ICD-10-CM | POA: Diagnosis not present

## 2020-12-28 ENCOUNTER — Telehealth: Payer: Medicare Other

## 2020-12-28 DIAGNOSIS — Z992 Dependence on renal dialysis: Secondary | ICD-10-CM | POA: Diagnosis not present

## 2020-12-28 DIAGNOSIS — N186 End stage renal disease: Secondary | ICD-10-CM | POA: Diagnosis not present

## 2020-12-29 DIAGNOSIS — Z992 Dependence on renal dialysis: Secondary | ICD-10-CM | POA: Diagnosis not present

## 2020-12-29 DIAGNOSIS — N186 End stage renal disease: Secondary | ICD-10-CM | POA: Diagnosis not present

## 2020-12-29 NOTE — Chronic Care Management (AMB) (Signed)
  Care Management   Note  12/29/2020 Name: Bradley Black MRN: [id number] DOB: Dec 23, 1945  Bradley Black is a 75 y.o. year old male who is a primary care patient of Dennard Schaumann, [name], MD and is actively engaged with the care management team. I reached out to [place] by phone today to assist with re-scheduling a follow up visit with the RN Case Manager  Follow up plan: A second unsuccessful telephone outreach attempt made. A HIPAA compliant phone message was left for the patient providing contact information and requesting a return call.  The care management team will reach out to the patient again over the next 7 days.  If patient returns call to provider office, please advise to call [place] at [phone].  [place] Management

## 2020-12-30 DIAGNOSIS — Z992 Dependence on renal dialysis: Secondary | ICD-10-CM | POA: Diagnosis not present

## 2020-12-30 DIAGNOSIS — N186 End stage renal disease: Secondary | ICD-10-CM | POA: Diagnosis not present

## 2020-12-31 DIAGNOSIS — N186 End stage renal disease: Secondary | ICD-10-CM | POA: Diagnosis not present

## 2020-12-31 DIAGNOSIS — Z992 Dependence on renal dialysis: Secondary | ICD-10-CM | POA: Diagnosis not present

## 2021-01-01 DIAGNOSIS — N186 End stage renal disease: Secondary | ICD-10-CM | POA: Diagnosis not present

## 2021-01-01 DIAGNOSIS — Z992 Dependence on renal dialysis: Secondary | ICD-10-CM | POA: Diagnosis not present

## 2021-01-02 DIAGNOSIS — N186 End stage renal disease: Secondary | ICD-10-CM | POA: Diagnosis not present

## 2021-01-02 DIAGNOSIS — Z992 Dependence on renal dialysis: Secondary | ICD-10-CM | POA: Diagnosis not present

## 2021-01-02 NOTE — Chronic Care Management (AMB) (Signed)
  Care Management   Note  01/02/2021 Name: Bradley Black MRN: [id number] DOB: [birth year]  Bradley Black is a 75 y.o. year old male who is a primary care patient of Dennard Schaumann, [name], MD and is actively engaged with the care management team. I reached out to [place] by phone today to assist with re-scheduling a follow up visit with the RN Case Manager  Follow up plan: Telephone appointment with care management team member scheduled for:01/16/2021  Charlisa Cham  Care Guide, Embedded Care Coordination [place]  Care Management

## 2021-01-03 DIAGNOSIS — Z992 Dependence on renal dialysis: Secondary | ICD-10-CM | POA: Diagnosis not present

## 2021-01-03 DIAGNOSIS — N186 End stage renal disease: Secondary | ICD-10-CM | POA: Diagnosis not present

## 2021-01-04 DIAGNOSIS — Z992 Dependence on renal dialysis: Secondary | ICD-10-CM | POA: Diagnosis not present

## 2021-01-04 DIAGNOSIS — N186 End stage renal disease: Secondary | ICD-10-CM | POA: Diagnosis not present

## 2021-01-05 DIAGNOSIS — N186 End stage renal disease: Secondary | ICD-10-CM | POA: Diagnosis not present

## 2021-01-05 DIAGNOSIS — Z992 Dependence on renal dialysis: Secondary | ICD-10-CM | POA: Diagnosis not present

## 2021-01-06 DIAGNOSIS — E119 Type 2 diabetes mellitus without complications: Secondary | ICD-10-CM | POA: Diagnosis not present

## 2021-01-06 DIAGNOSIS — Z992 Dependence on renal dialysis: Secondary | ICD-10-CM | POA: Diagnosis not present

## 2021-01-06 DIAGNOSIS — N186 End stage renal disease: Secondary | ICD-10-CM | POA: Diagnosis not present

## 2021-01-07 DIAGNOSIS — Z992 Dependence on renal dialysis: Secondary | ICD-10-CM | POA: Diagnosis not present

## 2021-01-07 DIAGNOSIS — N186 End stage renal disease: Secondary | ICD-10-CM | POA: Diagnosis not present

## 2021-01-08 DIAGNOSIS — N186 End stage renal disease: Secondary | ICD-10-CM | POA: Diagnosis not present

## 2021-01-08 DIAGNOSIS — Z992 Dependence on renal dialysis: Secondary | ICD-10-CM | POA: Diagnosis not present

## 2021-01-09 ENCOUNTER — Ambulatory Visit (INDEPENDENT_AMBULATORY_CARE_PROVIDER_SITE_OTHER): Payer: Medicare Other | Admitting: *Deleted

## 2021-01-09 DIAGNOSIS — Z992 Dependence on renal dialysis: Secondary | ICD-10-CM | POA: Diagnosis not present

## 2021-01-09 DIAGNOSIS — N186 End stage renal disease: Secondary | ICD-10-CM | POA: Diagnosis not present

## 2021-01-09 DIAGNOSIS — F339 Major depressive disorder, recurrent, unspecified: Secondary | ICD-10-CM

## 2021-01-10 ENCOUNTER — Telehealth: Payer: Self-pay | Admitting: *Deleted

## 2021-01-10 DIAGNOSIS — N186 End stage renal disease: Secondary | ICD-10-CM | POA: Diagnosis not present

## 2021-01-10 DIAGNOSIS — Z992 Dependence on renal dialysis: Secondary | ICD-10-CM | POA: Diagnosis not present

## 2021-01-10 NOTE — Patient Instructions (Signed)
Visit Information  PATIENT GOALS: Goals Addressed            This Visit's Progress   . Reduce feelings of depression       Timeframe:  Long-Range Goal Priority:  High Start Date:        11/24/2020                     Expected End Date:   02/28/2021                    Follow Up Date 02/07/2021 -continue with new medication regimen at prescribed by PCP -expect call from Pharmacist to discuss medication costs/assistance - avoid negative self-talk - develop a personal safety plan - develop a plan to deal with triggers  - exercise at least 2 to 3 times per week - have a plan for how to handle bad days - journal feelings and what helps to feel better or worse - spend time or talk with others at least 2 to 3 times per week - watch for early signs of feeling sad/depressed and write in journal when this occurs (what, when, how, why, etc)     Why is this important?    Keeping track of your progress will help your treatment team find the right mix of medicine and therapy for you.   Write in your journal every day.   Day-to-day changes in depression symptoms are normal. It may be more helpful to check your progress at the end of each week instead of every day.     Notes:        The patient verbalized understanding of instructions, educational materials, and care plan provided today and declined offer to receive copy of patient instructions, educational materials, and care plan.   Telephone follow up appointment with care management team member scheduled for:02/07/2021  Eduard Clos MSW, [place] Licensed Clinical Social Worker [facility] [phone]

## 2021-01-10 NOTE — Chronic Care Management (AMB) (Signed)
Chronic Care Management    Clinical Social Work Note  01/10/2021 Name: Bradley Black MRN: [id number] DOB: [birth year] Bradley Black is a 75 y.o. year old male who is a primary care patient of Pickard, [name], MD. The CCM team was consulted to assist the patient with chronic disease management and/or care coordination needs related to: [name] and Resources and Financial Difficulties related to medical expenses/RX cost.  Engaged with patient by telephone for follow up visit in response to provider referral for social work chronic care management and care coordination services.  Consent to Services:  The patient was given information about Chronic Care Management services, agreed to services, and gave verbal consent prior to initiation of services.  Please see initial visit note for detailed documentation.  Patient agreed to services and consent obtained.  Assessment: Review of patient past medical history, allergies, medications, and health status, including review of relevant consultants reports was performed today as part of a comprehensive evaluation and provision of chronic care management and care coordination services.    SDOH (Social Determinants of Health) assessments and interventions performed:  SDOH Interventions   Flowsheet Row Most Recent Value  SDOH Interventions   Depression Interventions/Treatment  Medication      Advanced Directives Status: Not addressed in this encounter. CCM Care Plan  Allergies  Allergen Reactions  . No Known Allergies     Outpatient Encounter Medications as of 01/09/2021  Medication Sig  . acetaminophen (TYLENOL) 500 MG tablet Take 1,000 mg by mouth every 6 (six) hours as needed for moderate pain or headache.  . ALPRAZolam (XANAX) 0.5 MG tablet Take 1 tablet (0.5 mg total) by mouth at bedtime as needed for sleep.  [name] amiodarone (PACERONE) 200 MG tablet Take 200 mg by mouth daily.  [name] aspirin EC 81 MG tablet Take 81 mg by mouth  daily.  . calcitRIOL (ROCALTROL) 0.5 MCG capsule Take 1 capsule (0.5 mcg total) by mouth every Monday, Wednesday, and Friday.  . hydrALAZINE (APRESOLINE) 50 MG tablet Take 1.5 tablets (75 mg total) by mouth 3 (three) times daily.  . isosorbide mononitrate (IMDUR) 30 MG 24 hr tablet Take 1 tablet (30 mg total) by mouth daily.  . pantoprazole (PROTONIX) 40 MG tablet TAKE ONE TABLET BY MOUTH TWICE A DAY (Patient taking differently: Take 40 mg by mouth 2 (two) times daily.)  . pravastatin (PRAVACHOL) 20 MG tablet Take 1 tablet (20 mg total) by mouth daily.  . sacubitril-valsartan (ENTRESTO) 24-26 MG Take 1 tablet by mouth 2 (two) times daily.  . [DISCONTINUED] Desvenlafaxine Succinate ER (PRISTIQ) 25 MG TB24 Take 25 mg by mouth daily. (Patient not taking: Reported on 12/03/2020)   No facility-administered encounter medications on file as of 01/09/2021.    Patient Active Problem List   Diagnosis Date Noted  . ESRD (end stage renal disease) ([place])   . Malnutrition of moderate degree 07/22/2020  . CKD (chronic kidney disease), stage V ([place]) 07/21/2020  . Dehydration 07/21/2020  . Hyponatremia 07/21/2020  . Prolonged QT interval 07/21/2020  . Generalized weakness 07/21/2020  . Tremors of nervous system 07/20/2020  . Hypocalcemia   . Myoclonic jerking 07/18/2020  . 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 Frequent PVCs   . 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[place])   . SOB (shortness of breath) 07/08/2020  . Elevated LFTs 07/08/2020  . Left carotid artery stenosis   . IDA (iron deficiency anemia) 04/13/2020  . GERD (gastroesophageal reflux disease)   .  Essential hypertension 07/24/2019  . Anemia in chronic kidney disease 07/24/2019  . Chronic kidney disease, stage IV (severe) ([place]) 07/24/2019  . Hyperkalemia 07/24/2019  . 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 Type 2 diabetes mellitus with diabetic chronic kidney disease ([name]) 07/19/2016  . Cervicalgia 03/08/2014  .  Whiplash injury to neck 03/08/2014  . Aortic heart murmur 10/18/2013  . Acute myocardial infarction, History of:   [name] Hyperlipidemia LDL goal <70   . Anemia 04/23/2013  . 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 Ischemic cardiomyopathy 11/30/2011  . Actinic keratosis, left scalp 10/04/2011  . Seborrheic keratosis, right lateral leg 10/04/2011    Conditions to be addressed/monitored: ESRD and Depression; Mental Health Concerns   Care Plan : LCSW Plan of Care  Updates made by Deirdre Peer, LCSW since 01/10/2021 12:00 AM    Problem: Knowledge barriers related to management of depression and new dialysis treatment   Priority: High    Long-Range Goal: Improve managment of symptoms of depression   Start Date: 11/24/2020  Expected End Date: 02/28/2021  This Visit's Progress: On track  Recent Progress: On track  Priority: High  Note:   Current Barriers:  . Pt reports he has been taking the new regimen of RX prescribed by PCP for his depression and is tolerating it well. It also feels it is helping with his symptoms related to depression and denies any negative side effects as previous.  CSW reminded pt he can continue to expect to see his body system adjusting and balancing out with the meds for a new normal and hopeful improvements.  . Pt has begun in-home dialysis (about one week ago) and aside from some minor issues with the machine he is adjusting well to this and is pleased he can do this from home.  . Acute Mental Health needs related to depression; primarily related to coping with new health issues requiring dialysis.  . Suicidal Ideation/Homicidal Ideation: No Pt does report he has had "thoughts" in the past; has no plan and has contact #'s for emergency/crisis interventions . Pt does not with to pursue behavioral health counseling at this time; stating he does not feel he needs it. . Pt shared with CSW they are facing some difficulty with  finances due to medical expenses.  CSW will ask Pharmacy to contact pt to further assess possible support/assistance that may be available. Pt reports he was offered "Medicare extra help" when in the hospital but never followed up on the paperwork after dc for RX assistance. Clinical Social Work [employer]):  . patient will work with SW  as needed and as scheduled  by telephone to reduce or manage symptoms related to depression . Over the next 3 weeks, patient will continue taking medications as prescribed by PCP for depression and report any significant concerns/issues to PCP. [name] Pt will reach out for emergent assistance if symptoms worsen and/or if pt becomes SI/HI.  Interventions: CSW spoke with pt by phone who reports he has been able to perform his dialysis from home for about one week and it is going well. Pt reports it is easier; mentally, physically and overall on him to do it this way. He reports feeling better; still low energy and "not able to keep up with my wife walking in [facility]" but is pleased with his progress. CSW completed the PHQ2/9 Depression Screening with pt and he scored an "  8" placing him in the mild for severity category.  He denies any SI/HI and reports not having any suicidal thoughts "in a good while".  CSW discussed with pt the importance of allowing his whole body to "accept" the new needs of his body which include the dialysis, anti-depressant and perhaps walking slower, doing less/less energy, and other.... By acceptance he can then begin to begin his "new normal" and find joy and contentment, focusing on the positives (I can walk in [place] with my wife)  and seeking new opportunities/activities.  CSW encouraged pt to talk with his PCP and Renal team about his energy concerns as well as other issues to seek advice, alternatives and possible solutions.  . Patient interviewed and appropriate assessments performed: brief mental health assessment . PHQ 2 . PHQ 9 . Provided  patient with information about referral to behavior health counseling/therapy services available . Discussed plans with patient for ongoing care management follow up and provided patient with direct contact information for care management team . Collaborated with primary care provider re: depression and need to reassess and consider alternative anti-depressant options for pt to try . Motivational Interviewing . Solution-Focused Strategies . Brief CBT . Emotional/Supportive Counseling . Psychotropic Medication Adherence Assessment . Psychoeducation and/or Health Education . Problem Solving . Teaching/Coaching Strategies  . Crisis Doctor, hospital provided options Patient Coping Strengths:  . Supportive Relationships . Family . Hopefulness . Able to Communicate Effectively . Pt works part-time Probation officer to local stores Patient Self Care Deficits:  . Unable to independently manage symptoms related to depression . Does not adhere to prescribed medication regimen Patient Goals:  -continue with new medication regimen at prescribed by PCP -expect call from Pharmacist to discuss medication costs/assistance - avoid negative self-talk - develop a personal safety plan - develop a plan to deal with triggers  - exercise at least 2 to 3 times per week - have a plan for how to handle bad days - journal feelings and what helps to feel better or worse - spend time or talk with others at least 2 to 3 times per week - watch for early signs of feeling sad/depressed and write in journal when this occurs (what, when, how, why, etc)    Follow Up Plan: SW will follow up with patient by phone 02/07/2021 for updates on his progress and further needs.      Follow Up Plan: Appointment scheduled for SW follow up with client by phone on: 02/07/2021     Eduard Clos MSW, [place] Licensed Clinical Social Worker [place] [phone]

## 2021-01-10 NOTE — Telephone Encounter (Signed)
Received call from patient.   Reports that he was recently seen by nephrology. States that they had previously stopped Januvia due to kidney function, but now feel that he would benefit from therapy.   Last prescription noted at [facility] 25mg  PO Q D.   Please advise.

## 2021-01-11 DIAGNOSIS — N186 End stage renal disease: Secondary | ICD-10-CM | POA: Diagnosis not present

## 2021-01-11 DIAGNOSIS — Z992 Dependence on renal dialysis: Secondary | ICD-10-CM | POA: Diagnosis not present

## 2021-01-12 ENCOUNTER — Telehealth: Payer: Self-pay | Admitting: Family Medicine

## 2021-01-12 DIAGNOSIS — Z992 Dependence on renal dialysis: Secondary | ICD-10-CM | POA: Diagnosis not present

## 2021-01-12 DIAGNOSIS — N186 End stage renal disease: Secondary | ICD-10-CM | POA: Diagnosis not present

## 2021-01-12 NOTE — Telephone Encounter (Signed)
Patient left a voicemail to follow up on request for a call back from [facility] [place] [facility].

## 2021-01-12 NOTE — Telephone Encounter (Signed)
Last a1c was 5.7 in October.  I would want to repeat that before starting therapy to avoid hypoglycemia.

## 2021-01-12 NOTE — Telephone Encounter (Signed)
Please see prior message.   

## 2021-01-12 NOTE — Telephone Encounter (Signed)
Call placed to patient and patient made aware.   Reports that he is currently on dialysis. States that dialysis center checks his levels with every draw and noticed a trend upwards. Advised that we will obtain readings and review prior to changing medication. Verbalized understanding.   [facility]~ [place]  [phone]~ telephone.

## 2021-01-13 DIAGNOSIS — Z992 Dependence on renal dialysis: Secondary | ICD-10-CM | POA: Diagnosis not present

## 2021-01-13 DIAGNOSIS — N186 End stage renal disease: Secondary | ICD-10-CM | POA: Diagnosis not present

## 2021-01-14 DIAGNOSIS — N186 End stage renal disease: Secondary | ICD-10-CM | POA: Diagnosis not present

## 2021-01-14 DIAGNOSIS — Z992 Dependence on renal dialysis: Secondary | ICD-10-CM | POA: Diagnosis not present

## 2021-01-15 DIAGNOSIS — N186 End stage renal disease: Secondary | ICD-10-CM | POA: Diagnosis not present

## 2021-01-15 DIAGNOSIS — Z992 Dependence on renal dialysis: Secondary | ICD-10-CM | POA: Diagnosis not present

## 2021-01-16 ENCOUNTER — Ambulatory Visit: Payer: Medicare Other | Admitting: *Deleted

## 2021-01-16 DIAGNOSIS — N186 End stage renal disease: Secondary | ICD-10-CM

## 2021-01-16 DIAGNOSIS — F339 Major depressive disorder, recurrent, unspecified: Secondary | ICD-10-CM | POA: Diagnosis not present

## 2021-01-16 DIAGNOSIS — I10 Essential (primary) hypertension: Secondary | ICD-10-CM | POA: Diagnosis not present

## 2021-01-16 DIAGNOSIS — Z992 Dependence on renal dialysis: Secondary | ICD-10-CM | POA: Diagnosis not present

## 2021-01-16 NOTE — Patient Instructions (Signed)
Visit Information  PATIENT GOALS: Goals Addressed              This Visit's Progress   .  Follow My Treatment Plan to facilitate improved coping-Chronic Kidney/ESRD        Timeframe:  Long-Range Goal Priority:  High Start Date:   11/14/2020                          Expected End Date:   05/14/2021                    Follow Up Date 02/20/2021   - call the doctor or nurse before I stop taking medicine - call the doctor or nurse to get help with side effects - keep follow-up appointments Bradley Black 02/01/2021   - talk with your doctor about any worsening depression - continue working with Education officer, museum - call dialysis center or your doctor if you have any problems or issues with peritoneal dialysis - follow renal diet   - please follow renal/ diabetic diet as well as fluid restrictions Why is this important?    Staying as healthy as you can is very important. This may mean making changes if you smoke, don't exercise or eat poorly.   A healthy lifestyle is an important goal for you.   Following the treatment plan and making changes may be hard.   Try some of these steps to help keep the disease from getting worse.        .  Track and Manage My Blood Pressure (pt-stated)        Timeframe:  Long-Range Goal Priority:  High Start Date:   11/14/2020                          Expected End Date:   05/14/2021                    Follow Up Date 02/20/2021  Continue to check blood pressure daily and before and after peritoneal dialysis, keep a log and take to primary care provider appointment Be mindful of salt intake, avoid salty snacks and fast food Follow up with primary care provider and specialists as scheduled, Bradley Black 02/01/2021 Take all medications as prescribed Call your doctor if blood pressure readings are elevated, systolic  (top YSAYTK)>160, diastolic (bottom number) >10   Why is this important?   You won't feel high blood pressure, but it can still hurt your  blood vessels.  High blood pressure can cause heart or kidney problems. It can also cause a stroke.  Making lifestyle changes like losing a little weight or eating less salt will help.  Checking your blood pressure at home and at different times of the day can help to control blood pressure.  If the doctor prescribes medicine remember to take it the way the doctor ordered.  Call the office if you cannot afford the medicine or if there are questions about it.            The patient verbalized understanding of instructions, educational materials, and care plan provided today and agreed to receive a mailed copy of patient instructions, educational materials, and care plan.   Telephone follow up appointment with care management team member scheduled for:  02/20/2021  Jacqlyn Larsen [facility], BSN RN Case Manager [place] [phone]   Peritoneal Dialysis Information Peritoneal dialysis is a procedure  that filters your blood. You may have this procedure if your kidneys are not working well. You can perform peritoneal dialysis yourself, or a machine can do it for you at night when you sleep.  Tell a health care provider about:  Any allergies you have.  All medicines you are taking, including vitamins, herbs, eye drops, creams, and over-the-counter medicines.  Any problems you or family members have had with anesthetic medicines.  Any blood disorders you have.  Any surgeries you have.  Any medical conditions you have.  Whether you are pregnant or may be pregnant. What are the risks? Generally, this is a safe procedure. However, problems may occur, including:  Infection in the lining of your abdomen (peritoneum). This is the most common problem.  Infection in the area where the catheter was inserted.  Discomfort in the area where the catheter was inserted.  Weakened abdominal muscles. This may lead to a hernia, which can cause problems if left untreated. What  happens before the procedure? It is important to safely prepare for treatment and take steps to prevent infection. Your health care provider will teach you how to prepare for a dialysis session. Preparation may involve:  Putting on a mask.  Closing doors and windows in the room where dialysis will be performed.  Making sure to wash your hands before and during treatment. Anyone who touches you or the equipment should also wash his or her hands often.  Making sure that tubing and equipment are germ-free (sterile).  Checking the bag of fluid (dialysate) you will use during the session, to make sure it is sealed and free of germs (uncontaminated). What happens during the procedure? At the start of a session, your abdomen is filled with dialysate. The dialysate pulls waste, salt, and extra water through the peritoneum. At the end of the session, the dialysate, plus all the waste it pulled from your blood, is drained from your body. There are two kinds of peritoneal dialysis. You may be treated using:  Continuous cycling peritoneal dialysis (CCPD). In this type, a machine called a cycler performs an exchange for you by filling and draining your abdomen while you sleep.  Continuous ambulatory peritoneal dialysis (CAPD). In this type, you perform exchanges for yourself up to 5 times a day. Each exchange takes about 30-40 minutes. The amount of time the dialysate stays in your body (the dwell) usually varies from 1.5-3 hours. You may go about your day normally between exchanges. Some people may need to have both CAPD and CCPD.   What can I expect after procedure?  You may need to have lab work or other tests done to check on how well the dialysis is working.  Change the bandage (dressing) around your permanent catheter as directed by your health care provider. Keep the dressing clean and dry.  Weigh yourself after the treatment and write down your weight. Follow these instructions at home: Eating  and drinking Follow your health care provider's instructions about diet. You should follow a diet plan that includes:  Nutritional counseling with a dietitian.  Vitamin supplements.  High-quality proteins, such as meat, poultry, fish, and eggs. Most people on peritoneal dialysis need to eat a high-protein diet because protein is lost during the dialysis exchange. Preventing constipation Avoid becoming constipated. Constipation prevents dialysate from draining well. To prevent constipation:  Eat foods that are high in fiber, such as beans, whole grains, and fresh fruits and vegetables.  Limit foods that are high in fat and  processed sugars, such as fried or sweet foods.  Be active.  Go to the restroom when you feel that you need to. Do not hold it in.  Take over-the-counter or prescription medicines, such as laxatives, only if your health care provider recommends them. General instructions  Keep a strict schedule. Dialysis must be done every day. Do not skip a day or an exchange. Make time for each exchange.  Always keep the dialysate bags and other supplies in a cool, clean, and dry place.  Take over-the-counter and prescription medicines only as told by your health care provider.  Weigh yourself every day. Sudden weight gain may be a sign of a problem.  Keep all follow-up visits. This is important. Where to find more information You can find more information about peritoneal dialysis treatment from:  [facility]: [employer]  National Kidney Foundation: www.kidney.org Contact a health care provider if:  You have a fever or chills.  You feel nauseous or you vomit.  You have diarrhea.  You have any problems with an exchange.  Your blood pressure increases.  You suddenly gain weight or feel short of breath.  The catheter seems loose or feels like it is coming out.  The fluid that has drained from your  abdomen is pinkish or reddish. Women having their menstrual period do not need to seek medical care if the fluid is only a little pink or a little red.  There are white strands in the dialysate that are large enough to get stuck in your tubing or catheter. Get help right away if:  The area around the catheter swells or becomes red, tender, or painful.  There is pus coming from the catheter area.  The fluid that has drained from your abdomen is cloudy.  You feel more abdominal pain or discomfort. Summary  Peritoneal dialysis is a procedure that filters your blood. You may have this procedure if your kidneys are not working well.  CAPD and CCPD are the two kinds of peritoneal dialysis. Your health care provider will help you decide which kind is best for you.  The main risks of peritoneal dialysis are infection and weakened abdominal muscles, which may lead to a hernia. This information is not intended to replace advice given to you by your health care provider. Make sure you discuss any questions you have with your health care provider. Document Revised: 05/05/2020 Document Reviewed: 05/05/2020 Elsevier Patient Education  2021 [employer].

## 2021-01-16 NOTE — Chronic Care Management (AMB) (Signed)
Chronic Care Management   CCM RN Visit Note  01/16/2021 Name: Bradley Black MRN: [id number] DOB: [birth year]  Subjective: Bradley Black is a 75 y.o. year old male who is a primary care patient of Pickard, [name], MD. The care management team was consulted for assistance with disease management and care coordination needs.    Engaged with patient by telephone for telephone follow up in response to provider referral for case management and/or care coordination services.   Consent to Services:  The patient was given information about Chronic Care Management services, agreed to services, and gave verbal consent prior to initiation of services.  Please see initial visit note for detailed documentation.   Patient agreed to services and verbal consent obtained.   Assessment: Review of patient past medical history, allergies, medications, health status, including review of consultants reports, laboratory and other test data, was performed as part of comprehensive evaluation and provision of chronic care management services.   SDOH (Social Determinants of Health) assessments and interventions performed:    CCM Care Plan  Allergies  Allergen Reactions  . No Known Allergies     Outpatient Encounter Medications as of 01/16/2021  Medication Sig  . acetaminophen (TYLENOL) 500 MG tablet Take 1,000 mg by mouth every 6 (six) hours as needed for moderate pain or headache.  . ALPRAZolam (XANAX) 0.5 MG tablet Take 1 tablet (0.5 mg total) by mouth at bedtime as needed for sleep.  [name] amiodarone (PACERONE) 200 MG tablet Take 200 mg by mouth daily.  [name] aspirin EC 81 MG tablet Take 81 mg by mouth daily.  . calcitRIOL (ROCALTROL) 0.5 MCG capsule Take 1 capsule (0.5 mcg total) by mouth every Monday, Wednesday, and Friday.  . hydrALAZINE (APRESOLINE) 50 MG tablet Take 1.5 tablets (75 mg total) by mouth 3 (three) times daily.  . isosorbide mononitrate (IMDUR) 30 MG 24 hr tablet Take 1 tablet (30 mg total) by  mouth daily.  . pantoprazole (PROTONIX) 40 MG tablet TAKE ONE TABLET BY MOUTH TWICE A DAY (Patient taking differently: Take 40 mg by mouth 2 (two) times daily.)  . pravastatin (PRAVACHOL) 20 MG tablet Take 1 tablet (20 mg total) by mouth daily.  . sacubitril-valsartan (ENTRESTO) 24-26 MG Take 1 tablet by mouth 2 (two) times daily.  . [DISCONTINUED] Desvenlafaxine Succinate ER (PRISTIQ) 25 MG TB24 Take 25 mg by mouth daily. (Patient not taking: Reported on 12/03/2020)   No facility-administered encounter medications on file as of 01/16/2021.    Patient Active Problem List   Diagnosis Date Noted  . ESRD (end stage renal disease) ([place])   . Malnutrition of moderate degree 07/22/2020  . CKD (chronic kidney disease), stage V ([place]) 07/21/2020  . Dehydration 07/21/2020  . Hyponatremia 07/21/2020  . Prolonged QT interval 07/21/2020  . Generalized weakness 07/21/2020  . Tremors of nervous system 07/20/2020  . Hypocalcemia   . Myoclonic jerking 07/18/2020  . Biventricular heart failure ([place])   . Frequent PVCs   . 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[place])   . SOB (shortness of breath) 07/08/2020  . Elevated LFTs 07/08/2020  . Left carotid artery stenosis   . IDA (iron deficiency anemia) 04/13/2020  . GERD (gastroesophageal reflux disease)   . Essential hypertension 07/24/2019  . Anemia in chronic kidney disease 07/24/2019  . Chronic kidney disease, stage IV (severe) ([place]) 07/24/2019  . Hyperkalemia 07/24/2019  . 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 Type 2 diabetes mellitus with diabetic chronic  kidney disease ([place]) 07/19/2016  . Cervicalgia 03/08/2014  . Whiplash injury to neck 03/08/2014  . Aortic heart murmur 10/18/2013  . Acute myocardial infarction, History of:   [name] Hyperlipidemia LDL goal <70   . Anemia 04/23/2013  . 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 Ischemic cardiomyopathy 11/30/2011  . Actinic  keratosis, left scalp 10/04/2011  . Seborrheic keratosis, right lateral leg 10/04/2011    Conditions to be addressed/monitored:HTN and ESRD  Care Plan : Chronic Kidney (Adult)  Updates made by Kassie Mends, RN since 01/16/2021 12:00 AM    Problem: Adjustment to Chronic Kidney Disease     Long-Range Goal: Patient will acheive Optimal Coping related to CKD/ ESRD (dialysis)   Start Date: 11/14/2020  Expected End Date: 05/13/2021  This Visit's Progress: On track  Recent Progress: On track  Priority: High  Note:   Current Barriers:   Ineffective Self Health Maintenance- patient having difficulty coping with CKD/ ESRD with dialysis and coming to terms with living with CKD/ ESRD, patient is working with embedded Education officer, museum.  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  Knowledge Deficits related to short term plan for care coordination needs and long term plans for chronic disease management needs- CBG ranges 132-152 fasting Clinical Goal(s):  [name] Collaboration with Susy Frizzle, MD regarding development and update of comprehensive plan of care as evidenced by provider attestation and co-signature . Inter-disciplinary care team collaboration (see longitudinal plan of care)  Over the next 180 days, patient will work with care management team to address care coordination and chronic disease management needs related to:  Mental Health Counseling  Adherence to renal diet and fluid restrictions  Adherence to prescribed medication regimen  Adherence to peritoneal dialysis  regimen Interventions:   Evaluation of current treatment plan related to CKD/ ESRD with dialysis self-management and patient's adherence to plan as established by provider.  Collaboration with Susy Frizzle, MD regarding development and update of comprehensive plan of care as evidenced by provider attestation       and co-signature  Inter-disciplinary care team collaboration (see longitudinal plan of care)  Discussed plans with patient for ongoing care management follow up and provided patient with direct contact information for care management team  Mailed patient education "peritoneal dialysis"  Reviewed importance of adherence to diabetes diet,  renal diet and fluid restrictions  Reviewed importance of following up with all heatlh care providers and reviewed upcoming appointments - Stormy Fabian 02/01/2021.  Reviewed medications and importance of adherence  In basket message sent to primary care provider inquiring if patient is supposed to start [place] again (per pt request as nephrologist mentioned to pt) Patient Goals/Self Care Activities:  - call the doctor or nurse before I stop taking medicine - call the doctor or nurse to get help with side effects - keep follow-up appointments Stormy Fabian 02/01/2021   - talk with your doctor about any worsening depression - continue working with Education officer, museum - call dialysis center or your doctor if you have any problems or issues with peritoneal dialysis - follow  renal diet Follow Up Plan: Telephone follow up appointment with care management team member scheduled for:  02/20/2021    Care Plan : Hypertension (Adult)  Updates made by Kassie Mends, RN since 01/16/2021 12:00 AM    Problem: Difficulty managing hypertension   Priority: High    Long-Range Goal: Hypertension Monitored   Start Date: 11/14/2020  Expected End Date: 05/13/2021  This Visit's Progress: Not on track  Recent Progress: On track  Priority: High  Note:    Objective:  . Last practice recorded BP readings:  BP Readings from Last 3 Encounters:  10/04/20 (!) 164/88  09/08/20 120/68  08/29/20 117/68 .   [name] Most recent eGFR/CrCl: No results found for: EGFR  No components found for: CRCL Current Barriers:  [name] Knowledge Deficits related to basic understanding of hypertension pathophysiology and self care management aeb by elevated blood pressure readings . Patient is now checking blood pressure daily (also before and after peritoneal dialysis) and continues to have some elevated readings with most recent readings 161/65 and 164/65 today, 156/67, 146/61 and 196/87. . Does try to follow low sodium diet. . Does not adhere to provider recommendations re:  calling primary care doctor for health issues - has not reported elevated blood pressure recently to primary care provider (pt states nephrologist aware) (office closed for holiday and pt did not call back). Case Manager Clinical Goal(s):  [name]  Patient will verbalize understanding of plan for hypertension management as evidenced by taking all medications as prescribed, monitoring and recording blood pressure as directed and adhering to low sodium diet. . Patient will contact primary care provider for elevated readings Interventions:  . Collaboration with Susy Frizzle, MD regarding development and update of comprehensive plan of care as evidenced by provider attestation and co-signature . Inter-disciplinary care team collaboration (see longitudinal plan of care) . Evaluation of current treatment plan related to hypertension self management and patient's adherence to plan as established by provider. . Reviewed medications with patient and discussed importance of compliance . Discussed plans with patient for ongoing care management follow up and provided patient with direct contact information for care management team . Advised patient, providing education and rationale, to monitor blood pressure daily and  record, check before and after peritoneal dialysis, call PCP for findings outside established parameters.  . Reviewed scheduled/upcoming provider appointments including:  Stormy Fabian 02/01/2021. [name] RN care manager sent in basket to primary care provider reporting patient reports taking hydralazine and entresto and continues to have elevated BP readings. Patient Goals/Self-Care Activities . Continue to check blood pressure daily and before and after peritoneal dialysis, keep a log and take to primary care provider appointment . Be mindful of salt intake, avoid salty snacks and fast food . Follow up with primary care provider and specialists as scheduled, Stormy Fabian 02/01/2021 . Take all medications as prescribed . Call your doctor if blood pressure readings are elevated, systolic  (top SFKCLE)>751, diastolic (bottom number) >70 Follow Up Plan: Telephone follow up appointment with care management team member scheduled for:  02/20/2021     Plan:Telephone follow up appointment with care management team member scheduled for:  02/20/2021  Jacqlyn Larsen [facility], BSN RN Case Manager [facility] [phone]

## 2021-01-16 NOTE — Telephone Encounter (Signed)
Call placed to [facility] in [place]- [phone]~ telephone/ [phone]~ fax. Reports that random glucose levels are obtained, but no A1C.   Call placed to patient. Advised to schedule OV for A1C. Will route BP readings to cardiology.   PCP to be made aware.

## 2021-01-16 NOTE — Telephone Encounter (Signed)
Patient left another message; still waiting for call back from nurse. Please advise at [phone]

## 2021-01-16 NOTE — Telephone Encounter (Signed)
Received the following message: I spoke with pt today who reports nephrologist wanted him to ask if he should go back on januvia 25 mg daily, pt said he talked with someone at your office, then I think you all were closed for the holiday [facility] your name was on the note so I included you),  Recent CBG 137, 152, 144, 132 fasting. Can you please advise patient.   BP is elevated 161/65 and 164/65 today and recent readings 156/67, 146/61, 196/87, he is on hydralazine and entresto, wanted you to be aware, I asked him to contact you if continue to have elevated readings, he is keeping a log.   Thanks so much,   Jacqlyn Larsen RNC, BSN  RN Case Manager  [facility]  [phone]

## 2021-01-16 NOTE — Telephone Encounter (Signed)
Patient has appointment scheduled.

## 2021-01-17 DIAGNOSIS — Z992 Dependence on renal dialysis: Secondary | ICD-10-CM | POA: Diagnosis not present

## 2021-01-17 DIAGNOSIS — N186 End stage renal disease: Secondary | ICD-10-CM | POA: Diagnosis not present

## 2021-01-18 DIAGNOSIS — N186 End stage renal disease: Secondary | ICD-10-CM | POA: Diagnosis not present

## 2021-01-18 DIAGNOSIS — Z992 Dependence on renal dialysis: Secondary | ICD-10-CM | POA: Diagnosis not present

## 2021-01-19 DIAGNOSIS — Z452 Encounter for adjustment and management of vascular access device: Secondary | ICD-10-CM | POA: Diagnosis not present

## 2021-01-19 DIAGNOSIS — N186 End stage renal disease: Secondary | ICD-10-CM | POA: Diagnosis not present

## 2021-01-19 DIAGNOSIS — Z992 Dependence on renal dialysis: Secondary | ICD-10-CM | POA: Diagnosis not present

## 2021-01-20 ENCOUNTER — Ambulatory Visit (INDEPENDENT_AMBULATORY_CARE_PROVIDER_SITE_OTHER): Payer: Medicare Other | Admitting: Family Medicine

## 2021-01-20 ENCOUNTER — Encounter: Payer: Self-pay | Admitting: Family Medicine

## 2021-01-20 ENCOUNTER — Telehealth: Payer: Self-pay | Admitting: *Deleted

## 2021-01-20 ENCOUNTER — Other Ambulatory Visit: Payer: Self-pay

## 2021-01-20 VITALS — BP 160/68 | HR 72 | Temp 98.9°F | Resp 14 | Ht 68.0 in | Wt 144.0 lb

## 2021-01-20 DIAGNOSIS — N186 End stage renal disease: Secondary | ICD-10-CM

## 2021-01-20 DIAGNOSIS — I10 Essential (primary) hypertension: Secondary | ICD-10-CM | POA: Diagnosis not present

## 2021-01-20 DIAGNOSIS — Z992 Dependence on renal dialysis: Secondary | ICD-10-CM | POA: Diagnosis not present

## 2021-01-20 DIAGNOSIS — E1169 Type 2 diabetes mellitus with other specified complication: Secondary | ICD-10-CM

## 2021-01-20 MED ORDER — CLONIDINE HCL 0.1 MG PO TABS: 0.1000 mg | ORAL_TABLET | Freq: Two times a day (BID) | ORAL | 3 refills | Status: DC

## 2021-01-20 NOTE — Progress Notes (Signed)
Subjective:    Patient ID: Bradley Black, male    DOB: Jul 07, 1946, 75 y.o.   MRN: [id number]  Patient states that his depression is doing better.  Whether it is the Pristiq 25 mg a day that he is taking or whether it is the fact that he switched his dialysis routine from 3 days a week to every night at home is uncertain.  I believe that the latter is playing a larger role in his improved outlook.  Regardless he states that he feels much better now.  However 2 issues, his blood sugars have been elevated recently had dialysis.  1 day they were 186, the next day was 187.  The time before that it was 287.  His most recent hemoglobin A1c was 7.2.  This is up from 5.7 last fall.  He denies any hypoglycemic episodes.  He did quit Januvia.  His blood pressures also been high.  His systolic blood pressure has been over 160.  He continues to have bradycardia however with heart rates below 60. Past Medical History:  Diagnosis Date  . BPH (benign prostatic hyperplasia)   . CAD S/P percutaneous coronary angioplasty    a. PTCA of [place] b. PCI with BMS to LAD in 1997 c. RCA PCI [place] d. s/p CABG in 11/2011 with LIMA-LAD, SVG-PDA, SVG-OM2, and SVG-D1  . Chronic back pain   . CKD (chronic kidney disease), stage III ([place])   . Cyst of bursa    R shoulder  . Diabetic retinopathy ([place])   . DM (diabetes mellitus), type 2 with renal complications ([place])   . Essential hypertension   . Frequent PVCs   . GERD (gastroesophageal reflux disease)   . Ischemic cardiomyopathy 11/2011   Intra-OP TEE: EF 40-45%, no regional WMA; improved Anterior WM post CABG.  . Left carotid artery stenosis   . Mixed hyperlipidemia   . 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 AV FISTULA PLACEMENT Left 07/28/2020   Procedure: LEFT ARM ARTERIOVENOUS (AV) FISTULA  CREATION;  Surgeon: Rosetta Posner, MD;   Location: [facility];  Service: Vascular;  Laterality: Left;  . BACK SURGERY  1970  . CARDIAC CATHETERIZATION  2013  . CORONARY ANGIOPLASTY  1994   OM  . CORONARY ANGIOPLASTY  1997   LAD  . CORONARY ANGIOPLASTY WITH STENT PLACEMENT  1999   RCA  . CORONARY ANGIOPLASTY WITH STENT PLACEMENT  2001   RCA  . CORONARY ARTERY BYPASS GRAFT  12/27/2011   Procedure: CORONARY ARTERY BYPASS GRAFTING (CABG);  Surgeon: Rexene Alberts, MD;  Location: [facility];  Service: Open Heart Surgery;  Laterality: N/A;  Times four. On pump. Using endoscopically harvested right greater saphenous vein and left internal mammary artery.   [name] HERNIA REPAIR    . INCISION AND DRAINAGE OF WOUND  2006   [place]  . INSERTION OF DIALYSIS CATHETER Right 07/25/2020   Procedure: INSERTION OF DIALYSIS CATHETER;  Surgeon: Virl Cagey, MD;  Location: [facility];  Service: General;  Laterality: Right;  . INTRAOPERATIVE TRANSESOPHAGEAL ECHOCARDIOGRAM  12/27/2011   Global hypokinesis with EF of 40-45%, improved LAD distribution wall motion.  [name] LEFT HEART CATHETERIZATION WITH CORONARY ANGIOGRAM N/A 12/13/2011   Procedure: LEFT HEART CATHETERIZATION WITH CORONARY ANGIOGRAM;  Surgeon: Leonie Man, MD;  Location: [facility] CATH LAB;  Service: Cardiovascular;  Laterality: N/A;  . LUMBAR FUSION    . MASS EXCISION  10/24/11   R arm  . RIGHT HEART CATH N/A 07/12/2020   Procedure: RIGHT HEART CATH;  Surgeon: Belva Crome, MD;  Location: [facility] CV LAB;  Service: Cardiovascular;  Laterality: N/A;  . TRANSESOPHAGEAL ECHOCARDIOGRAM  2013   Current Outpatient Medications on File Prior to Visit  Medication Sig Dispense Refill  . acetaminophen (TYLENOL) 500 MG tablet Take 1,000 mg by mouth every 6 (six) hours as needed for moderate pain or headache.    . ALPRAZolam (XANAX) 0.5 MG tablet Take 1 tablet (0.5 mg total) by mouth at bedtime as needed for sleep. 30 tablet 0  . amiodarone (PACERONE) 200 MG tablet Take 200 mg by mouth daily.    [name] aspirin EC  81 MG tablet Take 81 mg by mouth daily.    . calcitRIOL (ROCALTROL) 0.5 MCG capsule Take 1 capsule (0.5 mcg total) by mouth every Monday, Wednesday, and Friday. 45 capsule 1  . desvenlafaxine (PRISTIQ) 50 MG 24 hr tablet Take 25 mg by mouth daily.    . hydrALAZINE (APRESOLINE) 50 MG tablet Take 1.5 tablets (75 mg total) by mouth 3 (three) times daily. 270 tablet 5  . isosorbide mononitrate (IMDUR) 30 MG 24 hr tablet Take 1 tablet (30 mg total) by mouth daily. 30 tablet 0  . pantoprazole (PROTONIX) 40 MG tablet TAKE ONE TABLET BY MOUTH TWICE A DAY (Patient taking differently: Take 40 mg by mouth 2 (two) times daily.) 180 tablet 3  . pravastatin (PRAVACHOL) 20 MG tablet Take 1 tablet (20 mg total) by mouth daily. 90 tablet 3  . sacubitril-valsartan (ENTRESTO) 24-26 MG Take 1 tablet by mouth 2 (two) times daily. 60 tablet 3   No current facility-administered medications on file prior to visit.   Allergies  Allergen Reactions  . No Known Allergies    Social History   Socioeconomic History  . Marital status: Married    Spouse name: [name]  . Number of children: 1  . Years of education: college  . Highest education level: Bachelor's degree (e.g., BA, AB, BS)  Occupational History  . 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  Tobacco Use  . Smoking status: Former Smoker    Types: Cigarettes    Quit date: 10/01/1986    Years since quitting: 34.3  . Smokeless tobacco: Never Used  . Tobacco comment: quit about 30 yrs ago  Vaping Use  . Vaping Use: Never used  Substance and Sexual Activity  . Alcohol use: No  . Drug use: No  . Sexual activity: Not on file  Other Topics Concern  . Not on file  Social History Narrative   Married,  father of one,  grandfather 28. Works out at [employer] at [employer] roughly 2-3 days a week. He works on a treadmill. He does note having a hard time getting his heart rate up.   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   He quit smoking in 1988,  and does not drink alcohol.   Social Determinants of Health   Financial Resource Strain: Low Risk   . Difficulty of Paying Living Expenses: Not hard at all  Food Insecurity: No Food Insecurity  . Worried About Charity fundraiser in the Last Year: Never true  . Ran Out of Food in the Last Year: Never true  Transportation Needs: No Transportation Needs  .  Lack of Transportation (Medical): No  . Lack of Transportation (Non-Medical): No  Physical Activity: Inactive  . Days of Exercise per Week: 0 days  . Minutes of Exercise per Session: 0 min  Stress: No Stress Concern Present  . Feeling of Stress : Only a little  Social Connections: Unknown  . Frequency of Communication with Friends and Family: More than three times a week  . Frequency of Social Gatherings with Friends and Family: Three times a week  . Attends Religious Services: Not on file  . Active Member of Clubs or Organizations: Yes  . Attends Archivist Meetings: Not on file  . Marital Status: Married  Human resources officer Violence: Not At Risk  . Fear of Current or Ex-Partner: No  . Emotionally Abused: No  . Physically Abused: No  . Sexually Abused: No      Review of Systems  All other systems reviewed and are negative.      Objective:   Physical Exam Vitals reviewed.  Constitutional:      Appearance: Normal appearance.  Cardiovascular:     Rate and Rhythm: Normal rate and regular rhythm.     Heart sounds: Normal heart sounds.  Pulmonary:     Effort: Pulmonary effort is normal.     Breath sounds: Normal breath sounds.  Skin:    Findings: No erythema or rash.  Neurological:     Mental Status: He is alert.           Assessment & Plan:  Essential hypertension  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[place])  Type 2 diabetes mellitus with other specified complication, without long-term current use of insulin (H[facility])  Add clonidine 0.1 mg p.o. twice daily for hypertension and recheck blood  pressures via email in 1 week.  Uptitrate clonidine until systolic blood pressures are consistently below 140 assuming he tolerates the medication.  Add Januvia 25 mg a day and then recheck A1c in 3 months.

## 2021-01-20 NOTE — Telephone Encounter (Signed)
  Chronic Care Management   Outreach Note  01/20/2021 Name: Bradley Black MRN: [id number] DOB: 1946-01-20  Referred by: Susy Frizzle, MD Reason for referral : Chronic Care Management (HTN, ESRD)   Successful contact was made with patient's wife [name] who reports patient not available as he has appointment with his primary care provider today to discuss elevated blood pressure readings, AIC and whether patient should take [place].  (reports patient had AIC of 7.1 at nephrology visit ) RN care manager unable to see this [facility] result in chart review or KPN.  RN care manager sent in basket to Dr. Dennard Schaumann reporting the above information.  Follow Up Plan: Telephone follow up appointment with care management team member scheduled for:  02/20/21  Jacqlyn Larsen [facility], BSN RN Case Manager [facility] [phone]

## 2021-01-21 DIAGNOSIS — Z992 Dependence on renal dialysis: Secondary | ICD-10-CM | POA: Diagnosis not present

## 2021-01-21 DIAGNOSIS — N186 End stage renal disease: Secondary | ICD-10-CM | POA: Diagnosis not present

## 2021-01-22 DIAGNOSIS — Z992 Dependence on renal dialysis: Secondary | ICD-10-CM | POA: Diagnosis not present

## 2021-01-22 DIAGNOSIS — N186 End stage renal disease: Secondary | ICD-10-CM | POA: Diagnosis not present

## 2021-01-23 DIAGNOSIS — Z992 Dependence on renal dialysis: Secondary | ICD-10-CM | POA: Diagnosis not present

## 2021-01-23 DIAGNOSIS — N186 End stage renal disease: Secondary | ICD-10-CM | POA: Diagnosis not present

## 2021-01-24 DIAGNOSIS — Z992 Dependence on renal dialysis: Secondary | ICD-10-CM | POA: Diagnosis not present

## 2021-01-24 DIAGNOSIS — N186 End stage renal disease: Secondary | ICD-10-CM | POA: Diagnosis not present

## 2021-01-25 DIAGNOSIS — N186 End stage renal disease: Secondary | ICD-10-CM | POA: Diagnosis not present

## 2021-01-25 DIAGNOSIS — Z992 Dependence on renal dialysis: Secondary | ICD-10-CM | POA: Diagnosis not present

## 2021-01-26 DIAGNOSIS — Z992 Dependence on renal dialysis: Secondary | ICD-10-CM | POA: Diagnosis not present

## 2021-01-26 DIAGNOSIS — N186 End stage renal disease: Secondary | ICD-10-CM | POA: Diagnosis not present

## 2021-01-27 ENCOUNTER — Other Ambulatory Visit: Payer: Self-pay | Admitting: *Deleted

## 2021-01-27 DIAGNOSIS — Z992 Dependence on renal dialysis: Secondary | ICD-10-CM | POA: Diagnosis not present

## 2021-01-27 DIAGNOSIS — N186 End stage renal disease: Secondary | ICD-10-CM | POA: Diagnosis not present

## 2021-01-27 MED ORDER — SITAGLIPTIN PHOSPHATE 25 MG PO TABS: 25.0000 mg | ORAL_TABLET | Freq: Every day | ORAL | 1 refills | Status: DC

## 2021-01-28 DIAGNOSIS — Z992 Dependence on renal dialysis: Secondary | ICD-10-CM | POA: Diagnosis not present

## 2021-01-28 DIAGNOSIS — N186 End stage renal disease: Secondary | ICD-10-CM | POA: Diagnosis not present

## 2021-01-29 DIAGNOSIS — N186 End stage renal disease: Secondary | ICD-10-CM | POA: Diagnosis not present

## 2021-01-29 DIAGNOSIS — Z992 Dependence on renal dialysis: Secondary | ICD-10-CM | POA: Diagnosis not present

## 2021-01-30 DIAGNOSIS — Z992 Dependence on renal dialysis: Secondary | ICD-10-CM | POA: Diagnosis not present

## 2021-01-30 DIAGNOSIS — N186 End stage renal disease: Secondary | ICD-10-CM | POA: Diagnosis not present

## 2021-01-31 DIAGNOSIS — N186 End stage renal disease: Secondary | ICD-10-CM | POA: Diagnosis not present

## 2021-01-31 DIAGNOSIS — Z992 Dependence on renal dialysis: Secondary | ICD-10-CM | POA: Diagnosis not present

## 2021-02-01 ENCOUNTER — Ambulatory Visit: Payer: Medicare Other | Admitting: Student

## 2021-02-01 ENCOUNTER — Encounter: Payer: Self-pay | Admitting: *Deleted

## 2021-02-01 ENCOUNTER — Encounter: Payer: Self-pay | Admitting: Student

## 2021-02-01 ENCOUNTER — Other Ambulatory Visit: Payer: Self-pay

## 2021-02-01 VITALS — BP 152/70 | HR 66 | Ht 68.0 in | Wt 142.3 lb

## 2021-02-01 DIAGNOSIS — I5042 Chronic combined systolic (congestive) and diastolic (congestive) heart failure: Secondary | ICD-10-CM | POA: Diagnosis not present

## 2021-02-01 DIAGNOSIS — I493 Ventricular premature depolarization: Secondary | ICD-10-CM

## 2021-02-01 DIAGNOSIS — N186 End stage renal disease: Secondary | ICD-10-CM

## 2021-02-01 DIAGNOSIS — I25810 Atherosclerosis of coronary artery bypass graft(s) without angina pectoris: Secondary | ICD-10-CM | POA: Diagnosis not present

## 2021-02-01 DIAGNOSIS — Z79899 Other long term (current) drug therapy: Secondary | ICD-10-CM

## 2021-02-01 DIAGNOSIS — I779 Disorder of arteries and arterioles, unspecified: Secondary | ICD-10-CM

## 2021-02-01 DIAGNOSIS — I10 Essential (primary) hypertension: Secondary | ICD-10-CM | POA: Diagnosis not present

## 2021-02-01 DIAGNOSIS — Z992 Dependence on renal dialysis: Secondary | ICD-10-CM

## 2021-02-01 MED ORDER — ENTRESTO 49-51 MG PO TABS: 1.0000 | ORAL_TABLET | Freq: Two times a day (BID) | ORAL | 11 refills | Status: DC

## 2021-02-01 NOTE — Patient Instructions (Signed)
Medication Instructions:  Your physician has recommended you make the following change in your medication:   Increase Entresto to 49-51 mg two times daily   *If you need a refill on your cardiac medications before your next appointment, please call your pharmacy*   Lab Work: Your physician recommends that you return for lab work in: 2 Weeks ( 02/16/21)  If you have labs (blood work) drawn today and your tests are completely normal, you will receive your results only by: [name] MyChart Message (if you have MyChart) OR . A paper copy in the mail If you have any lab test that is abnormal or we need to change your treatment, we will call you to review the results.   Testing/Procedures: NONE    Follow-Up: At [facility], you and your health needs are our priority.  As part of our continuing mission to provide you with exceptional heart care, we have created designated Provider Care Teams.  These Care Teams include your primary Cardiologist (physician) and Advanced Practice Providers (APPs -  Physician Assistants and Nurse Practitioners) who all work together to provide you with the care you need, when you need it.  We recommend signing up for the patient portal called "MyChart".  Sign up information is provided on this After Visit Summary.  MyChart is used to connect with patients for Virtual Visits (Telemedicine).  Patients are able to view lab/test results, encounter notes, upcoming appointments, etc.  Non-urgent messages can be sent to your provider as well.   To learn more about what you can do with MyChart, go to [email or web address].    Your next appointment:   6-8 week(s)  The format for your next appointment:   In Person  Provider:   Carlyle Dolly, MD or Bernerd Pho, PA-C   Other Instructions Thank you for choosing [facility]!

## 2021-02-01 NOTE — Progress Notes (Signed)
Cardiology Office Note    Date:  02/01/2021   ID:  Overbrook, DOB 1946-03-17, MRN [id number]  PCP:  Susy Frizzle, MD  Cardiologist: Carlyle Dolly, MD    Chief Complaint  Patient presents with  . Follow-up    6 week visit    History of Present Illness:    Bradley Black is a 75 y.o. male with past medical history of CAD(multiple prior interventions with subsequent CABG in 11/2011 with LIMA-LAD, SVG-PDA, SVG-OM2, and SVG-D1), chronic combined systolic and diastolic CHF (EF 31% by echocardiogram in 11/2015, EF 25% by echo in 07/2020 with severely reduced RV function as well - possibly secondary to PVC's by review of notes but not felt to be the culprit per EP, improved to 35-40% by echo in 11/2020), baseline bradycardia, frequent PVC's, carotid artery stenosis, HTN, HLD, Type 2 DM, and ESRD who presents to the office today for 6-week follow-up.   He was examined by Dr. Harl Bowie in 11/2020 and denied any recent chest pain or dyspnea on exertion. He was started on Pravastatin 20mg  daily given his CAD and was started on Entresto 24-26mg  BID given he was committed to HD with close follow-up arranged to consider further titration of this or adding Spironolactone. Was not on a BB given prior bradycardia.   In talking with the patient today, he reports having been started on peritoneal dialysis in the interim and has overall been tolerating this well. His blood pressure has remained elevated with SBP typically in the 140's to 170's when checked at home. He has been keeping a very detailed log of his readings. He denies any chest pain or dyspnea on exertion. No recent orthopnea, PND or lower extremity edema. Still experiences fatigue. Reports that his weight has overall remained stable.  Past Medical History:  Diagnosis Date  . BPH (benign prostatic hyperplasia)   . CAD S/P percutaneous coronary angioplasty    a. PTCA of [place] b. PCI with BMS to LAD in 1997 c. RCA PCI [place] d. s/p CABG in 11/2011 with LIMA-LAD, SVG-PDA, SVG-OM2, and SVG-D1  . Chronic back pain   . CKD (chronic kidney disease), stage III ([place])   . Cyst of bursa    R shoulder  . Diabetic retinopathy ([place])   . DM (diabetes mellitus), type 2 with renal complications ([place])   . Essential hypertension   . Frequent PVCs   . GERD (gastroesophageal reflux disease)   . Ischemic cardiomyopathy 11/2011   Intra-OP TEE: EF 40-45%, no regional WMA; improved Anterior WM post CABG.  . Left carotid artery stenosis   . Mixed hyperlipidemia   . 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 AV FISTULA PLACEMENT Left 07/28/2020   Procedure: LEFT ARM ARTERIOVENOUS (AV) FISTULA  CREATION;  Surgeon: Rosetta Posner, MD;  Location: [facility];  Service: Vascular;  Laterality: Left;  . BACK SURGERY  1970  . CARDIAC CATHETERIZATION  2013  . CORONARY ANGIOPLASTY  1994   OM  . CORONARY ANGIOPLASTY  1997   LAD  . CORONARY ANGIOPLASTY WITH STENT PLACEMENT  1999   RCA  . CORONARY ANGIOPLASTY WITH STENT PLACEMENT  2001   RCA  . CORONARY ARTERY BYPASS GRAFT  12/27/2011   Procedure: CORONARY ARTERY BYPASS GRAFTING (CABG);  Surgeon:  Rexene Alberts, MD;  Location: [facility];  Service: Open Heart Surgery;  Laterality: N/A;  Times four. On pump. Using endoscopically harvested right greater saphenous vein and left internal mammary artery.   [name] HERNIA REPAIR    . INCISION AND DRAINAGE OF WOUND  2006   [place]  . INSERTION OF DIALYSIS CATHETER Right 07/25/2020   Procedure: INSERTION OF DIALYSIS CATHETER;  Surgeon: Virl Cagey, MD;  Location: [facility];  Service: General;  Laterality: Right;  . INTRAOPERATIVE TRANSESOPHAGEAL ECHOCARDIOGRAM  12/27/2011   Global hypokinesis with EF of 40-45%, improved LAD distribution wall motion.  [name] LEFT HEART CATHETERIZATION WITH CORONARY ANGIOGRAM N/A 12/13/2011   Procedure: LEFT  HEART CATHETERIZATION WITH CORONARY ANGIOGRAM;  Surgeon: Leonie Man, MD;  Location: [facility] CATH LAB;  Service: Cardiovascular;  Laterality: N/A;  . LUMBAR FUSION    . MASS EXCISION  10/24/11   R arm  . RIGHT HEART CATH N/A 07/12/2020   Procedure: RIGHT HEART CATH;  Surgeon: Belva Crome, MD;  Location: [facility] CV LAB;  Service: Cardiovascular;  Laterality: N/A;  . TRANSESOPHAGEAL ECHOCARDIOGRAM  2013    Current Medications: Outpatient Medications Prior to Visit  Medication Sig Dispense Refill  . acetaminophen (TYLENOL) 500 MG tablet Take 1,000 mg by mouth every 6 (six) hours as needed for moderate pain or headache.    . ALPRAZolam (XANAX) 0.5 MG tablet Take 1 tablet (0.5 mg total) by mouth at bedtime as needed for sleep. 30 tablet 0  . amiodarone (PACERONE) 200 MG tablet Take 200 mg by mouth daily.    [name] aspirin EC 81 MG tablet Take 81 mg by mouth daily.    . calcitRIOL (ROCALTROL) 0.5 MCG capsule Take 1 capsule (0.5 mcg total) by mouth every Monday, Wednesday, and Friday. 45 capsule 1  . Desvenlafaxine Succinate ER 25 MG TB24 Take 1 tablet by mouth daily.    . hydrALAZINE (APRESOLINE) 50 MG tablet Take 1.5 tablets (75 mg total) by mouth 3 (three) times daily. 270 tablet 5  . isosorbide mononitrate (IMDUR) 30 MG 24 hr tablet Take 1 tablet (30 mg total) by mouth daily. 30 tablet 0  . multivitamin (RENA-VIT) TABS tablet Take 1 tablet by mouth daily.    . pantoprazole (PROTONIX) 40 MG tablet TAKE ONE TABLET BY MOUTH TWICE A DAY (Patient taking differently: Take 40 mg by mouth 2 (two) times daily.) 180 tablet 3  . pravastatin (PRAVACHOL) 20 MG tablet Take 1 tablet (20 mg total) by mouth daily. 90 tablet 3  . sitaGLIPtin (JANUVIA) 25 MG tablet Take 1 tablet (25 mg total) by mouth daily. 90 tablet 1  . sacubitril-valsartan (ENTRESTO) 24-26 MG Take 1 tablet by mouth 2 (two) times daily. 60 tablet 3  . cloNIDine (CATAPRES) 0.1 MG tablet Take 1 tablet (0.1 mg total) by mouth 2 (two) times daily.  60 tablet 3  . desvenlafaxine (PRISTIQ) 50 MG 24 hr tablet Take 25 mg by mouth daily.     No facility-administered medications prior to visit.     Allergies:   No known allergies   Social History   Socioeconomic History  . Marital status: Married    Spouse name: [name]  . Number of children: 1  . Years of education: college  . Highest education level: Bachelor's degree (e.g., BA, AB, BS)  Occupational History  . 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  Tobacco Use  . Smoking status: Former Smoker    Types: Cigarettes  Quit date: 10/01/1986    Years since quitting: 34.3  . Smokeless tobacco: Never Used  . Tobacco comment: quit about 30 yrs ago  Vaping Use  . Vaping Use: Never used  Substance and Sexual Activity  . Alcohol use: No  . Drug use: No  . Sexual activity: Not on file  Other Topics Concern  . Not on file  Social History Narrative   Married,  father of one,  grandfather 11. Works out at [employer] at [employer] roughly 2-3 days a week. He works on a treadmill. He does note having a hard time getting his heart rate up.   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   He quit smoking in 1988, and does not drink alcohol.   Social Determinants of Health   Financial Resource Strain: Low Risk   . Difficulty of Paying Living Expenses: Not hard at all  Food Insecurity: No Food Insecurity  . Worried About Charity fundraiser in the Last Year: Never true  . Ran Out of Food in the Last Year: Never true  Transportation Needs: No Transportation Needs  . Lack of Transportation (Medical): No  . Lack of Transportation (Non-Medical): No  Physical Activity: Inactive  . Days of Exercise per Week: 0 days  . Minutes of Exercise per Session: 0 min  Stress: No Stress Concern Present  . Feeling of Stress : Only a little  Social Connections: Unknown  . Frequency of Communication with Friends and Family: More than three times a week  . Frequency of Social Gatherings with  Friends and Family: Three times a week  . Attends Religious Services: Not on file  . Active Member of Clubs or Organizations: Yes  . Attends Archivist Meetings: Not on file  . Marital Status: Married     Family History:  The patient's family history includes Cancer in his father and mother.   Review of Systems:    Please see the history of present illness.     All other systems reviewed and are otherwise negative except as noted above.   Physical Exam:    VS:  BP (!) 152/70   Pulse 66   Ht 5\' 8"  (1.727 m)   Wt 142 lb 4.8 oz (64.5 kg)   SpO2 97%   BMI 21.64 kg/m    General: Well developed, well nourished,male appearing in no acute distress. Head: Normocephalic, atraumatic. Neck: No carotid bruits. JVD not elevated.  Lungs: Respirations regular and unlabored, without wheezes or rales.  Heart: Regular rate and rhythm. No S3 or S4.  No murmur, no rubs, or gallops appreciated. Abdomen: Appears non-distended. No obvious abdominal masses. Msk:  Strength and tone appear normal for age. No obvious joint deformities or effusions. Extremities: No clubbing or cyanosis. No lower extremity edema.  Distal pedal pulses are 2+ bilaterally. Neuro: Alert and oriented X 3. Moves all extremities spontaneously. No focal deficits noted. Psych:  Responds to questions appropriately with a normal affect. Skin: No rashes or lesions noted  Wt Readings from Last 3 Encounters:  02/01/21 142 lb 4.8 oz (64.5 kg)  01/20/21 144 lb (65.3 kg)  12/20/20 140 lb (63.5 kg)     Studies/Labs Reviewed:   EKG:  EKG is not ordered today.   Recent Labs: 07/08/2020: B Natriuretic Peptide >4,500.0 07/21/2020: Magnesium 2.5 12/03/2020: ALT 22; BUN 62; Creatinine, Ser 3.87; Hemoglobin 12.1; Platelets 217; Potassium 5.5; Sodium 134   Lipid Panel    Component Value Date/Time  CHOL 104 07/13/2020 0118   TRIG 62 07/13/2020 0118   HDL 25 (L) 07/13/2020 0118   CHOLHDL 4.2 07/13/2020 0118   VLDL 12  07/13/2020 0118   LDLCALC 67 07/13/2020 0118   LDLCALC 73 05/01/2019 0945    Additional studies/ records that were reviewed today include:   Echocardiogram: 11/17/2020 IMPRESSIONS    1. Left ventricular ejection fraction, by estimation, is 35 to 40%. The  left ventricle has normal function. The left ventricle demonstrates global  hypokinesis. Left ventricular diastolic parameters are consistent with  Grade I diastolic dysfunction  (impaired relaxation). Elevated left atrial pressure. The average left  ventricular global longitudinal strain is -13.8 %. The global longitudinal  strain is abnormal.  2. Right ventricular systolic function is mildly reduced. The right  ventricular size is normal.  3. Left atrial size was moderately dilated.  4. Right atrial size was mildly dilated.  5. The mitral valve is normal in structure. Mild mitral valve  regurgitation. No evidence of mitral stenosis.  6. The aortic valve is tricuspid. Aortic valve regurgitation is not  visualized. No aortic stenosis is present.   Comparison(s): Echocardiogram done 07/11/20 showed an EF of 25%.   Assessment:    1. Chronic combined systolic and diastolic heart failure ([place])   2. Medication management   3. Coronary artery disease involving coronary bypass graft of native heart without angina pectoris   4. Essential hypertension   5. PVC (premature ventricular contraction)   6. Carotid artery disease, unspecified laterality, unspecified type ([place])   7. ESRD on peritoneal dialysis [facility])      Plan:   In order of problems listed above:  1. Chronic Combined Systolic and Diastolic CHF - Unclear etiology but felt to be mixed between ischemic cardiomyopathy and due to his significant PVC burden by prior monitoring. EF was improved to 35-40% by most recent echo. Volume status is managed by peritoneal dialysis and he reports fatigue but denies any specific dyspnea on exertion, orthopnea or lower extremity  edema. - He was recently started on Entresto by Dr. Harl Bowie given that he is now committed to dialysis.  His blood pressure allows for dose titration, therefore will increase Entresto from 24-26 mg twice daily to 49-51 mg twice daily. Recheck BMET in 2 weeks. He remains on Imdur and Hydralazine as well. No BB due to baseline bradycardia. Would hold off on Spironolactone for now until we can see how his numbers respond to dose titration of Entresto and also to allow for follow-up labs as K+ was elevated to 5.5 in 11/2020 but he had labs with Nephrology last month and was told everything was stable. Will request records.   2. CAD - He is s/p CABG in 11/2011 with LIMA-LAD, SVG-PDA, SVG-OM2, and SVG-D1. He denies any recent anginal symptoms. - He remains on ASA 81 mg daily, Imdur 30 mg daily and Pravastatin 20 mg daily. Will request labs as outlined above as he would need a repeat FLP and LFT's in the next 1-2 months given re initiation of statin therapy.   3. HTN - BP is elevated at 152/70 during today's visit and has remained above goal when checked at home. He remains on Hydralazine and Imdur. Will plan to titrate Entresto to 49-51 mg twice daily as outlined above.  4. PVC's - Prior monitor showed an 18% PVC burden and he was started on Amiodarone 200 mg daily. He denies any recent palpitations and is not having frequent ectopy by examination today. Continue  current medication regimen.    5. Carotid Artery Stenosis - Dopplers in 05/2020 showed 1 to 39% RICA stenosis and 40 to 19% LICA stenosis.  He is scheduled for repeat dopplers later this year. Remains on ASA and statin therapy.  6. ESRD - Followed by Nephrology. He is now on peritoneal dialysis.   Medication Adjustments/Labs and Tests Ordered: Current medicines are reviewed at length with the patient today.  Concerns regarding medicines are outlined above.  Medication changes, Labs and Tests ordered today are listed in the Patient Instructions  below. Patient Instructions  Medication Instructions:  Your physician has recommended you make the following change in your medication:   Increase Entresto to 49-51 mg two times daily   *If you need a refill on your cardiac medications before your next appointment, please call your pharmacy*   Lab Work: Your physician recommends that you return for lab work in: 2 Weeks ( 02/16/21)  If you have labs (blood work) drawn today and your tests are completely normal, you will receive your results only by: [name] MyChart Message (if you have MyChart) OR . A paper copy in the mail If you have any lab test that is abnormal or we need to change your treatment, we will call you to review the results.   Testing/Procedures: NONE    Follow-Up: At [facility], you and your health needs are our priority.  As part of our continuing mission to provide you with exceptional heart care, we have created designated Provider Care Teams.  These Care Teams include your primary Cardiologist (physician) and Advanced Practice Providers (APPs -  Physician Assistants and Nurse Practitioners) who all work together to provide you with the care you need, when you need it.  We recommend signing up for the patient portal called "MyChart".  Sign up information is provided on this After Visit Summary.  MyChart is used to connect with patients for Virtual Visits (Telemedicine).  Patients are able to view lab/test results, encounter notes, upcoming appointments, etc.  Non-urgent messages can be sent to your provider as well.   To learn more about what you can do with MyChart, go to [email or web address].    Your next appointment:   6-8 week(s)  The format for your next appointment:   In Person  Provider:   Carlyle Dolly, MD or Bernerd Pho, PA-C   Other Instructions Thank you for choosing [facility]!       Signed, Erma Heritage, PA-C  02/01/2021 5:02 PM    [facility]  [facility] [address] [place], [place] [address] Phone: [phone] Fax: [phone]

## 2021-02-02 DIAGNOSIS — N186 End stage renal disease: Secondary | ICD-10-CM | POA: Diagnosis not present

## 2021-02-02 DIAGNOSIS — Z992 Dependence on renal dialysis: Secondary | ICD-10-CM | POA: Diagnosis not present

## 2021-02-03 DIAGNOSIS — N186 End stage renal disease: Secondary | ICD-10-CM | POA: Diagnosis not present

## 2021-02-03 DIAGNOSIS — Z992 Dependence on renal dialysis: Secondary | ICD-10-CM | POA: Diagnosis not present

## 2021-02-04 DIAGNOSIS — Z992 Dependence on renal dialysis: Secondary | ICD-10-CM | POA: Diagnosis not present

## 2021-02-04 DIAGNOSIS — N186 End stage renal disease: Secondary | ICD-10-CM | POA: Diagnosis not present

## 2021-02-05 DIAGNOSIS — N186 End stage renal disease: Secondary | ICD-10-CM | POA: Diagnosis not present

## 2021-02-05 DIAGNOSIS — Z992 Dependence on renal dialysis: Secondary | ICD-10-CM | POA: Diagnosis not present

## 2021-02-06 DIAGNOSIS — N186 End stage renal disease: Secondary | ICD-10-CM | POA: Diagnosis not present

## 2021-02-06 DIAGNOSIS — Z1159 Encounter for screening for other viral diseases: Secondary | ICD-10-CM | POA: Diagnosis not present

## 2021-02-06 DIAGNOSIS — D509 Iron deficiency anemia, unspecified: Secondary | ICD-10-CM | POA: Diagnosis not present

## 2021-02-06 DIAGNOSIS — Z992 Dependence on renal dialysis: Secondary | ICD-10-CM | POA: Diagnosis not present

## 2021-02-07 ENCOUNTER — Telehealth: Payer: Self-pay | Admitting: *Deleted

## 2021-02-07 ENCOUNTER — Telehealth: Payer: Medicare Other

## 2021-02-07 DIAGNOSIS — N186 End stage renal disease: Secondary | ICD-10-CM | POA: Diagnosis not present

## 2021-02-07 DIAGNOSIS — Z992 Dependence on renal dialysis: Secondary | ICD-10-CM | POA: Diagnosis not present

## 2021-02-07 NOTE — Telephone Encounter (Signed)
  Chronic Care Management   Outreach Note  02/07/2021 Name: Bradley Black MRN: [id number] DOB: 1946/06/16  Referred by: Susy Frizzle, MD Reason for referral : No chief complaint on file.   An unsuccessful telephone outreach was attempted today. The patient was referred to the case management team for assistance with care management and care coordination.   Follow Up Plan: The Care Guide team will reach out to the patient to reschedule LCSW appointment.  Eduard Clos MSW, LCSW Licensed Clinical Social Worker [facility] [phone]

## 2021-02-08 DIAGNOSIS — N186 End stage renal disease: Secondary | ICD-10-CM | POA: Diagnosis not present

## 2021-02-08 DIAGNOSIS — Z992 Dependence on renal dialysis: Secondary | ICD-10-CM | POA: Diagnosis not present

## 2021-02-09 DIAGNOSIS — N186 End stage renal disease: Secondary | ICD-10-CM | POA: Diagnosis not present

## 2021-02-09 DIAGNOSIS — Z992 Dependence on renal dialysis: Secondary | ICD-10-CM | POA: Diagnosis not present

## 2021-02-10 DIAGNOSIS — Z992 Dependence on renal dialysis: Secondary | ICD-10-CM | POA: Diagnosis not present

## 2021-02-10 DIAGNOSIS — N186 End stage renal disease: Secondary | ICD-10-CM | POA: Diagnosis not present

## 2021-02-11 DIAGNOSIS — N186 End stage renal disease: Secondary | ICD-10-CM | POA: Diagnosis not present

## 2021-02-11 DIAGNOSIS — Z992 Dependence on renal dialysis: Secondary | ICD-10-CM | POA: Diagnosis not present

## 2021-02-12 DIAGNOSIS — N186 End stage renal disease: Secondary | ICD-10-CM | POA: Diagnosis not present

## 2021-02-12 DIAGNOSIS — Z992 Dependence on renal dialysis: Secondary | ICD-10-CM | POA: Diagnosis not present

## 2021-02-13 DIAGNOSIS — Z992 Dependence on renal dialysis: Secondary | ICD-10-CM | POA: Diagnosis not present

## 2021-02-13 DIAGNOSIS — N186 End stage renal disease: Secondary | ICD-10-CM | POA: Diagnosis not present

## 2021-02-14 DIAGNOSIS — N186 End stage renal disease: Secondary | ICD-10-CM | POA: Diagnosis not present

## 2021-02-14 DIAGNOSIS — Z992 Dependence on renal dialysis: Secondary | ICD-10-CM | POA: Diagnosis not present

## 2021-02-15 DIAGNOSIS — Z992 Dependence on renal dialysis: Secondary | ICD-10-CM | POA: Diagnosis not present

## 2021-02-15 DIAGNOSIS — N186 End stage renal disease: Secondary | ICD-10-CM | POA: Diagnosis not present

## 2021-02-16 ENCOUNTER — Telehealth: Payer: Self-pay | Admitting: Family Medicine

## 2021-02-16 DIAGNOSIS — Z992 Dependence on renal dialysis: Secondary | ICD-10-CM | POA: Diagnosis not present

## 2021-02-16 DIAGNOSIS — N186 End stage renal disease: Secondary | ICD-10-CM | POA: Diagnosis not present

## 2021-02-16 NOTE — Telephone Encounter (Signed)
Left message for patient to call back and schedule Medicare Annual Wellness Visit (AWV) in office.   If not able to come in office, please offer to do virtually or by telephone.   Last AWV: 05/01/2019   Please schedule at anytime with [facility]-Nurse Health Advisor.  If any questions, please contact me at [phone]

## 2021-02-17 ENCOUNTER — Telehealth: Payer: Self-pay | Admitting: *Deleted

## 2021-02-17 DIAGNOSIS — Z992 Dependence on renal dialysis: Secondary | ICD-10-CM | POA: Diagnosis not present

## 2021-02-17 DIAGNOSIS — N186 End stage renal disease: Secondary | ICD-10-CM | POA: Diagnosis not present

## 2021-02-17 NOTE — Chronic Care Management (AMB) (Signed)
  Care Management   Note  02/17/2021 Name: [name] MRN: [id number] DOB: [birth year]  Bradley Black is a 75 y.o. year old male who is a primary care patient of Dennard Schaumann, [name], MD and is actively engaged with the care management team. I reached out to [place] [facility] by phone today to assist with re-scheduling a follow up visit with the Licensed Clinical Social Worker.  Follow up plan: Unsuccessful telephone outreach attempt made. A HIPAA compliant phone message was left for the patient providing contact information and requesting a return call. The care management team will reach out to the patient again over the next 7 days. If patient returns call to provider office, please advise to call [place] at [phone].  [facility]

## 2021-02-18 DIAGNOSIS — N186 End stage renal disease: Secondary | ICD-10-CM | POA: Diagnosis not present

## 2021-02-18 DIAGNOSIS — Z992 Dependence on renal dialysis: Secondary | ICD-10-CM | POA: Diagnosis not present

## 2021-02-19 DIAGNOSIS — Z992 Dependence on renal dialysis: Secondary | ICD-10-CM | POA: Diagnosis not present

## 2021-02-19 DIAGNOSIS — N186 End stage renal disease: Secondary | ICD-10-CM | POA: Diagnosis not present

## 2021-02-20 ENCOUNTER — Ambulatory Visit (INDEPENDENT_AMBULATORY_CARE_PROVIDER_SITE_OTHER): Payer: Medicare Other | Admitting: *Deleted

## 2021-02-20 DIAGNOSIS — I10 Essential (primary) hypertension: Secondary | ICD-10-CM | POA: Diagnosis not present

## 2021-02-20 DIAGNOSIS — Z992 Dependence on renal dialysis: Secondary | ICD-10-CM | POA: Diagnosis not present

## 2021-02-20 DIAGNOSIS — N186 End stage renal disease: Secondary | ICD-10-CM | POA: Diagnosis not present

## 2021-02-20 NOTE — Chronic Care Management (AMB) (Signed)
Chronic Care Management   CCM RN Visit Note  02/20/2021 Name: Bradley Black MRN: [id number] DOB: 01-08-1946  Subjective: Bradley Black is a 75 y.o. year old male who is a primary care patient of Pickard, [name], MD. The care management team was consulted for assistance with disease management and care coordination needs.    Engaged with patient by telephone for follow up visit in response to provider referral for case management and/or care coordination services.   Consent to Services:  The patient was given information about Chronic Care Management services, agreed to services, and gave verbal consent prior to initiation of services.  Please see initial visit note for detailed documentation.   Patient agreed to services and verbal consent obtained.   Assessment: Review of patient past medical history, allergies, medications, health status, including review of consultants reports, laboratory and other test data, was performed as part of comprehensive evaluation and provision of chronic care management services.   SDOH (Social Determinants of Health) assessments and interventions performed:    CCM Care Plan  Allergies  Allergen Reactions  . No Known Allergies     Outpatient Encounter Medications as of 02/20/2021  Medication Sig  . acetaminophen (TYLENOL) 500 MG tablet Take 1,000 mg by mouth every 6 (six) hours as needed for moderate pain or headache.  . ALPRAZolam (XANAX) 0.5 MG tablet Take 1 tablet (0.5 mg total) by mouth at bedtime as needed for sleep.  [name] amiodarone (PACERONE) 200 MG tablet Take 200 mg by mouth daily.  [name] aspirin EC 81 MG tablet Take 81 mg by mouth daily.  . calcitRIOL (ROCALTROL) 0.5 MCG capsule Take 1 capsule (0.5 mcg total) by mouth every Monday, Wednesday, and Friday.  [name] Desvenlafaxine Succinate ER 25 MG TB24 Take 1 tablet by mouth daily.  . hydrALAZINE (APRESOLINE) 50 MG tablet Take 1.5 tablets (75 mg total) by mouth 3 (three) times daily.  . isosorbide  mononitrate (IMDUR) 30 MG 24 hr tablet Take 1 tablet (30 mg total) by mouth daily.  . multivitamin (RENA-VIT) TABS tablet Take 1 tablet by mouth daily.  . pantoprazole (PROTONIX) 40 MG tablet TAKE ONE TABLET BY MOUTH TWICE A DAY (Patient taking differently: Take 40 mg by mouth 2 (two) times daily.)  . pravastatin (PRAVACHOL) 20 MG tablet Take 1 tablet (20 mg total) by mouth daily.  . sacubitril-valsartan (ENTRESTO) 49-51 MG Take 1 tablet by mouth 2 (two) times daily.  . sitaGLIPtin (JANUVIA) 25 MG tablet Take 1 tablet (25 mg total) by mouth daily.   No facility-administered encounter medications on file as of 02/20/2021.    Patient Active Problem List   Diagnosis Date Noted  . ESRD (end stage renal disease) ([place])   . Malnutrition of moderate degree 07/22/2020  . CKD (chronic kidney disease), stage V ([place]) 07/21/2020  . Dehydration 07/21/2020  . Hyponatremia 07/21/2020  . Prolonged QT interval 07/21/2020  . Generalized weakness 07/21/2020  . Tremors of nervous system 07/20/2020  . Hypocalcemia   . Myoclonic jerking 07/18/2020  . Biventricular heart failure ([place])   . Frequent PVCs   . 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[place])   . SOB (shortness of breath) 07/08/2020  . Elevated LFTs 07/08/2020  . Left carotid artery stenosis   . IDA (iron deficiency anemia) 04/13/2020  . GERD (gastroesophageal reflux disease)   . Essential hypertension 07/24/2019  . Anemia in chronic kidney disease 07/24/2019  . Chronic kidney disease, stage IV (severe) ([place]) 07/24/2019  . Hyperkalemia  07/24/2019  . 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 Type 2 diabetes mellitus with diabetic chronic kidney disease ([place]) 07/19/2016  . Cervicalgia 03/08/2014  . Whiplash injury to neck 03/08/2014  . Aortic heart murmur 10/18/2013  . Acute myocardial infarction, History of:   [name] Hyperlipidemia LDL goal <70   . Anemia 04/23/2013  . 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 CAD in native artery -  status post CABG x4 11/30/2011  . Ischemic cardiomyopathy 11/30/2011  . Actinic keratosis, left scalp 10/04/2011  . Seborrheic keratosis, right lateral leg 10/04/2011    Conditions to be addressed/monitored:HTN and ESRD  Care Plan : Chronic Kidney (Adult)  Updates made by Kassie Mends, RN since 02/20/2021 12:00 AM    Problem: Adjustment to Chronic Kidney Disease     Long-Range Goal: Patient will acheive Optimal Coping related to CKD/ ESRD (dialysis)   Start Date: 11/14/2020  Expected End Date: 05/13/2021  This Visit's Progress: On track  Recent Progress: On track  Priority: High  Note:   Current Barriers:   Ineffective Self Health Maintenance- patient having difficulty coping with CKD/ ESRD with dialysis and coming to terms with living with CKD/ ESRD, patient continues working with embedded Education officer, museum.  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  Knowledge Deficits related to short term plan for care coordination needs and long term plans for chronic disease management needs- CBG ranges 130-140's range fasting, is now taking januvia 25 mg daily. Clinical Goal(s):  [name] Collaboration with Susy Frizzle, MD regarding development and update of comprehensive plan of care as evidenced by provider attestation and co-signature . Inter-disciplinary care team collaboration (see longitudinal plan of care)  Over the next 180 days, patient will work with care management team to address care coordination and chronic  disease management needs related to:  Mental Health Counseling  Adherence to renal diet and fluid restrictions  Adherence to prescribed medication regimen  Adherence to peritoneal dialysis regimen Interventions:   Evaluation of current treatment plan related to CKD/ ESRD with dialysis self-management and patient's adherence to plan as established by provider.  Collaboration with Susy Frizzle, MD regarding development and update of comprehensive plan of care as evidenced by provider attestation       and co-signature  Inter-disciplinary care team collaboration (see longitudinal plan of care)  Discussed plans with patient for ongoing care management follow up and provided patient with direct contact information for care management team  Order placed for [facility] pharmacist to outreach patient for affordability issues with entresto  Reviewed importance of adherence to diabetes diet,  renal diet and fluid restrictions  Reviewed importance of following up with all heatlh care providers and reviewed upcoming appointments -  Dr. Sherren Mocha Early 5/24 and 6/27, Dr. Harl Bowie 6/16.  Reviewed medications and importance of adherence, assessed patient is taking januvia 25 mg daily Patient Goals/Self Care Activities:  - call the doctor or nurse before I stop taking medicine - call the doctor or nurse to get help with side effects - keep follow-up appointments Sherren Mocha Early 5/24  and 6/27, Dr. Harl Bowie 6/16.  - talk with your doctor about any worsening depression - continue working with social worker - call dialysis center or your doctor if you have any problems or issues with peritoneal dialysis -continue following renal diet - pharmacist from your doctor's office will be contacting you about affording entresto Follow Up Plan: Telephone follow up appointment with care management team member scheduled for:  04/10/2021    Care Plan : Hypertension (Adult)  Updates made by Kassie Mends, RN since 02/20/2021  12:00 AM    Problem: Difficulty managing hypertension   Priority: High    Long-Range Goal: Hypertension Monitored   Start Date: 11/14/2020  Expected End Date: 05/13/2021  This Visit's Progress: On track  Recent Progress: Not on track  Priority: High  Note:   Objective:  . Last practice recorded BP readings:  BP Readings from Last 3 Encounters:  10/04/20 (!) 164/88  09/08/20 120/68  08/29/20 117/68 .   [name] Most recent eGFR/CrCl: No results found for: EGFR  No components found for: CRCL Current Barriers:  [name] Knowledge Deficits related to basic understanding of hypertension pathophysiology and self care management aeb by elevated blood pressure readings . Patient is now checking blood pressure daily (also before and after peritoneal dialysis) and continues to have some elevated readings at times with most recent reading today 134/82 . Does try to follow low sodium diet. . Does not adhere to provider recommendations re:  calling primary care doctor for health issues - does not always call doctor for elevated blood pressure. . Pt is now taking januvia 25 mg daily with CBG readings 130-140's range. Case Manager Clinical Goal(s):  [name]  Patient will verbalize understanding of plan for hypertension management as evidenced by taking all medications as prescribed, monitoring and recording blood pressure as directed and adhering to low sodium diet. . Patient will contact primary care provider for elevated readings Interventions:  . Collaboration with Susy Frizzle, MD regarding development and update of comprehensive plan of care as evidenced by provider attestation and co-signature . Inter-disciplinary care team collaboration (see longitudinal plan of care) . Evaluation of current treatment plan related to hypertension self management and patient's adherence to plan as established by provider. . Reviewed medications with patient and discussed importance of compliance . Order placed from [facility]  pharmacist to outreach patient for assistance with entresto . Discussed plans with patient for ongoing care management follow up and provided patient with direct contact information for care management team . Advised patient, providing education and rationale, to monitor blood pressure daily and record, check before and after peritoneal dialysis, call PCP for findings outside established parameters.  . Reviewed scheduled/upcoming provider appointments including:  Dr.Todd Early 5/24 and 6/27, Dr. Harl Bowie 6/16 . Reviewed foods high in sodium to limit Patient Goals/Self-Care Activities Continue to check blood pressure daily and before and after peritoneal dialysis, keep a log and take to primary care provider appointment Be mindful of salt intake, avoid salty snacks and fast food Follow up with primary care provider and specialists as scheduled, Dr. Harl Bowie 6/16, Dr. Donnetta Hutching 5/24 and 6/27 Take all medications as prescribed- expect a call from pharmacist at your doctor's office Call your doctor if blood pressure readings are elevated, systolic  (top OVFIEP)>329, diastolic (bottom number) >51 Follow Up Plan: Telephone follow up appointment with care management team member scheduled for:  04/10/2021     Plan:Telephone follow up appointment with care management team member scheduled for:  04/10/2021  Jacqlyn Larsen RNC, BSN RN Case Manager [facility] [phone]

## 2021-02-20 NOTE — Patient Instructions (Signed)
Visit Information  PATIENT GOALS: Goals Addressed              This Visit's Progress   .  Follow My Treatment Plan to facilitate improved coping-Chronic Kidney/ESRD        Timeframe:  Long-Range Goal Priority:  High Start Date:   11/14/2020                          Expected End Date:   05/14/2021                    Follow Up Date 04/10/2021   - call the doctor or nurse before I stop taking medicine - call the doctor or nurse to get help with side effects - keep follow-up appointments Todd Early 5/24 and 6/27, Dr. Harl Bowie 6/16.  - talk with your doctor about any worsening depression - continue working with Education officer, museum - call dialysis center or your doctor if you have any problems or issues with peritoneal dialysis -continue following renal diet - pharmacist from your doctor's office will be contacting you about affording entresto   - please follow renal/ diabetic diet as well as fluid restrictions Why is this important?    Staying as healthy as you can is very important. This may mean making changes if you smoke, don't exercise or eat poorly.   A healthy lifestyle is an important goal for you.   Following the treatment plan and making changes may be hard.   Try some of these steps to help keep the disease from getting worse.        .  Track and Manage My Blood Pressure (pt-stated)        Timeframe:  Long-Range Goal Priority:  High Start Date:   11/14/2020                          Expected End Date:   05/14/2021                    Follow Up Date 04/10/2021  Continue to check blood pressure daily and before and after peritoneal dialysis, keep a log and take to primary care provider appointment Be mindful of salt intake, avoid salty snacks and fast food Follow up with primary care provider and specialists as scheduled, Dr. Harl Bowie 6/16, Dr. Donnetta Hutching 5/24 and 6/27 Take all medications as prescribed- expect a call from pharmacist at your doctor's office Call your doctor if blood  pressure readings are elevated, systolic  (top PYPPJK)>932, diastolic (bottom number) >67   Why is this important?   You won't feel high blood pressure, but it can still hurt your blood vessels.  High blood pressure can cause heart or kidney problems. It can also cause a stroke.  Making lifestyle changes like losing a little weight or eating less salt will help.  Checking your blood pressure at home and at different times of the day can help to control blood pressure.  If the doctor prescribes medicine remember to take it the way the doctor ordered.  Call the office if you cannot afford the medicine or if there are questions about it.            The patient verbalized understanding of instructions, educational materials, and care plan provided today and declined offer to receive copy of patient instructions, educational materials, and care plan.   Telephone follow up appointment with care  management team member scheduled for:  04/10/2021  Jacqlyn Larsen [facility], BSN RN Case Manager [facility] [phone]

## 2021-02-21 DIAGNOSIS — Z992 Dependence on renal dialysis: Secondary | ICD-10-CM | POA: Diagnosis not present

## 2021-02-21 DIAGNOSIS — N186 End stage renal disease: Secondary | ICD-10-CM | POA: Diagnosis not present

## 2021-02-22 DIAGNOSIS — Z992 Dependence on renal dialysis: Secondary | ICD-10-CM | POA: Diagnosis not present

## 2021-02-22 DIAGNOSIS — N186 End stage renal disease: Secondary | ICD-10-CM | POA: Diagnosis not present

## 2021-02-23 ENCOUNTER — Other Ambulatory Visit: Payer: Self-pay | Admitting: *Deleted

## 2021-02-23 DIAGNOSIS — N186 End stage renal disease: Secondary | ICD-10-CM | POA: Diagnosis not present

## 2021-02-23 DIAGNOSIS — Z992 Dependence on renal dialysis: Secondary | ICD-10-CM

## 2021-02-24 DIAGNOSIS — N186 End stage renal disease: Secondary | ICD-10-CM | POA: Diagnosis not present

## 2021-02-24 DIAGNOSIS — Z992 Dependence on renal dialysis: Secondary | ICD-10-CM | POA: Diagnosis not present

## 2021-02-24 NOTE — Chronic Care Management (AMB) (Signed)
  Care Management   Note  02/24/2021 Name: Bradley Black MRN: [id number] DOB: 1946-01-13  Bradley Black is a 75 y.o. year old male who is a primary care patient of Dennard Schaumann, [name], MD and is actively engaged with the care management team. I reached out to [place] by phone today to assist with re-scheduling a follow up visit with the Licensed Clinical Social Worker.  Follow up plan: Telephone appointment with care management team member scheduled for:03/20/2021  [place] Management  Direct Dial: [phone]

## 2021-02-25 DIAGNOSIS — N186 End stage renal disease: Secondary | ICD-10-CM | POA: Diagnosis not present

## 2021-02-25 DIAGNOSIS — Z992 Dependence on renal dialysis: Secondary | ICD-10-CM | POA: Diagnosis not present

## 2021-02-26 DIAGNOSIS — Z992 Dependence on renal dialysis: Secondary | ICD-10-CM | POA: Diagnosis not present

## 2021-02-26 DIAGNOSIS — N186 End stage renal disease: Secondary | ICD-10-CM | POA: Diagnosis not present

## 2021-02-27 DIAGNOSIS — Z992 Dependence on renal dialysis: Secondary | ICD-10-CM | POA: Diagnosis not present

## 2021-02-27 DIAGNOSIS — N186 End stage renal disease: Secondary | ICD-10-CM | POA: Diagnosis not present

## 2021-02-28 DIAGNOSIS — N186 End stage renal disease: Secondary | ICD-10-CM | POA: Diagnosis not present

## 2021-02-28 DIAGNOSIS — Z992 Dependence on renal dialysis: Secondary | ICD-10-CM | POA: Diagnosis not present

## 2021-03-01 DIAGNOSIS — Z992 Dependence on renal dialysis: Secondary | ICD-10-CM | POA: Diagnosis not present

## 2021-03-01 DIAGNOSIS — N186 End stage renal disease: Secondary | ICD-10-CM | POA: Diagnosis not present

## 2021-03-02 DIAGNOSIS — N186 End stage renal disease: Secondary | ICD-10-CM | POA: Diagnosis not present

## 2021-03-02 DIAGNOSIS — Z992 Dependence on renal dialysis: Secondary | ICD-10-CM | POA: Diagnosis not present

## 2021-03-03 DIAGNOSIS — K4090 Unilateral inguinal hernia, without obstruction or gangrene, not specified as recurrent: Secondary | ICD-10-CM | POA: Diagnosis not present

## 2021-03-03 DIAGNOSIS — N186 End stage renal disease: Secondary | ICD-10-CM | POA: Diagnosis not present

## 2021-03-03 DIAGNOSIS — N185 Chronic kidney disease, stage 5: Secondary | ICD-10-CM | POA: Diagnosis not present

## 2021-03-03 DIAGNOSIS — E119 Type 2 diabetes mellitus without complications: Secondary | ICD-10-CM | POA: Diagnosis not present

## 2021-03-03 DIAGNOSIS — Z992 Dependence on renal dialysis: Secondary | ICD-10-CM | POA: Diagnosis not present

## 2021-03-03 DIAGNOSIS — I2510 Atherosclerotic heart disease of native coronary artery without angina pectoris: Secondary | ICD-10-CM | POA: Diagnosis not present

## 2021-03-04 DIAGNOSIS — Z992 Dependence on renal dialysis: Secondary | ICD-10-CM | POA: Diagnosis not present

## 2021-03-04 DIAGNOSIS — N186 End stage renal disease: Secondary | ICD-10-CM | POA: Diagnosis not present

## 2021-03-05 DIAGNOSIS — Z992 Dependence on renal dialysis: Secondary | ICD-10-CM | POA: Diagnosis not present

## 2021-03-05 DIAGNOSIS — N186 End stage renal disease: Secondary | ICD-10-CM | POA: Diagnosis not present

## 2021-03-06 DIAGNOSIS — Z87891 Personal history of nicotine dependence: Secondary | ICD-10-CM | POA: Diagnosis not present

## 2021-03-06 DIAGNOSIS — N186 End stage renal disease: Secondary | ICD-10-CM | POA: Diagnosis not present

## 2021-03-06 DIAGNOSIS — E1122 Type 2 diabetes mellitus with diabetic chronic kidney disease: Secondary | ICD-10-CM | POA: Diagnosis not present

## 2021-03-06 DIAGNOSIS — T85611A Breakdown (mechanical) of intraperitoneal dialysis catheter, initial encounter: Secondary | ICD-10-CM | POA: Diagnosis not present

## 2021-03-06 DIAGNOSIS — I120 Hypertensive chronic kidney disease with stage 5 chronic kidney disease or end stage renal disease: Secondary | ICD-10-CM | POA: Diagnosis not present

## 2021-03-06 DIAGNOSIS — Z992 Dependence on renal dialysis: Secondary | ICD-10-CM | POA: Diagnosis not present

## 2021-03-06 DIAGNOSIS — N185 Chronic kidney disease, stage 5: Secondary | ICD-10-CM | POA: Diagnosis not present

## 2021-03-06 DIAGNOSIS — T8249XA Other complication of vascular dialysis catheter, initial encounter: Secondary | ICD-10-CM | POA: Diagnosis not present

## 2021-03-06 DIAGNOSIS — T82848A Pain from vascular prosthetic devices, implants and grafts, initial encounter: Secondary | ICD-10-CM | POA: Diagnosis not present

## 2021-03-06 DIAGNOSIS — K4090 Unilateral inguinal hernia, without obstruction or gangrene, not specified as recurrent: Secondary | ICD-10-CM | POA: Diagnosis not present

## 2021-03-07 DIAGNOSIS — N186 End stage renal disease: Secondary | ICD-10-CM | POA: Diagnosis not present

## 2021-03-07 DIAGNOSIS — Z992 Dependence on renal dialysis: Secondary | ICD-10-CM | POA: Diagnosis not present

## 2021-03-07 DIAGNOSIS — D509 Iron deficiency anemia, unspecified: Secondary | ICD-10-CM | POA: Diagnosis not present

## 2021-03-07 DIAGNOSIS — Z1159 Encounter for screening for other viral diseases: Secondary | ICD-10-CM | POA: Diagnosis not present

## 2021-03-08 DIAGNOSIS — N186 End stage renal disease: Secondary | ICD-10-CM | POA: Diagnosis not present

## 2021-03-08 DIAGNOSIS — Z992 Dependence on renal dialysis: Secondary | ICD-10-CM | POA: Diagnosis not present

## 2021-03-09 DIAGNOSIS — N186 End stage renal disease: Secondary | ICD-10-CM | POA: Diagnosis not present

## 2021-03-09 DIAGNOSIS — Z992 Dependence on renal dialysis: Secondary | ICD-10-CM | POA: Diagnosis not present

## 2021-03-10 DIAGNOSIS — N186 End stage renal disease: Secondary | ICD-10-CM | POA: Diagnosis not present

## 2021-03-10 DIAGNOSIS — Z992 Dependence on renal dialysis: Secondary | ICD-10-CM | POA: Diagnosis not present

## 2021-03-11 DIAGNOSIS — N186 End stage renal disease: Secondary | ICD-10-CM | POA: Diagnosis not present

## 2021-03-11 DIAGNOSIS — Z992 Dependence on renal dialysis: Secondary | ICD-10-CM | POA: Diagnosis not present

## 2021-03-12 DIAGNOSIS — Z992 Dependence on renal dialysis: Secondary | ICD-10-CM | POA: Diagnosis not present

## 2021-03-12 DIAGNOSIS — N186 End stage renal disease: Secondary | ICD-10-CM | POA: Diagnosis not present

## 2021-03-13 DIAGNOSIS — Z992 Dependence on renal dialysis: Secondary | ICD-10-CM | POA: Diagnosis not present

## 2021-03-13 DIAGNOSIS — N186 End stage renal disease: Secondary | ICD-10-CM | POA: Diagnosis not present

## 2021-03-14 DIAGNOSIS — N186 End stage renal disease: Secondary | ICD-10-CM | POA: Diagnosis not present

## 2021-03-14 DIAGNOSIS — Z992 Dependence on renal dialysis: Secondary | ICD-10-CM | POA: Diagnosis not present

## 2021-03-15 ENCOUNTER — Telehealth: Payer: Self-pay | Admitting: *Deleted

## 2021-03-15 DIAGNOSIS — Z992 Dependence on renal dialysis: Secondary | ICD-10-CM | POA: Diagnosis not present

## 2021-03-15 DIAGNOSIS — N186 End stage renal disease: Secondary | ICD-10-CM | POA: Diagnosis not present

## 2021-03-15 MED ORDER — TRULICITY 1.5 MG/0.5ML ~~LOC~~ SOAJ: 1.5000 mg | SUBCUTANEOUS | 3 refills | Status: DC

## 2021-03-15 NOTE — Telephone Encounter (Signed)
Call placed to patient and patient wife [name] made aware.   Samples given for Trulicity 0.75mg . Prescription sent to pharmacy for Trulcity 1.5mg .

## 2021-03-15 NOTE — Telephone Encounter (Signed)
-----   Message from Susy Frizzle, MD sent at 03/14/2021  5:25 PM EDT ----- Please call the patient.  I spoke with his nephrologist.  Have him discontinue Januvia and replace with Trulicity 2.24 mg subcu weekly.  Increase to 1.5 mg subcu weekly after 2 weeks.  Hopefully this will better control his blood sugars.

## 2021-03-16 ENCOUNTER — Ambulatory Visit: Payer: Medicare Other | Admitting: Cardiology

## 2021-03-16 ENCOUNTER — Other Ambulatory Visit: Payer: Self-pay

## 2021-03-16 ENCOUNTER — Encounter: Payer: Self-pay | Admitting: Cardiology

## 2021-03-16 VITALS — BP 128/70 | HR 68 | Ht 68.0 in | Wt 142.6 lb

## 2021-03-16 DIAGNOSIS — I10 Essential (primary) hypertension: Secondary | ICD-10-CM | POA: Diagnosis not present

## 2021-03-16 DIAGNOSIS — I779 Disorder of arteries and arterioles, unspecified: Secondary | ICD-10-CM | POA: Diagnosis not present

## 2021-03-16 DIAGNOSIS — I5022 Chronic systolic (congestive) heart failure: Secondary | ICD-10-CM | POA: Diagnosis not present

## 2021-03-16 DIAGNOSIS — I2510 Atherosclerotic heart disease of native coronary artery without angina pectoris: Secondary | ICD-10-CM

## 2021-03-16 DIAGNOSIS — N186 End stage renal disease: Secondary | ICD-10-CM | POA: Diagnosis not present

## 2021-03-16 DIAGNOSIS — Z992 Dependence on renal dialysis: Secondary | ICD-10-CM | POA: Diagnosis not present

## 2021-03-16 MED ORDER — SACUBITRIL-VALSARTAN 97-103 MG PO TABS: 1.0000 | ORAL_TABLET | Freq: Two times a day (BID) | ORAL | 6 refills | Status: DC

## 2021-03-16 NOTE — Progress Notes (Signed)
Clinical Summary Bradley Black is a 75 y.o.male  1. CAD - multiple prior interventions with subsequent CABG in 11/2011 with LIMA-LAD, SVG-PDA, SVG-OM2, and SVG-D1  - no recent symptoms    2. Chronic combine systolic/diastolic HF -03/622 echo: LVEF 45% - 07/2020 echo LVEF 25% - 11/2020 echo LVEF 35-40%, grade I dd, mild RV dysfunction.   - not on beta blocker due to bradycardia    - last visit we started entresto 24/26mg  bid, later increased to 49/51mg  bid.  - no SOB/DOE, no recent edema - baseline weight is around 140 lbs.    3. PVCs Recent monitor last year showed an 18% PVC burden and he was ultimately started on Amiodarone 200mg  daily     4. Carotid stenosis 05/2020 RICA mild, left moderate disease   5. ESRD - he is on peritoneal dialysis       Past Medical History:  Diagnosis Date   BPH (benign prostatic hyperplasia)    CAD S/P percutaneous coronary angioplasty    a. PTCA of [place] b. PCI with BMS to LAD in 1997 c. RCA PCI [place] d. s/p CABG in 11/2011 with LIMA-LAD, SVG-PDA, SVG-OM2, and SVG-D1   Chronic back pain    CKD (chronic kidney disease), stage III (HCC)    Cyst of bursa    R shoulder   Diabetic retinopathy ([place])    DM (diabetes mellitus), type 2 with renal complications (HCC)    Essential hypertension    Frequent PVCs    GERD (gastroesophageal reflux disease)    Ischemic cardiomyopathy 11/2011   Intra-OP TEE: EF 40-45%, no regional WMA; improved Anterior WM post CABG.   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     ALPRAZolam (XANAX) 0.5 MG tablet Take 1 tablet  (0.5 mg total) by mouth at bedtime as needed for sleep. 30 tablet 0   amiodarone (PACERONE) 200 MG tablet Take 200 mg by mouth daily.     aspirin EC 81 MG tablet Take 81 mg by mouth daily.     calcitRIOL (ROCALTROL) 0.5 MCG capsule Take 1 capsule (0.5 mcg total) by mouth every Monday, Wednesday, and Friday. 45 capsule 1   Desvenlafaxine Succinate ER 25 MG TB24 Take 1 tablet by mouth daily.     Dulaglutide (TRULICITY) 1.5 JS/2.8BT SOPN Inject 1.5 mg into the skin once a week. 2 mL 3   hydrALAZINE (APRESOLINE) 50 MG tablet Take 1.5 tablets (75 mg total) by mouth 3 (three) times daily. 270 tablet 5   isosorbide mononitrate (IMDUR) 30 MG 24 hr tablet Take 1 tablet (30 mg total) by mouth daily. 30 tablet 0   multivitamin (RENA-VIT) TABS tablet Take 1 tablet by mouth daily.     pantoprazole (PROTONIX) 40 MG tablet TAKE ONE TABLET BY MOUTH TWICE A DAY (Patient taking differently: Take 40 mg by mouth 2 (two) times daily.) 180 tablet 3   pravastatin (PRAVACHOL) 20 MG tablet Take 1 tablet (20 mg total) by mouth daily. 90 tablet 3  sacubitril-valsartan (ENTRESTO) 49-51 MG Take 1 tablet by mouth 2 (two) times daily. 60 tablet 11   No current facility-administered medications for this visit.     Past Surgical History:  Procedure Laterality Date   AV FISTULA PLACEMENT Left 07/28/2020   Procedure: LEFT ARM ARTERIOVENOUS (AV) FISTULA  CREATION;  Surgeon: Rosetta Posner, MD;  Location: [facility];  Service: Vascular;  Laterality: Left;   [place]  2013   CORONARY ANGIOPLASTY  1994   OM   CORONARY ANGIOPLASTY  1997   LAD   CORONARY ANGIOPLASTY WITH STENT PLACEMENT  1999   RCA   CORONARY ANGIOPLASTY WITH STENT PLACEMENT  2001   RCA   CORONARY ARTERY BYPASS GRAFT  12/27/2011   Procedure: CORONARY ARTERY BYPASS GRAFTING (CABG);  Surgeon: Rexene Alberts, MD;  Location: [facility];  Service: Open Heart Surgery;  Laterality: N/A;  Times four. On pump. Using endoscopically harvested  right greater saphenous vein and left internal mammary artery.    HERNIA REPAIR     INCISION AND DRAINAGE OF WOUND  2006   [place]   INSERTION OF DIALYSIS CATHETER Right 07/25/2020   Procedure: INSERTION OF DIALYSIS CATHETER;  Surgeon: Virl Cagey, MD;  Location: [facility];  Service: General;  Laterality: Right;   INTRAOPERATIVE TRANSESOPHAGEAL ECHOCARDIOGRAM  12/27/2011   Global hypokinesis with EF of 40-45%, improved LAD distribution wall motion.   LEFT HEART CATHETERIZATION WITH CORONARY ANGIOGRAM N/A 12/13/2011   Procedure: LEFT HEART CATHETERIZATION WITH CORONARY ANGIOGRAM;  Surgeon: Leonie Man, MD;  Location[facility] C[facility] LAB;  Service: Cardiovascular;  Laterality: N/A;   LUMBAR FUSION     MASS EXCISION  10/24/11   R arm   RIGHT HEART CATH N/A 07/12/2020   Procedure: RIGHT HEART CATH;  Surgeon: Belva Crome, MD;  Location: [facility] CV LAB;  Service: Cardiovascular;  Laterality: N/A;   TRANSESOPHAGEAL ECHOCARDIOGRAM  2013     Allergies  Allergen Reactions   No Known Allergies       Family History  Problem Relation Age of Onset   Cancer Mother        breast   Cancer Father        bone   Macyn cancer Neg Hx      Social History Bradley Black reports that he quit smoking about 34 years ago. His smoking use included cigarettes. He has never used smokeless tobacco. Bradley Black reports no history of alcohol use.   Review of Systems CONSTITUTIONAL: No weight loss, fever, chills, weakness or fatigue.  HEENT: Eyes: No visual loss, blurred vision, double vision or yellow sclerae.No hearing loss, sneezing, congestion, runny nose or sore throat.  SKIN: No rash or itching.  CARDIOVASCULAR: per hpi RESPIRATORY: No shortness of breath, cough or sputum.  GASTROINTESTINAL: No anorexia, nausea, vomiting or diarrhea. No abdominal pain or blood.  GENITOURINARY: No burning on urination, no polyuria NEUROLOGICAL: No headache, dizziness, syncope, paralysis, ataxia, numbness or tingling  in the extremities. No change in bowel or bladder control.  MUSCULOSKELETAL: No muscle, back pain, joint pain or stiffness.  LYMPHATICS: No enlarged nodes. No history of splenectomy.  PSYCHIATRIC: No history of depression or anxiety.  ENDOCRINOLOGIC: No reports of sweating, cold or heat intolerance. No polyuria or polydipsia.  [name]   Physical Examination Today's Vitals   03/16/21 0856  BP: 128/70  Pulse: 68  SpO2: 98%  Weight: 142 lb 9.6 oz (64.7 kg)  Height: 5\' 8"  (1.727 m)  Body mass index is 21.68 kg/m.  Gen: resting comfortably, no acute distress HEENT: no scleral icterus, pupils equal round and reactive, no palptable cervical adenopathy,  CV: RRR, no m/r/g, no jvd Resp: Clear to auscultation bilaterally GI: abdomen is soft, non-tender, non-distended, normal bowel sounds, no hepatosplenomegaly MSK: extremities are warm, no edema.  Skin: warm, no rash Neuro:  no focal deficits Psych: appropriate affect   Diagnostic Studies     Assessment and Plan  1. CAD - no symptoms, continue current meds   2. Chronic systolic HF - no recent symptoms - we will increase entresto to 97/102mg  bid, pending labs at f/u consider adding aldactone - has not been on beta blocker due to bradycardia. [place]ade is contraindicated in ESRD patient.    3. Carotid stenosis - mild to moderate disease - repeat US at next f/u   4. HTN - elevated by home numbers, follow with higher entresto dosing.      6 week f/u.      Arnoldo Lenis, M.D.

## 2021-03-16 NOTE — Patient Instructions (Signed)
Medication Instructions:   INCREASE Entresto to 97-103 mg twice a day  *If you need a refill on your cardiac medications before your next appointment, please call your pharmacy*   Lab Work: None today  If you have labs (blood work) drawn today and your tests are completely normal, you will receive your results only by: [place] (if you have MyChart) OR A paper copy in the mail If you have any lab test that is abnormal or we need to change your treatment, we will call you to review the results.   Testing/Procedures: None today    Follow-Up: At [facility], you and your health needs are our priority.  As part of our continuing mission to provide you with exceptional heart care, we have created designated Provider Care Teams.  These Care Teams include your primary Cardiologist (physician) and Advanced Practice Providers (APPs -  Physician Assistants and Nurse Practitioners) who all work together to provide you with the care you need, when you need it.  We recommend signing up for the patient portal called "MyChart".  Sign up information is provided on this After Visit Summary.  MyChart is used to connect with patients for Virtual Visits (Telemedicine).  Patients are able to view lab/test results, encounter notes, upcoming appointments, etc.  Non-urgent messages can be sent to your provider as well.   To learn more about what you can do with MyChart, go to [email or web address].    Your next appointment:   6 week(s)  The format for your next appointment:   In Person  Provider:   You will see one of the following Advanced Practice Providers on your designated Care Team:   [place], PA-C  Ermalinda Barrios, PA-C      Other Instructions None

## 2021-03-17 DIAGNOSIS — N186 End stage renal disease: Secondary | ICD-10-CM | POA: Diagnosis not present

## 2021-03-17 DIAGNOSIS — Z992 Dependence on renal dialysis: Secondary | ICD-10-CM | POA: Diagnosis not present

## 2021-03-18 DIAGNOSIS — N186 End stage renal disease: Secondary | ICD-10-CM | POA: Diagnosis not present

## 2021-03-18 DIAGNOSIS — Z992 Dependence on renal dialysis: Secondary | ICD-10-CM | POA: Diagnosis not present

## 2021-03-19 DIAGNOSIS — N186 End stage renal disease: Secondary | ICD-10-CM | POA: Diagnosis not present

## 2021-03-19 DIAGNOSIS — Z992 Dependence on renal dialysis: Secondary | ICD-10-CM | POA: Diagnosis not present

## 2021-03-20 ENCOUNTER — Ambulatory Visit (INDEPENDENT_AMBULATORY_CARE_PROVIDER_SITE_OTHER): Payer: Medicare Other | Admitting: *Deleted

## 2021-03-20 ENCOUNTER — Telehealth: Payer: Self-pay | Admitting: *Deleted

## 2021-03-20 DIAGNOSIS — Z992 Dependence on renal dialysis: Secondary | ICD-10-CM | POA: Diagnosis not present

## 2021-03-20 DIAGNOSIS — F339 Major depressive disorder, recurrent, unspecified: Secondary | ICD-10-CM | POA: Diagnosis not present

## 2021-03-20 DIAGNOSIS — N186 End stage renal disease: Secondary | ICD-10-CM | POA: Diagnosis not present

## 2021-03-20 DIAGNOSIS — E1169 Type 2 diabetes mellitus with other specified complication: Secondary | ICD-10-CM

## 2021-03-20 NOTE — Patient Instructions (Signed)
Visit Information  PATIENT GOALS:  Goals Addressed             This Visit's Progress    COMPLETED: Reduce feelings of depression       Timeframe:  Long-Range Goal Priority:  High Start Date:        11/24/2020                     Expected End Date:   02/28/2021                    Follow Up Date 02/07/2021 -continue with new medication regimen at prescribed by PCP -expect call from Pharmacist to discuss medication costs/assistance - avoid negative self-talk - develop a personal safety plan - develop a plan to deal with triggers  - exercise at least 2 to 3 times per week - have a plan for how to handle bad days - journal feelings and what helps to feel better or worse - spend time or talk with others at least 2 to 3 times per week - watch for early signs of feeling sad/depressed and write in journal when this occurs (what, when, how, why, etc)     Why is this important?   Keeping track of your progress will help your treatment team find the right mix of medicine and therapy for you.  Write in your journal every day.  Day-to-day changes in depression symptoms are normal. It may be more helpful to check your progress at the end of each week instead of every day.     Notes:          The patient verbalized understanding of instructions, educational materials, and care plan provided today and declined offer to receive copy of patient instructions, educational materials, and care plan.   The patient has been provided with contact information for the care management team and has been advised to call with any health related questions or concerns.  The patient will call CSW  as advised to seek further assistance if needs arise  Follow up with provider re: depression/meds/ other  No further follow up required from [place] MSW, [place] Licensed Clinical Social Worker [place] [phone]

## 2021-03-20 NOTE — Telephone Encounter (Signed)
Depression screen [facility] 2/9 03/20/2021 03/20/2021 01/09/2021 11/25/2020 11/14/2020  Decreased Interest 2 1 1 1  -  Down, Depressed, Hopeless - 0 1 1 -  PHQ - 2 Score 2 1 2 2  -  Altered sleeping 2 - 1 1 -  Tired, decreased energy 3 - 3 1 2   Change in appetite 2 - 0 0 -  Feeling bad or failure about yourself  1 - 0 1 -  Trouble concentrating 0 - 0 1 -  Moving slowly or fidgety/restless 0 - 2 0 -  Suicidal thoughts 0 - 0 1 -  PHQ-9 Score 10 - 8 7 -  Difficult doing work/chores Somewhat difficult - - Not difficult at all -  Some recent data might be hidden
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
## 2021-03-21 DIAGNOSIS — Z992 Dependence on renal dialysis: Secondary | ICD-10-CM | POA: Diagnosis not present

## 2021-03-21 DIAGNOSIS — N186 End stage renal disease: Secondary | ICD-10-CM | POA: Diagnosis not present

## 2021-03-22 DIAGNOSIS — N186 End stage renal disease: Secondary | ICD-10-CM | POA: Diagnosis not present

## 2021-03-22 DIAGNOSIS — Z992 Dependence on renal dialysis: Secondary | ICD-10-CM | POA: Diagnosis not present

## 2021-03-23 DIAGNOSIS — N186 End stage renal disease: Secondary | ICD-10-CM | POA: Diagnosis not present

## 2021-03-23 DIAGNOSIS — Z992 Dependence on renal dialysis: Secondary | ICD-10-CM | POA: Diagnosis not present

## 2021-03-24 DIAGNOSIS — Z992 Dependence on renal dialysis: Secondary | ICD-10-CM | POA: Diagnosis not present

## 2021-03-24 DIAGNOSIS — N186 End stage renal disease: Secondary | ICD-10-CM | POA: Diagnosis not present

## 2021-03-25 DIAGNOSIS — N186 End stage renal disease: Secondary | ICD-10-CM | POA: Diagnosis not present

## 2021-03-25 DIAGNOSIS — Z992 Dependence on renal dialysis: Secondary | ICD-10-CM | POA: Diagnosis not present

## 2021-03-26 DIAGNOSIS — Z992 Dependence on renal dialysis: Secondary | ICD-10-CM | POA: Diagnosis not present

## 2021-03-26 DIAGNOSIS — N186 End stage renal disease: Secondary | ICD-10-CM | POA: Diagnosis not present

## 2021-03-27 ENCOUNTER — Ambulatory Visit: Payer: Medicare Other | Admitting: Vascular Surgery

## 2021-03-27 DIAGNOSIS — Z992 Dependence on renal dialysis: Secondary | ICD-10-CM | POA: Diagnosis not present

## 2021-03-27 DIAGNOSIS — N186 End stage renal disease: Secondary | ICD-10-CM | POA: Diagnosis not present

## 2021-03-28 DIAGNOSIS — N186 End stage renal disease: Secondary | ICD-10-CM | POA: Diagnosis not present

## 2021-03-28 DIAGNOSIS — Z992 Dependence on renal dialysis: Secondary | ICD-10-CM | POA: Diagnosis not present

## 2021-03-29 ENCOUNTER — Telehealth: Payer: Self-pay | Admitting: *Deleted

## 2021-03-29 DIAGNOSIS — N186 End stage renal disease: Secondary | ICD-10-CM | POA: Diagnosis not present

## 2021-03-29 DIAGNOSIS — Z992 Dependence on renal dialysis: Secondary | ICD-10-CM | POA: Diagnosis not present

## 2021-03-29 NOTE — Telephone Encounter (Signed)
Patient due for Cologuard re-screen.   Order placed via [employer].   Cologuard (Order [id number])

## 2021-03-30 DIAGNOSIS — N186 End stage renal disease: Secondary | ICD-10-CM | POA: Diagnosis not present

## 2021-03-30 DIAGNOSIS — Z992 Dependence on renal dialysis: Secondary | ICD-10-CM | POA: Diagnosis not present

## 2021-03-31 DIAGNOSIS — Z992 Dependence on renal dialysis: Secondary | ICD-10-CM | POA: Diagnosis not present

## 2021-03-31 DIAGNOSIS — N186 End stage renal disease: Secondary | ICD-10-CM | POA: Diagnosis not present

## 2021-03-31 DIAGNOSIS — Z23 Encounter for immunization: Secondary | ICD-10-CM | POA: Diagnosis not present

## 2021-04-01 DIAGNOSIS — Z23 Encounter for immunization: Secondary | ICD-10-CM | POA: Diagnosis not present

## 2021-04-01 DIAGNOSIS — Z992 Dependence on renal dialysis: Secondary | ICD-10-CM | POA: Diagnosis not present

## 2021-04-01 DIAGNOSIS — N186 End stage renal disease: Secondary | ICD-10-CM | POA: Diagnosis not present

## 2021-04-02 DIAGNOSIS — Z23 Encounter for immunization: Secondary | ICD-10-CM | POA: Diagnosis not present

## 2021-04-02 DIAGNOSIS — Z992 Dependence on renal dialysis: Secondary | ICD-10-CM | POA: Diagnosis not present

## 2021-04-02 DIAGNOSIS — N186 End stage renal disease: Secondary | ICD-10-CM | POA: Diagnosis not present

## 2021-04-03 DIAGNOSIS — Z23 Encounter for immunization: Secondary | ICD-10-CM | POA: Diagnosis not present

## 2021-04-03 DIAGNOSIS — Z992 Dependence on renal dialysis: Secondary | ICD-10-CM | POA: Diagnosis not present

## 2021-04-03 DIAGNOSIS — N186 End stage renal disease: Secondary | ICD-10-CM | POA: Diagnosis not present

## 2021-04-04 ENCOUNTER — Telehealth: Payer: Self-pay | Admitting: *Deleted

## 2021-04-04 ENCOUNTER — Telehealth: Payer: Self-pay | Admitting: Cardiology

## 2021-04-04 ENCOUNTER — Other Ambulatory Visit: Payer: Self-pay | Admitting: Family Medicine

## 2021-04-04 DIAGNOSIS — N186 End stage renal disease: Secondary | ICD-10-CM | POA: Diagnosis not present

## 2021-04-04 DIAGNOSIS — Z23 Encounter for immunization: Secondary | ICD-10-CM | POA: Diagnosis not present

## 2021-04-04 DIAGNOSIS — Z992 Dependence on renal dialysis: Secondary | ICD-10-CM | POA: Diagnosis not present

## 2021-04-04 NOTE — Telephone Encounter (Signed)
New message     Pt c/o Shortness Of Breath: STAT if SOB developed within the last 24 hours or pt is noticeably SOB on the phone  1. Are you currently SOB (can you hear that pt is SOB on the phone)? no  2. How long have you been experiencing SOB?  About 3 to 4 days  3. Are you SOB when sitting or when up moving around? Both   4. Are you currently experiencing any other symptoms? Feeling really weak

## 2021-04-04 NOTE — Telephone Encounter (Signed)
Received call from patient wife, [name].   Reports that patient has been having low BP readings. States that over weekend, BP dropped to 70/42. States that dialysis nurse advised to push fluids and have salty snack to increase BP.   Reports that prior to dialysis, BP is elevated around 160/ 80. Reports that after dialysis, BP is low around 110/ 50's.   States that patient has appointment with Cardiology on 04/05/2021.

## 2021-04-04 NOTE — Telephone Encounter (Signed)
Call placed to patient and patient wife [name] made aware.   Agreeable to stopping Cardura.   Will route message to cards.

## 2021-04-04 NOTE — Telephone Encounter (Signed)
Pt denies chest pain or any other symptoms. Pt has appt with ALeonides Sake on 04/05/21 at the [facility] office. Pt aware of appt.

## 2021-04-05 ENCOUNTER — Encounter: Payer: Self-pay | Admitting: Family Medicine

## 2021-04-05 ENCOUNTER — Ambulatory Visit: Payer: Medicare Other | Admitting: Family Medicine

## 2021-04-05 VITALS — BP 138/62 | HR 58 | Ht 68.0 in | Wt 141.6 lb

## 2021-04-05 DIAGNOSIS — I255 Ischemic cardiomyopathy: Secondary | ICD-10-CM | POA: Diagnosis not present

## 2021-04-05 DIAGNOSIS — I6523 Occlusion and stenosis of bilateral carotid arteries: Secondary | ICD-10-CM

## 2021-04-05 DIAGNOSIS — R031 Nonspecific low blood-pressure reading: Secondary | ICD-10-CM

## 2021-04-05 DIAGNOSIS — R531 Weakness: Secondary | ICD-10-CM | POA: Diagnosis not present

## 2021-04-05 DIAGNOSIS — R0602 Shortness of breath: Secondary | ICD-10-CM | POA: Diagnosis not present

## 2021-04-05 DIAGNOSIS — N186 End stage renal disease: Secondary | ICD-10-CM | POA: Diagnosis not present

## 2021-04-05 DIAGNOSIS — Z79899 Other long term (current) drug therapy: Secondary | ICD-10-CM

## 2021-04-05 DIAGNOSIS — Z992 Dependence on renal dialysis: Secondary | ICD-10-CM | POA: Diagnosis not present

## 2021-04-05 DIAGNOSIS — Z23 Encounter for immunization: Secondary | ICD-10-CM | POA: Diagnosis not present

## 2021-04-05 DIAGNOSIS — I493 Ventricular premature depolarization: Secondary | ICD-10-CM

## 2021-04-05 DIAGNOSIS — R5383 Other fatigue: Secondary | ICD-10-CM | POA: Diagnosis not present

## 2021-04-05 MED ORDER — HYDRALAZINE HCL 25 MG PO TABS: 25.0000 mg | ORAL_TABLET | Freq: Three times a day (TID) | ORAL | 3 refills | Status: DC

## 2021-04-05 NOTE — Patient Instructions (Addendum)
Medication Instructions:  Your physician has recommended you make the following change in your medication:  DECREASE Hydralazine to 25 mg tablets THREE TIMES DAILY RESTART night time Entresto 97-103 mg dose  *If you need a refill on your cardiac medications before your next appointment, please call your pharmacy*   Lab Work: We will reach out to Theressa Stamps for upcoming lab work results.  If you have labs (blood work) drawn today and your tests are completely normal, you will receive your results only by: [place] (if you have MyChart) OR A paper copy in the mail If you have any lab test that is abnormal or we need to change your treatment, we will call you to review the results.   Testing/Procedures: None   Follow-Up: At [facility], you and your health needs are our priority.  As part of our continuing mission to provide you with exceptional heart care, we have created designated Provider Care Teams.  These Care Teams include your primary Cardiologist (physician) and Advanced Practice Providers (APPs -  Physician Assistants and Nurse Practitioners) who all work together to provide you with the care you need, when you need it.  We recommend signing up for the patient portal called "MyChart".  Sign up information is provided on this After Visit Summary.  MyChart is used to connect with patients for Virtual Visits (Telemedicine).  Patients are able to view lab/test results, encounter notes, upcoming appointments, etc.  Non-urgent messages can be sent to your provider as well.   To learn more about what you can do with MyChart, go to [email or web address].    Your next appointment:   1 month(s)  The format for your next appointment:   In Person  Provider:   Katina Dung, NP   Other Instructions

## 2021-04-05 NOTE — Addendum Note (Signed)
Addended by: Christella Scheuermann C on: 04/05/2021 11:56 AM   Modules accepted: Orders

## 2021-04-05 NOTE — Progress Notes (Signed)
Cardiology Office Note  Date: 04/05/2021   ID: Burien, DOB May 07, 1946, MRN [id number]  PCP:  Susy Frizzle, MD  Cardiologist:  Carlyle Dolly, MD Electrophysiologist:  Thompson Grayer, MD   Chief Complaint: Shortness of breath and weakness  History of Present Illness: Bradley Black is a 74 y.o. male with a history of CAD, DDD, DM2, frequent PVCs, ischemic cardiomyopathy, carotid artery stenosis, hyperlipidemia, status post CABG x4, BPH, chronic back pain, CKD stage III.   He last saw Dr. Harl Bowie on 03/16/2021 he was having no recent symptoms related to his CAD.  He had been started on Entresto at a prior visit at 24/26 mg p.o. twice daily and later increased it to 49/51 mg p.o. twice daily.  At that time he had no shortness of breath or DOE or recent edema.  Baseline weight was around 140 pounds.  His most recent echocardiogram in February 2022 demonstrated EF of 35 to 40% grade 1 DD, mild RV dysfunction.  He was not on beta-blocker due to bradycardia.  His recent monitor the prior year showed 18% PVC burden and ultimately started on amiodarone 200 mg daily..  Previous carotid artery duplex on August 2021 showed mild R ICA disease and LICA with moderate disease.  He was currently on peritoneal dialysis for ESRD.  During that visit his Delene Loll was increased to 97/103 mg p.o. twice daily.  Dr. Harl Bowie stated pending labs will consider adding Aldactone.  Regarding carotid stenosis he mention repeating ultrasound at next follow-up.  Blood pressure has been elevated by home numbers.  Plans to follow with higher Entresto dosages.  He is here today with complaints weakness, shortness of breath and no energy.  At last visit with Dr. Harl Bowie his Delene Loll was increased to 97/103 p.o. twice daily.  He states since he was started feeling bad he had stopped the evening dose of the Entresto.  He states his blood pressure was getting low with a systolic running in the 782 range at home.  He continues  with his peritoneal dialysis and states he is doing well.  He has an appointment at [facility] renal center tomorrow for blood work.  Advised him to have them send the lab work to [place] to check for electrolyte abnormalities or anemia.  He denies any orthostatic symptoms i.e. lightheadedness, dizziness, presyncope or syncopal episodes.  Denies any PND or orthopnea.  No bleeding.  No claudication or DVT/PE-like symptoms.  No lower extremity edema.   Past Medical History:  Diagnosis Date   BPH (benign prostatic hyperplasia)    CAD S/P percutaneous coronary angioplasty    a. PTCA of [place] b. PCI with BMS to LAD in 1997 c. RCA PCI [place] d. s/p CABG in 11/2011 with LIMA-LAD, SVG-PDA, SVG-OM2, and SVG-D1   Chronic back pain    CKD (chronic kidney disease), stage III (HCC)    Cyst of bursa    R shoulder   Diabetic retinopathy ([place])    DM (diabetes mellitus), type 2 with renal complications (HCC)    Essential hypertension    Frequent PVCs    GERD (gastroesophageal reflux disease)    Ischemic cardiomyopathy 11/2011   Intra-OP TEE: EF 40-45%, no regional WMA; improved Anterior WM post CABG.   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   LIMA to LAD, SVG to D1, SVG to OM2, SVG to PDA, EVH via right thigh  and leg    Past Surgical History:  Procedure Laterality Date   AV FISTULA PLACEMENT Left 07/28/2020   Procedure: LEFT ARM ARTERIOVENOUS (AV) FISTULA  CREATION;  Surgeon: Rosetta Posner, MD;  Location: [facility];  Service: Vascular;  Laterality: Left;   [place]  2013   CORONARY ANGIOPLASTY  1994   OM   CORONARY ANGIOPLASTY  1997   LAD   CORONARY ANGIOPLASTY WITH STENT PLACEMENT  1999   RCA   CORONARY ANGIOPLASTY WITH STENT PLACEMENT  2001   RCA   CORONARY ARTERY BYPASS GRAFT  12/27/2011   Procedure: CORONARY ARTERY BYPASS GRAFTING (CABG);  Surgeon: Rexene Alberts, MD;  Location: [facility];  Service: Open Heart  Surgery;  Laterality: N/A;  Times four. On pump. Using endoscopically harvested right greater saphenous vein and left internal mammary artery.    HERNIA REPAIR     INCISION AND DRAINAGE OF WOUND  2006   [place]   INSERTION OF DIALYSIS CATHETER Right 07/25/2020   Procedure: INSERTION OF DIALYSIS CATHETER;  Surgeon: Virl Cagey, MD;  Location: [facility];  Service: General;  Laterality: Right;   INTRAOPERATIVE TRANSESOPHAGEAL ECHOCARDIOGRAM  12/27/2011   Global hypokinesis with EF of 40-45%, improved LAD distribution wall motion.   LEFT HEART CATHETERIZATION WITH CORONARY ANGIOGRAM N/A 12/13/2011   Procedure: LEFT HEART CATHETERIZATION WITH CORONARY ANGIOGRAM;  Surgeon: Leonie Man, MD;  Location: [facility] CATH LAB;  Service: Cardiovascular;  Laterality: N/A;   LUMBAR FUSION     MASS EXCISION  10/24/11   R arm   RIGHT HEART CATH N/A 07/12/2020   Procedure: RIGHT HEART CATH;  Surgeon: Belva Crome, MD;  Location: [facility] CV LAB;  Service: Cardiovascular;  Laterality: N/A;   TRANSESOPHAGEAL ECHOCARDIOGRAM  2013    Current Outpatient Medications  Medication Sig Dispense Refill   acetaminophen (TYLENOL) 500 MG tablet Take 1,000 mg by mouth every 6 (six) hours as needed for moderate pain or headache.     ALPRAZolam (XANAX) 0.5 MG tablet Take 1 tablet (0.5 mg total) by mouth at bedtime as needed for sleep. 30 tablet 0   amiodarone (PACERONE) 200 MG tablet Take 200 mg by mouth daily.     aspirin EC 81 MG tablet Take 81 mg by mouth daily.     Desvenlafaxine Succinate ER 25 MG TB24 TAKE ONE TABLET (25MG ) BY MOUTH DAILY 30 tablet 3   Dulaglutide (TRULICITY) 1.5 GU/5.4YH SOPN Inject 1.5 mg into the skin once a week. 2 mL 3   hydrALAZINE (APRESOLINE) 25 MG tablet Take 1 tablet (25 mg total) by mouth 3 (three) times daily. 270 tablet 3   isosorbide mononitrate (IMDUR) 30 MG 24 hr tablet Take 1 tablet (30 mg total) by mouth daily. 30 tablet 0   multivitamin (RENA-VIT) TABS tablet Take 1 tablet by  mouth daily.     pantoprazole (PROTONIX) 40 MG tablet TAKE ONE TABLET BY MOUTH TWICE A DAY 180 tablet 3   pravastatin (PRAVACHOL) 20 MG tablet Take 1 tablet (20 mg total) by mouth daily. 90 tablet 3   sacubitril-valsartan (ENTRESTO) 97-103 MG Take 1 tablet by mouth 2 (two) times daily. 60 tablet 6   torsemide (DEMADEX) 20 MG tablet Take 20 mg by mouth daily.     calcitRIOL (ROCALTROL) 0.5 MCG capsule Take 1 capsule (0.5 mcg total) by mouth every Monday, Wednesday, and Friday. (Patient not taking: Reported on 04/05/2021) 45 capsule 1   No current facility-administered  medications for this visit.   Allergies:  No known allergies   Social History: The patient  reports that he quit smoking about 34 years ago. His smoking use included cigarettes. He has never used smokeless tobacco. He reports that he does not drink alcohol and does not use drugs.   Family History: The patient's family history includes Cancer in his father and mother.   ROS:  Please see the history of present illness. Otherwise, complete review of systems is positive for none.  All other systems are reviewed and negative.   Physical Exam: VS:  BP 138/62   Pulse (!) 58   Ht 5\' 8"  (1.727 m)   Wt 141 lb 9.6 oz (64.2 kg)   SpO2 97%   BMI 21.53 kg/m , BMI Body mass index is 21.53 kg/m.  Wt Readings from Last 3 Encounters:  04/05/21 141 lb 9.6 oz (64.2 kg)  03/16/21 142 lb 9.6 oz (64.7 kg)  02/01/21 142 lb 4.8 oz (64.5 kg)    General: Patient appears comfortable at rest. Neck: Supple, no elevated JVP or carotid bruits, no thyromegaly. Lungs: Clear to auscultation, nonlabored breathing at rest. Cardiac: Regular rate and rhythm, no S3 or significant systolic murmur, no pericardial rub. Extremities: No pitting edema, distal pulses 2+. Skin: Warm and dry. Musculoskeletal: No kyphosis. Neuropsychiatric: Alert and oriented x3, affect grossly appropriate.  ECG:  .  EKG on 12/03/2020 sinus bradycardia rate of 52, consider LVH,  inferior arm infarct, old, anterior Q waves possibly due to LVH  Recent Labwork: 07/08/2020: B Natriuretic Peptide >4,500.0 07/21/2020: Magnesium 2.5 12/03/2020: ALT 22; AST 22; BUN 62; Creatinine, Ser 3.87; Hemoglobin 12.1; Platelets 217; Potassium 5.5; Sodium 134     Component Value Date/Time   CHOL 104 07/13/2020 0118   TRIG 62 07/13/2020 0118   HDL 25 (L) 07/13/2020 0118   CHOLHDL 4.2 07/13/2020 0118   VLDL 12 07/13/2020 0118   LDLCALC 67 07/13/2020 0118   LDLCALC 73 05/01/2019 0945    Other Studies Reviewed Today:   Assessment and Plan:  1. Shortness of breath   2. Weakness   3. No energy   4. Decreased blood pressure, not hypotension   5. Medication management   6. PVC's (premature ventricular contractions)   7. Bilateral carotid artery stenosis   8. Ischemic cardiomyopathy    1. Shortness of breath Patient complaining of increased shortness of breath recently.  Currently at rest he has no dyspnea or shortness of breath complaints..  I have ordered basic metabolic panel and CBC and given requisitions to patient in case these are not covered in the lab work he has tomorrow  2. Weakness States he has been feeling weak recently.  He recently stopped his evening dose of Entresto due to feeling weak with some decreased blood pressures.  Patient states he will be having lab work tomorrow at [facility] renal center.  I have ordered basic metabolic panel and CBC and given requisitions to patient in case these are not covered in the lab work he has tomorrow  3. No energy Complaining of no energy/fatigue over the last couple of weeks.  He states he stopped his evening dose of Entresto due to feeling fatigued with no energy.  I have ordered basic metabolic panel and CBC and given requisitions to patient in case these are not covered in the lab work he has tomorrow  4. Decreased blood pressure, not hypotension States he had some recent low blood pressures with systolics in the range  of  109.  He states he stopped the evening dose of his Entresto for this reason.  Blood pressure today 138/62.  I advised him to decrease his dose of hydralazine to 25 mg p.o. 3 times daily  5. Medication management We have decreased his hydralazine dosage to 25 mg p.o. 3 times daily.  He is to restart his evening dose of Entresto which he had stopped independently at home due to low blood pressures and feeling of weakness and shortness of breath with no energy.  6.  PVCs. Continue amiodarone 200 mg daily.  Currently denies any palpitations or arrhythmias.  7.  Carotid stenosis. Please order repeat carotid artery duplex study.  Last study showed mild to moderate bilateral disease  8.  ESRD He states his PD is going well.  He will have repeat lab work tomorrow at [facility] kidney center.  9.  Ischemic cardiomyopathy At last visit his Delene Loll was increased to 97/103 p.o. twice daily.  He had recently stopped the evening dose due to low blood pressures and feeling weak, shortness of breath and low energy.  Advised to restart evening dose of Entresto.  This needs to be taking twice a day.  We lowered the dose of hydralazine to compensate for low blood pressures when he restarts nighttime dose of Entresto  Medication Adjustments/Labs and Tests Ordered: Current medicines are reviewed at length with the patient today.  Concerns regarding medicines are outlined above.   Disposition: Follow-up with Dr. Harl Bowie or APP 1 month  Signed, Levell July, NP 04/05/2021 10:16 AM    [facility], [place], [place] [address] Phone: [phone]; Fax: [phone]

## 2021-04-06 DIAGNOSIS — N186 End stage renal disease: Secondary | ICD-10-CM | POA: Diagnosis not present

## 2021-04-06 DIAGNOSIS — Z992 Dependence on renal dialysis: Secondary | ICD-10-CM | POA: Diagnosis not present

## 2021-04-06 DIAGNOSIS — D509 Iron deficiency anemia, unspecified: Secondary | ICD-10-CM | POA: Diagnosis not present

## 2021-04-06 DIAGNOSIS — Z23 Encounter for immunization: Secondary | ICD-10-CM | POA: Diagnosis not present

## 2021-04-07 ENCOUNTER — Other Ambulatory Visit: Payer: Self-pay | Admitting: *Deleted

## 2021-04-07 DIAGNOSIS — N186 End stage renal disease: Secondary | ICD-10-CM

## 2021-04-07 DIAGNOSIS — Z23 Encounter for immunization: Secondary | ICD-10-CM | POA: Diagnosis not present

## 2021-04-07 DIAGNOSIS — Z992 Dependence on renal dialysis: Secondary | ICD-10-CM | POA: Diagnosis not present

## 2021-04-08 DIAGNOSIS — Z23 Encounter for immunization: Secondary | ICD-10-CM | POA: Diagnosis not present

## 2021-04-08 DIAGNOSIS — Z992 Dependence on renal dialysis: Secondary | ICD-10-CM | POA: Diagnosis not present

## 2021-04-08 DIAGNOSIS — N186 End stage renal disease: Secondary | ICD-10-CM | POA: Diagnosis not present

## 2021-04-09 DIAGNOSIS — Z23 Encounter for immunization: Secondary | ICD-10-CM | POA: Diagnosis not present

## 2021-04-09 DIAGNOSIS — Z992 Dependence on renal dialysis: Secondary | ICD-10-CM | POA: Diagnosis not present

## 2021-04-09 DIAGNOSIS — N186 End stage renal disease: Secondary | ICD-10-CM | POA: Diagnosis not present

## 2021-04-10 ENCOUNTER — Telehealth: Payer: Self-pay | Admitting: *Deleted

## 2021-04-10 ENCOUNTER — Telehealth: Payer: Medicare Other

## 2021-04-10 DIAGNOSIS — N186 End stage renal disease: Secondary | ICD-10-CM | POA: Diagnosis not present

## 2021-04-10 DIAGNOSIS — Z992 Dependence on renal dialysis: Secondary | ICD-10-CM | POA: Diagnosis not present

## 2021-04-10 DIAGNOSIS — Z23 Encounter for immunization: Secondary | ICD-10-CM | POA: Diagnosis not present

## 2021-04-10 NOTE — Telephone Encounter (Signed)
  Care Management   Follow Up Note   04/10/2021 Name: Bradley Black MRN: [id number] DOB: [birth year]   Referred by: Susy Frizzle, MD Reason for referral : Chronic Care Management (HTN, DM2)   An unsuccessful telephone outreach was attempted today. The patient was referred to the case management team for assistance with care management and care coordination.   Follow Up Plan: Telephone follow up appointment with care management team member scheduled for:  upon rescheduling by care guide, in basket sent.   Jacqlyn Larsen RNC, BSN RN Case Manager [facility] [phone]

## 2021-04-10 NOTE — Telephone Encounter (Signed)
  Care Management   Follow Up Note   04/10/2021 Name: Bradley Black MRN: [id number] DOB: 07/26/1946   Referred by: Susy Frizzle, MD Reason for referral : Chronic Care Management (HTN, DM2)   A second unsuccessful telephone outreach was attempted today. The patient was referred to the case management team for assistance with care management and care coordination.     Follow Up Plan: Telephone follow up appointment with care management team member scheduled for:  upon rescheduling by care guide.  Jacqlyn Larsen RNC, BSN RN Case Manager [facility] [phone]

## 2021-04-11 DIAGNOSIS — Z992 Dependence on renal dialysis: Secondary | ICD-10-CM | POA: Diagnosis not present

## 2021-04-11 DIAGNOSIS — Z23 Encounter for immunization: Secondary | ICD-10-CM | POA: Diagnosis not present

## 2021-04-11 DIAGNOSIS — N186 End stage renal disease: Secondary | ICD-10-CM | POA: Diagnosis not present

## 2021-04-12 DIAGNOSIS — N186 End stage renal disease: Secondary | ICD-10-CM | POA: Diagnosis not present

## 2021-04-12 DIAGNOSIS — Z23 Encounter for immunization: Secondary | ICD-10-CM | POA: Diagnosis not present

## 2021-04-12 DIAGNOSIS — Z992 Dependence on renal dialysis: Secondary | ICD-10-CM | POA: Diagnosis not present

## 2021-04-13 DIAGNOSIS — N186 End stage renal disease: Secondary | ICD-10-CM | POA: Diagnosis not present

## 2021-04-13 DIAGNOSIS — Z23 Encounter for immunization: Secondary | ICD-10-CM | POA: Diagnosis not present

## 2021-04-13 DIAGNOSIS — Z992 Dependence on renal dialysis: Secondary | ICD-10-CM | POA: Diagnosis not present

## 2021-04-14 DIAGNOSIS — N186 End stage renal disease: Secondary | ICD-10-CM | POA: Diagnosis not present

## 2021-04-14 DIAGNOSIS — Z992 Dependence on renal dialysis: Secondary | ICD-10-CM | POA: Diagnosis not present

## 2021-04-14 DIAGNOSIS — Z23 Encounter for immunization: Secondary | ICD-10-CM | POA: Diagnosis not present

## 2021-04-15 DIAGNOSIS — N186 End stage renal disease: Secondary | ICD-10-CM | POA: Diagnosis not present

## 2021-04-15 DIAGNOSIS — Z23 Encounter for immunization: Secondary | ICD-10-CM | POA: Diagnosis not present

## 2021-04-15 DIAGNOSIS — Z992 Dependence on renal dialysis: Secondary | ICD-10-CM | POA: Diagnosis not present

## 2021-04-16 DIAGNOSIS — Z992 Dependence on renal dialysis: Secondary | ICD-10-CM | POA: Diagnosis not present

## 2021-04-16 DIAGNOSIS — Z23 Encounter for immunization: Secondary | ICD-10-CM | POA: Diagnosis not present

## 2021-04-16 DIAGNOSIS — N186 End stage renal disease: Secondary | ICD-10-CM | POA: Diagnosis not present

## 2021-04-17 ENCOUNTER — Ambulatory Visit (HOSPITAL_COMMUNITY)
Admission: RE | Admit: 2021-04-17 | Discharge: 2021-04-17 | Disposition: A | Discharge status: Home / Self Care | Payer: Medicare Other | Source: Ambulatory Visit | Attending: Vascular Surgery | Admitting: Vascular Surgery

## 2021-04-17 ENCOUNTER — Other Ambulatory Visit: Payer: Self-pay

## 2021-04-17 DIAGNOSIS — Z992 Dependence on renal dialysis: Secondary | ICD-10-CM

## 2021-04-17 DIAGNOSIS — N186 End stage renal disease: Secondary | ICD-10-CM | POA: Insufficient documentation

## 2021-04-17 DIAGNOSIS — Z23 Encounter for immunization: Secondary | ICD-10-CM | POA: Diagnosis not present

## 2021-04-18 DIAGNOSIS — N186 End stage renal disease: Secondary | ICD-10-CM | POA: Diagnosis not present

## 2021-04-18 DIAGNOSIS — Z992 Dependence on renal dialysis: Secondary | ICD-10-CM | POA: Diagnosis not present

## 2021-04-18 DIAGNOSIS — Z23 Encounter for immunization: Secondary | ICD-10-CM | POA: Diagnosis not present

## 2021-04-19 ENCOUNTER — Other Ambulatory Visit: Payer: Self-pay

## 2021-04-19 ENCOUNTER — Encounter: Payer: Self-pay | Admitting: Vascular Surgery

## 2021-04-19 ENCOUNTER — Ambulatory Visit (INDEPENDENT_AMBULATORY_CARE_PROVIDER_SITE_OTHER): Payer: Medicare Other | Admitting: Vascular Surgery

## 2021-04-19 ENCOUNTER — Telehealth: Payer: Self-pay

## 2021-04-19 VITALS — BP 188/81 | HR 65 | Temp 98.2°F | Ht 68.0 in | Wt 143.6 lb

## 2021-04-19 DIAGNOSIS — Z79899 Other long term (current) drug therapy: Secondary | ICD-10-CM

## 2021-04-19 DIAGNOSIS — Z992 Dependence on renal dialysis: Secondary | ICD-10-CM

## 2021-04-19 DIAGNOSIS — N186 End stage renal disease: Secondary | ICD-10-CM

## 2021-04-19 DIAGNOSIS — Z23 Encounter for immunization: Secondary | ICD-10-CM | POA: Diagnosis not present

## 2021-04-19 NOTE — Telephone Encounter (Signed)
Spoke to pt's wife who stated that pt's bp is starting to trend upwards again. She stated pt will wake up in the morning and say he feels great and by mid afternoon say that he feels like he has been hit by a truck. Pt's wife stated pt will come and get lab work drawn at [facility].

## 2021-04-19 NOTE — Progress Notes (Signed)
Vascular and Vein Specialist of [place]  Patient name: Bradley Black MRN: [id number] DOB: 1946-05-24 Sex: male  REASON FOR VISIT: Discuss access for hemodialysis  HPI: Bradley Black is a 75 y.o. male here today for discussion of access for hemodialysis.  I initially met the patient while he was an inpatient at [facility].  He had progressed to acute renal failure.  He had a tunneled hemodialysis catheter placed was consulted and created a left radiocephalic AV fistula.  This was on 07/28/2020.  Saw him 1 month following this and his fistula looked quite good with a good thrill present and good Akiya Morr size maturation.  He subsequently underwent placement of a peritoneal dialysis catheter and has been using peritoneal dialysis successfully.  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  He is left-handed.  Past Medical History:  Diagnosis Date   BPH (benign prostatic hyperplasia)    CAD S/P percutaneous coronary angioplasty    a. PTCA of [place] b. PCI with BMS to LAD in 1997 c. RCA PCI [place] d. s/p CABG in 11/2011 with LIMA-LAD, SVG-PDA, SVG-OM2, and SVG-D1   Chronic back pain    CKD (chronic kidney disease), stage III (HCC)    Cyst of bursa    R shoulder   Diabetic retinopathy ([place])    DM (diabetes mellitus), type 2 with renal complications (HCC)    Essential hypertension    Frequent PVCs    GERD (gastroesophageal reflux disease)    Ischemic cardiomyopathy 11/2011   Intra-OP TEE: EF 40-45%, no regional WMA; improved Anterior WM post CABG.   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   LIMA to LAD, SVG to D1, SVG to OM2, SVG to PDA, EVH via right thigh and leg    Family History  Problem Relation Age of Onset   Cancer Mother        breast   Cancer Father        bone   Travarius cancer Neg Hx     SOCIAL HISTORY: Social History    Tobacco Use   Smoking status: Former    Types: Cigarettes    Quit date: 10/01/1986    Years since quitting: 34.5   Smokeless tobacco: Never   Tobacco comments:    quit about 30 yrs ago  Substance Use Topics   Alcohol use: No    Allergies  Allergen Reactions   No Known Allergies     Current Outpatient Medications  Medication Sig Dispense Refill   acetaminophen (TYLENOL) 500 MG tablet Take 1,000 mg by mouth every 6 (six) hours as needed for moderate pain or headache.     ALPRAZolam (XANAX) 0.5 MG tablet Take 1 tablet (0.5 mg total) by mouth at bedtime as needed for sleep. 30 tablet 0   amiodarone (PACERONE) 200 MG tablet Take 200 mg by mouth daily.     aspirin EC 81 MG tablet Take 81 mg by mouth daily.     Desvenlafaxine Succinate ER 25 MG TB24 TAKE ONE TABLET (25MG) BY MOUTH DAILY 30 tablet 3   Dulaglutide (TRULICITY) 1.5 JI/9.6VE SOPN Inject 1.5 mg into the skin once a week. 2 mL 3   hydrALAZINE (APRESOLINE) 25 MG tablet Take 1 tablet (25 mg total) by mouth 3 (three) times daily. 270 tablet 3   isosorbide mononitrate (IMDUR) 30 MG  24 hr tablet Take 1 tablet (30 mg total) by mouth daily. 30 tablet 0   multivitamin (RENA-VIT) TABS tablet Take 1 tablet by mouth daily.     pantoprazole (PROTONIX) 40 MG tablet TAKE ONE TABLET BY MOUTH TWICE A DAY 180 tablet 3   pravastatin (PRAVACHOL) 20 MG tablet Take 1 tablet (20 mg total) by mouth daily. 90 tablet 3   sacubitril-valsartan (ENTRESTO) 97-103 MG Take 1 tablet by mouth 2 (two) times daily. 60 tablet 6   torsemide (DEMADEX) 20 MG tablet Take 20 mg by mouth daily.     calcitRIOL (ROCALTROL) 0.5 MCG capsule Take 1 capsule (0.5 mcg total) by mouth every Monday, Wednesday, and Friday. (Patient not taking: No sig reported) 45 capsule 1   No current facility-administered medications for this visit.    REVIEW OF SYSTEMS:  '[X]'  denotes positive finding, '[ ]'  denotes negative finding Cardiac  Comments:  Chest pain or chest pressure:     Shortness of breath upon exertion:    Short of breath when lying flat:    Irregular heart rhythm:        Vascular    Pain in calf, thigh, or hip brought on by ambulation:    Pain in feet at night that wakes you up from your sleep:     Blood clot in your veins:    Leg swelling:           PHYSICAL EXAM: Vitals:   04/19/21 1300  BP: (!) 188/81  Pulse: 65  Temp: 98.2 F (36.8 C)  TempSrc: Oral  SpO2: 97%  Weight: 143 lb 9.6 oz (65.1 kg)  Height: '5\' 8"'  (1.727 m)    GENERAL: The patient is a well-nourished male, in no acute distress. The vital signs are documented above. 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  No thrill present.  He does have palpable radial pulses bilaterally.  Small surface veins. PULMONARY: There is good air exchange  MUSCULOSKELETAL: There are no major deformities or cyanosis. NEUROLOGIC: No focal weakness or paresthesias are detected. SKIN: There are no ulcers or rashes noted. PSYCHIATRIC: The patient has a normal affect.  DATA:  He underwent duplex on 04/17/2021.  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  He does have small cephalic and basilic veins bilaterally.  I imaged his cephalic and basilic veins today with SonoSite ultrasound.  He does have very small veins bilaterally  MEDICAL ISSUES: Had long discussion with the patient regarding options.  I do not feel that he is a candidate for additional AV fistula attempts.  His best vein was his left forearm cephalic and this is thrombosed.  He is actually thrombosed his upper arm cephalic by SonoSite ultrasound as well.  I did discuss the use of prosthetic Gore-Tex graft.  I personally would defer placement of this due to its known frequent thromboses.  Hemodialysis is being proposed as a "backup" for his peritoneal dialysis.  I am concerned that he would have frequent thromboses of a AV graft.  I discussed the option of catheter placement should he have failure of his PD.  We could  then place a prosthetic graft and begin using the graft within 1 month.  He will discuss this with his nephrologist at next visit as well.  I will forward this to Dr.Kolluru    Rosetta Posner, MD [facility] V[facility] and Vein Specialists of [facility] [phone]  Note: Portions of this report may have been transcribed using voice recognition software.  Every effort has been made to ensure accuracy; however, inadvertent computerized transcription errors may still be present.

## 2021-04-19 NOTE — Telephone Encounter (Signed)
-----   Message from Verta Ellen., NP sent at 04/19/2021  7:36 AM EDT ----- Regarding: Labs. Would you call him and ask him if his symptoms have improved since being seen. If not, ask him if he is willing to get a thyroid panel with TSH, T3 , T4. Vitamin B12 and Vitamin D and send him for these labs  Thanks ----- Message ----- From: Laurine Blazer, LPN Sent: 6/80/3212   5:24 PM EDT To: Verta Ellen., NP  The only labs I did not see which could have an effect on making him tired which I think I ordered were thyroid labs ; TSH, T3, T4. and Vitamin D and B12. Can you check on those and see ? Thanks -------------------------------------------------------------------------------------------------------------------- Does not look like you ordered labs stated above.  Dictation does mention BMET & CBC which was included in the labs that was done by [facility].

## 2021-04-20 ENCOUNTER — Other Ambulatory Visit (HOSPITAL_COMMUNITY)
Admission: RE | Admit: 2021-04-20 | Discharge: 2021-04-20 | Disposition: A | Discharge status: Home / Self Care | Payer: Medicare Other | Source: Ambulatory Visit | Attending: Family Medicine | Admitting: Family Medicine

## 2021-04-20 DIAGNOSIS — N186 End stage renal disease: Secondary | ICD-10-CM | POA: Diagnosis not present

## 2021-04-20 DIAGNOSIS — Z79899 Other long term (current) drug therapy: Secondary | ICD-10-CM | POA: Diagnosis not present

## 2021-04-20 DIAGNOSIS — Z992 Dependence on renal dialysis: Secondary | ICD-10-CM | POA: Diagnosis not present

## 2021-04-20 DIAGNOSIS — Z23 Encounter for immunization: Secondary | ICD-10-CM | POA: Diagnosis not present

## 2021-04-20 LAB — CBC
HCT: 37 % — ABNORMAL LOW (ref 39.0–52.0)
Hemoglobin: 12.1 g/dL — ABNORMAL LOW (ref 13.0–17.0)
MCH: 34 pg (ref 26.0–34.0)
MCHC: 32.7 g/dL (ref 30.0–36.0)
MCV: 103.9 fL — ABNORMAL HIGH (ref 80.0–100.0)
Platelets: 230 10*3/uL (ref 150–400)
RBC: 3.56 MIL/uL — ABNORMAL LOW (ref 4.22–5.81)
RDW: 13.2 % (ref 11.5–15.5)
WBC: 8.8 10*3/uL (ref 4.0–10.5)
nRBC: 0 % (ref 0.0–0.2)

## 2021-04-20 LAB — MAGNESIUM: Magnesium: 2.2 mg/dL (ref 1.7–2.4)

## 2021-04-20 LAB — BASIC METABOLIC PANEL
Anion gap: 7 (ref 5–15)
BUN: 32 mg/dL — ABNORMAL HIGH (ref 8–23)
CO2: 27 mmol/L (ref 22–32)
Calcium: 9.7 mg/dL (ref 8.9–10.3)
Chloride: 102 mmol/L (ref 98–111)
Creatinine, Ser: 3.18 mg/dL — ABNORMAL HIGH (ref 0.61–1.24)
GFR, Estimated: 19 mL/min — ABNORMAL LOW (ref >60)
Glucose, Bld: 129 mg/dL — ABNORMAL HIGH (ref 70–99)
Potassium: 4.2 mmol/L (ref 3.5–5.1)
Sodium: 136 mmol/L (ref 135–145)

## 2021-04-20 LAB — T4, FREE: Free T4: 1.18 ng/dL — ABNORMAL HIGH (ref 0.61–1.12)

## 2021-04-20 LAB — TSH: TSH: 8.766 u[IU]/mL — ABNORMAL HIGH (ref 0.350–4.500)

## 2021-04-20 LAB — VITAMIN B12: Vitamin B-12: 486 pg/mL (ref 180–914)

## 2021-04-20 LAB — VITAMIN D 25 HYDROXY (VIT D DEFICIENCY, FRACTURES): Vit D, 25-Hydroxy: 23.2 ng/mL — ABNORMAL LOW (ref 30–100)

## 2021-04-21 ENCOUNTER — Telehealth: Payer: Self-pay | Admitting: *Deleted

## 2021-04-21 DIAGNOSIS — Z23 Encounter for immunization: Secondary | ICD-10-CM | POA: Diagnosis not present

## 2021-04-21 DIAGNOSIS — N186 End stage renal disease: Secondary | ICD-10-CM | POA: Diagnosis not present

## 2021-04-21 DIAGNOSIS — Z992 Dependence on renal dialysis: Secondary | ICD-10-CM | POA: Diagnosis not present

## 2021-04-21 LAB — T3: T3, Total: 69 ng/dL — ABNORMAL LOW (ref 71–180)

## 2021-04-21 NOTE — Chronic Care Management (AMB) (Signed)
  Care Management   Note  04/21/2021 Name: Bradley Black MRN: [id number] DOB: December 10, 1945  Bradley Black is a 75 y.o. year old male who is a primary care patient of Dennard Schaumann, [name], MD and is actively engaged with the care management team. I reached out to [place] by phone today to assist with re-scheduling a follow up visit with the RN Case Manager  Follow up plan: Telephone appointment with care management team member scheduled for:04/24/2021  Riyanna Crutchley  Care Guide, [place] [facility]  Direct Dial: [phone]

## 2021-04-21 NOTE — Chronic Care Management (AMB) (Signed)
  Care Management   Note  04/21/2021 Name: Bradley Black MRN: [id number] DOB: 1946/02/26  [name] is a 75 y.o. year old male who is a primary care patient of Dennard Schaumann, [name], MD and is actively engaged with the care management team. I reached out to [place] by phone today to assist with re-scheduling a follow up visit with the RN Case Manager  Follow up plan: Unsuccessful telephone outreach attempt made. A HIPAA compliant phone message was left for the patient providing contact information and requesting a return call.  The care management team will reach out to the patient again over the next 7 days.  If patient returns call to provider office, please advise to call [place] at [facility]  Direct Dial: [phone]

## 2021-04-22 DIAGNOSIS — N186 End stage renal disease: Secondary | ICD-10-CM | POA: Diagnosis not present

## 2021-04-22 DIAGNOSIS — Z992 Dependence on renal dialysis: Secondary | ICD-10-CM | POA: Diagnosis not present

## 2021-04-22 DIAGNOSIS — Z23 Encounter for immunization: Secondary | ICD-10-CM | POA: Diagnosis not present

## 2021-04-23 DIAGNOSIS — Z992 Dependence on renal dialysis: Secondary | ICD-10-CM | POA: Diagnosis not present

## 2021-04-23 DIAGNOSIS — N186 End stage renal disease: Secondary | ICD-10-CM | POA: Diagnosis not present

## 2021-04-23 DIAGNOSIS — Z23 Encounter for immunization: Secondary | ICD-10-CM | POA: Diagnosis not present

## 2021-04-24 ENCOUNTER — Telehealth: Payer: Self-pay | Admitting: *Deleted

## 2021-04-24 ENCOUNTER — Telehealth: Payer: Medicare Other

## 2021-04-24 DIAGNOSIS — N186 End stage renal disease: Secondary | ICD-10-CM | POA: Diagnosis not present

## 2021-04-24 DIAGNOSIS — Z23 Encounter for immunization: Secondary | ICD-10-CM | POA: Diagnosis not present

## 2021-04-24 DIAGNOSIS — Z992 Dependence on renal dialysis: Secondary | ICD-10-CM | POA: Diagnosis not present

## 2021-04-24 NOTE — Telephone Encounter (Signed)
  Care Management   Follow Up Note   04/24/2021 Name: Bradley Black MRN: [id number] DOB: April 25, 1946   Referred by: Susy Frizzle, MD Reason for referral : Chronic Care Management (DM, HTN)   A second unsuccessful telephone outreach was attempted today. The patient was referred to the case management team for assistance with care management and care coordination.   Follow Up Plan: Telephone follow up appointment with care management team member scheduled for:  upon rescheduling by Care Guide.  Jacqlyn Larsen RNC, BSN RN Case Manager [facility] [phone]

## 2021-04-25 DIAGNOSIS — Z992 Dependence on renal dialysis: Secondary | ICD-10-CM | POA: Diagnosis not present

## 2021-04-25 DIAGNOSIS — Z23 Encounter for immunization: Secondary | ICD-10-CM | POA: Diagnosis not present

## 2021-04-25 DIAGNOSIS — N186 End stage renal disease: Secondary | ICD-10-CM | POA: Diagnosis not present

## 2021-04-26 DIAGNOSIS — Z992 Dependence on renal dialysis: Secondary | ICD-10-CM | POA: Diagnosis not present

## 2021-04-26 DIAGNOSIS — Z23 Encounter for immunization: Secondary | ICD-10-CM | POA: Diagnosis not present

## 2021-04-26 DIAGNOSIS — N186 End stage renal disease: Secondary | ICD-10-CM | POA: Diagnosis not present

## 2021-04-27 DIAGNOSIS — Z23 Encounter for immunization: Secondary | ICD-10-CM | POA: Diagnosis not present

## 2021-04-27 DIAGNOSIS — Z992 Dependence on renal dialysis: Secondary | ICD-10-CM | POA: Diagnosis not present

## 2021-04-27 DIAGNOSIS — N186 End stage renal disease: Secondary | ICD-10-CM | POA: Diagnosis not present

## 2021-04-28 ENCOUNTER — Telehealth: Payer: Self-pay | Admitting: *Deleted

## 2021-04-28 DIAGNOSIS — Z23 Encounter for immunization: Secondary | ICD-10-CM | POA: Diagnosis not present

## 2021-04-28 DIAGNOSIS — Z992 Dependence on renal dialysis: Secondary | ICD-10-CM | POA: Diagnosis not present

## 2021-04-28 DIAGNOSIS — N186 End stage renal disease: Secondary | ICD-10-CM | POA: Diagnosis not present

## 2021-04-28 NOTE — Telephone Encounter (Signed)
Laurine Blazer, LPN  0/48/8891  6:94 PM EDT Back to Top     Notified patient & wife [name]).  Copy to pcp.   They will wait till office visit on 05/04/2021 to make these changes after discussing in further detail withprovider.    Verta Ellen., NP  04/22/2021  7:50 PM EDT      His T3 is a little lower than low normal. It is 69. The low end of normal is 71.   Verta Ellen, NP  04/22/2021 7:48 PM    Verta Ellen., NP  04/20/2021  6:26 PM EDT      I replied to TSH result earlier. T4 is slightly above normal Verta Ellen, NP  04/20/2021 6:24 PM    Verta Ellen., NP  04/20/2021  5:31 PM EDT      Please call the patient and let him know the Lab tests show he has low thyroid and low vitamin D. This is the reason he feels so tired and fatigued.  We need to start him on Levothroxine 25 mcg po daily. His Vitamin D is low and he needs to start on Vitamin D supplement. Please start him on Cholecalciferol 25 mcg po daily. We need to stop the Amiodarone. Let him know that Amiodarone can sometimes affect the function of the thyroid gland. The may not be the reason for the low thyroid but we will stop it just to be safe. We will replace it with Diltiazem CD 90 mg po daily for treatment of PVC's. Once he starts the the thyroid medication he will need a repeat TSH, T3, T4 levels in 6 weeks. He will need to follow up with his PCP for further adjustment of the thyroid and and vitamin D.  He will need follow up in 6 to 8 weeks with [place] after stopping theAmiodarone and starting the Cardizem.   Verta Ellen, NP  04/20/2021 5:28 PM

## 2021-04-29 DIAGNOSIS — N186 End stage renal disease: Secondary | ICD-10-CM | POA: Diagnosis not present

## 2021-04-29 DIAGNOSIS — Z992 Dependence on renal dialysis: Secondary | ICD-10-CM | POA: Diagnosis not present

## 2021-04-29 DIAGNOSIS — Z23 Encounter for immunization: Secondary | ICD-10-CM | POA: Diagnosis not present

## 2021-04-30 DIAGNOSIS — Z23 Encounter for immunization: Secondary | ICD-10-CM | POA: Diagnosis not present

## 2021-04-30 DIAGNOSIS — Z992 Dependence on renal dialysis: Secondary | ICD-10-CM | POA: Diagnosis not present

## 2021-04-30 DIAGNOSIS — N186 End stage renal disease: Secondary | ICD-10-CM | POA: Diagnosis not present

## 2021-05-01 ENCOUNTER — Ambulatory Visit: Payer: Medicare Other | Admitting: Physician Assistant

## 2021-05-01 ENCOUNTER — Telehealth: Payer: Self-pay | Admitting: *Deleted

## 2021-05-01 DIAGNOSIS — Z992 Dependence on renal dialysis: Secondary | ICD-10-CM | POA: Diagnosis not present

## 2021-05-01 DIAGNOSIS — N186 End stage renal disease: Secondary | ICD-10-CM | POA: Diagnosis not present

## 2021-05-01 NOTE — Telephone Encounter (Signed)
Call placed to patient. LMTRC.  

## 2021-05-01 NOTE — Telephone Encounter (Signed)
-----   Message from Susy Frizzle, MD sent at 05/01/2021  6:26 AM EDT ----- Patient is losing weight.  Reduce trulicity to lowest dose 0.75 mg sq weekly and see if this helps.  ----- Message ----- From: Lavonia Dana, MD Sent: 04/28/2021   1:18 PM EDT To: Susy Frizzle, MD  I saw our mutual patient earlier this week. He has lost a significant amount of weight. He states he has no appetite. Upon further discussion, it seems that the high dose of Trulicity may be causing this. I would recommend his going back down on his dose if possible.   Thanks  Lavonia Dana, MD

## 2021-05-02 DIAGNOSIS — Z992 Dependence on renal dialysis: Secondary | ICD-10-CM | POA: Diagnosis not present

## 2021-05-02 DIAGNOSIS — N186 End stage renal disease: Secondary | ICD-10-CM | POA: Diagnosis not present

## 2021-05-02 MED ORDER — TRULICITY 0.75 MG/0.5ML ~~LOC~~ SOAJ: 0.7500 mg | SUBCUTANEOUS | 11 refills | Status: DC

## 2021-05-02 NOTE — Telephone Encounter (Signed)
Patient returned call and made aware.   Agreeable to plan.   Prescription sent to pharmacy.

## 2021-05-03 DIAGNOSIS — Z992 Dependence on renal dialysis: Secondary | ICD-10-CM | POA: Diagnosis not present

## 2021-05-03 DIAGNOSIS — N186 End stage renal disease: Secondary | ICD-10-CM | POA: Diagnosis not present

## 2021-05-03 NOTE — Progress Notes (Signed)
Cardiology Office Note  Date: 05/04/2021   ID: Bradley Black, DOB 09/04/1946, MRN [id number]  PCP:  Susy Frizzle, MD  Cardiologist:  Carlyle Dolly, MD Electrophysiologist:  Thompson Grayer, MD   Chief Complaint: Shortness of breath and weakness  History of Present Illness: Bradley Black is a 75 y.o. male with a history of CAD, DDD, DM2, frequent PVCs, ischemic cardiomyopathy, carotid artery stenosis, hyperlipidemia, status post CABG x4, BPH, chronic back pain, ESRD, on PD hypothyroidism.    He last saw Dr. Harl Bowie on 03/16/2021 he was having no recent symptoms related to his CAD.  He had been started on Entresto at a prior visit at 24/26 mg p.o. twice daily and later increased it to 49/51 mg p.o. twice daily.  At that time he had no shortness of breath or DOE or recent edema.  Baseline weight was around 140 pounds.  His most recent echocardiogram in February 2022 demonstrated EF of 35 to 40% grade 1 DD, mild RV dysfunction.  He was not on beta-blocker due to bradycardia.  His recent monitor the prior year showed 18% PVC burden and ultimately started on amiodarone 200 mg daily..  Previous carotid artery duplex on August 2021 showed mild R ICA disease and LICA with moderate disease.  He was currently on peritoneal dialysis for ESRD.  During that visit his Bradley Black was increased to 97/103 mg p.o. twice daily.  Dr. Harl Bowie stated pending labs will consider adding Aldactone.  Regarding carotid stenosis he mention repeating ultrasound at next follow-up.  Blood pressure has been elevated by home numbers.  Plans to follow with higher Entresto dosages.  He is here today with complaints weakness, shortness of breath and no energy.  At last visit with Dr. Harl Bowie his Bradley Black was increased to 97/103 p.o. twice daily.  He states since he was started feeling bad he had stopped the evening dose of the Entresto.  He states his blood pressure was getting low with a systolic running in the 086 range at home.   He continues with his peritoneal dialysis and states he is doing well.  He has an appointment at [facility] renal [facility] tomorrow for blood work.  Advised him to have them send the lab work to [place] to check for electrolyte abnormalities or anemia.  He denies any orthostatic symptoms i.e. lightheadedness, dizziness, presyncope or syncopal episodes.  Denies any PND or orthopnea.  No bleeding.  No claudication or DVT/PE-like symptoms.  No lower extremity edema.   He is here for follow-up today.  At last visit we ordered lab work due to his fatigue. His thyroid function tests were abnormal. TSH 8.766 , T3 69, T4 1.18.  Plans were to stop amiodarone and start Cardizem.  Plan was to start levothyroxine 25 mcg p.o. daily.  Vitamin D was low.  Plan was to start cholecalciferol 25 mcg daily.  Hgb / Hct 12.1 / 37.0. Glucose was 136.  He was notified by nursing staff on 04/28/2021.  Patient and wife wanted to wait until follow-up visit today before starting the medication to discuss.  Patient states he recently saw Dr. Donnetta Hutching who wanted to place AV fistula but stated blood vessels were not amenable to placing.  States he still feels tired.    Past Medical History:  Diagnosis Date   BPH (benign prostatic hyperplasia)    CAD S/P percutaneous coronary angioplasty    a. PTCA of [place] b. PCI with BMS to LAD in 1997 c.  RCA PCI BMS 1999 & 2000 d. s/p CABG in 11/2011 with LIMA-LAD, SVG-PDA, SVG-OM2, and SVG-D1   Chronic back pain    CKD (chronic kidney disease), stage III (HCC)    Cyst of bursa    R shoulder   Diabetic retinopathy ([place])    DM (diabetes mellitus), type 2 with renal complications (HCC)    Essential hypertension    Frequent PVCs    GERD (gastroesophageal reflux disease)    Ischemic cardiomyopathy 11/2011   Intra-OP TEE: EF 40-45%, no regional WMA; improved Anterior WM post CABG.   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   LIMA to LAD, SVG to D1, SVG to OM2,  SVG to PDA, EVH via right thigh and leg    Past Surgical History:  Procedure Laterality Date   AV FISTULA PLACEMENT Left 07/28/2020   Procedure: LEFT ARM ARTERIOVENOUS (AV) FISTULA  CREATION;  Surgeon: Rosetta Posner, MD;  Location: [facility];  Service: Vascular;  Laterality: Left;   [place]  2013   CORONARY ANGIOPLASTY  1994   OM   CORONARY ANGIOPLASTY  1997   LAD   CORONARY ANGIOPLASTY WITH STENT PLACEMENT  1999   RCA   CORONARY ANGIOPLASTY WITH STENT PLACEMENT  2001   RCA   CORONARY ARTERY BYPASS GRAFT  12/27/2011   Procedure: CORONARY ARTERY BYPASS GRAFTING (CABG);  Surgeon: Rexene Alberts, MD;  Location: [place];  Service: Open Heart Surgery;  Laterality: N/A;  Times four. On pump. Using endoscopically harvested right greater saphenous vein and left internal mammary artery.    HERNIA REPAIR     INCISION AND DRAINAGE OF WOUND  2006   [place]   INSERTION OF DIALYSIS CATHETER Right 07/25/2020   Procedure: INSERTION OF DIALYSIS CATHETER;  Surgeon: Virl Cagey, MD;  Location: [facility];  Service: General;  Laterality: Right;   INTRAOPERATIVE TRANSESOPHAGEAL ECHOCARDIOGRAM  12/27/2011   Global hypokinesis with EF of 40-45%, improved LAD distribution wall motion.   LEFT HEART CATHETERIZATION WITH CORONARY ANGIOGRAM N/A 12/13/2011   Procedure: LEFT HEART CATHETERIZATION WITH CORONARY ANGIOGRAM;  Surgeon: Leonie Man, MD;  Location: [facility] CATH LAB;  Service: Cardiovascular;  Laterality: N/A;   LUMBAR FUSION     MASS EXCISION  10/24/11   R arm   RIGHT HEART CATH N/A 07/12/2020   Procedure: RIGHT HEART CATH;  Surgeon: Belva Crome, MD;  Location: [facility] CV LAB;  Service: Cardiovascular;  Laterality: N/A;   TRANSESOPHAGEAL ECHOCARDIOGRAM  2013    Current Outpatient Medications  Medication Sig Dispense Refill   acetaminophen (TYLENOL) 500 MG tablet Take 1,000 mg by mouth every 6 (six) hours as needed for moderate pain or headache.      Cholecalciferol (VITAMIN D3) 25 MCG (1000 UT) CAPS Take 1 capsule (1,000 Units total) by mouth daily at 6 (six) AM. 60 capsule 6   Desvenlafaxine Succinate ER 25 MG TB24 TAKE ONE TABLET (25MG ) BY MOUTH DAILY 30 tablet 3   diltiazem (CARDIZEM CD) 120 MG 24 hr capsule Take 1 capsule (120 mg total) by mouth daily. 30 capsule 6   doxazosin (CARDURA) 8 MG tablet Take 8 mg by mouth daily.     Dulaglutide (TRULICITY) 1.93 XT/0.2IO SOPN Inject 0.75 mg into the skin once a week. On Thursdays. 2 mL 11   hydrALAZINE (APRESOLINE) 25 MG tablet Take 1 tablet (25 mg total) by mouth 3 (three)  times daily. 270 tablet 3   levothyroxine (SYNTHROID) 25 MCG tablet Take 1 tablet (25 mcg total) by mouth daily before breakfast. 30 tablet 6   multivitamin (RENA-VIT) TABS tablet Take 1 tablet by mouth daily.     pantoprazole (PROTONIX) 40 MG tablet TAKE ONE TABLET BY MOUTH TWICE A DAY 180 tablet 3   pravastatin (PRAVACHOL) 20 MG tablet Take 1 tablet (20 mg total) by mouth daily. 90 tablet 3   sacubitril-valsartan (ENTRESTO) 97-103 MG Take 1 tablet by mouth 2 (two) times daily. 60 tablet 6   torsemide (DEMADEX) 20 MG tablet Take 20 mg by mouth daily.     ALPRAZolam (XANAX) 0.5 MG tablet Take 1 tablet (0.5 mg total) by mouth at bedtime as needed for sleep. 30 tablet 0   aspirin EC 81 MG tablet Take 81 mg by mouth daily.     No current facility-administered medications for this visit.   Allergies:  No known allergies   Social History: The patient  reports that he quit smoking about 34 years ago. His smoking use included cigarettes. He has never used smokeless tobacco. He reports that he does not drink alcohol and does not use drugs.   Family History: The patient's family history includes Cancer in his father and mother.   ROS:  Please see the history of present illness. Otherwise, complete review of systems is positive for none.  All other systems are reviewed and negative.   Physical Exam: VS:  BP 138/74   Pulse 60    Ht 5\' 8"  (1.727 m)   Wt 145 lb (65.8 kg)   SpO2 97%   BMI 22.05 kg/m , BMI Body mass index is 22.05 kg/m.  Wt Readings from Last 3 Encounters:  05/04/21 145 lb (65.8 kg)  04/19/21 143 lb 9.6 oz (65.1 kg)  04/05/21 141 lb 9.6 oz (64.2 kg)    General: Patient appears comfortable at rest. Neck: Supple, no elevated JVP or carotid bruits, no thyromegaly. Lungs: Clear to auscultation, nonlabored breathing at rest. Cardiac: Regular rate and rhythm, no S3 or significant systolic murmur, no pericardial rub. Extremities: No pitting edema, distal pulses 2+. Skin: Warm and dry. Musculoskeletal: No kyphosis. Neuropsychiatric: Alert and oriented x3, affect grossly appropriate.  ECG:  .  EKG on 12/03/2020 sinus bradycardia rate of 52, consider LVH, inferior arm infarct, old, anterior Q waves possibly due to LVH  Recent Labwork: 07/08/2020: B Natriuretic Peptide >4,500.0 12/03/2020: ALT 22; AST 22 04/20/2021: BUN 32; Creatinine, Ser 3.18; Hemoglobin 12.1; Magnesium 2.2; Platelets 230; Potassium 4.2; Sodium 136; TSH 8.766     Component Value Date/Time   CHOL 104 07/13/2020 0118   TRIG 62 07/13/2020 0118   HDL 25 (L) 07/13/2020 0118   CHOLHDL 4.2 07/13/2020 0118   VLDL 12 07/13/2020 0118   LDLCALC 67 07/13/2020 0118   LDLCALC 73 05/01/2019 0945    Other Studies Reviewed Today:  Echocardiogram 11/17/2020 1. Left ventricular ejection fraction, by estimation, is 35 to 40%. The left ventricle has normal function. The left ventricle demonstrates global hypokinesis. Left ventricular diastolic parameters are consistent with Grade I diastolic dysfunction (impaired relaxation). Elevated left atrial pressure. The average left ventricular global longitudinal strain is -13.8 %. The global longitudinal strain is abnormal. 2. Right ventricular systolic function is mildly reduced. The right ventricular size is normal. 3. Left atrial size was moderately dilated. 4. Right atrial size was mildly dilated. 5.  The mitral valve is normal in structure. Mild mitral valve regurgitation. No evidence  of mitral stenosis. 6. The aortic valve is tricuspid. Aortic valve regurgitation is not visualized. No aortic stenosis is present. Comparison(s): Echocardiogram done 07/11/20 showed an EF of 25%.  Assessment and Plan:  1. Shortness of breath   2. Weakness   3. No energy   4. Decreased blood pressure, not hypotension   5. Medication management   6. PVC's (premature ventricular contractions)   7. Bilateral carotid artery stenosis   8. Vitamin D deficiency   9. Hypothyroidism, unspecified type   10. Ischemic cardiomyopathy     1. Shortness of breath Denies any significant shortness of breath at the moment.  Continue Entresto 97/103 p.o. twice daily.  Continue torsemide 20 mg daily.  Continue hydralazine 25 mg p.o. twice daily.  2. Weakness/hypothyroidism At last visit we ordered lab work due to his fatigue. His thyroid function tests were abnormal. TSH 8.766 , T3 69, T4 1.18.  Plans were to stop amiodarone and start Cardizem.  Plan was to start levothyroxine 25 mcg p.o. daily.  Please stop amiodarone.  Start Cardizem CD 120 mg daily.  Start levothyroxine 25 mcg p.o. daily.  Follow-up with thyroid panel including TSH, T3, T4 in 8 weeks after starting.  Patient states he will have lab work drawn at his PCP office.  I advised him to follow-up with PCP for management of hypothyroidism.  3. No energy /vitamin D deficiency. He had complained of no energy/fatigue over the prior couple of weeks at last visit.  Both his thyroid function and vitamin D levels were low.  We are starting levothyroxine 25 mcg p.o. daily.  We are starting cholecalciferol 25 mcg p.o. daily.  He is to follow with his primary care provider for management of hypothyroidism and vitamin D deficiency.  4. Decreased blood pressure, not hypotension At last visit he had stopped his evening dose of Entresto due to low blood pressures.  We  decreased his hydralazine down to 25 mg p.o. 3 times daily.  Blood pressure today is 138/74.  Continue hydralazine 25 mg p.o. 3 times daily.  5. Medication management At last visit we decreased his hydralazine dosage to 25 mg p.o. 3 times daily due to lower blood pressures.  Blood pressure today 138/74.  Continue hydralazine 25 mg p.o. twice daily.  6.  PVCs. We are discontinuing his amiodarone due to possible contribution to abnormal thyroid studies.  Please start Cardizem CD 120 mg p.o. daily to manage PVCs.  Currently denies any palpitations or arrhythmias.  7.  Carotid stenosis. He has a pending carotid study 05/11/2021.  Last study showed mild to moderate bilateral disease.  8.  ESRD He states his PD is going well.  Had a recent follow-up with Dr. Donnetta Hutching who was contemplating placing an AV fistula.  Patient and wife stated after vascular study Dr. Donnetta Hutching had decided anatomy was not amenable to placement of an AV fistula.  He will continue PD.  9.  Ischemic cardiomyopathy At prior visit his Bradley Black was increased to 97/103 p.o. twice daily.  Continue Entresto 97/103 mg p.o. twice daily. Continue torsemide 20 mg daily.  Continue hydralazine 25 mg p.o. 3 times daily.  Continue aspirin 81 mg daily.  Medication Adjustments/Labs and Tests Ordered: Current medicines are reviewed at length with the patient today.  Concerns regarding medicines are outlined above.   Disposition: Follow-up with Dr. Harl Bowie or APP 3 months  Signed, Levell July, NP 05/04/2021 2:56 PM    [facility]  [address], [place] [address] Phone: [phone]; Fax: [phone]

## 2021-05-04 ENCOUNTER — Telehealth: Payer: Self-pay | Admitting: *Deleted

## 2021-05-04 ENCOUNTER — Encounter: Payer: Self-pay | Admitting: Family Medicine

## 2021-05-04 ENCOUNTER — Ambulatory Visit (INDEPENDENT_AMBULATORY_CARE_PROVIDER_SITE_OTHER): Payer: Medicare Other | Admitting: Family Medicine

## 2021-05-04 VITALS — BP 138/74 | HR 60 | Ht 68.0 in | Wt 145.0 lb

## 2021-05-04 DIAGNOSIS — I493 Ventricular premature depolarization: Secondary | ICD-10-CM

## 2021-05-04 DIAGNOSIS — N186 End stage renal disease: Secondary | ICD-10-CM | POA: Diagnosis not present

## 2021-05-04 DIAGNOSIS — R031 Nonspecific low blood-pressure reading: Secondary | ICD-10-CM | POA: Diagnosis not present

## 2021-05-04 DIAGNOSIS — E559 Vitamin D deficiency, unspecified: Secondary | ICD-10-CM | POA: Diagnosis not present

## 2021-05-04 DIAGNOSIS — I6523 Occlusion and stenosis of bilateral carotid arteries: Secondary | ICD-10-CM

## 2021-05-04 DIAGNOSIS — R0602 Shortness of breath: Secondary | ICD-10-CM

## 2021-05-04 DIAGNOSIS — R531 Weakness: Secondary | ICD-10-CM | POA: Diagnosis not present

## 2021-05-04 DIAGNOSIS — I255 Ischemic cardiomyopathy: Secondary | ICD-10-CM

## 2021-05-04 DIAGNOSIS — Z79899 Other long term (current) drug therapy: Secondary | ICD-10-CM | POA: Diagnosis not present

## 2021-05-04 DIAGNOSIS — R5383 Other fatigue: Secondary | ICD-10-CM | POA: Diagnosis not present

## 2021-05-04 DIAGNOSIS — E039 Hypothyroidism, unspecified: Secondary | ICD-10-CM | POA: Diagnosis not present

## 2021-05-04 DIAGNOSIS — D509 Iron deficiency anemia, unspecified: Secondary | ICD-10-CM | POA: Diagnosis not present

## 2021-05-04 DIAGNOSIS — Z992 Dependence on renal dialysis: Secondary | ICD-10-CM | POA: Diagnosis not present

## 2021-05-04 MED ORDER — DILTIAZEM HCL ER COATED BEADS 120 MG PO CP24: 120.0000 mg | ORAL_CAPSULE | Freq: Every day | ORAL | 6 refills | Status: DC

## 2021-05-04 MED ORDER — VITAMIN D3 25 MCG (1000 UT) PO CAPS: 1.0000 | ORAL_CAPSULE | Freq: Every day | ORAL | 6 refills | Status: AC

## 2021-05-04 MED ORDER — LEVOTHYROXINE SODIUM 25 MCG PO TABS: 25.0000 ug | ORAL_TABLET | Freq: Every day | ORAL | 6 refills | Status: DC

## 2021-05-04 NOTE — Patient Instructions (Addendum)
Medication Instructions:  Your physician has recommended you make the following change in your medication:  Start vitamin d 25 mcg daily Start levothyroxine 25 mcg daily (30 minutes before food or other medications) Start cardizem cd 120 mg daily Continue other medications the same  Labwork: Thyroid panel in 8 weeks Vitamin D level in 8 weeks Non-fasting  Testing/Procedures: none  Follow-Up: Your physician recommends that you schedule a follow-up appointment in: 3 months  Any Other Special Instructions Will Be Listed Below (If Applicable).  If you need a refill on your cardiac medications before your next appointment, please call your pharmacy.

## 2021-05-04 NOTE — Chronic Care Management (AMB) (Signed)
  Care Management   Note  05/04/2021 Name: Bradley Black MRN: [id number] DOB: July 06, 1946  Bradley Black is a 74 y.o. year old male who is a primary care patient of Dennard Schaumann, [name], MD and is actively engaged with the care management team. I reached out to [place] by phone today to assist with re-scheduling a follow up visit with the RN Case Manager  Follow up plan: Unsuccessful telephone outreach attempt made. A HIPAA compliant phone message was left for the patient providing contact information and requesting a return call.  The care management team will reach out to the patient again over the next 7-14 days.  If patient returns call to provider office, please advise to call [place] at [phone].  [facility] Management  Direct Dial: [phone]

## 2021-05-05 DIAGNOSIS — N186 End stage renal disease: Secondary | ICD-10-CM | POA: Diagnosis not present

## 2021-05-05 DIAGNOSIS — Z992 Dependence on renal dialysis: Secondary | ICD-10-CM | POA: Diagnosis not present

## 2021-05-06 DIAGNOSIS — Z992 Dependence on renal dialysis: Secondary | ICD-10-CM | POA: Diagnosis not present

## 2021-05-06 DIAGNOSIS — N186 End stage renal disease: Secondary | ICD-10-CM | POA: Diagnosis not present

## 2021-05-07 DIAGNOSIS — N186 End stage renal disease: Secondary | ICD-10-CM | POA: Diagnosis not present

## 2021-05-07 DIAGNOSIS — Z992 Dependence on renal dialysis: Secondary | ICD-10-CM | POA: Diagnosis not present

## 2021-05-08 DIAGNOSIS — Z992 Dependence on renal dialysis: Secondary | ICD-10-CM | POA: Diagnosis not present

## 2021-05-08 DIAGNOSIS — N186 End stage renal disease: Secondary | ICD-10-CM | POA: Diagnosis not present

## 2021-05-09 DIAGNOSIS — Z992 Dependence on renal dialysis: Secondary | ICD-10-CM | POA: Diagnosis not present

## 2021-05-09 DIAGNOSIS — N186 End stage renal disease: Secondary | ICD-10-CM | POA: Diagnosis not present

## 2021-05-10 DIAGNOSIS — N186 End stage renal disease: Secondary | ICD-10-CM | POA: Diagnosis not present

## 2021-05-10 DIAGNOSIS — Z992 Dependence on renal dialysis: Secondary | ICD-10-CM | POA: Diagnosis not present

## 2021-05-11 ENCOUNTER — Ambulatory Visit (INDEPENDENT_AMBULATORY_CARE_PROVIDER_SITE_OTHER): Payer: Medicare Other

## 2021-05-11 DIAGNOSIS — N186 End stage renal disease: Secondary | ICD-10-CM | POA: Diagnosis not present

## 2021-05-11 DIAGNOSIS — R0989 Other specified symptoms and signs involving the circulatory and respiratory systems: Secondary | ICD-10-CM

## 2021-05-11 DIAGNOSIS — Z992 Dependence on renal dialysis: Secondary | ICD-10-CM | POA: Diagnosis not present

## 2021-05-11 DIAGNOSIS — I779 Disorder of arteries and arterioles, unspecified: Secondary | ICD-10-CM

## 2021-05-12 DIAGNOSIS — Z992 Dependence on renal dialysis: Secondary | ICD-10-CM | POA: Diagnosis not present

## 2021-05-12 DIAGNOSIS — N186 End stage renal disease: Secondary | ICD-10-CM | POA: Diagnosis not present

## 2021-05-13 DIAGNOSIS — Z992 Dependence on renal dialysis: Secondary | ICD-10-CM | POA: Diagnosis not present

## 2021-05-13 DIAGNOSIS — N186 End stage renal disease: Secondary | ICD-10-CM | POA: Diagnosis not present

## 2021-05-14 DIAGNOSIS — N186 End stage renal disease: Secondary | ICD-10-CM | POA: Diagnosis not present

## 2021-05-14 DIAGNOSIS — Z992 Dependence on renal dialysis: Secondary | ICD-10-CM | POA: Diagnosis not present

## 2021-05-15 DIAGNOSIS — N186 End stage renal disease: Secondary | ICD-10-CM | POA: Diagnosis not present

## 2021-05-15 DIAGNOSIS — Z992 Dependence on renal dialysis: Secondary | ICD-10-CM | POA: Diagnosis not present

## 2021-05-16 ENCOUNTER — Telehealth: Payer: Self-pay | Admitting: *Deleted

## 2021-05-16 DIAGNOSIS — N186 End stage renal disease: Secondary | ICD-10-CM | POA: Diagnosis not present

## 2021-05-16 DIAGNOSIS — Z992 Dependence on renal dialysis: Secondary | ICD-10-CM | POA: Diagnosis not present

## 2021-05-16 NOTE — Telephone Encounter (Signed)
-----   Message from Verta Ellen., NP sent at 05/14/2021  6:06 PM EDT ----- Please call the patient and let him know the carotid study showed he has some mild narrowing in the right carotid artery of 1-39%. The left carotid artery shows some medium narrowing. 40-59%. We will continue to monitor and get a yearly study to keep an eye on the narrowing, Thank [place], NP  05/14/2021 5:44 PM

## 2021-05-16 NOTE — Telephone Encounter (Signed)
Laurine Blazer, LPN  11/23/3610  2:44 PM EDT Back to Top    Notified, copy to pcp.

## 2021-05-17 DIAGNOSIS — Z992 Dependence on renal dialysis: Secondary | ICD-10-CM | POA: Diagnosis not present

## 2021-05-17 DIAGNOSIS — N186 End stage renal disease: Secondary | ICD-10-CM | POA: Diagnosis not present

## 2021-05-17 NOTE — Chronic Care Management (AMB) (Signed)
  Care Management   Note  05/17/2021 Name: Bradley Black MRN: [id number] DOB: 12-01-1945  [name] is a 75 y.o. year old male who is a primary care patient of Dennard Schaumann, [name], MD and is actively engaged with the care management team. I reached out to [place] by phone today to assist with re-scheduling a follow up visit with the RN Case Manager  Follow up plan: A second unsuccessful telephone outreach attempt made. A HIPAA compliant phone message was left for the patient providing contact information and requesting a return call. The care management team will reach out to the patient again over the next 7 days. If patient returns call to provider office, please advise to call [facility] at [phone].  [facility] Management  Direct Dial: [phone]

## 2021-05-18 DIAGNOSIS — Z992 Dependence on renal dialysis: Secondary | ICD-10-CM | POA: Diagnosis not present

## 2021-05-18 DIAGNOSIS — N186 End stage renal disease: Secondary | ICD-10-CM | POA: Diagnosis not present

## 2021-05-19 DIAGNOSIS — Z992 Dependence on renal dialysis: Secondary | ICD-10-CM | POA: Diagnosis not present

## 2021-05-19 DIAGNOSIS — N186 End stage renal disease: Secondary | ICD-10-CM | POA: Diagnosis not present

## 2021-05-19 NOTE — Chronic Care Management (AMB) (Signed)
  Care Management   Note  05/19/2021 Name: Bradley Black MRN: [id number] DOB: May 15, 1946  Bradley Black is a 75 y.o. year old male who is a primary care patient of Dennard Schaumann, [name], MD and is actively engaged with the care management team. I reached out to [place] by phone today to assist with re-scheduling a follow up visit with the RN Case Manager  Follow up plan: A third unsuccessful telephone outreach attempt made. A HIPAA compliant phone message was left for the patient providing contact information and requesting a return call. We have been unable to make contact with the patient for follow up. The care management team is available to follow up with the patient after provider conversation with the patient regarding recommendation for care management engagement and subsequent re-referral to the care management team.   [facility] Management  Direct Dial: [phone]

## 2021-05-20 DIAGNOSIS — Z992 Dependence on renal dialysis: Secondary | ICD-10-CM | POA: Diagnosis not present

## 2021-05-20 DIAGNOSIS — N186 End stage renal disease: Secondary | ICD-10-CM | POA: Diagnosis not present

## 2021-05-21 DIAGNOSIS — Z992 Dependence on renal dialysis: Secondary | ICD-10-CM | POA: Diagnosis not present

## 2021-05-21 DIAGNOSIS — N186 End stage renal disease: Secondary | ICD-10-CM | POA: Diagnosis not present

## 2021-05-22 DIAGNOSIS — Z992 Dependence on renal dialysis: Secondary | ICD-10-CM | POA: Diagnosis not present

## 2021-05-22 DIAGNOSIS — N186 End stage renal disease: Secondary | ICD-10-CM | POA: Diagnosis not present

## 2021-05-23 DIAGNOSIS — N186 End stage renal disease: Secondary | ICD-10-CM | POA: Diagnosis not present

## 2021-05-23 DIAGNOSIS — Z992 Dependence on renal dialysis: Secondary | ICD-10-CM | POA: Diagnosis not present

## 2021-05-24 ENCOUNTER — Ambulatory Visit: Payer: Self-pay | Admitting: *Deleted

## 2021-05-24 DIAGNOSIS — N186 End stage renal disease: Secondary | ICD-10-CM | POA: Diagnosis not present

## 2021-05-24 DIAGNOSIS — Z992 Dependence on renal dialysis: Secondary | ICD-10-CM | POA: Diagnosis not present

## 2021-05-24 NOTE — Chronic Care Management (AMB) (Signed)
   05/24/2021  Bradley Black 09/25/1946 [id number]   Case closed 05/19/2021 due to unable to reach.  RN care manager completed care plan.  Jacqlyn Larsen [facility], BSN RN Case Manager [facility] [phone]

## 2021-05-25 DIAGNOSIS — N186 End stage renal disease: Secondary | ICD-10-CM | POA: Diagnosis not present

## 2021-05-25 DIAGNOSIS — Z992 Dependence on renal dialysis: Secondary | ICD-10-CM | POA: Diagnosis not present

## 2021-05-26 DIAGNOSIS — N186 End stage renal disease: Secondary | ICD-10-CM | POA: Diagnosis not present

## 2021-05-26 DIAGNOSIS — Z992 Dependence on renal dialysis: Secondary | ICD-10-CM | POA: Diagnosis not present

## 2021-05-27 DIAGNOSIS — Z992 Dependence on renal dialysis: Secondary | ICD-10-CM | POA: Diagnosis not present

## 2021-05-27 DIAGNOSIS — N186 End stage renal disease: Secondary | ICD-10-CM | POA: Diagnosis not present

## 2021-05-28 DIAGNOSIS — N186 End stage renal disease: Secondary | ICD-10-CM | POA: Diagnosis not present

## 2021-05-28 DIAGNOSIS — Z992 Dependence on renal dialysis: Secondary | ICD-10-CM | POA: Diagnosis not present

## 2021-05-29 DIAGNOSIS — Z992 Dependence on renal dialysis: Secondary | ICD-10-CM | POA: Diagnosis not present

## 2021-05-29 DIAGNOSIS — N186 End stage renal disease: Secondary | ICD-10-CM | POA: Diagnosis not present

## 2021-05-30 DIAGNOSIS — Z992 Dependence on renal dialysis: Secondary | ICD-10-CM | POA: Diagnosis not present

## 2021-05-30 DIAGNOSIS — N186 End stage renal disease: Secondary | ICD-10-CM | POA: Diagnosis not present

## 2021-05-31 DIAGNOSIS — N186 End stage renal disease: Secondary | ICD-10-CM | POA: Diagnosis not present

## 2021-05-31 DIAGNOSIS — Z992 Dependence on renal dialysis: Secondary | ICD-10-CM | POA: Diagnosis not present

## 2021-06-01 DIAGNOSIS — N186 End stage renal disease: Secondary | ICD-10-CM | POA: Diagnosis not present

## 2021-06-01 DIAGNOSIS — Z992 Dependence on renal dialysis: Secondary | ICD-10-CM | POA: Diagnosis not present

## 2021-06-02 DIAGNOSIS — N186 End stage renal disease: Secondary | ICD-10-CM | POA: Diagnosis not present

## 2021-06-02 DIAGNOSIS — Z992 Dependence on renal dialysis: Secondary | ICD-10-CM | POA: Diagnosis not present

## 2021-06-03 DIAGNOSIS — Z992 Dependence on renal dialysis: Secondary | ICD-10-CM | POA: Diagnosis not present

## 2021-06-03 DIAGNOSIS — N186 End stage renal disease: Secondary | ICD-10-CM | POA: Diagnosis not present

## 2021-06-04 DIAGNOSIS — Z992 Dependence on renal dialysis: Secondary | ICD-10-CM | POA: Diagnosis not present

## 2021-06-04 DIAGNOSIS — N186 End stage renal disease: Secondary | ICD-10-CM | POA: Diagnosis not present

## 2021-06-05 DIAGNOSIS — Z992 Dependence on renal dialysis: Secondary | ICD-10-CM | POA: Diagnosis not present

## 2021-06-05 DIAGNOSIS — N186 End stage renal disease: Secondary | ICD-10-CM | POA: Diagnosis not present

## 2021-06-06 DIAGNOSIS — D509 Iron deficiency anemia, unspecified: Secondary | ICD-10-CM | POA: Diagnosis not present

## 2021-06-06 DIAGNOSIS — Z992 Dependence on renal dialysis: Secondary | ICD-10-CM | POA: Diagnosis not present

## 2021-06-06 DIAGNOSIS — N186 End stage renal disease: Secondary | ICD-10-CM | POA: Diagnosis not present

## 2021-06-07 DIAGNOSIS — N186 End stage renal disease: Secondary | ICD-10-CM | POA: Diagnosis not present

## 2021-06-07 DIAGNOSIS — Z992 Dependence on renal dialysis: Secondary | ICD-10-CM | POA: Diagnosis not present

## 2021-06-08 DIAGNOSIS — Z992 Dependence on renal dialysis: Secondary | ICD-10-CM | POA: Diagnosis not present

## 2021-06-08 DIAGNOSIS — N186 End stage renal disease: Secondary | ICD-10-CM | POA: Diagnosis not present

## 2021-06-09 DIAGNOSIS — N186 End stage renal disease: Secondary | ICD-10-CM | POA: Diagnosis not present

## 2021-06-09 DIAGNOSIS — Z992 Dependence on renal dialysis: Secondary | ICD-10-CM | POA: Diagnosis not present

## 2021-06-10 ENCOUNTER — Telehealth: Payer: Self-pay

## 2021-06-10 DIAGNOSIS — Z992 Dependence on renal dialysis: Secondary | ICD-10-CM | POA: Diagnosis not present

## 2021-06-10 DIAGNOSIS — N186 End stage renal disease: Secondary | ICD-10-CM | POA: Diagnosis not present

## 2021-06-10 NOTE — Telephone Encounter (Signed)
Attempted to reach patient for annual wellness visit. Left message to call office back to schedule. Kathrene Alu RN

## 2021-06-11 DIAGNOSIS — N186 End stage renal disease: Secondary | ICD-10-CM | POA: Diagnosis not present

## 2021-06-11 DIAGNOSIS — Z992 Dependence on renal dialysis: Secondary | ICD-10-CM | POA: Diagnosis not present

## 2021-06-12 DIAGNOSIS — Z992 Dependence on renal dialysis: Secondary | ICD-10-CM | POA: Diagnosis not present

## 2021-06-12 DIAGNOSIS — N186 End stage renal disease: Secondary | ICD-10-CM | POA: Diagnosis not present

## 2021-06-13 DIAGNOSIS — N186 End stage renal disease: Secondary | ICD-10-CM | POA: Diagnosis not present

## 2021-06-13 DIAGNOSIS — Z992 Dependence on renal dialysis: Secondary | ICD-10-CM | POA: Diagnosis not present

## 2021-06-14 DIAGNOSIS — Z992 Dependence on renal dialysis: Secondary | ICD-10-CM | POA: Diagnosis not present

## 2021-06-14 DIAGNOSIS — N186 End stage renal disease: Secondary | ICD-10-CM | POA: Diagnosis not present

## 2021-06-15 DIAGNOSIS — N186 End stage renal disease: Secondary | ICD-10-CM | POA: Diagnosis not present

## 2021-06-15 DIAGNOSIS — Z992 Dependence on renal dialysis: Secondary | ICD-10-CM | POA: Diagnosis not present

## 2021-06-16 DIAGNOSIS — Z992 Dependence on renal dialysis: Secondary | ICD-10-CM | POA: Diagnosis not present

## 2021-06-16 DIAGNOSIS — N186 End stage renal disease: Secondary | ICD-10-CM | POA: Diagnosis not present

## 2021-06-17 DIAGNOSIS — N186 End stage renal disease: Secondary | ICD-10-CM | POA: Diagnosis not present

## 2021-06-17 DIAGNOSIS — Z992 Dependence on renal dialysis: Secondary | ICD-10-CM | POA: Diagnosis not present

## 2021-06-18 DIAGNOSIS — Z992 Dependence on renal dialysis: Secondary | ICD-10-CM | POA: Diagnosis not present

## 2021-06-18 DIAGNOSIS — N186 End stage renal disease: Secondary | ICD-10-CM | POA: Diagnosis not present

## 2021-06-19 DIAGNOSIS — N186 End stage renal disease: Secondary | ICD-10-CM | POA: Diagnosis not present

## 2021-06-19 DIAGNOSIS — Z992 Dependence on renal dialysis: Secondary | ICD-10-CM | POA: Diagnosis not present

## 2021-06-20 DIAGNOSIS — Z992 Dependence on renal dialysis: Secondary | ICD-10-CM | POA: Diagnosis not present

## 2021-06-20 DIAGNOSIS — N186 End stage renal disease: Secondary | ICD-10-CM | POA: Diagnosis not present

## 2021-06-21 DIAGNOSIS — Z992 Dependence on renal dialysis: Secondary | ICD-10-CM | POA: Diagnosis not present

## 2021-06-21 DIAGNOSIS — N186 End stage renal disease: Secondary | ICD-10-CM | POA: Diagnosis not present

## 2021-06-22 DIAGNOSIS — Z992 Dependence on renal dialysis: Secondary | ICD-10-CM | POA: Diagnosis not present

## 2021-06-22 DIAGNOSIS — N186 End stage renal disease: Secondary | ICD-10-CM | POA: Diagnosis not present

## 2021-06-23 DIAGNOSIS — Z992 Dependence on renal dialysis: Secondary | ICD-10-CM | POA: Diagnosis not present

## 2021-06-23 DIAGNOSIS — N186 End stage renal disease: Secondary | ICD-10-CM | POA: Diagnosis not present

## 2021-06-24 DIAGNOSIS — Z992 Dependence on renal dialysis: Secondary | ICD-10-CM | POA: Diagnosis not present

## 2021-06-24 DIAGNOSIS — N186 End stage renal disease: Secondary | ICD-10-CM | POA: Diagnosis not present

## 2021-06-25 DIAGNOSIS — N186 End stage renal disease: Secondary | ICD-10-CM | POA: Diagnosis not present

## 2021-06-25 DIAGNOSIS — Z992 Dependence on renal dialysis: Secondary | ICD-10-CM | POA: Diagnosis not present

## 2021-06-26 DIAGNOSIS — Z992 Dependence on renal dialysis: Secondary | ICD-10-CM | POA: Diagnosis not present

## 2021-06-26 DIAGNOSIS — N186 End stage renal disease: Secondary | ICD-10-CM | POA: Diagnosis not present

## 2021-06-27 DIAGNOSIS — Z992 Dependence on renal dialysis: Secondary | ICD-10-CM | POA: Diagnosis not present

## 2021-06-27 DIAGNOSIS — N186 End stage renal disease: Secondary | ICD-10-CM | POA: Diagnosis not present

## 2021-06-28 DIAGNOSIS — N186 End stage renal disease: Secondary | ICD-10-CM | POA: Diagnosis not present

## 2021-06-28 DIAGNOSIS — Z992 Dependence on renal dialysis: Secondary | ICD-10-CM | POA: Diagnosis not present

## 2021-06-29 DIAGNOSIS — N186 End stage renal disease: Secondary | ICD-10-CM | POA: Diagnosis not present

## 2021-06-29 DIAGNOSIS — Z992 Dependence on renal dialysis: Secondary | ICD-10-CM | POA: Diagnosis not present

## 2021-06-30 DIAGNOSIS — N186 End stage renal disease: Secondary | ICD-10-CM | POA: Diagnosis not present

## 2021-06-30 DIAGNOSIS — Z992 Dependence on renal dialysis: Secondary | ICD-10-CM | POA: Diagnosis not present

## 2021-07-01 DIAGNOSIS — Z23 Encounter for immunization: Secondary | ICD-10-CM | POA: Diagnosis not present

## 2021-07-01 DIAGNOSIS — N186 End stage renal disease: Secondary | ICD-10-CM | POA: Diagnosis not present

## 2021-07-01 DIAGNOSIS — Z992 Dependence on renal dialysis: Secondary | ICD-10-CM | POA: Diagnosis not present

## 2021-07-02 DIAGNOSIS — Z23 Encounter for immunization: Secondary | ICD-10-CM | POA: Diagnosis not present

## 2021-07-02 DIAGNOSIS — N186 End stage renal disease: Secondary | ICD-10-CM | POA: Diagnosis not present

## 2021-07-02 DIAGNOSIS — Z992 Dependence on renal dialysis: Secondary | ICD-10-CM | POA: Diagnosis not present

## 2021-07-03 DIAGNOSIS — Z23 Encounter for immunization: Secondary | ICD-10-CM | POA: Diagnosis not present

## 2021-07-03 DIAGNOSIS — Z992 Dependence on renal dialysis: Secondary | ICD-10-CM | POA: Diagnosis not present

## 2021-07-03 DIAGNOSIS — N186 End stage renal disease: Secondary | ICD-10-CM | POA: Diagnosis not present

## 2021-07-04 DIAGNOSIS — Z992 Dependence on renal dialysis: Secondary | ICD-10-CM | POA: Diagnosis not present

## 2021-07-04 DIAGNOSIS — Z23 Encounter for immunization: Secondary | ICD-10-CM | POA: Diagnosis not present

## 2021-07-04 DIAGNOSIS — N186 End stage renal disease: Secondary | ICD-10-CM | POA: Diagnosis not present

## 2021-07-05 DIAGNOSIS — N186 End stage renal disease: Secondary | ICD-10-CM | POA: Diagnosis not present

## 2021-07-05 DIAGNOSIS — Z992 Dependence on renal dialysis: Secondary | ICD-10-CM | POA: Diagnosis not present

## 2021-07-05 DIAGNOSIS — Z23 Encounter for immunization: Secondary | ICD-10-CM | POA: Diagnosis not present

## 2021-07-06 DIAGNOSIS — E119 Type 2 diabetes mellitus without complications: Secondary | ICD-10-CM | POA: Diagnosis not present

## 2021-07-06 DIAGNOSIS — D509 Iron deficiency anemia, unspecified: Secondary | ICD-10-CM | POA: Diagnosis not present

## 2021-07-06 DIAGNOSIS — Z23 Encounter for immunization: Secondary | ICD-10-CM | POA: Diagnosis not present

## 2021-07-06 DIAGNOSIS — N186 End stage renal disease: Secondary | ICD-10-CM | POA: Diagnosis not present

## 2021-07-06 DIAGNOSIS — Z992 Dependence on renal dialysis: Secondary | ICD-10-CM | POA: Diagnosis not present

## 2021-07-07 DIAGNOSIS — Z992 Dependence on renal dialysis: Secondary | ICD-10-CM | POA: Diagnosis not present

## 2021-07-07 DIAGNOSIS — N186 End stage renal disease: Secondary | ICD-10-CM | POA: Diagnosis not present

## 2021-07-07 DIAGNOSIS — Z23 Encounter for immunization: Secondary | ICD-10-CM | POA: Diagnosis not present

## 2021-07-08 DIAGNOSIS — Z23 Encounter for immunization: Secondary | ICD-10-CM | POA: Diagnosis not present

## 2021-07-08 DIAGNOSIS — Z992 Dependence on renal dialysis: Secondary | ICD-10-CM | POA: Diagnosis not present

## 2021-07-08 DIAGNOSIS — N186 End stage renal disease: Secondary | ICD-10-CM | POA: Diagnosis not present

## 2021-07-09 DIAGNOSIS — Z992 Dependence on renal dialysis: Secondary | ICD-10-CM | POA: Diagnosis not present

## 2021-07-09 DIAGNOSIS — Z23 Encounter for immunization: Secondary | ICD-10-CM | POA: Diagnosis not present

## 2021-07-09 DIAGNOSIS — N186 End stage renal disease: Secondary | ICD-10-CM | POA: Diagnosis not present

## 2021-07-10 DIAGNOSIS — Z992 Dependence on renal dialysis: Secondary | ICD-10-CM | POA: Diagnosis not present

## 2021-07-10 DIAGNOSIS — Z23 Encounter for immunization: Secondary | ICD-10-CM | POA: Diagnosis not present

## 2021-07-10 DIAGNOSIS — N186 End stage renal disease: Secondary | ICD-10-CM | POA: Diagnosis not present

## 2021-07-11 ENCOUNTER — Other Ambulatory Visit (HOSPITAL_COMMUNITY): Payer: Self-pay | Admitting: Family Medicine

## 2021-07-11 DIAGNOSIS — N186 End stage renal disease: Secondary | ICD-10-CM | POA: Diagnosis not present

## 2021-07-11 DIAGNOSIS — I6523 Occlusion and stenosis of bilateral carotid arteries: Secondary | ICD-10-CM

## 2021-07-11 DIAGNOSIS — Z23 Encounter for immunization: Secondary | ICD-10-CM | POA: Diagnosis not present

## 2021-07-11 DIAGNOSIS — Z992 Dependence on renal dialysis: Secondary | ICD-10-CM | POA: Diagnosis not present

## 2021-07-12 DIAGNOSIS — Z992 Dependence on renal dialysis: Secondary | ICD-10-CM | POA: Diagnosis not present

## 2021-07-12 DIAGNOSIS — N186 End stage renal disease: Secondary | ICD-10-CM | POA: Diagnosis not present

## 2021-07-12 DIAGNOSIS — Z23 Encounter for immunization: Secondary | ICD-10-CM | POA: Diagnosis not present

## 2021-07-13 DIAGNOSIS — Z992 Dependence on renal dialysis: Secondary | ICD-10-CM | POA: Diagnosis not present

## 2021-07-13 DIAGNOSIS — Z23 Encounter for immunization: Secondary | ICD-10-CM | POA: Diagnosis not present

## 2021-07-13 DIAGNOSIS — N186 End stage renal disease: Secondary | ICD-10-CM | POA: Diagnosis not present

## 2021-07-14 DIAGNOSIS — N186 End stage renal disease: Secondary | ICD-10-CM | POA: Diagnosis not present

## 2021-07-14 DIAGNOSIS — Z23 Encounter for immunization: Secondary | ICD-10-CM | POA: Diagnosis not present

## 2021-07-14 DIAGNOSIS — Z992 Dependence on renal dialysis: Secondary | ICD-10-CM | POA: Diagnosis not present

## 2021-07-15 DIAGNOSIS — Z23 Encounter for immunization: Secondary | ICD-10-CM | POA: Diagnosis not present

## 2021-07-15 DIAGNOSIS — Z992 Dependence on renal dialysis: Secondary | ICD-10-CM | POA: Diagnosis not present

## 2021-07-15 DIAGNOSIS — N186 End stage renal disease: Secondary | ICD-10-CM | POA: Diagnosis not present

## 2021-07-16 DIAGNOSIS — Z23 Encounter for immunization: Secondary | ICD-10-CM | POA: Diagnosis not present

## 2021-07-16 DIAGNOSIS — Z992 Dependence on renal dialysis: Secondary | ICD-10-CM | POA: Diagnosis not present

## 2021-07-16 DIAGNOSIS — N186 End stage renal disease: Secondary | ICD-10-CM | POA: Diagnosis not present

## 2021-07-17 ENCOUNTER — Other Ambulatory Visit: Payer: Self-pay

## 2021-07-17 ENCOUNTER — Ambulatory Visit (INDEPENDENT_AMBULATORY_CARE_PROVIDER_SITE_OTHER): Payer: Medicare Other | Admitting: Family Medicine

## 2021-07-17 VITALS — BP 124/78 | HR 60 | Temp 98.8°F | Resp 14 | Ht 68.0 in | Wt 139.0 lb

## 2021-07-17 DIAGNOSIS — R079 Chest pain, unspecified: Secondary | ICD-10-CM

## 2021-07-17 DIAGNOSIS — K219 Gastro-esophageal reflux disease without esophagitis: Secondary | ICD-10-CM | POA: Diagnosis not present

## 2021-07-17 DIAGNOSIS — Z992 Dependence on renal dialysis: Secondary | ICD-10-CM | POA: Diagnosis not present

## 2021-07-17 DIAGNOSIS — N186 End stage renal disease: Secondary | ICD-10-CM | POA: Diagnosis not present

## 2021-07-17 DIAGNOSIS — M5134 Other intervertebral disc degeneration, thoracic region: Secondary | ICD-10-CM | POA: Diagnosis not present

## 2021-07-17 DIAGNOSIS — Z23 Encounter for immunization: Secondary | ICD-10-CM | POA: Diagnosis not present

## 2021-07-17 NOTE — Progress Notes (Signed)
Subjective:    Patient ID: Bradley Black, male    DOB: [birth year], 74 y.o.   MRN: [id number]  Patient states that he is having severe heartburn.  This is been going on for several weeks.  He reports feeling a burning sensation in the center of his chest whenever he lies down at night.  He has tried elevating the head of his bed in the past but has seen very little benefit from this.  He denies any melena or hematochezia.  He denies any sudden weight loss.  He denies any fevers or chills.  He is already on Protonix 40 mg twice daily.  He is not taking any NSAIDs.  However, of note, a few months ago we started him on Trulicity which delays gastric emptying and could be exacerbating the situation.  He also reports pain in the center of his back starting roughly around the level of T7 and radiating up to C7.  He describes it as spasm-like pain that only gets better if he lays down.  It is constant throughout the day.  He is unable to tolerate NSAIDs due to the GERD.  He is on peritoneal dialysis and muscle relaxers are risky due to potential sedation and falls.  Tylenol is not helping the pain.  He had an MRI of his back in 2015 which showed a bulging disc between T8 and T9 abutting the thecal sac.  Otherwise the MRI of the thoracic spine showed no significant disease.  This leads me to believe that this is most likely muscle spasms. Past Medical History:  Diagnosis Date   BPH (benign prostatic hyperplasia)    CAD S/P percutaneous coronary angioplasty    a. PTCA of [place] b. PCI with BMS to LAD in 1997 c. RCA PCI [place] d. s/p CABG in 11/2011 with LIMA-LAD, SVG-PDA, SVG-OM2, and SVG-D1   Chronic back pain    CKD (chronic kidney disease), stage III (HCC)    Cyst of bursa    R shoulder   Diabetic retinopathy ([place])    DM (diabetes mellitus), type 2 with renal complications (HCC)    Essential hypertension    Frequent PVCs    GERD (gastroesophageal reflux disease)    Ischemic  cardiomyopathy 11/2011   Intra-OP TEE: EF 40-45%, no regional WMA; improved Anterior WM post CABG.   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   LIMA to LAD, SVG to D1, SVG to OM2, SVG to PDA, EVH via right thigh and leg   Past Surgical History:  Procedure Laterality Date   AV FISTULA PLACEMENT Left 07/28/2020   Procedure: LEFT ARM ARTERIOVENOUS (AV) FISTULA  CREATION;  Surgeon: Rosetta Posner, MD;  Location: [facility];  Service: Vascular;  Laterality: Left;   [place]  2013   CORONARY ANGIOPLASTY  1994   OM   CORONARY ANGIOPLASTY  1997   LAD   CORONARY ANGIOPLASTY WITH STENT PLACEMENT  1999   RCA   CORONARY ANGIOPLASTY WITH STENT PLACEMENT  2001   RCA   CORONARY ARTERY BYPASS GRAFT  12/27/2011   Procedure: CORONARY ARTERY BYPASS GRAFTING (CABG);  Surgeon: Rexene Alberts, MD;  Location: [place];  Service: Open Heart Surgery;  Laterality: N/A;  Times four. On pump. Using endoscopically harvested right greater saphenous vein and left internal mammary artery.    HERNIA REPAIR  INCISION AND DRAINAGE OF WOUND  2006   [place]   INSERTION OF DIALYSIS CATHETER Right 07/25/2020   Procedure: INSERTION OF DIALYSIS CATHETER;  Surgeon: Virl Cagey, MD;  Location: [facility];  Service: General;  Laterality: Right;   INTRAOPERATIVE TRANSESOPHAGEAL ECHOCARDIOGRAM  12/27/2011   Global hypokinesis with EF of 40-45%, improved LAD distribution wall motion.   LEFT HEART CATHETERIZATION WITH CORONARY ANGIOGRAM N/A 12/13/2011   Procedure: LEFT HEART CATHETERIZATION WITH CORONARY ANGIOGRAM;  Surgeon: Leonie Man, MD;  Location: [facility] CATH LAB;  Service: Cardiovascular;  Laterality: N/A;   LUMBAR FUSION     MASS EXCISION  10/24/11   R arm   RIGHT HEART CATH N/A 07/12/2020   Procedure: RIGHT HEART CATH;  Surgeon: Belva Crome, MD;  Location: [facility] CV LAB;  Service: Cardiovascular;  Laterality: N/A;   TRANSESOPHAGEAL  ECHOCARDIOGRAM  2013   Current Outpatient Medications on File Prior to Visit  Medication Sig Dispense Refill   acetaminophen (TYLENOL) 500 MG tablet Take 1,000 mg by mouth every 6 (six) hours as needed for moderate pain or headache.     ALPRAZolam (XANAX) 0.5 MG tablet Take 1 tablet (0.5 mg total) by mouth at bedtime as needed for sleep. 30 tablet 0   aspirin EC 81 MG tablet Take 81 mg by mouth daily.     Cholecalciferol (VITAMIN D3) 25 MCG (1000 UT) CAPS Take 1 capsule (1,000 Units total) by mouth daily at 6 (six) AM. 60 capsule 6   Desvenlafaxine Succinate ER 25 MG TB24 TAKE ONE TABLET (25MG ) BY MOUTH DAILY 30 tablet 3   doxazosin (CARDURA) 8 MG tablet Take 8 mg by mouth daily.     Dulaglutide (TRULICITY) 3.42 AJ/6.8TL SOPN Inject 0.75 mg into the skin once a week. On Thursdays. 2 mL 11   hydrALAZINE (APRESOLINE) 25 MG tablet Take 1 tablet (25 mg total) by mouth 3 (three) times daily. 270 tablet 3   levothyroxine (SYNTHROID) 25 MCG tablet Take 1 tablet (25 mcg total) by mouth daily before breakfast. 30 tablet 6   multivitamin (RENA-VIT) TABS tablet Take 1 tablet by mouth daily.     pantoprazole (PROTONIX) 40 MG tablet TAKE ONE TABLET BY MOUTH TWICE A DAY 180 tablet 3   pravastatin (PRAVACHOL) 20 MG tablet Take 1 tablet (20 mg total) by mouth daily. 90 tablet 3   sacubitril-valsartan (ENTRESTO) 97-103 MG Take 1 tablet by mouth 2 (two) times daily. 60 tablet 6   torsemide (DEMADEX) 20 MG tablet Take 20 mg by mouth daily.     No current facility-administered medications on file prior to visit.   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  Tobacco Use   Smoking status: Former    Types:  Cigarettes    Quit date: 10/01/1986    Years since quitting: 34.8   Smokeless tobacco: Never   Tobacco comments:    quit about 30 yrs ago  Vaping Use   Vaping Use: Never used  Substance and Sexual Activity   Alcohol use: No   Drug use: No   Sexual activity: Not on file  Other Topics Concern   Not on file  Social History Narrative   Married,  father of one,  grandfather 41. Works out at [employer] at [employer] roughly 2-3 days a week. He works on a treadmill. He does note having a hard time getting his heart rate up.   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   He quit smoking in 1988, and does not drink alcohol.   Social Determinants of Health   Financial Resource Strain: Low Risk    Difficulty of Paying Living Expenses: Not hard at all  Food Insecurity: No Food Insecurity   Worried About Charity fundraiser in the Last Year: Never true   [place] in the Last Year: Never true  Transportation Needs: No Transportation Needs   Lack of Transportation (Medical): No   Lack of Transportation (Non-Medical): No  Physical Activity: Inactive   Days of Exercise per Week: 0 days   Minutes of Exercise per Session: 0 min  Stress: No Stress Concern Present   Feeling of Stress : Only a little  Social Connections: Unknown   Frequency of Communication with Friends and Family: More than three times a week   Frequency of Social Gatherings with Friends and Family: Three times a week   Attends Religious Services: Not on file   Active Member of Clubs or Organizations: Yes   Attends Archivist Meetings: Not on file   Marital Status: Married  Human resources officer Violence: Not At Risk   Fear of Current or Ex-Partner: No   Emotionally Abused: No   Physically Abused: No   Sexually Abused: No      Review of Systems  All other systems reviewed and are negative.     Objective:   Physical Exam Vitals reviewed.  Constitutional:      Appearance: Normal appearance.  Cardiovascular:      Rate and Rhythm: Normal rate and regular rhythm.     Heart sounds: Normal heart sounds.  Pulmonary:     Effort: Pulmonary effort is normal.     Breath sounds: Normal breath sounds.  Musculoskeletal:     Thoracic back: Spasms and tenderness present. No deformity or bony tenderness. Normal range of motion.       Back:  Skin:    Findings: No erythema or rash.  Neurological:     Mental Status: He is alert.          Assessment & Plan:  Chest pain due to GERD  DDD (degenerative disc disease), thoracic Regarding his GERD, I gave the patient 3 options.  We could add sucralfate, he could hold Trulicity and elevate the head of his bed, or we could consult GI for an EGD.  Patient would like to see GI because he is concerned about the severity of the symptoms.  Meanwhile, I have asked him to hold the Trulicity as I feel that this is likely exacerbating the GERD and I recommended that he increase the head of his bed 2 or 3 inches to see if this will help prior to seeing GI.  Regarding the degenerative disc disease in his back and the compensatory muscle spasms, I have recommended a TLSO brace to try to avoid muscle relaxers in this frail patient.  The neck step would be physical therapy if this does not help

## 2021-07-18 DIAGNOSIS — N186 End stage renal disease: Secondary | ICD-10-CM | POA: Diagnosis not present

## 2021-07-18 DIAGNOSIS — Z992 Dependence on renal dialysis: Secondary | ICD-10-CM | POA: Diagnosis not present

## 2021-07-18 DIAGNOSIS — Z23 Encounter for immunization: Secondary | ICD-10-CM | POA: Diagnosis not present

## 2021-07-19 ENCOUNTER — Encounter: Payer: Self-pay | Admitting: Internal Medicine

## 2021-07-19 DIAGNOSIS — Z992 Dependence on renal dialysis: Secondary | ICD-10-CM | POA: Diagnosis not present

## 2021-07-19 DIAGNOSIS — N186 End stage renal disease: Secondary | ICD-10-CM | POA: Diagnosis not present

## 2021-07-19 DIAGNOSIS — Z23 Encounter for immunization: Secondary | ICD-10-CM | POA: Diagnosis not present

## 2021-07-20 DIAGNOSIS — N186 End stage renal disease: Secondary | ICD-10-CM | POA: Diagnosis not present

## 2021-07-20 DIAGNOSIS — Z992 Dependence on renal dialysis: Secondary | ICD-10-CM | POA: Diagnosis not present

## 2021-07-20 DIAGNOSIS — Z23 Encounter for immunization: Secondary | ICD-10-CM | POA: Diagnosis not present

## 2021-07-21 DIAGNOSIS — Z23 Encounter for immunization: Secondary | ICD-10-CM | POA: Diagnosis not present

## 2021-07-21 DIAGNOSIS — N186 End stage renal disease: Secondary | ICD-10-CM | POA: Diagnosis not present

## 2021-07-21 DIAGNOSIS — Z992 Dependence on renal dialysis: Secondary | ICD-10-CM | POA: Diagnosis not present

## 2021-07-22 DIAGNOSIS — Z23 Encounter for immunization: Secondary | ICD-10-CM | POA: Diagnosis not present

## 2021-07-22 DIAGNOSIS — N186 End stage renal disease: Secondary | ICD-10-CM | POA: Diagnosis not present

## 2021-07-22 DIAGNOSIS — Z992 Dependence on renal dialysis: Secondary | ICD-10-CM | POA: Diagnosis not present

## 2021-07-23 DIAGNOSIS — Z992 Dependence on renal dialysis: Secondary | ICD-10-CM | POA: Diagnosis not present

## 2021-07-23 DIAGNOSIS — Z23 Encounter for immunization: Secondary | ICD-10-CM | POA: Diagnosis not present

## 2021-07-23 DIAGNOSIS — N186 End stage renal disease: Secondary | ICD-10-CM | POA: Diagnosis not present

## 2021-07-24 DIAGNOSIS — N186 End stage renal disease: Secondary | ICD-10-CM | POA: Diagnosis not present

## 2021-07-24 DIAGNOSIS — Z23 Encounter for immunization: Secondary | ICD-10-CM | POA: Diagnosis not present

## 2021-07-24 DIAGNOSIS — Z992 Dependence on renal dialysis: Secondary | ICD-10-CM | POA: Diagnosis not present

## 2021-07-25 DIAGNOSIS — N186 End stage renal disease: Secondary | ICD-10-CM | POA: Diagnosis not present

## 2021-07-25 DIAGNOSIS — Z992 Dependence on renal dialysis: Secondary | ICD-10-CM | POA: Diagnosis not present

## 2021-07-25 DIAGNOSIS — Z23 Encounter for immunization: Secondary | ICD-10-CM | POA: Diagnosis not present

## 2021-07-26 DIAGNOSIS — Z23 Encounter for immunization: Secondary | ICD-10-CM | POA: Diagnosis not present

## 2021-07-26 DIAGNOSIS — Z992 Dependence on renal dialysis: Secondary | ICD-10-CM | POA: Diagnosis not present

## 2021-07-26 DIAGNOSIS — N186 End stage renal disease: Secondary | ICD-10-CM | POA: Diagnosis not present

## 2021-07-27 DIAGNOSIS — N186 End stage renal disease: Secondary | ICD-10-CM | POA: Diagnosis not present

## 2021-07-27 DIAGNOSIS — Z23 Encounter for immunization: Secondary | ICD-10-CM | POA: Diagnosis not present

## 2021-07-27 DIAGNOSIS — Z992 Dependence on renal dialysis: Secondary | ICD-10-CM | POA: Diagnosis not present

## 2021-07-28 DIAGNOSIS — N186 End stage renal disease: Secondary | ICD-10-CM | POA: Diagnosis not present

## 2021-07-28 DIAGNOSIS — Z23 Encounter for immunization: Secondary | ICD-10-CM | POA: Diagnosis not present

## 2021-07-28 DIAGNOSIS — Z992 Dependence on renal dialysis: Secondary | ICD-10-CM | POA: Diagnosis not present

## 2021-07-29 DIAGNOSIS — Z23 Encounter for immunization: Secondary | ICD-10-CM | POA: Diagnosis not present

## 2021-07-29 DIAGNOSIS — Z992 Dependence on renal dialysis: Secondary | ICD-10-CM | POA: Diagnosis not present

## 2021-07-29 DIAGNOSIS — N186 End stage renal disease: Secondary | ICD-10-CM | POA: Diagnosis not present

## 2021-07-30 DIAGNOSIS — Z992 Dependence on renal dialysis: Secondary | ICD-10-CM | POA: Diagnosis not present

## 2021-07-30 DIAGNOSIS — Z23 Encounter for immunization: Secondary | ICD-10-CM | POA: Diagnosis not present

## 2021-07-30 DIAGNOSIS — N186 End stage renal disease: Secondary | ICD-10-CM | POA: Diagnosis not present

## 2021-07-31 ENCOUNTER — Telehealth: Payer: Self-pay

## 2021-07-31 ENCOUNTER — Other Ambulatory Visit: Payer: Self-pay | Admitting: Family Medicine

## 2021-07-31 DIAGNOSIS — Z23 Encounter for immunization: Secondary | ICD-10-CM | POA: Diagnosis not present

## 2021-07-31 DIAGNOSIS — Z992 Dependence on renal dialysis: Secondary | ICD-10-CM | POA: Diagnosis not present

## 2021-07-31 DIAGNOSIS — N186 End stage renal disease: Secondary | ICD-10-CM | POA: Diagnosis not present

## 2021-07-31 DIAGNOSIS — M5134 Other intervertebral disc degeneration, thoracic region: Secondary | ICD-10-CM

## 2021-07-31 NOTE — Telephone Encounter (Signed)
Pt called in requesting to get a referral for Physical Therapy. Pt stated that he was seen by in office by pcp on 07/17/21, but wanted to know if getting a referral was something he could still do. Please advise.   Cb#: [phone]

## 2021-07-31 NOTE — Telephone Encounter (Signed)
Ok to place orders

## 2021-08-01 DIAGNOSIS — N186 End stage renal disease: Secondary | ICD-10-CM | POA: Diagnosis not present

## 2021-08-01 DIAGNOSIS — Z992 Dependence on renal dialysis: Secondary | ICD-10-CM | POA: Diagnosis not present

## 2021-08-01 NOTE — Telephone Encounter (Signed)
Referral orders placed

## 2021-08-01 NOTE — Progress Notes (Signed)
Cardiology Office Note  Date: 08/03/2021   ID: Bradley Black, DOB [birth year], MRN [id number]  PCP:  Susy Frizzle, MD  Cardiologist:  Carlyle Dolly, MD Electrophysiologist:  Thompson Grayer, MD   Chief Complaint: 29-month follow-up  History of Present Illness: Bradley Black is a 75 y.o. male with a history of CAD, DDD, DM2, frequent PVCs, ischemic cardiomyopathy, carotid artery stenosis, hyperlipidemia, status post CABG x4, BPH, chronic back pain, ESRD, on PD hypothyroidism.    He last saw Dr. Harl Bowie on 03/16/2021 he was having no recent symptoms related to his CAD.  He had been started on Entresto at a prior visit at 24/26 mg p.o. twice daily and later increased it to 49/51 mg p.o. twice daily.  At that time he had no shortness of breath or DOE or recent edema.  Baseline weight was around 140 pounds.  His most recent echocardiogram in February 2022 demonstrated EF of 35 to 40% grade 1 DD, mild RV dysfunction.  He was not on beta-blocker due to bradycardia.  His recent monitor the prior year showed 18% PVC burden and ultimately started on amiodarone 200 mg daily..  Previous carotid artery duplex on August 2021 showed mild R ICA disease and LICA with moderate disease.  He was currently on peritoneal dialysis for ESRD.  During that visit his Delene Loll was increased to 97/103 mg p.o. twice daily.  Dr. Harl Bowie stated pending labs will consider adding Aldactone.  Regarding carotid stenosis he mention repeating ultrasound at next follow-up.  Blood pressure has been elevated by home numbers.  Plans to follow with higher Entresto dosages.  He was last here for follow-up.  At a prior visit we ordered lab work due to his fatigue. His thyroid function tests were abnormal. TSH 8.766 , T3 69, T4 1.18.  Plans were to stop amiodarone and start Cardizem.  Plan was to start levothyroxine 25 mcg p.o. daily.  Vitamin D was low.  Plan was to start cholecalciferol 25 mcg daily.  Hgb / Hct 12.1 / 37.0. Glucose  was 136.  He was notified by nursing staff on 04/28/2021.  Patient and wife wanted to wait until follow-up visit before starting the medication to discuss.  He had recently seen Dr. Donnetta Hutching who wanted to place AV fistula but stated blood vessels were not amenable to placing.  States he still feels tired.     He is here for 32-month follow-up.  He states since last visit when amiodarone was stopped, thyroid medication was started he is felt much better.  He has had no follow-up thyroid lab work since we stopped the amiodarone.  He states he feels much better.  Blood pressure today is 136/64, heart rate is 54.  He denies any anginal or exertional symptoms, orthostatic symptoms, CVA or TIA-like symptoms, palpitations or arrhythmias.  He continues PD without any complications.  He was having some issues with chest burning at PCP office.  He states once his Trulicity was stopped the chest pain stopped.  He denies any orthostatic symptoms, CVA or TIA-like symptoms, bleeding issues.  No PND, orthopnea, DOE, SOB.  He is maintaining his weight at around 139.  No lower extremity edema.  EKG today shows sinus bradycardia with a rate of 51, inferior infarct age undetermined, cannot rule out anterior infarct.   Past Medical History:  Diagnosis Date   BPH (benign prostatic hyperplasia)    CAD S/P percutaneous coronary angioplasty    a. PTCA of [place]  b. PCI with BMS to LAD in 1997 c. RCA PCI [place] d. s/p CABG in 11/2011 with LIMA-LAD, SVG-PDA, SVG-OM2, and SVG-D1   Chronic back pain    CKD (chronic kidney disease), stage III (HCC)    Cyst of bursa    R shoulder   Diabetic retinopathy ([place])    DM (diabetes mellitus), type 2 with renal complications (HCC)    Essential hypertension    Frequent PVCs    GERD (gastroesophageal reflux disease)    Ischemic cardiomyopathy 11/2011   Intra-OP TEE: EF 40-45%, no regional WMA; improved Anterior WM post CABG.   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   LIMA to LAD, SVG to D1, SVG to OM2, SVG to PDA, EVH via right thigh and leg    Past Surgical History:  Procedure Laterality Date   AV FISTULA PLACEMENT Left 07/28/2020   Procedure: LEFT ARM ARTERIOVENOUS (AV) FISTULA  CREATION;  Surgeon: Rosetta Posner, MD;  Location: [facility];  Service: Vascular;  Laterality: Left;   [place]  2013   CORONARY ANGIOPLASTY  1994   OM   CORONARY ANGIOPLASTY  1997   LAD   CORONARY ANGIOPLASTY WITH STENT PLACEMENT  1999   RCA   CORONARY ANGIOPLASTY WITH STENT PLACEMENT  2001   RCA   CORONARY ARTERY BYPASS GRAFT  12/27/2011   Procedure: CORONARY ARTERY BYPASS GRAFTING (CABG);  Surgeon: Rexene Alberts, MD;  Location: [facility];  Service: Open Heart Surgery;  Laterality: N/A;  Times four. On pump. Using endoscopically harvested right greater saphenous vein and left internal mammary artery.    HERNIA REPAIR     INCISION AND DRAINAGE OF WOUND  2006   [place]   INSERTION OF DIALYSIS CATHETER Right 07/25/2020   Procedure: INSERTION OF DIALYSIS CATHETER;  Surgeon: Virl Cagey, MD;  Location: [facility];  Service: General;  Laterality: Right;   INTRAOPERATIVE TRANSESOPHAGEAL ECHOCARDIOGRAM  12/27/2011   Global hypokinesis with EF of 40-45%, improved LAD distribution wall motion.   LEFT HEART CATHETERIZATION WITH CORONARY ANGIOGRAM N/A 12/13/2011   Procedure: LEFT HEART CATHETERIZATION WITH CORONARY ANGIOGRAM;  Surgeon: Leonie Man, MD;  Location: [facility] C[facility];  Service: Cardiovascular;  Laterality: N/A;   LUMBAR FUSION     MASS EXCISION  10/24/11   R arm   RIGHT HEART CATH N/A 07/12/2020   Procedure: RIGHT HEART CATH;  Surgeon: Belva Crome, MD;  Location: [facility];  Service: Cardiovascular;  Laterality: N/A;   TRANSESOPHAGEAL ECHOCARDIOGRAM  2013    Current Outpatient Medications  Medication Sig Dispense Refill   acetaminophen (TYLENOL) 500 MG tablet Take 1,000 mg by  mouth every 6 (six) hours as needed for moderate pain or headache.     ALPRAZolam (XANAX) 0.5 MG tablet Take 1 tablet (0.5 mg total) by mouth at bedtime as needed for sleep. 30 tablet 0   aspirin EC 81 MG tablet Take 81 mg by mouth daily.     Cholecalciferol (VITAMIN D3) 25 MCG (1000 UT) CAPS Take 1 capsule (1,000 Units total) by mouth daily at 6 (six) AM. 60 capsule 6   desvenlafaxine (PRISTIQ) 25 MG 24 hr tablet TAKE ONE TABLET BY MOUTH DAILY. 30 tablet 0   diltiazem (CARDIZEM CD) 120 MG 24 hr capsule Take 120 mg by mouth daily.     doxazosin (CARDURA) 8 MG tablet Take 8  mg by mouth daily.     hydrALAZINE (APRESOLINE) 25 MG tablet Take 25 mg by mouth 3 (three) times daily.     levothyroxine (SYNTHROID) 25 MCG tablet Take 1 tablet (25 mcg total) by mouth daily before breakfast. 30 tablet 6   multivitamin (RENA-VIT) TABS tablet Take 1 tablet by mouth daily.     pantoprazole (PROTONIX) 40 MG tablet TAKE ONE TABLET BY MOUTH TWICE A DAY 180 tablet 3   pravastatin (PRAVACHOL) 20 MG tablet Take 1 tablet (20 mg total) by mouth daily. 90 tablet 3   sacubitril-valsartan (ENTRESTO) 97-103 MG Take 1 tablet by mouth 2 (two) times daily. 60 tablet 6   torsemide (DEMADEX) 20 MG tablet Take 20 mg by mouth daily as needed.     Dulaglutide (TRULICITY) 9.79 GX/2.1JH SOPN Inject 0.75 mg into the skin once a week. On Thursdays. (Patient not taking: Reported on 08/03/2021) 2 mL 11   No current facility-administered medications for this visit.   Allergies:  No known allergies   Social History: The patient  reports that he quit smoking about 34 years ago. His smoking use included cigarettes. He has never used smokeless tobacco. He reports that he does not drink alcohol and does not use drugs.   Family History: The patient's family history includes Cancer in his father and mother.   ROS:  Please see the history of present illness. Otherwise, complete review of systems is positive for none.  All other systems are  reviewed and negative.   Physical Exam: VS:  BP 136/64   Pulse (!) 54   Ht 5\' 8"  (1.727 m)   Wt 139 lb 12.8 oz (63.4 kg)   SpO2 98%   BMI 21.26 kg/m , BMI Body mass index is 21.26 kg/m.  Wt Readings from Last 3 Encounters:  08/03/21 139 lb 12.8 oz (63.4 kg)  07/17/21 139 lb (63 kg)  05/04/21 145 lb (65.8 kg)    General: Patient appears comfortable at rest. Neck: Supple, no elevated JVP or carotid bruits, no thyromegaly. Lungs: Clear to auscultation, nonlabored breathing at rest. Cardiac: Regular rate and rhythm, no S3 or significant systolic murmur, no pericardial rub. Extremities: No pitting edema, distal pulses 2+. Skin: Warm and dry. Musculoskeletal: No kyphosis. Neuropsychiatric: Alert and oriented x3, affect grossly appropriate.  ECG:  .  08/03/2021 EKG sinus bradycardia rate 51, inferior infarct, age undetermined, cannot rule out anterior infarct age undetermined.  Recent Labwork: 12/03/2020: ALT 22; AST 22 04/20/2021: BUN 32; Creatinine, Ser 3.18; Hemoglobin 12.1; Magnesium 2.2; Platelets 230; Potassium 4.2; Sodium 136; TSH 8.766     Component Value Date/Time   CHOL 104 07/13/2020 0118   TRIG 62 07/13/2020 0118   HDL 25 (L) 07/13/2020 0118   CHOLHDL 4.2 07/13/2020 0118   VLDL 12 07/13/2020 0118   LDLCALC 67 07/13/2020 0118   LDLCALC 73 05/01/2019 0945    Other Studies Reviewed Today:   Carotid artery duplex 05/13/2021 Right Carotid: Velocities in the right ICA are consistent with a 1-39% stenosis. The ECA appears >50% stenosed. Left Carotid: Velocities in the left ICA are consistent with a 40-59% stenosis. The ECA appears >50% stenosed. Vertebrals: Bilateral vertebral arteries demonstrate antegrade flow. Subclavians: Normal flow hemodynamics were seen in bilateral subclavian arteries.   Echocardiogram 11/17/2020 1. Left ventricular ejection fraction, by estimation, is 35 to 40%. The left ventricle has normal function. The left ventricle demonstrates global  hypokinesis. Left ventricular diastolic parameters are consistent with Grade I diastolic dysfunction (impaired relaxation). Elevated  left atrial pressure. The average left ventricular global longitudinal strain is -13.8 %. The global longitudinal strain is abnormal. 2. Right ventricular systolic function is mildly reduced. The right ventricular size is normal. 3. Left atrial size was moderately dilated. 4. Right atrial size was mildly dilated. 5. The mitral valve is normal in structure. Mild mitral valve regurgitation. No evidence of mitral stenosis. 6. The aortic valve is tricuspid. Aortic valve regurgitation is not visualized. No aortic stenosis is present. Comparison(s): Echocardiogram done 07/11/20 showed an EF of 25%.  Assessment and Plan:  1. Shortness of breath   2. Hypothyroidism, unspecified type   3. Vitamin D deficiency   4. Decreased blood pressure, not hypotension   5. Medication management   6. PVC's (premature ventricular contractions)   7. Bilateral carotid artery stenosis   8. 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[place])   9. Ischemic cardiomyopathy      1. Shortness of breath Denies any significant shortness of breath at the moment.  Continue Entresto 97/103 p.o. twice daily.  Continue torsemide 20 mg daily.  Continue hydralazine 25 mg p.o. twice daily.  2. Weakness/hypothyroidism At last visit we ordered lab work due to his fatigue. His thyroid function tests were abnormal. TSH 8.766 , T3 69, T4 1.18.  Plans were to stop amiodarone and start Cardizem.  Plan was to start levothyroxine 25 mcg p.o. daily.  Please stop amiodarone.  Start Cardizem CD 120 mg daily.  Start levothyroxine 25 mcg p.o. daily.  At last follow-up f we ordered thyroid panel including TSH, T3, T4 in 8 weeks after starting.  Patient stated he would have lab work drawn at his PCP office.  I advised him to follow-up with PCP for management of hypothyroidism.  It appears he has not had follow-up  thyroid function test follow-up labs.  Advised him we would need to follow-up on this.  Please get thyroid panel.  3. No energy /vitamin D deficiency. He had complained of no energy/fatigue over the prior couple of weeks at last visit.  Both his thyroid function and vitamin D levels were low.  We started levothyroxine 25 mcg p.o. daily.  Restarted cholecalciferol 25 mcg p.o. daily.  He was to follow with his primary care provider for management of hypothyroidism and vitamin D deficiency.  States he is feeling much better since being started on thyroid medication, discontinuing amiodarone and starting vitamin D  4. Decreased blood pressure, not hypotension At last visit he had stopped his evening dose of Entresto due to low blood pressures.  We decreased his hydralazine down to 25 mg p.o. 3 times daily.  Blood pressure today is 138/74.  Continue hydralazine 25 mg p.o. 3 times daily.  5. Medication management At last visit we decreased his hydralazine dosage to 25 mg p.o. 3 times daily due to lower blood pressures.  Blood pressure today 138/74.  Continue hydralazine 25 mg p.o. twice daily.  6.  PVCs. At last visit we discontinued at prior visit we discontinued his amiodarone due to possible contribution to abnormal thyroid studies.  We started Cardizem CD 120 mg p.o. daily to manage PVCs.  Currently denies any palpitations or arrhythmias.  States he is feeling much better since stopping amiodarone and starting Cardizem.  7.  Carotid stenosis. Carotid study on 05/12/2019 2R ICA 1 to 39%.  ECA appears greater than 87% stenosis, LICA 40 to 86%, ECA greater than 50% stenosis..  Bilateral vertebral arteries demonstrate antegrade flow.  Normal flow hemodynamics seen  in bilateral subclavian arteries.  8.  ESRD He states his PD is going well.  Had a recent follow-up with Dr. Donnetta Hutching who was contemplating placing an AV fistula.  Patient and wife stated after vascular study Dr. Donnetta Hutching had decided anatomy was not  amenable to placement of an AV fistula.  He will continue PD.  9.  Ischemic cardiomyopathy At prior visit his Delene Loll was increased to 97/103 p.o. twice daily.  Continue Entresto 97/103 mg p.o. twice daily. Continue torsemide 20 mg daily.  Continue hydralazine 25 mg p.o. 3 times daily.  Continue aspirin 81 mg daily.  Medication Adjustments/Labs and Tests Ordered: Current medicines are reviewed at length with the patient today.  Concerns regarding medicines are outlined above.   Disposition: Follow-up with Dr. Harl Bowie or APP 6 months  Signed, Levell July, NP 08/03/2021 9:12 AM    [facility], [place], [place] [address] Phone: [phone]; Fax: [phone]

## 2021-08-02 DIAGNOSIS — D509 Iron deficiency anemia, unspecified: Secondary | ICD-10-CM | POA: Diagnosis not present

## 2021-08-02 DIAGNOSIS — N186 End stage renal disease: Secondary | ICD-10-CM | POA: Diagnosis not present

## 2021-08-02 DIAGNOSIS — Z992 Dependence on renal dialysis: Secondary | ICD-10-CM | POA: Diagnosis not present

## 2021-08-03 ENCOUNTER — Encounter: Payer: Self-pay | Admitting: Family Medicine

## 2021-08-03 ENCOUNTER — Ambulatory Visit (INDEPENDENT_AMBULATORY_CARE_PROVIDER_SITE_OTHER): Payer: Medicare Other | Admitting: Family Medicine

## 2021-08-03 VITALS — BP 136/64 | HR 54 | Ht 68.0 in | Wt 139.8 lb

## 2021-08-03 DIAGNOSIS — I6523 Occlusion and stenosis of bilateral carotid arteries: Secondary | ICD-10-CM

## 2021-08-03 DIAGNOSIS — E559 Vitamin D deficiency, unspecified: Secondary | ICD-10-CM | POA: Diagnosis not present

## 2021-08-03 DIAGNOSIS — Z992 Dependence on renal dialysis: Secondary | ICD-10-CM

## 2021-08-03 DIAGNOSIS — I255 Ischemic cardiomyopathy: Secondary | ICD-10-CM

## 2021-08-03 DIAGNOSIS — E039 Hypothyroidism, unspecified: Secondary | ICD-10-CM

## 2021-08-03 DIAGNOSIS — N186 End stage renal disease: Secondary | ICD-10-CM

## 2021-08-03 DIAGNOSIS — R0602 Shortness of breath: Secondary | ICD-10-CM

## 2021-08-03 DIAGNOSIS — Z79899 Other long term (current) drug therapy: Secondary | ICD-10-CM | POA: Diagnosis not present

## 2021-08-03 DIAGNOSIS — I493 Ventricular premature depolarization: Secondary | ICD-10-CM

## 2021-08-03 DIAGNOSIS — R031 Nonspecific low blood-pressure reading: Secondary | ICD-10-CM

## 2021-08-03 NOTE — Patient Instructions (Signed)
Medication Instructions:  Your physician recommends that you continue on your current medications as directed. Please refer to the Current Medication list given to you today.  Labwork: TSH, Free T3 & T4 08/03/2021 or 08/04/2021 Non-fasting [facility] [employer] [facility] or [employer]  Testing/Procedures: none  Follow-Up: Your physician recommends that you schedule a follow-up appointment in: 6 months  Any Other Special Instructions Will Be Listed Below (If Applicable).  If you need a refill on your cardiac medications before your next appointment, please call your pharmacy.

## 2021-08-04 ENCOUNTER — Other Ambulatory Visit (HOSPITAL_COMMUNITY)
Admission: RE | Admit: 2021-08-04 | Discharge: 2021-08-04 | Disposition: A | Discharge status: Home / Self Care | Payer: Medicare Other | Source: Ambulatory Visit | Attending: Family Medicine | Admitting: Family Medicine

## 2021-08-04 DIAGNOSIS — I6523 Occlusion and stenosis of bilateral carotid arteries: Secondary | ICD-10-CM | POA: Insufficient documentation

## 2021-08-04 DIAGNOSIS — E039 Hypothyroidism, unspecified: Secondary | ICD-10-CM | POA: Insufficient documentation

## 2021-08-04 DIAGNOSIS — I255 Ischemic cardiomyopathy: Secondary | ICD-10-CM | POA: Diagnosis not present

## 2021-08-04 DIAGNOSIS — Z79899 Other long term (current) drug therapy: Secondary | ICD-10-CM | POA: Diagnosis not present

## 2021-08-04 DIAGNOSIS — I493 Ventricular premature depolarization: Secondary | ICD-10-CM | POA: Insufficient documentation

## 2021-08-04 DIAGNOSIS — Z992 Dependence on renal dialysis: Secondary | ICD-10-CM | POA: Diagnosis not present

## 2021-08-04 DIAGNOSIS — N186 End stage renal disease: Secondary | ICD-10-CM | POA: Diagnosis not present

## 2021-08-04 DIAGNOSIS — E559 Vitamin D deficiency, unspecified: Secondary | ICD-10-CM | POA: Diagnosis not present

## 2021-08-04 DIAGNOSIS — R0602 Shortness of breath: Secondary | ICD-10-CM | POA: Insufficient documentation

## 2021-08-04 DIAGNOSIS — R031 Nonspecific low blood-pressure reading: Secondary | ICD-10-CM | POA: Diagnosis not present

## 2021-08-04 LAB — TSH: TSH: 7.226 u[IU]/mL — ABNORMAL HIGH (ref 0.350–4.500)

## 2021-08-04 LAB — T4, FREE: Free T4: 1.04 ng/dL (ref 0.61–1.12)

## 2021-08-05 DIAGNOSIS — N186 End stage renal disease: Secondary | ICD-10-CM | POA: Diagnosis not present

## 2021-08-05 DIAGNOSIS — Z992 Dependence on renal dialysis: Secondary | ICD-10-CM | POA: Diagnosis not present

## 2021-08-05 LAB — T3, FREE: T3, Free: 1.9 pg/mL — ABNORMAL LOW (ref 2.0–4.4)

## 2021-08-06 DIAGNOSIS — N186 End stage renal disease: Secondary | ICD-10-CM | POA: Diagnosis not present

## 2021-08-06 DIAGNOSIS — Z992 Dependence on renal dialysis: Secondary | ICD-10-CM | POA: Diagnosis not present

## 2021-08-07 DIAGNOSIS — N186 End stage renal disease: Secondary | ICD-10-CM | POA: Diagnosis not present

## 2021-08-07 DIAGNOSIS — Z992 Dependence on renal dialysis: Secondary | ICD-10-CM | POA: Diagnosis not present

## 2021-08-08 DIAGNOSIS — N186 End stage renal disease: Secondary | ICD-10-CM | POA: Diagnosis not present

## 2021-08-08 DIAGNOSIS — Z992 Dependence on renal dialysis: Secondary | ICD-10-CM | POA: Diagnosis not present

## 2021-08-09 ENCOUNTER — Telehealth: Payer: Self-pay | Admitting: *Deleted

## 2021-08-09 DIAGNOSIS — N186 End stage renal disease: Secondary | ICD-10-CM | POA: Diagnosis not present

## 2021-08-09 DIAGNOSIS — Z992 Dependence on renal dialysis: Secondary | ICD-10-CM | POA: Diagnosis not present

## 2021-08-09 MED ORDER — LEVOTHYROXINE SODIUM 50 MCG PO TABS: 50.0000 ug | ORAL_TABLET | Freq: Every day | ORAL | 1 refills | Status: DC

## 2021-08-09 NOTE — Telephone Encounter (Signed)
Patient's wife [name] informed and verbalized understanding of plan. Copy sent to PCP

## 2021-08-09 NOTE — Telephone Encounter (Signed)
-----   Message from Merlene Laughter, RN sent at 08/08/2021 11:26 AM EST -----  ----- Message ----- From: Verta Ellen., NP Sent: 08/06/2021   9:23 PM EST To: Laurine Blazer, LPN  Please call the patient and let him know that thyroid studies show his thyroid medication Likely needs to be increased.  Please increase his levothyroxine to 50 mcg p.o. daily.  Have him follow-up with his primary care provider for future adjustment in medication.  Thank you  Verta Ellen, NP  08/06/2021 9:20 PM

## 2021-08-10 ENCOUNTER — Other Ambulatory Visit: Payer: Self-pay | Admitting: *Deleted

## 2021-08-10 DIAGNOSIS — E039 Hypothyroidism, unspecified: Secondary | ICD-10-CM

## 2021-08-10 DIAGNOSIS — N186 End stage renal disease: Secondary | ICD-10-CM | POA: Diagnosis not present

## 2021-08-10 DIAGNOSIS — Z992 Dependence on renal dialysis: Secondary | ICD-10-CM | POA: Diagnosis not present

## 2021-08-11 DIAGNOSIS — N186 End stage renal disease: Secondary | ICD-10-CM | POA: Diagnosis not present

## 2021-08-11 DIAGNOSIS — Z992 Dependence on renal dialysis: Secondary | ICD-10-CM | POA: Diagnosis not present

## 2021-08-12 DIAGNOSIS — N186 End stage renal disease: Secondary | ICD-10-CM | POA: Diagnosis not present

## 2021-08-12 DIAGNOSIS — Z992 Dependence on renal dialysis: Secondary | ICD-10-CM | POA: Diagnosis not present

## 2021-08-13 DIAGNOSIS — N186 End stage renal disease: Secondary | ICD-10-CM | POA: Diagnosis not present

## 2021-08-13 DIAGNOSIS — Z992 Dependence on renal dialysis: Secondary | ICD-10-CM | POA: Diagnosis not present

## 2021-08-14 DIAGNOSIS — N186 End stage renal disease: Secondary | ICD-10-CM | POA: Diagnosis not present

## 2021-08-14 DIAGNOSIS — Z992 Dependence on renal dialysis: Secondary | ICD-10-CM | POA: Diagnosis not present

## 2021-08-15 ENCOUNTER — Ambulatory Visit (HOSPITAL_COMMUNITY): Payer: Medicare Other | Attending: Family Medicine

## 2021-08-15 ENCOUNTER — Other Ambulatory Visit: Payer: Self-pay

## 2021-08-15 ENCOUNTER — Encounter (HOSPITAL_COMMUNITY): Payer: Self-pay

## 2021-08-15 DIAGNOSIS — R293 Abnormal posture: Secondary | ICD-10-CM | POA: Insufficient documentation

## 2021-08-15 DIAGNOSIS — M5134 Other intervertebral disc degeneration, thoracic region: Secondary | ICD-10-CM | POA: Insufficient documentation

## 2021-08-15 DIAGNOSIS — Z992 Dependence on renal dialysis: Secondary | ICD-10-CM | POA: Diagnosis not present

## 2021-08-15 DIAGNOSIS — N186 End stage renal disease: Secondary | ICD-10-CM | POA: Diagnosis not present
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
## 2021-08-16 DIAGNOSIS — N186 End stage renal disease: Secondary | ICD-10-CM | POA: Diagnosis not present

## 2021-08-16 DIAGNOSIS — Z992 Dependence on renal dialysis: Secondary | ICD-10-CM | POA: Diagnosis not present

## 2021-08-17 DIAGNOSIS — N186 End stage renal disease: Secondary | ICD-10-CM | POA: Diagnosis not present

## 2021-08-17 DIAGNOSIS — Z992 Dependence on renal dialysis: Secondary | ICD-10-CM | POA: Diagnosis not present

## 2021-08-18 ENCOUNTER — Telehealth: Payer: Self-pay | Admitting: Family Medicine

## 2021-08-18 DIAGNOSIS — N186 End stage renal disease: Secondary | ICD-10-CM | POA: Diagnosis not present

## 2021-08-18 DIAGNOSIS — Z992 Dependence on renal dialysis: Secondary | ICD-10-CM | POA: Diagnosis not present

## 2021-08-18 NOTE — Telephone Encounter (Signed)
I attempted to leave message for patient to call back and schedule Medicare Annual Wellness Visit (AWV) in office. No voice mail.  If not able to come in office, please offer to do virtually or by telephone.  Left office number and my jabber [phone].  Last AWV:05/01/2019  Please schedule at anytime with Nurse Health Advisor.

## 2021-08-19 DIAGNOSIS — N186 End stage renal disease: Secondary | ICD-10-CM | POA: Diagnosis not present

## 2021-08-19 DIAGNOSIS — Z992 Dependence on renal dialysis: Secondary | ICD-10-CM | POA: Diagnosis not present

## 2021-08-20 DIAGNOSIS — N186 End stage renal disease: Secondary | ICD-10-CM | POA: Diagnosis not present

## 2021-08-20 DIAGNOSIS — Z992 Dependence on renal dialysis: Secondary | ICD-10-CM | POA: Diagnosis not present

## 2021-08-21 DIAGNOSIS — N186 End stage renal disease: Secondary | ICD-10-CM | POA: Diagnosis not present

## 2021-08-21 DIAGNOSIS — Z992 Dependence on renal dialysis: Secondary | ICD-10-CM | POA: Diagnosis not present

## 2021-08-22 ENCOUNTER — Encounter (HOSPITAL_COMMUNITY): Payer: Self-pay | Admitting: Physical Therapy

## 2021-08-22 ENCOUNTER — Ambulatory Visit (HOSPITAL_COMMUNITY): Payer: Medicare Other | Admitting: Physical Therapy

## 2021-08-22 ENCOUNTER — Other Ambulatory Visit: Payer: Self-pay

## 2021-08-22 DIAGNOSIS — M5134 Other intervertebral disc degeneration, thoracic region: Secondary | ICD-10-CM | POA: Diagnosis not present

## 2021-08-22 DIAGNOSIS — N186 End stage renal disease: Secondary | ICD-10-CM | POA: Diagnosis not present

## 2021-08-22 DIAGNOSIS — R293 Abnormal posture: Secondary | ICD-10-CM

## 2021-08-22 DIAGNOSIS — Z992 Dependence on renal dialysis: Secondary | ICD-10-CM | POA: Diagnosis not present
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
## 2021-08-23 DIAGNOSIS — N186 End stage renal disease: Secondary | ICD-10-CM | POA: Diagnosis not present

## 2021-08-23 DIAGNOSIS — Z992 Dependence on renal dialysis: Secondary | ICD-10-CM | POA: Diagnosis not present

## 2021-08-24 DIAGNOSIS — N186 End stage renal disease: Secondary | ICD-10-CM | POA: Diagnosis not present

## 2021-08-24 DIAGNOSIS — Z992 Dependence on renal dialysis: Secondary | ICD-10-CM | POA: Diagnosis not present

## 2021-08-25 DIAGNOSIS — N186 End stage renal disease: Secondary | ICD-10-CM | POA: Diagnosis not present

## 2021-08-25 DIAGNOSIS — Z992 Dependence on renal dialysis: Secondary | ICD-10-CM | POA: Diagnosis not present

## 2021-08-26 DIAGNOSIS — N186 End stage renal disease: Secondary | ICD-10-CM | POA: Diagnosis not present

## 2021-08-26 DIAGNOSIS — Z992 Dependence on renal dialysis: Secondary | ICD-10-CM | POA: Diagnosis not present

## 2021-08-27 DIAGNOSIS — Z992 Dependence on renal dialysis: Secondary | ICD-10-CM | POA: Diagnosis not present

## 2021-08-27 DIAGNOSIS — N186 End stage renal disease: Secondary | ICD-10-CM | POA: Diagnosis not present

## 2021-08-28 ENCOUNTER — Other Ambulatory Visit: Payer: Self-pay

## 2021-08-28 ENCOUNTER — Ambulatory Visit (HOSPITAL_COMMUNITY): Payer: Medicare Other

## 2021-08-28 DIAGNOSIS — N186 End stage renal disease: Secondary | ICD-10-CM | POA: Diagnosis not present

## 2021-08-28 DIAGNOSIS — Z992 Dependence on renal dialysis: Secondary | ICD-10-CM | POA: Diagnosis not present

## 2021-08-28 DIAGNOSIS — R293 Abnormal posture: Secondary | ICD-10-CM | POA: Diagnosis not present

## 2021-08-28 DIAGNOSIS — M5134 Other intervertebral disc degeneration, thoracic region: Secondary | ICD-10-CM | POA: Diagnosis not present

## 2021-08-28 NOTE — Therapy (Signed)
[name], [name], [address] Phone: [phone]   Fax:  [phone]  Physical Therapy Treatment  Patient Details  Name: Bradley Black MRN: [id number] Date of Birth: Nov 06, 1945 Referring Provider (PT): Jenna Luo, MD   Encounter Date: 08/28/2021   PT End of Session - 08/28/21 1114     Visit Number 3    Number of Visits 4    Date for PT Re-Evaluation 09/12/21    Authorization Type [employer] Medicare, no auth, no VL    PT Start Time 1115    PT Stop Time 1200    PT Time Calculation (min) 45 min    Activity Tolerance Patient tolerated treatment well    Behavior During Therapy [facility] for tasks assessed/performed             Past Medical History:  Diagnosis Date   BPH (benign prostatic hyperplasia)    CAD S/P percutaneous coronary angioplasty    a. PTCA of [place] b. PCI with BMS to LAD in 1997 c. RCA PCI [place] d. s/p CABG in 11/2011 with LIMA-LAD, SVG-PDA, SVG-OM2, and SVG-D1   Chronic back pain    CKD (chronic kidney disease), stage III (HCC)    Cyst of bursa    R shoulder   Diabetic retinopathy ([place])    DM (diabetes mellitus), type 2 with renal complications (HCC)    Essential hypertension    Frequent PVCs    GERD (gastroesophageal reflux disease)    Ischemic cardiomyopathy 11/2011   Intra-OP TEE: EF 40-45%, no regional WMA; improved Anterior WM post CABG.   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   LIMA to LAD, SVG to D1, SVG to OM2, SVG to PDA, EVH via right thigh and leg    Past Surgical History:  Procedure Laterality Date   AV FISTULA PLACEMENT Left 07/28/2020   Procedure: LEFT ARM ARTERIOVENOUS (AV) FISTULA  CREATION;  Surgeon: Rosetta Posner, MD;  Location: [facility];  Service: Vascular;  Laterality: Left;   [place]  2013   CORONARY ANGIOPLASTY  1994   OM   CORONARY ANGIOPLASTY  1997   LAD   CORONARY  ANGIOPLASTY WITH STENT PLACEMENT  1999   RCA   CORONARY ANGIOPLASTY WITH STENT PLACEMENT  2001   RCA   CORONARY ARTERY BYPASS GRAFT  12/27/2011   Procedure: CORONARY ARTERY BYPASS GRAFTING (CABG);  Surgeon: Rexene Alberts, MD;  Location: [place];  Service: Open Heart Surgery;  Laterality: N/A;  Times four. On pump. Using endoscopically harvested right greater saphenous vein and left internal mammary artery.    HERNIA REPAIR     INCISION AND DRAINAGE OF WOUND  2006   [place]   INSERTION OF DIALYSIS CATHETER Right 07/25/2020   Procedure: INSERTION OF DIALYSIS CATHETER;  Surgeon: Virl Cagey, MD;  Location: [facility];  Service: General;  Laterality: Right;   INTRAOPERATIVE TRANSESOPHAGEAL ECHOCARDIOGRAM  12/27/2011   Global hypokinesis with EF of 40-45%, improved LAD distribution wall motion.   LEFT HEART CATHETERIZATION WITH CORONARY ANGIOGRAM N/A 12/13/2011   Procedure: LEFT HEART CATHETERIZATION WITH CORONARY ANGIOGRAM;  Surgeon: Leonie Man, MD;  Location: [facility] CATH LAB;  Service: Cardiovascular;  Laterality: N/A;   LUMBAR FUSION     MASS EXCISION  10/24/11   R arm   RIGHT HEART CATH N/A 07/12/2020  Procedure: RIGHT HEART CATH;  Surgeon: Belva Crome, MD;  Location: [place] CV LAB;  Service: Cardiovascular;  Laterality: N/A;   TRANSESOPHAGEAL ECHOCARDIOGRAM  2013    There were no vitals filed for this visit.   Subjective Assessment - 08/28/21 1118     Subjective Notes exercises are going well but not much change in pain symptoms, low to high thoracic and neck    Limitations Lifting;House hold activities    Patient Stated Goals decrease pain with activity    Currently in Pain? Yes    Pain Score 8     Pain Location Back    Pain Orientation Mid    Pain Descriptors / Indicators Throbbing    Pain Type Chronic pain                [facility] PT Assessment - 08/28/21 0001       Assessment   Medical Diagnosis DDD thoracic    Referring Provider (PT) Jenna Luo, MD                            [facility] Adult PT Treatment/Exercise - 08/28/21 0001       Exercises   Exercises Neck    Other Exercises  supine thoracic extension with horizontal towel roll, supine chin retraction, seated, wall slides for extension      Neck Exercises: Prone   Neck Retraction 10 reps;3 secs      Lumbar Exercises: Stretches   Other Lumbar Stretch Exercise wall slides BUE 2x10      Lumbar Exercises: Standing   Other Standing Lumbar Exercises pec stretch corner x10 5" holds      Lumbar Exercises: Supine   Other Supine Lumbar Exercises chin tuck x20 5" holds      Manual Therapy   Manual Therapy Joint mobilization    Manual therapy comments manual therapy completed seperately from all other interventions this date of service    Joint Mobilization PA grade 2-3 T-spine to facilitate facet extension to promote thoracic extension and neutral head alignment                       PT Short Term Goals - 08/15/21 1159       PT SHORT TERM GOAL #1   Title Patient will be independent with HEP in order to improve functional outcomes.    Time 2    Period Weeks    Status New    Target Date 08/29/21      PT SHORT TERM GOAL #2   Title Patient will report at least 25% improvement in symptoms for improved quality of life.    Baseline 8/10 t-spine pain with showering    Time 2    Period Weeks    Status New    Target Date 08/29/21      PT SHORT TERM GOAL #3   Title Demo improved thoracic extension mobility as evidenced by one-fist width from occiput to bed surface when in supine    Baseline two-fists width    Time 2    Period Weeks    Status New    Target Date 08/29/21               PT Long Term Goals - 08/15/21 1202       PT LONG TERM GOAL #1   Title Patient will report at least 50% improvement in symptoms for improved quality of life.  Baseline 8/10 t-spine pain with showering    Time 4    Period Weeks    Status New    Target Date  09/12/21      PT LONG TERM GOAL #2   Title Demo good postural awareness 100% of observations    Baseline thoracic kyphosis, forward head, rounded shoulders    Time 4    Period Weeks    Status New    Target Date 09/12/21      PT LONG TERM GOAL #3   Title Patient will improve FOTO score by at least 5 points in order to indicate improved tolerance to activity.    Baseline 47.2% function    Time 2    Period Weeks    Status New    Target Date 09/12/21                   Plan - 08/28/21 1156     Clinical Impression Statement Continues to report 8/10 back to neck pain throughout the day.  Demonstrates significant postural deformity/abnormality with t-spine hypomobility for extension and compensatory forward head. Continued session for HEP refinement/postural re-education to improve cervico-thoracic mobility, strength, and reduce pain    Personal Factors and Comorbidities Age;Time since onset of injury/illness/exacerbation    Examination-Activity Limitations Carry;Lift;Stand;Reach Overhead;Locomotion Level    Examination-Participation Restrictions Cleaning;Community Activity;Yard Work    Stability/Clinical Decision Making Stable/Uncomplicated    Rehab Potential Good    PT Frequency 1x / week    PT Duration 4 weeks    PT Treatment/Interventions ADLs/Self Care Home Management;Electrical Stimulation;Moist Heat;Gait training;Functional mobility training;Therapeutic activities;Therapeutic exercise;Balance training;Neuromuscular re-education;Patient/family education;Manual techniques;Passive range of motion;Dry needling;Spinal Manipulations;Joint Manipulations    PT Next Visit Plan postural re-education    PT Home Exercise Plan t-spine mobilization stretches, wall slides, TRA activation long exhale, pec stretch, posture holds, book stretch    Consulted and Agree with Plan of Care Patient             Patient will benefit from skilled therapeutic intervention in order to improve the  following deficits and impairments:  Decreased range of motion, Decreased strength, Hypomobility, Postural dysfunction, Pain  Visit Diagnosis: Degeneration of thoracic intervertebral disc  Abnormal posture     Problem List Patient Active Problem List   Diagnosis Date Noted   ESRD (end stage renal disease) ([place])    Malnutrition of moderate degree 07/22/2020   CKD (chronic kidney disease), stage V ([place]) 07/21/2020   Dehydration 07/21/2020   Hyponatremia 07/21/2020   Prolonged QT interval 07/21/2020   Generalized weakness 07/21/2020   Tremors of nervous system 07/20/2020   Hypocalcemia    Myoclonic jerking 07/18/2020   Biventricular heart failure (HCC)    Frequent PVCs    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HCC)    SOB (shortness of breath) 07/08/2020   Elevated LFTs 07/08/2020   Left carotid artery stenosis    IDA (iron deficiency anemia) 04/13/2020   GERD (gastroesophageal reflux disease)    Essential hypertension 07/24/2019   Anemia in chronic kidney disease 07/24/2019   Chronic kidney disease, stage IV (severe) ([place]) 07/24/2019   Hyperkalemia 07/24/2019   Acquired trigger finger of right middle finger 12/12/2017   Type 2 diabetes mellitus with diabetic chronic kidney disease ([place]) 07/19/2016   Cervicalgia 03/08/2014   Whiplash injury to neck 03/08/2014   Aortic heart murmur 10/18/2013   Acute myocardial infarction, History of:    Hyperlipidemia LDL goal <70    Anemia 04/23/2013  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

## 2021-08-29 ENCOUNTER — Other Ambulatory Visit: Payer: Self-pay | Admitting: Family Medicine

## 2021-08-29 DIAGNOSIS — Z992 Dependence on renal dialysis: Secondary | ICD-10-CM | POA: Diagnosis not present

## 2021-08-29 DIAGNOSIS — N186 End stage renal disease: Secondary | ICD-10-CM | POA: Diagnosis not present

## 2021-08-30 DIAGNOSIS — N186 End stage renal disease: Secondary | ICD-10-CM | POA: Diagnosis not present

## 2021-08-30 DIAGNOSIS — Z992 Dependence on renal dialysis: Secondary | ICD-10-CM | POA: Diagnosis not present

## 2021-08-31 DIAGNOSIS — Z992 Dependence on renal dialysis: Secondary | ICD-10-CM | POA: Diagnosis not present

## 2021-08-31 DIAGNOSIS — N186 End stage renal disease: Secondary | ICD-10-CM | POA: Diagnosis not present

## 2021-09-01 DIAGNOSIS — N186 End stage renal disease: Secondary | ICD-10-CM | POA: Diagnosis not present

## 2021-09-01 DIAGNOSIS — Z992 Dependence on renal dialysis: Secondary | ICD-10-CM | POA: Diagnosis not present

## 2021-09-02 DIAGNOSIS — N186 End stage renal disease: Secondary | ICD-10-CM | POA: Diagnosis not present

## 2021-09-02 DIAGNOSIS — Z992 Dependence on renal dialysis: Secondary | ICD-10-CM | POA: Diagnosis not present

## 2021-09-03 DIAGNOSIS — Z992 Dependence on renal dialysis: Secondary | ICD-10-CM | POA: Diagnosis not present

## 2021-09-03 DIAGNOSIS — N186 End stage renal disease: Secondary | ICD-10-CM | POA: Diagnosis not present

## 2021-09-04 ENCOUNTER — Ambulatory Visit (HOSPITAL_COMMUNITY): Payer: Medicare Other

## 2021-09-04 DIAGNOSIS — D509 Iron deficiency anemia, unspecified: Secondary | ICD-10-CM | POA: Diagnosis not present

## 2021-09-04 DIAGNOSIS — Z992 Dependence on renal dialysis: Secondary | ICD-10-CM | POA: Diagnosis not present

## 2021-09-04 DIAGNOSIS — N186 End stage renal disease: Secondary | ICD-10-CM | POA: Diagnosis not present

## 2021-09-05 DIAGNOSIS — N186 End stage renal disease: Secondary | ICD-10-CM | POA: Diagnosis not present

## 2021-09-05 DIAGNOSIS — Z992 Dependence on renal dialysis: Secondary | ICD-10-CM | POA: Diagnosis not present

## 2021-09-07 DIAGNOSIS — Z992 Dependence on renal dialysis: Secondary | ICD-10-CM | POA: Diagnosis not present

## 2021-09-07 DIAGNOSIS — N186 End stage renal disease: Secondary | ICD-10-CM | POA: Diagnosis not present

## 2021-09-08 DIAGNOSIS — Z992 Dependence on renal dialysis: Secondary | ICD-10-CM | POA: Diagnosis not present

## 2021-09-08 DIAGNOSIS — N186 End stage renal disease: Secondary | ICD-10-CM | POA: Diagnosis not present

## 2021-09-09 DIAGNOSIS — Z992 Dependence on renal dialysis: Secondary | ICD-10-CM | POA: Diagnosis not present

## 2021-09-09 DIAGNOSIS — N186 End stage renal disease: Secondary | ICD-10-CM | POA: Diagnosis not present

## 2021-09-10 DIAGNOSIS — Z992 Dependence on renal dialysis: Secondary | ICD-10-CM | POA: Diagnosis not present

## 2021-09-10 DIAGNOSIS — N186 End stage renal disease: Secondary | ICD-10-CM | POA: Diagnosis not present

## 2021-09-11 ENCOUNTER — Ambulatory Visit (HOSPITAL_COMMUNITY): Payer: Medicare Other | Attending: Family Medicine

## 2021-09-11 ENCOUNTER — Telehealth (HOSPITAL_COMMUNITY): Payer: Self-pay

## 2021-09-11 DIAGNOSIS — Z992 Dependence on renal dialysis: Secondary | ICD-10-CM | POA: Diagnosis not present

## 2021-09-11 DIAGNOSIS — N186 End stage renal disease: Secondary | ICD-10-CM | POA: Diagnosis not present

## 2021-09-11 NOTE — Telephone Encounter (Signed)
No call, no show. Unable to leave message.  11:23 AM, 09/11/21 M. Sherlyn Lees, PT, DPT Physical Therapist- [place] Office Number: [phone]

## 2021-09-12 DIAGNOSIS — N186 End stage renal disease: Secondary | ICD-10-CM | POA: Diagnosis not present

## 2021-09-12 DIAGNOSIS — Z992 Dependence on renal dialysis: Secondary | ICD-10-CM | POA: Diagnosis not present

## 2021-09-13 ENCOUNTER — Telehealth: Payer: Self-pay | Admitting: Family Medicine

## 2021-09-13 DIAGNOSIS — N186 End stage renal disease: Secondary | ICD-10-CM | POA: Diagnosis not present

## 2021-09-13 DIAGNOSIS — Z992 Dependence on renal dialysis: Secondary | ICD-10-CM | POA: Diagnosis not present

## 2021-09-13 NOTE — Telephone Encounter (Signed)
I attempted to leave message for patient to call back and schedule Medicare Annual Wellness Visit (AWV) in office. No voice mail.  If not able to come in office, please offer to do virtually or by telephone.  Left office number and my jabber [phone].  Last AWV:05/01/2019  Please schedule at anytime with Nurse Health Advisor.

## 2021-09-14 DIAGNOSIS — N186 End stage renal disease: Secondary | ICD-10-CM | POA: Diagnosis not present

## 2021-09-14 DIAGNOSIS — Z992 Dependence on renal dialysis: Secondary | ICD-10-CM | POA: Diagnosis not present

## 2021-09-15 DIAGNOSIS — N186 End stage renal disease: Secondary | ICD-10-CM | POA: Diagnosis not present

## 2021-09-15 DIAGNOSIS — Z992 Dependence on renal dialysis: Secondary | ICD-10-CM | POA: Diagnosis not present

## 2021-09-16 DIAGNOSIS — N186 End stage renal disease: Secondary | ICD-10-CM | POA: Diagnosis not present

## 2021-09-16 DIAGNOSIS — Z992 Dependence on renal dialysis: Secondary | ICD-10-CM | POA: Diagnosis not present

## 2021-09-17 DIAGNOSIS — Z992 Dependence on renal dialysis: Secondary | ICD-10-CM | POA: Diagnosis not present

## 2021-09-17 DIAGNOSIS — N186 End stage renal disease: Secondary | ICD-10-CM | POA: Diagnosis not present

## 2021-09-18 DIAGNOSIS — N186 End stage renal disease: Secondary | ICD-10-CM | POA: Diagnosis not present

## 2021-09-18 DIAGNOSIS — Z992 Dependence on renal dialysis: Secondary | ICD-10-CM | POA: Diagnosis not present

## 2021-09-19 DIAGNOSIS — Z992 Dependence on renal dialysis: Secondary | ICD-10-CM | POA: Diagnosis not present

## 2021-09-19 DIAGNOSIS — N186 End stage renal disease: Secondary | ICD-10-CM | POA: Diagnosis not present

## 2021-09-20 DIAGNOSIS — Z992 Dependence on renal dialysis: Secondary | ICD-10-CM | POA: Diagnosis not present

## 2021-09-20 DIAGNOSIS — N186 End stage renal disease: Secondary | ICD-10-CM | POA: Diagnosis not present

## 2021-09-21 DIAGNOSIS — N186 End stage renal disease: Secondary | ICD-10-CM | POA: Diagnosis not present

## 2021-09-21 DIAGNOSIS — Z992 Dependence on renal dialysis: Secondary | ICD-10-CM | POA: Diagnosis not present

## 2021-09-22 DIAGNOSIS — N186 End stage renal disease: Secondary | ICD-10-CM | POA: Diagnosis not present

## 2021-09-22 DIAGNOSIS — Z992 Dependence on renal dialysis: Secondary | ICD-10-CM | POA: Diagnosis not present

## 2021-09-23 DIAGNOSIS — Z992 Dependence on renal dialysis: Secondary | ICD-10-CM | POA: Diagnosis not present

## 2021-09-23 DIAGNOSIS — N186 End stage renal disease: Secondary | ICD-10-CM | POA: Diagnosis not present

## 2021-09-24 DIAGNOSIS — Z992 Dependence on renal dialysis: Secondary | ICD-10-CM | POA: Diagnosis not present

## 2021-09-24 DIAGNOSIS — N186 End stage renal disease: Secondary | ICD-10-CM | POA: Diagnosis not present

## 2021-09-25 DIAGNOSIS — Z992 Dependence on renal dialysis: Secondary | ICD-10-CM | POA: Diagnosis not present

## 2021-09-25 DIAGNOSIS — N186 End stage renal disease: Secondary | ICD-10-CM | POA: Diagnosis not present

## 2021-09-26 DIAGNOSIS — Z992 Dependence on renal dialysis: Secondary | ICD-10-CM | POA: Diagnosis not present

## 2021-09-26 DIAGNOSIS — N186 End stage renal disease: Secondary | ICD-10-CM | POA: Diagnosis not present

## 2021-09-27 DIAGNOSIS — N186 End stage renal disease: Secondary | ICD-10-CM | POA: Diagnosis not present

## 2021-09-27 DIAGNOSIS — Z992 Dependence on renal dialysis: Secondary | ICD-10-CM | POA: Diagnosis not present

## 2021-09-28 DIAGNOSIS — N186 End stage renal disease: Secondary | ICD-10-CM | POA: Diagnosis not present

## 2021-09-28 DIAGNOSIS — Z992 Dependence on renal dialysis: Secondary | ICD-10-CM | POA: Diagnosis not present

## 2021-09-29 ENCOUNTER — Other Ambulatory Visit: Payer: Self-pay

## 2021-09-29 ENCOUNTER — Other Ambulatory Visit: Payer: Medicare Other

## 2021-09-29 DIAGNOSIS — Z992 Dependence on renal dialysis: Secondary | ICD-10-CM | POA: Diagnosis not present

## 2021-09-29 DIAGNOSIS — N186 End stage renal disease: Secondary | ICD-10-CM | POA: Diagnosis not present

## 2021-09-29 DIAGNOSIS — E039 Hypothyroidism, unspecified: Secondary | ICD-10-CM

## 2021-09-30 ENCOUNTER — Other Ambulatory Visit: Payer: Self-pay | Admitting: Family Medicine

## 2021-09-30 DIAGNOSIS — Z992 Dependence on renal dialysis: Secondary | ICD-10-CM | POA: Diagnosis not present

## 2021-09-30 DIAGNOSIS — N186 End stage renal disease: Secondary | ICD-10-CM | POA: Diagnosis not present

## 2021-09-30 LAB — TSH: TSH: 3.07 mIU/L (ref 0.40–4.50)

## 2021-10-01 DIAGNOSIS — Z992 Dependence on renal dialysis: Secondary | ICD-10-CM | POA: Diagnosis not present

## 2021-10-01 DIAGNOSIS — N186 End stage renal disease: Secondary | ICD-10-CM | POA: Diagnosis not present

## 2021-10-02 DIAGNOSIS — N186 End stage renal disease: Secondary | ICD-10-CM | POA: Diagnosis not present

## 2021-10-02 DIAGNOSIS — Z992 Dependence on renal dialysis: Secondary | ICD-10-CM | POA: Diagnosis not present

## 2021-10-03 DIAGNOSIS — Z992 Dependence on renal dialysis: Secondary | ICD-10-CM | POA: Diagnosis not present

## 2021-10-03 DIAGNOSIS — N186 End stage renal disease: Secondary | ICD-10-CM | POA: Diagnosis not present

## 2021-10-04 DIAGNOSIS — N186 End stage renal disease: Secondary | ICD-10-CM | POA: Diagnosis not present

## 2021-10-04 DIAGNOSIS — Z992 Dependence on renal dialysis: Secondary | ICD-10-CM | POA: Diagnosis not present

## 2021-10-05 DIAGNOSIS — D509 Iron deficiency anemia, unspecified: Secondary | ICD-10-CM | POA: Diagnosis not present

## 2021-10-05 DIAGNOSIS — N186 End stage renal disease: Secondary | ICD-10-CM | POA: Diagnosis not present

## 2021-10-05 DIAGNOSIS — Z992 Dependence on renal dialysis: Secondary | ICD-10-CM | POA: Diagnosis not present

## 2021-10-06 DIAGNOSIS — Z992 Dependence on renal dialysis: Secondary | ICD-10-CM | POA: Diagnosis not present

## 2021-10-06 DIAGNOSIS — N186 End stage renal disease: Secondary | ICD-10-CM | POA: Diagnosis not present

## 2021-10-07 DIAGNOSIS — N186 End stage renal disease: Secondary | ICD-10-CM | POA: Diagnosis not present

## 2021-10-07 DIAGNOSIS — Z992 Dependence on renal dialysis: Secondary | ICD-10-CM | POA: Diagnosis not present

## 2021-10-08 DIAGNOSIS — Z992 Dependence on renal dialysis: Secondary | ICD-10-CM | POA: Diagnosis not present

## 2021-10-08 DIAGNOSIS — N186 End stage renal disease: Secondary | ICD-10-CM | POA: Diagnosis not present

## 2021-10-09 DIAGNOSIS — N186 End stage renal disease: Secondary | ICD-10-CM | POA: Diagnosis not present

## 2021-10-09 DIAGNOSIS — Z992 Dependence on renal dialysis: Secondary | ICD-10-CM | POA: Diagnosis not present

## 2021-10-10 DIAGNOSIS — Z992 Dependence on renal dialysis: Secondary | ICD-10-CM | POA: Diagnosis not present

## 2021-10-10 DIAGNOSIS — N186 End stage renal disease: Secondary | ICD-10-CM | POA: Diagnosis not present

## 2021-10-11 DIAGNOSIS — Z992 Dependence on renal dialysis: Secondary | ICD-10-CM | POA: Diagnosis not present

## 2021-10-11 DIAGNOSIS — N186 End stage renal disease: Secondary | ICD-10-CM | POA: Diagnosis not present

## 2021-10-12 DIAGNOSIS — N186 End stage renal disease: Secondary | ICD-10-CM | POA: Diagnosis not present

## 2021-10-12 DIAGNOSIS — Z992 Dependence on renal dialysis: Secondary | ICD-10-CM | POA: Diagnosis not present

## 2021-10-13 ENCOUNTER — Encounter (HOSPITAL_COMMUNITY): Payer: Self-pay

## 2021-10-13 DIAGNOSIS — Z992 Dependence on renal dialysis: Secondary | ICD-10-CM | POA: Diagnosis not present

## 2021-10-13 DIAGNOSIS — N186 End stage renal disease: Secondary | ICD-10-CM | POA: Diagnosis not present

## 2021-10-13 NOTE — Therapy (Signed)
[place], [name], [address] Phone: [phone]   Fax:  [phone]  Patient Details  Name: Bradley Black MRN: [id number] Date of Birth: [birth year] Referring Provider:  No ref. provider found  Encounter Date: 10/13/2021  PHYSICAL THERAPY DISCHARGE SUMMARY  Visits from Start of Care: 3  Current functional level related to goals / functional outcomes: Unable to determine   Remaining deficits: Postural deficits   Education / Equipment: HEP initiated   Patient agrees to discharge. Patient goals were not met. Patient is being discharged due to not returning since the last visit.   Toniann Fail, PT 10/13/2021, 9:11 AM  [place], [name], [address] Phone: [phone]   Fax:  [phone]

## 2021-10-14 DIAGNOSIS — Z992 Dependence on renal dialysis: Secondary | ICD-10-CM | POA: Diagnosis not present

## 2021-10-14 DIAGNOSIS — N186 End stage renal disease: Secondary | ICD-10-CM | POA: Diagnosis not present

## 2021-10-15 DIAGNOSIS — N186 End stage renal disease: Secondary | ICD-10-CM | POA: Diagnosis not present

## 2021-10-15 DIAGNOSIS — Z992 Dependence on renal dialysis: Secondary | ICD-10-CM | POA: Diagnosis not present

## 2021-10-16 DIAGNOSIS — Z992 Dependence on renal dialysis: Secondary | ICD-10-CM | POA: Diagnosis not present

## 2021-10-16 DIAGNOSIS — N186 End stage renal disease: Secondary | ICD-10-CM | POA: Diagnosis not present

## 2021-10-17 DIAGNOSIS — N186 End stage renal disease: Secondary | ICD-10-CM | POA: Diagnosis not present

## 2021-10-17 DIAGNOSIS — Z992 Dependence on renal dialysis: Secondary | ICD-10-CM | POA: Diagnosis not present

## 2021-10-18 DIAGNOSIS — N186 End stage renal disease: Secondary | ICD-10-CM | POA: Diagnosis not present

## 2021-10-18 DIAGNOSIS — Z992 Dependence on renal dialysis: Secondary | ICD-10-CM | POA: Diagnosis not present

## 2021-10-19 DIAGNOSIS — N186 End stage renal disease: Secondary | ICD-10-CM | POA: Diagnosis not present

## 2021-10-19 DIAGNOSIS — Z992 Dependence on renal dialysis: Secondary | ICD-10-CM | POA: Diagnosis not present

## 2021-10-20 DIAGNOSIS — Z992 Dependence on renal dialysis: Secondary | ICD-10-CM | POA: Diagnosis not present

## 2021-10-20 DIAGNOSIS — N186 End stage renal disease: Secondary | ICD-10-CM | POA: Diagnosis not present

## 2021-10-21 DIAGNOSIS — N186 End stage renal disease: Secondary | ICD-10-CM | POA: Diagnosis not present

## 2021-10-21 DIAGNOSIS — Z992 Dependence on renal dialysis: Secondary | ICD-10-CM | POA: Diagnosis not present

## 2021-10-22 DIAGNOSIS — N186 End stage renal disease: Secondary | ICD-10-CM | POA: Diagnosis not present

## 2021-10-22 DIAGNOSIS — Z992 Dependence on renal dialysis: Secondary | ICD-10-CM | POA: Diagnosis not present

## 2021-10-23 ENCOUNTER — Other Ambulatory Visit: Payer: Self-pay

## 2021-10-23 ENCOUNTER — Other Ambulatory Visit: Payer: Self-pay | Admitting: Family Medicine

## 2021-10-23 ENCOUNTER — Telehealth: Payer: Self-pay | Admitting: Cardiology

## 2021-10-23 DIAGNOSIS — N186 End stage renal disease: Secondary | ICD-10-CM | POA: Diagnosis not present

## 2021-10-23 DIAGNOSIS — Z992 Dependence on renal dialysis: Secondary | ICD-10-CM | POA: Diagnosis not present

## 2021-10-23 MED ORDER — LEVOTHYROXINE SODIUM 50 MCG PO TABS: 50.0000 ug | ORAL_TABLET | Freq: Every day | ORAL | 3 refills | Status: DC

## 2021-10-23 NOTE — Telephone Encounter (Signed)
Pt called to ask for refill on Levothyroxine 9mcg. Pt states he previously got this from another provider but they have now told him to contact his PCP for refills. Recent TSH labs in chart. Last refill 08/09/21, #30, 1 refill.  Please advise, thanks!

## 2021-10-23 NOTE — Telephone Encounter (Signed)
Thyroid medication needs to go through PCP.Unable to leave message on pt's phone.

## 2021-10-23 NOTE — Telephone Encounter (Signed)
°*  STAT* If patient is at the pharmacy, call can be transferred to refill team.   1. Which medications need to be refilled? (please list name of each medication and dose if known)    levothyroxine (SYNTHROID) 50 MCG tablet   2. Which pharmacy/location (including street and city if local pharmacy) is medication to be sent to?[place], [place]  Phone:  [phone] Fax:  [phone]   3. Do they need a 30 day or 90 day supply? 30 ds

## 2021-10-24 DIAGNOSIS — Z992 Dependence on renal dialysis: Secondary | ICD-10-CM | POA: Diagnosis not present

## 2021-10-24 DIAGNOSIS — N186 End stage renal disease: Secondary | ICD-10-CM | POA: Diagnosis not present

## 2021-10-24 NOTE — Telephone Encounter (Signed)
Medication list shows levothyroxine was refilled by pcp, Dr.Pickard yesterday.

## 2021-10-24 NOTE — Telephone Encounter (Signed)
Phone message states call cannot be completed as dialed.

## 2021-10-25 DIAGNOSIS — N186 End stage renal disease: Secondary | ICD-10-CM | POA: Diagnosis not present

## 2021-10-25 DIAGNOSIS — Z992 Dependence on renal dialysis: Secondary | ICD-10-CM | POA: Diagnosis not present

## 2021-10-26 DIAGNOSIS — Z992 Dependence on renal dialysis: Secondary | ICD-10-CM | POA: Diagnosis not present

## 2021-10-26 DIAGNOSIS — N186 End stage renal disease: Secondary | ICD-10-CM | POA: Diagnosis not present

## 2021-10-27 DIAGNOSIS — Z992 Dependence on renal dialysis: Secondary | ICD-10-CM | POA: Diagnosis not present

## 2021-10-27 DIAGNOSIS — N186 End stage renal disease: Secondary | ICD-10-CM | POA: Diagnosis not present

## 2021-10-28 DIAGNOSIS — N186 End stage renal disease: Secondary | ICD-10-CM | POA: Diagnosis not present

## 2021-10-28 DIAGNOSIS — Z992 Dependence on renal dialysis: Secondary | ICD-10-CM | POA: Diagnosis not present

## 2021-10-29 DIAGNOSIS — Z992 Dependence on renal dialysis: Secondary | ICD-10-CM | POA: Diagnosis not present

## 2021-10-29 DIAGNOSIS — N186 End stage renal disease: Secondary | ICD-10-CM | POA: Diagnosis not present

## 2021-10-30 DIAGNOSIS — Z992 Dependence on renal dialysis: Secondary | ICD-10-CM | POA: Diagnosis not present

## 2021-10-30 DIAGNOSIS — N186 End stage renal disease: Secondary | ICD-10-CM | POA: Diagnosis not present

## 2021-10-31 ENCOUNTER — Other Ambulatory Visit: Payer: Self-pay | Admitting: Family Medicine

## 2021-10-31 DIAGNOSIS — Z992 Dependence on renal dialysis: Secondary | ICD-10-CM | POA: Diagnosis not present

## 2021-10-31 DIAGNOSIS — N186 End stage renal disease: Secondary | ICD-10-CM | POA: Diagnosis not present

## 2021-11-01 DIAGNOSIS — N186 End stage renal disease: Secondary | ICD-10-CM | POA: Diagnosis not present

## 2021-11-01 DIAGNOSIS — Z992 Dependence on renal dialysis: Secondary | ICD-10-CM | POA: Diagnosis not present

## 2021-11-02 DIAGNOSIS — Z992 Dependence on renal dialysis: Secondary | ICD-10-CM | POA: Diagnosis not present

## 2021-11-02 DIAGNOSIS — N186 End stage renal disease: Secondary | ICD-10-CM | POA: Diagnosis not present

## 2021-11-03 DIAGNOSIS — N186 End stage renal disease: Secondary | ICD-10-CM | POA: Diagnosis not present

## 2021-11-03 DIAGNOSIS — E119 Type 2 diabetes mellitus without complications: Secondary | ICD-10-CM | POA: Diagnosis not present

## 2021-11-03 DIAGNOSIS — Z992 Dependence on renal dialysis: Secondary | ICD-10-CM | POA: Diagnosis not present

## 2021-11-04 DIAGNOSIS — Z992 Dependence on renal dialysis: Secondary | ICD-10-CM | POA: Diagnosis not present

## 2021-11-04 DIAGNOSIS — N186 End stage renal disease: Secondary | ICD-10-CM | POA: Diagnosis not present

## 2021-11-05 DIAGNOSIS — N186 End stage renal disease: Secondary | ICD-10-CM | POA: Diagnosis not present

## 2021-11-05 DIAGNOSIS — Z992 Dependence on renal dialysis: Secondary | ICD-10-CM | POA: Diagnosis not present

## 2021-11-06 DIAGNOSIS — Z992 Dependence on renal dialysis: Secondary | ICD-10-CM | POA: Diagnosis not present

## 2021-11-06 DIAGNOSIS — N186 End stage renal disease: Secondary | ICD-10-CM | POA: Diagnosis not present

## 2021-11-07 DIAGNOSIS — Z992 Dependence on renal dialysis: Secondary | ICD-10-CM | POA: Diagnosis not present

## 2021-11-07 DIAGNOSIS — N186 End stage renal disease: Secondary | ICD-10-CM | POA: Diagnosis not present

## 2021-11-08 DIAGNOSIS — N186 End stage renal disease: Secondary | ICD-10-CM | POA: Diagnosis not present

## 2021-11-08 DIAGNOSIS — Z992 Dependence on renal dialysis: Secondary | ICD-10-CM | POA: Diagnosis not present

## 2021-11-09 DIAGNOSIS — Z992 Dependence on renal dialysis: Secondary | ICD-10-CM | POA: Diagnosis not present

## 2021-11-09 DIAGNOSIS — N186 End stage renal disease: Secondary | ICD-10-CM | POA: Diagnosis not present

## 2021-11-10 DIAGNOSIS — Z992 Dependence on renal dialysis: Secondary | ICD-10-CM | POA: Diagnosis not present

## 2021-11-10 DIAGNOSIS — N186 End stage renal disease: Secondary | ICD-10-CM | POA: Diagnosis not present

## 2021-11-11 DIAGNOSIS — N186 End stage renal disease: Secondary | ICD-10-CM | POA: Diagnosis not present

## 2021-11-11 DIAGNOSIS — Z992 Dependence on renal dialysis: Secondary | ICD-10-CM | POA: Diagnosis not present

## 2021-11-12 DIAGNOSIS — N186 End stage renal disease: Secondary | ICD-10-CM | POA: Diagnosis not present

## 2021-11-12 DIAGNOSIS — Z992 Dependence on renal dialysis: Secondary | ICD-10-CM | POA: Diagnosis not present

## 2021-11-13 DIAGNOSIS — Z992 Dependence on renal dialysis: Secondary | ICD-10-CM | POA: Diagnosis not present

## 2021-11-13 DIAGNOSIS — N186 End stage renal disease: Secondary | ICD-10-CM | POA: Diagnosis not present

## 2021-11-13 DIAGNOSIS — H40013 Open angle with borderline findings, low risk, bilateral: Secondary | ICD-10-CM | POA: Diagnosis not present

## 2021-11-13 LAB — HM DIABETES EYE EXAM

## 2021-11-13 NOTE — Progress Notes (Signed)
[facility] Note  11/14/2021     CHIEF COMPLAINT Patient presents for Retina Evaluation   HISTORY OF PRESENT ILLNESS: Bradley Black is a 76 y.o. male who presents to the clinic today for:   HPI     Retina Evaluation   In both eyes.  This started 1 day ago.  Duration of 1 day.  Context:  distance vision, mid-range vision and near vision.  I, the attending physician,  performed the HPI with the patient and updated documentation appropriately.        Comments   Pt here for ret eval, cataract sx clearance referred by Dr. Rosana Hoes. Pt states he was meant to have cataract sx previously but never went to have it done. He has been diabetic for 30 years but is not currently on any medications for it. He used to see Dr. Zigmund Daniel for ret telangectasia (cyst OD, no cyst OS). Pts note reports diabetic retinopathy OU w/o edema and hypertensive retinopathy OU. He has noticed a decrease in both NVA and DVA over time. No reports of floaters or FOL. 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 Pt reports he had rx specs but lost them. Just wearing OTC readers. Pt is being monitored for elevated IOP by Dr. Rosana Hoes' office.       Last edited by Bernarda Caffey, MD on 11/14/2021  8:48 AM.    Pt is here on the referral of Dr. Rosana Hoes for concern of macular cyst OU, pt used to see Dr. Zigmund Daniel for mac tel 2, but has not seen him since 2018, Dr. Rosana Hoes is going to refer pt to [facility] in [place] for cataract sx  Referring physician: Leticia Clas, [place] [place],  [name] [address]  HISTORICAL INFORMATION:   Selected notes from the MEDICAL RECORD NUMBER Referred by Dr. Leticia Clas for macular cyst LEE:  Ocular Hx- PMH-    CURRENT MEDICATIONS: No current outpatient medications on file. (Ophthalmic Drugs)   No current facility-administered medications for this visit. (Ophthalmic Drugs)   Current Outpatient Medications (Other)  Medication Sig    acetaminophen (TYLENOL) 500 MG tablet Take 1,000 mg by mouth every 6 (six) hours as needed for moderate pain or headache.   ALPRAZolam (XANAX) 0.5 MG tablet Take 1 tablet (0.5 mg total) by mouth at bedtime as needed for sleep.   aspirin EC 81 MG tablet Take 81 mg by mouth daily.   Cholecalciferol (VITAMIN D3) 25 MCG (1000 UT) CAPS Take 1 capsule (1,000 Units total) by mouth daily at 6 (six) AM.   desvenlafaxine (PRISTIQ) 25 MG 24 hr tablet TAKE ONE TABLET BY MOUTH DAILY.   diltiazem (CARDIZEM CD) 120 MG 24 hr capsule Take 120 mg by mouth daily.   doxazosin (CARDURA) 8 MG tablet Take 8 mg by mouth daily.   hydrALAZINE (APRESOLINE) 50 MG tablet Take 75 mg by mouth 3 (three) times daily.   levothyroxine (SYNTHROID) 50 MCG tablet Take 1 tablet (50 mcg total) by mouth daily before breakfast.   levothyroxine (SYNTHROID) 50 MCG tablet TAKE 1 TABLET (50 MCG TOTAL) BY MOUTH BEFORE BREAKFAST. AS OF 08-09-21 DOSE INCREASE   multivitamin (RENA-VIT) TABS tablet Take 1 tablet by mouth daily.   pantoprazole (PROTONIX) 40 MG tablet TAKE ONE TABLET BY MOUTH TWICE A DAY   pravastatin (PRAVACHOL) 20 MG tablet Take 1 tablet (20 mg total) by mouth daily.   sacubitril-valsartan (ENTRESTO) 97-103 MG Take 1 tablet by  mouth 2 (two) times daily.   torsemide (DEMADEX) 20 MG tablet Take 20 mg by mouth daily as needed.   Dulaglutide (TRULICITY) 6.38 LH/7.3SK SOPN Inject 0.75 mg into the skin once a week. On Thursdays. (Patient not taking: Reported on 08/03/2021)   No current facility-administered medications for this visit. (Other)   REVIEW OF SYSTEMS: ROS   Positive for: Genitourinary, Endocrine, Cardiovascular, Eyes Negative for: Constitutional, Gastrointestinal, Neurological, Skin, Musculoskeletal, HENT, Respiratory, Psychiatric, Allergic/Imm, Heme/Lymph Last edited by Kingsley Spittle, COT on 11/14/2021  8:21 AM.     ALLERGIES Allergies  Allergen Reactions   No Known Allergies    PAST MEDICAL HISTORY Past  Medical History:  Diagnosis Date   BPH (benign prostatic hyperplasia)    CAD S/P percutaneous coronary angioplasty    a. PTCA of [place] b. PCI with BMS to LAD in 1997 c. RCA PCI [place] d. s/p CABG in 11/2011 with LIMA-LAD, SVG-PDA, SVG-OM2, and SVG-D1   Cataract    Chronic back pain    CKD (chronic kidney disease), stage III (HCC)    Cyst of bursa    R shoulder   Diabetic retinopathy ([place])    DM (diabetes mellitus), type 2 with renal complications (HCC)    Essential hypertension    Frequent PVCs    GERD (gastroesophageal reflux disease)    Hypertensive retinopathy    Ischemic cardiomyopathy 11/2011   Intra-OP TEE: EF 40-45%, no regional WMA; improved Anterior WM post CABG.   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   LIMA to LAD, SVG to D1, SVG to OM2, SVG to PDA, EVH via right thigh and leg   Past Surgical History:  Procedure Laterality Date   AV FISTULA PLACEMENT Left 07/28/2020   Procedure: LEFT ARM ARTERIOVENOUS (AV) FISTULA  CREATION;  Surgeon: Rosetta Posner, MD;  Location: [facility];  Service: Vascular;  Laterality: Left;   [place]  2013   CORONARY ANGIOPLASTY  1994   OM   CORONARY ANGIOPLASTY  1997   LAD   CORONARY ANGIOPLASTY WITH STENT PLACEMENT  1999   RCA   CORONARY ANGIOPLASTY WITH STENT PLACEMENT  2001   RCA   CORONARY ARTERY BYPASS GRAFT  12/27/2011   Procedure: CORONARY ARTERY BYPASS GRAFTING (CABG);  Surgeon: Rexene Alberts, MD;  Location: [facility];  Service: Open Heart Surgery;  Laterality: N/A;  Times four. On pump. Using endoscopically harvested right greater saphenous vein and left internal mammary artery.    HERNIA REPAIR     INCISION AND DRAINAGE OF WOUND  2006   [place]   INSERTION OF DIALYSIS CATHETER Right 07/25/2020   Procedure: INSERTION OF DIALYSIS CATHETER;  Surgeon: Virl Cagey, MD;  Location: [facility];  Service: General;  Laterality: Right;    INTRAOPERATIVE TRANSESOPHAGEAL ECHOCARDIOGRAM  12/27/2011   Global hypokinesis with EF of 40-45%, improved LAD distribution wall motion.   LEFT HEART CATHETERIZATION WITH CORONARY ANGIOGRAM N/A 12/13/2011   Procedure: LEFT HEART CATHETERIZATION WITH CORONARY ANGIOGRAM;  Surgeon: Leonie Man, MD;  Location: [facility] C[facility] LAB;  Service: Cardiovascular;  Laterality: N/A;   LUMBAR FUSION     MASS EXCISION  10/24/11   R arm   RIGHT HEART CATH N/A 07/12/2020   Procedure: RIGHT HEART CATH;  Surgeon: Belva Crome, MD;  Location: [facility];  Service: Cardiovascular;  Laterality: N/A;  TRANSESOPHAGEAL ECHOCARDIOGRAM  2013    FAMILY HISTORY Family History  Problem Relation Age of Onset   Cancer Mother        breast   Cancer Father        bone   Gustav cancer Neg Hx     SOCIAL HISTORY Social History   Tobacco Use   Smoking status: Former    Types: Cigarettes    Quit date: 10/01/1986    Years since quitting: 35.1   Smokeless tobacco: Never   Tobacco comments:    quit about 30 yrs ago  Vaping Use   Vaping Use: Never used  Substance Use Topics   Alcohol use: No   Drug use: No       OPHTHALMIC EXAM:  Base Eye Exam     Visual Acuity (Snellen - Linear)       Right Left   Dist [place] 20/80 20/60 -2   Dist ph [place] 20/50 20/50         Tonometry (Tonopen, 8:37 AM)       Right Left   Pressure 23 15         Pupils       Dark Light Shape React APD   Right 3 2 Round Brisk None   Left 3 2 Round Brisk None         Visual Fields (Counting fingers)       Left Right    Full Full         Extraocular Movement       Right Left    Full, Ortho Full, Ortho         Neuro/Psych     Oriented x3: Yes   Mood/Affect: Normal         Dilation     Both eyes: 1.0% Mydriacyl, 2.5% Phenylephrine @ 8:39 AM           Slit Lamp and Fundus Exam     Slit Lamp Exam       Right Left   Lids/Lashes Dermatochalasis - upper lid Dermatochalasis - upper lid    Conjunctiva/Sclera nasal and temporal pinguecula temporal pinguecula   Cornea arcus, trace PEE arcus, 1+ PEE, tear film debris   Anterior Chamber deep, clear, narrow temporal angle deep, clear, narrow temporal angle   Iris Round and dilated, No NVI, mild anterior bowing Round and dilated, No NVI, mild anterior bowing   Lens 3+ Nuclear sclerosis with brunescence, 3+ Cortical cataract 3+ Nuclear sclerosis with brunescence, 3+ Cortical cataract   Anterior Vitreous Vitreous syneresis Vitreous syneresis         Fundus Exam       Right Left   Disc Pink and Sharp Pink and Sharp, mild PPA   C/D Ratio 0.2 0.2   Macula Flat, Blunted foveal reflex, central cystic changes, right angle vessels Flat, Blunted foveal reflex, mild RPE mottling, right angle vessels, no heme   Vessels mild attenuation, mild tortuousity, right angle vessels mild attenuation, mild tortuousity, right angle vessels   Periphery Attached, No heme Attached, No heme           Refraction     Manifest Refraction       Sphere Cylinder Axis Dist VA   Right -1.00 +1.25 175 20/60+2   Left -1.00 +0.50 025 20/70            IMAGING AND PROCEDURES  Imaging and Procedures for 11/14/2021  OCT, Retina - OU - Both Eyes  Right Eye Quality was good. Central Foveal Thickness: 289. Progression has worsened. Findings include abnormal foveal contour, intraretinal fluid, no SRF, outer retinal atrophy (Interval increase in central cystic changes and ellipsoid disruption / ORA; interval blunting of foveal contour).   Left Eye Quality was good. Progression has worsened. Findings include abnormal foveal contour, intraretinal fluid, vitreomacular adhesion , no SRF (Interval development of central cystic changes -- ?VMT component).   Notes *Images captured and stored on drive  Diagnosis / Impression:  mac tel 2 OU OD: Interval increase in central cystic changes and ellipsoid disruption / ORA; interval blunting of foveal  contour OS: Interval development of central cystic changes -- ?VMT component  Clinical management:  See below  Abbreviations: NFP - Normal foveal profile. CME - cystoid macular edema. PED - pigment epithelial detachment. IRF - intraretinal fluid. SRF - subretinal fluid. EZ - ellipsoid zone. ERM - epiretinal membrane. ORA - outer retinal atrophy. ORT - outer retinal tubulation. SRHM - subretinal hyper-reflective material. IRHM - intraretinal hyper-reflective material            ASSESSMENT/PLAN:    ICD-10-CM   1. 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 Diabetes mellitus type 2 without retinopathy ([place])  E11.9 OCT, Retina - OU - Both Eyes    3. Essential hypertension  I10     4. Hypertensive retinopathy of both eyes  H35.033     5. Combined forms of age-related cataract of both eyes  H25.813      1. 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 Zigmund Daniel -- last visit 2018  - referred back here for cataract clearance  - BCVA 20/50 OU (down from 20/40 OD and 20/20 OS in Apr 2018) -- mostly from cataract progression  - exam shows right angle vessels and RPE mottling; no refractile deposits  - OCT shows OD: Interval increase in central cystic changes and ellipsoid disruption / ORA; interval blunting of foveal contour; OS: Interval development of central cystic changes -- ?VMT component  - discussed findings, prognosis and experimental treatments and ongoing clinical trials  - discussed association of serine and lipid metabolism with disease  - no intervention indicated at this time, recommend monitoring   - f/u 6 months, sooner prn -- DFE, OCT, FA  2. Diabetes mellitus, type 2 without retinopathy - The incidence, risk factors for progression, natural history and treatment options for diabetic retinopathy  were discussed with patient.   - The need for close monitoring of blood glucose, blood pressure,  and serum lipids, avoiding cigarette or any type of tobacco, and the need for long term follow up was also discussed with patient. - f/u in 1 year, sooner prn  3,4. Hypertensive retinopathy OU - discussed importance of tight BP control - monitor  5. Mixed Cataract OU - The symptoms of cataract, surgical options, and treatments and risks were discussed with patient. - discussed diagnosis and progression - clear from a retina standpoint to proceed with cataract surgery when pt and surgeon are ready  Ophthalmic Meds Ordered this visit:  No orders of the defined types were placed in this encounter.    Return in about 6 months (around 05/14/2022) for f/u mac tel 2, DFE, OCT, Fluorescein Angiogram.  There are no Patient Instructions on file for this visit.   Explained the diagnoses, plan, and follow up with the patient and they expressed  understanding.  Patient expressed understanding of the importance of proper follow up care.   This document serves as a record of services personally performed by Gardiner Sleeper, MD, PhD. It was created on their behalf by San Jetty. Owens Shark, OA an ophthalmic technician. The creation of this record is the provider's dictation and/or activities during the visit.    Electronically signed by: San Jetty. Owens Shark, [place] 02.13.2023 9:34 AM  Gardiner Sleeper, M.D., Ph.D. Diseases & Surgery of the Retina and Vitreous [facility]  I have reviewed the above documentation for accuracy and completeness, and I agree with the above. Gardiner Sleeper, M.D., Ph.D. 11/14/21 9:38 AM  Abbreviations: M myopia (nearsighted); A astigmatism; H hyperopia (farsighted); P presbyopia; Mrx spectacle prescription;  CTL contact lenses; OD right eye; OS left eye; OU both eyes  XT exotropia; ET esotropia; PEK punctate epithelial keratitis; PEE punctate epithelial erosions; DES dry eye syndrome; MGD meibomian gland dysfunction; ATs artificial tears; PFAT's preservative free  artificial tears; [place] nuclear sclerotic cataract; PSC posterior subcapsular cataract; ERM epi-retinal membrane; PVD posterior vitreous detachment; RD retinal detachment; DM diabetes mellitus; DR diabetic retinopathy; NPDR non-proliferative diabetic retinopathy; PDR proliferative diabetic retinopathy; CSME clinically significant macular edema; DME diabetic macular edema; dbh dot blot hemorrhages; CWS cotton wool spot; POAG primary open angle glaucoma; C/D cup-to-disc ratio; HVF humphrey visual field; GVF goldmann visual field; OCT optical coherence tomography; IOP intraocular pressure; BRVO Branch retinal vein occlusion; CRVO central retinal vein occlusion; CRAO central retinal artery occlusion; BRAO branch retinal artery occlusion; RT retinal tear; SB scleral buckle; PPV pars plana vitrectomy; VH Vitreous hemorrhage; PRP panretinal laser photocoagulation; IVK intravitreal kenalog; VMT vitreomacular traction; MH Macular hole;  NVD neovascularization of the disc; NVE neovascularization elsewhere; AREDS age related eye disease study; ARMD age related macular degeneration; POAG primary open angle glaucoma; EBMD epithelial/anterior basement membrane dystrophy; ACIOL anterior chamber intraocular lens; IOL intraocular lens; PCIOL posterior chamber intraocular lens; Phaco/IOL phacoemulsification with intraocular lens placement; [place] photorefractive keratectomy; LASIK laser assisted in situ keratomileusis; HTN hypertension; DM diabetes mellitus; COPD chronic obstructive pulmonary disease

## 2021-11-14 ENCOUNTER — Other Ambulatory Visit: Payer: Self-pay | Admitting: Cardiology

## 2021-11-14 ENCOUNTER — Other Ambulatory Visit: Payer: Self-pay

## 2021-11-14 ENCOUNTER — Encounter (INDEPENDENT_AMBULATORY_CARE_PROVIDER_SITE_OTHER): Payer: Self-pay | Admitting: Ophthalmology

## 2021-11-14 ENCOUNTER — Ambulatory Visit (INDEPENDENT_AMBULATORY_CARE_PROVIDER_SITE_OTHER): Payer: Medicare Other | Admitting: Ophthalmology

## 2021-11-14 DIAGNOSIS — H35073 Retinal telangiectasis, bilateral: Secondary | ICD-10-CM | POA: Diagnosis not present

## 2021-11-14 DIAGNOSIS — I10 Essential (primary) hypertension: Secondary | ICD-10-CM

## 2021-11-14 DIAGNOSIS — H25813 Combined forms of age-related cataract, bilateral: Secondary | ICD-10-CM | POA: Diagnosis not present

## 2021-11-14 DIAGNOSIS — H35033 Hypertensive retinopathy, bilateral: Secondary | ICD-10-CM

## 2021-11-14 DIAGNOSIS — Z992 Dependence on renal dialysis: Secondary | ICD-10-CM | POA: Diagnosis not present

## 2021-11-14 DIAGNOSIS — H3581 Retinal edema: Secondary | ICD-10-CM

## 2021-11-14 DIAGNOSIS — N186 End stage renal disease: Secondary | ICD-10-CM | POA: Diagnosis not present

## 2021-11-14 DIAGNOSIS — E119 Type 2 diabetes mellitus without complications: Secondary | ICD-10-CM

## 2021-11-15 DIAGNOSIS — N186 End stage renal disease: Secondary | ICD-10-CM | POA: Diagnosis not present

## 2021-11-15 DIAGNOSIS — Z992 Dependence on renal dialysis: Secondary | ICD-10-CM | POA: Diagnosis not present

## 2021-11-16 DIAGNOSIS — Z992 Dependence on renal dialysis: Secondary | ICD-10-CM | POA: Diagnosis not present

## 2021-11-16 DIAGNOSIS — N186 End stage renal disease: Secondary | ICD-10-CM | POA: Diagnosis not present

## 2021-11-17 DIAGNOSIS — N186 End stage renal disease: Secondary | ICD-10-CM | POA: Diagnosis not present

## 2021-11-17 DIAGNOSIS — Z992 Dependence on renal dialysis: Secondary | ICD-10-CM | POA: Diagnosis not present

## 2021-11-18 DIAGNOSIS — N186 End stage renal disease: Secondary | ICD-10-CM | POA: Diagnosis not present

## 2021-11-18 DIAGNOSIS — Z992 Dependence on renal dialysis: Secondary | ICD-10-CM | POA: Diagnosis not present

## 2021-11-19 DIAGNOSIS — Z992 Dependence on renal dialysis: Secondary | ICD-10-CM | POA: Diagnosis not present

## 2021-11-19 DIAGNOSIS — N186 End stage renal disease: Secondary | ICD-10-CM | POA: Diagnosis not present

## 2021-11-20 DIAGNOSIS — Z992 Dependence on renal dialysis: Secondary | ICD-10-CM | POA: Diagnosis not present

## 2021-11-20 DIAGNOSIS — N186 End stage renal disease: Secondary | ICD-10-CM | POA: Diagnosis not present

## 2021-11-21 DIAGNOSIS — Z992 Dependence on renal dialysis: Secondary | ICD-10-CM | POA: Diagnosis not present

## 2021-11-21 DIAGNOSIS — N186 End stage renal disease: Secondary | ICD-10-CM | POA: Diagnosis not present

## 2021-11-22 DIAGNOSIS — N186 End stage renal disease: Secondary | ICD-10-CM | POA: Diagnosis not present

## 2021-11-22 DIAGNOSIS — Z992 Dependence on renal dialysis: Secondary | ICD-10-CM | POA: Diagnosis not present

## 2021-11-23 ENCOUNTER — Telehealth: Payer: Self-pay | Admitting: Family Medicine

## 2021-11-23 DIAGNOSIS — Z992 Dependence on renal dialysis: Secondary | ICD-10-CM | POA: Diagnosis not present

## 2021-11-23 DIAGNOSIS — N186 End stage renal disease: Secondary | ICD-10-CM | POA: Diagnosis not present

## 2021-11-23 NOTE — Telephone Encounter (Signed)
Left message for patient to call back and schedule Medicare Annual Wellness Visit (AWV) in office.   If not able to come in office, please offer to do virtually or by telephone.  Left office number and my jabber [phone].  Last AWV:05/01/2019  Please schedule at anytime with Nurse Health Advisor.

## 2021-11-24 DIAGNOSIS — Z992 Dependence on renal dialysis: Secondary | ICD-10-CM | POA: Diagnosis not present

## 2021-11-24 DIAGNOSIS — N186 End stage renal disease: Secondary | ICD-10-CM | POA: Diagnosis not present

## 2021-11-25 DIAGNOSIS — N186 End stage renal disease: Secondary | ICD-10-CM | POA: Diagnosis not present

## 2021-11-25 DIAGNOSIS — Z992 Dependence on renal dialysis: Secondary | ICD-10-CM | POA: Diagnosis not present

## 2021-11-26 DIAGNOSIS — Z992 Dependence on renal dialysis: Secondary | ICD-10-CM | POA: Diagnosis not present

## 2021-11-26 DIAGNOSIS — N186 End stage renal disease: Secondary | ICD-10-CM | POA: Diagnosis not present

## 2021-11-27 DIAGNOSIS — N186 End stage renal disease: Secondary | ICD-10-CM | POA: Diagnosis not present

## 2021-11-27 DIAGNOSIS — Z992 Dependence on renal dialysis: Secondary | ICD-10-CM | POA: Diagnosis not present

## 2021-11-28 DIAGNOSIS — N186 End stage renal disease: Secondary | ICD-10-CM | POA: Diagnosis not present

## 2021-11-28 DIAGNOSIS — Z992 Dependence on renal dialysis: Secondary | ICD-10-CM | POA: Diagnosis not present

## 2021-11-29 DIAGNOSIS — N186 End stage renal disease: Secondary | ICD-10-CM | POA: Diagnosis not present

## 2021-11-29 DIAGNOSIS — Z992 Dependence on renal dialysis: Secondary | ICD-10-CM | POA: Diagnosis not present

## 2021-11-30 ENCOUNTER — Telehealth: Payer: Self-pay

## 2021-11-30 ENCOUNTER — Other Ambulatory Visit: Payer: Self-pay | Admitting: *Deleted

## 2021-11-30 ENCOUNTER — Other Ambulatory Visit: Payer: Self-pay | Admitting: Family Medicine

## 2021-11-30 ENCOUNTER — Ambulatory Visit (INDEPENDENT_AMBULATORY_CARE_PROVIDER_SITE_OTHER): Payer: Medicare Other

## 2021-11-30 ENCOUNTER — Other Ambulatory Visit: Payer: Self-pay

## 2021-11-30 VITALS — BP 142/76 | HR 62 | Ht 68.0 in | Wt 139.0 lb

## 2021-11-30 DIAGNOSIS — Z992 Dependence on renal dialysis: Secondary | ICD-10-CM | POA: Diagnosis not present

## 2021-11-30 DIAGNOSIS — N186 End stage renal disease: Secondary | ICD-10-CM | POA: Diagnosis not present

## 2021-11-30 DIAGNOSIS — Z0000 Encounter for general adult medical examination without abnormal findings: Secondary | ICD-10-CM

## 2021-11-30 DIAGNOSIS — M5134 Other intervertebral disc degeneration, thoracic region: Secondary | ICD-10-CM

## 2021-11-30 MED ORDER — DILTIAZEM HCL ER COATED BEADS 120 MG PO CP24: 120.0000 mg | ORAL_CAPSULE | Freq: Every day | ORAL | 1 refills | Status: DC

## 2021-11-30 NOTE — Patient Instructions (Signed)
Bradley Black , Thank you for taking time to come for your Medicare Wellness Visit. I appreciate your ongoing commitment to your health goals. Please review the following plan we discussed and let me know if I can assist you in the future.   Screening recommendations/referrals: Colonoscopy: Cologuard, complete at your convenience.  Recommended yearly ophthalmology/optometry visit for glaucoma screening and checkup Recommended yearly dental visit for hygiene and checkup  Vaccinations: Influenza vaccine: Done 07/06/2021 Repeat annually  Pneumococcal vaccine: Done 02/12/2013 and 04/19/2015 Tdap vaccine: Done 10/01/2001 Shingles vaccine: Done 03/04/2007    Covid-19: Done 11/28/2019 and 11/07/2019  Advanced directives: Advance directive discussed with you today. Even though you declined this today, please call our office should you change your mind, and we can give you the proper paperwork for you to fill out.   Conditions/risks identified: KEEP UP THE GOOD WORK!!  Next appointment: Follow up in one year for your annual wellness visit. 2024.  Preventive Care 28 Years and Older, Male  Preventive care refers to lifestyle choices and visits with your health care provider that can promote health and wellness. What does preventive care include? A yearly physical exam. This is also called an annual well check. Dental exams once or twice a year. Routine eye exams. Ask your health care provider how often you should have your eyes checked. Personal lifestyle choices, including: Daily care of your teeth and gums. Regular physical activity. Eating a healthy diet. Avoiding tobacco and drug use. Limiting alcohol use. Practicing safe sex. Taking low doses of aspirin every day. Taking vitamin and mineral supplements as recommended by your health care provider. What happens during an annual well check? The services and screenings done by your health care provider during your annual well check will depend on  your age, overall health, lifestyle risk factors, and family history of disease. Counseling  Your health care provider may ask you questions about your: Alcohol use. Tobacco use. Drug use. Emotional well-being. Home and relationship well-being. Sexual activity. Eating habits. History of falls. Memory and ability to understand (cognition). Work and work Statistician. Screening  You may have the following tests or measurements: Height, weight, and BMI. Blood pressure. Lipid and cholesterol levels. These may be checked every 5 years, or more frequently if you are over 56 years old. Skin check. Lung cancer screening. You may have this screening every year starting at age 32 if you have a 30-pack-year history of smoking and currently smoke or have quit within the past 15 years. Fecal occult blood test (FOBT) of the stool. You may have this test every year starting at age 63. Flexible sigmoidoscopy or colonoscopy. You may have a sigmoidoscopy every 5 years or a colonoscopy every 10 years starting at age 44. Prostate cancer screening. Recommendations will vary depending on your family history and other risks. Hepatitis C blood test. Hepatitis B blood test. Sexually transmitted disease (STD) testing. Diabetes screening. This is done by checking your blood sugar (glucose) after you have not eaten for a while (fasting). You may have this done every 1-3 years. Abdominal aortic aneurysm (AAA) screening. You may need this if you are a current or former smoker. Osteoporosis. You may be screened starting at age 82 if you are at high risk. Talk with your health care provider about your test results, treatment options, and if necessary, the need for more tests. Vaccines  Your health care provider may recommend certain vaccines, such as: Influenza vaccine. This is recommended every year. Tetanus, diphtheria, and acellular  pertussis (Tdap, Td) vaccine. You may need a Td booster every 10 years. Zoster  vaccine. You may need this after age 32. Pneumococcal 13-valent conjugate (PCV13) vaccine. One dose is recommended after age 49. Pneumococcal polysaccharide (PPSV23) vaccine. One dose is recommended after age 76. Talk to your health care provider about which screenings and vaccines you need and how often you need them. This information is not intended to replace advice given to you by your health care provider. Make sure you discuss any questions you have with your health care provider. Document Released: 10/14/2015 Document Revised: 06/06/2016 Document Reviewed: 07/19/2015 Elsevier Interactive Patient Education  2017 [employer] Prevention in the Home Falls can cause injuries. They can happen to people of all ages. There are many things you can do to make your home safe and to help prevent falls. What can I do on the outside of my home? Regularly fix the edges of walkways and driveways and fix any cracks. Remove anything that might make you trip as you walk through a door, such as a raised step or threshold. Trim any bushes or trees on the path to your home. Use bright outdoor lighting. Clear any walking paths of anything that might make someone trip, such as rocks or tools. Regularly check to see if handrails are loose or broken. Make sure that both sides of any steps have handrails. Any raised decks and porches should have guardrails on the edges. Have any leaves, snow, or ice cleared regularly. Use sand or salt on walking paths during winter. Clean up any spills in your garage right away. This includes oil or grease spills. What can I do in the bathroom? Use night lights. Install grab bars by the toilet and in the tub and shower. Do not use towel bars as grab bars. Use non-skid mats or decals in the tub or shower. If you need to sit down in the shower, use a plastic, non-slip stool. Keep the floor dry. Clean up any water that spills on the floor as soon as it happens. Remove  soap buildup in the tub or shower regularly. Attach bath mats securely with double-sided non-slip rug tape. Do not have throw rugs and other things on the floor that can make you trip. What can I do in the bedroom? Use night lights. Make sure that you have a light by your bed that is easy to reach. Do not use any sheets or blankets that are too big for your bed. They should not hang down onto the floor. Have a firm chair that has side arms. You can use this for support while you get dressed. Do not have throw rugs and other things on the floor that can make you trip. What can I do in the kitchen? Clean up any spills right away. Avoid walking on wet floors. Keep items that you use a lot in easy-to-reach places. If you need to reach something above you, use a strong step stool that has a grab bar. Keep electrical cords out of the way. Do not use floor polish or wax that makes floors slippery. If you must use wax, use non-skid floor wax. Do not have throw rugs and other things on the floor that can make you trip. What can I do with my stairs? Do not leave any items on the stairs. Make sure that there are handrails on both sides of the stairs and use them. Fix handrails that are broken or loose. Make sure that handrails  are as long as the stairways. Check any carpeting to make sure that it is firmly attached to the stairs. Fix any carpet that is loose or worn. Avoid having throw rugs at the top or bottom of the stairs. If you do have throw rugs, attach them to the floor with carpet tape. Make sure that you have a light switch at the top of the stairs and the bottom of the stairs. If you do not have them, ask someone to add them for you. What else can I do to help prevent falls? Wear shoes that: Do not have high heels. Have rubber bottoms. Are comfortable and fit you well. Are closed at the toe. Do not wear sandals. If you use a stepladder: Make sure that it is fully opened. Do not climb a  closed stepladder. Make sure that both sides of the stepladder are locked into place. Ask someone to hold it for you, if possible. Clearly mark and make sure that you can see: Any grab bars or handrails. First and last steps. Where the edge of each step is. Use tools that help you move around (mobility aids) if they are needed. These include: Canes. Walkers. Scooters. Crutches. Turn on the lights when you go into a dark area. Replace any light bulbs as soon as they burn out. Set up your furniture so you have a clear path. Avoid moving your furniture around. If any of your floors are uneven, fix them. If there are any pets around you, be aware of where they are. Review your medicines with your doctor. Some medicines can make you feel dizzy. This can increase your chance of falling. Ask your doctor what other things that you can do to help prevent falls. This information is not intended to replace advice given to you by your health care provider. Make sure you discuss any questions you have with your health care provider. Document Released: 07/14/2009 Document Revised: 02/23/2016 Document Reviewed: 10/22/2014 Elsevier Interactive Patient Education  2017 [employer].

## 2021-11-30 NOTE — Telephone Encounter (Signed)
Pt needs written rx for his Viagra, so that he can send it to a mail order pharmacy to get at cheaper cost. ?Pt c/o upper back pain still. Pt states he has done PT and it did not help, asks if he can be referred to Ortho? Thank you. ?

## 2021-11-30 NOTE — Progress Notes (Signed)
Subjective:   Bradley Black is a 76 y.o. male who presents for Medicare Annual/Subsequent preventive examination.  Review of Systems     Cardiac Risk Factors include: advanced age (>48mn, >>94women);diabetes mellitus;hypertension;dyslipidemia;male gender;family history of premature cardiovascular disease     Objective:    Today's Vitals   11/30/21 1410 11/30/21 1413  BP: (!) 142/76   Pulse: 62   SpO2: 99%   PainSc:  5    There is no height or weight on file to calculate BMI.  Advanced Directives 11/30/2021 04/19/2021 12/03/2020 08/11/2020 07/28/2020 07/21/2020 07/20/2020  Does Patient Have a Medical Advance Directive? _0  No No  Would patient like information on creating a medical advance directive? No - Patient declined - No - Patient declined No - Patient declined No - Patient declined - No - Patient declined  Pre-existing out of facility DNR order (yellow form or pink MOST form) - - - - - - -    Current Medications (verified) Outpatient Encounter Medications as of 11/30/2021  Medication Sig   acetaminophen (TYLENOL) 500 MG tablet Take 1,000 mg by mouth every 6 (six) hours as needed for moderate pain or headache.   ALPRAZolam (XANAX) 0.5 MG tablet Take 1 tablet (0.5 mg total) by mouth at bedtime as needed for sleep.   aspirin EC 81 MG tablet Take 81 mg by mouth daily.   Cholecalciferol (VITAMIN D3) 25 MCG (1000 UT) CAPS Take 1 capsule (1,000 Units total) by mouth daily at 6 (six) AM.   desvenlafaxine (PRISTIQ) 25 MG 24 hr tablet TAKE ONE TABLET BY MOUTH DAILY.   diltiazem (CARDIZEM CD) 120 MG 24 hr capsule Take 120 mg by mouth daily.   doxazosin (CARDURA) 8 MG tablet Take 8 mg by mouth daily.   Dulaglutide (TRULICITY) 08.56MDJ/4.9FWSOPN Inject 0.75 mg into the skin once a week. On Thursdays.   ENTRESTO 97-103 MG TAKE ONE TABLET BY MOUTH TWICE A DAY   hydrALAZINE (APRESOLINE) 50 MG tablet Take 75 mg by mouth 3 (three) times daily.   levothyroxine (SYNTHROID) 50 MCG  tablet Take 1 tablet (50 mcg total) by mouth daily before breakfast.   levothyroxine (SYNTHROID) 50 MCG tablet TAKE 1 TABLET (50 MCG TOTAL) BY MOUTH BEFORE BREAKFAST. AS OF 08-09-21 DOSE INCREASE   multivitamin (RENA-VIT) TABS tablet Take 1 tablet by mouth daily.   pantoprazole (PROTONIX) 40 MG tablet TAKE ONE TABLET BY MOUTH TWICE A DAY   pravastatin (PRAVACHOL) 20 MG tablet Take 1 tablet (20 mg total) by mouth daily.   torsemide (DEMADEX) 20 MG tablet Take 20 mg by mouth daily as needed.   No facility-administered encounter medications on file as of 11/30/2021.    Allergies (verified) No known allergies   History: Past Medical History:  Diagnosis Date   BPH (benign prostatic hyperplasia)    CAD S/P percutaneous coronary angioplasty    a. [place] of [place] PCI with BMS to LAD in 1997 c. [place]CA [place]. s/p CABG in 11/2011 with LIMA-LAD, SVG-PDA, SVG-OM2, and SVG-D1   Cataract    Chronic back pain    CKD (chronic kidney disease), stage III (HCC)    Cyst of bursa    R shoulder   [place]    DM (diabetes mellitus), type 2 with renal complications ([place]    Essential hypertension    Frequent PVCs    GERD (gastroesophageal reflux disease)    Hypertensive  retinopathy    Ischemic cardiomyopathy 11/2011   Intra-OP TEE: EF 40-45%, no regional WMA; improved Anterior WM post CABG.   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   LIMA to LAD, SVG to D1, SVG to OM2, SVG to PDA, EVH via right thigh and leg   Past Surgical History:  Procedure Laterality Date   AV FISTULA PLACEMENT Left 07/28/2020   Procedure: LEFT ARM ARTERIOVENOUS (AV) FISTULA  CREATION;  Surgeon: Rosetta Posner, MD;  Location: [facility];  Service: Vascular;  Laterality: Left;   [place]  2013   CORONARY ANGIOPLASTY  1994   OM   CORONARY ANGIOPLASTY  1997   LAD   CORONARY ANGIOPLASTY WITH STENT PLACEMENT  1999   RCA    CORONARY ANGIOPLASTY WITH STENT PLACEMENT  2001   RCA   CORONARY ARTERY BYPASS GRAFT  12/27/2011   Procedure: CORONARY ARTERY BYPASS GRAFTING (CABG);  Surgeon: Rexene Alberts, MD;  Location: [place];  Service: Open Heart Surgery;  Laterality: N/A;  Times four. On pump. Using endoscopically harvested right greater saphenous vein and left internal mammary artery.    HERNIA REPAIR     INCISION AND DRAINAGE OF WOUND  2006   [place]   INSERTION OF DIALYSIS CATHETER Right 07/25/2020   Procedure: INSERTION OF DIALYSIS CATHETER;  Surgeon: Virl Cagey, MD;  Location: [facility];  Service: General;  Laterality: Right;   INTRAOPERATIVE TRANSESOPHAGEAL ECHOCARDIOGRAM  12/27/2011   Global hypokinesis with EF of 40-45%, improved LAD distribution wall motion.   LEFT HEART CATHETERIZATION WITH CORONARY ANGIOGRAM N/A 12/13/2011   Procedure: LEFT HEART CATHETERIZATION WITH CORONARY ANGIOGRAM;  Surgeon: Leonie Man, MD;  Location: [facility] CATH LAB;  Service: Cardiovascular;  Laterality: N/A;   LUMBAR FUSION     MASS EXCISION  10/24/11   R arm   RIGHT HEART CATH N/A 07/12/2020   Procedure: RIGHT HEART CATH;  Surgeon: Belva Crome, MD;  Location: [place] CV LAB;  Service: Cardiovascular;  Laterality: N/A;   TRANSESOPHAGEAL ECHOCARDIOGRAM  2013   Family History  Problem Relation Age of Onset   Cancer Mother        breast   Cancer Father        bone   Excell cancer Neg Hx    Social History   Socioeconomic History   Marital status: Married    Spouse name: [name]   Number of children: 1   Years of education: college   Highest education level: Bachelor's degree (e.g., BA, AB, BS)  Occupational History   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  Tobacco Use   Smoking status: Former    Types: Cigarettes    Quit date: 10/01/1986    Years since quitting: 35.1   Smokeless tobacco: Never   Tobacco comments:    quit about 30 yrs ago  Vaping Use   Vaping Use: Never used  Substance and  Sexual Activity   Alcohol use: No   Drug use: No   Sexual activity: Not Currently  Other Topics Concern   Not on file  Social History Narrative   Married,  father of one,  grandfather 53. Works out at [employer] at [employer] roughly 2-3 days a week. He works on a treadmill. He does note having a hard time getting his heart rate up.   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  He quit smoking in 1988, and does not drink alcohol.   Social Determinants of Health   Financial Resource Strain: Low Risk    Difficulty of Paying Living Expenses: Not hard at all  Food Insecurity: No Food Insecurity   Worried About Charity fundraiser in the Last Year: Never true   [place] in the Last Year: Never true  Transportation Needs: No Transportation Needs   Lack of Transportation (Medical): No   Lack of Transportation (Non-Medical): No  Physical Activity: Insufficiently Active   Days of Exercise per Week: 5 days   Minutes of Exercise per Session: 20 min  Stress: No Stress Concern Present   Feeling of Stress : Not at all  Social Connections: Socially Integrated   Frequency of Communication with Friends and Family: More than three times a week   Frequency of Social Gatherings with Friends and Family: More than three times a week   Attends Religious Services: 1 to 4 times per year   Active Member of [employer] or Organizations: No   Attends Music therapist: 1 to 4 times per year   Marital Status: Married    Tobacco Counseling Counseling given: Not Answered Tobacco comments: quit about 30 yrs ago   Clinical Intake:  Pre-visit preparation completed: Yes  Pain : 0-10 Pain Score: 5  Pain Type: Chronic pain Pain Location: Back Pain Descriptors / Indicators: Aching, Dull Pain Onset: More than a month ago Pain Frequency: Intermittent     Nutritional Risks: None Diabetes: Yes  How often do you need to have someone help you when you read instructions, pamphlets, or other  written materials from your doctor or pharmacy?: 1 - Never  Diabetic?Nutrition Risk Assessment:  Has the patient had any N/V/D within the last 2 months?  No  Does the patient have any non-healing wounds?  No  Has the patient had any unintentional weight loss or weight gain?  No   Diabetes:  Is the patient diabetic?  Yes  If diabetic, was a CBG obtained today?  No  Did the patient bring in their glucometer from home?  No  How often do you monitor your CBG's? Daily.   Financial Strains and Diabetes Management:  Are you having any financial strains with the device, your supplies or your medication? No .  Does the patient want to be seen by Chronic Care Management for management of their diabetes?  Yes  Would the patient like to be referred to a Nutritionist or for Diabetic Management?  No   Diabetic Exams:  Diabetic Eye Exam: Completed 10/2021. Pt has been advised about the importance in completing this exam.  Diabetic Foot Exam: Completed 05/01/2019. Pt has been advised about the importance in completing this exam.   Interpreter Needed?: No      Activities of Daily Living In your present state of health, do you have any difficulty performing the following activities: 11/30/2021  Hearing? Y  Vision? N  Difficulty concentrating or making decisions? Y  Comment memory  Walking or climbing stairs? N  Dressing or bathing? N  Doing errands, shopping? N  Preparing Food and eating ? N  Using the Toilet? N  In the past six months, have you accidently leaked urine? N  Do you have problems with loss of bowel control? N  Managing your Medications? N  Managing your Finances? N  Some recent data might be hidden    Patient Care Team: Susy Frizzle, MD as PCP -  General (Family Medicine) Thompson Grayer, MD as PCP - Electrophysiology (Cardiology) Branch, Alphonse Guild, MD as PCP - Cardiology (Cardiology) Gala Romney Cristopher Estimable, MD as Attending Physician (Gastroenterology) Bensimhon, Shaune Pascal,  MD as Consulting Physician (Cardiology) Liana Gerold, MD as Consulting Physician (Nephrology) Marilynn Rail Jossie Ng, NP as Nurse Practitioner (Cardiology) Early, Arvilla Meres, MD as Consulting Physician (Vascular Surgery) Dialysis, [facility] Caprice Red, MD as Consulting Physician (Nephrology)  Indicate any recent Medical Services you may have received from other than [facility] providers in the past year (date may be approximate).     Assessment:   This is a routine wellness examination for [place].  Hearing/Vision screen Hearing Screening - Comments:: Hearing issues.  Vision Screening - Comments:: Readers. Dr. Rosana Hoes in [place]. 10/2021.  Dietary issues and exercise activities discussed: Current Exercise Habits: Home exercise routine, Type of exercise: walking, Time (Minutes): 20, Frequency (Times/Week): 5, Weekly Exercise (Minutes/Week): 100, Intensity: Mild, Exercise limited by: cardiac condition(s)   Goals Addressed   None    Depression Screen PHQ 2/9 Scores 11/30/2021 03/20/2021 03/20/2021 01/09/2021 11/25/2020 11/14/2020 10/13/2020  PHQ - 2 Score _0 PHQ- 9 Score - 10 - _1 Fall Risk Fall Risk  11/30/2021 04/19/2021 10/04/2020 04/29/2020 12/15/2019  Falls in the past year? 0 0 0 1 1  Number falls in past yr: 0 - 0 0 0  Comment - - - Fell December slipping on ice fell on ice  Injury with Fall? 0 - 0 0 0  Risk for fall due to : No Fall Risks - - Medication side effect Medication side effect  Follow up Falls prevention discussed - - Falls evaluation completed;Education provided;Falls prevention discussed Falls prevention discussed;Education provided;Falls evaluation completed    FALL RISK PREVENTION PERTAINING TO THE HOME:  Any stairs in or around the home? Yes  If so, are there any without handrails? No  Home free of loose throw rugs in walkways, pet beds, electrical cords, etc? Yes  Adequate lighting in your home to reduce risk of falls? Yes   ASSISTIVE DEVICES  UTILIZED TO PREVENT FALLS:  Life alert? No  Use of a cane, walker or w/c? No  Grab bars in the bathroom? No  Shower chair or bench in shower? No  Elevated toilet seat or a handicapped toilet? No   TIMED UP AND GO:  Was the test performed? Yes .  Length of time to ambulate 10 feet: 12 sec.   Gait slow and steady without use of assistive device  Cognitive Function:     6CIT Screen 11/30/2021  What Year? 0 points  What month? 0 points  What time? 0 points  Count back from 20 0 points  Months in reverse 0 points  Repeat phrase 0 points  Total Score 0    Immunizations Immunization History  Administered Date(s) Administered   Fluad Quad(high Dose 65+) 07/10/2020   Hepatitis B, adult 09/17/2020, 10/18/2020, 11/17/2020, 04/07/2021   Hepatitis B, ped/adol 09/17/2020, 10/18/2020   Influenza Split 07/12/2015, 08/10/2016, 07/01/2017, 07/29/2019   Influenza, High Dose Seasonal PF 08/10/2016, 07/31/2017, 07/29/2019, 07/10/2020   Influenza,inj,Quad PF,6+ Mos 07/12/2015   Influenza,inj,quad, With Preservative 07/01/2017   Influenza-Unspecified 06/20/2020, 07/06/2021   PFIZER(Purple Top)SARS-COV-2 Vaccination 11/07/2019, 11/28/2019   PPD Test 09/15/2020   Pneumococcal Conjugate-13 02/12/2013, 04/19/2015   Pneumococcal Polysaccharide-23 02/12/2013   Tdap 10/01/2001   Zoster, Live 03/04/2007    TDAP status: Due, Education has been provided regarding the importance  of this vaccine. Advised may receive this vaccine at local pharmacy or Health Dept. Aware to provide a copy of the vaccination record if obtained from local pharmacy or Health Dept. Verbalized acceptance and understanding.  Flu Vaccine status: Up to date  Pneumococcal vaccine status: Up to date  Covid-19 vaccine status: Completed vaccines  Qualifies for Shingles Vaccine? Yes   Zostavax completed Yes   Shingrix Completed?: No.    Education has been provided regarding the importance of this vaccine. Patient has been  advised to call insurance company to determine out of pocket expense if they have not yet received this vaccine. Advised may also receive vaccine at local pharmacy or Health Dept. Verbalized acceptance and understanding.  Screening Tests Health Maintenance  Topic Date Due   Zoster Vaccines- Shingrix (1 of 2) Never done   TETANUS/TDAP  10/02/2011   OPHTHALMOLOGY EXAM  01/08/2018   COVID-19 Vaccine (3 - Pfizer risk series) 12/26/2019   FOOT EXAM  04/30/2020   HEMOGLOBIN A1C  01/06/2021   Pneumonia Vaccine 20+ Years old  Completed   INFLUENZA VACCINE  Completed   Hepatitis C Screening  Completed   HPV VACCINES  Aged Out   Fecal DNA (Cologuard)  Discontinued    Health Maintenance  Health Maintenance Due  Topic Date Due   Zoster Vaccines- Shingrix (1 of 2) Never done   TETANUS/TDAP  10/02/2011   OPHTHALMOLOGY EXAM  01/08/2018   COVID-19 Vaccine (3 - Pfizer risk series) 12/26/2019   FOOT EXAM  04/30/2020   HEMOGLOBIN A1C  01/06/2021    Colorectal cancer screening: Type of screening: Cologuard. Completed 02/19/2018. Repeat every 3 years  Lung Cancer Screening: (Low Dose CT Chest recommended if Age 35-80 years, 30 pack-year currently smoking OR have quit w/in 15years.) does not qualify.    Additional Screening:  Hepatitis C Screening: does qualify; Completed 07/21/2020  Vision Screening: Recommended annual ophthalmology exams for early detection of glaucoma and other disorders of the eye. Is the patient up to date with their annual eye exam?  Yes  Who is the provider or what is the name of the office in which the patient attends annual eye exams? Dr. Rosana Hoes in [place]. If pt is not established with a provider, would they like to be referred to a provider to establish care? No .   Dental Screening: Recommended annual dental exams for proper oral hygiene  Community Resource Referral / Chronic Care Management: CRR required this visit?  No   CCM required this visit?  No       Plan:     I have personally reviewed and noted the following in the patients chart:   Medical and social history Use of alcohol, tobacco or illicit drugs  Current medications and supplements including opioid prescriptions. Patient is not currently taking opioid prescriptions. Functional ability and status Nutritional status Physical activity Advanced directives List of other physicians Hospitalizations, surgeries, and ER visits in previous 12 months Vitals Screenings to include cognitive, depression, and falls Referrals and appointments  In addition, I have reviewed and discussed with patient certain preventive protocols, quality metrics, and best practice recommendations. A written personalized care plan for preventive services as well as general preventive health recommendations were provided to patient.     Chriss Driver, LPN   11/05/971   Nurse Notes: Discussed Cologuard, pt currently has kit at home and states he will complete. Discussed Shingrix and how to obtain.

## 2021-12-01 DIAGNOSIS — N186 End stage renal disease: Secondary | ICD-10-CM | POA: Diagnosis not present

## 2021-12-01 DIAGNOSIS — Z992 Dependence on renal dialysis: Secondary | ICD-10-CM | POA: Diagnosis not present

## 2021-12-02 DIAGNOSIS — N186 End stage renal disease: Secondary | ICD-10-CM | POA: Diagnosis not present

## 2021-12-02 DIAGNOSIS — Z992 Dependence on renal dialysis: Secondary | ICD-10-CM | POA: Diagnosis not present

## 2021-12-03 DIAGNOSIS — Z992 Dependence on renal dialysis: Secondary | ICD-10-CM | POA: Diagnosis not present

## 2021-12-03 DIAGNOSIS — N186 End stage renal disease: Secondary | ICD-10-CM | POA: Diagnosis not present

## 2021-12-04 DIAGNOSIS — H40053 Ocular hypertension, bilateral: Secondary | ICD-10-CM | POA: Diagnosis not present

## 2021-12-04 DIAGNOSIS — Z992 Dependence on renal dialysis: Secondary | ICD-10-CM | POA: Diagnosis not present

## 2021-12-04 DIAGNOSIS — N186 End stage renal disease: Secondary | ICD-10-CM | POA: Diagnosis not present

## 2021-12-05 DIAGNOSIS — N186 End stage renal disease: Secondary | ICD-10-CM | POA: Diagnosis not present

## 2021-12-05 DIAGNOSIS — Z992 Dependence on renal dialysis: Secondary | ICD-10-CM | POA: Diagnosis not present

## 2021-12-06 DIAGNOSIS — Z992 Dependence on renal dialysis: Secondary | ICD-10-CM | POA: Diagnosis not present

## 2021-12-06 DIAGNOSIS — N186 End stage renal disease: Secondary | ICD-10-CM | POA: Diagnosis not present

## 2021-12-07 ENCOUNTER — Other Ambulatory Visit: Payer: Self-pay | Admitting: Orthopaedic Surgery

## 2021-12-07 ENCOUNTER — Ambulatory Visit (INDEPENDENT_AMBULATORY_CARE_PROVIDER_SITE_OTHER): Payer: Medicare Other | Admitting: Orthopaedic Surgery

## 2021-12-07 ENCOUNTER — Ambulatory Visit: Payer: Medicare Other

## 2021-12-07 ENCOUNTER — Other Ambulatory Visit: Payer: Self-pay

## 2021-12-07 ENCOUNTER — Encounter: Payer: Self-pay | Admitting: Orthopaedic Surgery

## 2021-12-07 VITALS — BP 154/59 | HR 52 | Ht 68.0 in | Wt 145.0 lb

## 2021-12-07 DIAGNOSIS — Z992 Dependence on renal dialysis: Secondary | ICD-10-CM | POA: Diagnosis not present

## 2021-12-07 DIAGNOSIS — M546 Pain in thoracic spine: Secondary | ICD-10-CM

## 2021-12-07 DIAGNOSIS — M4000 Postural kyphosis, site unspecified: Secondary | ICD-10-CM | POA: Diagnosis not present

## 2021-12-07 DIAGNOSIS — G8929 Other chronic pain: Secondary | ICD-10-CM

## 2021-12-07 DIAGNOSIS — N186 End stage renal disease: Secondary | ICD-10-CM | POA: Diagnosis not present

## 2021-12-07 NOTE — Progress Notes (Signed)
GI Office Note    Referring Provider: Donita Brooks, MD Primary Care Physician:  Donita Brooks, MD Primary GI: Dr. Jena Gauss  Date:  12/08/2021  ID:  Bradley Black, DOB Aug 01, 1946, MRN [id number]   Chief Complaint   Chief Complaint  Patient presents with   Colonoscopy     History of Present Illness  Bradley Black is a 76 y.o. 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  Originally seen in the office in 2014 for IDA/anemia of chronic disease.  At this time he had heme positive stools.  In 2014 he reported that his last colonoscopy was 10 years prior to that with a history of Moises polyps, and was advised to complete EGD and colonoscopy and declined.  Beginning in January 2021 iron panel revealed iron 57, TSAT 22%, BUN 56, creatinine 3.28, eGFR 17. Repeat labs in May 2021 revealed iron 47, TIBC 281, TSAT 17%, hemoglobin 10.9, MCV 92.8, platelets 200, creatinine 2.58, BUN 45.  He received Feraheme x2 in June 2021.  Patient was last seen in office in July 2021 with infrequent bowel movements and GERD controlled on twice daily PPI.  Given iron deficiency and history of Yochanan polyps he was to be scheduled for EGD and colonoscopy.  Nephrology cleared him for MiraLAX prep with Linzess 5 days prior to procedure and patient was scheduled for colonoscopy and EGD in September 2021. Within a week prior to procedure, it became apparent the patient was having some cardiac abnormalities for which he was to be seeing an EP cardiologist a week after his scheduled procedure.  Due to needing further cardiac evaluation, his EGD and colonoscopy were canceled and he was to schedule follow-up in 3 months to reassess (about January or February 2022).  Patient never followed up at this  time.  Today:   Patient reports that he has been doing well.  Denies chest pain, heart palpitations, dysphagia, hematemesis, hematochezia.  He denies any GERD symptoms, has been taking his pantoprazole 40 mg twice daily.  He does report having dark stools, however he is getting monthly iron infusions with nephrology when he does not have his blood work done.  He does report some occasional constipation, however he reports that most days his bowel movements are normal soft consistency.  Denies any straining or incomplete emptying.  He denies any issues with swelling, has been doing well with his doxazosin, has torsemide as needed but has not had to use this.  Since he was last seen in the office his CKD has progressed to end-stage renal disease, and has been on PD dialysis nightly at home for about 1 year now.  He is oliguric.  Reports some intermittent sharp pains with PD dialysis, however they stopped once treatment is over.  Past Medical History:  Diagnosis Date   BPH (benign prostatic hyperplasia)    CAD S/P percutaneous coronary angioplasty    a. PTCA of [place] 1988 & 1994 b. PCI with BMS to LAD in 1997 c. RCA PCI BMS 1999 & 2000 d. s/p CABG in 11/2011 with LIMA-LAD, SVG-PDA, SVG-OM2, and SVG-D1   Cataract    Chronic back pain    CKD (chronic kidney disease), stage III (HCC)    Cyst of bursa    R shoulder  Diabetic retinopathy (HCC)    DM (diabetes mellitus), type 2 with renal complications (HCC)    Essential hypertension    Frequent PVCs    GERD (gastroesophageal reflux disease)    Hypertensive retinopathy    Ischemic cardiomyopathy 11/2011   Intra-OP TEE: EF 40-45%, no regional WMA; improved Anterior WM post CABG.   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   LIMA to LAD, SVG to D1, SVG to OM2, SVG to PDA, EVH via right thigh and leg    Past Surgical History:  Procedure Laterality Date   AV FISTULA PLACEMENT Left 07/28/2020   Procedure: LEFT  ARM ARTERIOVENOUS (AV) FISTULA  CREATION;  Surgeon: Larina Earthly, MD;  Location: [facility];  Service: Vascular;  Laterality: Left;   BACK SURGERY  1970   CARDIAC CATHETERIZATION  2013   CORONARY ANGIOPLASTY  1994   OM   CORONARY ANGIOPLASTY  1997   LAD   CORONARY ANGIOPLASTY WITH STENT PLACEMENT  1999   RCA   CORONARY ANGIOPLASTY WITH STENT PLACEMENT  2001   RCA   CORONARY ARTERY BYPASS GRAFT  12/27/2011   Procedure: CORONARY ARTERY BYPASS GRAFTING (CABG);  Surgeon: Purcell Nails, MD;  Location: [facility] OR;  Service: Open Heart Surgery;  Laterality: N/A;  Times four. On pump. Using endoscopically harvested right greater saphenous vein and left internal mammary artery.    HERNIA REPAIR     INCISION AND DRAINAGE OF WOUND  2006   axilla   INSERTION OF DIALYSIS CATHETER Right 07/25/2020   Procedure: INSERTION OF DIALYSIS CATHETER;  Surgeon: Lucretia Roers, MD;  Location: [facility];  Service: General;  Laterality: Right;   INTRAOPERATIVE TRANSESOPHAGEAL ECHOCARDIOGRAM  12/27/2011   Global hypokinesis with EF of 40-45%, improved LAD distribution wall motion.   LEFT HEART CATHETERIZATION WITH CORONARY ANGIOGRAM N/A 12/13/2011   Procedure: LEFT HEART CATHETERIZATION WITH CORONARY ANGIOGRAM;  Surgeon: Marykay Lex, MD;  Location: [facility] CATH LAB;  Service: Cardiovascular;  Laterality: N/A;   LUMBAR FUSION     MASS EXCISION  10/24/11   R arm   RIGHT HEART CATH N/A 07/12/2020   Procedure: RIGHT HEART CATH;  Surgeon: Lyn Records, MD;  Location: [facility] INVASIVE CV LAB;  Service: Cardiovascular;  Laterality: N/A;   TRANSESOPHAGEAL ECHOCARDIOGRAM  2013    Current Outpatient Medications  Medication Sig Dispense Refill   acetaminophen (TYLENOL) 500 MG tablet Take 1,000 mg by mouth every 6 (six) hours as needed for moderate pain or headache.     ALPRAZolam (XANAX) 0.5 MG tablet Take 1 tablet (0.5 mg total) by mouth at bedtime as needed for sleep. 30 tablet 0   aspirin EC 81 MG tablet Take 81 mg by mouth  daily.     Cholecalciferol (VITAMIN D3) 25 MCG (1000 UT) CAPS Take 1 capsule (1,000 Units total) by mouth daily at 6 (six) AM. 60 capsule 6   desvenlafaxine (PRISTIQ) 25 MG 24 hr tablet TAKE ONE TABLET BY MOUTH DAILY. 90 tablet 0   diltiazem (CARDIZEM CD) 120 MG 24 hr capsule Take 1 capsule (120 mg total) by mouth daily. 30 capsule 1   doxazosin (CARDURA) 8 MG tablet Take 8 mg by mouth daily.     ENTRESTO 97-103 MG TAKE ONE TABLET BY MOUTH TWICE A DAY 60 tablet 6   hydrALAZINE (APRESOLINE) 50 MG tablet Take 75 mg by mouth 3 (three) times daily.     levothyroxine (SYNTHROID) 50  MCG tablet Take 1 tablet (50 mcg total) by mouth daily before breakfast. 90 tablet 3   levothyroxine (SYNTHROID) 50 MCG tablet TAKE 1 TABLET (50 MCG TOTAL) BY MOUTH BEFORE BREAKFAST. AS OF 08-09-21 DOSE INCREASE 90 tablet 1   multivitamin (RENA-VIT) TABS tablet Take 1 tablet by mouth daily.     pantoprazole (PROTONIX) 40 MG tablet TAKE ONE TABLET BY MOUTH TWICE A DAY 180 tablet 3   pravastatin (PRAVACHOL) 20 MG tablet Take 1 tablet (20 mg total) by mouth daily. 90 tablet 3   torsemide (DEMADEX) 20 MG tablet Take 20 mg by mouth daily as needed.     No current facility-administered medications for this visit.    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  Tobacco Use   Smoking status: Former    Types: Cigarettes    Quit date: 10/01/1986    Years since quitting: 35.2   Smokeless tobacco: Never   Tobacco comments:    quit  about 30 yrs ago  Vaping Use   Vaping Use: Never used  Substance and Sexual Activity   Alcohol use: No   Drug use: No   Sexual activity: Not Currently  Other Topics Concern   Not on file  Social History Narrative   Married,  father of one,  grandfather 1. Works out at [employer] at [employer] roughly 2-3 days a week. He works on a treadmill. He does note having a hard time getting his heart rate up.   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   He quit smoking in 1988, and does not drink alcohol.   Social Determinants of Health   Financial Resource Strain: Low Risk    Difficulty of Paying Living Expenses: Not hard at all  Food Insecurity: No Food Insecurity   Worried About Programme researcher, broadcasting/film/video in the Last Year: Never true   Ran Out of Food in the Last Year: Never true  Transportation Needs: No Transportation Needs   Lack of Transportation (Medical): No   Lack of Transportation (Non-Medical): No  Physical Activity: Insufficiently Active   Days of Exercise per Week: 5 days   Minutes of Exercise per Session: 20 min  Stress: No Stress Concern Present   Feeling of Stress : Not at all  Social Connections: Socially Integrated   Frequency of Communication with Friends and Family: More than three times a week   Frequency of Social Gatherings with Friends and Family: More than three times a week   Attends Religious Services: 1 to 4 times per year   Active Member of [employer] or Organizations: No   Attends Engineer, structural: 1 to 4 times per year   Marital Status: Married     Review of Systems   Gen: Denies fever, chills, anorexia. Denies fatigue, weakness, weight loss.  CV: Denies chest pain,  palpitations, syncope, peripheral edema, and claudication. Resp: Denies dyspnea at rest, cough, wheezing, coughing up blood, and pleurisy. GI: Denies vomiting blood, jaundice, and fecal incontinence.   Denies dysphagia or odynophagia. Derm: Denies rash, itching, dry skin Psych: Denies  depression, anxiety, memory loss, confusion. No homicidal or suicidal ideation.  Heme: Denies bruising, bleeding, and enlarged lymph nodes.   Physical Exam   BP 116/64   Pulse 60   Temp 97.8 F (36.6 C) (Temporal)   Ht 5\' 8"  (1.727 m)   Wt 146 lb 9.6 oz (66.5 kg)   BMI 22.29 kg/m   General:   Alert and oriented. No distress noted. Pleasant and cooperative.  Head:  Normocephalic and atraumatic. Eyes:  Conjuctiva clear without scleral icterus. Mouth:  Oral mucosa pink and moist. Good dentition. No lesions. Lungs:  Clear to auscultation bilaterally. No wheezes, rales, or rhonchi. No distress.  Heart:  S1, S2 present without murmurs appreciated.  Abdomen:  +BS, soft, non-tender and non-distended. No rebound or guarding. No HSM or masses noted. PD catheter site C/D/I. Rectal: deferred Msk:  Symmetrical without gross deformities. Normal posture. Extremities:  Without edema. Neurologic:  Alert and  oriented x4 Psych:  Alert and cooperative. Normal mood and affect.   Assessment  Bradley Black is a 76 y.o. 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  Last colonoscopy was over 10 years ago.  He has a long history of IDA and he presented to the office originally in 2014 for evaluation of this anemia, EGD and colonoscopy was recommended at this time however patient declined colonoscopy, will schedule EGD, no record of this ever happening.  He then followed up again to the clinic in July 2021 for evaluation of iron deficiency anemia, he had recently received Feraheme x2, eGFR 17 at this time.  EGD and colonoscopy was offered at this time and scheduled for September 2021.  Within the week prior to his procedure he was undergoing evaluation by cardiology for bradycardia, NSVT,  frequent PVCs and decision was made to defer EGD and colonoscopy until after further evaluation with cardiology.  He has been stable from nephrology and cardiology standpoint since this time and is not having any active concerns or new symptoms.  Is not having any type of GI symptoms, GERD is controlled with twice daily PPI.  He does have a history of Mychael polyps and is well overdue for TCS.  Full work-up of IDA with EGD has never been completed.  We discussed the importance of completing both EGD and TCS.  Treatment discussed that prep choices are limited due to end-stage renal disease, aware he did not tolerate TriLyte previously we will give him a different prep this time.  He is Bradley 3 due to cardiac history and renal failure.  Due to history of constipation, we will augment this prep with Linzess 290 mcg once daily for 5 days prior to his procedure.  All risks and benefits have been discussed with the patient and he is in agreement to proceed.     PLAN    Proceed with upper endoscopy and colonoscopy by Dr. Jena Gauss in near future: the risks, benefits, and alternatives have been discussed with the patient in detail. The patient states understanding and desires to proceed. Bradley 3 Linzess 290 mcg stable,  take daily for 5 days prior to the procedure.  May skip a day if having more than 2 loose watery stools during the day. BMET and Miralax prep prior to procedure. Continue Pantoprazole 40 mg BID.   Brooke Bonito, MSN, FNP-BC, AGACNP-BC [facility]

## 2021-12-07 NOTE — Progress Notes (Signed)
Subjective:    Patient ID: Bradley Black, male    DOB: Feb 29, 1946, 76 y.o.   MRN: [id number]  HPI He has long history of mid thoracic back pain.  He had MRI in 2015 which showed bulging disc between T8 and T9.  He has kyphosis.  He has no trauma.  His pain comes and goes of the mid thoracic spine but over the last six months it just seems to stay there.  He saw [facility] 07-17-21 for this and had PT afterwards in November of 2022.  He did not improve.  He has no weakness. He is tired of hurting.  His pain is localized to the mid back and more to the right side.  Nothing seems to help other than laying down.   When he lays down, the pain goes away.  He is diabetic and has multiple medical problems.   Review of Systems  Constitutional:  Positive for activity change.  Musculoskeletal:  Positive for arthralgias and back pain.  All other systems reviewed and are negative. For Review of Systems, all other systems reviewed and are negative.  The following is a summary of the past history medically, past history surgically, known current medicines, social history and family history.  This information is gathered electronically by the computer from prior information and documentation.  I review this each visit and have found including this information at this point in the chart is beneficial and informative.   Past Medical History:  Diagnosis Date   BPH (benign prostatic hyperplasia)    CAD S/P percutaneous coronary angioplasty    a. PTCA of [place] b. PCI with BMS to LAD in 1997 c. RCA PCI [place] d. s/p CABG in 11/2011 with LIMA-LAD, SVG-PDA, SVG-OM2, and SVG-D1   Cataract    Chronic back pain    CKD (chronic kidney disease), stage III (HCC)    Cyst of bursa    R shoulder   Diabetic retinopathy ([place])    DM (diabetes mellitus), type 2 with renal complications (HCC)    Essential hypertension    Frequent PVCs    GERD (gastroesophageal reflux disease)     Hypertensive retinopathy    Ischemic cardiomyopathy 11/2011   Intra-OP TEE: EF 40-45%, no regional WMA; improved Anterior WM post CABG.   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   LIMA to LAD, SVG to D1, SVG to OM2, SVG to PDA, EVH via right thigh and leg    Past Surgical History:  Procedure Laterality Date   AV FISTULA PLACEMENT Left 07/28/2020   Procedure: LEFT ARM ARTERIOVENOUS (AV) FISTULA  CREATION;  Surgeon: Rosetta Posner, MD;  Location: [facility];  Service: Vascular;  Laterality: Left;   [place]  2013   CORONARY ANGIOPLASTY  1994   OM   CORONARY ANGIOPLASTY  1997   LAD   CORONARY ANGIOPLASTY WITH STENT PLACEMENT  1999   RCA   CORONARY ANGIOPLASTY WITH STENT PLACEMENT  2001   RCA   CORONARY ARTERY BYPASS GRAFT  12/27/2011   Procedure: CORONARY ARTERY BYPASS GRAFTING (CABG);  Surgeon: Rexene Alberts, MD;  Location: [place];  Service: Open Heart Surgery;  Laterality: N/A;  Times four. On pump. Using endoscopically harvested right greater saphenous vein and left internal mammary artery.    HERNIA REPAIR     INCISION AND DRAINAGE  OF WOUND  2006   [place]   INSERTION OF DIALYSIS CATHETER Right 07/25/2020   Procedure: INSERTION OF DIALYSIS CATHETER;  Surgeon: Virl Cagey, MD;  Location: [facility];  Service: General;  Laterality: Right;   INTRAOPERATIVE TRANSESOPHAGEAL ECHOCARDIOGRAM  12/27/2011   Global hypokinesis with EF of 40-45%, improved LAD distribution wall motion.   LEFT HEART CATHETERIZATION WITH CORONARY ANGIOGRAM N/A 12/13/2011   Procedure: LEFT HEART CATHETERIZATION WITH CORONARY ANGIOGRAM;  Surgeon: Leonie Man, MD;  Location: [facility] CATH LAB;  Service: Cardiovascular;  Laterality: N/A;   LUMBAR FUSION     MASS EXCISION  10/24/11   R arm   RIGHT HEART CATH N/A 07/12/2020   Procedure: RIGHT HEART CATH;  Surgeon: Belva Crome, MD;  Location: [place] CV LAB;  Service: Cardiovascular;   Laterality: N/A;   TRANSESOPHAGEAL ECHOCARDIOGRAM  2013    Current Outpatient Medications on File Prior to Visit  Medication Sig Dispense Refill   acetaminophen (TYLENOL) 500 MG tablet Take 1,000 mg by mouth every 6 (six) hours as needed for moderate pain or headache.     ALPRAZolam (XANAX) 0.5 MG tablet Take 1 tablet (0.5 mg total) by mouth at bedtime as needed for sleep. 30 tablet 0   aspirin EC 81 MG tablet Take 81 mg by mouth daily.     Cholecalciferol (VITAMIN D3) 25 MCG (1000 UT) CAPS Take 1 capsule (1,000 Units total) by mouth daily at 6 (six) AM. 60 capsule 6   desvenlafaxine (PRISTIQ) 25 MG 24 hr tablet TAKE ONE TABLET BY MOUTH DAILY. 90 tablet 0   diltiazem (CARDIZEM CD) 120 MG 24 hr capsule Take 1 capsule (120 mg total) by mouth daily. 30 capsule 1   doxazosin (CARDURA) 8 MG tablet Take 8 mg by mouth daily.     Dulaglutide (TRULICITY) 3.15 VV/6.1YW SOPN Inject 0.75 mg into the skin once a week. On Thursdays. 2 mL 11   ENTRESTO 97-103 MG TAKE ONE TABLET BY MOUTH TWICE A DAY 60 tablet 6   hydrALAZINE (APRESOLINE) 50 MG tablet Take 75 mg by mouth 3 (three) times daily.     levothyroxine (SYNTHROID) 50 MCG tablet Take 1 tablet (50 mcg total) by mouth daily before breakfast. 90 tablet 3   levothyroxine (SYNTHROID) 50 MCG tablet TAKE 1 TABLET (50 MCG TOTAL) BY MOUTH BEFORE BREAKFAST. AS OF 08-09-21 DOSE INCREASE 90 tablet 1   multivitamin (RENA-VIT) TABS tablet Take 1 tablet by mouth daily.     pantoprazole (PROTONIX) 40 MG tablet TAKE ONE TABLET BY MOUTH TWICE A DAY 180 tablet 3   pravastatin (PRAVACHOL) 20 MG tablet Take 1 tablet (20 mg total) by mouth daily. 90 tablet 3   torsemide (DEMADEX) 20 MG tablet Take 20 mg by mouth daily as needed.     No current facility-administered medications on file prior to visit.    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  Tobacco Use   Smoking status: Former    Types: Cigarettes    Quit date: 10/01/1986    Years since quitting: 35.2   Smokeless tobacco: Never   Tobacco comments:    quit about 30 yrs ago  Vaping Use   Vaping Use: Never used  Substance  and Sexual Activity   Alcohol use: No   Drug use: No   Sexual activity: Not Currently  Other Topics Concern   Not on file  Social History Narrative   Married,  father of one,  grandfather 34. Works out at [employer] at [employer] roughly 2-3 days a week. He works on a treadmill. He does note having a hard time getting his heart rate up.   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   He quit smoking in 1988, and does not drink alcohol.   Social Determinants of Health   Financial Resource Strain: Low Risk    Difficulty of Paying Living Expenses: Not hard at all  Food Insecurity: No Food Insecurity   Worried About Charity fundraiser in the Last Year: Never true   [place] in the Last Year: Never true  Transportation Needs: No Transportation Needs   Lack of Transportation (Medical): No   Lack of Transportation (Non-Medical): No  Physical Activity: Insufficiently Active   Days of Exercise per Week: 5 days   Minutes of Exercise per Session: 20 min  Stress: No Stress Concern Present   Feeling of Stress : Not at all  Social Connections: Socially Integrated   Frequency of Communication with Friends and Family: More than three times a week   Frequency of Social Gatherings with Friends and Family: More than three times a week   Attends Religious Services: 1 to 4 times per year   Active Member of [employer] or Organizations: No   Attends Music therapist: 1 to 4 times per year   Marital Status: Married  Human resources officer Violence: Not At Risk   Fear of Current or Ex-Partner: No   Emotionally Abused: No   Physically  Abused: No   Sexually Abused: No    Family History  Problem Relation Age of Onset   Cancer Mother        breast   Cancer Father        bone   Kim[place] cancer Neg Hx     BP (!) 154/59    Pulse (!) 52    Ht '5\' 8"'$  (1.727 m)    Wt 145 lb (65.8 kg)    BMI 22.05 kg/m   Body mass index is 22.05 kg/m.     Objective:   Physical Exam Vitals and nursing note reviewed. Exam conducted with a chaperone present.  Constitutional:      Appearance: He is well-developed.  HENT:     Head: Normocephalic and atraumatic.  Eyes:     Conjunctiva/sclera: Conjunctivae normal.     Pupils: Pupils are equal, round, and reactive to light.  Cardiovascular:     Rate and Rhythm: Normal rate and regular rhythm.  Pulmonary:     Effort: Pulmonary effort is normal.  Abdominal:     Palpations: Abdomen is soft.  Musculoskeletal:       Arms:     Cervical back: Normal range of motion and neck supple.  Skin:    General: Skin is warm and dry.  Neurological:     Mental Status: He is alert and oriented to person, place, and time.     Cranial Nerves: No cranial nerve deficit.     Motor: No abnormal muscle tone.     Coordination: Coordination normal.     Deep Tendon Reflexes: Reflexes are normal and symmetric. Reflexes normal.  Psychiatric:        Behavior: Behavior normal.  Thought Content: Thought content normal.        Judgment: Judgment normal.          Assessment & Plan:   Encounter Diagnoses  Name Primary?   Chronic midline thoracic back pain Yes   Kyphosis (acquired) (postural)    I will get MRI of the thoracic spine.  Return in two weeks.  Call if any problem.  Precautions discussed.  Electronically Signed Sanjuana Kava, MD 3/9/202311:27 AM

## 2021-12-08 ENCOUNTER — Ambulatory Visit: Payer: Medicare Other | Admitting: Gastroenterology

## 2021-12-08 ENCOUNTER — Telehealth: Payer: Self-pay

## 2021-12-08 ENCOUNTER — Encounter: Payer: Self-pay | Admitting: Gastroenterology

## 2021-12-08 VITALS — BP 116/64 | HR 60 | Temp 97.8°F | Ht 68.0 in | Wt 146.6 lb

## 2021-12-08 DIAGNOSIS — Z860100 Personal history of colon polyps, unspecified: Secondary | ICD-10-CM

## 2021-12-08 DIAGNOSIS — K219 Gastro-esophageal reflux disease without esophagitis: Secondary | ICD-10-CM

## 2021-12-08 DIAGNOSIS — D509 Iron deficiency anemia, unspecified: Secondary | ICD-10-CM | POA: Diagnosis not present

## 2021-12-08 DIAGNOSIS — N186 End stage renal disease: Secondary | ICD-10-CM | POA: Diagnosis not present

## 2021-12-08 DIAGNOSIS — K5900 Constipation, unspecified: Secondary | ICD-10-CM

## 2021-12-08 DIAGNOSIS — Z992 Dependence on renal dialysis: Secondary | ICD-10-CM | POA: Diagnosis not present

## 2021-12-08 NOTE — Telephone Encounter (Signed)
Wife called office, informed her pt will be doing Miralax prep and everything needed is OTC. ?

## 2021-12-08 NOTE — Progress Notes (Deleted)
Kidney failure - on dialysis now. One year now.  PD dialysis. ? ?No heart fluttering, denies chest pain. No dysphagia.  ? ?Dark stools. Getting iron at dialysis once a month. Then does treatments at home. ? ?No blood in stool. Oliguric. No infections. Intermittent sharp pain with treatments.does treatments at night.  ? ?Occasional constipation, does have to take anything.  ? ? ?EGD and [place].  ? ?ASA 3 - BMET ?Reach out to nephrology and cardiology for clearance.  ? ?[place] polyp history - not sure date. ? ? ?Dr. Gala Romney ? ?Couldn't do last prep - big jug - threw it up.  ? ?

## 2021-12-08 NOTE — Telephone Encounter (Signed)
Venetia Night NP wants pt to have Miralax prep for TCS. I had told pt at [place] he would need to pick up prep rx at pharmacy. Tried to call pt to let him know he will not need rx, no answer. ?

## 2021-12-08 NOTE — Telephone Encounter (Signed)
PA for TCS submitted via [facility] website. PA# [id number], valid 01/25/22-04/25/22. ?

## 2021-12-08 NOTE — Patient Instructions (Addendum)
It was a pleasure to meet you today. ? ?We are scheduling you for EGD and colonoscopy with propofol with Dr. Gala Romney in the near future. ? ?I am giving you a Linzess sample to take daily for 5 days prior to your procedure.  If you begin to have more than 2 loose or watery stools while taking this you may skip a day. ? ?It was a pleasure to see you today. I want to create trusting relationships with patients. If you receive a survey regarding your visit,  I greatly appreciate you taking time to fill this out on paper or through your MyChart. I value your feedback. ? ?Venetia Night, MSN, FNP-BC, AGACNP-BC ?[facility] ?

## 2021-12-09 DIAGNOSIS — Z992 Dependence on renal dialysis: Secondary | ICD-10-CM | POA: Diagnosis not present

## 2021-12-09 DIAGNOSIS — N186 End stage renal disease: Secondary | ICD-10-CM | POA: Diagnosis not present

## 2021-12-10 DIAGNOSIS — N186 End stage renal disease: Secondary | ICD-10-CM | POA: Diagnosis not present

## 2021-12-10 DIAGNOSIS — Z992 Dependence on renal dialysis: Secondary | ICD-10-CM | POA: Diagnosis not present

## 2021-12-11 DIAGNOSIS — Z992 Dependence on renal dialysis: Secondary | ICD-10-CM | POA: Diagnosis not present

## 2021-12-11 DIAGNOSIS — N186 End stage renal disease: Secondary | ICD-10-CM | POA: Diagnosis not present

## 2021-12-11 NOTE — Telephone Encounter (Signed)
PA# below is for TCS/EGD. ?

## 2021-12-12 DIAGNOSIS — N186 End stage renal disease: Secondary | ICD-10-CM | POA: Diagnosis not present

## 2021-12-12 DIAGNOSIS — Z992 Dependence on renal dialysis: Secondary | ICD-10-CM | POA: Diagnosis not present

## 2021-12-13 DIAGNOSIS — Z992 Dependence on renal dialysis: Secondary | ICD-10-CM | POA: Diagnosis not present

## 2021-12-13 DIAGNOSIS — N186 End stage renal disease: Secondary | ICD-10-CM | POA: Diagnosis not present

## 2021-12-14 DIAGNOSIS — N186 End stage renal disease: Secondary | ICD-10-CM | POA: Diagnosis not present

## 2021-12-14 DIAGNOSIS — Z992 Dependence on renal dialysis: Secondary | ICD-10-CM | POA: Diagnosis not present

## 2021-12-15 DIAGNOSIS — Z992 Dependence on renal dialysis: Secondary | ICD-10-CM | POA: Diagnosis not present

## 2021-12-15 DIAGNOSIS — N186 End stage renal disease: Secondary | ICD-10-CM | POA: Diagnosis not present

## 2021-12-16 DIAGNOSIS — N186 End stage renal disease: Secondary | ICD-10-CM | POA: Diagnosis not present

## 2021-12-16 DIAGNOSIS — Z992 Dependence on renal dialysis: Secondary | ICD-10-CM | POA: Diagnosis not present

## 2021-12-17 DIAGNOSIS — Z992 Dependence on renal dialysis: Secondary | ICD-10-CM | POA: Diagnosis not present

## 2021-12-17 DIAGNOSIS — N186 End stage renal disease: Secondary | ICD-10-CM | POA: Diagnosis not present

## 2021-12-18 DIAGNOSIS — Z992 Dependence on renal dialysis: Secondary | ICD-10-CM | POA: Diagnosis not present

## 2021-12-18 DIAGNOSIS — N186 End stage renal disease: Secondary | ICD-10-CM | POA: Diagnosis not present

## 2021-12-19 DIAGNOSIS — N186 End stage renal disease: Secondary | ICD-10-CM | POA: Diagnosis not present

## 2021-12-19 DIAGNOSIS — Z992 Dependence on renal dialysis: Secondary | ICD-10-CM | POA: Diagnosis not present

## 2021-12-20 DIAGNOSIS — Z992 Dependence on renal dialysis: Secondary | ICD-10-CM | POA: Diagnosis not present

## 2021-12-20 DIAGNOSIS — N186 End stage renal disease: Secondary | ICD-10-CM | POA: Diagnosis not present

## 2021-12-21 DIAGNOSIS — Z992 Dependence on renal dialysis: Secondary | ICD-10-CM | POA: Diagnosis not present

## 2021-12-21 DIAGNOSIS — N186 End stage renal disease: Secondary | ICD-10-CM | POA: Diagnosis not present

## 2021-12-22 ENCOUNTER — Other Ambulatory Visit: Payer: Self-pay

## 2021-12-22 ENCOUNTER — Ambulatory Visit (HOSPITAL_COMMUNITY)
Admission: RE | Admit: 2021-12-22 | Discharge: 2021-12-22 | Disposition: A | Discharge status: Home / Self Care | Payer: Medicare Other | Source: Ambulatory Visit | Attending: Orthopaedic Surgery | Admitting: Orthopaedic Surgery

## 2021-12-22 DIAGNOSIS — N186 End stage renal disease: Secondary | ICD-10-CM | POA: Diagnosis not present

## 2021-12-22 DIAGNOSIS — M549 Dorsalgia, unspecified: Secondary | ICD-10-CM | POA: Diagnosis not present

## 2021-12-22 DIAGNOSIS — M4000 Postural kyphosis, site unspecified: Secondary | ICD-10-CM | POA: Diagnosis not present

## 2021-12-22 DIAGNOSIS — G8929 Other chronic pain: Secondary | ICD-10-CM | POA: Insufficient documentation

## 2021-12-22 DIAGNOSIS — M40204 Unspecified kyphosis, thoracic region: Secondary | ICD-10-CM | POA: Diagnosis not present

## 2021-12-22 DIAGNOSIS — M546 Pain in thoracic spine: Secondary | ICD-10-CM | POA: Diagnosis not present

## 2021-12-22 DIAGNOSIS — Z992 Dependence on renal dialysis: Secondary | ICD-10-CM | POA: Diagnosis not present

## 2021-12-23 DIAGNOSIS — N186 End stage renal disease: Secondary | ICD-10-CM | POA: Diagnosis not present

## 2021-12-23 DIAGNOSIS — Z992 Dependence on renal dialysis: Secondary | ICD-10-CM | POA: Diagnosis not present

## 2021-12-24 DIAGNOSIS — Z992 Dependence on renal dialysis: Secondary | ICD-10-CM | POA: Diagnosis not present

## 2021-12-24 DIAGNOSIS — N186 End stage renal disease: Secondary | ICD-10-CM | POA: Diagnosis not present

## 2021-12-25 DIAGNOSIS — N186 End stage renal disease: Secondary | ICD-10-CM | POA: Diagnosis not present

## 2021-12-25 DIAGNOSIS — Z992 Dependence on renal dialysis: Secondary | ICD-10-CM | POA: Diagnosis not present

## 2021-12-26 ENCOUNTER — Other Ambulatory Visit: Payer: Self-pay

## 2021-12-26 ENCOUNTER — Encounter: Payer: Self-pay | Admitting: Orthopaedic Surgery

## 2021-12-26 ENCOUNTER — Other Ambulatory Visit: Payer: Self-pay | Admitting: Cardiology

## 2021-12-26 ENCOUNTER — Ambulatory Visit: Payer: Medicare Other | Admitting: Orthopaedic Surgery

## 2021-12-26 DIAGNOSIS — Z992 Dependence on renal dialysis: Secondary | ICD-10-CM | POA: Diagnosis not present

## 2021-12-26 DIAGNOSIS — M546 Pain in thoracic spine: Secondary | ICD-10-CM | POA: Diagnosis not present

## 2021-12-26 DIAGNOSIS — M4000 Postural kyphosis, site unspecified: Secondary | ICD-10-CM

## 2021-12-26 DIAGNOSIS — G8929 Other chronic pain: Secondary | ICD-10-CM | POA: Diagnosis not present

## 2021-12-26 DIAGNOSIS — N186 End stage renal disease: Secondary | ICD-10-CM | POA: Diagnosis not present

## 2021-12-26 NOTE — Progress Notes (Signed)
My back is still hurting ? ?He had MRI of the thoracic spine and it showed: ?IMPRESSION: ?1. 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 No significant spinal ?stenosis. ?2. 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 ?I have explained the findings to him.  I will have neurosurgery see him. ? ?I have independently reviewed the MRI.   ? ?He continues to have mid thoracic pain.  NV intact. ? ?Encounter Diagnoses  ?Name Primary?  ? Chronic midline thoracic back pain Yes  ? Kyphosis (acquired) (postural)   ? ?To see neurosurgeon. ? ?Call if any problem. ? ?Precautions discussed. ? ?Electronically Signed ?Sanjuana Kava, MD ?3/28/202310:29 AM ? ?

## 2021-12-27 DIAGNOSIS — Z992 Dependence on renal dialysis: Secondary | ICD-10-CM | POA: Diagnosis not present

## 2021-12-27 DIAGNOSIS — N186 End stage renal disease: Secondary | ICD-10-CM | POA: Diagnosis not present

## 2021-12-28 DIAGNOSIS — Z992 Dependence on renal dialysis: Secondary | ICD-10-CM | POA: Diagnosis not present

## 2021-12-28 DIAGNOSIS — N186 End stage renal disease: Secondary | ICD-10-CM | POA: Diagnosis not present

## 2021-12-29 DIAGNOSIS — Z992 Dependence on renal dialysis: Secondary | ICD-10-CM | POA: Diagnosis not present

## 2021-12-29 DIAGNOSIS — N186 End stage renal disease: Secondary | ICD-10-CM | POA: Diagnosis not present

## 2021-12-30 DIAGNOSIS — N186 End stage renal disease: Secondary | ICD-10-CM | POA: Diagnosis not present

## 2021-12-30 DIAGNOSIS — Z992 Dependence on renal dialysis: Secondary | ICD-10-CM | POA: Diagnosis not present

## 2021-12-31 DIAGNOSIS — Z992 Dependence on renal dialysis: Secondary | ICD-10-CM | POA: Diagnosis not present

## 2021-12-31 DIAGNOSIS — N186 End stage renal disease: Secondary | ICD-10-CM | POA: Diagnosis not present

## 2022-01-01 DIAGNOSIS — Z992 Dependence on renal dialysis: Secondary | ICD-10-CM | POA: Diagnosis not present

## 2022-01-01 DIAGNOSIS — N186 End stage renal disease: Secondary | ICD-10-CM | POA: Diagnosis not present

## 2022-01-02 DIAGNOSIS — E1122 Type 2 diabetes mellitus with diabetic chronic kidney disease: Secondary | ICD-10-CM | POA: Diagnosis not present

## 2022-01-02 DIAGNOSIS — Z992 Dependence on renal dialysis: Secondary | ICD-10-CM | POA: Diagnosis not present

## 2022-01-02 DIAGNOSIS — N186 End stage renal disease: Secondary | ICD-10-CM | POA: Diagnosis not present

## 2022-01-03 DIAGNOSIS — Z992 Dependence on renal dialysis: Secondary | ICD-10-CM | POA: Diagnosis not present

## 2022-01-03 DIAGNOSIS — N186 End stage renal disease: Secondary | ICD-10-CM | POA: Diagnosis not present

## 2022-01-04 DIAGNOSIS — N186 End stage renal disease: Secondary | ICD-10-CM | POA: Diagnosis not present

## 2022-01-04 DIAGNOSIS — I10 Essential (primary) hypertension: Secondary | ICD-10-CM | POA: Diagnosis not present

## 2022-01-04 DIAGNOSIS — M4004 Postural kyphosis, thoracic region: Secondary | ICD-10-CM | POA: Diagnosis not present

## 2022-01-04 DIAGNOSIS — Z992 Dependence on renal dialysis: Secondary | ICD-10-CM | POA: Diagnosis not present

## 2022-01-05 DIAGNOSIS — N186 End stage renal disease: Secondary | ICD-10-CM | POA: Diagnosis not present

## 2022-01-05 DIAGNOSIS — Z992 Dependence on renal dialysis: Secondary | ICD-10-CM | POA: Diagnosis not present

## 2022-01-06 DIAGNOSIS — Z992 Dependence on renal dialysis: Secondary | ICD-10-CM | POA: Diagnosis not present

## 2022-01-06 DIAGNOSIS — N186 End stage renal disease: Secondary | ICD-10-CM | POA: Diagnosis not present

## 2022-01-07 DIAGNOSIS — N186 End stage renal disease: Secondary | ICD-10-CM | POA: Diagnosis not present

## 2022-01-07 DIAGNOSIS — Z992 Dependence on renal dialysis: Secondary | ICD-10-CM | POA: Diagnosis not present

## 2022-01-08 DIAGNOSIS — N186 End stage renal disease: Secondary | ICD-10-CM | POA: Diagnosis not present

## 2022-01-08 DIAGNOSIS — Z992 Dependence on renal dialysis: Secondary | ICD-10-CM | POA: Diagnosis not present

## 2022-01-09 DIAGNOSIS — N186 End stage renal disease: Secondary | ICD-10-CM | POA: Diagnosis not present

## 2022-01-09 DIAGNOSIS — Z992 Dependence on renal dialysis: Secondary | ICD-10-CM | POA: Diagnosis not present

## 2022-01-10 DIAGNOSIS — N186 End stage renal disease: Secondary | ICD-10-CM | POA: Diagnosis not present

## 2022-01-10 DIAGNOSIS — Z992 Dependence on renal dialysis: Secondary | ICD-10-CM | POA: Diagnosis not present

## 2022-01-11 DIAGNOSIS — Z992 Dependence on renal dialysis: Secondary | ICD-10-CM | POA: Diagnosis not present

## 2022-01-11 DIAGNOSIS — N186 End stage renal disease: Secondary | ICD-10-CM | POA: Diagnosis not present

## 2022-01-12 DIAGNOSIS — Z992 Dependence on renal dialysis: Secondary | ICD-10-CM | POA: Diagnosis not present

## 2022-01-12 DIAGNOSIS — N186 End stage renal disease: Secondary | ICD-10-CM | POA: Diagnosis not present

## 2022-01-13 DIAGNOSIS — Z992 Dependence on renal dialysis: Secondary | ICD-10-CM | POA: Diagnosis not present

## 2022-01-13 DIAGNOSIS — N186 End stage renal disease: Secondary | ICD-10-CM | POA: Diagnosis not present

## 2022-01-14 DIAGNOSIS — N186 End stage renal disease: Secondary | ICD-10-CM | POA: Diagnosis not present

## 2022-01-14 DIAGNOSIS — Z992 Dependence on renal dialysis: Secondary | ICD-10-CM | POA: Diagnosis not present

## 2022-01-15 DIAGNOSIS — Z992 Dependence on renal dialysis: Secondary | ICD-10-CM | POA: Diagnosis not present

## 2022-01-15 DIAGNOSIS — N186 End stage renal disease: Secondary | ICD-10-CM | POA: Diagnosis not present

## 2022-01-16 DIAGNOSIS — Z992 Dependence on renal dialysis: Secondary | ICD-10-CM | POA: Diagnosis not present

## 2022-01-16 DIAGNOSIS — N186 End stage renal disease: Secondary | ICD-10-CM | POA: Diagnosis not present

## 2022-01-17 DIAGNOSIS — Z992 Dependence on renal dialysis: Secondary | ICD-10-CM | POA: Diagnosis not present

## 2022-01-17 DIAGNOSIS — N186 End stage renal disease: Secondary | ICD-10-CM | POA: Diagnosis not present

## 2022-01-18 DIAGNOSIS — N186 End stage renal disease: Secondary | ICD-10-CM | POA: Diagnosis not present

## 2022-01-18 DIAGNOSIS — Z992 Dependence on renal dialysis: Secondary | ICD-10-CM | POA: Diagnosis not present

## 2022-01-19 DIAGNOSIS — Z992 Dependence on renal dialysis: Secondary | ICD-10-CM | POA: Diagnosis not present

## 2022-01-19 DIAGNOSIS — N186 End stage renal disease: Secondary | ICD-10-CM | POA: Diagnosis not present

## 2022-01-20 DIAGNOSIS — Z992 Dependence on renal dialysis: Secondary | ICD-10-CM | POA: Diagnosis not present

## 2022-01-20 DIAGNOSIS — N186 End stage renal disease: Secondary | ICD-10-CM | POA: Diagnosis not present

## 2022-01-21 DIAGNOSIS — N186 End stage renal disease: Secondary | ICD-10-CM | POA: Diagnosis not present

## 2022-01-21 DIAGNOSIS — Z992 Dependence on renal dialysis: Secondary | ICD-10-CM | POA: Diagnosis not present

## 2022-01-22 DIAGNOSIS — N186 End stage renal disease: Secondary | ICD-10-CM | POA: Diagnosis not present

## 2022-01-22 DIAGNOSIS — Z992 Dependence on renal dialysis: Secondary | ICD-10-CM | POA: Diagnosis not present

## 2022-01-22 NOTE — Patient Instructions (Signed)
? ? Bradley Black ? 01/22/2022  ?  ? '@PREFPERIOPPHARMACY'$ @ ? ? Your procedure is scheduled on 01/25/2022. ? Report to [facility] at 9:00 A.M. ? Call this number if you have problems the morning of surgery: ? [phone] ? ? Remember: ?  ? Please follow the diet and prep instructions given to you by Dr Roseanne Kaufman office.  ?  ? Take these medicines the morning of surgery with A SIP OF WATER : Cardizem, Cardura, Synthroid and Protonix ?  ? Do not wear jewelry, make-up or nail polish. ? Do not wear lotions, powders, or perfumes, or deodorant. ? Do not shave 48 hours prior to surgery.  Men may shave face and neck. ? Do not bring valuables to the hospital. ? [facility] is not responsible for any belongings or valuables. ? ?Contacts, dentures or bridgework may not be worn into surgery.  Leave your suitcase in the car.  After surgery it may be brought to your room. ? ?For patients admitted to the hospital, discharge time will be determined by your treatment team. ? ?Patients discharged the day of surgery will not be allowed to drive home.  ? ?Name and phone number of your driver:   Family ?Special instructions:  N/A ? ?Please read over the following fact sheets that you were given. ?Care and Recovery After Surgery ? ?Colonoscopy, Adult ?A colonoscopy is a procedure to look at the entire large intestine. This procedure is done using a long, thin, flexible tube that has a camera on the end. ?You may have a colonoscopy: ?As a part of normal colorectal screening. ?If you have certain symptoms, such as: ?A low number of red blood cells in your blood (anemia). ?Diarrhea that does not go away. ?Pain in your abdomen. ?Blood in your stool. ?A colonoscopy can help screen for and diagnose medical problems, including: ?An abnormal growth of cells or tissue (tumor). ?Abnormal growths within the lining of your intestine (polyps). ?Inflammation. ?Areas of bleeding. ?Tell your health care provider about: ?Any allergies you have. ?All  medicines you are taking, including vitamins, herbs, eye drops, creams, and over-the-counter medicines. ?Any problems you or family members have had with anesthetic medicines. ?Any bleeding problems you have. ?Any surgeries you have had. ?Any medical conditions you have. ?Any problems you have had with having bowel movements. ?Whether you are pregnant or may be pregnant. ?What are the risks? ?Generally, this is a safe procedure. However, problems may occur, including: ?Bleeding. ?Damage to your intestine. ?Allergic reactions to medicines given during the procedure. ?Infection. This is rare. ?What happens before the procedure? ?Eating and drinking restrictions ?Follow instructions from your health care provider about eating or drinking restrictions, which may include: ?A few days before the procedure: ?Follow a low-fiber diet. ?Avoid nuts, seeds, dried fruit, raw fruits, and vegetables. ?1-3 days before the procedure: ?Eat only gelatin dessert or ice pops. ?Drink only clear liquids, such as water, clear juice, clear broth or bouillon, black coffee or tea, or clear soft drinks or sports drinks. ?Avoid liquids that contain red or purple dye. ?The day of the procedure: ?Do not eat solid foods. You may continue to drink clear liquids until up to 2 hours before the procedure. ?Do not eat or drink anything starting 2 hours before the procedure, or within the time period that your health care provider recommends. ?Bowel prep ?If you were prescribed a bowel prep to take by mouth (orally) to clean out your Bradley Black: ?Take it as told by your health  care provider. Starting the day before your procedure, you will need to drink a large amount of liquid medicine. The liquid will cause you to have many bowel movements of loose stool until your stool becomes almost clear or light green. ?If your skin or the opening between the buttocks (anus) gets irritated from diarrhea, you may relieve the irritation using: ?Wipes with medicine in  them, such as adult wet wipes with aloe and vitamin E. ?A product to soothe skin, such as petroleum jelly. ?If you vomit while drinking the bowel prep: ?Take a break for up to 60 minutes. ?Begin the bowel prep again. ?Call your health care provider if you keep vomiting or you cannot take the bowel prep without vomiting. ?To clean out your Bradley Black, you may also be given: ?Laxative medicines. These help you have a bowel movement. ?Instructions for enema use. An enema is liquid medicine injected into your rectum. ?Medicines ?Ask your health care provider about: ?Changing or stopping your regular medicines or supplements. This is especially important if you are taking iron supplements, diabetes medicines, or blood thinners. ?Taking medicines such as aspirin and ibuprofen. These medicines can thin your blood. Do not take these medicines unless your health care provider tells you to take them. ?Taking over-the-counter medicines, vitamins, herbs, and supplements. ?General instructions ?Ask your health care provider what steps will be taken to help prevent infection. These may include washing skin with a germ-killing soap. ?If you will be going home right after the procedure, plan to have a responsible adult: ?Take you home from the hospital or clinic. You will not be allowed to drive. ?Care for you for the time you are told. ?What happens during the procedure? ? ?An IV will be inserted into one of your veins. ?You will be given a medicine to make you fall asleep (general anesthetic). ?You will lie on your side with your knees bent. ?A lubricant will be put on the tube. Then the tube will be: ?Inserted into your anus. ?Gently eased through all parts of your large intestine. ?Air will be sent into your Traeger to keep it open. This may cause some pressure or cramping. ?Images will be taken with the camera and will appear on a screen. ?A small tissue sample may be removed to be looked at under a microscope (biopsy). The tissue  may be sent to a lab for testing if any signs of problems are found. ?If small polyps are found, they may be removed and checked for cancer cells. ?When the procedure is finished, the tube will be removed. ?The procedure may vary among health care providers and hospitals. ?What happens after the procedure? ?Your blood pressure, heart rate, breathing rate, and blood oxygen level will be monitored until you leave the hospital or clinic. ?You may have a small amount of blood in your stool. ?You may pass gas and have mild cramping or bloating in your abdomen. This is caused by the air that was used to open your Buryl during the exam. ?If you were given a sedative during the procedure, it can affect you for several hours. Do not drive or operate machinery until your health care provider says that it is safe. ?It is up to you to get the results of your procedure. Ask your health care provider, or the department that is doing the procedure, when your results will be ready. ?Summary ?A colonoscopy is a procedure to look at the entire large intestine. ?Follow instructions from your health care provider about  eating and drinking before the procedure. ?If you were prescribed an oral bowel prep to clean out your Audiel, take it as told by your health care provider. ?During the colonoscopy, a flexible tube with a camera on its end is inserted into the anus and then passed into all parts of the large intestine. ?This information is not intended to replace advice given to you by your health care provider. Make sure you discuss any questions you have with your health care provider. ?Document Revised: 09/11/2021 Document Reviewed: 05/10/2021 ?Elsevier Patient Education ? [place]. ? ? ?Upper Endoscopy, Adult ?Upper endoscopy is a procedure to look inside the upper GI (gastrointestinal) tract. The upper GI tract is made up of: ?The esophagus. This is the part of the body that moves food from your mouth to your stomach. ?The  stomach. ?The duodenum. This is the first part of your small intestine. ?This procedure is also called esophagogastroduodenoscopy (EGD) or gastroscopy. In this procedure, your health care provider passes a thin,

## 2022-01-23 ENCOUNTER — Other Ambulatory Visit (HOSPITAL_COMMUNITY): Payer: Medicare Other

## 2022-01-23 ENCOUNTER — Encounter (HOSPITAL_COMMUNITY)
Admission: RE | Admit: 2022-01-23 | Discharge: 2022-01-23 | Disposition: A | Discharge status: Home / Self Care | Payer: Medicare Other | Source: Ambulatory Visit | Attending: Internal Medicine | Admitting: Internal Medicine

## 2022-01-23 ENCOUNTER — Encounter (HOSPITAL_COMMUNITY): Payer: Medicare Other

## 2022-01-23 ENCOUNTER — Other Ambulatory Visit: Payer: Self-pay | Admitting: Family Medicine

## 2022-01-23 DIAGNOSIS — N186 End stage renal disease: Secondary | ICD-10-CM | POA: Diagnosis not present

## 2022-01-23 DIAGNOSIS — Z992 Dependence on renal dialysis: Secondary | ICD-10-CM | POA: Diagnosis not present

## 2022-01-24 DIAGNOSIS — Z992 Dependence on renal dialysis: Secondary | ICD-10-CM | POA: Diagnosis not present

## 2022-01-24 DIAGNOSIS — N186 End stage renal disease: Secondary | ICD-10-CM | POA: Diagnosis not present

## 2022-01-25 ENCOUNTER — Encounter (HOSPITAL_COMMUNITY): Payer: Self-pay | Admitting: Internal Medicine

## 2022-01-25 ENCOUNTER — Ambulatory Visit (HOSPITAL_COMMUNITY): Admit: 2022-01-25 | Payer: Medicare Other | Admitting: Internal Medicine

## 2022-01-25 ENCOUNTER — Ambulatory Visit (HOSPITAL_COMMUNITY): Payer: Medicare Other | Admitting: Anesthesiology

## 2022-01-25 ENCOUNTER — Ambulatory Visit (HOSPITAL_BASED_OUTPATIENT_CLINIC_OR_DEPARTMENT_OTHER): Payer: Medicare Other | Admitting: Anesthesiology

## 2022-01-25 ENCOUNTER — Encounter (HOSPITAL_COMMUNITY)
Admission: RE | Disposition: A | Discharge status: Home / Self Care | Payer: Self-pay | Source: Home / Self Care | Attending: Internal Medicine

## 2022-01-25 ENCOUNTER — Ambulatory Visit (HOSPITAL_COMMUNITY)
Admission: RE | Admit: 2022-01-25 | Discharge: 2022-01-25 | Disposition: A | Discharge status: Home / Self Care | Payer: Medicare Other | Attending: Internal Medicine | Admitting: Internal Medicine

## 2022-01-25 ENCOUNTER — Encounter (HOSPITAL_COMMUNITY): Payer: Self-pay

## 2022-01-25 DIAGNOSIS — I2510 Atherosclerotic heart disease of native coronary artery without angina pectoris: Secondary | ICD-10-CM | POA: Insufficient documentation

## 2022-01-25 DIAGNOSIS — K635 Polyp of colon: Secondary | ICD-10-CM | POA: Diagnosis not present

## 2022-01-25 DIAGNOSIS — Z79899 Other long term (current) drug therapy: Secondary | ICD-10-CM | POA: Diagnosis not present

## 2022-01-25 DIAGNOSIS — K2950 Unspecified chronic gastritis without bleeding: Secondary | ICD-10-CM | POA: Diagnosis not present

## 2022-01-25 DIAGNOSIS — K5730 Diverticulosis of large intestine without perforation or abscess without bleeding: Secondary | ICD-10-CM | POA: Insufficient documentation

## 2022-01-25 DIAGNOSIS — Z951 Presence of aortocoronary bypass graft: Secondary | ICD-10-CM | POA: Insufficient documentation

## 2022-01-25 DIAGNOSIS — D124 Benign neoplasm of descending colon: Secondary | ICD-10-CM | POA: Diagnosis not present

## 2022-01-25 DIAGNOSIS — K2270 Barrett's esophagus without dysplasia: Secondary | ICD-10-CM | POA: Diagnosis not present

## 2022-01-25 DIAGNOSIS — E1122 Type 2 diabetes mellitus with diabetic chronic kidney disease: Secondary | ICD-10-CM | POA: Insufficient documentation

## 2022-01-25 DIAGNOSIS — D122 Benign neoplasm of ascending colon: Secondary | ICD-10-CM | POA: Diagnosis not present

## 2022-01-25 DIAGNOSIS — D509 Iron deficiency anemia, unspecified: Secondary | ICD-10-CM

## 2022-01-25 DIAGNOSIS — R1013 Epigastric pain: Secondary | ICD-10-CM

## 2022-01-25 DIAGNOSIS — K319 Disease of stomach and duodenum, unspecified: Secondary | ICD-10-CM | POA: Diagnosis not present

## 2022-01-25 DIAGNOSIS — D123 Benign neoplasm of transverse colon: Secondary | ICD-10-CM | POA: Diagnosis not present

## 2022-01-25 DIAGNOSIS — N183 Chronic kidney disease, stage 3 unspecified: Secondary | ICD-10-CM | POA: Insufficient documentation

## 2022-01-25 DIAGNOSIS — Z992 Dependence on renal dialysis: Secondary | ICD-10-CM | POA: Insufficient documentation

## 2022-01-25 DIAGNOSIS — I120 Hypertensive chronic kidney disease with stage 5 chronic kidney disease or end stage renal disease: Secondary | ICD-10-CM | POA: Diagnosis not present

## 2022-01-25 DIAGNOSIS — Z87891 Personal history of nicotine dependence: Secondary | ICD-10-CM | POA: Diagnosis not present

## 2022-01-25 DIAGNOSIS — I252 Old myocardial infarction: Secondary | ICD-10-CM | POA: Diagnosis not present

## 2022-01-25 DIAGNOSIS — K219 Gastro-esophageal reflux disease without esophagitis: Secondary | ICD-10-CM | POA: Insufficient documentation

## 2022-01-25 DIAGNOSIS — N186 End stage renal disease: Secondary | ICD-10-CM | POA: Diagnosis not present

## 2022-01-25 DIAGNOSIS — Z955 Presence of coronary angioplasty implant and graft: Secondary | ICD-10-CM | POA: Insufficient documentation

## 2022-01-25 DIAGNOSIS — I129 Hypertensive chronic kidney disease with stage 1 through stage 4 chronic kidney disease, or unspecified chronic kidney disease: Secondary | ICD-10-CM | POA: Insufficient documentation

## 2022-01-25 DIAGNOSIS — Z7984 Long term (current) use of oral hypoglycemic drugs: Secondary | ICD-10-CM | POA: Diagnosis not present

## 2022-01-25 DIAGNOSIS — Q438 Other specified congenital malformations of intestine: Secondary | ICD-10-CM | POA: Insufficient documentation

## 2022-01-25 HISTORY — PX: ESOPHAGOGASTRODUODENOSCOPY (EGD) WITH PROPOFOL: SHX5813

## 2022-01-25 HISTORY — PX: COLONOSCOPY WITH PROPOFOL: SHX5780

## 2022-01-25 HISTORY — PX: BIOPSY: SHX5522

## 2022-01-25 HISTORY — PX: POLYPECTOMY: SHX5525

## 2022-01-25 SURGERY — COLONOSCOPY WITH PROPOFOL
Anesthesia: Monitor Anesthesia Care

## 2022-01-25 SURGERY — COLONOSCOPY WITH PROPOFOL
Anesthesia: General

## 2022-01-25 MED ORDER — STERILE WATER FOR IRRIGATION IR SOLN
Status: DC | PRN
  Administered 2022-01-25: 240 mL

## 2022-01-25 MED ORDER — PROPOFOL 10 MG/ML IV BOLUS
INTRAVENOUS | Status: DC | PRN
  Administered 2022-01-25: 80 mg via INTRAVENOUS
  Administered 2022-01-25: 40 mg via INTRAVENOUS
  Administered 2022-01-25: 20 mg via INTRAVENOUS
  Administered 2022-01-25: 30 mg via INTRAVENOUS
  Administered 2022-01-25: 10 mg via INTRAVENOUS
  Administered 2022-01-25: 20 mg via INTRAVENOUS
  Administered 2022-01-25 (×2): 10 mg via INTRAVENOUS

## 2022-01-25 MED ORDER — LACTATED RINGERS IV SOLN: INTRAVENOUS | Status: DC

## 2022-01-25 MED ORDER — LIDOCAINE HCL (CARDIAC) PF 50 MG/5ML IV SOSY
PREFILLED_SYRINGE | INTRAVENOUS | Status: DC | PRN
  Administered 2022-01-25: 50 mg via INTRAVENOUS

## 2022-01-25 MED ORDER — PHENYLEPHRINE HCL (PRESSORS) 10 MG/ML IV SOLN
INTRAVENOUS | Status: DC | PRN
  Administered 2022-01-25 (×3): 80 ug via INTRAVENOUS

## 2022-01-25 NOTE — Op Note (Signed)
[facility] ?Patient Name: Bradley Black ?Procedure Date: 01/25/2022 10:15 AM ?MRN: [id number] ?Date of Birth: 1946-05-28 ?Attending MD: Norvel Richards , MD ?CSN: [id number] ?Age: 76 ?Admit Type: Outpatient ?Procedure:                Upper GI endoscopy ?Indications:              Epigastric abdominal pain ?Providers:                Norvel Richards, MD, Lambert Mody,  ?                          Kristine L. Risa Grill, Technician ?Referring MD:              ?Medicines:                Propofol per Anesthesia ?Complications:            No immediate complications. ?Estimated Blood Loss:     Estimated blood loss was minimal. ?Procedure:                Pre-Anesthesia Assessment: ?                          - Prior to the procedure, a History and Physical  ?                          was performed, and patient medications and  ?                          allergies were reviewed. The patient's tolerance of  ?                          previous anesthesia was also reviewed. The risks  ?                          and benefits of the procedure and the sedation  ?                          options and risks were discussed with the patient.  ?                          All questions were answered, and informed consent  ?                          was obtained. Prior Anticoagulants: The patient has  ?                          taken no previous anticoagulant or antiplatelet  ?                          agents. ASA Grade Assessment: III - A patient with  ?                          severe systemic disease. After reviewing the risks  ?  and benefits, the patient was deemed in  ?                          satisfactory condition to undergo the procedure. ?                          After obtaining informed consent, the endoscope was  ?                          passed under direct vision. Throughout the  ?                          procedure, the patient's blood pressure, pulse, and  ?                           oxygen saturations were monitored continuously. The  ?                          GIF-H190 ([id number]) scope was introduced through the  ?                          mouth, and advanced to the second part of duodenum.  ?                          The upper GI endoscopy was accomplished without  ?                          difficulty. The patient tolerated the procedure  ?                          well. The GIF-H190 ([id number]) scope was introduced  ?                          through the mouth, and advanced to the second part  ?                          of duodenum. ?Scope In: 11:40:33 AM ?Scope Out: 11:47:37 AM ?Total Procedure Duration: 0 hours 7 minutes 4 seconds  ?Findings: ?     4 cm broad "tongue" of salmon-colored epithelium coming up from the EG  ?     junction (42 cm) 2 proximal edge (37 cm). No nodularity. No esophagitis.  ?     No tumor. GE junction easily traversed. ?     Scattered gastric erosions. No ulcer or infiltrating process. Patent  ?     pylorus. Normal-appearing first and second portion of the duodenum.  ?     Normal-appearing ampulla on the medial wall of the second portion of the  ?     duodenum ?     Biopsies of the abnormal distal esophagus and gastric mucosal biopsies  ?     taken. ?Impression:               - Abnormal distal esophagus???status post biopsy.  ?  Gastric erosions.. Status post gastric biopsy ?Moderate Sedation: ?     Moderate (conscious) sedation was personally administered by an  ?     anesthesia professional. The following parameters were monitored: oxygen  ?     saturation, heart rate, blood pressure, respiratory rate, EKG, adequacy  ?     of pulmonary ventilation, and response to care. ?Recommendation:           -- Continue present medications. Follow-up on  ?                          pathology. See colonoscopy report. ?Procedure Code(s):        --- Professional --- ?                          [phone], Esophagogastroduodenoscopy, flexible,  ?                           transoral; diagnostic, including collection of  ?                          specimen(s) by brushing or washing, when performed  ?                          (separate procedure) ?Diagnosis Code(s):        --- Professional --- ?                          R10.13, Epigastric pain ?CPT copyright 2019 American Medical Association. All rights reserved. ?The codes documented in this report are preliminary and upon coder review may  ?be revised to meet current compliance requirements. ?Cristopher Estimable. Sylena Lotter, MD ?Norvel Richards, MD ?01/25/2022 12:20:04 PM ?This report has been signed electronically. ?Number of Addenda: 0 ?

## 2022-01-25 NOTE — Discharge Instructions (Signed)
?Colonoscopy ?Discharge Instructions ? ?Read the instructions outlined below and refer to this sheet in the next few weeks. These discharge instructions provide you with general information on caring for yourself after you leave the hospital. Your doctor may also give you specific instructions. While your treatment has been planned according to the most current medical practices available, unavoidable complications occasionally occur. If you have any problems or questions after discharge, call Dr. Gala Romney at [phone]. ?ACTIVITY ?You may resume your regular activity, but move at a slower pace for the next 24 hours.  ?Take frequent rest periods for the next 24 hours.  ?Walking will help get rid of the air and reduce the bloated feeling in your belly (abdomen).  ?No driving for 24 hours (because of the medicine (anesthesia) used during the test).   ?Do not sign any important legal documents or operate any machinery for 24 hours (because of the anesthesia used during the test).  ?NUTRITION ?Drink plenty of fluids.  ?You may resume your normal diet as instructed by your doctor.  ?Begin with a light meal and progress to your normal diet. Heavy or fried foods are harder to digest and may make you feel sick to your stomach (nauseated).  ?Avoid alcoholic beverages for 24 hours or as instructed.  ?MEDICATIONS ?You may resume your normal medications unless your doctor tells you otherwise.  ?WHAT YOU CAN EXPECT TODAY ?Some feelings of bloating in the abdomen.  ?Passage of more gas than usual.  ?Spotting of blood in your stool or on the toilet paper.  ?IF YOU HAD POLYPS REMOVED DURING THE COLONOSCOPY: ?No aspirin products for 7 days or as instructed.  ?No alcohol for 7 days or as instructed.  ?Eat a soft diet for the next 24 hours.  ?FINDING OUT THE RESULTS OF YOUR TEST ?Not all test results are available during your visit. If your test results are not back during the visit, make an appointment with your caregiver to find out the  results. Do not assume everything is normal if you have not heard from your caregiver or the medical facility. It is important for you to follow up on all of your test results.  ?SEEK IMMEDIATE MEDICAL ATTENTION IF: ?You have more than a spotting of blood in your stool.  ?Your belly is swollen (abdominal distention).  ?You are nauseated or vomiting.  ?You have a temperature over 101.  ?You have abdominal pain or discomfort that is severe or gets worse throughout the day.   ?EGD ?Discharge instructions ?Please read the instructions outlined below and refer to this sheet in the next few weeks. These discharge instructions provide you with general information on caring for yourself after you leave the hospital. Your doctor may also give you specific instructions. While your treatment has been planned according to the most current medical practices available, unavoidable complications occasionally occur. If you have any problems or questions after discharge, please call your doctor. ?ACTIVITY ?You may resume your regular activity but move at a slower pace for the next 24 hours.  ?Take frequent rest periods for the next 24 hours.  ?Walking will help expel (get rid of) the air and reduce the bloated feeling in your abdomen.  ?No driving for 24 hours (because of the anesthesia (medicine) used during the test).  ?You may shower.  ?Do not sign any important legal documents or operate any machinery for 24 hours (because of the anesthesia used during the test).  ?NUTRITION ?Drink plenty of fluids.  ?You may  resume your normal diet.  ?Begin with a light meal and progress to your normal diet.  ?Avoid alcoholic beverages for 24 hours or as instructed by your caregiver.  ?MEDICATIONS ?You may resume your normal medications unless your caregiver tells you otherwise.  ?WHAT YOU CAN EXPECT TODAY ?You may experience abdominal discomfort such as a feeling of fullness or ?gas? pains.  ?FOLLOW-UP ?Your doctor will discuss the results of  your test with you.  ?SEEK IMMEDIATE MEDICAL ATTENTION IF ANY OF THE FOLLOWING OCCUR: ?Excessive nausea (feeling sick to your stomach) and/or vomiting.  ?Severe abdominal pain and distention (swelling).  ?Trouble swallowing.  ?Temperature over 101? F (37.8? C).  ?Rectal bleeding or vomiting of blood.   ? ? ? ?Your stomach and esophagus were inflamed.  Biopsies taken. ? ?  3 polyps removed in your : ? ? [place]an polyp and diverticulosis information provided ? ? take your second dose of amoxicillin following the procedure as directed today ? ? further recommendations to follow pending review of pathology report ? ? office visit with [place] in 3 months ? ?At patient request, I called Nena Jordan at [phone] findings and recommendations ?

## 2022-01-25 NOTE — Anesthesia Procedure Notes (Signed)
Date/Time: 01/25/2022 11:33 AM ?Performed by: Vista Deck, CRNA ?Pre-anesthesia Checklist: Patient identified, Emergency Drugs available, Suction available, Timeout performed and Patient being monitored ?Patient Re-evaluated:Patient Re-evaluated prior to induction ?Oxygen Delivery Method: Nasal Cannula ? ? ? ? ?

## 2022-01-25 NOTE — H&P (Addendum)
$'@LOGO's$ @ ? ? ?Primary Care Physician:  Susy Frizzle, MD ?Primary Gastroenterologist:  Dr. Gala Romney ? ?Pre-Procedure History & Physical: ?HPI:  Bradley Black is a 76 y.o. male here for  further evaluation of iron deficiency anemia via EGD and colonoscopy.  History of multiple associated core morbidities as chronicled  in the database.  Was seen in our office on 12/08/2021. ?  Please note, patient previously on hemodialysis now on CAPD. ? ?Past Medical History:  ?Diagnosis Date  ? BPH (benign prostatic hyperplasia)   ? CAD S/P percutaneous coronary angioplasty   ? a. PTCA of [place] b. PCI with BMS to LAD in 1997 c. RCA PCI [place] d. s/p CABG in 11/2011 with LIMA-LAD, SVG-PDA, SVG-OM2, and SVG-D1  ? Cataract   ? Chronic back pain   ? CKD (chronic kidney disease), stage III ([place])   ? Cyst of bursa   ? R shoulder  ? Diabetic retinopathy ([place])   ? DM (diabetes mellitus), type 2 with renal complications ([place])   ? Essential hypertension   ? Frequent PVCs   ? GERD (gastroesophageal reflux disease)   ? Hypertensive retinopathy   ? Ischemic cardiomyopathy 11/2011  ? Intra-OP TEE: EF 40-45%, no regional WMA; improved Anterior WM post CABG.  ? Left carotid artery stenosis   ? Mixed hyperlipidemia   ? 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 LIMA to LAD, SVG to D1, SVG to OM2, SVG to PDA, EVH via right thigh and leg  ? ? ?Past Surgical History:  ?Procedure Laterality Date  ? AV FISTULA PLACEMENT Left 07/28/2020  ? Procedure: LEFT ARM ARTERIOVENOUS (AV) FISTULA  CREATION;  Surgeon: Rosetta Posner, MD;  Location: [facility];  Service: Vascular;  Laterality: Left;  ? BACK SURGERY  1970  ? CARDIAC CATHETERIZATION  2013  ? CORONARY ANGIOPLASTY  1994  ? OM  ? CORONARY ANGIOPLASTY  1997  ? LAD  ? CORONARY ANGIOPLASTY WITH STENT PLACEMENT  1999  ? RCA  ? CORONARY ANGIOPLASTY WITH STENT PLACEMENT  2001  ? RCA  ? CORONARY ARTERY BYPASS GRAFT  12/27/2011  ? Procedure: CORONARY ARTERY BYPASS GRAFTING (CABG);  Surgeon: Rexene Alberts, MD;   Location: [facility];  Service: Open Heart Surgery;  Laterality: N/A;  Times four. On pump. Using endoscopically harvested right greater saphenous vein and left internal mammary artery.   ? HERNIA REPAIR    ? INCISION AND DRAINAGE OF WOUND  2006  ? axilla  ? INSERTION OF DIALYSIS CATHETER Right 07/25/2020  ? Procedure: INSERTION OF DIALYSIS CATHETER;  Surgeon: Virl Cagey, MD;  Location: [facility];  Service: General;  Laterality: Right;  ? INTRAOPERATIVE TRANSESOPHAGEAL ECHOCARDIOGRAM  12/27/2011  ? Global hypokinesis with EF of 40-45%, improved LAD distribution wall motion.  ? LEFT HEART CATHETERIZATION WITH CORONARY ANGIOGRAM N/A 12/13/2011  ? Procedure: LEFT HEART CATHETERIZATION WITH CORONARY ANGIOGRAM;  Surgeon: Leonie Man, MD;  Location: [facility] CATH LAB;  Service: Cardiovascular;  Laterality: N/A;  ? LUMBAR FUSION    ? MASS EXCISION  10/24/11  ? R arm  ? RIGHT HEART CATH N/A 07/12/2020  ? Procedure: RIGHT HEART CATH;  Surgeon: Belva Crome, MD;  Location: [place] CV LAB;  Service: Cardiovascular;  Laterality: N/A;  ? TRANSESOPHAGEAL ECHOCARDIOGRAM  2013  ? ? ?Prior to Admission medications   ?Medication Sig Start Date End Date Taking? Authorizing Provider  ?aspirin EC 81 MG tablet Take 81 mg by mouth daily.  Yes [provider]  ?Cholecalciferol (VITAMIN D3) 25 MCG (1000 UT) CAPS Take 1 capsule (1,000 Units total) by mouth daily at 6 (six) AM. 05/04/21  Yes Verta Ellen., NP  ?desvenlafaxine (PRISTIQ) 25 MG 24 hr tablet TAKE ONE TABLET BY MOUTH DAILY. 10/31/21  Yes Susy Frizzle, MD  ?diltiazem (CARDIZEM CD) 120 MG 24 hr capsule Take 1 capsule (120 mg total) by mouth daily. 11/30/21  Yes BranchAlphonse Guild, MD  ?doxazosin (CARDURA) 8 MG tablet Take 8 mg by mouth daily. 04/25/21  Yes [provider]  ?ENTRESTO 97-103 MG TAKE ONE TABLET BY MOUTH TWICE A DAY 11/14/21  Yes Branch, Alphonse Guild, MD  ?hydrALAZINE (APRESOLINE) 50 MG tablet Take 50 mg by mouth 3 (three) times daily. 10/16/21   Yes [provider]  ?levothyroxine (SYNTHROID) 50 MCG tablet Take 1 tablet (50 mcg total) by mouth daily before breakfast. 10/23/21  Yes Susy Frizzle, MD  ?multivitamin (RENA-VIT) TABS tablet Take 1 tablet by mouth daily. 01/30/21  Yes [provider]  ?pantoprazole (PROTONIX) 40 MG tablet TAKE ONE TABLET BY MOUTH TWICE A DAY 01/23/22  Yes Susy Frizzle, MD  ?pravastatin (PRAVACHOL) 20 MG tablet TAKE ONE TABLET ('20MG'$  TOTAL) BY MOUTH DAILY 12/26/21  Yes Branch, Alphonse Guild, MD  ?torsemide (DEMADEX) 20 MG tablet Take 20 mg by mouth daily as needed (swelling). 03/29/21  Yes [provider]  ? ? ?Allergies as of 12/11/2021 - Review Complete 12/08/2021  ?Allergen Reaction Noted  ? No known allergies  09/05/2020  ? ? ?Family History  ?Problem Relation Age of Onset  ? Cancer Mother   ?     breast  ? Cancer Father   ?     bone  ? Dujuan cancer Neg Hx   ? ? ?Social History  ? ?Socioeconomic History  ? Marital status: Married  ?  Spouse name: [name]  ? Number of children: 1  ? Years of education: college  ? Highest education level: Bachelor's degree (e.g., BA, AB, BS)  ?Occupational History  ? 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  ?Tobacco Use  ? Smoking status: Former  ?  Types: Cigarettes  ?  Quit date: 10/01/1986  ?  Years since quitting: 35.3  ? Smokeless tobacco: Never  ? Tobacco comments:  ?  quit about 30 yrs ago  ?Vaping Use  ? Vaping Use: Never used  ?Substance and Sexual Activity  ? Alcohol use: No  ? Drug use: No  ? Sexual activity: Not Currently  ?Other Topics Concern  ? Not on file  ?Social History Narrative  ? Married,  father of one,  grandfather 1. Works out at [employer] at [employer] roughly 2-3 days a week. He works on a treadmill. He does note having a hard time getting his heart rate up.  ? 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  ? He quit smoking in 1988, and does not drink alcohol.  ? ?Social Determinants of Health  ? ?Financial Resource Strain: Low Risk   ?  Difficulty of Paying Living Expenses: Not hard at all  ?Food Insecurity: No Food Insecurity  ? Worried About Charity fundraiser in the Last Year: Never true  ? Ran Out of Food in the Last Year: Never true  ?Transportation Needs: No Transportation Needs  ? Lack of Transportation (Medical): No  ? Lack of Transportation (Non-Medical): No  ?Physical Activity: Insufficiently Active  ? Days of Exercise per  Week: 5 days  ? Minutes of Exercise per Session: 20 min  ?Stress: No Stress Concern Present  ? Feeling of Stress : Not at all  ?Social Connections: Socially Integrated  ? Frequency of Communication with Friends and Family: More than three times a week  ? Frequency of Social Gatherings with Friends and Family: More than three times a week  ? Attends Religious Services: 1 to 4 times per year  ? Active Member of Clubs or Organizations: No  ? Attends Archivist Meetings: 1 to 4 times per year  ? Marital Status: Married  ?Intimate Partner Violence: Not At Risk  ? Fear of Current or Ex-Partner: No  ? Emotionally Abused: No  ? Physically Abused: No  ? Sexually Abused: No  ? ? ?Review of Systems: ?See HPI, otherwise negative ROS ? ?Physical Exam: ?BP (!) 144/63   Pulse 78   Temp 98.1 ?F (36.7 ?C) (Oral)   Resp 17   Ht '5\' 8"'$  (1.727 m)   Wt 63.5 kg   SpO2 99%   BMI 21.29 kg/m?  ?General:   Alert,  Well-developed, well-nourished, pleasant and cooperative in NAD ?Skin:  Intact without significant lesions or rashes. ?ENeck:  Supple; no masses or thyromegaly. No significant cervical adenopathy. ?Lungs:  Clear throughout to auscultation.   No wheezes, crackles, or rhonchi. No acute distress. ?Heart:  Regular rate and rhythm; no murmurs, clicks, rubs,  or gallops. ?Abdomen: Non-distended, normal bowel sounds.  Soft and nontender without appreciable mass or hepatosplenomegaly.  ?Pulses:  Normal pulses noted. ?Extremities:  Without clubbing or edema. ? ?Impression/Plan:    76 year old gentleman multiple comorbidities  referred for evaluation of iron deficiency anemia.  Only GI symptoms GERD and that is well controlled on twice daily PPI therapy. ? ?I have offered the patientwith a diagnostic EGD and colonoscopy per plan. ?The r

## 2022-01-25 NOTE — Anesthesia Preprocedure Evaluation (Addendum)
Anesthesia Evaluation  ?Patient identified by MRN, date of birth, ID band ?Patient awake ? ? ? ?Reviewed: ?Allergy & Precautions, NPO status , Patient's Chart, lab work & pertinent test results ? ?Airway ?Mallampati: II ? ?TM Distance: >3 FB ?Neck ROM: Full ? ? ? Dental ? ?(+) Dental Advisory Given, Teeth Intact ?  ?Pulmonary ?former smoker,  ?  ?Pulmonary exam normal ?breath sounds clear to auscultation ? ? ? ? ? ? Cardiovascular ?Exercise Tolerance: Good ?hypertension, Pt. on medications ?+ CAD, + Past MI, + Cardiac Stents and + CABG  ?Normal cardiovascular exam+ Valvular Problems/Murmurs  ?Rhythm:Regular Rate:Normal ? ? ?  ?Neuro/Psych ?  ? GI/Hepatic ?GERD  Medicated and Controlled,  ?Endo/Other  ?diabetes, Well Controlled, Type 2, Oral Hypoglycemic Agents ? Renal/GU ?Renal Insufficiency and DialysisRenal disease  ? ?  ?Musculoskeletal ? ? Abdominal ?  ?Peds ? Hematology ? ?(+) Blood dyscrasia, anemia ,   ?Anesthesia Other Findings ? ? Reproductive/Obstetrics ? ?  ? ? ? ? ? ? ? ? ? ? ? ? ? ?  ?  ? ? ? ?Anesthesia Physical ?Anesthesia Plan ? ?ASA: 3 ? ?Anesthesia Plan: General  ? ?Post-op Pain Management: Minimal or no pain anticipated  ? ?Induction: Intravenous ? ?PONV Risk Score and Plan: Propofol infusion ? ?Airway Management Planned: Nasal Cannula and Natural Airway ? ?Additional Equipment:  ? ?Intra-op Plan:  ? ?Post-operative Plan:  ? ?Informed Consent: I have reviewed the patients History and Physical, chart, labs and discussed the procedure including the risks, benefits and alternatives for the proposed anesthesia with the patient or authorized representative who has indicated his/her understanding and acceptance.  ? ? ? ?Dental advisory given ? ?Plan Discussed with: CRNA and Surgeon ? ?Anesthesia Plan Comments:   ? ? ? ? ?Anesthesia Quick Evaluation ? ?

## 2022-01-25 NOTE — Transfer of Care (Signed)
Immediate Anesthesia Transfer of Care Note ? ?Patient: Bradley Black ? ?Procedure(s) Performed: COLONOSCOPY WITH PROPOFOL ?ESOPHAGOGASTRODUODENOSCOPY (EGD) WITH PROPOFOL ?BIOPSY ?POLYPECTOMY ? ?Patient Location: Short Stay ? ?Anesthesia Type:General ? ?Level of Consciousness: awake and patient cooperative ? ?Airway & Oxygen Therapy: Patient Spontanous Breathing ? ?Post-op Assessment: Report given to RN and Post -op Vital signs reviewed and stable ? ?Post vital signs: Reviewed and stable ? ?Last Vitals:  ?Vitals Value Taken Time  ?BP 99/46 01/25/22 1223  ?Temp 36.5 ?C 01/25/22 1223  ?Pulse 53 1223  ?Resp 19 01/25/22 1223  ?SpO2 98 % 01/25/22 1223  ? ? ?Last Pain:  ?Vitals:  ? 01/25/22 1223  ?TempSrc: Oral  ?PainSc: 0-No pain  ?   ? ?  ? ?Complications: No notable events documented. ?

## 2022-01-25 NOTE — OR Nursing (Signed)
Called and spoke to Nurse Caleb Popp at [facility] to confirm Mr. Bradley Black received antibiotics to take prior to procedure on 01/25/2022. Dr. Lavonia Dana was said to be prescribing provider. Patient was prescribed Amoxicillin two doses one to take night before procedure and one to take the night after the procedure. Patient reports he had taken the first dose the night before his procedure and understood to take the second dose after procedure.  ?

## 2022-01-25 NOTE — Op Note (Addendum)
[facility] ?Patient Name: Bradley Black ?Procedure Date: 01/25/2022 11:50 AM ?MRN: [id number] ?Date of Birth: 03/31/1946 ?Attending MD: Norvel Richards , MD ?CSN: [id number] ?Age: 76 ?Admit Type: Outpatient ?Procedure:                Colonoscopy ?Indications:              Iron deficiency anemia ?Providers:                Norvel Richards, MD, Lambert Mody,  ?                          Kristine L. Risa Grill, Technician ?Referring MD:              ?Medicines:                Propofol per Anesthesia; amoxicillin 250 mg p.o.  ?                          pre and post procedure. ?Complications:            No immediate complications. ?Estimated Blood Loss:     Estimated blood loss was minimal. ?Procedure:                Pre-Anesthesia Assessment: ?                          - Prior to the procedure, a History and Physical  ?                          was performed, and patient medications and  ?                          allergies were reviewed. The patient's tolerance of  ?                          previous anesthesia was also reviewed. The risks  ?                          and benefits of the procedure and the sedation  ?                          options and risks were discussed with the patient.  ?                          All questions were answered, and informed consent  ?                          was obtained. Prior Anticoagulants: The patient has  ?                          taken no previous anticoagulant or antiplatelet  ?                          agents. ASA Grade Assessment: III - A patient with  ?  severe systemic disease. After reviewing the risks  ?                          and benefits, the patient was deemed in  ?                          satisfactory condition to undergo the procedure. ?                          After obtaining informed consent, the colonoscope  ?                          was passed under direct vision. Throughout the  ?                           procedure, the patient's blood pressure, pulse, and  ?                          oxygen saturations were monitored continuously. The  ?                          [id number]) scope was introduced through the  ?                          anus and advanced to the the terminal ileum. The  ?                          colonoscopy was performed without difficulty. The  ?                          patient tolerated the procedure well. The quality  ?                          of the bowel preparation was adequate. ?Scope In: 11:52:20 AM ?Scope Out: 78:24:23 PM ?Scope Withdrawal Time: 0 hours 13 minutes 7 seconds  ?Total Procedure Duration: 0 hours 23 minutes 58 seconds  ?Findings: ?     The perianal and digital rectal examinations were normal. Redundant and  ?     elongated Zade. External abdominal pressure required to reach the cecum. ?     Three semi-pedunculated polyps were found in the descending Kylie,  ?     transverse Aundre, mid transverse Maysin and ascending Mandell. The polyps  ?     were 4 to 8 mm in size. These polyps were removed with a cold snare.  ?     Resection and retrieval were complete. Estimated blood loss was minimal. ?     Scattered small and large-mouthed diverticula were found in the sigmoid  ?     Ory and descending Syed. ?     The exam was otherwise without abnormality on direct and retroflexion  ?     views. ?Impression:               - Three 4 to 8 mm polyps in the descending Keyan,  ?  in the transverse Barron, in the mid transverse  ?                          Zayan and in the ascending Aum, removed with a  ?                          cold snare. Resected and retrieved. ?                          - Diverticulosis in the sigmoid Heaton and in the  ?                          descending Homero. Redundant Hassell. ?                          - The examination was otherwise normal on direct  ?                          and retroflexion views. ?Moderate Sedation: ?     Moderate  (conscious) sedation was personally administered by an  ?     anesthesia professional. The following parameters were monitored: oxygen  ?     saturation, heart rate, blood pressure, and response to care. ?Recommendation:           - Patient has a contact number available for  ?                          emergencies. The signs and symptoms of potential  ?                          delayed complications were discussed with the  ?                          patient. Return to normal activities tomorrow.  ?                          Written discharge instructions were provided to the  ?                          patient. ?                          - Resume previous diet. ?                          - Continue present medications. ?                          - Repeat colonoscopy date to be determined after  ?                          pending pathology results are reviewed for  ?                          surveillance. ?                          -  Return to GI office in 3 months. See EGD report. ?Procedure Code(s):        --- Professional --- ?                          [phone], Colonoscopy, flexible; with removal of  ?                          tumor(s), polyp(s), or other lesion(s) by snare  ?                          technique ?Diagnosis Code(s):        --- Professional --- ?                          K63.5, Polyp of Jatavian ?                          D50.9, Iron deficiency anemia, unspecified ?                          K57.30, Diverticulosis of large intestine without  ?                          perforation or abscess without bleeding ?CPT copyright 2019 American Medical Association. All rights reserved. ?The codes documented in this report are preliminary and upon coder review may  ?be revised to meet current compliance requirements. ?Cristopher Estimable. Catherine Cubero, MD ?Norvel Richards, MD ?01/25/2022 12:23:19 PM ?This report has been signed electronically. ?Number of Addenda: 0 ?

## 2022-01-25 NOTE — Anesthesia Postprocedure Evaluation (Signed)
Anesthesia Post Note ? ?Patient: Mumin Michiel Cowboy ? ?Procedure(s) Performed: COLONOSCOPY WITH PROPOFOL ?ESOPHAGOGASTRODUODENOSCOPY (EGD) WITH PROPOFOL ?BIOPSY ?POLYPECTOMY ? ?Patient location during evaluation: Phase II ?Anesthesia Type: General ?Level of consciousness: awake and alert and oriented ?Pain management: pain level controlled ?Vital Signs Assessment: post-procedure vital signs reviewed and stable ?Respiratory status: spontaneous breathing, nonlabored ventilation and respiratory function stable ?Cardiovascular status: blood pressure returned to baseline and stable ?Postop Assessment: no apparent nausea or vomiting ?Anesthetic complications: no ? ? ?No notable events documented. ? ? ?Last Vitals:  ?Vitals:  ? 01/25/22 0935 01/25/22 1223  ?BP: (!) 144/63 (!) 99/46  ?Pulse: 78   ?Resp: 17 19  ?Temp: 36.7 ?C 36.5 ?C  ?SpO2: 99% 98%  ?  ?Last Pain:  ?Vitals:  ? 01/25/22 1223  ?TempSrc: Oral  ?PainSc: 0-No pain  ? ? ?  ?  ?  ?  ?  ?  ? ?Doylene Splinter C Airon Sahni ? ? ? ? ?

## 2022-01-26 DIAGNOSIS — Z992 Dependence on renal dialysis: Secondary | ICD-10-CM | POA: Diagnosis not present

## 2022-01-26 DIAGNOSIS — N186 End stage renal disease: Secondary | ICD-10-CM | POA: Diagnosis not present

## 2022-01-27 DIAGNOSIS — N186 End stage renal disease: Secondary | ICD-10-CM | POA: Diagnosis not present

## 2022-01-27 DIAGNOSIS — Z992 Dependence on renal dialysis: Secondary | ICD-10-CM | POA: Diagnosis not present

## 2022-01-28 DIAGNOSIS — Z992 Dependence on renal dialysis: Secondary | ICD-10-CM | POA: Diagnosis not present

## 2022-01-28 DIAGNOSIS — N186 End stage renal disease: Secondary | ICD-10-CM | POA: Diagnosis not present

## 2022-01-29 DIAGNOSIS — Z992 Dependence on renal dialysis: Secondary | ICD-10-CM | POA: Diagnosis not present

## 2022-01-29 DIAGNOSIS — Z23 Encounter for immunization: Secondary | ICD-10-CM | POA: Diagnosis not present

## 2022-01-29 DIAGNOSIS — N186 End stage renal disease: Secondary | ICD-10-CM | POA: Diagnosis not present

## 2022-01-29 LAB — POCT I-STAT, CHEM 8
BUN: 59 mg/dL — ABNORMAL HIGH (ref 8–23)
Calcium, Ion: 1.12 mmol/L — ABNORMAL LOW (ref 1.15–1.40)
Chloride: 115 mmol/L — ABNORMAL HIGH (ref 98–111)
Creatinine, Ser: 4.2 mg/dL — ABNORMAL HIGH (ref 0.61–1.24)
Glucose, Bld: 111 mg/dL — ABNORMAL HIGH (ref 70–99)
HCT: 39 % (ref 39.0–52.0)
Hemoglobin: 13.3 g/dL (ref 13.0–17.0)
Potassium: 4.9 mmol/L (ref 3.5–5.1)
Sodium: 139 mmol/L (ref 135–145)
TCO2: 19 mmol/L — ABNORMAL LOW (ref 22–32)

## 2022-01-29 LAB — SURGICAL PATHOLOGY

## 2022-01-30 ENCOUNTER — Encounter (HOSPITAL_COMMUNITY): Payer: Self-pay | Admitting: Internal Medicine

## 2022-01-30 ENCOUNTER — Encounter: Payer: Self-pay | Admitting: Internal Medicine

## 2022-01-30 DIAGNOSIS — Z992 Dependence on renal dialysis: Secondary | ICD-10-CM | POA: Diagnosis not present

## 2022-01-30 DIAGNOSIS — N186 End stage renal disease: Secondary | ICD-10-CM | POA: Diagnosis not present

## 2022-01-30 DIAGNOSIS — Z23 Encounter for immunization: Secondary | ICD-10-CM | POA: Diagnosis not present

## 2022-01-31 DIAGNOSIS — Z23 Encounter for immunization: Secondary | ICD-10-CM | POA: Diagnosis not present

## 2022-01-31 DIAGNOSIS — Z992 Dependence on renal dialysis: Secondary | ICD-10-CM | POA: Diagnosis not present

## 2022-01-31 DIAGNOSIS — N186 End stage renal disease: Secondary | ICD-10-CM | POA: Diagnosis not present

## 2022-02-01 ENCOUNTER — Other Ambulatory Visit: Payer: Self-pay | Admitting: Cardiology

## 2022-02-01 ENCOUNTER — Other Ambulatory Visit: Payer: Self-pay | Admitting: Family Medicine

## 2022-02-01 DIAGNOSIS — Z23 Encounter for immunization: Secondary | ICD-10-CM | POA: Diagnosis not present

## 2022-02-01 DIAGNOSIS — N186 End stage renal disease: Secondary | ICD-10-CM | POA: Diagnosis not present

## 2022-02-01 DIAGNOSIS — Z992 Dependence on renal dialysis: Secondary | ICD-10-CM | POA: Diagnosis not present

## 2022-02-01 NOTE — Telephone Encounter (Signed)
Requested medication (s) are due for refill today:   Yes ? ?Requested medication (s) are on the active medication list:   Yes ? ?Future visit scheduled:   No ? ? ?Last ordered: 10/31/2021 #90, 0 refills ? ?Returned because Lipid panel over due per protocol  ? ?Requested Prescriptions  ?Pending Prescriptions Disp Refills  ? desvenlafaxine (PRISTIQ) 25 MG 24 hr tablet [Pharmacy Med Name: DESVENLAFAXINE SUCCNT ER 25 MG] 90 tablet 0  ?  Sig: TAKE ONE TABLET BY MOUTH DAILY.  ?  ? Psychiatry: Antidepressants - SNRI - desvenlafaxine & venlafaxine Failed - 02/01/2022 10:25 AM  ?  ?  Failed - Cr in normal range and within 360 days  ?  Creat  ?Date Value Ref Range Status  ?05/01/2019 2.84 (H) 0.70 - 1.18 mg/dL Final  ?  Comment:  ?  For patients >52 years of age, the reference limit ?for Creatinine is approximately 13% higher for people ?identified as African-American. ?. ?  ? ?Creatinine, Ser  ?Date Value Ref Range Status  ?01/25/2022 4.20 (H) 0.61 - 1.24 mg/dL Final  ? ?Creatinine, Urine  ?Date Value Ref Range Status  ?07/09/2020 34.25 mg/dL Final  ?  Comment:  ?  Performed at [facility], [address], [place], [place] [address]  ?  ?  ?  ?  Failed - Last BP in normal range  ?  BP Readings from Last 1 Encounters:  ?01/25/22 (!) 99/46  ?  ?  ?  ?  Failed - Lipid Panel in normal range within the last 12 months  ?  Cholesterol  ?Date Value Ref Range Status  ?07/13/2020 104 0 - 200 mg/dL Final  ? ?LDL Cholesterol (Calc)  ?Date Value Ref Range Status  ?05/01/2019 73 mg/dL (calc) Final  ?  Comment:  ?  Reference range: <100 ?[name] ?Desirable range <100 mg/dL for primary prevention;   ?<70 mg/dL for patients with CHD or diabetic patients  ?with > or = 2 CHD risk factors. ?. ?LDL-C is now calculated using the Martin-Hopkins  ?calculation, which is a validated novel method providing  ?better accuracy than the Friedewald equation in the  ?estimation of LDL-C.  ?Cresenciano Genre et al. Annamaria Helling. 0630;160(10): 2061-2068   ?(http://education.[facility].com/faq/FAQ164) ?  ? ?LDL Cholesterol  ?Date Value Ref Range Status  ?07/13/2020 67 0 - 99 mg/dL Final  ?  Comment:  ?         ?Total Cholesterol/HDL:CHD Risk ?Coronary Heart Disease Risk Table ?                    Men   Women ? 1/2 Average Risk   3.4   3.3 ? Average Risk       5.0   4.4 ? 2 X Average Risk   9.6   7.1 ? 3 X Average Risk  23.4   11.0 ?       ?Use the calculated Patient Ratio ?above and the CHD Risk Table ?to determine the patient's CHD Risk. ?       ?ATP III CLASSIFICATION (LDL): ? <100     mg/dL   Optimal ? 100-129  mg/dL   Near or Above ?                   Optimal ? 130-159  mg/dL   Borderline ? 160-189  mg/dL   High ? >190     mg/dL   Very High ?Performed at [facility], [place]  [address], [place], [place] [address] ?  ? ?HDL  ?Date Value Ref Range Status  ?07/13/2020 25 (L) >40 mg/dL Final  ? ?Triglycerides  ?Date Value Ref Range Status  ?07/13/2020 62 <150 mg/dL Final  ? ?  ?  ?  Passed - Valid encounter within last 6 months  ?  Recent Outpatient Visits   ? ?      ? 6 months ago Chest pain due to GERD  ? [facility] [place], [name], MD  ? 1 year ago Essential hypertension  ? [facility] [place], [name], MD  ? 1 year ago End stage renal disease [facility])  ? [facility] Pickard, [name], MD  ? 1 year ago Depression, recurrent [facility])  ? [facility] Pickard, [name], MD  ? 1 year ago Polyuria  ? [facility] Pickard, [name], MD  ? ?  ?  ? ? ?  ?  ?  ? ?

## 2022-02-02 DIAGNOSIS — Z23 Encounter for immunization: Secondary | ICD-10-CM | POA: Diagnosis not present

## 2022-02-02 DIAGNOSIS — Z992 Dependence on renal dialysis: Secondary | ICD-10-CM | POA: Diagnosis not present

## 2022-02-02 DIAGNOSIS — N186 End stage renal disease: Secondary | ICD-10-CM | POA: Diagnosis not present

## 2022-02-03 DIAGNOSIS — N186 End stage renal disease: Secondary | ICD-10-CM | POA: Diagnosis not present

## 2022-02-03 DIAGNOSIS — Z23 Encounter for immunization: Secondary | ICD-10-CM | POA: Diagnosis not present

## 2022-02-03 DIAGNOSIS — Z992 Dependence on renal dialysis: Secondary | ICD-10-CM | POA: Diagnosis not present

## 2022-02-04 DIAGNOSIS — Z992 Dependence on renal dialysis: Secondary | ICD-10-CM | POA: Diagnosis not present

## 2022-02-04 DIAGNOSIS — N186 End stage renal disease: Secondary | ICD-10-CM | POA: Diagnosis not present

## 2022-02-04 DIAGNOSIS — Z23 Encounter for immunization: Secondary | ICD-10-CM | POA: Diagnosis not present

## 2022-02-05 DIAGNOSIS — Z23 Encounter for immunization: Secondary | ICD-10-CM | POA: Diagnosis not present

## 2022-02-05 DIAGNOSIS — Z992 Dependence on renal dialysis: Secondary | ICD-10-CM | POA: Diagnosis not present

## 2022-02-05 DIAGNOSIS — N186 End stage renal disease: Secondary | ICD-10-CM | POA: Diagnosis not present

## 2022-02-06 DIAGNOSIS — N186 End stage renal disease: Secondary | ICD-10-CM | POA: Diagnosis not present

## 2022-02-06 DIAGNOSIS — Z23 Encounter for immunization: Secondary | ICD-10-CM | POA: Diagnosis not present

## 2022-02-06 DIAGNOSIS — M4004 Postural kyphosis, thoracic region: Secondary | ICD-10-CM | POA: Diagnosis not present

## 2022-02-06 DIAGNOSIS — Z992 Dependence on renal dialysis: Secondary | ICD-10-CM | POA: Diagnosis not present

## 2022-02-07 DIAGNOSIS — Z23 Encounter for immunization: Secondary | ICD-10-CM | POA: Diagnosis not present

## 2022-02-07 DIAGNOSIS — N186 End stage renal disease: Secondary | ICD-10-CM | POA: Diagnosis not present

## 2022-02-07 DIAGNOSIS — Z992 Dependence on renal dialysis: Secondary | ICD-10-CM | POA: Diagnosis not present

## 2022-02-08 DIAGNOSIS — N186 End stage renal disease: Secondary | ICD-10-CM | POA: Diagnosis not present

## 2022-02-08 DIAGNOSIS — Z23 Encounter for immunization: Secondary | ICD-10-CM | POA: Diagnosis not present

## 2022-02-08 DIAGNOSIS — Z992 Dependence on renal dialysis: Secondary | ICD-10-CM | POA: Diagnosis not present

## 2022-02-09 DIAGNOSIS — Z23 Encounter for immunization: Secondary | ICD-10-CM | POA: Diagnosis not present

## 2022-02-09 DIAGNOSIS — Z992 Dependence on renal dialysis: Secondary | ICD-10-CM | POA: Diagnosis not present

## 2022-02-09 DIAGNOSIS — N186 End stage renal disease: Secondary | ICD-10-CM | POA: Diagnosis not present

## 2022-02-10 DIAGNOSIS — Z23 Encounter for immunization: Secondary | ICD-10-CM | POA: Diagnosis not present

## 2022-02-10 DIAGNOSIS — N186 End stage renal disease: Secondary | ICD-10-CM | POA: Diagnosis not present

## 2022-02-10 DIAGNOSIS — Z992 Dependence on renal dialysis: Secondary | ICD-10-CM | POA: Diagnosis not present

## 2022-02-11 DIAGNOSIS — Z992 Dependence on renal dialysis: Secondary | ICD-10-CM | POA: Diagnosis not present

## 2022-02-11 DIAGNOSIS — Z23 Encounter for immunization: Secondary | ICD-10-CM | POA: Diagnosis not present

## 2022-02-11 DIAGNOSIS — N186 End stage renal disease: Secondary | ICD-10-CM | POA: Diagnosis not present

## 2022-02-12 DIAGNOSIS — Z992 Dependence on renal dialysis: Secondary | ICD-10-CM | POA: Diagnosis not present

## 2022-02-12 DIAGNOSIS — Z23 Encounter for immunization: Secondary | ICD-10-CM | POA: Diagnosis not present

## 2022-02-12 DIAGNOSIS — N186 End stage renal disease: Secondary | ICD-10-CM | POA: Diagnosis not present

## 2022-02-13 DIAGNOSIS — Z992 Dependence on renal dialysis: Secondary | ICD-10-CM | POA: Diagnosis not present

## 2022-02-13 DIAGNOSIS — N186 End stage renal disease: Secondary | ICD-10-CM | POA: Diagnosis not present

## 2022-02-13 DIAGNOSIS — Z23 Encounter for immunization: Secondary | ICD-10-CM | POA: Diagnosis not present

## 2022-02-14 DIAGNOSIS — N186 End stage renal disease: Secondary | ICD-10-CM | POA: Diagnosis not present

## 2022-02-14 DIAGNOSIS — Z23 Encounter for immunization: Secondary | ICD-10-CM | POA: Diagnosis not present

## 2022-02-14 DIAGNOSIS — Z992 Dependence on renal dialysis: Secondary | ICD-10-CM | POA: Diagnosis not present

## 2022-02-15 ENCOUNTER — Ambulatory Visit (INDEPENDENT_AMBULATORY_CARE_PROVIDER_SITE_OTHER): Payer: Medicare Other | Admitting: Family Medicine

## 2022-02-15 VITALS — BP 120/58 | HR 54 | Temp 98.0°F | Ht 68.0 in | Wt 146.2 lb

## 2022-02-15 DIAGNOSIS — E039 Hypothyroidism, unspecified: Secondary | ICD-10-CM

## 2022-02-15 DIAGNOSIS — N185 Chronic kidney disease, stage 5: Secondary | ICD-10-CM

## 2022-02-15 DIAGNOSIS — M5134 Other intervertebral disc degeneration, thoracic region: Secondary | ICD-10-CM

## 2022-02-15 DIAGNOSIS — Z992 Dependence on renal dialysis: Secondary | ICD-10-CM | POA: Diagnosis not present

## 2022-02-15 DIAGNOSIS — R251 Tremor, unspecified: Secondary | ICD-10-CM | POA: Diagnosis not present

## 2022-02-15 DIAGNOSIS — N186 End stage renal disease: Secondary | ICD-10-CM | POA: Diagnosis not present

## 2022-02-15 DIAGNOSIS — G253 Myoclonus: Secondary | ICD-10-CM | POA: Diagnosis not present

## 2022-02-15 DIAGNOSIS — Z23 Encounter for immunization: Secondary | ICD-10-CM | POA: Diagnosis not present

## 2022-02-15 MED ORDER — HYDROCODONE-ACETAMINOPHEN 5-325 MG PO TABS: 1.0000 | ORAL_TABLET | Freq: Four times a day (QID) | ORAL | 0 refills | Status: DC | PRN

## 2022-02-15 NOTE — Progress Notes (Signed)
Subjective:    Patient ID: Bradley Black, male    DOB: [birth year], 76 y.o.   MRN: [id number]  Patient is a very pleasant 76 year old Caucasian gentleman who presents today with several concerns.  First he reports fatigue.  He states that his fatigue improved when he started taking levothyroxine 50 mcg daily last year when his TSH was found to be elevated.  At that time cardiology discontinued amiodarone and started Cardizem.  He states however the fatigue has returned.  He is still taking Cardizem.  He has bradycardia with average heart rate in the 50s.  He states that his heart rate sometimes will be in the 40s.  He denies any syncope but he does report fatigue.  His blood pressures are typically well controlled.  He denies any chest pain or shortness of breath.  He would like to wean off Pristiq which I believe is appropriate.  However he is taking gabapentin for back pain.  However he is unable to take higher doses of the gabapentin due to his kidney disease.  He is questioning if I can give him something for pain such as tramadol which I will be happy to do.  Of note he has myoclonic jerking on his exam today.  This is primarily in his upper extremities.  I believe that this is due to degeneration from chronic medical conditions however I would like to get a second opinion with a neurologist.  Past Medical History:  Diagnosis Date   BPH (benign prostatic hyperplasia)    CAD S/P percutaneous coronary angioplasty    a. PTCA of [place] b. PCI with BMS to LAD in 1997 c. RCA PCI [place] d. 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   LIMA to LAD, SVG to D1, SVG to OM2, SVG to PDA, EVH via right thigh and leg   Past Surgical History:  Procedure Laterality Date   AV FISTULA PLACEMENT Left 07/28/2020   Procedure: LEFT ARM ARTERIOVENOUS (AV) FISTULA  CREATION;  Surgeon: Rosetta Posner, MD;  Location: [facility];  Service: Vascular;  Laterality: Left;   [place]   BIOPSY  01/25/2022   Procedure: BIOPSY;  Surgeon: Daneil Dolin, MD;  [facility];  Service: Endoscopy;;   CARDIAC CATHETERIZATION  2013   COLONOSCOPY WITH PROPOFOL N/A 01/25/2022   Procedure: COLONOSCOPY WITH PROPOFOL;  Surgeon: Daneil Dolin, MD;  [facility];  Service: Endoscopy;  Laterality: N/A;  10:30am   CORONARY ANGIOPLASTY  1994   OM   CORONARY ANGIOPLASTY  1997   LAD   CORONARY ANGIOPLASTY WITH STENT PLACEMENT  1999   RCA   CORONARY ANGIOPLASTY WITH STENT PLACEMENT  2001   RCA   CORONARY ARTERY BYPASS GRAFT  12/27/2011   Procedure: CORONARY ARTERY BYPASS GRAFTING (  CABG);  Surgeon: Rexene Alberts, MD;  Location: [facility];  Service: Open Heart Surgery;  Laterality: N/A;  Times four. On pump. Using endoscopically harvested right greater saphenous vein and left internal mammary artery.    ESOPHAGOGASTRODUODENOSCOPY (EGD) WITH PROPOFOL N/A 01/25/2022   Procedure: ESOPHAGOGASTRODUODENOSCOPY (EGD) WITH PROPOFOL;  Surgeon: Daneil Dolin, MD;  [facility];  Service: Endoscopy;  Laterality: N/A;   HERNIA REPAIR     INCISION AND DRAINAGE OF WOUND  2006   [place]   INSERTION OF DIALYSIS CATHETER Right 07/25/2020   Procedure: INSERTION OF DIALYSIS CATHETER;  Surgeon: Virl Cagey, MD;  Location: [facility];  Service: General;  Laterality: Right;   INTRAOPERATIVE TRANSESOPHAGEAL ECHOCARDIOGRAM  12/27/2011   Global hypokinesis with EF of  40-45%, improved LAD distribution wall motion.   LEFT HEART CATHETERIZATION WITH CORONARY ANGIOGRAM N/A 12/13/2011   Procedure: LEFT HEART CATHETERIZATION WITH CORONARY ANGIOGRAM;  Surgeon: Leonie Man, MD;  Location: [facility];  Service: Cardiovascular;  Laterality: N/A;   LUMBAR FUSION     MASS EXCISION  10/24/11   R arm   POLYPECTOMY  01/25/2022   Procedure: POLYPECTOMY;  Surgeon: Daneil Dolin, MD;  [facility];  Service: Endoscopy;;   RIGHT HEART CATH N/A 07/12/2020   Procedure: RIGHT HEART CATH;  Surgeon: Belva Crome, MD;  Location: [facility];  Service: Cardiovascular;  Laterality: N/A;   TRANSESOPHAGEAL ECHOCARDIOGRAM  2013   Current Outpatient Medications on File Prior to Visit  Medication Sig Dispense Refill   aspirin EC 81 MG tablet Take 81 mg by mouth daily.     Cholecalciferol (VITAMIN D3) 25 MCG (1000 UT) CAPS Take 1 capsule (1,000 Units total) by mouth daily at 6 (six) AM. 60 capsule 6   desvenlafaxine (PRISTIQ) 25 MG 24 hr tablet TAKE ONE TABLET BY MOUTH DAILY. 90 tablet 0   diltiazem (CARDIZEM CD) 120 MG 24 hr capsule TAKE ONE CAPSULE ('120MG'$  TOTAL) BY MOUTH DAILY 30 capsule 1   doxazosin (CARDURA) 8 MG tablet Take 8 mg by mouth daily.     ENTRESTO 97-103 MG TAKE ONE TABLET BY MOUTH TWICE A DAY 60 tablet 6   hydrALAZINE (APRESOLINE) 50 MG tablet Take 50 mg by mouth 3 (three) times daily.     levothyroxine (SYNTHROID) 50 MCG tablet Take 1 tablet (50 mcg total) by mouth daily before breakfast. 90 tablet 3   multivitamin (RENA-VIT) TABS tablet Take 1 tablet by mouth daily.     pantoprazole (PROTONIX) 40 MG tablet TAKE ONE TABLET BY MOUTH TWICE A DAY 180 tablet 3   pravastatin (PRAVACHOL) 20 MG tablet TAKE ONE TABLET ('20MG'$  TOTAL) BY MOUTH DAILY 90 tablet 3   torsemide (DEMADEX) 20 MG tablet Take 20 mg by mouth daily as needed (swelling).     No current facility-administered medications on file prior to visit.   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  Tobacco Use   Smoking status: Former    Types: Cigarettes    Quit date: 10/01/1986    Years since quitting: 35.4   Smokeless tobacco: Never   Tobacco comments:    quit about 30 yrs ago  Vaping  Use   Vaping Use: Never used  Substance and Sexual Activity   Alcohol use: No   Drug use: No   Sexual activity: Not Currently  Other Topics Concern   Not on file  Social History Narrative   Married,  father of one,  grandfather 58. Works out at [employer] at [employer] roughly 2-3 days a week. He works on a treadmill. He does note having a hard time getting his heart rate up.   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   He quit smoking in 1988, and does not drink alcohol.   Social Determinants of Health   Financial Resource Strain: Low Risk    Difficulty of Paying Living Expenses: Not hard at all  Food Insecurity: No Food Insecurity   Worried About Charity fundraiser in the Last Year: Never true   [place] in the Last Year: Never true  Transportation Needs: No Transportation Needs   Lack of Transportation (Medical): No   Lack of Transportation (Non-Medical): No  Physical Activity: Insufficiently Active   Days of Exercise per Week: 5 days   Minutes of Exercise per Session: 20 min  Stress: No Stress Concern Present   Feeling of Stress : Not at all  Social Connections: Socially Integrated   Frequency of Communication with Friends and Family: More than three times a week   Frequency of Social Gatherings with Friends and Family: More than three times a week   Attends Religious Services: 1 to 4 times per year   Active Member of [employer] or Organizations: No   Attends Music therapist: 1 to 4 times per  year   Marital Status: Married  Human resources officer Violence: Not At Risk   Fear of Current or Ex-Partner: No   Emotionally Abused: No   Physically Abused: No   Sexually Abused: No      Review of Systems  All other systems reviewed and are negative.     Objective:   Physical Exam Vitals reviewed.  Constitutional:      Appearance: Normal appearance.  Cardiovascular:     Rate and Rhythm: Normal rate and regular rhythm.     Heart sounds: Normal heart sounds.  Pulmonary:     Effort: Pulmonary effort is normal.     Breath sounds: Normal breath sounds.  Skin:    Findings: No erythema or rash.  Neurological:     Mental Status: He is alert.          Assessment & Plan:  Hypothyroidism, unspecified type - Plan: CBC with Differential/Platelet, COMPLETE METABOLIC PANEL WITH GFR, TSH, Vitamin B12  CKD (chronic kidney disease), stage V (HCC) - Plan: CBC with Differential/Platelet, COMPLETE METABOLIC PANEL WITH GFR, TSH, Vitamin B12  Tremors of nervous system - Plan: Ambulatory referral to Neurology  Myoclonic jerking - Plan: Ambulatory referral to Neurology  DDD (degenerative disc disease), thoracic First regarding his degenerative disc disease in the thoracic spine.  I plan to give him tramadol however he does have a history of long QT syndrome so I will avoid that medication and use low-dose Norco 5/325 1 p.o. every 6 hours as needed pain.  I have asked him to use the medication sparingly as needed.  Second regarding the myoclonic jerking.  I believe this is likely due to cerebral degeneration from longstanding chronic medical problems.  However I will get a second opinion with neurology.  Third regarding fatigue, I will check a TSH along with  a B12.  TSH is elevated obviously I will increase levothyroxine however if his labs are normal I would recommend discontinuation of diltiazem as this could be causing bradycardia and fatigue.

## 2022-02-16 DIAGNOSIS — Z23 Encounter for immunization: Secondary | ICD-10-CM | POA: Diagnosis not present

## 2022-02-16 DIAGNOSIS — Z992 Dependence on renal dialysis: Secondary | ICD-10-CM | POA: Diagnosis not present

## 2022-02-16 DIAGNOSIS — N186 End stage renal disease: Secondary | ICD-10-CM | POA: Diagnosis not present

## 2022-02-16 LAB — COMPLETE METABOLIC PANEL WITH GFR
AG Ratio: 1.4 (calc) (ref 1.0–2.5)
ALT: 18 U/L (ref 9–46)
AST: 17 U/L (ref 10–35)
Albumin: 3.6 g/dL (ref 3.6–5.1)
Alkaline phosphatase (APISO): 97 U/L (ref 35–144)
BUN/Creatinine Ratio: 18 (calc) (ref 6–22)
BUN: 68 mg/dL — ABNORMAL HIGH (ref 7–25)
CO2: 23 mmol/L (ref 20–32)
Calcium: 9.6 mg/dL (ref 8.6–10.3)
Chloride: 107 mmol/L (ref 98–110)
Creat: 3.84 mg/dL — ABNORMAL HIGH (ref 0.70–1.28)
Globulin: 2.6 g/dL (calc) (ref 1.9–3.7)
Glucose, Bld: 120 mg/dL — ABNORMAL HIGH (ref 65–99)
Potassium: 5.6 mmol/L — ABNORMAL HIGH (ref 3.5–5.3)
Sodium: 141 mmol/L (ref 135–146)
Total Bilirubin: 0.8 mg/dL (ref 0.2–1.2)
Total Protein: 6.2 g/dL (ref 6.1–8.1)
eGFR: 15 mL/min/{1.73_m2} — ABNORMAL LOW (ref >=60)

## 2022-02-16 LAB — CBC WITH DIFFERENTIAL/PLATELET
Absolute Monocytes: 503 cells/uL (ref 200–950)
Basophils Absolute: 27 cells/uL (ref 0–200)
Basophils Relative: 0.4 %
Eosinophils Absolute: 261 cells/uL (ref 15–500)
Eosinophils Relative: 3.9 %
HCT: 35.1 % — ABNORMAL LOW (ref 38.5–50.0)
Hemoglobin: 11.6 g/dL — ABNORMAL LOW (ref 13.2–17.1)
Lymphs Abs: 951 cells/uL (ref 850–3900)
MCH: 33.1 pg — ABNORMAL HIGH (ref 27.0–33.0)
MCHC: 33 g/dL (ref 32.0–36.0)
MCV: 100.3 fL — ABNORMAL HIGH (ref 80.0–100.0)
MPV: 12.8 fL — ABNORMAL HIGH (ref 7.5–12.5)
Monocytes Relative: 7.5 %
Neutro Abs: 4958 cells/uL (ref 1500–7800)
Neutrophils Relative %: 74 %
Platelets: 147 10*3/uL (ref 140–400)
RBC: 3.5 10*6/uL — ABNORMAL LOW (ref 4.20–5.80)
RDW: 12.7 % (ref 11.0–15.0)
Total Lymphocyte: 14.2 %
WBC: 6.7 10*3/uL (ref 3.8–10.8)

## 2022-02-16 LAB — TSH: TSH: 2.08 mIU/L (ref 0.40–4.50)

## 2022-02-16 LAB — VITAMIN B12: Vitamin B-12: 445 pg/mL (ref 200–1100)

## 2022-02-17 DIAGNOSIS — N186 End stage renal disease: Secondary | ICD-10-CM | POA: Diagnosis not present

## 2022-02-17 DIAGNOSIS — Z23 Encounter for immunization: Secondary | ICD-10-CM | POA: Diagnosis not present

## 2022-02-17 DIAGNOSIS — Z992 Dependence on renal dialysis: Secondary | ICD-10-CM | POA: Diagnosis not present

## 2022-02-18 DIAGNOSIS — Z992 Dependence on renal dialysis: Secondary | ICD-10-CM | POA: Diagnosis not present

## 2022-02-18 DIAGNOSIS — Z23 Encounter for immunization: Secondary | ICD-10-CM | POA: Diagnosis not present

## 2022-02-18 DIAGNOSIS — N186 End stage renal disease: Secondary | ICD-10-CM | POA: Diagnosis not present

## 2022-02-19 ENCOUNTER — Telehealth: Payer: Self-pay

## 2022-02-19 DIAGNOSIS — Z992 Dependence on renal dialysis: Secondary | ICD-10-CM | POA: Diagnosis not present

## 2022-02-19 DIAGNOSIS — N186 End stage renal disease: Secondary | ICD-10-CM | POA: Diagnosis not present

## 2022-02-19 DIAGNOSIS — Z23 Encounter for immunization: Secondary | ICD-10-CM | POA: Diagnosis not present

## 2022-02-19 NOTE — Telephone Encounter (Signed)
-----   Message from Susy Frizzle, MD sent at 02/16/2022  6:47 AM EDT ----- Thyroid is in normal range.  Hold diltiazem and see if fatigue improves.

## 2022-02-19 NOTE — Telephone Encounter (Signed)
I have attempted without success to contact this patient by phone to discuss lab results and I left a message on answering machine.

## 2022-02-20 DIAGNOSIS — N186 End stage renal disease: Secondary | ICD-10-CM | POA: Diagnosis not present

## 2022-02-20 DIAGNOSIS — Z23 Encounter for immunization: Secondary | ICD-10-CM | POA: Diagnosis not present

## 2022-02-20 DIAGNOSIS — Z992 Dependence on renal dialysis: Secondary | ICD-10-CM | POA: Diagnosis not present

## 2022-02-21 DIAGNOSIS — Z23 Encounter for immunization: Secondary | ICD-10-CM | POA: Diagnosis not present

## 2022-02-21 DIAGNOSIS — Z992 Dependence on renal dialysis: Secondary | ICD-10-CM | POA: Diagnosis not present

## 2022-02-21 DIAGNOSIS — N186 End stage renal disease: Secondary | ICD-10-CM | POA: Diagnosis not present

## 2022-02-22 DIAGNOSIS — N186 End stage renal disease: Secondary | ICD-10-CM | POA: Diagnosis not present

## 2022-02-22 DIAGNOSIS — Z992 Dependence on renal dialysis: Secondary | ICD-10-CM | POA: Diagnosis not present

## 2022-02-22 DIAGNOSIS — Z23 Encounter for immunization: Secondary | ICD-10-CM | POA: Diagnosis not present

## 2022-02-23 DIAGNOSIS — N186 End stage renal disease: Secondary | ICD-10-CM | POA: Diagnosis not present

## 2022-02-23 DIAGNOSIS — Z992 Dependence on renal dialysis: Secondary | ICD-10-CM | POA: Diagnosis not present

## 2022-02-23 DIAGNOSIS — Z23 Encounter for immunization: Secondary | ICD-10-CM | POA: Diagnosis not present

## 2022-02-24 DIAGNOSIS — N186 End stage renal disease: Secondary | ICD-10-CM | POA: Diagnosis not present

## 2022-02-24 DIAGNOSIS — Z23 Encounter for immunization: Secondary | ICD-10-CM | POA: Diagnosis not present

## 2022-02-24 DIAGNOSIS — Z992 Dependence on renal dialysis: Secondary | ICD-10-CM | POA: Diagnosis not present

## 2022-02-25 DIAGNOSIS — Z23 Encounter for immunization: Secondary | ICD-10-CM | POA: Diagnosis not present

## 2022-02-25 DIAGNOSIS — N186 End stage renal disease: Secondary | ICD-10-CM | POA: Diagnosis not present

## 2022-02-25 DIAGNOSIS — Z992 Dependence on renal dialysis: Secondary | ICD-10-CM | POA: Diagnosis not present

## 2022-02-26 DIAGNOSIS — Z992 Dependence on renal dialysis: Secondary | ICD-10-CM | POA: Diagnosis not present

## 2022-02-26 DIAGNOSIS — N186 End stage renal disease: Secondary | ICD-10-CM | POA: Diagnosis not present

## 2022-02-26 DIAGNOSIS — Z23 Encounter for immunization: Secondary | ICD-10-CM | POA: Diagnosis not present

## 2022-02-27 DIAGNOSIS — Z992 Dependence on renal dialysis: Secondary | ICD-10-CM | POA: Diagnosis not present

## 2022-02-27 DIAGNOSIS — Z23 Encounter for immunization: Secondary | ICD-10-CM | POA: Diagnosis not present

## 2022-02-27 DIAGNOSIS — N186 End stage renal disease: Secondary | ICD-10-CM | POA: Diagnosis not present

## 2022-02-28 DIAGNOSIS — Z992 Dependence on renal dialysis: Secondary | ICD-10-CM | POA: Diagnosis not present

## 2022-02-28 DIAGNOSIS — N186 End stage renal disease: Secondary | ICD-10-CM | POA: Diagnosis not present

## 2022-02-28 DIAGNOSIS — Z23 Encounter for immunization: Secondary | ICD-10-CM | POA: Diagnosis not present

## 2022-03-01 DIAGNOSIS — Z992 Dependence on renal dialysis: Secondary | ICD-10-CM | POA: Diagnosis not present

## 2022-03-01 DIAGNOSIS — N186 End stage renal disease: Secondary | ICD-10-CM | POA: Diagnosis not present

## 2022-03-02 DIAGNOSIS — Z992 Dependence on renal dialysis: Secondary | ICD-10-CM | POA: Diagnosis not present

## 2022-03-02 DIAGNOSIS — N186 End stage renal disease: Secondary | ICD-10-CM | POA: Diagnosis not present

## 2022-03-03 DIAGNOSIS — N186 End stage renal disease: Secondary | ICD-10-CM | POA: Diagnosis not present

## 2022-03-03 DIAGNOSIS — Z992 Dependence on renal dialysis: Secondary | ICD-10-CM | POA: Diagnosis not present

## 2022-03-04 DIAGNOSIS — Z992 Dependence on renal dialysis: Secondary | ICD-10-CM | POA: Diagnosis not present

## 2022-03-04 DIAGNOSIS — N186 End stage renal disease: Secondary | ICD-10-CM | POA: Diagnosis not present

## 2022-03-05 DIAGNOSIS — N186 End stage renal disease: Secondary | ICD-10-CM | POA: Diagnosis not present

## 2022-03-05 DIAGNOSIS — Z992 Dependence on renal dialysis: Secondary | ICD-10-CM | POA: Diagnosis not present

## 2022-03-06 DIAGNOSIS — N186 End stage renal disease: Secondary | ICD-10-CM | POA: Diagnosis not present

## 2022-03-06 DIAGNOSIS — Z992 Dependence on renal dialysis: Secondary | ICD-10-CM | POA: Diagnosis not present

## 2022-03-07 DIAGNOSIS — N186 End stage renal disease: Secondary | ICD-10-CM | POA: Diagnosis not present

## 2022-03-07 DIAGNOSIS — Z992 Dependence on renal dialysis: Secondary | ICD-10-CM | POA: Diagnosis not present

## 2022-03-09 DIAGNOSIS — N186 End stage renal disease: Secondary | ICD-10-CM | POA: Diagnosis not present

## 2022-03-09 DIAGNOSIS — Z992 Dependence on renal dialysis: Secondary | ICD-10-CM | POA: Diagnosis not present

## 2022-03-10 DIAGNOSIS — N186 End stage renal disease: Secondary | ICD-10-CM | POA: Diagnosis not present

## 2022-03-10 DIAGNOSIS — Z992 Dependence on renal dialysis: Secondary | ICD-10-CM | POA: Diagnosis not present

## 2022-03-11 DIAGNOSIS — N186 End stage renal disease: Secondary | ICD-10-CM | POA: Diagnosis not present

## 2022-03-11 DIAGNOSIS — Z992 Dependence on renal dialysis: Secondary | ICD-10-CM | POA: Diagnosis not present

## 2022-03-12 DIAGNOSIS — Z992 Dependence on renal dialysis: Secondary | ICD-10-CM | POA: Diagnosis not present

## 2022-03-12 DIAGNOSIS — N186 End stage renal disease: Secondary | ICD-10-CM | POA: Diagnosis not present

## 2022-03-13 DIAGNOSIS — N186 End stage renal disease: Secondary | ICD-10-CM | POA: Diagnosis not present

## 2022-03-13 DIAGNOSIS — Z992 Dependence on renal dialysis: Secondary | ICD-10-CM | POA: Diagnosis not present

## 2022-03-14 DIAGNOSIS — Z992 Dependence on renal dialysis: Secondary | ICD-10-CM | POA: Diagnosis not present

## 2022-03-14 DIAGNOSIS — N186 End stage renal disease: Secondary | ICD-10-CM | POA: Diagnosis not present

## 2022-03-15 DIAGNOSIS — Z992 Dependence on renal dialysis: Secondary | ICD-10-CM | POA: Diagnosis not present

## 2022-03-15 DIAGNOSIS — N186 End stage renal disease: Secondary | ICD-10-CM | POA: Diagnosis not present

## 2022-03-16 DIAGNOSIS — Z992 Dependence on renal dialysis: Secondary | ICD-10-CM | POA: Diagnosis not present

## 2022-03-16 DIAGNOSIS — N186 End stage renal disease: Secondary | ICD-10-CM | POA: Diagnosis not present

## 2022-03-17 DIAGNOSIS — N186 End stage renal disease: Secondary | ICD-10-CM | POA: Diagnosis not present

## 2022-03-17 DIAGNOSIS — Z992 Dependence on renal dialysis: Secondary | ICD-10-CM | POA: Diagnosis not present

## 2022-03-18 DIAGNOSIS — Z992 Dependence on renal dialysis: Secondary | ICD-10-CM | POA: Diagnosis not present

## 2022-03-18 DIAGNOSIS — N186 End stage renal disease: Secondary | ICD-10-CM | POA: Diagnosis not present

## 2022-03-19 DIAGNOSIS — H01002 Unspecified blepharitis right lower eyelid: Secondary | ICD-10-CM | POA: Diagnosis not present

## 2022-03-19 DIAGNOSIS — N186 End stage renal disease: Secondary | ICD-10-CM | POA: Diagnosis not present

## 2022-03-19 DIAGNOSIS — Z992 Dependence on renal dialysis: Secondary | ICD-10-CM | POA: Diagnosis not present

## 2022-03-19 DIAGNOSIS — H01001 Unspecified blepharitis right upper eyelid: Secondary | ICD-10-CM | POA: Diagnosis not present

## 2022-03-19 DIAGNOSIS — H2513 Age-related nuclear cataract, bilateral: Secondary | ICD-10-CM | POA: Diagnosis not present

## 2022-03-19 DIAGNOSIS — H35341 Macular cyst, hole, or pseudohole, right eye: Secondary | ICD-10-CM | POA: Diagnosis not present

## 2022-03-20 DIAGNOSIS — N186 End stage renal disease: Secondary | ICD-10-CM | POA: Diagnosis not present

## 2022-03-20 DIAGNOSIS — Z992 Dependence on renal dialysis: Secondary | ICD-10-CM | POA: Diagnosis not present

## 2022-03-21 DIAGNOSIS — Z992 Dependence on renal dialysis: Secondary | ICD-10-CM | POA: Diagnosis not present

## 2022-03-21 DIAGNOSIS — N186 End stage renal disease: Secondary | ICD-10-CM | POA: Diagnosis not present

## 2022-03-22 DIAGNOSIS — N186 End stage renal disease: Secondary | ICD-10-CM | POA: Diagnosis not present

## 2022-03-22 DIAGNOSIS — Z992 Dependence on renal dialysis: Secondary | ICD-10-CM | POA: Diagnosis not present

## 2022-03-23 DIAGNOSIS — N186 End stage renal disease: Secondary | ICD-10-CM | POA: Diagnosis not present

## 2022-03-23 DIAGNOSIS — Z992 Dependence on renal dialysis: Secondary | ICD-10-CM | POA: Diagnosis not present

## 2022-03-24 DIAGNOSIS — Z992 Dependence on renal dialysis: Secondary | ICD-10-CM | POA: Diagnosis not present

## 2022-03-24 DIAGNOSIS — N186 End stage renal disease: Secondary | ICD-10-CM | POA: Diagnosis not present

## 2022-03-25 DIAGNOSIS — Z992 Dependence on renal dialysis: Secondary | ICD-10-CM | POA: Diagnosis not present

## 2022-03-25 DIAGNOSIS — N186 End stage renal disease: Secondary | ICD-10-CM | POA: Diagnosis not present

## 2022-03-26 DIAGNOSIS — Z992 Dependence on renal dialysis: Secondary | ICD-10-CM | POA: Diagnosis not present

## 2022-03-26 DIAGNOSIS — N186 End stage renal disease: Secondary | ICD-10-CM | POA: Diagnosis not present

## 2022-03-27 DIAGNOSIS — N186 End stage renal disease: Secondary | ICD-10-CM | POA: Diagnosis not present

## 2022-03-27 DIAGNOSIS — Z992 Dependence on renal dialysis: Secondary | ICD-10-CM | POA: Diagnosis not present

## 2022-03-28 DIAGNOSIS — N186 End stage renal disease: Secondary | ICD-10-CM | POA: Diagnosis not present

## 2022-03-28 DIAGNOSIS — Z992 Dependence on renal dialysis: Secondary | ICD-10-CM | POA: Diagnosis not present

## 2022-03-29 DIAGNOSIS — N186 End stage renal disease: Secondary | ICD-10-CM | POA: Diagnosis not present

## 2022-03-29 DIAGNOSIS — Z992 Dependence on renal dialysis: Secondary | ICD-10-CM | POA: Diagnosis not present

## 2022-03-30 DIAGNOSIS — N186 End stage renal disease: Secondary | ICD-10-CM | POA: Diagnosis not present

## 2022-03-30 DIAGNOSIS — Z992 Dependence on renal dialysis: Secondary | ICD-10-CM | POA: Diagnosis not present

## 2022-03-31 DIAGNOSIS — Z992 Dependence on renal dialysis: Secondary | ICD-10-CM | POA: Diagnosis not present

## 2022-03-31 DIAGNOSIS — N186 End stage renal disease: Secondary | ICD-10-CM | POA: Diagnosis not present

## 2022-04-01 DIAGNOSIS — N186 End stage renal disease: Secondary | ICD-10-CM | POA: Diagnosis not present

## 2022-04-01 DIAGNOSIS — Z992 Dependence on renal dialysis: Secondary | ICD-10-CM | POA: Diagnosis not present

## 2022-04-02 DIAGNOSIS — N186 End stage renal disease: Secondary | ICD-10-CM | POA: Diagnosis not present

## 2022-04-02 DIAGNOSIS — Z992 Dependence on renal dialysis: Secondary | ICD-10-CM | POA: Diagnosis not present

## 2022-04-03 DIAGNOSIS — N186 End stage renal disease: Secondary | ICD-10-CM | POA: Diagnosis not present

## 2022-04-03 DIAGNOSIS — Z992 Dependence on renal dialysis: Secondary | ICD-10-CM | POA: Diagnosis not present

## 2022-04-04 DIAGNOSIS — N186 End stage renal disease: Secondary | ICD-10-CM | POA: Diagnosis not present

## 2022-04-04 DIAGNOSIS — Z992 Dependence on renal dialysis: Secondary | ICD-10-CM | POA: Diagnosis not present

## 2022-04-05 ENCOUNTER — Encounter: Payer: Self-pay | Admitting: Internal Medicine

## 2022-04-05 DIAGNOSIS — N186 End stage renal disease: Secondary | ICD-10-CM | POA: Diagnosis not present

## 2022-04-05 DIAGNOSIS — Z992 Dependence on renal dialysis: Secondary | ICD-10-CM | POA: Diagnosis not present

## 2022-04-06 DIAGNOSIS — E1122 Type 2 diabetes mellitus with diabetic chronic kidney disease: Secondary | ICD-10-CM | POA: Diagnosis not present

## 2022-04-06 DIAGNOSIS — N186 End stage renal disease: Secondary | ICD-10-CM | POA: Diagnosis not present

## 2022-04-06 DIAGNOSIS — Z992 Dependence on renal dialysis: Secondary | ICD-10-CM | POA: Diagnosis not present

## 2022-04-07 DIAGNOSIS — Z992 Dependence on renal dialysis: Secondary | ICD-10-CM | POA: Diagnosis not present

## 2022-04-07 DIAGNOSIS — N186 End stage renal disease: Secondary | ICD-10-CM | POA: Diagnosis not present

## 2022-04-08 DIAGNOSIS — N186 End stage renal disease: Secondary | ICD-10-CM | POA: Diagnosis not present

## 2022-04-08 DIAGNOSIS — Z992 Dependence on renal dialysis: Secondary | ICD-10-CM | POA: Diagnosis not present

## 2022-04-09 DIAGNOSIS — Z992 Dependence on renal dialysis: Secondary | ICD-10-CM | POA: Diagnosis not present

## 2022-04-09 DIAGNOSIS — N186 End stage renal disease: Secondary | ICD-10-CM | POA: Diagnosis not present

## 2022-04-10 DIAGNOSIS — N186 End stage renal disease: Secondary | ICD-10-CM | POA: Diagnosis not present

## 2022-04-10 DIAGNOSIS — Z992 Dependence on renal dialysis: Secondary | ICD-10-CM | POA: Diagnosis not present

## 2022-04-11 DIAGNOSIS — N186 End stage renal disease: Secondary | ICD-10-CM | POA: Diagnosis not present

## 2022-04-11 DIAGNOSIS — Z992 Dependence on renal dialysis: Secondary | ICD-10-CM | POA: Diagnosis not present

## 2022-04-12 DIAGNOSIS — N186 End stage renal disease: Secondary | ICD-10-CM | POA: Diagnosis not present

## 2022-04-12 DIAGNOSIS — Z992 Dependence on renal dialysis: Secondary | ICD-10-CM | POA: Diagnosis not present

## 2022-04-13 DIAGNOSIS — Z992 Dependence on renal dialysis: Secondary | ICD-10-CM | POA: Diagnosis not present

## 2022-04-13 DIAGNOSIS — N186 End stage renal disease: Secondary | ICD-10-CM | POA: Diagnosis not present

## 2022-04-14 DIAGNOSIS — N186 End stage renal disease: Secondary | ICD-10-CM | POA: Diagnosis not present

## 2022-04-14 DIAGNOSIS — Z992 Dependence on renal dialysis: Secondary | ICD-10-CM | POA: Diagnosis not present

## 2022-04-15 DIAGNOSIS — Z992 Dependence on renal dialysis: Secondary | ICD-10-CM | POA: Diagnosis not present

## 2022-04-15 DIAGNOSIS — N186 End stage renal disease: Secondary | ICD-10-CM | POA: Diagnosis not present

## 2022-04-16 DIAGNOSIS — N186 End stage renal disease: Secondary | ICD-10-CM | POA: Diagnosis not present

## 2022-04-16 DIAGNOSIS — Z992 Dependence on renal dialysis: Secondary | ICD-10-CM | POA: Diagnosis not present

## 2022-04-17 DIAGNOSIS — Z992 Dependence on renal dialysis: Secondary | ICD-10-CM | POA: Diagnosis not present

## 2022-04-17 DIAGNOSIS — N186 End stage renal disease: Secondary | ICD-10-CM | POA: Diagnosis not present

## 2022-04-18 DIAGNOSIS — Z992 Dependence on renal dialysis: Secondary | ICD-10-CM | POA: Diagnosis not present

## 2022-04-18 DIAGNOSIS — N186 End stage renal disease: Secondary | ICD-10-CM | POA: Diagnosis not present

## 2022-04-19 DIAGNOSIS — Z992 Dependence on renal dialysis: Secondary | ICD-10-CM | POA: Diagnosis not present

## 2022-04-19 DIAGNOSIS — N186 End stage renal disease: Secondary | ICD-10-CM | POA: Diagnosis not present

## 2022-04-20 DIAGNOSIS — N186 End stage renal disease: Secondary | ICD-10-CM | POA: Diagnosis not present

## 2022-04-20 DIAGNOSIS — Z992 Dependence on renal dialysis: Secondary | ICD-10-CM | POA: Diagnosis not present

## 2022-04-21 DIAGNOSIS — N186 End stage renal disease: Secondary | ICD-10-CM | POA: Diagnosis not present

## 2022-04-21 DIAGNOSIS — Z992 Dependence on renal dialysis: Secondary | ICD-10-CM | POA: Diagnosis not present

## 2022-04-22 DIAGNOSIS — N186 End stage renal disease: Secondary | ICD-10-CM | POA: Diagnosis not present

## 2022-04-22 DIAGNOSIS — Z992 Dependence on renal dialysis: Secondary | ICD-10-CM | POA: Diagnosis not present

## 2022-04-23 DIAGNOSIS — Z992 Dependence on renal dialysis: Secondary | ICD-10-CM | POA: Diagnosis not present

## 2022-04-23 DIAGNOSIS — N186 End stage renal disease: Secondary | ICD-10-CM | POA: Diagnosis not present

## 2022-04-24 DIAGNOSIS — N186 End stage renal disease: Secondary | ICD-10-CM | POA: Diagnosis not present

## 2022-04-24 DIAGNOSIS — Z992 Dependence on renal dialysis: Secondary | ICD-10-CM | POA: Diagnosis not present

## 2022-04-25 DIAGNOSIS — N186 End stage renal disease: Secondary | ICD-10-CM | POA: Diagnosis not present

## 2022-04-25 DIAGNOSIS — Z992 Dependence on renal dialysis: Secondary | ICD-10-CM | POA: Diagnosis not present

## 2022-04-26 DIAGNOSIS — Z992 Dependence on renal dialysis: Secondary | ICD-10-CM | POA: Diagnosis not present

## 2022-04-26 DIAGNOSIS — N186 End stage renal disease: Secondary | ICD-10-CM | POA: Diagnosis not present

## 2022-04-27 DIAGNOSIS — Z992 Dependence on renal dialysis: Secondary | ICD-10-CM | POA: Diagnosis not present

## 2022-04-27 DIAGNOSIS — N186 End stage renal disease: Secondary | ICD-10-CM | POA: Diagnosis not present

## 2022-04-28 DIAGNOSIS — Z992 Dependence on renal dialysis: Secondary | ICD-10-CM | POA: Diagnosis not present

## 2022-04-28 DIAGNOSIS — N186 End stage renal disease: Secondary | ICD-10-CM | POA: Diagnosis not present

## 2022-04-29 DIAGNOSIS — Z992 Dependence on renal dialysis: Secondary | ICD-10-CM | POA: Diagnosis not present

## 2022-04-29 DIAGNOSIS — N186 End stage renal disease: Secondary | ICD-10-CM | POA: Diagnosis not present

## 2022-04-30 DIAGNOSIS — Z992 Dependence on renal dialysis: Secondary | ICD-10-CM | POA: Diagnosis not present

## 2022-04-30 DIAGNOSIS — N186 End stage renal disease: Secondary | ICD-10-CM | POA: Diagnosis not present

## 2022-05-01 DIAGNOSIS — N186 End stage renal disease: Secondary | ICD-10-CM | POA: Diagnosis not present

## 2022-05-01 DIAGNOSIS — Z992 Dependence on renal dialysis: Secondary | ICD-10-CM | POA: Diagnosis not present

## 2022-05-02 ENCOUNTER — Encounter: Payer: Self-pay | Admitting: Student in an Organized Health Care Education/Training Program

## 2022-05-02 ENCOUNTER — Ambulatory Visit
Payer: Medicare Other | Attending: Student in an Organized Health Care Education/Training Program | Admitting: Student in an Organized Health Care Education/Training Program

## 2022-05-02 VITALS — BP 177/79 | HR 55 | Temp 97.9°F | Resp 16 | Ht 68.0 in | Wt 140.0 lb

## 2022-05-02 DIAGNOSIS — M546 Pain in thoracic spine: Secondary | ICD-10-CM | POA: Diagnosis not present

## 2022-05-02 DIAGNOSIS — G8929 Other chronic pain: Secondary | ICD-10-CM | POA: Diagnosis not present

## 2022-05-02 DIAGNOSIS — Z992 Dependence on renal dialysis: Secondary | ICD-10-CM | POA: Diagnosis not present

## 2022-05-02 DIAGNOSIS — M5124 Other intervertebral disc displacement, thoracic region: Secondary | ICD-10-CM | POA: Diagnosis not present

## 2022-05-02 DIAGNOSIS — M47894 Other spondylosis, thoracic region: Secondary | ICD-10-CM

## 2022-05-02 DIAGNOSIS — N186 End stage renal disease: Secondary | ICD-10-CM | POA: Diagnosis not present
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
## 2022-05-02 NOTE — Progress Notes (Signed)
Safety precautions to be maintained throughout the outpatient stay will include: orient to surroundings, keep bed in low position, maintain call bell within reach at all times, provide assistance with transfer out of bed and ambulation.  

## 2022-05-02 NOTE — Patient Instructions (Signed)
GENERAL RISKS AND COMPLICATIONS ° °What are the risk, side effects and possible complications? °Generally speaking, most procedures are safe.  However, with any procedure there are risks, side effects, and the possibility of complications.  The risks and complications are dependent upon the sites that are lesioned, or the type of nerve block to be performed.  The closer the procedure is to the spine, the more serious the risks are.  Great care is taken when placing the radio frequency needles, block needles or lesioning probes, but sometimes complications can occur. °Infection: Any time there is an injection through the skin, there is a risk of infection.  This is why sterile conditions are used for these blocks.  There are four possible types of infection. °Localized skin infection. °Central Nervous System Infection-This can be in the form of Meningitis, which can be deadly. °Epidural Infections-This can be in the form of an epidural abscess, which can cause pressure inside of the spine, causing compression of the spinal cord with subsequent paralysis. This would require an emergency surgery to decompress, and there are no guarantees that the patient would recover from the paralysis. °Discitis-This is an infection of the intervertebral discs.  It occurs in about 1% of discography procedures.  It is difficult to treat and it may lead to surgery. ° °      2. Pain: the needles have to go through skin and soft tissues, will cause soreness. °      3. Damage to internal structures:  The nerves to be lesioned may be near blood vessels or   ° other nerves which can be potentially damaged. °      4. Bleeding: Bleeding is more common if the patient is taking blood thinners such as  aspirin, Coumadin, Ticiid, Plavix, etc., or if he/she have some genetic predisposition  such as hemophilia. Bleeding into the spinal canal can cause compression of the spinal  cord with subsequent paralysis.  This would require an emergency  surgery to  decompress and there are no guarantees that the patient would recover from the  paralysis. °      5. Pneumothorax:  Puncturing of a lung is a possibility, every time a needle is introduced in  the area of the chest or upper back.  Pneumothorax refers to free air around the  collapsed lung(s), inside of the thoracic cavity (chest cavity).  Another two possible  complications related to a similar event would include: Hemothorax and Chylothorax.   These are variations of the Pneumothorax, where instead of air around the collapsed  lung(s), you may have blood or chyle, respectively. °      6. Spinal headaches: They may occur with any procedures in the area of the spine. °      7. Persistent CSF (Cerebro-Spinal Fluid) leakage: This is a rare problem, but may occur  with prolonged intrathecal or epidural catheters either due to the formation of a fistulous  track or a dural tear. °      8. Nerve damage: By working so close to the spinal cord, there is always a possibility of  nerve damage, which could be as serious as a permanent spinal cord injury with  paralysis. °      9. Death:  Although rare, severe deadly allergic reactions known as "Anaphylactic  reaction" can occur to any of the medications used. °     10. Worsening of the symptoms:  We can always make thing worse. ° °What are the chances   of something like this happening? °Chances of any of this occuring are extremely low.  By statistics, you have more of a chance of getting killed in a motor vehicle accident: while driving to the hospital than any of the above occurring .  Nevertheless, you should be aware that they are possibilities.  In general, it is similar to taking a shower.  Everybody knows that you can slip, hit your head and get killed.  Does that mean that you should not shower again?  Nevertheless always keep in mind that statistics do not mean anything if you happen to be on the wrong side of them.  Even if a procedure has a 1 (one) in a  1,000,000 (million) chance of going wrong, it you happen to be that one..Also, keep in mind that by statistics, you have more of a chance of having something go wrong when taking medications. ° °Who should not have this procedure? °If you are on a blood thinning medication (e.g. Coumadin, Plavix, see list of "Blood Thinners"), or if you have an active infection going on, you should not have the procedure.  If you are taking any blood thinners, please inform your physician. ° °How should I prepare for this procedure? °Do not eat or drink anything at least six hours prior to the procedure. °Bring a driver with you .  It cannot be a taxi. °Come accompanied by an adult that can drive you back, and that is strong enough to help you if your legs get weak or numb from the local anesthetic. °Take all of your medicines the morning of the procedure with just enough water to swallow them. °If you have diabetes, make sure that you are scheduled to have your procedure done first thing in the morning, whenever possible. °If you have diabetes, take only half of your insulin dose and notify our nurse that you have done so as soon as you arrive at the clinic. °If you are diabetic, but only take blood sugar pills (oral hypoglycemic), then do not take them on the morning of your procedure.  You may take them after you have had the procedure. °Do not take aspirin or any aspirin-containing medications, at least eleven (11) days prior to the procedure.  They may prolong bleeding. °Wear loose fitting clothing that may be easy to take off and that you would not mind if it got stained with Betadine or blood. °Do not wear any jewelry or perfume °Remove any nail coloring.  It will interfere with some of our monitoring equipment. ° °NOTE: Remember that this is not meant to be interpreted as a complete list of all possible complications.  Unforeseen problems may occur. ° °BLOOD THINNERS °The following drugs contain aspirin or other products,  which can cause increased bleeding during surgery and should not be taken for 2 weeks prior to and 1 week after surgery.  If you should need take something for relief of minor pain, you may take acetaminophen which is found in Tylenol,m Datril, Anacin-3 and Panadol. It is not blood thinner. The products listed below are.  Do not take any of the products listed below in addition to any listed on your instruction sheet. ° °A.P.C or A.P.C with Codeine Codeine Phosphate Capsules #3 Ibuprofen Ridaura  °ABC compound Congesprin Imuran rimadil  °Advil Cope Indocin Robaxisal  °Alka-Seltzer Effervescent Pain Reliever and Antacid Coricidin or Coricidin-D ° Indomethacin Rufen  °Alka-Seltzer plus Cold Medicine Cosprin Ketoprofen S-A-C Tablets  °Anacin Analgesic Tablets or Capsules Coumadin   Korlgesic Salflex  Anacin Extra Strength Analgesic tablets or capsules CP-2 Tablets Lanoril Salicylate  Anaprox Cuprimine Capsules Levenox Salocol  Anexsia-D Dalteparin Magan Salsalate  Anodynos Darvon compound Magnesium Salicylate Sine-off  Ansaid Dasin Capsules Magsal Sodium Salicylate  Anturane Depen Capsules Marnal Soma  APF Arthritis pain formula Dewitt's Pills Measurin Stanback  Argesic Dia-Gesic Meclofenamic Sulfinpyrazone  Arthritis Bayer Timed Release Aspirin Diclofenac Meclomen Sulindac  Arthritis pain formula Anacin Dicumarol Medipren Supac  Analgesic (Safety coated) Arthralgen Diffunasal Mefanamic Suprofen  Arthritis Strength Bufferin Dihydrocodeine Mepro Compound Suprol  Arthropan liquid Dopirydamole Methcarbomol with Aspirin Synalgos  ASA tablets/Enseals Disalcid Micrainin Tagament  Ascriptin Doan's Midol Talwin  Ascriptin A/D Dolene Mobidin Tanderil  Ascriptin Extra Strength Dolobid Moblgesic Ticlid  Ascriptin with Codeine Doloprin or Doloprin with Codeine Momentum Tolectin  Asperbuf Duoprin Mono-gesic Trendar  Aspergum Duradyne Motrin or Motrin IB Triminicin  Aspirin plain, buffered or enteric coated  Durasal Myochrisine Trigesic  Aspirin Suppositories Easprin Nalfon Trillsate  Aspirin with Codeine Ecotrin Regular or Extra Strength Naprosyn Uracel  Atromid-S Efficin Naproxen Ursinus  Auranofin Capsules Elmiron Neocylate Vanquish  Axotal Emagrin Norgesic Verin  Azathioprine Empirin or Empirin with Codeine Normiflo Vitamin E  Azolid Emprazil Nuprin Voltaren  Bayer Aspirin plain, buffered or children's or timed BC Tablets or powders Encaprin Orgaran Warfarin Sodium  Buff-a-Comp Enoxaparin Orudis Zorpin  Buff-a-Comp with Codeine Equegesic Os-Cal-Gesic   Buffaprin Excedrin plain, buffered or Extra Strength Oxalid   Bufferin Arthritis Strength Feldene Oxphenbutazone   Bufferin plain or Extra Strength Feldene Capsules Oxycodone with Aspirin   Bufferin with Codeine Fenoprofen Fenoprofen Pabalate or Pabalate-SF   Buffets II Flogesic Panagesic   Buffinol plain or Extra Strength Florinal or Florinal with Codeine Panwarfarin   Buf-Tabs Flurbiprofen Penicillamine   Butalbital Compound Four-way cold tablets Penicillin   Butazolidin Fragmin Pepto-Bismol   Carbenicillin Geminisyn Percodan   Carna Arthritis Reliever Geopen Persantine   Carprofen Gold's salt Persistin   Chloramphenicol Goody's Phenylbutazone   Chloromycetin Haltrain Piroxlcam   Clmetidine heparin Plaquenil   Cllnoril Hyco-pap Ponstel   Clofibrate Hydroxy chloroquine Propoxyphen         Before stopping any of these medications, be sure to consult the physician who ordered them.  Some, such as Coumadin (Warfarin) are ordered to prevent or treat serious conditions such as "deep thrombosis", "pumonary embolisms", and other heart problems.  The amount of time that you may need off of the medication may also vary with the medication and the reason for which you were taking it.  If you are taking any of these medications, please make sure you notify your pain physician before you undergo any procedures.         Selective Nerve Root  Block Patient Information  Description: Specific nerve roots exit the spinal canal and these nerves can be compressed and inflamed by a bulging disc and bone spurs.  By injecting steroids on the nerve root, we can potentially decrease the inflammation surrounding these nerves, which often leads to decreased pain.  Also, by injecting local anesthesia on the nerve root, this can provide [place] helpful information to give to your referring doctor if it decreases your pain.  Selective nerve root blocks can be done along the spine from the neck to the low back depending on the location of your pain.   After numbing the skin with local anesthesia, a small needle is passed to the nerve root and the position of the needle is verified using x-ray pictures.  After the needle is  in correct position, we then deposit the medication.  You may experience a pressure sensation while this is being done.  The entire block usually lasts less than 15 minutes.  Conditions that may be treated with selective nerve root blocks: Low back and leg pain Spinal stenosis Diagnostic block prior to potential surgery Neck and arm pain Post laminectomy syndrome  Preparation for the injection:  Do not eat any solid food or dairy products within 8 hours of your appointment. You may drink clear liquids up to 3 hours before an appointment.  Clear liquids include water, black coffee, juice or soda.  No milk or cream please. You may take your regular medications, including pain medications, with a sip of water before your appointment.  Diabetics should hold regular insulin (if taken separately) and take 1/2 normal NPH dose the morning of the procedure.  Carry some sugar containing items with you to your appointment. A driver must accompany you and be prepared to drive you home after your procedure. Bring all your current medications with you. An IV may be inserted and sedation may be given at the discretion of the physician. A blood  pressure cuff, EKG, and other monitors will often be applied during the procedure.  Some patients may need to have extra oxygen administered for a short period. You will be asked to provide medical information, including allergies, prior to the procedure.  We must know immediately if you are taking blood  Thinners (like Coumadin) or if you are allergic to IV iodine contrast (dye).  Possible side-effects: All are usually temporary Bleeding from needle site Light headedness Numbness and tingling Decreased blood pressure Weakness in arms/legs Pressure sensation in back/neck Pain at injection site (several days)  Possible complications: All are extremely rare Infection Nerve injury Spinal headache (a headache wore with upright position)  Call if you experience: Fever/chills associated with headache or increased back/neck pain Headache worsened by an upright position New onset weakness or numbness of an extremity below the injection site Hives or difficulty breathing (go to the emergency room) Inflammation or drainage at the injection site(s) Severe back/neck pain greater than usual New symptoms which are concerning to you  Please note:  Although the local anesthetic injected can often make your back or neck feel good for several hours after the injection the pain will likely return.  It takes 3-5 days for steroids to work on the nerve root. You may not notice any pain relief for at least one week.  If effective, we will often do a series of 3 injections spaced 3-6 weeks apart to maximally decrease your pain.    If you have any questions, please call [phone] [facility]

## 2022-05-03 DIAGNOSIS — N186 End stage renal disease: Secondary | ICD-10-CM | POA: Diagnosis not present

## 2022-05-03 DIAGNOSIS — Z992 Dependence on renal dialysis: Secondary | ICD-10-CM | POA: Diagnosis not present

## 2022-05-04 DIAGNOSIS — Z992 Dependence on renal dialysis: Secondary | ICD-10-CM | POA: Diagnosis not present

## 2022-05-04 DIAGNOSIS — N186 End stage renal disease: Secondary | ICD-10-CM | POA: Diagnosis not present

## 2022-05-05 DIAGNOSIS — N186 End stage renal disease: Secondary | ICD-10-CM | POA: Diagnosis not present

## 2022-05-05 DIAGNOSIS — Z992 Dependence on renal dialysis: Secondary | ICD-10-CM | POA: Diagnosis not present

## 2022-05-06 DIAGNOSIS — N186 End stage renal disease: Secondary | ICD-10-CM | POA: Diagnosis not present

## 2022-05-06 DIAGNOSIS — Z992 Dependence on renal dialysis: Secondary | ICD-10-CM | POA: Diagnosis not present

## 2022-05-07 DIAGNOSIS — Z992 Dependence on renal dialysis: Secondary | ICD-10-CM | POA: Diagnosis not present

## 2022-05-07 DIAGNOSIS — N186 End stage renal disease: Secondary | ICD-10-CM | POA: Diagnosis not present

## 2022-05-08 DIAGNOSIS — Z992 Dependence on renal dialysis: Secondary | ICD-10-CM | POA: Diagnosis not present

## 2022-05-08 DIAGNOSIS — N186 End stage renal disease: Secondary | ICD-10-CM | POA: Diagnosis not present

## 2022-05-09 ENCOUNTER — Ambulatory Visit (HOSPITAL_BASED_OUTPATIENT_CLINIC_OR_DEPARTMENT_OTHER): Payer: Medicare Other | Admitting: Student in an Organized Health Care Education/Training Program

## 2022-05-09 ENCOUNTER — Other Ambulatory Visit: Payer: Self-pay

## 2022-05-09 ENCOUNTER — Other Ambulatory Visit: Payer: Self-pay | Admitting: Student in an Organized Health Care Education/Training Program

## 2022-05-09 ENCOUNTER — Ambulatory Visit
Admission: RE | Admit: 2022-05-09 | Discharge: 2022-05-09 | Disposition: A | Discharge status: Home / Self Care | Payer: Medicare Other | Source: Ambulatory Visit | Attending: Student in an Organized Health Care Education/Training Program | Admitting: Student in an Organized Health Care Education/Training Program

## 2022-05-09 ENCOUNTER — Encounter: Payer: Self-pay | Admitting: Student in an Organized Health Care Education/Training Program

## 2022-05-09 DIAGNOSIS — M47894 Other spondylosis, thoracic region: Secondary | ICD-10-CM | POA: Insufficient documentation

## 2022-05-09 DIAGNOSIS — M546 Pain in thoracic spine: Secondary | ICD-10-CM | POA: Diagnosis not present

## 2022-05-09 DIAGNOSIS — R52 Pain, unspecified: Secondary | ICD-10-CM | POA: Insufficient documentation

## 2022-05-09 DIAGNOSIS — M5124 Other intervertebral disc displacement, thoracic region: Secondary | ICD-10-CM

## 2022-05-09 DIAGNOSIS — G8929 Other chronic pain: Secondary | ICD-10-CM | POA: Insufficient documentation

## 2022-05-09 DIAGNOSIS — Z992 Dependence on renal dialysis: Secondary | ICD-10-CM | POA: Diagnosis not present

## 2022-05-09 DIAGNOSIS — N186 End stage renal disease: Secondary | ICD-10-CM | POA: Diagnosis not present

## 2022-05-09 MED ORDER — ROPIVACAINE HCL 2 MG/ML IJ SOLN
INTRAMUSCULAR | Status: AC
Start: 2022-05-09 — End: ?
  Filled 2022-05-09: qty 20

## 2022-05-09 MED ORDER — ROPIVACAINE HCL 2 MG/ML IJ SOLN
9.0000 mL | Freq: Once | INTRAMUSCULAR | Status: DC
  Administered 2022-05-09: 9 mL via PERINEURAL

## 2022-05-09 MED ORDER — DEXAMETHASONE SODIUM PHOSPHATE 10 MG/ML IJ SOLN
10.0000 mg | Freq: Once | INTRAMUSCULAR | Status: DC
  Administered 2022-05-09: 10 mg

## 2022-05-09 MED ORDER — LIDOCAINE HCL 2 % IJ SOLN
20.0000 mL | Freq: Once | INTRAMUSCULAR | Status: DC
Start: 2022-05-09 — End: 2022-05-09
  Administered 2022-05-09: 400 mg

## 2022-05-09 MED ORDER — DEXAMETHASONE SODIUM PHOSPHATE 10 MG/ML IJ SOLN
INTRAMUSCULAR | Status: AC
  Filled 2022-05-09: qty 2

## 2022-05-09 MED ORDER — LIDOCAINE HCL 2 % IJ SOLN
INTRAMUSCULAR | Status: AC
  Filled 2022-05-09: qty 20

## 2022-05-09 NOTE — Progress Notes (Signed)
Safety precautions to be maintained throughout the outpatient stay will include: orient to surroundings, keep bed in low position, maintain call bell within reach at all times, provide assistance with transfer out of bed and ambulation.  

## 2022-05-09 NOTE — Patient Instructions (Signed)

## 2022-05-09 NOTE — Progress Notes (Signed)
[facility] Note  05/14/2022     CHIEF COMPLAINT Patient presents for Retina Follow Up   HISTORY OF PRESENT ILLNESS: Bradley Black is a 76 y.o. male who presents to the clinic today for:   HPI     Retina Follow Up   Patient presents with  Other.  In both eyes.  This started 6 months ago.  I, the attending physician,  performed the HPI with the patient and updated documentation appropriately.        Comments   Patient here for 6 months retina follow up for mac tel OU. Patient states vision  hasn't improved any. About the same. No eye pain.       Last edited by Bernarda Caffey, MD on 05/14/2022  1:01 PM.      Referring physician: Susy Frizzle, MD [address] [place],  [name] [address]  HISTORICAL INFORMATION:   Selected notes from the MEDICAL RECORD NUMBER Referred by Dr. Leticia Clas for macular cyst LEE:  Ocular Hx- PMH-    CURRENT MEDICATIONS: Current Outpatient Medications (Ophthalmic Drugs)  Medication Sig   brimonidine (ALPHAGAN) 0.2 % ophthalmic solution Place 1 drop into both eyes 2 (two) times daily.   No current facility-administered medications for this visit. (Ophthalmic Drugs)   Current Outpatient Medications (Other)  Medication Sig   aspirin EC 81 MG tablet Take 81 mg by mouth daily.   Cholecalciferol (VITAMIN D3) 25 MCG (1000 UT) CAPS Take 1 capsule (1,000 Units total) by mouth daily at 6 (six) AM.   citalopram (CELEXA) 10 MG tablet Take 10 mg by mouth daily.   desvenlafaxine (PRISTIQ) 25 MG 24 hr tablet TAKE ONE TABLET BY MOUTH DAILY.   diltiazem (CARDIZEM CD) 120 MG 24 hr capsule TAKE ONE CAPSULE (120MG TOTAL) BY MOUTH DAILY   doxazosin (CARDURA) 8 MG tablet Take 8 mg by mouth daily.   ENTRESTO 97-103 MG TAKE ONE TABLET BY MOUTH TWICE A DAY   furosemide (LASIX) 20 MG tablet Take 20 mg by mouth daily.   hydrALAZINE (APRESOLINE) 50 MG tablet Take 50 mg by mouth 3 (three) times daily.   levothyroxine  (SYNTHROID) 50 MCG tablet Take 1 tablet (50 mcg total) by mouth daily before breakfast.   multivitamin (RENA-VIT) TABS tablet Take 1 tablet by mouth daily.   pantoprazole (PROTONIX) 40 MG tablet TAKE ONE TABLET BY MOUTH TWICE A DAY   pravastatin (PRAVACHOL) 20 MG tablet TAKE ONE TABLET (20MG TOTAL) BY MOUTH DAILY   torsemide (DEMADEX) 20 MG tablet Take 20 mg by mouth daily as needed (swelling).   No current facility-administered medications for this visit. (Other)   REVIEW OF SYSTEMS: ROS   Positive for: Genitourinary, Endocrine, Cardiovascular, Eyes Negative for: Constitutional, Gastrointestinal, Neurological, Skin, Musculoskeletal, HENT, Respiratory, Psychiatric, Allergic/Imm, Heme/Lymph Last edited by Theodore Demark, COA on 05/14/2022 10:07 AM.     ALLERGIES No Known Allergies  PAST MEDICAL HISTORY Past Medical History:  Diagnosis Date   BPH (benign prostatic hyperplasia)    CAD S/P percutaneous coronary angioplasty    a. PTCA of [place] b. PCI with BMS to LAD in 1997 c. RCA PCI [place] d. s/p CABG in 11/2011 with LIMA-LAD, SVG-PDA, SVG-OM2, and SVG-D1   Cataract    Chronic back pain    CKD (chronic kidney disease), stage III (HCC)    Cyst of bursa    R shoulder   Diabetic retinopathy ([place])  DM (diabetes mellitus), type 2 with renal complications (HCC)    Essential hypertension    Frequent PVCs    GERD (gastroesophageal reflux disease)    Hypertensive retinopathy    Ischemic cardiomyopathy 11/2011   Intra-OP TEE: EF 40-45%, no regional WMA; improved Anterior WM post CABG.   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   LIMA to LAD, SVG to D1, SVG to OM2, SVG to PDA, EVH via right thigh and leg   Past Surgical History:  Procedure Laterality Date   AV FISTULA PLACEMENT Left 07/28/2020   Procedure: LEFT ARM ARTERIOVENOUS (AV) FISTULA  CREATION;  Surgeon: Rosetta Posner, MD;  Location: [facility];  Service: Vascular;   Laterality: Left;   [place]   BIOPSY  01/25/2022   Procedure: BIOPSY;  Surgeon: Daneil Dolin, MD;  [facility];  Service: Endoscopy;;   CARDIAC CATHETERIZATION  2013   COLONOSCOPY WITH PROPOFOL N/A 01/25/2022   Procedure: COLONOSCOPY WITH PROPOFOL;  Surgeon: Daneil Dolin, MD;  [facility];  Service: Endoscopy;  Laterality: N/A;  10:30am   CORONARY ANGIOPLASTY  1994   OM   CORONARY ANGIOPLASTY  1997   LAD   CORONARY ANGIOPLASTY WITH STENT PLACEMENT  1999   RCA   CORONARY ANGIOPLASTY WITH STENT PLACEMENT  2001   RCA   CORONARY ARTERY BYPASS GRAFT  12/27/2011   Procedure: CORONARY ARTERY BYPASS GRAFTING (CABG);  Surgeon: Rexene Alberts, MD;  Location: [facility];  Service: Open Heart Surgery;  Laterality: N/A;  Times four. On pump. Using endoscopically harvested right greater saphenous vein and left internal mammary artery.    ESOPHAGOGASTRODUODENOSCOPY (EGD) WITH PROPOFOL N/A 01/25/2022   Procedure: ESOPHAGOGASTRODUODENOSCOPY (EGD) WITH PROPOFOL;  Surgeon: Daneil Dolin, MD;  [facility];  Service: Endoscopy;  Laterality: N/A;   HERNIA REPAIR     INCISION AND DRAINAGE OF WOUND  2006   [place]   INSERTION OF DIALYSIS CATHETER Right 07/25/2020   Procedure: INSERTION OF DIALYSIS CATHETER;  Surgeon: Virl Cagey, MD;  Location: [facility];  Service: General;  Laterality: Right;   INTRAOPERATIVE TRANSESOPHAGEAL ECHOCARDIOGRAM  12/27/2011   Global hypokinesis with EF of 40-45%, improved LAD distribution wall motion.   LEFT HEART CATHETERIZATION WITH CORONARY ANGIOGRAM N/A 12/13/2011   Procedure: LEFT HEART CATHETERIZATION WITH CORONARY ANGIOGRAM;  Surgeon: Leonie Man, MD;  Location: [facility] C[facility];  Service: Cardiovascular;  Laterality: N/A;   LUMBAR FUSION     MASS EXCISION  10/24/11   R arm   POLYPECTOMY  01/25/2022   Procedure: POLYPECTOMY;  Surgeon: Daneil Dolin, MD;  [facility];  Service: Endoscopy;;   RIGHT HEART CATH N/A  07/12/2020   Procedure: RIGHT HEART CATH;  Surgeon: Belva Crome, MD;  Location: [facility];  Service: Cardiovascular;  Laterality: N/A;   TRANSESOPHAGEAL ECHOCARDIOGRAM  2013   FAMILY HISTORY Family History  Problem Relation Age of Onset   Cancer Mother        breast   Cancer Father        bone   [place] cancer Neg Hx    SOCIAL HISTORY Social History   Tobacco Use   Smoking status: Former    Types: Cigarettes    Quit date: 10/01/1986    Years since quitting: 35.6   Smokeless tobacco: Never   Tobacco comments:    quit about 30 yrs  ago  Vaping Use   Vaping Use: Never used  Substance Use Topics   Alcohol use: No   Drug use: No       OPHTHALMIC EXAM:  Base Eye Exam     Visual Acuity (Snellen - Linear)       Right Left   Dist [place] 20/60 20/70 -2   Dist ph [place] 20/50 20/40         Tonometry (Tonopen, 10:04 AM)       Right Left   Pressure 22 21         Pupils       Dark Light Shape React APD   Right 3 2 Round Brisk None   Left 3 2 Round Brisk None         Visual Fields (Counting fingers)       Left Right    Full Full         Extraocular Movement       Right Left    Full, Ortho Full, Ortho         Neuro/Psych     Oriented x3: Yes   Mood/Affect: Normal         Dilation     Both eyes: 1.0% Mydriacyl, 2.5% Phenylephrine @ 10:04 AM           Slit Lamp and Fundus Exam     Slit Lamp Exam       Right Left   Lids/Lashes Dermatochalasis - upper lid Dermatochalasis - upper lid   Conjunctiva/Sclera nasal and temporal pinguecula temporal pinguecula   Cornea arcus, trace PEE arcus, trace PEE   Anterior Chamber moderate depth, narrow temporal angle deep, clear, narrow temporal angle   Iris Round and dilated, No NVI, mild anterior bowing Round and dilated, No NVI, mild anterior bowing   Lens 3+ Nuclear sclerosis with brunescence, 3+ Cortical cataract 3+ Nuclear sclerosis with brunescence, 3+ Cortical cataract   Anterior Vitreous  Vitreous syneresis Vitreous syneresis         Fundus Exam       Right Left   Disc Pink and Sharp Pink and Sharp, mild PPA   C/D Ratio 0.2 0.2   Macula Flat, Blunted foveal reflex, central cystic changes, right angle vessels, no heme, no refractile deposits Flat, Blunted foveal reflex, mild RPE mottling, right angle vessels, no heme   Vessels mild attenuation, right angle vessels, mild tortuosity mild attenuation, mild tortuosity, right angle vessels, mild copper wiring   Periphery Attached, No heme, mild reticular degeneration Attached, No heme           IMAGING AND PROCEDURES  Imaging and Procedures for 05/14/2022  OCT, Retina - OU - Both Eyes       Right Eye Quality was good. Central Foveal Thickness: 284. Progression has improved. Findings include no SRF, abnormal foveal contour, intraretinal fluid, outer retinal atrophy, vitreomacular adhesion (Mild interval improvement in cystic change, persistent ellipsoid disruption / ORA).   Left Eye Quality was good. Central Foveal Thickness: 268. Progression has been stable. Findings include no SRF, abnormal foveal contour, intraretinal fluid, vitreomacular adhesion (Persistent central cystic changes -- ?VMT component -- stable from prior).   Notes *Images captured and stored on drive  Diagnosis / Impression:  mac tel 2 OU OD: Mild interval improvement in cystic change, persistent ellipsoid disruption / ORA OS: persistent central cystic changes -- ?VMT component -- stable from prior  Clinical management:  See below  Abbreviations: NFP - Normal foveal profile. CME -  cystoid macular edema. PED - pigment epithelial detachment. IRF - intraretinal fluid. SRF - subretinal fluid. EZ - ellipsoid zone. ERM - epiretinal membrane. ORA - outer retinal atrophy. ORT - outer retinal tubulation. SRHM - subretinal hyper-reflective material. IRHM - intraretinal hyper-reflective material      Fluorescein Angiography Optos (Transit OS)        Right Eye Progression has no prior data. Early phase findings include staining. Mid/Late phase findings include leakage, staining (Focal early staining and late leakage just temporal to fovea, no MAs).   Left Eye Progression has no prior data. Early phase findings include staining. Mid/Late phase findings include leakage, staining (Focal early staining and late leakage just temporal to fovea, no MAs).   Notes **Images stored on drive**  Impression: Mac tel 2 Focal early staining and late leakage just temporal to fovea OU            ASSESSMENT/PLAN:    ICD-10-CM   1. 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 Diabetes mellitus type 2 without retinopathy ([place])  E11.9     3. Essential hypertension  I10     4. Hypertensive retinopathy of both eyes  H35.033 Fluorescein Angiography Optos (Transit OS)    5. Combined forms of age-related cataract of both eyes  H25.813     6. Bilateral ocular hypertension  H40.053      1. 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 Zigmund Daniel -- last visit 2018  - referred back here for cataract clearance  - BCVA 20/50 OU (down from 20/40 OD and 20/20 OS in Apr 2018) -- mostly from cataract progression  - exam shows right angle vessels and RPE mottling; no refractile deposits  - OCT shows OD: Interval increase in central cystic changes and ellipsoid disruption / ORA; interval blunting of foveal contour; OS: Interval development of central cystic changes -- ?VMT component  - FA (08.14.23) shows Focal early staining and late leakage just temporal to fovea OU, no MA OU  - discussed findings, prognosis and experimental treatments and ongoing clinical trials  - discussed association of serine and lipid metabolism with disease  - no intervention indicated at this time, recommend monitoring   - f/u 37yr sooner prn -- DFE,  OCT  2. Diabetes mellitus, type 2 without retinopathy - The incidence, risk factors for progression, natural history and treatment options for diabetic retinopathy  were discussed with patient.   - The need for close monitoring of blood glucose, blood pressure, and serum lipids, avoiding cigarette or any type of tobacco, and the need for long term follow up was also discussed with patient. - OCT - no DME - FA 08.14.23 -- no MA OU - f/u in 1 year, sooner prn  3,4. Hypertensive retinopathy OU - discussed importance of tight BP control - monitor  5. Mixed Cataract OU - The symptoms of cataract, surgical options, and treatments and risks were discussed with patient. - discussed diagnosis and progression - pt is scheduled for surgery with Dr. WMarisa Huanext month  6. Ocular hypertension  - IOP today: 22,21  - will start brimonidine BID OU  - discussed possible neuroprotective effect of brimonidine for Mac Tel 2   Ophthalmic Meds Ordered this visit:  Meds ordered this encounter  Medications   brimonidine (ALPHAGAN) 0.2 % ophthalmic solution    Sig:  Place 1 drop into both eyes 2 (two) times daily.    Dispense:  10 mL    Refill:  3     Return in about 1 year (around 05/15/2023) for f/u 1 year, mac tel 2, DFE, OCT.  There are no Patient Instructions on file for this visit.   Explained the diagnoses, plan, and follow up with the patient and they expressed understanding.  Patient expressed understanding of the importance of proper follow up care.   This document serves as a record of services personally performed by Gardiner Sleeper, MD, PhD. It was created on their behalf by Roselee Nova, COMT. The creation of this record is the provider's dictation and/or activities during the visit.  Electronically signed by: Roselee Nova, COMT 05/14/22 1:07 PM   Gardiner Sleeper, M.D., Ph.D. Diseases & Surgery of the Retina and Vitreous [facility]  I have reviewed the  above documentation for accuracy and completeness, and I agree with the above. Gardiner Sleeper, M.D., Ph.D. 05/14/22 1:07 PM  Abbreviations: M myopia (nearsighted); A astigmatism; H hyperopia (farsighted); P presbyopia; Mrx spectacle prescription;  CTL contact lenses; OD right eye; OS left eye; OU both eyes  XT exotropia; ET esotropia; PEK punctate epithelial keratitis; PEE punctate epithelial erosions; DES dry eye syndrome; MGD meibomian gland dysfunction; ATs artificial tears; PFAT's preservative free artificial tears; [place] nuclear sclerotic cataract; PSC posterior subcapsular cataract; ERM epi-retinal membrane; PVD posterior vitreous detachment; RD retinal detachment; DM diabetes mellitus; DR diabetic retinopathy; NPDR non-proliferative diabetic retinopathy; PDR proliferative diabetic retinopathy; CSME clinically significant macular edema; DME diabetic macular edema; dbh dot blot hemorrhages; CWS cotton wool spot; POAG primary open angle glaucoma; C/D cup-to-disc ratio; HVF humphrey visual field; GVF goldmann visual field; OCT optical coherence tomography; IOP intraocular pressure; BRVO Branch retinal vein occlusion; CRVO central retinal vein occlusion; CRAO central retinal artery occlusion; BRAO branch retinal artery occlusion; RT retinal tear; SB scleral buckle; PPV pars plana vitrectomy; VH Vitreous hemorrhage; PRP panretinal laser photocoagulation; IVK intravitreal kenalog; VMT vitreomacular traction; MH Macular hole;  NVD neovascularization of the disc; NVE neovascularization elsewhere; AREDS age related eye disease study; ARMD age related macular degeneration; POAG primary open angle glaucoma; EBMD epithelial/anterior basement membrane dystrophy; ACIOL anterior chamber intraocular lens; IOL intraocular lens; PCIOL posterior chamber intraocular lens; Phaco/IOL phacoemulsification with intraocular lens placement; [place] photorefractive keratectomy; LASIK laser assisted in situ keratomileusis; HTN hypertension;  DM diabetes mellitus; COPD chronic obstructive pulmonary disease

## 2022-05-09 NOTE — Progress Notes (Signed)
PROVIDER NOTE: Interpretation of information contained herein should be left to medically-trained personnel. Specific patient instructions are provided elsewhere under "Patient Instructions" section of medical Black. This document was created in part using STT-dictation technology, any transcriptional errors that may result from this process are unintentional.  Patient: Bradley Black Type: Established DOB: [birth year] MRN: [id number] PCP: Susy Frizzle, MD  Service: Procedure DOS: 05/09/2022 Setting: Ambulatory Location: Ambulatory outpatient facility Delivery: Face-to-face Provider: Gillis Santa, MD Specialty: Interventional Pain Management Specialty designation: 09 Location: Outpatient facility Ref. Prov.: Gillis Santa, MD    Primary Reason for Visit: Interventional Pain Management Treatment. CC: Back Pain (Mid back pain, thoracic)   Procedure #1:    [Reference CPT: 64461(single); 64462(additional levels)]   Type: Thoracic facet medial branch block #1  Laterality: Bilateral (-50)  Level: T7, T8, and T9 Medial Branch Level(s)  Imaging: Fluoroscopy-guided         Anesthesia: Local anesthesia (1-2% Lidocaine) Anxiolysis: None                 DOS: 05/09/2022  Performed by: Gillis Santa, MD  Purpose: Diagnostic/Therapeutic Indications: Thoracic back pain severe enough to impact quality of life or function. Rationale (medical necessity): procedure needed and proper for the diagnosis and/or treatment of Bradley Black medical symptoms and needs. 1. Thoracic facet joint syndrome (moderate T7-T8)   2. Thoracic disc herniation   3. Chronic thoracic spine pain    NAS-11 Pain score:   Pre-procedure: 4 /10   Post-procedure: 0-No pain/10     Target: Thoracic posterior (dorsal) primary rami nerve Location: 2.5 cm lateral to midline (spinous process), just cephalad to upper transverse process edge.  (needle tip placement) Region: Thoracic  Approach: Paravertebral  Type of procedure:  Percutaneous perineural nerve block   Pertinent Anatomy: There are two potential fascial compartments in the TPVS: the anterior extrapleural paravertebral compartment and the posterior subendothoracic paravertebral compartment. The transverse process and the superior costotransverse ligament form the posterior boundary.  Position / Prep / Materials:  Position: Prone  Prep solution: DuraPrep (Iodine Povacrylex [0.7% available iodine] and Isopropyl Alcohol, 74% w/w) Prep Area: Entire upper back region Materials:  Tray: Block Needle(s):  Type: Spinal  Gauge (G): 22  Length: 3.5-in  Qty: 3  Pre-op H&P Assessment:  Bradley Black is a 76 y.o. (year old), male patient, seen today for interventional treatment. 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 Bradley Black has a current medication list which includes the following prescription(s): aspirin ec, vitamin d3, citalopram, desvenlafaxine, diltiazem, doxazosin, entresto, furosemide, hydralazine, levothyroxine, multivitamin, pantoprazole, pravastatin, and torsemide, and the following Facility-Administered Medications: dexamethasone, dexamethasone, lidocaine, ropivacaine (pf) 2 mg/ml (0.2%), and  ropivacaine (pf) 2 mg/ml (0.2%). His primarily concern today is the Back Pain (Mid back pain, thoracic)  Initial Vital Signs:  Pulse/HCG Rate: 64  Temp: 98.4 F (36.9 C) Resp: 16 BP: (!) 164/76 SpO2: 100 %  BMI: Estimated body mass index is 21.29 kg/m as calculated from the following:  Height as of this encounter: '5\' 8"'$  (1.727 m).   Weight as of this encounter: 140 lb (63.5 kg).  Risk Assessment: Allergies: Reviewed. He has No Known Allergies.  Allergy Precautions: None required Coagulopathies: Reviewed. None identified.  Blood-thinner therapy: None at this time Active Infection(s): Reviewed. None identified. Bradley Black is afebrile  Site Confirmation: Bradley Black was asked to confirm the procedure and laterality before marking the site Procedure checklist: Completed Consent: Before the procedure and under the influence of no sedative(s), amnesic(s), or anxiolytics, the patient was informed of the treatment options, risks and possible complications. To fulfill our ethical and legal obligations, as recommended by the American Medical Association's Code of Ethics, I have informed the patient of my clinical impression; the nature and purpose of the treatment or procedure; the risks, benefits, and possible complications of the intervention; the alternatives, including doing nothing; the risk(s) and benefit(s) of the alternative treatment(s) or procedure(s); and the risk(s) and benefit(s) of doing nothing. The patient was provided information about the general risks and possible complications associated with the procedure. These may include, but are not limited to: failure to achieve desired goals, infection, bleeding, organ or nerve damage, allergic reactions, paralysis, and death. In addition, the patient was informed of those risks and complications associated to Spine-related procedures, such as failure to decrease pain; infection (i.e.: Meningitis, epidural or intraspinal abscess); bleeding  (i.e.: epidural hematoma, subarachnoid hemorrhage, or any other type of intraspinal or peri-dural bleeding); organ or nerve damage (i.e.: Any type of peripheral nerve, nerve root, or spinal cord injury) with subsequent damage to sensory, motor, and/or autonomic systems, resulting in permanent pain, numbness, and/or weakness of one or several areas of the body; allergic reactions; (i.e.: anaphylactic reaction); and/or death. Furthermore, the patient was informed of those risks and complications associated with the medications. These include, but are not limited to: allergic reactions (i.e.: anaphylactic or anaphylactoid reaction(s)); adrenal axis suppression; blood sugar elevation that in diabetics may result in ketoacidosis or comma; water retention that in patients with history of congestive heart failure may result in shortness of breath, pulmonary edema, and decompensation with resultant heart failure; weight gain; swelling or edema; medication-induced neural toxicity; particulate matter embolism and blood vessel occlusion with resultant organ, and/or nervous system infarction; and/or aseptic necrosis of one or more joints. Finally, the patient was informed that Medicine is not an exact science; therefore, there is also the possibility of unforeseen or unpredictable risks and/or possible complications that may result in a catastrophic outcome. The patient indicated having understood very clearly. We have given the patient no guarantees and we have made no promises. Enough time was given to the patient to ask questions, all of which were answered to the patient's satisfaction. Bradley Black has indicated that he wanted to continue with the procedure. Attestation: I, the ordering provider, attest that I have discussed with the patient the benefits, risks, side-effects, alternatives, likelihood of achieving goals, and potential problems during recovery for the procedure that I have provided informed consent. Date   Time: 05/09/2022 11:25 AM  Pre-Procedure Preparation:  Monitoring: As per clinic protocol. Respiration, ETCO2, SpO2, BP, heart rate and rhythm monitor placed and checked for adequate function Safety Precautions: Patient was assessed for positional comfort and pressure points before starting the procedure. Time-out: I initiated and conducted the "Time-out" before starting the procedure, as per protocol. The patient was asked to participate by confirming the accuracy of the "Time Out" information. Verification of the correct person, site, and procedure  were performed and confirmed by me, the nursing staff, and the patient. "Time-out" conducted as per Joint Commission's Universal Protocol ([email or web address].01.01.01). Time: 1152  Description/Narrative of Procedure:          Technical description of procedure:  Rationale (medical necessity): procedure needed and proper for the diagnosis and/or treatment of the patient's medical symptoms and needs. Procedural Technique Safety Precautions: Aspiration looking for blood return was conducted prior to all injections. At no point did we inject any substances, as a needle was being advanced. No attempts were made at seeking any paresthesias. Safe injection practices and needle disposal techniques used. Medications properly checked for expiration dates. SDV (single dose vial) medications used. Target Area: For the thoracic medial branch nerve, the target is located between the top of the transverse processes at the point where the transverse process joints the vertebra and the superior-lateral aspect of the transverse process.  (More lateral towards the superior aspect of the thoracic spine.) For safety reasons and to minimize the risk of hemo- or pneumothorax, the needle tip was not advanced past the boundary of the posterior paravertebral compartment (superior costotransverse ligament). Description of the Procedure: Protocol guidelines were followed. The patient was placed in  position over the fluoroscopy table. The target area was identified and the area prepped in the usual manner. Skin & deeper tissues infiltrated with local anesthetic. Appropriate amount of time allowed to pass for local anesthetics to take effect. The procedure needles were then advanced to the target area. Proper needle placement secured. Negative aspiration confirmed. Solution injected in intermittent fashion, asking for systemic symptoms every 0.5cc of injectate. The needles were then removed and the area cleansed, making sure to leave some of the prepping solution back to take advantage of its long term bactericidal properties.          Vitals:   05/09/22 1147 05/09/22 1152 05/09/22 1158 05/09/22 1202  BP: (!) 174/81 (!) 175/98 (!) 176/84 (!) 181/82  Pulse: (!) 58 (!) 54 (!) 58 (!) 57  Resp:  '14 15 16  '$ Temp:      TempSrc:      SpO2: 99% 100% 98% 99%  Weight:      Height:         12 cc solution made of 10 cc of 0.2% ropivacaine, 2 cc of Decadron 10 mg/cc.  2 cc injected at each level above bilaterally for the T7,8,9 MB nerves.   Start Time: 1152 hrs. End Time: 1157 hrs.  Imaging Guidance:          Type of Imaging Technique: Fluoroscopy Guidance (Spinal) Indication(s): Assistance in needle guidance and placement for procedures requiring needle placement in or near specific anatomical locations not easily accessible without such assistance. Exposure Time: Please see nurses notes. Contrast: None used. Fluoroscopic Guidance: I was personally present during the use of fluoroscopy. "Tunnel Vision Technique" used to obtain the best possible view of the target area. Parallax error corrected before commencing the procedure. "Direction-depth-direction" technique used to introduce the needle under continuous pulsed fluoroscopy. Once target was reached, antero-posterior, oblique, and lateral fluoroscopic projection used confirm needle placement in all planes. Images permanently stored in  EMR. Ultrasound Guidance: N/A Interpretation: N/A  Post-operative Assessment:  Post-procedure Vital Signs:  Pulse/HCG Rate: (!) 57 (sb)  Temp: 98.4 F (36.9 C) Resp: 16 BP: (!) 181/82 SpO2: 99 %  EBL: None  Complications: No immediate post-treatment complications observed by team, or reported by patient.  Note: The patient tolerated the entire procedure well.  A repeat set of vitals were taken after the procedure and the patient was kept under observation following institutional policy, for this type of procedure. Post-procedural neurological assessment was performed, showing return to baseline, prior to discharge. The patient was provided with post-procedure discharge instructions, including a section on how to identify potential problems. Should any problems arise concerning this procedure, the patient was given instructions to immediately contact us, at any time, without hesitation. In any case, we plan to contact the patient by telephone for a follow-up status report regarding this interventional procedure.  Comments:  No additional relevant information.  Plan of Care    Medications ordered for procedure: Meds ordered this encounter  Medications   lidocaine (XYLOCAINE) 2 % (with pres) injection 400 mg   dexamethasone (DECADRON) injection 10 mg   dexamethasone (DECADRON) injection 10 mg   ropivacaine (PF) 2 mg/mL (0.2%) (NAROPIN) injection 9 mL   ropivacaine (PF) 2 mg/mL (0.2%) (NAROPIN) injection 9 mL   Medications administered: Bubber L. Verdell had no medications administered during this visit.  See the medical Black for exact dosing, route, and time of administration.  Follow-up plan:   Return in about 4 weeks (around 06/06/2022) for Post Procedure Evaluation, virtual.       B/L T7,8,9 MBNB #1 05/09/22   Recent Visits Date Type Provider Dept  05/02/22 Office Visit Gillis Santa, MD [facility]  Showing recent visits within past 90 days and meeting all other  requirements Today's Visits Date Type Provider Dept  05/09/22 Procedure visit Gillis Santa, MD [facility]  Showing today's visits and meeting all other requirements Future Appointments Date Type Provider Dept  05/29/22 Appointment Gillis Santa, MD [facility]  Showing future appointments within next 90 days and meeting all other requirements  Disposition: Discharge home  Discharge (Date  Time): 05/09/2022; 1204 hrs.   Primary Care Physician: Susy Frizzle, MD Location: [facility] Note by: Gillis Santa, MD Date: 05/09/2022; Time: 12:16 PM  Disclaimer:  Medicine is not an exact science. The only guarantee in medicine is that nothing is guaranteed. It is important to note that the decision to proceed with this intervention was based on the information collected from the patient. The Data and conclusions were drawn from the patient's questionnaire, the interview, and the physical examination. Because the information was provided in large part by the patient, it cannot be guaranteed that it has not been purposely or unconsciously manipulated. Every effort has been made to obtain as much relevant data as possible for this evaluation. It is important to note that the conclusions that lead to this procedure are derived in large part from the available data. Always take into account that the treatment will also be dependent on availability of resources and existing treatment guidelines, considered by other Pain Management Practitioners as being common knowledge and practice, at the time of the intervention. For Medico-Legal purposes, it is also important to point out that variation in procedural techniques and pharmacological choices are the acceptable norm. The indications, contraindications, technique, and results of the above procedure should only be interpreted and judged by a Board-Certified Interventional Pain Specialist with extensive familiarity  and expertise in the same exact procedure and technique.

## 2022-05-10 ENCOUNTER — Telehealth: Payer: Self-pay | Admitting: *Deleted

## 2022-05-10 DIAGNOSIS — Z992 Dependence on renal dialysis: Secondary | ICD-10-CM | POA: Diagnosis not present

## 2022-05-10 DIAGNOSIS — N186 End stage renal disease: Secondary | ICD-10-CM | POA: Diagnosis not present

## 2022-05-10 NOTE — Telephone Encounter (Signed)
Post procedure call;  patient reports that he is doing well.

## 2022-05-11 DIAGNOSIS — N186 End stage renal disease: Secondary | ICD-10-CM | POA: Diagnosis not present

## 2022-05-11 DIAGNOSIS — Z992 Dependence on renal dialysis: Secondary | ICD-10-CM | POA: Diagnosis not present

## 2022-05-12 DIAGNOSIS — Z992 Dependence on renal dialysis: Secondary | ICD-10-CM | POA: Diagnosis not present

## 2022-05-12 DIAGNOSIS — N186 End stage renal disease: Secondary | ICD-10-CM | POA: Diagnosis not present

## 2022-05-13 DIAGNOSIS — N186 End stage renal disease: Secondary | ICD-10-CM | POA: Diagnosis not present

## 2022-05-13 DIAGNOSIS — Z992 Dependence on renal dialysis: Secondary | ICD-10-CM | POA: Diagnosis not present

## 2022-05-14 ENCOUNTER — Encounter (INDEPENDENT_AMBULATORY_CARE_PROVIDER_SITE_OTHER): Payer: Self-pay | Admitting: Ophthalmology

## 2022-05-14 ENCOUNTER — Ambulatory Visit (INDEPENDENT_AMBULATORY_CARE_PROVIDER_SITE_OTHER): Payer: Medicare Other | Admitting: Ophthalmology

## 2022-05-14 DIAGNOSIS — I10 Essential (primary) hypertension: Secondary | ICD-10-CM

## 2022-05-14 DIAGNOSIS — H35073 Retinal telangiectasis, bilateral: Secondary | ICD-10-CM

## 2022-05-14 DIAGNOSIS — H25813 Combined forms of age-related cataract, bilateral: Secondary | ICD-10-CM

## 2022-05-14 DIAGNOSIS — H35033 Hypertensive retinopathy, bilateral: Secondary | ICD-10-CM | POA: Diagnosis not present

## 2022-05-14 DIAGNOSIS — E119 Type 2 diabetes mellitus without complications: Secondary | ICD-10-CM

## 2022-05-14 DIAGNOSIS — Z992 Dependence on renal dialysis: Secondary | ICD-10-CM | POA: Diagnosis not present

## 2022-05-14 DIAGNOSIS — H40053 Ocular hypertension, bilateral: Secondary | ICD-10-CM

## 2022-05-14 DIAGNOSIS — N186 End stage renal disease: Secondary | ICD-10-CM | POA: Diagnosis not present

## 2022-05-14 MED ORDER — BRIMONIDINE TARTRATE 0.2 % OP SOLN: 1.0000 [drp] | Freq: Two times a day (BID) | OPHTHALMIC | 3 refills | Status: AC

## 2022-05-15 DIAGNOSIS — N186 End stage renal disease: Secondary | ICD-10-CM | POA: Diagnosis not present

## 2022-05-15 DIAGNOSIS — Z992 Dependence on renal dialysis: Secondary | ICD-10-CM | POA: Diagnosis not present

## 2022-05-16 DIAGNOSIS — N186 End stage renal disease: Secondary | ICD-10-CM | POA: Diagnosis not present

## 2022-05-16 DIAGNOSIS — Z992 Dependence on renal dialysis: Secondary | ICD-10-CM | POA: Diagnosis not present

## 2022-05-17 DIAGNOSIS — N186 End stage renal disease: Secondary | ICD-10-CM | POA: Diagnosis not present

## 2022-05-17 DIAGNOSIS — Z992 Dependence on renal dialysis: Secondary | ICD-10-CM | POA: Diagnosis not present

## 2022-05-18 DIAGNOSIS — Z992 Dependence on renal dialysis: Secondary | ICD-10-CM | POA: Diagnosis not present

## 2022-05-18 DIAGNOSIS — N186 End stage renal disease: Secondary | ICD-10-CM | POA: Diagnosis not present

## 2022-05-19 DIAGNOSIS — N186 End stage renal disease: Secondary | ICD-10-CM | POA: Diagnosis not present

## 2022-05-19 DIAGNOSIS — Z992 Dependence on renal dialysis: Secondary | ICD-10-CM | POA: Diagnosis not present

## 2022-05-20 DIAGNOSIS — N186 End stage renal disease: Secondary | ICD-10-CM | POA: Diagnosis not present

## 2022-05-20 DIAGNOSIS — Z992 Dependence on renal dialysis: Secondary | ICD-10-CM | POA: Diagnosis not present

## 2022-05-21 DIAGNOSIS — N186 End stage renal disease: Secondary | ICD-10-CM | POA: Diagnosis not present

## 2022-05-21 DIAGNOSIS — Z992 Dependence on renal dialysis: Secondary | ICD-10-CM | POA: Diagnosis not present

## 2022-05-22 DIAGNOSIS — Z992 Dependence on renal dialysis: Secondary | ICD-10-CM | POA: Diagnosis not present

## 2022-05-22 DIAGNOSIS — N186 End stage renal disease: Secondary | ICD-10-CM | POA: Diagnosis not present

## 2022-05-23 DIAGNOSIS — N186 End stage renal disease: Secondary | ICD-10-CM | POA: Diagnosis not present

## 2022-05-23 DIAGNOSIS — Z992 Dependence on renal dialysis: Secondary | ICD-10-CM | POA: Diagnosis not present

## 2022-05-24 DIAGNOSIS — N186 End stage renal disease: Secondary | ICD-10-CM | POA: Diagnosis not present

## 2022-05-24 DIAGNOSIS — Z992 Dependence on renal dialysis: Secondary | ICD-10-CM | POA: Diagnosis not present

## 2022-05-25 DIAGNOSIS — N186 End stage renal disease: Secondary | ICD-10-CM | POA: Diagnosis not present

## 2022-05-25 DIAGNOSIS — Z992 Dependence on renal dialysis: Secondary | ICD-10-CM | POA: Diagnosis not present

## 2022-05-26 DIAGNOSIS — Z992 Dependence on renal dialysis: Secondary | ICD-10-CM | POA: Diagnosis not present

## 2022-05-26 DIAGNOSIS — N186 End stage renal disease: Secondary | ICD-10-CM | POA: Diagnosis not present

## 2022-05-27 DIAGNOSIS — N186 End stage renal disease: Secondary | ICD-10-CM | POA: Diagnosis not present

## 2022-05-27 DIAGNOSIS — Z992 Dependence on renal dialysis: Secondary | ICD-10-CM | POA: Diagnosis not present

## 2022-05-27 NOTE — Progress Notes (Deleted)
GI Office Note    Referring Provider: Susy Frizzle, MD Primary Care Physician:  Susy Frizzle, MD Primary Gastroenterologist: ***  Date:  05/27/2022  ID:  [name], DOB Jan 15, 1946, MRN [id number]   Chief Complaint   No chief complaint on file.    History of Present Illness  Bradley Black is a 76 y.o. 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  Last visit March 2023. Taking PPI BID, intermittent dark stools, iron infusions with nephrology, occasional constipation. Reported he was in ESRD with PD dialysis. Scheduled for EGD and colonoscopy, continue PPI BID.      Colonoscopy and EGD April 2023. Colonoscopy with three 4-8 mm polpys in the descending, transverse and ascending Bradley Black, diverticulosis in sigmoid and descending, redundant Bradley Black. EGD with 4 cm broad tongue of salmon colored epithelium near EG junction, no esophagitis, scattered gastric erosions.  Esophageal biopsy confirming barrett's, stomach biopsy with reactive gastropathy. Brannen biopsies consistent with tubular adenomas. Advised to continue PPI  and repeat EGD in 2026. Repeat colonoscopy in 5 years.   Hgb 13.3 on 01/25/22.    Labs May 2023: BUN 68, Cr 3.84, Normal LFTs, Hgb 11.6, MCV 100.3, B12 normal, TSH normal   Today: GERD:   Constipation:   Melena or BRBPR?   Current Outpatient Medications  Medication Sig Dispense Refill   aspirin EC 81 MG tablet Take 81 mg by mouth daily.     brimonidine (ALPHAGAN) 0.2 % ophthalmic solution Place 1 drop into both eyes 2 (two) times daily. 10 mL 3   Cholecalciferol (VITAMIN D3) 25 MCG (1000 UT) CAPS Take 1 capsule (1,000 Units total) by mouth daily at 6 (six) AM. 60 capsule 6   citalopram (CELEXA) 10 MG tablet Take 10 mg by mouth daily.     desvenlafaxine (PRISTIQ) 25 MG 24 hr tablet  TAKE ONE TABLET BY MOUTH DAILY. 90 tablet 0   diltiazem (CARDIZEM CD) 120 MG 24 hr capsule TAKE ONE CAPSULE ('120MG'$  TOTAL) BY MOUTH DAILY 30 capsule 1   doxazosin (CARDURA) 8 MG tablet Take 8 mg by mouth daily.     ENTRESTO 97-103 MG TAKE ONE TABLET BY MOUTH TWICE A DAY 60 tablet 6   furosemide (LASIX) 20 MG tablet Take 20 mg by mouth daily.     hydrALAZINE (APRESOLINE) 50 MG tablet Take 50 mg by mouth 3 (three) times daily.     levothyroxine (SYNTHROID) 50 MCG tablet Take 1 tablet (50 mcg total) by mouth daily before breakfast. 90 tablet 3   multivitamin (RENA-VIT) TABS tablet Take 1 tablet by mouth daily.     pantoprazole (PROTONIX) 40 MG tablet TAKE ONE TABLET BY MOUTH TWICE A DAY 180 tablet 3   pravastatin (PRAVACHOL) 20 MG tablet TAKE ONE TABLET ('20MG'$  TOTAL) BY MOUTH DAILY 90 tablet 3   torsemide (DEMADEX) 20 MG tablet Take 20 mg by mouth daily as needed (swelling).     No current facility-administered medications for this visit.    Past Medical History:  Diagnosis Date   BPH (benign prostatic hyperplasia)    CAD S/P percutaneous coronary angioplasty    a. PTCA of [place] b. PCI with BMS to LAD in 1997 c. RCA PCI [place] d. s/p CABG in 11/2011 with LIMA-LAD, SVG-PDA, SVG-OM2, and  SVG-D1   Cataract    Chronic back pain    CKD (chronic kidney disease), stage III (HCC)    Cyst of bursa    R shoulder   Diabetic retinopathy ([place])    DM (diabetes mellitus), type 2 with renal complications (HCC)    Essential hypertension    Frequent PVCs    GERD (gastroesophageal reflux disease)    Hypertensive retinopathy    Ischemic cardiomyopathy 11/2011   Intra-OP TEE: EF 40-45%, no regional WMA; improved Anterior WM post CABG.   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   LIMA to LAD, SVG to D1, SVG to OM2, SVG to PDA, EVH via right thigh and leg    Past Surgical History:  Procedure Laterality Date   AV FISTULA PLACEMENT Left 07/28/2020    Procedure: LEFT ARM ARTERIOVENOUS (AV) FISTULA  CREATION;  Surgeon: Rosetta Posner, MD;  Location: [facility];  Service: Vascular;  Laterality: Left;   [place]   BIOPSY  01/25/2022   Procedure: BIOPSY;  Surgeon: Daneil Dolin, MD;  [facility]: [facility];  Service: Endoscopy;;   CARDIAC CATHETERIZATION  2013   COLONOSCOPY WITH PROPOFOL N/A 01/25/2022   Procedure: COLONOSCOPY WITH PROPOFOL;  Surgeon: Daneil Dolin, MD;  [facility];  Service: Endoscopy;  Laterality: N/A;  10:30am   CORONARY ANGIOPLASTY  1994   OM   CORONARY ANGIOPLASTY  1997   LAD   CORONARY ANGIOPLASTY WITH STENT PLACEMENT  1999   RCA   CORONARY ANGIOPLASTY WITH STENT PLACEMENT  2001   RCA   CORONARY ARTERY BYPASS GRAFT  12/27/2011   Procedure: CORONARY ARTERY BYPASS GRAFTING (CABG);  Surgeon: Rexene Alberts, MD;  Location: [facility];  Service: Open Heart Surgery;  Laterality: N/A;  Times four. On pump. Using endoscopically harvested right greater saphenous vein and left internal mammary artery.    ESOPHAGOGASTRODUODENOSCOPY (EGD) WITH PROPOFOL N/A 01/25/2022   Procedure: ESOPHAGOGASTRODUODENOSCOPY (EGD) WITH PROPOFOL;  Surgeon: Daneil Dolin, MD;  [facility];  Service: Endoscopy;  Laterality: N/A;   HERNIA REPAIR     INCISION AND DRAINAGE OF WOUND  2006   [place]   INSERTION OF DIALYSIS CATHETER Right 07/25/2020   Procedure: INSERTION OF DIALYSIS CATHETER;  Surgeon: Virl Cagey, MD;  Location: [facility];  Service: General;  Laterality: Right;   INTRAOPERATIVE TRANSESOPHAGEAL ECHOCARDIOGRAM  12/27/2011   Global hypokinesis with EF of 40-45%, improved LAD distribution wall motion.   LEFT HEART CATHETERIZATION WITH CORONARY ANGIOGRAM N/A 12/13/2011   Procedure: LEFT HEART CATHETERIZATION WITH CORONARY ANGIOGRAM;  Surgeon: Leonie Man, MD;  Location: [facility] CATH LAB;  Service: Cardiovascular;  Laterality: N/A;   LUMBAR FUSION     MASS EXCISION  10/24/11   R arm   POLYPECTOMY  01/25/2022    Procedure: POLYPECTOMY;  Surgeon: Daneil Dolin, MD;  [facility];  Service: Endoscopy;;   RIGHT HEART CATH N/A 07/12/2020   Procedure: RIGHT HEART CATH;  Surgeon: Belva Crome, MD;  Location: [facility];  Service: Cardiovascular;  Laterality: N/A;   TRANSESOPHAGEAL ECHOCARDIOGRAM  2013    Family History  Problem Relation Age of Onset   Cancer Mother        breast   Cancer Father        bone   [place]ai cancer Neg Hx     Allergies as of 05/28/2022   (No  Known Allergies)    Social History   Socioeconomic History   Marital status: Married    Spouse name: [name]   Number of children: 1   Years of education: college   Highest education level: Bachelor's degree (e.g., BA, AB, BS)  Occupational History   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  Tobacco Use   Smoking status: Former    Types: Cigarettes    Quit date: 10/01/1986    Years since quitting: 35.6   Smokeless tobacco: Never   Tobacco comments:    quit about 30 yrs ago  Vaping Use   Vaping Use: Never used  Substance and Sexual Activity   Alcohol use: No   Drug use: No   Sexual activity: Not Currently  Other Topics Concern   Not on file  Social History Narrative   Married,  father of one,  grandfather 17. Works out at [employer] at [employer] roughly 2-3 days a week. He works on a treadmill. He does note having a hard time getting his heart rate up.   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   He quit smoking in 1988, and does not drink alcohol.   Social Determinants of Health   Financial Resource Strain: Low Risk  (11/30/2021)   Overall Financial Resource Strain (CARDIA)    Difficulty of Paying Living Expenses: Not hard at all  Food Insecurity: No Food Insecurity (11/30/2021)   Hunger Vital Sign    Worried About Running Out of Food in the Last Year: Never true    Ran Out of Food in the Last Year: Never true  Transportation Needs: No Transportation Needs (11/30/2021)   PRAPARE -  Hydrologist (Medical): No    Lack of Transportation (Non-Medical): No  Physical Activity: Insufficiently Active (11/30/2021)   Exercise Vital Sign    Days of Exercise per Week: 5 days    Minutes of Exercise per Session: 20 min  Stress: No Stress Concern Present (11/30/2021)   [place]    Feeling of Stress : Not at all  Social Connections: [place] (11/30/2021)   Social Connection and Isolation Panel [NHANES]    Frequency of Communication with Friends and Family: More than three times a week    Frequency of Social Gatherings with Friends and Family: More than three times a week    Attends Religious Services: 1 to 4 times per year    Active Member of [employer] or Organizations: No    Attends Music therapist: 1 to 4 times per year    Marital Status: Married     Review of Systems   Gen: Denies fever, chills, anorexia. Denies fatigue, weakness, weight loss.  CV: Denies chest pain, palpitations, syncope, peripheral edema, and claudication. Resp: Denies dyspnea at rest, cough, wheezing, coughing up blood, and pleurisy. GI: See HPI Derm: Denies rash, itching, dry skin Psych: Denies depression, anxiety, memory loss, confusion. No homicidal or suicidal ideation.  Heme: Denies bruising, bleeding, and enlarged lymph nodes.   Physical Exam   There were no vitals taken for this visit.  General:   Alert and oriented. No distress noted. Pleasant and cooperative.  Head:  Normocephalic and atraumatic. Eyes:  Conjuctiva clear without scleral icterus. Mouth:  Oral mucosa pink and moist. Good dentition. No lesions. Lungs:  Clear to auscultation bilaterally. No wheezes, rales, or rhonchi. No distress.  Heart:  S1, S2 present without  murmurs appreciated.  Abdomen:  +BS, soft, non-tender and non-distended. No rebound or guarding. No HSM or masses noted. Rectal: *** Msk:  Symmetrical  without gross deformities. Normal posture. Extremities:  Without edema. Neurologic:  Alert and  oriented x4 Psych:  Alert and cooperative. Normal mood and affect.   Assessment  [place] is a 76 y.o. 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   Anemia: Most recent Hgb in May 11.6 (previously 13.3). Likely ESRD contributing factor. Some gastric erosions on EGD and polyps on colonoscopy.   GERD/Barrett's: EGD in April with reactive gastropathy and biopsy confirmed barrett's. Repeat EGD in 2026.    PLAN   ***     Venetia Night, MSN, FNP-BC, AGACNP-BC [facility]

## 2022-05-28 ENCOUNTER — Emergency Department (HOSPITAL_COMMUNITY): Payer: Medicare Other

## 2022-05-28 ENCOUNTER — Telehealth: Payer: Self-pay | Admitting: Student in an Organized Health Care Education/Training Program

## 2022-05-28 ENCOUNTER — Telehealth: Payer: Self-pay | Admitting: *Deleted

## 2022-05-28 ENCOUNTER — Emergency Department (HOSPITAL_COMMUNITY)
Admission: EM | Admit: 2022-05-28 | Discharge: 2022-05-28 | Disposition: A | Discharge status: Home / Self Care | Payer: Medicare Other | Attending: Emergency Medicine | Admitting: Emergency Medicine

## 2022-05-28 ENCOUNTER — Inpatient Hospital Stay: Payer: Medicare Other | Admitting: Gastroenterology

## 2022-05-28 ENCOUNTER — Other Ambulatory Visit: Payer: Self-pay

## 2022-05-28 ENCOUNTER — Ambulatory Visit: Payer: Self-pay | Admitting: *Deleted

## 2022-05-28 DIAGNOSIS — R0602 Shortness of breath: Secondary | ICD-10-CM | POA: Diagnosis not present

## 2022-05-28 DIAGNOSIS — R000 Tachycardia, unspecified: Secondary | ICD-10-CM | POA: Diagnosis not present

## 2022-05-28 DIAGNOSIS — Z992 Dependence on renal dialysis: Secondary | ICD-10-CM | POA: Diagnosis not present

## 2022-05-28 DIAGNOSIS — U071 COVID-19: Secondary | ICD-10-CM | POA: Diagnosis not present

## 2022-05-28 DIAGNOSIS — I10 Essential (primary) hypertension: Secondary | ICD-10-CM | POA: Diagnosis not present

## 2022-05-28 DIAGNOSIS — Z743 Need for continuous supervision: Secondary | ICD-10-CM | POA: Diagnosis not present

## 2022-05-28 DIAGNOSIS — Z7982 Long term (current) use of aspirin: Secondary | ICD-10-CM | POA: Insufficient documentation

## 2022-05-28 DIAGNOSIS — R5381 Other malaise: Secondary | ICD-10-CM | POA: Diagnosis not present

## 2022-05-28 DIAGNOSIS — R509 Fever, unspecified: Secondary | ICD-10-CM | POA: Diagnosis not present

## 2022-05-28 DIAGNOSIS — N186 End stage renal disease: Secondary | ICD-10-CM | POA: Diagnosis not present

## 2022-05-28 DIAGNOSIS — R531 Weakness: Secondary | ICD-10-CM | POA: Diagnosis not present

## 2022-05-28 LAB — COMPREHENSIVE METABOLIC PANEL
ALT: 31 U/L (ref 0–44)
AST: 28 U/L (ref 15–41)
Albumin: 3.3 g/dL — ABNORMAL LOW (ref 3.5–5.0)
Alkaline Phosphatase: 93 U/L (ref 38–126)
Anion gap: 9 (ref 5–15)
BUN: 53 mg/dL — ABNORMAL HIGH (ref 8–23)
CO2: 19 mmol/L — ABNORMAL LOW (ref 22–32)
Calcium: 8.8 mg/dL — ABNORMAL LOW (ref 8.9–10.3)
Chloride: 107 mmol/L (ref 98–111)
Creatinine, Ser: 3.51 mg/dL — ABNORMAL HIGH (ref 0.61–1.24)
GFR, Estimated: 17 mL/min — ABNORMAL LOW (ref >60)
Glucose, Bld: 131 mg/dL — ABNORMAL HIGH (ref 70–99)
Potassium: 4.6 mmol/L (ref 3.5–5.1)
Sodium: 135 mmol/L (ref 135–145)
Total Bilirubin: 0.9 mg/dL (ref 0.3–1.2)
Total Protein: 6.7 g/dL (ref 6.5–8.1)

## 2022-05-28 LAB — CBC WITH DIFFERENTIAL/PLATELET
Abs Immature Granulocytes: 0.01 10*3/uL (ref 0.00–0.07)
Basophils Absolute: 0 10*3/uL (ref 0.0–0.1)
Basophils Relative: 0 %
Eosinophils Absolute: 0 10*3/uL (ref 0.0–0.5)
Eosinophils Relative: 0 %
HCT: 37.2 % — ABNORMAL LOW (ref 39.0–52.0)
Hemoglobin: 12.4 g/dL — ABNORMAL LOW (ref 13.0–17.0)
Immature Granulocytes: 0 %
Lymphocytes Relative: 10 %
Lymphs Abs: 0.5 10*3/uL — ABNORMAL LOW (ref 0.7–4.0)
MCH: 33.9 pg (ref 26.0–34.0)
MCHC: 33.3 g/dL (ref 30.0–36.0)
MCV: 101.6 fL — ABNORMAL HIGH (ref 80.0–100.0)
Monocytes Absolute: 0.6 10*3/uL (ref 0.1–1.0)
Monocytes Relative: 10 %
Neutro Abs: 4.4 10*3/uL (ref 1.7–7.7)
Neutrophils Relative %: 80 %
Platelets: 135 10*3/uL — ABNORMAL LOW (ref 150–400)
RBC: 3.66 MIL/uL — ABNORMAL LOW (ref 4.22–5.81)
RDW: 12.9 % (ref 11.5–15.5)
WBC: 5.5 10*3/uL (ref 4.0–10.5)
nRBC: 0 % (ref 0.0–0.2)

## 2022-05-28 LAB — LACTIC ACID, PLASMA
Lactic Acid, Venous: 1.4 mmol/L (ref 0.5–1.9)
Lactic Acid, Venous: 1.1 mmol/L (ref 0.5–1.9)

## 2022-05-28 LAB — SARS CORONAVIRUS 2 BY RT PCR: SARS Coronavirus 2 by RT PCR: POSITIVE — AB

## 2022-05-28 LAB — RESP PANEL BY RT-PCR (FLU A&B, COVID) ARPGX2
Influenza A by PCR: NEGATIVE
Influenza B by PCR: NEGATIVE
SARS Coronavirus 2 by RT PCR: POSITIVE — AB

## 2022-05-28 MED ORDER — ALBUTEROL SULFATE HFA 108 (90 BASE) MCG/ACT IN AERS
2.0000 | INHALATION_SPRAY | Freq: Once | RESPIRATORY_TRACT | Status: AC
  Administered 2022-05-28: 2 via RESPIRATORY_TRACT
  Filled 2022-05-28: qty 6.7

## 2022-05-28 MED ORDER — ACETAMINOPHEN 500 MG PO TABS
1000.0000 mg | ORAL_TABLET | Freq: Once | ORAL | Status: DC
  Filled 2022-05-28: qty 2

## 2022-05-28 MED ORDER — AEROCHAMBER PLUS FLO-VU MEDIUM MISC
1.0000 | Freq: Once | Status: AC
  Administered 2022-05-28: 1
  Filled 2022-05-28 (×2): qty 1

## 2022-05-28 MED ORDER — MOLNUPIRAVIR EUA 200MG CAPSULE: 4.0000 | ORAL_CAPSULE | Freq: Two times a day (BID) | ORAL | 0 refills | Status: AC

## 2022-05-28 NOTE — Telephone Encounter (Signed)
  Chief Complaint: Covid positive Symptoms: Cough, wheezing, SOB, Weakness "Can't even get out of bed today." HD pt, "Pulled off a lot of fluid last night, trying to get him to drink." Frequency: Friday, worsening symptoms Pertinent Negatives: Patient denies  Disposition: '[x]'$ ED /'[]'$ Urgent Care (no appt availability in office) / '[]'$ Appointment(In office/virtual)/ '[]'$  [facility]/ '[]'$ Home Care/ '[]'$ Refused Recommended Disposition /'[]'$ [facility]/ '[]'$  Follow-up with PCP Additional Notes: Advised ED, states will call EMS. Reason for Disposition  Patient sounds very sick or weak to the triager  Answer Assessment - Initial Assessment Questions 1. COVID-19 DIAGNOSIS: "How do you know that you have COVID?" (e.g., positive lab test or self-test, diagnosed by doctor or NP/PA, symptoms after exposure).     Home test, today 2. COVID-19 EXPOSURE: "Was there any known exposure to COVID before the symptoms began?" CDC Definition of close contact: within 6 feet (2 meters) for a total of 15 minutes or more over a 24-hour period.      None 3. ONSET: "When did the COVID-19 symptoms start?"      Friday 4. WORST SYMPTOM: "What is your worst symptom?" (e.g., cough, fever, shortness of breath, muscle aches)     Cough 5. COUGH: "Do you have a cough?" If Yes, ask: "How bad is the cough?"       Awake all night, dry 6. FEVER: "Do you have a fever?" If Yes, ask: "What is your temperature, how was it measured, and when did it start?"     100.0. BP 187/78 7. RESPIRATORY STATUS: "Describe your breathing?" (e.g., normal; shortness of breath, wheezing, unable to speak)      Wheezing, SOB 8. BETTER-SAME-WORSE: "Are you getting better, staying the same or getting worse compared to yesterday?"  If getting worse, ask, "In what way?"     Worse 9. OTHER SYMPTOMS: "Do you have any other symptoms?"  (e.g., chills, fatigue, headache, loss of smell or taste, muscle pain, sore throat)     Weakness 10. HIGH RISK  DISEASE: "Do you have any chronic medical problems?" (e.g., asthma, heart or lung disease, weak immune system, obesity, etc.)       Yes 11. VACCINE: "Have you had the COVID-19 vaccine?" If Yes, ask: "Which one, how many shots, when did you get it?"        13. O2 SATURATION MONITOR:  "Do you use an oxygen saturation monitor (pulse oximeter) at home?" If Yes, ask "What is your reading (oxygen level) today?" "What is your usual oxygen saturation reading?" (e.g., 95%)       NA  Protocols used: Coronavirus (COVID-19) Diagnosed or Suspected-A-AH

## 2022-05-28 NOTE — Discharge Instructions (Addendum)
Your testing did confirm that you have COVID 19, thankfully there is no signs of pneumonia on your x-ray, your vital signs have been reassuring with no low oxygen at all.  All of your blood work has also been reassuring.  I would like for you to use the following medications to help with your symptoms  Tylenol or ibuprofen for fevers over 101 degrees, alternate these medications every 4 hours  Albuterol is an inhaled medication which can help you to breathe better, you should take 2 puffs every 4 hours as needed, this may cause your heart to feel like it is racing, this should be a temporary side effect.  Molnupiravir is a antiviral medication that has been used for COVID-19, take 4 tablets by mouth twice a day for the next 5 days  You will need to consider yourself contagious to others until you are no longer coughing or having fevers for up to 24 hours.  This is generally 1 week since the onset of symptoms  Thank you for allowing [place] to treat you in the emergency department today.  After reviewing your examination and potential testing that was done it appears that you are safe to go home.  I would like for you to follow-up with your doctor within the next several days, have them obtain your results and follow-up with them to review all of these tests.  If you should develop severe or worsening symptoms return to the emergency department immediately  Even if you are feeling well please have your family doctor recheck you within 5 days

## 2022-05-28 NOTE — ED Triage Notes (Addendum)
Per EMS patient is [facility] with complaints of generalized weakness, fever, generalized bodyaches that started this past Friday. Reports home test was positive for Covid. Sats 100% on RA. EMS administered one Albuterol treatment PTA. Complaining of upper back pain.

## 2022-05-28 NOTE — Telephone Encounter (Signed)
Please cancel VV for tomorrow. He is sick. Will call when ready to reschedule.

## 2022-05-28 NOTE — Telephone Encounter (Signed)
Attempted to call for pre appointment review of allergies/meds. Spoke with patient's wife, he cannot come to the phone right now. Asked to call again this afternoon.

## 2022-05-28 NOTE — ED Provider Notes (Signed)
[facility] EMERGENCY DEPARTMENT Provider Note   CSN: [id number] Arrival date & time: 05/28/22  1054     History  Chief Complaint  Patient presents with   Shortness of Breath    Bradley Black is a 76 y.o. male.   Shortness of Breath  Patient is a 76 year old male, he has a history of end-stage renal disease, he had been on dialysis by a hemodialysis catheter for 2 years and then switched over to peritoneal dialysis which she has used for the last year.  He also has hypertension, high cholesterol and still makes a small amount of urine.  The patient reports that about 2 days ago he developed increasing fever, sore throat, cough and wheezing, he has had some increasing shortness of breath and was so weak that he could not get out of bed this morning.  He does report some generalized myalgias that started over the last 2 days as well.  He has no vomiting or diarrhea, no swelling of the legs, he lives with his wife who is recently had hip surgery and is unable to help him.  His daughter brings him to the hospital because of the increasing weakness and viral syndrome.  She reports that she did administer a home COVID test this morning which was positive.  The patient has had a prior coronary bypass about 10 years ago, he does not have any pre-existing lung disease    Home Medications Prior to Admission medications   Medication Sig Start Date End Date Taking? Authorizing Provider  molnupiravir EUA (LAGEVRIO) 200 mg CAPS capsule Take 4 capsules (800 mg total) by mouth 2 (two) times daily for 5 days. 05/28/22 06/02/22 Yes Noemi Chapel, MD  aspirin EC 81 MG tablet Take 81 mg by mouth daily.    [provider]  brimonidine (ALPHAGAN) 0.2 % ophthalmic solution Place 1 drop into both eyes 2 (two) times daily. 05/14/22 05/14/23  Bernarda Caffey, MD  Cholecalciferol (VITAMIN D3) 25 MCG (1000 UT) CAPS Take 1 capsule (1,000 Units total) by mouth daily at 6 (six) AM. 05/04/21   Verta Ellen., NP   citalopram (CELEXA) 10 MG tablet Take 10 mg by mouth daily.    [provider]  desvenlafaxine (PRISTIQ) 25 MG 24 hr tablet TAKE ONE TABLET BY MOUTH DAILY. 02/02/22   Susy Frizzle, MD  diltiazem (CARDIZEM CD) 120 MG 24 hr capsule TAKE ONE CAPSULE ('120MG'$  TOTAL) BY MOUTH DAILY 02/01/22   Arnoldo Lenis, MD  doxazosin (CARDURA) 8 MG tablet Take 8 mg by mouth daily. 04/25/21   [provider]  ENTRESTO 97-103 MG TAKE ONE TABLET BY MOUTH TWICE A DAY 11/14/21   Arnoldo Lenis, MD  furosemide (LASIX) 20 MG tablet Take 20 mg by mouth daily.    [provider]  hydrALAZINE (APRESOLINE) 50 MG tablet Take 50 mg by mouth 3 (three) times daily. 10/16/21   [provider]  levothyroxine (SYNTHROID) 50 MCG tablet Take 1 tablet (50 mcg total) by mouth daily before breakfast. 10/23/21   Susy Frizzle, MD  multivitamin (RENA-VIT) TABS tablet Take 1 tablet by mouth daily. 01/30/21   [provider]  pantoprazole (PROTONIX) 40 MG tablet TAKE ONE TABLET BY MOUTH TWICE A DAY 01/23/22   Susy Frizzle, MD  pravastatin (PRAVACHOL) 20 MG tablet TAKE ONE TABLET ('20MG'$  TOTAL) BY MOUTH DAILY 12/26/21   Arnoldo Lenis, MD  torsemide (DEMADEX) 20 MG tablet Take 20 mg by mouth daily as  needed (swelling). 03/29/21   [provider]      Allergies    Patient has no known allergies.    Review of Systems   Review of Systems  Respiratory:  Positive for shortness of breath.   All other systems reviewed and are negative.   Physical Exam Updated Vital Signs BP (!) 157/79   Pulse 69   Temp 98.6 F (37 C) (Oral)   Resp (!) 23   Ht 1.727 m ('5\' 8"'$ )   Wt 63.5 kg   SpO2 96%   BMI 21.29 kg/m  Physical Exam Vitals and nursing note reviewed.  Constitutional:      General: He is not in acute distress.    Appearance: He is well-developed.  HENT:     Head: Normocephalic and atraumatic.     Nose: Nose normal. No congestion or rhinorrhea.     Mouth/Throat:      Mouth: Mucous membranes are moist.     Pharynx: No oropharyngeal exudate.  Eyes:     General: No scleral icterus.       Right eye: No discharge.        Left eye: No discharge.     Conjunctiva/sclera: Conjunctivae normal.     Pupils: Pupils are equal, round, and reactive to light.  Neck:     Thyroid: No thyromegaly.     Vascular: No JVD.  Cardiovascular:     Rate and Rhythm: Regular rhythm. Tachycardia present.     Heart sounds: Murmur heard.     No friction rub. No gallop.     Comments: Soft systolic murmur Pulmonary:     Effort: Pulmonary effort is normal. No respiratory distress.     Breath sounds: Wheezing and rales present.     Comments: Scattered rales, clears with deep breathing, no increased work of breathing, no tachypnea, mild expiratory wheezing Abdominal:     General: Bowel sounds are normal. There is no distension.     Palpations: Abdomen is soft. There is no mass.     Tenderness: There is no abdominal tenderness.     Comments: Nontender, peritoneal dialysis catheter in the right mid abdomen appears to be clean dry and intact without redness or drainage  Musculoskeletal:        General: No tenderness. Normal range of motion.     Cervical back: Normal range of motion and neck supple.     Right lower leg: No edema.     Left lower leg: No edema.  Lymphadenopathy:     Cervical: No cervical adenopathy.  Skin:    General: Skin is warm and dry.     Findings: No erythema or rash.  Neurological:     Mental Status: He is alert.     Coordination: Coordination normal.  Psychiatric:        Behavior: Behavior normal.     ED Results / Procedures / Treatments   Labs (all labs ordered are listed, but only abnormal results are displayed) Labs Reviewed  RESP PANEL BY RT-PCR (FLU A&B, COVID) ARPGX2 - Abnormal; Notable for the following components:      Result Value   SARS Coronavirus 2 by RT PCR POSITIVE (*)    All other components within normal limits  CBC WITH  DIFFERENTIAL/PLATELET - Abnormal; Notable for the following components:   RBC 3.66 (*)    Hemoglobin 12.4 (*)    HCT 37.2 (*)    MCV 101.6 (*)    Platelets 135 (*)  Lymphs Abs 0.5 (*)    All other components within normal limits  COMPREHENSIVE METABOLIC PANEL - Abnormal; Notable for the following components:   CO2 19 (*)    Glucose, Bld 131 (*)    BUN 53 (*)    Creatinine, Ser 3.51 (*)    Calcium 8.8 (*)    Albumin 3.3 (*)    GFR, Estimated 17 (*)    All other components within normal limits  SARS CORONAVIRUS 2 BY RT PCR  CULTURE, BLOOD (ROUTINE X 2)  CULTURE, BLOOD (ROUTINE X 2)  LACTIC ACID, PLASMA  LACTIC ACID, PLASMA    EKG EKG Interpretation  Date/Time:  Monday May 28 2022 11:15:46 EDT Ventricular Rate:  95 PR Interval:  150 QRS Duration: 116 QT Interval:  368 QTC Calculation: 462 R Axis:   70 Text Interpretation: Normal sinus rhythm Possible Left atrial enlargement Inferior infarct , age undetermined Anterolateral infarct , age undetermined Abnormal ECG When compared with ECG of 03-Dec-2020 14:11, PREVIOUS ECG IS PRESENT Since last tracing rate faster Confirmed by Noemi Chapel [phone]) on 05/28/2022 11:41:39 AM  Radiology DG Chest Port 1 View  Result Date: 05/28/2022 CLINICAL DATA:  Shortness of breath and generalized weakness with fever. Body aches. Positive COVID test. EXAM: PORTABLE CHEST 1 VIEW COMPARISON:  Chest radiograph 12/03/2020 FINDINGS: Median sternotomy wires are again seen. The cardiomediastinal silhouette is stable. There is no focal consolidation or pulmonary edema. There is no pleural effusion or pneumothorax There is no acute osseous abnormality. IMPRESSION: No radiographic evidence of acute cardiopulmonary process. Electronically Signed   By: Valetta Mole M.D.   On: 05/28/2022 12:07    Procedures Procedures    Medications Ordered in ED Medications  acetaminophen (TYLENOL) tablet 1,000 mg (0 mg Oral Hold 05/28/22 1251)  albuterol (VENTOLIN  HFA) 108 (90 Base) MCG/ACT inhaler 2 puff (2 puffs Inhalation Given 05/28/22 1251)  AeroChamber Plus Flo-Vu Medium MISC 1 each (1 each Other Given 05/28/22 1251)    ED Course/ Medical Decision Making/ A&P                           Medical Decision Making Amount and/or Complexity of Data Reviewed Labs: ordered. Radiology: ordered. ECG/medicine tests: ordered.  Risk OTC drugs. Prescription drug management.   This patient presents to the ED for concern of possible COVID infection, pneumonia or other viral syndrome, this involves an extensive number of treatment options, and is a complaint that carries with it a high risk of complications and morbidity.  The differential diagnosis includes COVID, pneumonia, sepsis   Co morbidities that complicate the patient evaluation  End-stage renal disease on peritoneal dialysis Coronary bypass grafting Hypertension   Additional history obtained:  Additional history obtained from electronic medical record External records from outside source obtained and reviewed including multiple office visits, no recent admissions to the hospital, follows with spine, follows with primary care doctor, follows with cardiology   Lab Tests:  I Ordered, and personally interpreted labs.  The pertinent results include: COVID-19, positive Lactic acid 1.1, CBC shows no leukocytosis, no significant anemia, slight thrombocytopenia Metabolic panel with creatinine of 3.5, this is his baseline, BUN of 53, electrolytes unremarkable and normal   Imaging Studies ordered:  I ordered imaging studies including portable chest x-ray I independently visualized and interpreted imaging which showed no pneumonia, no pleural effusions, no pulmonary edema, no cardiomegaly of any concern I agree with the radiologist interpretation   Cardiac Monitoring: /  EKG:  The patient was maintained on a cardiac monitor.  I personally viewed and interpreted the cardiac monitored which  showed an underlying rhythm of: Normal sinus rhythm, no tachycardia, occasional PVC but no other arrhythmia or ischemic findings   Consultations Obtained:  No consultations needed.   Problem List / ED Course / Critical interventions / Medication management  The patient was afebrile, heart rate of 70, blood pressure of 157/79 and an oxygen of 100% at the time of discharge, there is no supplemental oxygen that is needed at this time, he was given an albuterol inhaler with an MDI spacer and did very well I ordered medication including as above for COVID-19 Reevaluation of the patient after these medicines showed that the patient improved and stable I have reviewed the patients home medicines and have made adjustments as needed   Social Determinants of Health:  Elderly   Test / Admission - Considered:  Considered mission but the patient was agreeable to being discharged home since he had no oxygen requirement and had a family member (daughter) who is willing to help out at home.  The patient was given an albuterol inhaler for home as well as a course of molnupiravir Patient agreeable to return should symptoms worsen, no vomiting at this time  I have discussed with the patient at the bedside the results, and the meaning of these results.  They have expressed her understanding to the need for follow-up with primary care physician        Final Clinical Impression(s) / ED Diagnoses Final diagnoses:  COVID-19    Rx / DC Orders ED Discharge Orders          Ordered    molnupiravir EUA (LAGEVRIO) 200 mg CAPS capsule  2 times daily        05/28/22 1441              Noemi Chapel, MD 05/28/22 1446

## 2022-05-28 NOTE — ED Notes (Signed)
Took tylenol '1000mg'$  at 0930, PTA at home.

## 2022-05-28 NOTE — Telephone Encounter (Signed)
Wife stated that PT is in hospital. Wife wanted to notify doctor , due to patient having an VV appt on tomorrow. Wife said if you need to get in touch with her please call [phone]. Thanks

## 2022-05-29 ENCOUNTER — Ambulatory Visit: Payer: Medicare Other | Admitting: Student in an Organized Health Care Education/Training Program

## 2022-05-29 DIAGNOSIS — Z992 Dependence on renal dialysis: Secondary | ICD-10-CM | POA: Diagnosis not present

## 2022-05-29 DIAGNOSIS — N186 End stage renal disease: Secondary | ICD-10-CM | POA: Diagnosis not present

## 2022-05-30 DIAGNOSIS — Z992 Dependence on renal dialysis: Secondary | ICD-10-CM | POA: Diagnosis not present

## 2022-05-30 DIAGNOSIS — N186 End stage renal disease: Secondary | ICD-10-CM | POA: Diagnosis not present

## 2022-05-31 DIAGNOSIS — N186 End stage renal disease: Secondary | ICD-10-CM | POA: Diagnosis not present

## 2022-05-31 DIAGNOSIS — Z992 Dependence on renal dialysis: Secondary | ICD-10-CM | POA: Diagnosis not present

## 2022-06-01 DIAGNOSIS — Z992 Dependence on renal dialysis: Secondary | ICD-10-CM | POA: Diagnosis not present

## 2022-06-01 DIAGNOSIS — N186 End stage renal disease: Secondary | ICD-10-CM | POA: Diagnosis not present

## 2022-06-02 DIAGNOSIS — N186 End stage renal disease: Secondary | ICD-10-CM | POA: Diagnosis not present

## 2022-06-02 DIAGNOSIS — Z992 Dependence on renal dialysis: Secondary | ICD-10-CM | POA: Diagnosis not present

## 2022-06-02 LAB — CULTURE, BLOOD (ROUTINE X 2)
Culture: NO GROWTH
Culture: NO GROWTH
Special Requests: ADEQUATE
Special Requests: ADEQUATE

## 2022-06-03 DIAGNOSIS — N186 End stage renal disease: Secondary | ICD-10-CM | POA: Diagnosis not present

## 2022-06-03 DIAGNOSIS — Z992 Dependence on renal dialysis: Secondary | ICD-10-CM | POA: Diagnosis not present

## 2022-06-04 DIAGNOSIS — N186 End stage renal disease: Secondary | ICD-10-CM | POA: Diagnosis not present

## 2022-06-04 DIAGNOSIS — Z992 Dependence on renal dialysis: Secondary | ICD-10-CM | POA: Diagnosis not present

## 2022-06-05 ENCOUNTER — Telehealth: Payer: Self-pay

## 2022-06-05 ENCOUNTER — Other Ambulatory Visit: Payer: Self-pay | Admitting: Family Medicine

## 2022-06-05 DIAGNOSIS — Z992 Dependence on renal dialysis: Secondary | ICD-10-CM | POA: Diagnosis not present

## 2022-06-05 DIAGNOSIS — N186 End stage renal disease: Secondary | ICD-10-CM | POA: Diagnosis not present

## 2022-06-05 MED ORDER — CEPHALEXIN 500 MG PO CAPS: 500.0000 mg | ORAL_CAPSULE | Freq: Three times a day (TID) | ORAL | 0 refills | Status: DC

## 2022-06-05 NOTE — Telephone Encounter (Signed)
Spoke with pt's wife, [name], who states pt is getting over Covid but now possibly has UTI. Pt c/o painful urination and frequency. [name] asks if there is anyway an prescription could be called in for patient. Mrs. Marland and their daughter both have Covid and can't bring patient in. Thank you.

## 2022-06-05 NOTE — Telephone Encounter (Signed)
Pt's wife called in stating that pt has recently recovered from Covid, but pt is now experiencing some urination issues. Pt's wife states that he has an urgency to go, but only urinates very little. Pt would like a cb as to what they could do, or if they need to be seen.  Cb#: [phone]

## 2022-06-06 DIAGNOSIS — Z992 Dependence on renal dialysis: Secondary | ICD-10-CM | POA: Diagnosis not present

## 2022-06-06 DIAGNOSIS — N186 End stage renal disease: Secondary | ICD-10-CM | POA: Diagnosis not present

## 2022-06-07 DIAGNOSIS — Z992 Dependence on renal dialysis: Secondary | ICD-10-CM | POA: Diagnosis not present

## 2022-06-07 DIAGNOSIS — N186 End stage renal disease: Secondary | ICD-10-CM | POA: Diagnosis not present

## 2022-06-08 DIAGNOSIS — N186 End stage renal disease: Secondary | ICD-10-CM | POA: Diagnosis not present

## 2022-06-08 DIAGNOSIS — Z992 Dependence on renal dialysis: Secondary | ICD-10-CM | POA: Diagnosis not present

## 2022-06-09 DIAGNOSIS — Z992 Dependence on renal dialysis: Secondary | ICD-10-CM | POA: Diagnosis not present

## 2022-06-09 DIAGNOSIS — N186 End stage renal disease: Secondary | ICD-10-CM | POA: Diagnosis not present

## 2022-06-10 DIAGNOSIS — Z992 Dependence on renal dialysis: Secondary | ICD-10-CM | POA: Diagnosis not present

## 2022-06-10 DIAGNOSIS — N186 End stage renal disease: Secondary | ICD-10-CM | POA: Diagnosis not present

## 2022-06-11 DIAGNOSIS — N186 End stage renal disease: Secondary | ICD-10-CM | POA: Diagnosis not present

## 2022-06-11 DIAGNOSIS — Z992 Dependence on renal dialysis: Secondary | ICD-10-CM | POA: Diagnosis not present

## 2022-06-12 ENCOUNTER — Ambulatory Visit (INDEPENDENT_AMBULATORY_CARE_PROVIDER_SITE_OTHER): Payer: Medicare Other | Admitting: Family Medicine

## 2022-06-12 VITALS — BP 132/64 | HR 77 | Temp 98.1°F | Ht 68.0 in | Wt 138.6 lb

## 2022-06-12 DIAGNOSIS — Z992 Dependence on renal dialysis: Secondary | ICD-10-CM | POA: Diagnosis not present

## 2022-06-12 DIAGNOSIS — N410 Acute prostatitis: Secondary | ICD-10-CM | POA: Diagnosis not present

## 2022-06-12 DIAGNOSIS — N186 End stage renal disease: Secondary | ICD-10-CM | POA: Diagnosis not present

## 2022-06-12 LAB — URINALYSIS, ROUTINE W REFLEX MICROSCOPIC
Bilirubin Urine: NEGATIVE
Glucose, UA: NEGATIVE
Hgb urine dipstick: NEGATIVE
Hyaline Cast: NONE SEEN /LPF
Ketones, ur: NEGATIVE
Leukocytes,Ua: NEGATIVE
Nitrite: NEGATIVE
RBC / HPF: NONE SEEN /HPF (ref 0–2)
Specific Gravity, Urine: 1.025 (ref 1.001–1.035)
pH: 5.5 (ref 5.0–8.0)

## 2022-06-12 LAB — MICROSCOPIC MESSAGE

## 2022-06-12 MED ORDER — CIPROFLOXACIN HCL 500 MG PO TABS: 500.0000 mg | ORAL_TABLET | Freq: Every day | ORAL | 0 refills | Status: AC

## 2022-06-12 MED ORDER — SILDENAFIL CITRATE 100 MG PO TABS: 50.0000 mg | ORAL_TABLET | Freq: Every day | ORAL | 1 refills | Status: DC | PRN

## 2022-06-12 NOTE — Progress Notes (Signed)
Subjective:    Patient ID: Bradley Black, male    DOB: 06-16-1946, 76 y.o.   MRN: [id number] Patient is a very pleasant 76 year old Caucasian gentleman here today complaining of a urinary tract infection.  Urinalysis is completely normal.  There is no blood, there is no nitrates, there is no leukocyte esterase.  I treated the patient empirically with Keflex 1 week ago.  He states that symptoms or not improving.  He reports pressure.  He reports dysuria.  He is on peritoneal dialysis which he does every evening at home.  He still makes urine.  He states that when he goes to urinate he feels burning pain.  He also feels pressure.  His bladder is not distended.  His abdomen is soft nontender with normal bowel sounds.  However the patient appears frail and weak.  He denies any hypotensive episodes.  He denies any fever or chills or rigors.  He denies any cough or chest pain.  I performed a prostate exam today.  His prostate is extremely swollen and also very tender to palpation suggesting prostatitis  Past Medical History:  Diagnosis Date   BPH (benign prostatic hyperplasia)    CAD S/P percutaneous coronary angioplasty    a. PTCA of [place] b. PCI with BMS to LAD in 1997 c. RCA PCI [place] d. s/p CABG in 11/2011 with LIMA-LAD, SVG-PDA, SVG-OM2, and SVG-D1   Cataract    Chronic back pain    CKD (chronic kidney disease), stage III (HCC)    Cyst of bursa    R shoulder   Diabetic retinopathy ([place])    DM (diabetes mellitus), type 2 with renal complications (HCC)    Essential hypertension    Frequent PVCs    GERD (gastroesophageal reflux disease)    Hypertensive retinopathy    Ischemic cardiomyopathy 11/2011   Intra-OP TEE: EF 40-45%, no regional WMA; improved Anterior WM post CABG.   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   LIMA to LAD, SVG to D1, SVG to OM2, SVG to PDA, EVH via right thigh and leg   Past Surgical History:  Procedure  Laterality Date   AV FISTULA PLACEMENT Left 07/28/2020   Procedure: LEFT ARM ARTERIOVENOUS (AV) FISTULA  CREATION;  Surgeon: Rosetta Posner, MD;  Location: [facility];  Service: Vascular;  Laterality: Left;   [place]   BIOPSY  01/25/2022   Procedure: BIOPSY;  Surgeon: Daneil Dolin, MD;  Location: [facility];  Service: Endoscopy;;   CARDIAC CATHETERIZATION  2013   COLONOSCOPY WITH PROPOFOL N/A 01/25/2022   Procedure: COLONOSCOPY WITH PROPOFOL;  Surgeon: Daneil Dolin, MD;  Location: [facility];  Service: Endoscopy;  Laterality: N/A;  10:30am   CORONARY ANGIOPLASTY  1994   OM   CORONARY ANGIOPLASTY  1997   LAD   CORONARY ANGIOPLASTY WITH STENT PLACEMENT  1999   RCA   CORONARY ANGIOPLASTY WITH STENT PLACEMENT  2001   RCA   CORONARY ARTERY BYPASS GRAFT  12/27/2011   Procedure: CORONARY ARTERY BYPASS GRAFTING (CABG);  Surgeon: Rexene Alberts, MD;  Location: [facility];  Service: Open Heart Surgery;  Laterality: N/A;  Times four. On pump. Using endoscopically harvested right greater saphenous vein and left internal mammary artery.    ESOPHAGOGASTRODUODENOSCOPY (EGD) WITH PROPOFOL N/A 01/25/2022   Procedure: ESOPHAGOGASTRODUODENOSCOPY (EGD) WITH PROPOFOL;  Surgeon: Daneil Dolin, MD;  Location:  [facility];  Service: Endoscopy;  Laterality: N/A;   HERNIA REPAIR     INCISION AND DRAINAGE OF WOUND  2006   [place]   INSERTION OF DIALYSIS CATHETER Right 07/25/2020   Procedure: INSERTION OF DIALYSIS CATHETER;  Surgeon: Virl Cagey, MD;  Location: [facility];  Service: General;  Laterality: Right;   INTRAOPERATIVE TRANSESOPHAGEAL ECHOCARDIOGRAM  12/27/2011   Global hypokinesis with EF of 40-45%, improved LAD distribution wall motion.   LEFT HEART CATHETERIZATION WITH CORONARY ANGIOGRAM N/A 12/13/2011   Procedure: LEFT HEART CATHETERIZATION WITH CORONARY ANGIOGRAM;  Surgeon: Leonie Man, MD;  Location: [facility] CATH LAB;  Service: Cardiovascular;  Laterality: N/A;   LUMBAR FUSION      MASS EXCISION  10/24/11   R arm   POLYPECTOMY  01/25/2022   Procedure: POLYPECTOMY;  Surgeon: Daneil Dolin, MD;  Location: [facility];  Service: Endoscopy;;   RIGHT HEART CATH N/A 07/12/2020   Procedure: RIGHT HEART CATH;  Surgeon: Belva Crome, MD;  Location: [facility] CV LAB;  Service: Cardiovascular;  Laterality: N/A;   TRANSESOPHAGEAL ECHOCARDIOGRAM  2013   Current Outpatient Medications on File Prior to Visit  Medication Sig Dispense Refill   aspirin EC 81 MG tablet Take 81 mg by mouth daily.     brimonidine (ALPHAGAN) 0.2 % ophthalmic solution Place 1 drop into both eyes 2 (two) times daily. 10 mL 3   cephALEXin (KEFLEX) 500 MG capsule Take 1 capsule (500 mg total) by mouth 3 (three) times daily. 21 capsule 0   Cholecalciferol (VITAMIN D3) 25 MCG (1000 UT) CAPS Take 1 capsule (1,000 Units total) by mouth daily at 6 (six) AM. 60 capsule 6   citalopram (CELEXA) 10 MG tablet Take 10 mg by mouth daily.     desvenlafaxine (PRISTIQ) 25 MG 24 hr tablet TAKE ONE TABLET BY MOUTH DAILY. 90 tablet 0   diltiazem (CARDIZEM CD) 120 MG 24 hr capsule TAKE ONE CAPSULE ('120MG'$  TOTAL) BY MOUTH DAILY 30 capsule 1   doxazosin (CARDURA) 8 MG tablet Take 8 mg by mouth daily.     ENTRESTO 97-103 MG TAKE ONE TABLET BY MOUTH TWICE A DAY 60 tablet 6   furosemide (LASIX) 20 MG tablet Take 20 mg by mouth daily.     hydrALAZINE (APRESOLINE) 50 MG tablet Take 50 mg by mouth 3 (three) times daily.     levothyroxine (SYNTHROID) 50 MCG tablet Take 1 tablet (50 mcg total) by mouth daily before breakfast. 90 tablet 3   multivitamin (RENA-VIT) TABS tablet Take 1 tablet by mouth daily.     pantoprazole (PROTONIX) 40 MG tablet TAKE ONE TABLET BY MOUTH TWICE A DAY 180 tablet 3   pravastatin (PRAVACHOL) 20 MG tablet TAKE ONE TABLET ('20MG'$  TOTAL) BY MOUTH DAILY 90 tablet 3   torsemide (DEMADEX) 20 MG tablet Take 20 mg by mouth daily as needed (swelling).     No current facility-administered medications on file prior to  visit.   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  Tobacco Use   Smoking status: Former    Types: Cigarettes    Quit date: 10/01/1986    Years since quitting: 35.7   Smokeless tobacco: Never  Tobacco comments:    quit about 30 yrs ago  Vaping Use   Vaping Use: Never used  Substance and Sexual Activity   Alcohol use: No   Drug use: No   Sexual activity: Not Currently  Other Topics Concern   Not on file  Social History Narrative   Married,  father of one,  grandfather 17. Works out at [employer] at [employer] roughly 2-3 days a week. He works on a treadmill. He does note having a hard time getting his heart rate up.   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   He quit smoking in 1988, and does not drink alcohol.   Social Determinants of Health   Financial Resource Strain: Low Risk  (11/30/2021)   Overall Financial Resource Strain (CARDIA)    Difficulty of Paying Living Expenses: Not hard at all  Food Insecurity: No Food Insecurity (11/30/2021)   Hunger Vital Sign    Worried About Running Out of Food in the Last Year: Never true    Ran Out of Food in the Last Year: Never true  Transportation Needs: No Transportation Needs (11/30/2021)   PRAPARE - Hydrologist (Medical): No    Lack of Transportation (Non-Medical): No  Physical Activity: Insufficiently Active (11/30/2021)   Exercise Vital Sign    Days of Exercise per Week: 5 days    Minutes of Exercise per Session: 20 min  Stress: No Stress Concern Present (11/30/2021)   [employer]    Feeling of Stress : Not at all  Social Connections: [place] (11/30/2021)   Social  Connection and Isolation Panel [NHANES]    Frequency of Communication with Friends and Family: More than three times a week    Frequency of Social Gatherings with Friends and Family: More than three times a week    Attends Religious Services: 1 to 4 times per year    Active Member of [employer] or Organizations: No    Attends Archivist Meetings: 1 to 4 times per year    Marital Status: Married  Human resources officer Violence: Not At Risk (11/30/2021)   Humiliation, Afraid, Rape, and Kick questionnaire    Fear of Current or Ex-Partner: No    Emotionally Abused: No    Physically Abused: No    Sexually Abused: No      Review of Systems  All other systems reviewed and are negative.      Objective:   Physical Exam Vitals reviewed.  Constitutional:      Appearance: Normal appearance.  Cardiovascular:     Rate and Rhythm: Normal rate and regular rhythm.     Heart sounds: Normal heart sounds. No murmur heard. Pulmonary:     Effort: Pulmonary effort is normal. No respiratory distress.     Breath sounds: Normal breath sounds. No stridor. No wheezing, rhonchi or rales.  Abdominal:     General: Bowel sounds are normal. There is no distension.     Palpations: Abdomen is soft.     Tenderness: There is no abdominal tenderness.  Genitourinary:    Prostate: Enlarged and tender.     Rectum: Normal.  Skin:    Findings: No erythema or rash.  Neurological:     Mental Status: He is alert.           Assessment & Plan:  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[place])  Acute prostatitis I believe the patient  has acute prostatitis.  He has tried and failed 1 week of Keflex.  I will switch to Cipro.  Based on peritoneal dialysis they recommend 250 to 500 mg once daily.  I will start the patient on 500 mg once daily.  Initially began with 10 days.  If his symptoms have resolved after 10 days he can discontinue antibiotics.  Can continue up to 14 days if not completely resolved.   Recommended the patient hold his Celexa due to the risk of QT prolongation.  Recheck immediately if worsening

## 2022-06-12 NOTE — Addendum Note (Signed)
Addended by: Randal Buba K on: 06/12/2022 02:34 PM   Modules accepted: Orders

## 2022-06-13 DIAGNOSIS — N186 End stage renal disease: Secondary | ICD-10-CM | POA: Diagnosis not present

## 2022-06-13 DIAGNOSIS — Z992 Dependence on renal dialysis: Secondary | ICD-10-CM | POA: Diagnosis not present

## 2022-06-14 DIAGNOSIS — N186 End stage renal disease: Secondary | ICD-10-CM | POA: Diagnosis not present

## 2022-06-14 DIAGNOSIS — Z992 Dependence on renal dialysis: Secondary | ICD-10-CM | POA: Diagnosis not present

## 2022-06-15 DIAGNOSIS — Z992 Dependence on renal dialysis: Secondary | ICD-10-CM | POA: Diagnosis not present

## 2022-06-15 DIAGNOSIS — N186 End stage renal disease: Secondary | ICD-10-CM | POA: Diagnosis not present

## 2022-06-16 DIAGNOSIS — N186 End stage renal disease: Secondary | ICD-10-CM | POA: Diagnosis not present

## 2022-06-16 DIAGNOSIS — Z992 Dependence on renal dialysis: Secondary | ICD-10-CM | POA: Diagnosis not present

## 2022-06-17 DIAGNOSIS — Z992 Dependence on renal dialysis: Secondary | ICD-10-CM | POA: Diagnosis not present

## 2022-06-17 DIAGNOSIS — N186 End stage renal disease: Secondary | ICD-10-CM | POA: Diagnosis not present

## 2022-06-18 ENCOUNTER — Telehealth: Payer: Self-pay

## 2022-06-18 ENCOUNTER — Other Ambulatory Visit: Payer: Self-pay | Admitting: Family Medicine

## 2022-06-18 DIAGNOSIS — Z992 Dependence on renal dialysis: Secondary | ICD-10-CM | POA: Diagnosis not present

## 2022-06-18 DIAGNOSIS — N186 End stage renal disease: Secondary | ICD-10-CM | POA: Diagnosis not present

## 2022-06-18 MED ORDER — SULFAMETHOXAZOLE-TRIMETHOPRIM 400-80 MG PO TABS: 1.0000 | ORAL_TABLET | Freq: Two times a day (BID) | ORAL | 0 refills | Status: DC

## 2022-06-18 NOTE — Telephone Encounter (Signed)
Pt called in stating that the meds that pcp just prescribed for pt for prostate infection are not working. Pt would like to speak to nurse about possibly trying something else that would help. Please advise.  Cb#: [phone]

## 2022-06-19 DIAGNOSIS — N186 End stage renal disease: Secondary | ICD-10-CM | POA: Diagnosis not present

## 2022-06-19 DIAGNOSIS — Z992 Dependence on renal dialysis: Secondary | ICD-10-CM | POA: Diagnosis not present

## 2022-06-20 DIAGNOSIS — Z992 Dependence on renal dialysis: Secondary | ICD-10-CM | POA: Diagnosis not present

## 2022-06-20 DIAGNOSIS — N186 End stage renal disease: Secondary | ICD-10-CM | POA: Diagnosis not present

## 2022-06-21 DIAGNOSIS — N186 End stage renal disease: Secondary | ICD-10-CM | POA: Diagnosis not present

## 2022-06-21 DIAGNOSIS — Z992 Dependence on renal dialysis: Secondary | ICD-10-CM | POA: Diagnosis not present

## 2022-06-22 DIAGNOSIS — Z992 Dependence on renal dialysis: Secondary | ICD-10-CM | POA: Diagnosis not present

## 2022-06-22 DIAGNOSIS — N186 End stage renal disease: Secondary | ICD-10-CM | POA: Diagnosis not present

## 2022-06-23 DIAGNOSIS — N186 End stage renal disease: Secondary | ICD-10-CM | POA: Diagnosis not present

## 2022-06-23 DIAGNOSIS — Z992 Dependence on renal dialysis: Secondary | ICD-10-CM | POA: Diagnosis not present

## 2022-06-24 DIAGNOSIS — N186 End stage renal disease: Secondary | ICD-10-CM | POA: Diagnosis not present

## 2022-06-24 DIAGNOSIS — Z992 Dependence on renal dialysis: Secondary | ICD-10-CM | POA: Diagnosis not present

## 2022-06-25 DIAGNOSIS — Z992 Dependence on renal dialysis: Secondary | ICD-10-CM | POA: Diagnosis not present

## 2022-06-25 DIAGNOSIS — N186 End stage renal disease: Secondary | ICD-10-CM | POA: Diagnosis not present

## 2022-06-26 DIAGNOSIS — Z992 Dependence on renal dialysis: Secondary | ICD-10-CM | POA: Diagnosis not present

## 2022-06-26 DIAGNOSIS — N186 End stage renal disease: Secondary | ICD-10-CM | POA: Diagnosis not present

## 2022-06-27 DIAGNOSIS — Z992 Dependence on renal dialysis: Secondary | ICD-10-CM | POA: Diagnosis not present

## 2022-06-27 DIAGNOSIS — N186 End stage renal disease: Secondary | ICD-10-CM | POA: Diagnosis not present

## 2022-06-28 DIAGNOSIS — N186 End stage renal disease: Secondary | ICD-10-CM | POA: Diagnosis not present

## 2022-06-28 DIAGNOSIS — Z992 Dependence on renal dialysis: Secondary | ICD-10-CM | POA: Diagnosis not present

## 2022-06-29 DIAGNOSIS — N186 End stage renal disease: Secondary | ICD-10-CM | POA: Diagnosis not present

## 2022-06-29 DIAGNOSIS — Z992 Dependence on renal dialysis: Secondary | ICD-10-CM | POA: Diagnosis not present

## 2022-06-30 DIAGNOSIS — N186 End stage renal disease: Secondary | ICD-10-CM | POA: Diagnosis not present

## 2022-06-30 DIAGNOSIS — Z992 Dependence on renal dialysis: Secondary | ICD-10-CM | POA: Diagnosis not present

## 2022-07-01 DIAGNOSIS — N186 End stage renal disease: Secondary | ICD-10-CM | POA: Diagnosis not present

## 2022-07-01 DIAGNOSIS — Z23 Encounter for immunization: Secondary | ICD-10-CM | POA: Diagnosis not present

## 2022-07-01 DIAGNOSIS — Z992 Dependence on renal dialysis: Secondary | ICD-10-CM | POA: Diagnosis not present

## 2022-07-02 DIAGNOSIS — N186 End stage renal disease: Secondary | ICD-10-CM | POA: Diagnosis not present

## 2022-07-02 DIAGNOSIS — Z23 Encounter for immunization: Secondary | ICD-10-CM | POA: Diagnosis not present

## 2022-07-02 DIAGNOSIS — Z992 Dependence on renal dialysis: Secondary | ICD-10-CM | POA: Diagnosis not present

## 2022-07-03 ENCOUNTER — Ambulatory Visit
Payer: Medicare Other | Attending: Student in an Organized Health Care Education/Training Program | Admitting: Student in an Organized Health Care Education/Training Program

## 2022-07-03 ENCOUNTER — Encounter: Payer: Self-pay | Admitting: Student in an Organized Health Care Education/Training Program

## 2022-07-03 DIAGNOSIS — N186 End stage renal disease: Secondary | ICD-10-CM | POA: Diagnosis not present

## 2022-07-03 DIAGNOSIS — Z23 Encounter for immunization: Secondary | ICD-10-CM | POA: Diagnosis not present

## 2022-07-03 DIAGNOSIS — G8929 Other chronic pain: Secondary | ICD-10-CM | POA: Diagnosis not present

## 2022-07-03 DIAGNOSIS — M4804 Spinal stenosis, thoracic region: Secondary | ICD-10-CM | POA: Diagnosis not present

## 2022-07-03 DIAGNOSIS — Z992 Dependence on renal dialysis: Secondary | ICD-10-CM | POA: Diagnosis not present

## 2022-07-03 DIAGNOSIS — M5124 Other intervertebral disc displacement, thoracic region: Secondary | ICD-10-CM | POA: Diagnosis not present

## 2022-07-03 DIAGNOSIS — M546 Pain in thoracic spine: Secondary | ICD-10-CM

## 2022-07-03 NOTE — Progress Notes (Signed)
Patient: Bradley Black  Service Category: E/M  Provider: Gillis Santa, MD  DOB: [birth year]  DOS: 07/03/2022  Location: Office  MRN: [id number]  Setting: Ambulatory outpatient  Referring Provider: Susy Frizzle, MD  Type: Established Patient  Specialty: Interventional Pain Management  PCP: Susy Frizzle, MD  Location: Remote location  Delivery: TeleHealth     Virtual Encounter - Pain Management PROVIDER NOTE: Information contained herein reflects review and annotations entered in association with encounter. Interpretation of such information and data should be left to medically-trained personnel. Information provided to patient can be located elsewhere in the medical record under "Patient Instructions". Document created using STT-dictation technology, any transcriptional errors that may result from process are unintentional.    Contact & Pharmacy Preferred: [place]: [phone] (home) Mobile: [phone] (mobile) E-mail: No e-mail address on record  [place], [address] [place] [address] Phone: [phone] Fax: [phone]  [place], [name] [address] Phone: [phone] Fax: [place], [name] [address] Phone: [phone] Fax: [phone]   Pre-screening  Bradley Black offered "in-person" vs "virtual" encounter. He indicated preferring virtual for this encounter.   Reason COVID-19*  Social distancing based on CDC and AMA recommendations.   I contacted Bradley Black on 07/03/2022 via telephone.      I clearly identified myself as Bradley Santa, MD. I verified that I was speaking with the correct person using two identifiers (Name: Bradley Black, and date of birth: 04-16-1946).  Consent I sought verbal advanced consent from Bradley Black for virtual visit interactions. I informed Bradley Black of  possible security and privacy concerns, risks, and limitations associated with providing "not-in-person" medical evaluation and management services. I also informed Bradley Black of the availability of "in-person" appointments. Finally, I informed him that there would be a charge for the virtual visit and that he could be  personally, fully or partially, financially responsible for it. Bradley Black expressed understanding and agreed to proceed.   Historic Elements   Bradley Black is a 76 y.o. year old, male patient evaluated today after our last contact on 05/28/2022. 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 He also  has a past surgical history that includes Back surgery (1970); Mass excision (10/24/11); Lumbar fusion; Coronary angioplasty (1994); Coronary angioplasty (1997); Coronary angioplasty with stent (1999); Coronary angioplasty with stent (2001); Incision and drainage of wound (2006); Hernia repair; Coronary artery bypass graft (12/27/2011); Intraoprative transesophageal echocardiogram (12/27/2011); left heart catheterization with coronary angiogram (N/A, 12/13/2011); transesophageal echocardiogram (2013); Cardiac catheterization (2013); RIGHT HEART CATH (N/A, 07/12/2020); Insertion of dialysis catheter (Right, 07/25/2020); AV fistula placement (Left, 07/28/2020); Colonoscopy with propofol (N/A, 01/25/2022); Esophagogastroduodenoscopy (egd) with propofol (N/A, 01/25/2022); biopsy (01/25/2022); and polypectomy (01/25/2022). Bradley Black has a  current medication list which includes the following prescription(s): aspirin ec, brimonidine, cephalexin, vitamin d3, citalopram, desvenlafaxine, diltiazem, doxazosin, entresto, furosemide, hydralazine, levothyroxine, multivitamin, pantoprazole, pravastatin, sildenafil, sulfamethoxazole-trimethoprim, and torsemide. He  reports that he quit smoking  about 35 years ago. His smoking use included cigarettes. He has never used smokeless tobacco. He reports that he does not drink alcohol and does not use drugs. Bradley Black has No Known Allergies.   HPI  Today, he is being contacted for a post-procedure assessment.   Post-procedure evaluation   Type: Thoracic facet medial branch block #1  Laterality: Bilateral (-50)  Level: T7, T8, and T9 Medial Branch Level(s)  Imaging: Fluoroscopy-guided         Anesthesia: Local anesthesia (1-2% Lidocaine) Anxiolysis: None                 DOS: 05/09/2022  Performed by: Bradley Santa, MD  Purpose: Diagnostic/Therapeutic Indications: Thoracic back pain severe enough to impact quality of life or function. Rationale (medical necessity): procedure needed and proper for the diagnosis and/or treatment of Mr. Statz medical symptoms and needs. 1. Thoracic facet joint syndrome (moderate T7-T8)   2. Thoracic disc herniation   3. Chronic thoracic spine pain    NAS-11 Pain score:   Pre-procedure: 4 /10   Post-procedure: 0-No pain/10      Effectiveness:  Initial hour after procedure: 100 %  Subsequent 4-6 hours post-procedure: 100 % (2 days)  Analgesia past initial 6 hours: 0 %  Ongoing improvement:  Analgesic:  0% Function: No benefit ROM: No benefit   Laboratory Chemistry Profile   Renal Lab Results  Component Value Date   BUN 53 (H) 05/28/2022   CREATININE 3.51 (H) 05/28/2022   LABCREA 34.25 07/09/2020   BCR 18 02/15/2022   GFRAA 24 (L) 05/01/2019   GFRNONAA 17 (L) 05/28/2022    Hepatic Lab Results  Component Value Date   AST 28 05/28/2022   ALT  31 05/28/2022   ALBUMIN 3.3 (L) 05/28/2022   ALKPHOS 93 05/28/2022   HCVAB NON REACTIVE 07/21/2020    Electrolytes Lab Results  Component Value Date   NA 135 05/28/2022   K 4.6 05/28/2022   CL 107 05/28/2022   CALCIUM 8.8 (L) 05/28/2022   MG 2.2 04/20/2021   PHOS 4.6 08/02/2020    Bone Lab Results  Component Value Date   VD25OH 23.20 (L) 04/20/2021    Inflammation (CRP: Acute Phase) (ESR: Chronic Phase) Lab Results  Component Value Date   LATICACIDVEN 1.1 05/28/2022         Note: Above Lab results reviewed.  Imaging  Narrative CLINICAL DATA:  Chronic upper/mid back pain.   EXAM: MRI THORACIC SPINE WITHOUT CONTRAST   TECHNIQUE: Multiplanar, multisequence MR imaging of the thoracic spine was performed. No intravenous contrast was administered.   COMPARISON:  MR thoracic 07/05/2014; X-ray thoracic 12/07/2021.   FINDINGS: Alignment: Chronically exaggerated upper thoracic kyphosis. Unchanged trace anterolisthesis of T2 on T3.   Vertebrae: No fracture, suspicious marrow lesion, or significant marrow edema.   Cord:  Normal signal.   Paraspinal and other soft tissues: Left renal cysts measuring up to 2.4 cm.   Disc levels:   Unchanged small central disc protrusions at T6-7, T7-8, and T11-12 without associated stenosis or significant spinal cord mass effect. Unchanged moderate-sized central disc extrusion at T8-9 which mildly indents the ventral spinal cord without significant stenosis. Widespread moderate thoracic facet arthrosis, greatest on the left at T7-8 where there is moderate left neural foraminal stenosis.   IMPRESSION: 1. Unchanged thoracic disc herniations, the largest being at T8-9 where there is mild spinal cord indentation. No significant spinal stenosis. 2. Moderate facet arthrosis with left neural foraminal stenosis at T7-8.  Electronically Signed By: Logan Bores M.D. On: 12/25/2021 13:56   DG Chest Port 1 View CLINICAL DATA:   Shortness of breath and generalized weakness with fever. Body aches. Positive COVID test.  EXAM: PORTABLE CHEST 1 VIEW  COMPARISON:  Chest radiograph 12/03/2020  FINDINGS: Median sternotomy wires are again seen. The cardiomediastinal silhouette is stable.  There is no focal consolidation or pulmonary edema. There is no pleural effusion or pneumothorax  There is no acute osseous abnormality.  IMPRESSION: No radiographic evidence of acute cardiopulmonary process.  Electronically Signed   By: Valetta Mole M.D.   On: 05/28/2022 12:07  Assessment  The primary encounter diagnosis was Thoracic disc herniation. Diagnoses of Foraminal stenosis of thoracic region (T7/T8) and Chronic thoracic spine pain were also pertinent to this visit.  Plan of Care  Continues to have persistent midthoracic pain.  Reviewed thoracic MRI which shows T7-T8 foraminal stenosis as well as disc protrusion at T8-T9 which is moderate-sized.  Discussed T7-T8 versus T8-T9 thoracic ESI based upon interlaminar windows at the time of injection.  Risk and benefits reviewed and patient would like to proceed.   Orders:  Orders Placed This Encounter  Procedures   Thoracic Epidural Injection    Standing Status:   Future    Standing Expiration Date:   10/03/2022    Scheduling Instructions:     T7/T8 ESI     No sedation    Order Specific Question:   Where will this procedure be performed?    Answer:   [facility]   Follow-up plan:   Return in about 2 weeks (around 07/17/2022) for T8-T9 ESI, in clinic NS.     B/L T7,8,9 MBNB #1 05/09/22    Recent Visits Date Type Provider Dept  05/09/22 Procedure visit Bradley Santa, MD [facility]  05/02/22 Office Visit Bradley Santa, MD [facility]  Showing recent visits within past 90 days and meeting all other requirements Today's Visits Date Type Provider Dept  07/03/22 Office Visit Bradley Santa, MD [facility]  Showing today's visits  and meeting all other requirements Future Appointments No visits were found meeting these conditions. Showing future appointments within next 90 days and meeting all other requirements  I discussed the assessment and treatment plan with the patient. The patient was provided an opportunity to ask questions and all were answered. The patient agreed with the plan and demonstrated an understanding of the instructions.  Patient advised to call back or seek an in-person evaluation if the symptoms or condition worsens.  Duration of encounter: 14mnutes.  Note by: BGillis Santa MD Date: 07/03/2022; Time: 2:03 PM

## 2022-07-04 DIAGNOSIS — Z23 Encounter for immunization: Secondary | ICD-10-CM | POA: Diagnosis not present

## 2022-07-04 DIAGNOSIS — Z992 Dependence on renal dialysis: Secondary | ICD-10-CM | POA: Diagnosis not present

## 2022-07-04 DIAGNOSIS — N186 End stage renal disease: Secondary | ICD-10-CM | POA: Diagnosis not present

## 2022-07-05 DIAGNOSIS — N186 End stage renal disease: Secondary | ICD-10-CM | POA: Diagnosis not present

## 2022-07-05 DIAGNOSIS — Z23 Encounter for immunization: Secondary | ICD-10-CM | POA: Diagnosis not present

## 2022-07-05 DIAGNOSIS — Z992 Dependence on renal dialysis: Secondary | ICD-10-CM | POA: Diagnosis not present

## 2022-07-06 DIAGNOSIS — N186 End stage renal disease: Secondary | ICD-10-CM | POA: Diagnosis not present

## 2022-07-06 DIAGNOSIS — Z992 Dependence on renal dialysis: Secondary | ICD-10-CM | POA: Diagnosis not present

## 2022-07-06 DIAGNOSIS — Z23 Encounter for immunization: Secondary | ICD-10-CM | POA: Diagnosis not present

## 2022-07-07 DIAGNOSIS — Z992 Dependence on renal dialysis: Secondary | ICD-10-CM | POA: Diagnosis not present

## 2022-07-07 DIAGNOSIS — Z23 Encounter for immunization: Secondary | ICD-10-CM | POA: Diagnosis not present

## 2022-07-07 DIAGNOSIS — N186 End stage renal disease: Secondary | ICD-10-CM | POA: Diagnosis not present

## 2022-07-08 DIAGNOSIS — Z23 Encounter for immunization: Secondary | ICD-10-CM | POA: Diagnosis not present

## 2022-07-08 DIAGNOSIS — Z992 Dependence on renal dialysis: Secondary | ICD-10-CM | POA: Diagnosis not present

## 2022-07-08 DIAGNOSIS — N186 End stage renal disease: Secondary | ICD-10-CM | POA: Diagnosis not present

## 2022-07-09 DIAGNOSIS — N186 End stage renal disease: Secondary | ICD-10-CM | POA: Diagnosis not present

## 2022-07-09 DIAGNOSIS — Z992 Dependence on renal dialysis: Secondary | ICD-10-CM | POA: Diagnosis not present

## 2022-07-09 DIAGNOSIS — Z23 Encounter for immunization: Secondary | ICD-10-CM | POA: Diagnosis not present

## 2022-07-10 DIAGNOSIS — N186 End stage renal disease: Secondary | ICD-10-CM | POA: Diagnosis not present

## 2022-07-10 DIAGNOSIS — Z992 Dependence on renal dialysis: Secondary | ICD-10-CM | POA: Diagnosis not present

## 2022-07-10 DIAGNOSIS — Z23 Encounter for immunization: Secondary | ICD-10-CM | POA: Diagnosis not present

## 2022-07-11 DIAGNOSIS — Z23 Encounter for immunization: Secondary | ICD-10-CM | POA: Diagnosis not present

## 2022-07-11 DIAGNOSIS — Z992 Dependence on renal dialysis: Secondary | ICD-10-CM | POA: Diagnosis not present

## 2022-07-11 DIAGNOSIS — N186 End stage renal disease: Secondary | ICD-10-CM | POA: Diagnosis not present

## 2022-07-12 DIAGNOSIS — Z992 Dependence on renal dialysis: Secondary | ICD-10-CM | POA: Diagnosis not present

## 2022-07-12 DIAGNOSIS — Z23 Encounter for immunization: Secondary | ICD-10-CM | POA: Diagnosis not present

## 2022-07-12 DIAGNOSIS — N186 End stage renal disease: Secondary | ICD-10-CM | POA: Diagnosis not present

## 2022-07-13 DIAGNOSIS — N186 End stage renal disease: Secondary | ICD-10-CM | POA: Diagnosis not present

## 2022-07-13 DIAGNOSIS — Z23 Encounter for immunization: Secondary | ICD-10-CM | POA: Diagnosis not present

## 2022-07-13 DIAGNOSIS — Z992 Dependence on renal dialysis: Secondary | ICD-10-CM | POA: Diagnosis not present

## 2022-07-14 DIAGNOSIS — Z992 Dependence on renal dialysis: Secondary | ICD-10-CM | POA: Diagnosis not present

## 2022-07-14 DIAGNOSIS — Z23 Encounter for immunization: Secondary | ICD-10-CM | POA: Diagnosis not present

## 2022-07-14 DIAGNOSIS — N186 End stage renal disease: Secondary | ICD-10-CM | POA: Diagnosis not present

## 2022-07-15 DIAGNOSIS — Z992 Dependence on renal dialysis: Secondary | ICD-10-CM | POA: Diagnosis not present

## 2022-07-15 DIAGNOSIS — N186 End stage renal disease: Secondary | ICD-10-CM | POA: Diagnosis not present

## 2022-07-15 DIAGNOSIS — Z23 Encounter for immunization: Secondary | ICD-10-CM | POA: Diagnosis not present

## 2022-07-16 DIAGNOSIS — Z23 Encounter for immunization: Secondary | ICD-10-CM | POA: Diagnosis not present

## 2022-07-16 DIAGNOSIS — Z992 Dependence on renal dialysis: Secondary | ICD-10-CM | POA: Diagnosis not present

## 2022-07-16 DIAGNOSIS — N186 End stage renal disease: Secondary | ICD-10-CM | POA: Diagnosis not present

## 2022-07-17 DIAGNOSIS — N186 End stage renal disease: Secondary | ICD-10-CM | POA: Diagnosis not present

## 2022-07-17 DIAGNOSIS — Z992 Dependence on renal dialysis: Secondary | ICD-10-CM | POA: Diagnosis not present

## 2022-07-17 DIAGNOSIS — Z23 Encounter for immunization: Secondary | ICD-10-CM | POA: Diagnosis not present

## 2022-07-18 DIAGNOSIS — Z992 Dependence on renal dialysis: Secondary | ICD-10-CM | POA: Diagnosis not present

## 2022-07-18 DIAGNOSIS — N186 End stage renal disease: Secondary | ICD-10-CM | POA: Diagnosis not present

## 2022-07-18 DIAGNOSIS — Z23 Encounter for immunization: Secondary | ICD-10-CM | POA: Diagnosis not present

## 2022-07-19 DIAGNOSIS — Z23 Encounter for immunization: Secondary | ICD-10-CM | POA: Diagnosis not present

## 2022-07-19 DIAGNOSIS — N186 End stage renal disease: Secondary | ICD-10-CM | POA: Diagnosis not present

## 2022-07-19 DIAGNOSIS — Z992 Dependence on renal dialysis: Secondary | ICD-10-CM | POA: Diagnosis not present

## 2022-07-20 DIAGNOSIS — N186 End stage renal disease: Secondary | ICD-10-CM | POA: Diagnosis not present

## 2022-07-20 DIAGNOSIS — Z992 Dependence on renal dialysis: Secondary | ICD-10-CM | POA: Diagnosis not present

## 2022-07-20 DIAGNOSIS — Z23 Encounter for immunization: Secondary | ICD-10-CM | POA: Diagnosis not present

## 2022-07-21 DIAGNOSIS — N186 End stage renal disease: Secondary | ICD-10-CM | POA: Diagnosis not present

## 2022-07-21 DIAGNOSIS — Z992 Dependence on renal dialysis: Secondary | ICD-10-CM | POA: Diagnosis not present

## 2022-07-21 DIAGNOSIS — Z23 Encounter for immunization: Secondary | ICD-10-CM | POA: Diagnosis not present

## 2022-07-22 DIAGNOSIS — N186 End stage renal disease: Secondary | ICD-10-CM | POA: Diagnosis not present

## 2022-07-22 DIAGNOSIS — Z992 Dependence on renal dialysis: Secondary | ICD-10-CM | POA: Diagnosis not present

## 2022-07-22 DIAGNOSIS — Z23 Encounter for immunization: Secondary | ICD-10-CM | POA: Diagnosis not present

## 2022-07-23 ENCOUNTER — Ambulatory Visit
Payer: Medicare Other | Attending: Student in an Organized Health Care Education/Training Program | Admitting: Student in an Organized Health Care Education/Training Program

## 2022-07-23 ENCOUNTER — Encounter: Payer: Self-pay | Admitting: Student in an Organized Health Care Education/Training Program

## 2022-07-23 ENCOUNTER — Ambulatory Visit
Admission: RE | Admit: 2022-07-23 | Discharge: 2022-07-23 | Disposition: A | Discharge status: Home / Self Care | Payer: Medicare Other | Source: Ambulatory Visit | Attending: Student in an Organized Health Care Education/Training Program | Admitting: Student in an Organized Health Care Education/Training Program

## 2022-07-23 VITALS — BP 198/97 | HR 60 | Temp 98.2°F | Resp 18 | Ht 68.0 in | Wt 140.0 lb

## 2022-07-23 DIAGNOSIS — M5124 Other intervertebral disc displacement, thoracic region: Secondary | ICD-10-CM | POA: Insufficient documentation

## 2022-07-23 DIAGNOSIS — M4804 Spinal stenosis, thoracic region: Secondary | ICD-10-CM | POA: Diagnosis not present

## 2022-07-23 DIAGNOSIS — M5114 Intervertebral disc disorders with radiculopathy, thoracic region: Secondary | ICD-10-CM | POA: Diagnosis not present

## 2022-07-23 DIAGNOSIS — N186 End stage renal disease: Secondary | ICD-10-CM | POA: Diagnosis not present

## 2022-07-23 DIAGNOSIS — Z992 Dependence on renal dialysis: Secondary | ICD-10-CM | POA: Diagnosis not present

## 2022-07-23 DIAGNOSIS — G8929 Other chronic pain: Secondary | ICD-10-CM | POA: Insufficient documentation

## 2022-07-23 DIAGNOSIS — Z23 Encounter for immunization: Secondary | ICD-10-CM | POA: Diagnosis not present

## 2022-07-23 DIAGNOSIS — M546 Pain in thoracic spine: Secondary | ICD-10-CM | POA: Insufficient documentation

## 2022-07-23 MED ORDER — DEXAMETHASONE SODIUM PHOSPHATE 10 MG/ML IJ SOLN
10.0000 mg | Freq: Once | INTRAMUSCULAR | Status: AC
  Administered 2022-07-23: 10 mg
  Filled 2022-07-23: qty 1

## 2022-07-23 MED ORDER — LIDOCAINE HCL 2 % IJ SOLN
20.0000 mL | Freq: Once | INTRAMUSCULAR | Status: AC
Start: 2022-07-23 — End: 2022-07-23
  Administered 2022-07-23: 100 mg
  Filled 2022-07-23: qty 40

## 2022-07-23 MED ORDER — IOHEXOL 180 MG/ML  SOLN
10.0000 mL | Freq: Once | INTRAMUSCULAR | Status: AC
  Administered 2022-07-23: 10 mL via EPIDURAL
  Filled 2022-07-23: qty 20

## 2022-07-23 MED ORDER — ROPIVACAINE HCL 2 MG/ML IJ SOLN
2.0000 mL | Freq: Once | INTRAMUSCULAR | Status: AC
  Administered 2022-07-23: 2 mL via EPIDURAL
  Filled 2022-07-23: qty 20

## 2022-07-23 MED ORDER — SODIUM CHLORIDE 0.9% FLUSH
2.0000 mL | Freq: Once | INTRAVENOUS | Status: AC
  Administered 2022-07-23: 2 mL

## 2022-07-23 NOTE — Progress Notes (Signed)
Safety precautions to be maintained throughout the outpatient stay will include: orient to surroundings, keep bed in low position, maintain call bell within reach at all times, provide assistance with transfer out of bed and ambulation.  

## 2022-07-23 NOTE — Progress Notes (Signed)
PROVIDER NOTE: Interpretation of information contained herein should be left to medically-trained personnel. Specific patient instructions are provided elsewhere under "Patient Instructions" section of medical record. This document was created in part using STT-dictation technology, any transcriptional errors that may result from this process are unintentional.  Patient: Bradley Black Type: Established DOB: 04-25-1946 MRN: [id number] PCP: Susy Frizzle, MD  Service: Procedure DOS: 07/23/2022 Setting: Ambulatory Location: Ambulatory outpatient facility Delivery: Face-to-face Provider: Gillis Santa, MD Specialty: Interventional Pain Management Specialty designation: 09 Location: Outpatient facility Ref. Prov.: Susy Frizzle, MD    Primary Reason for Visit: Interventional Pain Management Treatment. CC: Back Pain (upper)  Procedure:           Inter-Laminar Thoracic Epidural Steroid Block/Injection  #1  Laterality:  Midline Level: T11-12  Imaging: Fluoroscopic guidance Anesthesia: Local anesthesia (1-2% Lidocaine) DOS: 07/23/2022 Performed by: Gillis Santa, MD  Purpose: Diagnostic/Therapeutic Indications: Thoracic back pain, radicular pain, with degenerative disc disease severe enough to impact quality of life or function. 1. 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 Chronic thoracic spine pain    NAS-11 Pain score:   Pre-procedure: 3 /10   Post-procedure: 1 /10     Position / Prep / Materials:  Position: Prone  Prep solution: DuraPrep (Iodine Povacrylex [0.7% available iodine] and Isopropyl Alcohol, 74% w/w) Prep Area: Posterior Thoracolumbar (Upper back from shoulders to lower lumbar region).  Materials:  Tray: Epidural Needle(s) Type: Epidural needle Gauge (G): 22 Length: Regular (3.5-in) Qty: 1 Pre-op H&P Assessment:  Bradley Black is a 76 y.o. (year old), male patient, seen today for interventional treatment. 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 Bradley Black has a current medication list which includes the following prescription(s): aspirin ec, brimonidine, cephalexin, vitamin d3, citalopram, desvenlafaxine, diltiazem, doxazosin, entresto, furosemide, hydralazine, levothyroxine, multivitamin, pantoprazole, pravastatin, sildenafil, sulfamethoxazole-trimethoprim, and torsemide. His primarily concern today is the Back Pain (upper)  Initial Vital Signs:  Pulse/HCG Rate: 60ECG Heart Rate: 64 Temp: 98.2 F (36.8 C) Resp: 18 BP: (!) 192/73 SpO2: 100 %  BMI: Estimated body mass index is 21.29 kg/m as calculated from the following:   Height as of this encounter: '5\' 8"'$  (1.727 m).   Weight as of this encounter: 140 lb (63.5 kg).  Risk Assessment: Allergies: Reviewed. He has No Known Allergies.  Allergy Precautions: None required Coagulopathies: Reviewed. None identified.  Blood-thinner therapy: None at this time Active Infection(s): Reviewed. None identified. [name] is afebrile  Site Confirmation: Bradley Black was asked to confirm the procedure and laterality before marking the site Procedure checklist: Completed Consent: Before the procedure and  under the influence of no sedative(s), amnesic(s), or anxiolytics, the patient was informed of the treatment options, risks and possible complications. To fulfill our ethical and legal obligations, as recommended by the American Medical Association's Code of Ethics, I have informed the patient of my clinical impression; the nature and purpose of the treatment or procedure; the  risks, benefits, and possible complications of the intervention; the alternatives, including doing nothing; the risk(s) and benefit(s) of the alternative treatment(s) or procedure(s); and the risk(s) and benefit(s) of doing nothing. The patient was provided information about the general risks and possible complications associated with the procedure. These may include, but are not limited to: failure to achieve desired goals, infection, bleeding, organ or nerve damage, allergic reactions, paralysis, and death. In addition, the patient was informed of those risks and complications associated to Spine-related procedures, such as failure to decrease pain; infection (i.e.: Meningitis, epidural or intraspinal abscess); bleeding (i.e.: epidural hematoma, subarachnoid hemorrhage, or any other type of intraspinal or peri-dural bleeding); organ or nerve damage (i.e.: Any type of peripheral nerve, nerve root, or spinal cord injury) with subsequent damage to sensory, motor, and/or autonomic systems, resulting in permanent pain, numbness, and/or weakness of one or several areas of the body; allergic reactions; (i.e.: anaphylactic reaction); and/or death. Furthermore, the patient was informed of those risks and complications associated with the medications. These include, but are not limited to: allergic reactions (i.e.: anaphylactic or anaphylactoid reaction(s)); adrenal axis suppression; blood sugar elevation that in diabetics may result in ketoacidosis or comma; water retention that in patients with history of congestive heart failure may result in  shortness of breath, pulmonary edema, and decompensation with resultant heart failure; weight gain; swelling or edema; medication-induced neural toxicity; particulate matter embolism and blood vessel occlusion with resultant organ, and/or nervous system infarction; and/or aseptic necrosis of one or more joints. Finally, the patient was informed that Medicine is not an exact science; therefore, there is also the possibility of unforeseen or unpredictable risks and/or possible complications that may result in a catastrophic outcome. The patient indicated having understood very clearly. We have given the patient no guarantees and we have made no promises. Enough time was given to the patient to ask questions, all of which were answered to the patient's satisfaction. Mr. Buzby has indicated that he wanted to continue with the procedure. Attestation: I, the ordering provider, attest that I have discussed with the patient the benefits, risks, side-effects, alternatives, likelihood of achieving goals, and potential problems during recovery for the procedure that I have provided informed consent. Date  Time: 07/23/2022 11:02 AM  Pre-Procedure Preparation:  Monitoring: As per clinic protocol. Respiration, ETCO2, SpO2, BP, heart rate and rhythm monitor placed and checked for adequate function Safety Precautions: Patient was assessed for positional comfort and pressure points before starting the procedure. Time-out: I initiated and conducted the "Time-out" before starting the procedure, as per protocol. The patient was asked to participate by confirming the accuracy of the "Time Out" information. Verification of the correct person, site, and procedure were performed and confirmed by me, the nursing staff, and the patient. "Time-out" conducted as per Joint Commission's Universal Protocol (UP.01.01.01). Time: 1132  Description of Procedure:          Target Area: For Epidural Steroid injection(s), the target area is  the  interlaminar space, initially targeting the lower border of the superior vertebral body lamina. Approach: Interlaminar approach. Area Prepped: Entire Posterior Thoracolumbar Region DuraPrep (Iodine Povacrylex [0.7% available iodine] and Isopropyl Alcohol, 74% w/w) Safety Precautions: Aspiration looking for blood return was conducted prior to all injections. At no point did we inject any substances, as a needle was being advanced. No attempts were made at seeking any paresthesias. Safe injection practices and needle disposal techniques used. Medications properly checked for expiration dates. SDV (single dose vial) medications used. Description  of the Procedure: Protocol guidelines were followed. The patient was placed in position over the fluoroscopy table. The target area was identified and the area prepped in the usual manner. Skin & deeper tissues infiltrated with local anesthetic. Appropriate amount of time allowed to pass for local anesthetics to take effect. The procedure needles were then advanced to the target area. The inferior aspect of the superior lamina was contacted and the needle walked caudad, until the lamina was cleared. The epidural space was identified using "loss-of-resistance technique" with 0.9% PF-NSS (2-47m), in a low friction 10cc LOR glass syringe. Proper needle placement was secured. Negative aspiration confirmed. Solution injected in intermittent fashion, asking for systemic symptoms every 0.5 cc of injectate. The needles were then removed and the area cleansed, making sure to leave some of the prepping solution behind to take advantage of its long term bactericidal properties.  5 cc solution made of 2 cc of preservative-free saline, 2 cc of 0.2% ropivacaine, 1 cc of Decadron 10 mg/cc.   Vitals:   07/23/22 1107 07/23/22 1131 07/23/22 1135  BP: (!) 192/73 (!) 196/89 (!) 198/97  Pulse: 60    Resp:  18 18  Temp: 98.2 F (36.8 C)    TempSrc: Temporal    SpO2: 100% 99%  99%  Weight: 140 lb (63.5 kg)    Height: '5\' 8"'$  (1.727 m)      Start Time: 1132 hrs. End Time: 1135 hrs. Imaging Guidance (Spinal):          Type of Imaging Technique: Fluoroscopy Guidance (Spinal) Indication(s): Assistance in needle guidance and placement for procedures requiring needle placement in or near specific anatomical locations not easily accessible without such assistance. Exposure Time: Please see nurses notes. Contrast: Before injecting any contrast, we confirmed that the patient did not have an allergy to iodine, shellfish, or radiological contrast. Once satisfactory needle placement was completed at the desired level, radiological contrast was injected. Contrast injected under live fluoroscopy. No contrast complications. See chart for type and volume of contrast used. Fluoroscopic Guidance: I was personally present during the use of fluoroscopy. "Tunnel Vision Technique" used to obtain the best possible view of the target area. Parallax error corrected before commencing the procedure. "Direction-depth-direction" technique used to introduce the needle under continuous pulsed fluoroscopy. Once target was reached, antero-posterior, oblique, and lateral fluoroscopic projection used confirm needle placement in all planes. Images permanently stored in EMR. Interpretation: I personally interpreted the imaging intraoperatively. Adequate needle placement confirmed in multiple planes. Appropriate spread of contrast into desired area was observed. No evidence of afferent or efferent intravascular uptake. No intrathecal or subarachnoid spread observed. Permanent images saved into the patient's record.  Antibiotic Prophylaxis:   Anti-infectives (From admission, onward)    None      Indication(s): None identified  Post-operative Assessment:  Post-procedure Vital Signs:  Pulse/HCG Rate: 6063 Temp: 98.2 F (36.8 C) Resp: 18 BP: (!) 198/97 SpO2: 99 %  EBL: None  Complications: No  immediate post-treatment complications observed by team, or reported by patient.  Note: The patient tolerated the entire procedure well. A repeat set of vitals were taken after the procedure and the patient was kept under observation following institutional policy, for this type of procedure. Post-procedural neurological assessment was performed, showing return to baseline, prior to discharge. The patient was provided with post-procedure discharge instructions, including a section on how to identify potential problems. Should any problems arise concerning this procedure, the patient was given instructions to immediately contact [place] at any  time, without hesitation. In any case, we plan to contact the patient by telephone for a follow-up status report regarding this interventional procedure.  Comments:  No additional relevant information.  Plan of Care  Orders:  Orders Placed This Encounter  Procedures   DG PAIN CLINIC C-ARM 1-60 MIN NO REPORT    Intraoperative interpretation by procedural physician at [facility].    Standing Status:   Standing    Number of Occurrences:   1    Order Specific Question:   Reason for exam:    Answer:   Assistance in needle guidance and placement for procedures requiring needle placement in or near specific anatomical locations not easily accessible without such assistance.   Medications ordered for procedure: Meds ordered this encounter  Medications   iohexol (OMNIPAQUE) 180 MG/ML injection 10 mL    Must be Myelogram-compatible. If not available, you may substitute with a water-soluble, non-ionic, hypoallergenic, myelogram-compatible radiological contrast medium.   lidocaine (XYLOCAINE) 2 % (with pres) injection 400 mg   sodium chloride flush (NS) 0.9 % injection 2 mL   ropivacaine (PF) 2 mg/mL (0.2%) (NAROPIN) injection 2 mL   dexamethasone (DECADRON) injection 10 mg   Medications administered: We administered iohexol, lidocaine, sodium chloride  flush, ropivacaine (PF) 2 mg/mL (0.2%), and dexamethasone.  See the medical record for exact dosing, route, and time of administration.  Follow-up plan:   Return in about 4 weeks (around 08/20/2022) for PPE virtual.       B/L T7,8,9 MBNB #1 05/09/22, T11/12 ESI 07/23/22    Recent Visits Date Type Provider Dept  07/03/22 Office Visit Gillis Santa, MD [facility]  05/09/22 Procedure visit Gillis Santa, MD [facility]  05/02/22 Office Visit Gillis Santa, MD [facility]  Showing recent visits within past 90 days and meeting all other requirements Today's Visits Date Type Provider Dept  07/23/22 Procedure visit Gillis Santa, MD [facility]  Showing today's visits and meeting all other requirements Future Appointments Date Type Provider Dept  08/22/22 Appointment Gillis Santa, MD [facility]  Showing future appointments within next 90 days and meeting all other requirements  Disposition: Discharge home  Discharge (Date  Time): 07/23/2022; 1145 hrs.   Primary Care Physician: Susy Frizzle, MD Location: [facility] Note by: Gillis Santa, MD Date: 07/23/2022; Time: 11:41 AM  Disclaimer:  Medicine is not an exact science. The only guarantee in medicine is that nothing is guaranteed. It is important to note that the decision to proceed with this intervention was based on the information collected from the patient. The Data and conclusions were drawn from the patient's questionnaire, the interview, and the physical examination. Because the information was provided in large part by the patient, it cannot be guaranteed that it has not been purposely or unconsciously manipulated. Every effort has been made to obtain as much relevant data as possible for this evaluation. It is important to note that the conclusions that lead to this procedure are derived in large part from the available data. Always take into  account that the treatment will also be dependent on availability of resources and existing treatment guidelines, considered by other Pain Management Practitioners as being common knowledge and practice, at the time of the intervention. For Medico-Legal purposes, it is also important to point out that variation in procedural techniques and pharmacological choices are the acceptable norm. The indications, contraindications, technique, and results of the above procedure should only be interpreted  and judged by a Board-Certified Interventional Pain Specialist with extensive familiarity and expertise in the same exact procedure and technique.

## 2022-07-23 NOTE — Patient Instructions (Signed)
Pain Management Discharge Instructions  General Discharge Instructions :  If you need to reach your doctor call: Monday-Friday 8:00 am - 4:00 pm at [phone] or toll free [phone].  After clinic hours [phone] to have operator reach doctor.  Bring all of your medication bottles to all your appointments in the pain clinic.  To cancel or reschedule your appointment with Pain Management please remember to call 24 hours in advance to avoid a fee.  Refer to the educational materials which you have been given on: General Risks, I had my Procedure. Discharge Instructions, Post Sedation.  Post Procedure Instructions:  The drugs you were given will stay in your system until tomorrow, so for the next 24 hours you should not drive, make any legal decisions or drink any alcoholic beverages.  You may eat anything you prefer, but it is better to start with liquids then soups and crackers, and gradually work up to solid foods.  Please notify your doctor immediately if you have any unusual bleeding, trouble breathing or pain that is not related to your normal pain.  Depending on the type of procedure that was done, some parts of your body may feel week and/or numb.  This usually clears up by tonight or the next day.  Walk with the use of an assistive device or accompanied by an adult for the 24 hours.  You may use ice on the affected area for the first 24 hours.  Put ice in a Ziploc bag and cover with a towel and place against area 15 minutes on 15 minutes off.  You may switch to heat after 24 hours.Epidural Steroid Injection Patient Information  Description: The epidural space surrounds the nerves as they exit the spinal cord.  In some patients, the nerves can be compressed and inflamed by a bulging disc or a tight spinal canal (spinal stenosis).  By injecting steroids into the epidural space, we can bring irritated nerves into direct contact with a potentially helpful medication.  These  steroids act directly on the irritated nerves and can reduce swelling and inflammation which often leads to decreased pain.  Epidural steroids may be injected anywhere along the spine and from the neck to the low back depending upon the location of your pain.   After numbing the skin with local anesthetic (like Novocaine), a small needle is passed into the epidural space slowly.  You may experience a sensation of pressure while this is being done.  The entire block usually last less than 10 minutes.  Conditions which may be treated by epidural steroids:  Low back and leg pain Neck and arm pain Spinal stenosis Post-laminectomy syndrome Herpes zoster (shingles) pain Pain from compression fractures  Preparation for the injection:  Do not eat any solid food or dairy products within 8 hours of your appointment.  You may drink clear liquids up to 3 hours before appointment.  Clear liquids include water, black coffee, juice or soda.  No milk or cream please. You may take your regular medication, including pain medications, with a sip of water before your appointment  Diabetics should hold regular insulin (if taken separately) and take 1/2 normal NPH dos the morning of the procedure.  Carry some sugar containing items with you to your appointment. A driver must accompany you and be prepared to drive you home after your procedure.  Bring all your current medications with your. An IV may be inserted and sedation may be given at the discretion of the physician.     A blood pressure cuff, EKG and other monitors will often be applied during the procedure.  Some patients may need to have extra oxygen administered for a short period. You will be asked to provide medical information, including your allergies, prior to the procedure.  We must know immediately if you are taking blood thinners (like Coumadin/Warfarin)  Or if you are allergic to IV iodine contrast (dye). We must know if you could possible be  pregnant.  Possible side-effects: Bleeding from needle site Infection (rare, may require surgery) Nerve injury (rare) Numbness & tingling (temporary) Difficulty urinating (rare, temporary) Spinal headache ( a headache worse with upright posture) Light -headedness (temporary) Pain at injection site (several days) Decreased blood pressure (temporary) Weakness in arm/leg (temporary) Pressure sensation in back/neck (temporary)  Call if you experience: Fever/chills associated with headache or increased back/neck pain. Headache worsened by an upright position. New onset weakness or numbness of an extremity below the injection site Hives or difficulty breathing (go to the emergency room) Inflammation or drainage at the infection site Severe back/neck pain Any new symptoms which are concerning to you  Please note:  Although the local anesthetic injected can often make your back or neck feel good for several hours after the injection, the pain will likely return.  It takes 3-7 days for steroids to work in the epidural space.  You may not notice any pain relief for at least that one week.  If effective, we will often do a series of three injections spaced 3-6 weeks apart to maximally decrease your pain.  After the initial series, we generally will wait several months before considering a repeat injection of the same type.  If you have any questions, please call [phone] [facility] 

## 2022-07-24 ENCOUNTER — Telehealth: Payer: Self-pay

## 2022-07-24 DIAGNOSIS — Z992 Dependence on renal dialysis: Secondary | ICD-10-CM | POA: Diagnosis not present

## 2022-07-24 DIAGNOSIS — Z23 Encounter for immunization: Secondary | ICD-10-CM | POA: Diagnosis not present

## 2022-07-24 DIAGNOSIS — N186 End stage renal disease: Secondary | ICD-10-CM | POA: Diagnosis not present

## 2022-07-24 NOTE — Telephone Encounter (Signed)
Post procedure phone call.  Patient states he is doing good.  

## 2022-07-25 DIAGNOSIS — Z992 Dependence on renal dialysis: Secondary | ICD-10-CM | POA: Diagnosis not present

## 2022-07-25 DIAGNOSIS — Z23 Encounter for immunization: Secondary | ICD-10-CM | POA: Diagnosis not present

## 2022-07-25 DIAGNOSIS — N186 End stage renal disease: Secondary | ICD-10-CM | POA: Diagnosis not present

## 2022-07-26 DIAGNOSIS — Z992 Dependence on renal dialysis: Secondary | ICD-10-CM | POA: Diagnosis not present

## 2022-07-26 DIAGNOSIS — N186 End stage renal disease: Secondary | ICD-10-CM | POA: Diagnosis not present

## 2022-07-26 DIAGNOSIS — Z23 Encounter for immunization: Secondary | ICD-10-CM | POA: Diagnosis not present

## 2022-07-27 DIAGNOSIS — Z992 Dependence on renal dialysis: Secondary | ICD-10-CM | POA: Diagnosis not present

## 2022-07-27 DIAGNOSIS — N186 End stage renal disease: Secondary | ICD-10-CM | POA: Diagnosis not present

## 2022-07-27 DIAGNOSIS — Z23 Encounter for immunization: Secondary | ICD-10-CM | POA: Diagnosis not present

## 2022-07-28 DIAGNOSIS — Z23 Encounter for immunization: Secondary | ICD-10-CM | POA: Diagnosis not present

## 2022-07-28 DIAGNOSIS — Z992 Dependence on renal dialysis: Secondary | ICD-10-CM | POA: Diagnosis not present

## 2022-07-28 DIAGNOSIS — N186 End stage renal disease: Secondary | ICD-10-CM | POA: Diagnosis not present

## 2022-07-29 DIAGNOSIS — Z992 Dependence on renal dialysis: Secondary | ICD-10-CM | POA: Diagnosis not present

## 2022-07-29 DIAGNOSIS — Z23 Encounter for immunization: Secondary | ICD-10-CM | POA: Diagnosis not present

## 2022-07-29 DIAGNOSIS — N186 End stage renal disease: Secondary | ICD-10-CM | POA: Diagnosis not present

## 2022-07-30 DIAGNOSIS — N186 End stage renal disease: Secondary | ICD-10-CM | POA: Diagnosis not present

## 2022-07-30 DIAGNOSIS — Z23 Encounter for immunization: Secondary | ICD-10-CM | POA: Diagnosis not present

## 2022-07-30 DIAGNOSIS — Z992 Dependence on renal dialysis: Secondary | ICD-10-CM | POA: Diagnosis not present

## 2022-07-31 DIAGNOSIS — Z992 Dependence on renal dialysis: Secondary | ICD-10-CM | POA: Diagnosis not present

## 2022-07-31 DIAGNOSIS — Z23 Encounter for immunization: Secondary | ICD-10-CM | POA: Diagnosis not present

## 2022-07-31 DIAGNOSIS — N186 End stage renal disease: Secondary | ICD-10-CM | POA: Diagnosis not present

## 2022-08-01 DIAGNOSIS — N186 End stage renal disease: Secondary | ICD-10-CM | POA: Diagnosis not present

## 2022-08-01 DIAGNOSIS — Z992 Dependence on renal dialysis: Secondary | ICD-10-CM | POA: Diagnosis not present

## 2022-08-02 DIAGNOSIS — N186 End stage renal disease: Secondary | ICD-10-CM | POA: Diagnosis not present

## 2022-08-02 DIAGNOSIS — Z992 Dependence on renal dialysis: Secondary | ICD-10-CM | POA: Diagnosis not present

## 2022-08-03 DIAGNOSIS — Z992 Dependence on renal dialysis: Secondary | ICD-10-CM | POA: Diagnosis not present

## 2022-08-03 DIAGNOSIS — N186 End stage renal disease: Secondary | ICD-10-CM | POA: Diagnosis not present

## 2022-08-04 DIAGNOSIS — Z992 Dependence on renal dialysis: Secondary | ICD-10-CM | POA: Diagnosis not present

## 2022-08-04 DIAGNOSIS — N186 End stage renal disease: Secondary | ICD-10-CM | POA: Diagnosis not present

## 2022-08-05 DIAGNOSIS — Z992 Dependence on renal dialysis: Secondary | ICD-10-CM | POA: Diagnosis not present

## 2022-08-05 DIAGNOSIS — N186 End stage renal disease: Secondary | ICD-10-CM | POA: Diagnosis not present

## 2022-08-06 DIAGNOSIS — Z992 Dependence on renal dialysis: Secondary | ICD-10-CM | POA: Diagnosis not present

## 2022-08-06 DIAGNOSIS — N186 End stage renal disease: Secondary | ICD-10-CM | POA: Diagnosis not present

## 2022-08-07 DIAGNOSIS — Z992 Dependence on renal dialysis: Secondary | ICD-10-CM | POA: Diagnosis not present

## 2022-08-07 DIAGNOSIS — N186 End stage renal disease: Secondary | ICD-10-CM | POA: Diagnosis not present

## 2022-08-08 ENCOUNTER — Other Ambulatory Visit: Payer: Self-pay | Admitting: Family Medicine

## 2022-08-08 DIAGNOSIS — I6523 Occlusion and stenosis of bilateral carotid arteries: Secondary | ICD-10-CM

## 2022-08-08 DIAGNOSIS — Z992 Dependence on renal dialysis: Secondary | ICD-10-CM | POA: Diagnosis not present

## 2022-08-08 DIAGNOSIS — N186 End stage renal disease: Secondary | ICD-10-CM | POA: Diagnosis not present

## 2022-08-09 ENCOUNTER — Ambulatory Visit: Payer: Medicare Other | Attending: Cardiology

## 2022-08-09 DIAGNOSIS — I6523 Occlusion and stenosis of bilateral carotid arteries: Secondary | ICD-10-CM | POA: Diagnosis not present

## 2022-08-09 DIAGNOSIS — N186 End stage renal disease: Secondary | ICD-10-CM | POA: Diagnosis not present

## 2022-08-09 DIAGNOSIS — Z992 Dependence on renal dialysis: Secondary | ICD-10-CM | POA: Diagnosis not present

## 2022-08-10 ENCOUNTER — Encounter: Payer: Self-pay | Admitting: Family Medicine

## 2022-08-10 DIAGNOSIS — Z992 Dependence on renal dialysis: Secondary | ICD-10-CM | POA: Diagnosis not present

## 2022-08-10 DIAGNOSIS — N186 End stage renal disease: Secondary | ICD-10-CM | POA: Diagnosis not present

## 2022-08-11 DIAGNOSIS — N186 End stage renal disease: Secondary | ICD-10-CM | POA: Diagnosis not present

## 2022-08-11 DIAGNOSIS — Z992 Dependence on renal dialysis: Secondary | ICD-10-CM | POA: Diagnosis not present

## 2022-08-12 DIAGNOSIS — Z992 Dependence on renal dialysis: Secondary | ICD-10-CM | POA: Diagnosis not present

## 2022-08-12 DIAGNOSIS — N186 End stage renal disease: Secondary | ICD-10-CM | POA: Diagnosis not present

## 2022-08-13 DIAGNOSIS — N186 End stage renal disease: Secondary | ICD-10-CM | POA: Diagnosis not present

## 2022-08-13 DIAGNOSIS — Z992 Dependence on renal dialysis: Secondary | ICD-10-CM | POA: Diagnosis not present

## 2022-08-14 DIAGNOSIS — Z992 Dependence on renal dialysis: Secondary | ICD-10-CM | POA: Diagnosis not present

## 2022-08-14 DIAGNOSIS — N186 End stage renal disease: Secondary | ICD-10-CM | POA: Diagnosis not present

## 2022-08-15 DIAGNOSIS — Z992 Dependence on renal dialysis: Secondary | ICD-10-CM | POA: Diagnosis not present

## 2022-08-15 DIAGNOSIS — N186 End stage renal disease: Secondary | ICD-10-CM | POA: Diagnosis not present

## 2022-08-16 DIAGNOSIS — N186 End stage renal disease: Secondary | ICD-10-CM | POA: Diagnosis not present

## 2022-08-16 DIAGNOSIS — Z992 Dependence on renal dialysis: Secondary | ICD-10-CM | POA: Diagnosis not present

## 2022-08-17 DIAGNOSIS — N186 End stage renal disease: Secondary | ICD-10-CM | POA: Diagnosis not present

## 2022-08-17 DIAGNOSIS — Z992 Dependence on renal dialysis: Secondary | ICD-10-CM | POA: Diagnosis not present

## 2022-08-18 DIAGNOSIS — Z992 Dependence on renal dialysis: Secondary | ICD-10-CM | POA: Diagnosis not present

## 2022-08-18 DIAGNOSIS — N186 End stage renal disease: Secondary | ICD-10-CM | POA: Diagnosis not present

## 2022-08-19 DIAGNOSIS — N186 End stage renal disease: Secondary | ICD-10-CM | POA: Diagnosis not present

## 2022-08-19 DIAGNOSIS — Z992 Dependence on renal dialysis: Secondary | ICD-10-CM | POA: Diagnosis not present

## 2022-08-20 DIAGNOSIS — N186 End stage renal disease: Secondary | ICD-10-CM | POA: Diagnosis not present

## 2022-08-20 DIAGNOSIS — Z992 Dependence on renal dialysis: Secondary | ICD-10-CM | POA: Diagnosis not present

## 2022-08-21 DIAGNOSIS — Z992 Dependence on renal dialysis: Secondary | ICD-10-CM | POA: Diagnosis not present

## 2022-08-21 DIAGNOSIS — N186 End stage renal disease: Secondary | ICD-10-CM | POA: Diagnosis not present

## 2022-08-22 ENCOUNTER — Ambulatory Visit
Payer: Medicare Other | Attending: Student in an Organized Health Care Education/Training Program | Admitting: Student in an Organized Health Care Education/Training Program

## 2022-08-22 DIAGNOSIS — M5124 Other intervertebral disc displacement, thoracic region: Secondary | ICD-10-CM

## 2022-08-22 DIAGNOSIS — G8929 Other chronic pain: Secondary | ICD-10-CM | POA: Diagnosis not present

## 2022-08-22 DIAGNOSIS — M4804 Spinal stenosis, thoracic region: Secondary | ICD-10-CM

## 2022-08-22 DIAGNOSIS — N186 End stage renal disease: Secondary | ICD-10-CM | POA: Diagnosis not present

## 2022-08-22 DIAGNOSIS — M546 Pain in thoracic spine: Secondary | ICD-10-CM | POA: Diagnosis not present

## 2022-08-22 DIAGNOSIS — M47894 Other spondylosis, thoracic region: Secondary | ICD-10-CM

## 2022-08-22 DIAGNOSIS — Z992 Dependence on renal dialysis: Secondary | ICD-10-CM | POA: Diagnosis not present

## 2022-08-22 NOTE — Progress Notes (Signed)
Patient: Bradley Black  Service Category: E/M  Provider: Gillis Santa, MD  DOB: [birth year]  DOS: 08/22/2022  Location: Office  MRN: [id number]  Setting: Ambulatory outpatient  Referring Provider: Susy Frizzle, MD  Type: Established Patient  Specialty: Interventional Pain Management  PCP: Susy Frizzle, MD  Location: Remote location  Delivery: TeleHealth     Virtual Encounter - Pain Management PROVIDER NOTE: Information contained herein reflects review and annotations entered in association with encounter. Interpretation of such information and data should be left to medically-trained personnel. Information provided to patient can be located elsewhere in the medical record under "Patient Instructions". Document created using STT-dictation technology, any transcriptional errors that may result from process are unintentional.    Contact & Pharmacy Preferred: [place]: [phone] (home) Mobile: [phone] (mobile) E-mail: No e-mail address on record  [place], [place] [address] Phone: [phone] Fax: [phone]  [place], [name] [address] Phone: [phone] Fax: [place], [name] [address] Phone: [phone] Fax: [phone]   Pre-screening  Mr. Bell offered "in-person" vs "virtual" encounter. He indicated preferring virtual for this encounter.   Reason COVID-19*  Social distancing based on CDC and AMA recommendations.   I contacted Bradley Black on 08/22/2022 via telephone.      I clearly identified myself as Gillis Santa, MD. I verified that I was speaking with the correct person using two identifiers (Name: Bradley Black, and date of birth: 1945-11-23).  Consent I sought verbal advanced consent from Bradley Black for virtual visit interactions. I informed Bradley Black  of possible security and privacy concerns, risks, and limitations associated with providing "not-in-person" medical evaluation and management services. I also informed Bradley Black of the availability of "in-person" appointments. Finally, I informed him that there would be a charge for the virtual visit and that he could be  personally, fully or partially, financially responsible for it. Bradley Black expressed understanding and agreed to proceed.   Historic Elements   Bradley Black is a 76 y.o. year old, male patient evaluated today after our last contact on 07/23/2022. 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 He also  has a past surgical history that includes Back surgery (1970); Mass excision (10/24/11); Lumbar fusion; Coronary angioplasty (1994); Coronary angioplasty (1997); Coronary angioplasty with stent (1999); Coronary angioplasty with stent (2001); Incision and drainage of wound (2006); Hernia repair; Coronary artery bypass graft (12/27/2011); Intraoprative transesophageal echocardiogram (12/27/2011); left heart catheterization with coronary angiogram (N/A, 12/13/2011); transesophageal echocardiogram (2013); Cardiac catheterization (2013); RIGHT HEART CATH (N/A, 07/12/2020); Insertion of dialysis catheter (Right, 07/25/2020); AV fistula placement (Left, 07/28/2020); Colonoscopy with propofol (N/A, 01/25/2022); Esophagogastroduodenoscopy (egd) with propofol (N/A, 01/25/2022); biopsy (01/25/2022); and  polypectomy (01/25/2022). Bradley Black has a current medication list which includes the following prescription(s): aspirin ec, brimonidine, vitamin d3, citalopram, desvenlafaxine, diltiazem, doxazosin, entresto, furosemide, hydralazine, levothyroxine, multivitamin, pantoprazole, pravastatin, sildenafil, sulfamethoxazole-trimethoprim, and torsemide. He  reports that  he quit smoking about 35 years ago. His smoking use included cigarettes. He has never used smokeless tobacco. He reports that he does not drink alcohol and does not use drugs. Bradley Black has No Known Allergies.   HPI  Today, he is being contacted for a post-procedure assessment.   Post-procedure evaluation   Inter-Laminar Thoracic Epidural Steroid Block/Injection  #1  Laterality:  Midline Level: T11-12  Imaging: Fluoroscopic guidance Anesthesia: Local anesthesia (1-2% Lidocaine) DOS: 07/23/2022 Performed by: Gillis Santa, MD  Purpose: Diagnostic/Therapeutic Indications: Thoracic back pain, radicular pain, with degenerative disc disease severe enough to impact quality of life or function. 1. 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 Chronic thoracic spine pain    NAS-11 Pain score:   Pre-procedure: 3 /10   Post-procedure: 1 /10      Effectiveness:  Initial hour after procedure: 80 %  Subsequent 4-6 hours post-procedure: 80 %  Analgesia past initial 6 hours: 10 % (80 % improvement first couple of days.)  Ongoing improvement: Analgesic:  10-20% Function: Back to baseline ROM: Back to baseline   Laboratory Chemistry Profile   Renal Lab Results  Component Value Date   BUN 53 (H) 05/28/2022   CREATININE 3.51 (H) 05/28/2022   LABCREA 34.25 07/09/2020   BCR 18 02/15/2022   GFRAA 24 (L) 05/01/2019   GFRNONAA 17 (L) 05/28/2022    Hepatic Lab Results  Component Value Date   AST 28 05/28/2022   ALT 31 05/28/2022   ALBUMIN 3.3 (L) 05/28/2022   ALKPHOS 93 05/28/2022   HCVAB NON  REACTIVE 07/21/2020    Electrolytes Lab Results  Component Value Date   NA 135 05/28/2022   K 4.6 05/28/2022   CL 107 05/28/2022   CALCIUM 8.8 (L) 05/28/2022   MG 2.2 04/20/2021   PHOS 4.6 08/02/2020    Bone Lab Results  Component Value Date   VD25OH 23.20 (L) 04/20/2021    Inflammation (CRP: Acute Phase) (ESR: Chronic Phase) Lab Results  Component Value Date   LATICACIDVEN 1.1 05/28/2022         Note: Above Lab results reviewed.  Imaging  VAS US CAROTID Carotid Arterial Duplex Study  Patient Name:  Bradley Black  Date of Exam:   08/09/2022 Medical Rec #: [id number]        Accession #:    [id number] Date of Birth: 1946-06-23        Patient Gender: M Patient Age:   76 years Exam Location:  [facility] Procedure:      VAS US CAROTID Referring Phys: Jenna Luo  --------------------------------------------------------------------------------   Indications:       Carotid artery disease. Risk Factors:      Hypertension, hyperlipidemia, past history of smoking. Comparison Study:  On 05/13/21 carotid Duplex showed 1-39% RICA stenosis and                    86-57% LICA stenosis. The left peak systolic velocity was 846                    cm/s and end diastolic velocity was 57 cm/s.  Performing Technologist: Jeneen Montgomery RDMS, RVT, RDCS    Examination Guidelines: A complete evaluation includes B-mode imaging, spectral Doppler, color Doppler, and power Doppler as needed of all accessible portions of each vessel. Bilateral testing is considered an integral part of a complete examination. Limited examinations for reoccurring indications may be performed as noted.  Right Carotid Findings: +----------+--------+--------+--------+-------------------------+---------+           PSV cm/sEDV cm/sStenosisPlaque Description       Comments  +----------+--------+--------+--------+-------------------------+---------+ CCA Prox  106     23                                                  +----------+--------+--------+--------+-------------------------+---------+ CCA Distal59      14                                                 +----------+--------+--------+--------+-------------------------+---------+ ICA Prox  138     27      1-39%   calcific and heterogenousShadowing +----------+--------+--------+--------+-------------------------+---------+ ICA Mid   81      29                                                 +----------+--------+--------+--------+-------------------------+---------+ ICA Distal75      30                                                 +----------+--------+--------+--------+-------------------------+---------+ ECA       283     33      >50%    calcific and heterogenous          +----------+--------+--------+--------+-------------------------+---------+  +----------+--------+-------+----------------+-------------------+           PSV cm/sEDV cmsDescribe        Arm Pressure (mmHG) +----------+--------+-------+----------------+-------------------+ Subclavian108     0      Multiphasic, [id number]                 +----------+--------+-------+----------------+-------------------+  +---------+--------+--+--------+--+---------+ VertebralPSV cm/s83EDV cm/s26Antegrade +---------+--------+--+--------+--+---------+     Left Carotid Findings: +----------+-------+--------+--------+-----------------------+-----------------+           PSV    EDV cm/sStenosisPlaque Description     Comments                    cm/s                                                            +----------+-------+--------+--------+-----------------------+-----------------+ CCA Prox  73     19                                                       +----------+-------+--------+--------+-----------------------+-----------------+ CCA Distal45     16                                                        +----------+-------+--------+--------+-----------------------+-----------------+  ICA Prox  49     14                                                       +----------+-------+--------+--------+-----------------------+-----------------+ ICA Mid   171    60      40-59%  calcific and           upper end of                                       heterogenous           scale             +----------+-------+--------+--------+-----------------------+-----------------+ ICA Distal173    50                                                       +----------+-------+--------+--------+-----------------------+-----------------+ ECA       469    68      >50%    calcific and           shadowing                                          heterogenous                             +----------+-------+--------+--------+-----------------------+-----------------+  +----------+--------+--------+----------------+-------------------+           PSV cm/sEDV cm/sDescribe        Arm Pressure (mmHG) +----------+--------+--------+----------------+-------------------+ [id number]     0       Multiphasic, [id number]                 +----------+--------+--------+----------------+-------------------+  +---------+--------+---+--------+--+---------+ VertebralPSV cm/s114EDV cm/s30Antegrade +---------+--------+---+--------+--+---------+        Summary: Right Carotid: Velocities in the right ICA are consistent with a 1-39% stenosis.                The ECA appears >50% stenosed.  Left Carotid: Velocities in the left ICA are consistent with a 40-59% stenosis.               The ECA appears >50% stenosed. Upper end of scale.  Vertebrals:  Bilateral vertebral arteries demonstrate antegrade flow. Subclavians: Normal flow hemodynamics were seen in bilateral subclavian              arteries.  *See table(s) above for measurements and observations. Suggest follow up study  in 12 months.  Electronically signed by Kathlyn Sacramento MD on 08/10/2022 at 2:17:31 PM.      Final    Assessment  The primary encounter diagnosis was Thoracic disc herniation. Diagnoses of Foraminal stenosis of thoracic region (T7/T8), Chronic thoracic spine pain, and Thoracic facet joint syndrome (moderate T7-T8) were also pertinent to this visit.  Plan of Care  Unfortunately no benefit with with T-ESI.   Follow-up plan:   No follow-ups on file.     B/L T7,8,9 MBNB #1 05/09/22, T11/12  ESI 07/23/22   Recent Visits Date Type Provider Dept  07/23/22 Procedure visit Gillis Santa, MD [facility]  07/03/22 Office Visit Gillis Santa, MD [facility]  Showing recent visits within past 90 days and meeting all other requirements Today's Visits Date Type Provider Dept  08/22/22 Office Visit Gillis Santa, MD [facility]  Showing today's visits and meeting all other requirements Future Appointments No visits were found meeting these conditions. Showing future appointments within next 90 days and meeting all other requirements  I discussed the assessment and treatment plan with the patient. The patient was provided an opportunity to ask questions and all were answered. The patient agreed with the plan and demonstrated an understanding of the instructions.  Patient advised to call back or seek an in-person evaluation if the symptoms or condition worsens.  Duration of encounter: 10mnutes.  Note by: BGillis Santa MD Date: 08/22/2022; Time: 3:37 PM

## 2022-08-23 DIAGNOSIS — Z992 Dependence on renal dialysis: Secondary | ICD-10-CM | POA: Diagnosis not present

## 2022-08-23 DIAGNOSIS — N186 End stage renal disease: Secondary | ICD-10-CM | POA: Diagnosis not present

## 2022-08-24 DIAGNOSIS — N186 End stage renal disease: Secondary | ICD-10-CM | POA: Diagnosis not present

## 2022-08-24 DIAGNOSIS — Z992 Dependence on renal dialysis: Secondary | ICD-10-CM | POA: Diagnosis not present

## 2022-08-25 DIAGNOSIS — N186 End stage renal disease: Secondary | ICD-10-CM | POA: Diagnosis not present

## 2022-08-25 DIAGNOSIS — Z992 Dependence on renal dialysis: Secondary | ICD-10-CM | POA: Diagnosis not present

## 2022-08-26 DIAGNOSIS — Z992 Dependence on renal dialysis: Secondary | ICD-10-CM | POA: Diagnosis not present

## 2022-08-26 DIAGNOSIS — N186 End stage renal disease: Secondary | ICD-10-CM | POA: Diagnosis not present

## 2022-08-27 DIAGNOSIS — Z992 Dependence on renal dialysis: Secondary | ICD-10-CM | POA: Diagnosis not present

## 2022-08-27 DIAGNOSIS — N186 End stage renal disease: Secondary | ICD-10-CM | POA: Diagnosis not present

## 2022-08-28 DIAGNOSIS — N186 End stage renal disease: Secondary | ICD-10-CM | POA: Diagnosis not present

## 2022-08-28 DIAGNOSIS — Z992 Dependence on renal dialysis: Secondary | ICD-10-CM | POA: Diagnosis not present

## 2022-08-29 DIAGNOSIS — Z992 Dependence on renal dialysis: Secondary | ICD-10-CM | POA: Diagnosis not present

## 2022-08-29 DIAGNOSIS — N186 End stage renal disease: Secondary | ICD-10-CM | POA: Diagnosis not present

## 2022-08-30 DIAGNOSIS — N186 End stage renal disease: Secondary | ICD-10-CM | POA: Diagnosis not present

## 2022-08-30 DIAGNOSIS — Z992 Dependence on renal dialysis: Secondary | ICD-10-CM | POA: Diagnosis not present

## 2022-08-31 DIAGNOSIS — Z992 Dependence on renal dialysis: Secondary | ICD-10-CM | POA: Diagnosis not present

## 2022-08-31 DIAGNOSIS — N186 End stage renal disease: Secondary | ICD-10-CM | POA: Diagnosis not present

## 2022-09-01 DIAGNOSIS — N186 End stage renal disease: Secondary | ICD-10-CM | POA: Diagnosis not present

## 2022-09-01 DIAGNOSIS — Z992 Dependence on renal dialysis: Secondary | ICD-10-CM | POA: Diagnosis not present

## 2022-09-02 DIAGNOSIS — Z992 Dependence on renal dialysis: Secondary | ICD-10-CM | POA: Diagnosis not present

## 2022-09-02 DIAGNOSIS — N186 End stage renal disease: Secondary | ICD-10-CM | POA: Diagnosis not present

## 2022-09-03 DIAGNOSIS — Z992 Dependence on renal dialysis: Secondary | ICD-10-CM | POA: Diagnosis not present

## 2022-09-03 DIAGNOSIS — H35341 Macular cyst, hole, or pseudohole, right eye: Secondary | ICD-10-CM | POA: Diagnosis not present

## 2022-09-03 DIAGNOSIS — H01001 Unspecified blepharitis right upper eyelid: Secondary | ICD-10-CM | POA: Diagnosis not present

## 2022-09-03 DIAGNOSIS — H2513 Age-related nuclear cataract, bilateral: Secondary | ICD-10-CM | POA: Diagnosis not present

## 2022-09-03 DIAGNOSIS — H01002 Unspecified blepharitis right lower eyelid: Secondary | ICD-10-CM | POA: Diagnosis not present

## 2022-09-03 DIAGNOSIS — N186 End stage renal disease: Secondary | ICD-10-CM | POA: Diagnosis not present

## 2022-09-03 DIAGNOSIS — H25812 Combined forms of age-related cataract, left eye: Secondary | ICD-10-CM | POA: Diagnosis not present

## 2022-09-04 DIAGNOSIS — Z992 Dependence on renal dialysis: Secondary | ICD-10-CM | POA: Diagnosis not present

## 2022-09-04 DIAGNOSIS — N186 End stage renal disease: Secondary | ICD-10-CM | POA: Diagnosis not present

## 2022-09-05 DIAGNOSIS — N186 End stage renal disease: Secondary | ICD-10-CM | POA: Diagnosis not present

## 2022-09-05 DIAGNOSIS — Z992 Dependence on renal dialysis: Secondary | ICD-10-CM | POA: Diagnosis not present

## 2022-09-06 DIAGNOSIS — Z992 Dependence on renal dialysis: Secondary | ICD-10-CM | POA: Diagnosis not present

## 2022-09-06 DIAGNOSIS — N186 End stage renal disease: Secondary | ICD-10-CM | POA: Diagnosis not present

## 2022-09-07 DIAGNOSIS — N186 End stage renal disease: Secondary | ICD-10-CM | POA: Diagnosis not present

## 2022-09-07 DIAGNOSIS — Z992 Dependence on renal dialysis: Secondary | ICD-10-CM | POA: Diagnosis not present

## 2022-09-07 NOTE — H&P (Signed)
Surgical History & Physical  Patient Name: Bradley Black DOB: August 24, 1946  Surgery: Cataract extraction with intraocular lens implant phacoemulsification; Left Eye  Surgeon: Baruch Goldmann MD Surgery Date:  09-14-22 Pre-Op Date:  09-03-22  HPI: A 64 Yr. old male patient 1. The patient complains of difficulty when seeing street signs, which began 12 months ago. Both eyes are affected. The episode is constant. The patient describes foggy, glare and hazy symptoms affecting their eyes/vision. The condition's severity is worsening. is here for a cataract re-evaluation of both eyes. Referred from Dr. Rosana Hoes. The complaint is associated with light sensitivity. Patient complains of difficulty reading small print on medicine bottles/labels, difficulty reading traffic signs/street signs, and driving at night due to glare from headlights. This is negatively affecting the patient's quality of life and the patient is unable to function adequately in life with the current level of vision. HPI was performed by Baruch Goldmann .  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) Diabetes High Blood Pressure Kidney failure(dialysis)  Review of Systems Negative Allergic/Immunologic Negative Cardiovascular Negative Constitutional Negative Ear, Nose, Mouth & Throat Negative Endocrine Negative Eyes Negative Gastrointestinal Negative Genitourinary Negative Hemotologic/Lymphatic Negative Integumentary Negative Musculoskeletal Negative Neurological Negative Psychiatry Negative Respiratory  Social   Former smoker   Medication Azor, Celebrex, Citalopram, Doxazosin, Januvia, Pantoprazole, Pravastatin, Sodium,   Sx/Procedures Heart sx, Back Surgery, Hernia Sx,   Drug Allergies   NKDA  History & Physical: Heent: Cataract, left eye NECK: supple without bruits LUNGS: lungs clear to auscultation CV: regular rate and  rhythm Abdomen: soft and non-tender Impression & Plan: Assessment: 1.  NUCLEAR SCLEROSIS AGE RELATED; Both Eyes (H25.13) 2.  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  Pinguecula; Both Eyes (H11.153)  Plan: 1.  Cataract accounts for the patient's decreased vision. This visual impairment is not correctable with a tolerable change in glasses or contact lenses. Cataract surgery with an implantation of a new lens should significantly improve the visual and functional status of the patient. Discussed all risks, benefits, alternatives, and potential complications. Discussed the procedures and recovery. Patient desires to have surgery. A-scan ordered and performed today for intra-ocular lens calculations. The surgery will be performed in order to improve vision for driving, reading, and for eye examinations. Recommend phacoemulsification with intra-ocular lens. Recommend Dextenza for post-operative pain and inflammation. Left Eye worse - first.. Dilates poorly - shugarcaine by protocol. Malyugin Ring. Omidira.  2.  Blepharitis- Recommend Artificial tears 4 x day and warm compresses with lid scrubs 2-3 x week. Patient may also benefit from omega-3 vitamin and tear gel/ointment at night. Avoid directing fans or vents on the eyes and consider a humidifier in the winter months. Patient understands this may not cure the problem and may need to be on a maintenance regimen to control the problem.  3.  On OCT. Stable. Monitor.  4.  Observe; Artificial tears as needed for irritation.

## 2022-09-08 DIAGNOSIS — N186 End stage renal disease: Secondary | ICD-10-CM | POA: Diagnosis not present

## 2022-09-08 DIAGNOSIS — Z992 Dependence on renal dialysis: Secondary | ICD-10-CM | POA: Diagnosis not present

## 2022-09-09 DIAGNOSIS — Z992 Dependence on renal dialysis: Secondary | ICD-10-CM | POA: Diagnosis not present

## 2022-09-09 DIAGNOSIS — N186 End stage renal disease: Secondary | ICD-10-CM | POA: Diagnosis not present

## 2022-09-10 DIAGNOSIS — N186 End stage renal disease: Secondary | ICD-10-CM | POA: Diagnosis not present

## 2022-09-10 DIAGNOSIS — Z992 Dependence on renal dialysis: Secondary | ICD-10-CM | POA: Diagnosis not present

## 2022-09-11 ENCOUNTER — Encounter (HOSPITAL_COMMUNITY): Payer: Self-pay

## 2022-09-11 ENCOUNTER — Other Ambulatory Visit: Payer: Self-pay

## 2022-09-11 ENCOUNTER — Encounter (HOSPITAL_COMMUNITY)
Admission: RE | Admit: 2022-09-11 | Discharge: 2022-09-11 | Disposition: A | Discharge status: Home / Self Care | Payer: Medicare Other | Source: Ambulatory Visit | Attending: Ophthalmology | Admitting: Ophthalmology

## 2022-09-11 DIAGNOSIS — N186 End stage renal disease: Secondary | ICD-10-CM | POA: Diagnosis not present

## 2022-09-11 DIAGNOSIS — Z992 Dependence on renal dialysis: Secondary | ICD-10-CM | POA: Diagnosis not present

## 2022-09-11 HISTORY — DX: Acute myocardial infarction, unspecified: I21.9

## 2022-09-11 NOTE — Pre-Procedure Instructions (Signed)
Attempted pre-op phone call. Left VM for him to call us back. 

## 2022-09-12 DIAGNOSIS — Z992 Dependence on renal dialysis: Secondary | ICD-10-CM | POA: Diagnosis not present

## 2022-09-12 DIAGNOSIS — N186 End stage renal disease: Secondary | ICD-10-CM | POA: Diagnosis not present

## 2022-09-13 DIAGNOSIS — Z992 Dependence on renal dialysis: Secondary | ICD-10-CM | POA: Diagnosis not present

## 2022-09-13 DIAGNOSIS — N186 End stage renal disease: Secondary | ICD-10-CM | POA: Diagnosis not present

## 2022-09-14 ENCOUNTER — Ambulatory Visit (HOSPITAL_BASED_OUTPATIENT_CLINIC_OR_DEPARTMENT_OTHER): Payer: Medicare Other | Admitting: Anesthesiology

## 2022-09-14 ENCOUNTER — Encounter (HOSPITAL_COMMUNITY)
Admission: RE | Disposition: A | Discharge status: Home / Self Care | Payer: Self-pay | Source: Home / Self Care | Attending: Ophthalmology

## 2022-09-14 ENCOUNTER — Ambulatory Visit (HOSPITAL_COMMUNITY)
Admission: RE | Admit: 2022-09-14 | Discharge: 2022-09-14 | Disposition: A | Discharge status: Home / Self Care | Payer: Medicare Other | Attending: Ophthalmology | Admitting: Ophthalmology

## 2022-09-14 ENCOUNTER — Ambulatory Visit (HOSPITAL_COMMUNITY): Payer: Medicare Other | Admitting: Anesthesiology

## 2022-09-14 ENCOUNTER — Encounter (HOSPITAL_COMMUNITY): Payer: Self-pay | Admitting: Ophthalmology

## 2022-09-14 DIAGNOSIS — D759 Disease of blood and blood-forming organs, unspecified: Secondary | ICD-10-CM | POA: Diagnosis not present

## 2022-09-14 DIAGNOSIS — N189 Chronic kidney disease, unspecified: Secondary | ICD-10-CM | POA: Insufficient documentation

## 2022-09-14 DIAGNOSIS — Z992 Dependence on renal dialysis: Secondary | ICD-10-CM

## 2022-09-14 DIAGNOSIS — N186 End stage renal disease: Secondary | ICD-10-CM | POA: Diagnosis not present

## 2022-09-14 DIAGNOSIS — H0100A Unspecified blepharitis right eye, upper and lower eyelids: Secondary | ICD-10-CM | POA: Diagnosis not present

## 2022-09-14 DIAGNOSIS — H2512 Age-related nuclear cataract, left eye: Secondary | ICD-10-CM | POA: Diagnosis not present

## 2022-09-14 DIAGNOSIS — H25812 Combined forms of age-related cataract, left eye: Secondary | ICD-10-CM | POA: Insufficient documentation

## 2022-09-14 DIAGNOSIS — D649 Anemia, unspecified: Secondary | ICD-10-CM | POA: Diagnosis not present

## 2022-09-14 DIAGNOSIS — H35341 Macular cyst, hole, or pseudohole, right eye: Secondary | ICD-10-CM | POA: Diagnosis not present

## 2022-09-14 DIAGNOSIS — I252 Old myocardial infarction: Secondary | ICD-10-CM | POA: Insufficient documentation

## 2022-09-14 DIAGNOSIS — H11153 Pinguecula, bilateral: Secondary | ICD-10-CM | POA: Insufficient documentation

## 2022-09-14 DIAGNOSIS — I2510 Atherosclerotic heart disease of native coronary artery without angina pectoris: Secondary | ICD-10-CM | POA: Insufficient documentation

## 2022-09-14 DIAGNOSIS — Z951 Presence of aortocoronary bypass graft: Secondary | ICD-10-CM | POA: Diagnosis not present

## 2022-09-14 DIAGNOSIS — I129 Hypertensive chronic kidney disease with stage 1 through stage 4 chronic kidney disease, or unspecified chronic kidney disease: Secondary | ICD-10-CM | POA: Insufficient documentation

## 2022-09-14 DIAGNOSIS — Z87891 Personal history of nicotine dependence: Secondary | ICD-10-CM

## 2022-09-14 DIAGNOSIS — E1136 Type 2 diabetes mellitus with diabetic cataract: Secondary | ICD-10-CM | POA: Diagnosis not present

## 2022-09-14 DIAGNOSIS — E1122 Type 2 diabetes mellitus with diabetic chronic kidney disease: Secondary | ICD-10-CM | POA: Diagnosis not present

## 2022-09-14 DIAGNOSIS — I120 Hypertensive chronic kidney disease with stage 5 chronic kidney disease or end stage renal disease: Secondary | ICD-10-CM | POA: Diagnosis not present

## 2022-09-14 HISTORY — PX: CATARACT EXTRACTION W/PHACO: SHX586

## 2022-09-14 SURGERY — PHACOEMULSIFICATION, CATARACT, WITH IOL INSERTION
Anesthesia: Monitor Anesthesia Care | Site: Eye | Laterality: Left

## 2022-09-14 MED ORDER — PHENYLEPHRINE HCL 2.5 % OP SOLN
1.0000 [drp] | OPHTHALMIC | Status: AC | PRN
  Administered 2022-09-14 (×3): 1 [drp] via OPHTHALMIC

## 2022-09-14 MED ORDER — SODIUM HYALURONATE 23MG/ML IO SOSY
PREFILLED_SYRINGE | INTRAOCULAR | Status: DC | PRN
  Administered 2022-09-14: .6 mL via INTRAOCULAR

## 2022-09-14 MED ORDER — PHENYLEPHRINE-KETOROLAC 1-0.3 % IO SOLN
INTRAOCULAR | Status: AC
  Filled 2022-09-14: qty 4

## 2022-09-14 MED ORDER — TETRACAINE HCL 0.5 % OP SOLN
1.0000 [drp] | OPHTHALMIC | Status: AC | PRN
  Administered 2022-09-14 (×3): 1 [drp] via OPHTHALMIC

## 2022-09-14 MED ORDER — LIDOCAINE HCL 3.5 % OP GEL
1.0000 | Freq: Once | OPHTHALMIC | Status: AC
  Administered 2022-09-14: 1 via OPHTHALMIC

## 2022-09-14 MED ORDER — LIDOCAINE HCL (PF) 1 % IJ SOLN
INTRAOCULAR | Status: DC | PRN
  Administered 2022-09-14: 1 mL via OPHTHALMIC

## 2022-09-14 MED ORDER — POVIDONE-IODINE 5 % OP SOLN
OPHTHALMIC | Status: DC | PRN
  Administered 2022-09-14: 1 via OPHTHALMIC

## 2022-09-14 MED ORDER — STERILE WATER FOR IRRIGATION IR SOLN
Status: DC | PRN
  Administered 2022-09-14: 250 mL

## 2022-09-14 MED ORDER — MIDAZOLAM HCL 5 MG/5ML IJ SOLN
INTRAMUSCULAR | Status: DC | PRN
  Administered 2022-09-14: 1 mg via INTRAVENOUS

## 2022-09-14 MED ORDER — TROPICAMIDE 1 % OP SOLN
1.0000 [drp] | OPHTHALMIC | Status: AC | PRN
  Administered 2022-09-14 (×3): 1 [drp] via OPHTHALMIC

## 2022-09-14 MED ORDER — PHENYLEPHRINE-KETOROLAC 1-0.3 % IO SOLN
INTRAOCULAR | Status: DC | PRN
  Administered 2022-09-14: 500 mL via OPHTHALMIC

## 2022-09-14 MED ORDER — BSS IO SOLN
INTRAOCULAR | Status: DC | PRN
  Administered 2022-09-14: 15 mL via INTRAOCULAR

## 2022-09-14 MED ORDER — SODIUM CHLORIDE 0.9% FLUSH
INTRAVENOUS | Status: DC | PRN
  Administered 2022-09-14: 10 mL via INTRAVENOUS

## 2022-09-14 MED ORDER — MOXIFLOXACIN HCL 0.5 % OP SOLN
OPHTHALMIC | Status: DC | PRN
  Administered 2022-09-14: .2 mL via OPHTHALMIC

## 2022-09-14 MED ORDER — SODIUM HYALURONATE 10 MG/ML IO SOLUTION
PREFILLED_SYRINGE | INTRAOCULAR | Status: DC | PRN
  Administered 2022-09-14: .85 mL via INTRAOCULAR

## 2022-09-14 SURGICAL SUPPLY — 15 items
CATARACT SUITE SIGHTPATH (MISCELLANEOUS) ×1 IMPLANT
CLOTH BEACON ORANGE TIMEOUT ST (SAFETY) ×1 IMPLANT
EYE SHIELD UNIVERSAL CLEAR (GAUZE/BANDAGES/DRESSINGS) IMPLANT
FEE CATARACT SUITE SIGHTPATH (MISCELLANEOUS) ×1 IMPLANT
GLOVE BIOGEL PI IND STRL 7.0 (GLOVE) ×2 IMPLANT
LENS IOL RAYNER 23.0 (Intraocular Lens) ×1 IMPLANT
LENS IOL RAYONE EMV 23.0 (Intraocular Lens) IMPLANT
NDL HYPO 18GX1.5 BLUNT FILL (NEEDLE) ×1 IMPLANT
NEEDLE HYPO 18GX1.5 BLUNT FILL (NEEDLE) ×1 IMPLANT
PAD ARMBOARD 7.5X6 YLW CONV (MISCELLANEOUS) ×1 IMPLANT
RING MALYGIN 7.0 (MISCELLANEOUS) IMPLANT
SYR TB 1ML LL NO SAFETY (SYRINGE) ×1 IMPLANT
TAPE SURG TRANSPORE 1 IN (GAUZE/BANDAGES/DRESSINGS) IMPLANT
TAPE SURGICAL TRANSPORE 1 IN (GAUZE/BANDAGES/DRESSINGS) ×1
WATER STERILE IRR 250ML POUR (IV SOLUTION) ×1 IMPLANT

## 2022-09-14 NOTE — Interval H&P Note (Signed)
History and Physical Interval Note:  09/14/2022 10:56 AM  Bradley Black  has presented today for surgery, with the diagnosis of nuclear sclerosis age related cataract; left.  The various methods of treatment have been discussed with the patient and family. After consideration of risks, benefits and other options for treatment, the patient has consented to  Procedure(s) with comments: CATARACT EXTRACTION PHACO AND INTRAOCULAR LENS PLACEMENT (IOC) (Left) - CDE: as a surgical intervention.  The patient's history has been reviewed, patient examined, no change in status, stable for surgery.  I have reviewed the patient's chart and labs.  Questions were answered to the patient's satisfaction.     Baruch Goldmann

## 2022-09-14 NOTE — Transfer of Care (Addendum)
Immediate Anesthesia Transfer of Care Note  Patient: Bradley Black  Procedure(s) Performed: CATARACT EXTRACTION PHACO AND INTRAOCULAR LENS PLACEMENT (IOC) (Left: Eye)  Patient Location: Short Stay  Anesthesia Type:MAC  Level of Consciousness: awake  Airway & Oxygen Therapy: Patient Spontanous Breathing  Post-op Assessment: Report given to RN  Post vital signs: Reviewed and stable  Last Vitals:  Vitals Value Taken Time  BP 147/67 09/14/22 1126  Temp 36.6 C 09/14/22 1126  Pulse 52 09/14/22 1126  Resp 10 09/14/22 1126  SpO2 100 % 09/14/22 1126    Last Pain:  Vitals:   09/14/22 1126  TempSrc: Oral  PainSc: 2       Patients Stated Pain Goal: 5 (02/58/52 7782)  Complications: No notable events documented.

## 2022-09-14 NOTE — Anesthesia Preprocedure Evaluation (Signed)
Anesthesia Evaluation  Patient identified by MRN, date of birth, ID band Patient awake    Reviewed: Allergy & Precautions, H&P , NPO status , Patient's Chart, lab work & pertinent test results, reviewed documented beta blocker date and time   Airway Mallampati: II  TM Distance: >3 FB Neck ROM: full    Dental no notable dental hx.    Pulmonary neg pulmonary ROS, former smoker   Pulmonary exam normal breath sounds clear to auscultation       Cardiovascular Exercise Tolerance: Good hypertension, + CAD, + Past MI and + CABG   Rhythm:regular Rate:Normal     Neuro/Psych negative neurological ROS  negative psych ROS   GI/Hepatic Neg liver ROS,GERD  Medicated,,  Endo/Other  negative endocrine ROSdiabetes    Renal/GU CRFRenal disease  negative genitourinary   Musculoskeletal   Abdominal   Peds  Hematology  (+) Blood dyscrasia, anemia   Anesthesia Other Findings   Reproductive/Obstetrics negative OB ROS                             Anesthesia Physical Anesthesia Plan  ASA: 3  Anesthesia Plan: MAC   Post-op Pain Management:    Induction:   PONV Risk Score and Plan:   Airway Management Planned:   Additional Equipment:   Intra-op Plan:   Post-operative Plan:   Informed Consent: I have reviewed the patients History and Physical, chart, labs and discussed the procedure including the risks, benefits and alternatives for the proposed anesthesia with the patient or authorized representative who has indicated his/her understanding and acceptance.     Dental Advisory Given  Plan Discussed with: CRNA  Anesthesia Plan Comments:        Anesthesia Quick Evaluation

## 2022-09-14 NOTE — Op Note (Signed)
Date of procedure: 09/14/22  Pre-operative diagnosis: Visually significant age-related combined cataract, Left Eye (H25.812)  Post-operative diagnosis: Visually significant age-related combined cataract, Left Eye (H25.812)  Procedure: Removal of cataract via phacoemulsification and insertion of intra-ocular lens Rayner [id number] +23.0D into the capsular bag of the Left Eye  Attending surgeon: Gerda Diss. Raea Magallon, MD, MA  Anesthesia: MAC, Topical [place]  Complications: None  Estimated Blood Loss: <69m (minimal)  Specimens: None  Implants: As above  Indications:  Visually significant age-related cataract, Left Eye  Procedure:  The patient was seen and identified in the pre-operative area. The operative eye was identified and dilated.  The operative eye was marked.  Topical anesthesia was administered to the operative eye.     The patient was then to the operative suite and placed in the supine position.  A timeout was performed confirming the patient, procedure to be performed, and all other relevant information.   The patient's face was prepped and draped in the usual fashion for intra-ocular surgery.  A lid speculum was placed into the operative eye and the surgical microscope moved into place and focused.  An inferotemporal paracentesis was created using a 20 gauge paracentesis blade.  Shugarcaine was injected into the anterior chamber.  Viscoelastic was injected into the anterior chamber.  A temporal clear-corneal main wound incision was created using a 2.44mmicrokeratome.  A continuous curvilinear capsulorrhexis was initiated using an irrigating cystitome and completed using capsulorrhexis forceps.  Hydrodissection and hydrodeliniation were performed.  Viscoelastic was injected into the anterior chamber.  A phacoemulsification handpiece and a chopper as a second instrument were used to remove the nucleus and epinucleus. The irrigation/aspiration handpiece was used to remove any remaining  cortical material.   The capsular bag was reinflated with viscoelastic, checked, and found to be intact.  The intraocular lens was inserted into the capsular bag.  The irrigation/aspiration handpiece was used to remove any remaining viscoelastic.  The clear corneal wound and paracentesis wounds were then hydrated and checked with Weck-Cels to be watertight. 0.31m76mf moxifloxacin was injected into the anterior chamber. The lid-speculum was removed.  The drape was removed.  The patient's face was cleaned with a wet and dry 4x4.    A clear shield was taped over the eye. The patient was taken to the post-operative care unit in good condition, having tolerated the procedure well.  Post-Op Instructions: The patient will [facility] at [facility] a same day post-operative evaluation and will receive all other orders and instructions.

## 2022-09-14 NOTE — Discharge Instructions (Signed)
Please discharge patient when stable, will follow up today with Dr. Jacquees Gongora at the [facility] [place] office immediately following discharge.  Leave shield in place until visit.  All paperwork with discharge instructions will be given at the office.  [facility] [place] Address:  [address]  [place], [place] [address]  

## 2022-09-14 NOTE — Anesthesia Postprocedure Evaluation (Signed)
Anesthesia Post Note  Patient: Bradley Black  Procedure(s) Performed: CATARACT EXTRACTION PHACO AND INTRAOCULAR LENS PLACEMENT (IOC) (Left: Eye)  Patient location during evaluation: Short Stay Anesthesia Type: MAC Level of consciousness: awake and alert Pain management: pain level controlled Vital Signs Assessment: post-procedure vital signs reviewed and stable Respiratory status: spontaneous breathing Cardiovascular status: blood pressure returned to baseline and stable Postop Assessment: no apparent nausea or vomiting Anesthetic complications: no   No notable events documented.   Last Vitals:  Vitals:   09/14/22 1027 09/14/22 1126  BP: (!) 165/73 (!) 147/67  Pulse:  (!) 52  Resp:  10  Temp:  36.6 C  SpO2:  100%    Last Pain:  Vitals:   09/14/22 1126  TempSrc: Oral  PainSc: 2                  [name]

## 2022-09-15 DIAGNOSIS — Z992 Dependence on renal dialysis: Secondary | ICD-10-CM | POA: Diagnosis not present

## 2022-09-15 DIAGNOSIS — N186 End stage renal disease: Secondary | ICD-10-CM | POA: Diagnosis not present

## 2022-09-16 DIAGNOSIS — N186 End stage renal disease: Secondary | ICD-10-CM | POA: Diagnosis not present

## 2022-09-16 DIAGNOSIS — Z992 Dependence on renal dialysis: Secondary | ICD-10-CM | POA: Diagnosis not present

## 2022-09-17 DIAGNOSIS — H25811 Combined forms of age-related cataract, right eye: Secondary | ICD-10-CM | POA: Diagnosis not present

## 2022-09-17 DIAGNOSIS — Z992 Dependence on renal dialysis: Secondary | ICD-10-CM | POA: Diagnosis not present

## 2022-09-17 DIAGNOSIS — N186 End stage renal disease: Secondary | ICD-10-CM | POA: Diagnosis not present

## 2022-09-18 DIAGNOSIS — N186 End stage renal disease: Secondary | ICD-10-CM | POA: Diagnosis not present

## 2022-09-18 DIAGNOSIS — Z992 Dependence on renal dialysis: Secondary | ICD-10-CM | POA: Diagnosis not present

## 2022-09-18 NOTE — H&P (Signed)
Surgical History & Physical  Patient Name: Bradley Black DOB: 1945/12/27  Surgery: Cataract extraction with intraocular lens implant phacoemulsification; Right Eye  Surgeon: Baruch Goldmann MD Surgery Date:  09-28-22 Pre-Op Date:  09-17-22  HPI: A 76 Yr. old male patient 1. The patient is returning after cataract post-op. The left eye is affected. Status post cataract post-op, 3 days ago: Since the last visit, the affected area is doing well. The patient's vision is stable and seeing a shadow in temporal corner per pt. Patient is following postop medication instructions. Patient complains of difficulty reading small print on medicine bottles/labels, difficulty reading traffic signs/street signs, and driving at night due to glare from headlights in right eye.. This is negatively affecting the patient's quality of life and the patient is unable to function adequately in life with the current level of vision. HPI was performed by Baruch Goldmann .  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) Diabetes High Blood Pressure Kidney failure(dialysis)  Review of Systems Negative Allergic/Immunologic Negative Cardiovascular Negative Constitutional Negative Ear, Nose, Mouth & Throat Negative Endocrine Negative Eyes Negative Gastrointestinal Negative Genitourinary Negative Hemotologic/Lymphatic Negative Integumentary Negative Musculoskeletal Negative Neurological Negative Psychiatry Negative Respiratory  Social   Former smoker   Medication Prednisolone-Moxifloxacin-Bromfenac,  Azor, Celebrex, Citalopram, Doxazosin, Januvia, Pantoprazole, Pravastatin, Sodium,   Sx/Procedures Phaco c IOL OS,  Heart sx, Back Surgery, Hernia Sx,   Drug Allergies   NKDA  History & Physical: Heent: Cataract, right eye NECK: supple without bruits LUNGS: lungs clear to auscultation CV: regular rate and  rhythm Abdomen: soft and non-tender Impression & Plan: Assessment: 1.  CATARACT EXTRACTION STATUS; Left Eye (Z98.42) 2.  INTRAOCULAR LENS IOL ; Left Eye (Z96.1) 3.  COMBINED FORMS AGE RELATED CATARACT; Right Eye (H25.811)  Plan: 1.  4 days after cataract surgery. Doing well with improved vision and normal eye pressure. Call with any problems or concerns. Continue Pred-Moxi-Brom 3x/day for 3 days, then 2x/day for 3 more weeks.  2.  as above.  3.  Cataract accounts for the patient's decreased vision. This visual impairment is not correctable with a tolerable change in glasses or contact lenses. Cataract surgery with an implantation of a new lens should significantly improve the visual and functional status of the patient. Discussed all risks, benefits, alternatives, and potential complications. Discussed the procedures and recovery. Patient desires to have surgery. A-scan ordered and performed today for intra-ocular lens calculations. The surgery will be performed in order to improve vision for driving, reading, and for eye examinations. Recommend phacoemulsification with intra-ocular lens. Recommend Dextenza for post-operative pain and inflammation. Right Eye. Surgery required to correct imbalance of vision. Dilates poorly - shugarcaine by protocol. Malyugin Ring. Omidira.

## 2022-09-19 ENCOUNTER — Encounter (HOSPITAL_COMMUNITY): Payer: Self-pay | Admitting: Ophthalmology

## 2022-09-19 DIAGNOSIS — N186 End stage renal disease: Secondary | ICD-10-CM | POA: Diagnosis not present

## 2022-09-19 DIAGNOSIS — Z992 Dependence on renal dialysis: Secondary | ICD-10-CM | POA: Diagnosis not present

## 2022-09-20 DIAGNOSIS — Z992 Dependence on renal dialysis: Secondary | ICD-10-CM | POA: Diagnosis not present

## 2022-09-20 DIAGNOSIS — N186 End stage renal disease: Secondary | ICD-10-CM | POA: Diagnosis not present

## 2022-09-21 ENCOUNTER — Encounter (HOSPITAL_COMMUNITY)
Admission: RE | Admit: 2022-09-21 | Discharge: 2022-09-21 | Disposition: A | Discharge status: Home / Self Care | Payer: Medicare Other | Source: Ambulatory Visit | Attending: Ophthalmology | Admitting: Ophthalmology

## 2022-09-21 DIAGNOSIS — Z992 Dependence on renal dialysis: Secondary | ICD-10-CM | POA: Diagnosis not present

## 2022-09-21 DIAGNOSIS — N186 End stage renal disease: Secondary | ICD-10-CM | POA: Diagnosis not present

## 2022-09-22 DIAGNOSIS — Z992 Dependence on renal dialysis: Secondary | ICD-10-CM | POA: Diagnosis not present

## 2022-09-22 DIAGNOSIS — N186 End stage renal disease: Secondary | ICD-10-CM | POA: Diagnosis not present

## 2022-09-23 DIAGNOSIS — Z992 Dependence on renal dialysis: Secondary | ICD-10-CM | POA: Diagnosis not present

## 2022-09-23 DIAGNOSIS — N186 End stage renal disease: Secondary | ICD-10-CM | POA: Diagnosis not present

## 2022-09-24 DIAGNOSIS — N186 End stage renal disease: Secondary | ICD-10-CM | POA: Diagnosis not present

## 2022-09-24 DIAGNOSIS — Z992 Dependence on renal dialysis: Secondary | ICD-10-CM | POA: Diagnosis not present

## 2022-09-25 DIAGNOSIS — Z992 Dependence on renal dialysis: Secondary | ICD-10-CM | POA: Diagnosis not present

## 2022-09-25 DIAGNOSIS — N186 End stage renal disease: Secondary | ICD-10-CM | POA: Diagnosis not present

## 2022-09-26 ENCOUNTER — Encounter (HOSPITAL_COMMUNITY): Payer: Self-pay | Admitting: Ophthalmology

## 2022-09-26 DIAGNOSIS — N186 End stage renal disease: Secondary | ICD-10-CM | POA: Diagnosis not present

## 2022-09-26 DIAGNOSIS — Z992 Dependence on renal dialysis: Secondary | ICD-10-CM | POA: Diagnosis not present

## 2022-09-26 NOTE — Addendum Note (Signed)
Addendum  created 09/26/22 1154 by Ollen Bowl, CRNA   Clinical Note Signed

## 2022-09-26 NOTE — Addendum Note (Signed)
Addendum  created 09/26/22 1153 by Ollen Bowl, CRNA   Intraprocedure Event edited

## 2022-09-26 NOTE — Addendum Note (Signed)
Addendum  created 09/26/22 1155 by Ollen Bowl, CRNA   Intraprocedure Event edited, Intraprocedure Staff edited

## 2022-09-26 NOTE — Addendum Note (Signed)
Addendum  created 09/26/22 1151 by Ollen Bowl, CRNA   Intraprocedure Event deleted, Intraprocedure Event edited

## 2022-09-27 DIAGNOSIS — N186 End stage renal disease: Secondary | ICD-10-CM | POA: Diagnosis not present

## 2022-09-27 DIAGNOSIS — Z992 Dependence on renal dialysis: Secondary | ICD-10-CM | POA: Diagnosis not present

## 2022-09-28 ENCOUNTER — Ambulatory Visit (HOSPITAL_BASED_OUTPATIENT_CLINIC_OR_DEPARTMENT_OTHER): Payer: Medicare Other | Admitting: Anesthesiology

## 2022-09-28 ENCOUNTER — Encounter (HOSPITAL_COMMUNITY)
Admission: RE | Disposition: A | Discharge status: Home / Self Care | Payer: Self-pay | Source: Home / Self Care | Attending: Ophthalmology

## 2022-09-28 ENCOUNTER — Ambulatory Visit (HOSPITAL_COMMUNITY): Payer: Medicare Other | Admitting: Anesthesiology

## 2022-09-28 ENCOUNTER — Ambulatory Visit (HOSPITAL_COMMUNITY)
Admission: RE | Admit: 2022-09-28 | Discharge: 2022-09-28 | Disposition: A | Discharge status: Home / Self Care | Payer: Medicare Other | Attending: Ophthalmology | Admitting: Ophthalmology

## 2022-09-28 ENCOUNTER — Ambulatory Visit: Payer: Medicare Other | Admitting: Cardiology

## 2022-09-28 ENCOUNTER — Encounter (HOSPITAL_COMMUNITY): Payer: Self-pay | Admitting: Ophthalmology

## 2022-09-28 DIAGNOSIS — I252 Old myocardial infarction: Secondary | ICD-10-CM

## 2022-09-28 DIAGNOSIS — I2510 Atherosclerotic heart disease of native coronary artery without angina pectoris: Secondary | ICD-10-CM | POA: Diagnosis not present

## 2022-09-28 DIAGNOSIS — E1122 Type 2 diabetes mellitus with diabetic chronic kidney disease: Secondary | ICD-10-CM | POA: Insufficient documentation

## 2022-09-28 DIAGNOSIS — E1136 Type 2 diabetes mellitus with diabetic cataract: Secondary | ICD-10-CM | POA: Diagnosis not present

## 2022-09-28 DIAGNOSIS — Z955 Presence of coronary angioplasty implant and graft: Secondary | ICD-10-CM | POA: Diagnosis not present

## 2022-09-28 DIAGNOSIS — K219 Gastro-esophageal reflux disease without esophagitis: Secondary | ICD-10-CM | POA: Diagnosis not present

## 2022-09-28 DIAGNOSIS — Z951 Presence of aortocoronary bypass graft: Secondary | ICD-10-CM | POA: Diagnosis not present

## 2022-09-28 DIAGNOSIS — H2511 Age-related nuclear cataract, right eye: Secondary | ICD-10-CM

## 2022-09-28 DIAGNOSIS — Z992 Dependence on renal dialysis: Secondary | ICD-10-CM | POA: Diagnosis not present

## 2022-09-28 DIAGNOSIS — N189 Chronic kidney disease, unspecified: Secondary | ICD-10-CM | POA: Diagnosis not present

## 2022-09-28 DIAGNOSIS — H25811 Combined forms of age-related cataract, right eye: Secondary | ICD-10-CM | POA: Insufficient documentation

## 2022-09-28 DIAGNOSIS — Z87891 Personal history of nicotine dependence: Secondary | ICD-10-CM | POA: Insufficient documentation

## 2022-09-28 DIAGNOSIS — I129 Hypertensive chronic kidney disease with stage 1 through stage 4 chronic kidney disease, or unspecified chronic kidney disease: Secondary | ICD-10-CM | POA: Diagnosis not present

## 2022-09-28 DIAGNOSIS — I10 Essential (primary) hypertension: Secondary | ICD-10-CM | POA: Diagnosis not present

## 2022-09-28 DIAGNOSIS — N186 End stage renal disease: Secondary | ICD-10-CM | POA: Diagnosis not present

## 2022-09-28 HISTORY — PX: CATARACT EXTRACTION W/PHACO: SHX586

## 2022-09-28 SURGERY — PHACOEMULSIFICATION, CATARACT, WITH IOL INSERTION
Anesthesia: Monitor Anesthesia Care | Site: Eye | Laterality: Right

## 2022-09-28 MED ORDER — LIDOCAINE HCL 3.5 % OP GEL: 1.0000 | Freq: Once | OPHTHALMIC | Status: DC

## 2022-09-28 MED ORDER — STERILE WATER FOR IRRIGATION IR SOLN
Status: DC | PRN
  Administered 2022-09-28: 250 mL

## 2022-09-28 MED ORDER — PHENYLEPHRINE HCL 2.5 % OP SOLN
1.0000 [drp] | OPHTHALMIC | Status: DC | PRN
  Administered 2022-09-28 (×2): 1 [drp] via OPHTHALMIC

## 2022-09-28 MED ORDER — EPINEPHRINE PF 1 MG/ML IJ SOLN
INTRAMUSCULAR | Status: AC
  Filled 2022-09-28: qty 2

## 2022-09-28 MED ORDER — TETRACAINE HCL 0.5 % OP SOLN
1.0000 [drp] | OPHTHALMIC | Status: DC | PRN
  Administered 2022-09-28 (×2): 1 [drp] via OPHTHALMIC

## 2022-09-28 MED ORDER — MOXIFLOXACIN HCL 5 MG/ML IO SOLN
INTRAOCULAR | Status: AC
  Filled 2022-09-28: qty 1

## 2022-09-28 MED ORDER — POVIDONE-IODINE 5 % OP SOLN
OPHTHALMIC | Status: DC | PRN
  Administered 2022-09-28: 1 via OPHTHALMIC

## 2022-09-28 MED ORDER — MIDAZOLAM HCL 2 MG/2ML IJ SOLN
INTRAMUSCULAR | Status: AC
  Filled 2022-09-28: qty 2

## 2022-09-28 MED ORDER — SODIUM HYALURONATE 23MG/ML IO SOSY
PREFILLED_SYRINGE | INTRAOCULAR | Status: DC | PRN
  Administered 2022-09-28: .6 mL via INTRAOCULAR

## 2022-09-28 MED ORDER — LIDOCAINE HCL (PF) 1 % IJ SOLN
INTRAOCULAR | Status: DC | PRN
  Administered 2022-09-28: 1 mL via OPHTHALMIC

## 2022-09-28 MED ORDER — PHENYLEPHRINE-KETOROLAC 1-0.3 % IO SOLN
INTRAOCULAR | Status: DC | PRN
  Administered 2022-09-28: 500 mL via OPHTHALMIC

## 2022-09-28 MED ORDER — BSS IO SOLN
INTRAOCULAR | Status: DC | PRN
  Administered 2022-09-28: 15 mL via INTRAOCULAR

## 2022-09-28 MED ORDER — MIDAZOLAM HCL 2 MG/2ML IJ SOLN
INTRAMUSCULAR | Status: DC | PRN
  Administered 2022-09-28: 1 mg via INTRAVENOUS

## 2022-09-28 MED ORDER — TROPICAMIDE 1 % OP SOLN
1.0000 [drp] | OPHTHALMIC | Status: DC | PRN
  Administered 2022-09-28 (×2): 1 [drp] via OPHTHALMIC

## 2022-09-28 MED ORDER — LACTATED RINGERS IV SOLN: INTRAVENOUS | Status: DC

## 2022-09-28 MED ORDER — MOXIFLOXACIN HCL 0.5 % OP SOLN
OPHTHALMIC | Status: DC | PRN
  Administered 2022-09-28: .2 mL via OPHTHALMIC

## 2022-09-28 MED ORDER — PHENYLEPHRINE-KETOROLAC 1-0.3 % IO SOLN
INTRAOCULAR | Status: AC
  Filled 2022-09-28: qty 4

## 2022-09-28 SURGICAL SUPPLY — 14 items
CATARACT SUITE SIGHTPATH (MISCELLANEOUS) ×1 IMPLANT
CLOTH BEACON ORANGE TIMEOUT ST (SAFETY) ×1 IMPLANT
EYE SHIELD UNIVERSAL CLEAR (GAUZE/BANDAGES/DRESSINGS) IMPLANT
FEE CATARACT SUITE SIGHTPATH (MISCELLANEOUS) ×1 IMPLANT
GLOVE BIOGEL PI IND STRL 7.0 (GLOVE) ×2 IMPLANT
LENS IOL RAYNER 22.0 (Intraocular Lens) ×1 IMPLANT
LENS IOL RAYONE EMV 22.0 (Intraocular Lens) IMPLANT
NDL HYPO 18GX1.5 BLUNT FILL (NEEDLE) ×1 IMPLANT
NEEDLE HYPO 18GX1.5 BLUNT FILL (NEEDLE) ×1 IMPLANT
PAD ARMBOARD 7.5X6 YLW CONV (MISCELLANEOUS) ×1 IMPLANT
SYR TB 1ML LL NO SAFETY (SYRINGE) ×1 IMPLANT
TAPE SURG TRANSPORE 1 IN (GAUZE/BANDAGES/DRESSINGS) IMPLANT
TAPE SURGICAL TRANSPORE 1 IN (GAUZE/BANDAGES/DRESSINGS) ×1
WATER STERILE IRR 250ML POUR (IV SOLUTION) ×1 IMPLANT

## 2022-09-28 NOTE — Discharge Instructions (Signed)
Please discharge patient when stable, will follow up today with Dr. Mercy Leppla at the [facility] office immediately following discharge.  Leave shield in place until visit.  All paperwork with discharge instructions will be given at the office.  [facility] Address:  [address]  [place],  [address]  

## 2022-09-28 NOTE — Op Note (Signed)
Date of procedure: 09/28/22  Pre-operative diagnosis:  Visually significant combined form age-related cataract, Right Eye (H25.811)  Post-operative diagnosis:  Visually significant combined form age-related cataract, Right Eye (H25.811)  Procedure: Removal of cataract via phacoemulsification and insertion of intra-ocular lens Rayner [id number] +22.0D into the capsular bag of the Right Eye  Attending surgeon: Gerda Diss. Lariza Cothron, MD, MA  Anesthesia: MAC, Topical Akten  Complications: None  Estimated Blood Loss: <63m (minimal)  Specimens: None  Implants: As above  Indications:  Visually significant age-related cataract, Right Eye  Procedure:  The patient was seen and identified in the pre-operative area. The operative eye was identified and dilated.  The operative eye was marked.  Topical anesthesia was administered to the operative eye.     The patient was then to the operative suite and placed in the supine position.  A timeout was performed confirming the patient, procedure to be performed, and all other relevant information.   The patient's face was prepped and draped in the usual fashion for intra-ocular surgery.  A lid speculum was placed into the operative eye and the surgical microscope moved into place and focused.  A superotemporal paracentesis was created using a 20 gauge paracentesis blade.  Shugarcaine was injected into the anterior chamber.  Viscoelastic was injected into the anterior chamber.  A temporal clear-corneal main wound incision was created using a 2.463mmicrokeratome.  A continuous curvilinear capsulorrhexis was initiated using an irrigating cystitome and completed using capsulorrhexis forceps.  Hydrodissection and hydrodeliniation were performed.  Viscoelastic was injected into the anterior chamber.  A phacoemulsification handpiece and a chopper as a second instrument were used to remove the nucleus and epinucleus. The irrigation/aspiration handpiece was used to remove any  remaining cortical material.   The capsular bag was reinflated with viscoelastic, checked, and found to be intact.  The intraocular lens was inserted into the capsular bag.  The irrigation/aspiration handpiece was used to remove any remaining viscoelastic.  The clear corneal wound and paracentesis wounds were then hydrated and checked with Weck-Cels to be watertight. 0.56m74mf moxifloxacin was injected into the anterior chamber. The lid-speculum was removed.  The drape was removed.  The patient's face was cleaned with a wet and dry 4x4. A clear shield was taped over the eye. The patient was taken to the post-operative care unit in good condition, having tolerated the procedure well.  Post-Op Instructions: [facility] and will receive all other orders and instructions.

## 2022-09-28 NOTE — Anesthesia Postprocedure Evaluation (Signed)
Anesthesia Post Note  Patient: Bradley Black  Procedure(s) Performed: CATARACT EXTRACTION PHACO AND INTRAOCULAR LENS PLACEMENT (IOC) (Right: Eye)  Patient location during evaluation: Phase II Anesthesia Type: MAC Level of consciousness: awake and alert and oriented Pain management: pain level controlled Vital Signs Assessment: post-procedure vital signs reviewed and stable Respiratory status: spontaneous breathing, nonlabored ventilation and respiratory function stable Cardiovascular status: blood pressure returned to baseline and stable Postop Assessment: no apparent nausea or vomiting Anesthetic complications: no  No notable events documented.   Last Vitals:  Vitals:   09/28/22 0919 09/28/22 1021  BP: (!) 160/59 (!) 150/68  Pulse: 83 71  Resp: 12 13  Temp: 36.6 C 36.5 C  SpO2: 99% 99%    Last Pain:  Vitals:   09/28/22 1021  TempSrc: Oral  PainSc: 3                  Bonnie Roig C Dejane Scheibe

## 2022-09-28 NOTE — Interval H&P Note (Signed)
History and Physical Interval Note:  09/28/2022 9:54 AM  Bradley Black  has presented today for surgery, with the diagnosis of nuclear sclerosis age related cataract; right.  The various methods of treatment have been discussed with the patient and family. After consideration of risks, benefits and other options for treatment, the patient has consented to  Procedure(s) with comments: CATARACT EXTRACTION PHACO AND INTRAOCULAR LENS PLACEMENT ([place]) (Right) - CDE as a surgical intervention.  The patient's history has been reviewed, patient examined, no change in status, stable for surgery.  I have reviewed the patient's chart and labs.  Questions were answered to the patient's satisfaction.     Baruch Goldmann

## 2022-09-28 NOTE — Transfer of Care (Signed)
Immediate Anesthesia Transfer of Care Note  Patient: Bradley Black  Procedure(s) Performed: CATARACT EXTRACTION PHACO AND INTRAOCULAR LENS PLACEMENT (IOC) (Right: Eye)  Patient Location: Short Stay  Anesthesia Type:MAC  Level of Consciousness: awake, alert , and oriented  Airway & Oxygen Therapy: Patient Spontanous Breathing  Post-op Assessment: Report given to RN and Post -op Vital signs reviewed and stable  Post vital signs: Reviewed and stable  Last Vitals:  Vitals Value Taken Time  BP 150/68 09/28/22 1021  Temp 36.5 C 09/28/22 1021  Pulse 71 09/28/22 1021  Resp 13 09/28/22 1021  SpO2 99 % 09/28/22 1021    Last Pain:  Vitals:   09/28/22 1021  TempSrc: Oral  PainSc:       Patients Stated Pain Goal: 5 (85/46/27 0350)  Complications: No notable events documented.

## 2022-09-28 NOTE — Anesthesia Preprocedure Evaluation (Signed)
Anesthesia Evaluation  Patient identified by MRN, date of birth, ID band Patient awake    Reviewed: Allergy & Precautions, NPO status , Patient's Chart, lab work & pertinent test results  Airway Mallampati: II  TM Distance: >3 FB Neck ROM: Full    Dental  (+) Dental Advisory Given, Teeth Intact   Pulmonary former smoker   Pulmonary exam normal breath sounds clear to auscultation       Cardiovascular Exercise Tolerance: Good hypertension, Pt. on medications + CAD, + Past MI, + Cardiac Stents and + CABG  Normal cardiovascular exam+ Valvular Problems/Murmurs  Rhythm:Regular Rate:Normal  Erma Heritage, PA-C 11/17/2020  5:09 PM EST   Please let the patient know the pumping function of his heart has improved! His EF was at 25% by echo in 07/2020, now at 35-40% (normal is 55-60% but his was at 45% in 10/2018 and 11/2015). Given the improvement in his pumping function along with no reported anginal symptoms at the time of his office visit, he is cleared to proceed with peritoneal dialysis catheter placement. I reviewed with this with Dr. Domenic Polite (DOD) as well who is in agreement. I will fax his cardiac clearance form to [facility].     Neuro/Psych    GI/Hepatic ,GERD  Medicated and Controlled,,  Endo/Other  diabetes, Well Controlled, Type 2, Oral Hypoglycemic Agents    Renal/GU Renal Insufficiency and DialysisRenal disease     Musculoskeletal   Abdominal   Peds  Hematology  (+) Blood dyscrasia, anemia   Anesthesia Other Findings   Reproductive/Obstetrics                             Anesthesia Physical Anesthesia Plan  ASA: 3  Anesthesia Plan: MAC   Post-op Pain Management: Minimal or no pain anticipated   Induction:   PONV Risk Score and Plan: 0 and Treatment may vary due to age or medical condition  Airway Management Planned: Nasal Cannula and Natural Airway  Additional Equipment:    Intra-op Plan:   Post-operative Plan:   Informed Consent: I have reviewed the patients History and Physical, chart, labs and discussed the procedure including the risks, benefits and alternatives for the proposed anesthesia with the patient or authorized representative who has indicated his/her understanding and acceptance.     Dental advisory given  Plan Discussed with: CRNA and Surgeon  Anesthesia Plan Comments:         Anesthesia Quick Evaluation

## 2022-09-29 DIAGNOSIS — Z992 Dependence on renal dialysis: Secondary | ICD-10-CM | POA: Diagnosis not present

## 2022-09-29 DIAGNOSIS — N186 End stage renal disease: Secondary | ICD-10-CM | POA: Diagnosis not present

## 2022-09-30 DIAGNOSIS — N186 End stage renal disease: Secondary | ICD-10-CM | POA: Diagnosis not present

## 2022-09-30 DIAGNOSIS — Z992 Dependence on renal dialysis: Secondary | ICD-10-CM | POA: Diagnosis not present

## 2022-10-01 DIAGNOSIS — Z992 Dependence on renal dialysis: Secondary | ICD-10-CM | POA: Diagnosis not present

## 2022-10-01 DIAGNOSIS — N186 End stage renal disease: Secondary | ICD-10-CM | POA: Diagnosis not present

## 2022-10-02 ENCOUNTER — Telehealth: Payer: Self-pay

## 2022-10-02 DIAGNOSIS — Z992 Dependence on renal dialysis: Secondary | ICD-10-CM | POA: Diagnosis not present

## 2022-10-02 DIAGNOSIS — N186 End stage renal disease: Secondary | ICD-10-CM | POA: Diagnosis not present

## 2022-10-02 NOTE — Telephone Encounter (Signed)
Pt of Dr. Dennard Schaumann. Pt c/o issues with prostate infection again, states he is having the same sx as before. Pt was seen by Dr. Dennard Schaumann in September and was given Cipro. Pt asks if another rx for Cipro could be sent in to treat?

## 2022-10-03 ENCOUNTER — Ambulatory Visit (INDEPENDENT_AMBULATORY_CARE_PROVIDER_SITE_OTHER): Payer: Medicare Other | Admitting: Family Medicine

## 2022-10-03 ENCOUNTER — Encounter: Payer: Self-pay | Admitting: Family Medicine

## 2022-10-03 VITALS — BP 160/80 | HR 78 | Temp 97.4°F | Ht 68.0 in | Wt 147.0 lb

## 2022-10-03 DIAGNOSIS — Z992 Dependence on renal dialysis: Secondary | ICD-10-CM | POA: Diagnosis not present

## 2022-10-03 DIAGNOSIS — R300 Dysuria: Secondary | ICD-10-CM

## 2022-10-03 DIAGNOSIS — N186 End stage renal disease: Secondary | ICD-10-CM | POA: Diagnosis not present

## 2022-10-03 DIAGNOSIS — N410 Acute prostatitis: Secondary | ICD-10-CM | POA: Diagnosis not present

## 2022-10-03 LAB — URINALYSIS, ROUTINE W REFLEX MICROSCOPIC
Bacteria, UA: NONE SEEN /HPF
Bilirubin Urine: NEGATIVE
Glucose, UA: NEGATIVE
Hgb urine dipstick: NEGATIVE
Hyaline Cast: NONE SEEN /LPF
Ketones, ur: NEGATIVE
Leukocytes,Ua: NEGATIVE
Nitrite: NEGATIVE
RBC / HPF: NONE SEEN /HPF (ref 0–2)
Specific Gravity, Urine: 1.02 (ref 1.001–1.035)
Squamous Epithelial / HPF: NONE SEEN /HPF (ref <=5)
WBC, UA: NONE SEEN /HPF (ref 0–5)
pH: 5.5 (ref 5.0–8.0)

## 2022-10-03 LAB — MICROSCOPIC MESSAGE

## 2022-10-03 MED ORDER — CIPROFLOXACIN HCL 500 MG PO TABS: 500.0000 mg | ORAL_TABLET | Freq: Every day | ORAL | 0 refills | Status: AC

## 2022-10-03 NOTE — Assessment & Plan Note (Signed)
UA negative in office today. Symptoms are consistent with prostatitis, his prostate is enlarged and tender with associated suprapubic tenderness, dysuria, frequency, and hesitancy. Symptoms are consistent with his presentation in September and he reported resolution with antibiotics so I will treat again with Cipro '500mg'$  daily since he is on dialysis for 10 days. Instructed to return to office if symptoms persist or worsen.

## 2022-10-03 NOTE — Progress Notes (Signed)
Acute Office Visit  Subjective:     Patient ID: Bradley Black, male    DOB: 1946-02-28, 77 y.o.   MRN: [id number]  Chief Complaint  Patient presents with   Acute Visit    Prostate infection vs bladder infection. Per pt could only use bathroom a little bit and each time it hurts, started this weekend. Would like an abx    HPI Patient is in today for 4 days dysuria, frequency, dribbling, and lower abdominal pressure. He denies fever, chills, or night sweats, N/V/D. He does perform peritoneal dialysis at home but continues to void. These symptoms are similar to a recent episode of prostatitis that was relieved with antibiotics in September 2023.  Review of Systems  All other systems reviewed and are negative.   Past Medical History:  Diagnosis Date   BPH (benign prostatic hyperplasia)    CAD S/P percutaneous coronary angioplasty    a. PTCA of [place] b. PCI with BMS to LAD in 1997 c. RCA PCI [place] d. s/p CABG in 11/2011 with LIMA-LAD, SVG-PDA, SVG-OM2, and SVG-D1   Cataract    Chronic back pain    CKD (chronic kidney disease), stage III (HCC)    Cyst of bursa    R shoulder   Diabetic retinopathy ([place])    DM (diabetes mellitus), type 2 with renal complications (HCC)    Essential hypertension    Frequent PVCs    GERD (gastroesophageal reflux disease)    Hypertensive retinopathy    Ischemic cardiomyopathy 11/2011   Intra-OP TEE: EF 40-45%, no regional WMA; improved Anterior WM post CABG.   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   LIMA to LAD, SVG to D1, SVG to OM2, SVG to PDA, EVH via right thigh and leg   Past Surgical History:  Procedure Laterality Date   AV FISTULA PLACEMENT Left 07/28/2020   Procedure: LEFT ARM ARTERIOVENOUS (AV) FISTULA  CREATION;  Surgeon: Rosetta Posner, MD;  Location: [facility];  Service: Vascular;  Laterality: Left;   BACK  SURGERY  10/01/1968   BIOPSY  01/25/2022   Procedure: BIOPSY;  Surgeon: Daneil Dolin, MD;  Location: [facility];  Service: Endoscopy;;   CARDIAC CATHETERIZATION  10/02/2011   CATARACT EXTRACTION W/PHACO Left 09/14/2022   Procedure: CATARACT EXTRACTION PHACO AND INTRAOCULAR LENS PLACEMENT ([place]);  Surgeon: Baruch Goldmann, MD;  Location: [facility];  Service: Ophthalmology;  Laterality: Left;  CDE: 8.66   COLONOSCOPY WITH PROPOFOL N/A 01/25/2022   Procedure: COLONOSCOPY WITH PROPOFOL;  Surgeon: Daneil Dolin, MD;  Location: [facility];  Service: Endoscopy;  Laterality: N/A;  10:30am   CORONARY ANGIOPLASTY  10/01/1992   OM   CORONARY ANGIOPLASTY  10/02/1995   LAD   CORONARY ANGIOPLASTY WITH STENT PLACEMENT  10/01/1997   RCA   CORONARY ANGIOPLASTY WITH STENT PLACEMENT  10/02/1999   RCA   CORONARY ARTERY BYPASS GRAFT  12/27/2011   Procedure: CORONARY ARTERY BYPASS GRAFTING (CABG);  Surgeon: Rexene Alberts, MD;  Location: [facility];  Service: Open Heart Surgery;  Laterality: N/A;  Times four. On pump. Using endoscopically harvested right greater saphenous vein and left internal mammary artery.    ESOPHAGOGASTRODUODENOSCOPY (EGD) WITH PROPOFOL N/A 01/25/2022   Procedure: ESOPHAGOGASTRODUODENOSCOPY (EGD) WITH PROPOFOL;  Surgeon: Daneil Dolin, MD;  [facility];  Service: Endoscopy;  Laterality: N/A;   HERNIA REPAIR Bilateral    INCISION AND DRAINAGE OF WOUND  10/01/2004   [place]   INSERTION OF DIALYSIS CATHETER Right 07/25/2020   Procedure: INSERTION OF DIALYSIS CATHETER;  Surgeon: Virl Cagey, MD;  Location: [facility];  Service: General;  Laterality: Right;   INTRAOPERATIVE TRANSESOPHAGEAL ECHOCARDIOGRAM  12/27/2011   Global hypokinesis with EF of 40-45%, improved LAD distribution wall motion.   LEFT HEART CATHETERIZATION WITH CORONARY ANGIOGRAM N/A 12/13/2011   Procedure: LEFT HEART CATHETERIZATION WITH CORONARY ANGIOGRAM;  Surgeon: Leonie Man, MD;  Location: [facility] CATH  LAB;  Service: Cardiovascular;  Laterality: N/A;   LUMBAR FUSION     MASS EXCISION  10/24/2011   R arm   POLYPECTOMY  01/25/2022   Procedure: POLYPECTOMY;  Surgeon: Daneil Dolin, MD;  [facility];  Service: Endoscopy;;   RIGHT HEART CATH N/A 07/12/2020   Procedure: RIGHT HEART CATH;  Surgeon: Belva Crome, MD;  Location: [facility] CV LAB;  Service: Cardiovascular;  Laterality: N/A;   TRANSESOPHAGEAL ECHOCARDIOGRAM  10/02/2011   Current Outpatient Medications on File Prior to Visit  Medication Sig Dispense Refill   aspirin EC 81 MG tablet Take 81 mg by mouth daily.     brimonidine (ALPHAGAN) 0.2 % ophthalmic solution Place 1 drop into both eyes 2 (two) times daily. 10 mL 3   Cholecalciferol (VITAMIN D3) 25 MCG (1000 UT) CAPS Take 1 capsule (1,000 Units total) by mouth daily at 6 (six) AM. 60 capsule 6   citalopram (CELEXA) 10 MG tablet Take 10 mg by mouth daily.     desvenlafaxine (PRISTIQ) 25 MG 24 hr tablet TAKE ONE TABLET BY MOUTH DAILY. 90 tablet 0   diltiazem (CARDIZEM CD) 120 MG 24 hr capsule TAKE ONE CAPSULE ('120MG'$  TOTAL) BY MOUTH DAILY 30 capsule 1   doxazosin (CARDURA) 8 MG tablet Take 8 mg by mouth daily.     ENTRESTO 97-103 MG TAKE ONE TABLET BY MOUTH TWICE A DAY 60 tablet 6   furosemide (LASIX) 20 MG tablet Take 20 mg by mouth daily.     hydrALAZINE (APRESOLINE) 50 MG tablet Take 50 mg by mouth 3 (three) times daily.     levothyroxine (SYNTHROID) 50 MCG tablet Take 1 tablet (50 mcg total) by mouth daily before breakfast. 90 tablet 3   multivitamin (RENA-VIT) TABS tablet Take 1 tablet by mouth daily.     pantoprazole (PROTONIX) 40 MG tablet TAKE ONE TABLET BY MOUTH TWICE A DAY 180 tablet 3   pravastatin (PRAVACHOL) 20 MG tablet TAKE ONE TABLET ('20MG'$  TOTAL) BY MOUTH DAILY 90 tablet 3   sildenafil (VIAGRA) 100 MG tablet Take 0.5-1 tablets (50-100 mg total) by mouth daily as needed for erectile dysfunction. 60 tablet 1   torsemide (DEMADEX) 20 MG tablet Take 20 mg  by mouth daily as needed (swelling).     No current facility-administered medications on file prior to visit.   No Known Allergies      Objective:    BP (!) 160/80   Pulse 78   Temp (!) 97.4 F (36.3 C) (Oral)   Ht '5\' 8"'$  (1.727 m)   Wt 147 lb (66.7 kg)   SpO2 98%   BMI 22.35 kg/m    Physical Exam Vitals and nursing note reviewed.  Constitutional:      Appearance: Normal appearance. He is normal weight. He is ill-appearing.  HENT:     Head: Normocephalic and atraumatic.  Abdominal:  General: Abdomen is flat. Bowel sounds are normal. There is no distension.     Palpations: Abdomen is soft.     Tenderness: There is no abdominal tenderness. There is no guarding or rebound.  Genitourinary:    Prostate: Enlarged and tender.     Rectum: Normal.  Skin:    General: Skin is warm and dry.     Capillary Refill: Capillary refill takes less than 2 seconds.  Neurological:     General: No focal deficit present.     Mental Status: He is alert and oriented to person, place, and time. Mental status is at baseline.  Psychiatric:        Mood and Affect: Mood normal.        Behavior: Behavior normal.        Thought Content: Thought content normal.        Judgment: Judgment normal.     No results found for any visits on 10/03/22.      Assessment & Plan:   Problem List Items Addressed This Visit       Genitourinary   ESRD (end stage renal disease) ([place])   Acute prostatitis    UA negative in office today. Symptoms are consistent with prostatitis, his prostate is enlarged and tender with associated suprapubic tenderness, dysuria, frequency, and hesitancy. Symptoms are consistent with his presentation in September and he reported resolution with antibiotics so I will treat again with Cipro '500mg'$  daily since he is on dialysis for 10 days. Instructed to return to office if symptoms persist or worsen.      Other Visit Diagnoses     Dysuria    -  Primary   Relevant Orders    Urinalysis, Routine w reflex microscopic       Meds ordered this encounter  Medications   ciprofloxacin (CIPRO) 500 MG tablet    Sig: Take 1 tablet (500 mg total) by mouth daily for 10 days.    Dispense:  10 tablet    Refill:  0    Order Specific Question:   Supervising Provider    Answer:   Jenna Luo T [[id number]]    No follow-ups on file.  Rubie Maid, FNP

## 2022-10-04 DIAGNOSIS — N186 End stage renal disease: Secondary | ICD-10-CM | POA: Diagnosis not present

## 2022-10-04 DIAGNOSIS — Z992 Dependence on renal dialysis: Secondary | ICD-10-CM | POA: Diagnosis not present

## 2022-10-05 ENCOUNTER — Encounter (HOSPITAL_COMMUNITY): Payer: Self-pay | Admitting: Ophthalmology

## 2022-10-05 DIAGNOSIS — Z992 Dependence on renal dialysis: Secondary | ICD-10-CM | POA: Diagnosis not present

## 2022-10-05 DIAGNOSIS — N186 End stage renal disease: Secondary | ICD-10-CM | POA: Diagnosis not present

## 2022-10-06 DIAGNOSIS — N186 End stage renal disease: Secondary | ICD-10-CM | POA: Diagnosis not present

## 2022-10-06 DIAGNOSIS — Z992 Dependence on renal dialysis: Secondary | ICD-10-CM | POA: Diagnosis not present

## 2022-10-07 DIAGNOSIS — N186 End stage renal disease: Secondary | ICD-10-CM | POA: Diagnosis not present

## 2022-10-07 DIAGNOSIS — Z992 Dependence on renal dialysis: Secondary | ICD-10-CM | POA: Diagnosis not present

## 2022-10-08 DIAGNOSIS — N186 End stage renal disease: Secondary | ICD-10-CM | POA: Diagnosis not present

## 2022-10-08 DIAGNOSIS — Z992 Dependence on renal dialysis: Secondary | ICD-10-CM | POA: Diagnosis not present

## 2022-10-09 DIAGNOSIS — N186 End stage renal disease: Secondary | ICD-10-CM | POA: Diagnosis not present

## 2022-10-09 DIAGNOSIS — Z992 Dependence on renal dialysis: Secondary | ICD-10-CM | POA: Diagnosis not present

## 2022-10-10 DIAGNOSIS — N186 End stage renal disease: Secondary | ICD-10-CM | POA: Diagnosis not present

## 2022-10-10 DIAGNOSIS — Z992 Dependence on renal dialysis: Secondary | ICD-10-CM | POA: Diagnosis not present

## 2022-10-11 DIAGNOSIS — N186 End stage renal disease: Secondary | ICD-10-CM | POA: Diagnosis not present

## 2022-10-11 DIAGNOSIS — Z992 Dependence on renal dialysis: Secondary | ICD-10-CM | POA: Diagnosis not present

## 2022-10-12 DIAGNOSIS — Z992 Dependence on renal dialysis: Secondary | ICD-10-CM | POA: Diagnosis not present

## 2022-10-12 DIAGNOSIS — N186 End stage renal disease: Secondary | ICD-10-CM | POA: Diagnosis not present

## 2022-10-13 DIAGNOSIS — Z992 Dependence on renal dialysis: Secondary | ICD-10-CM | POA: Diagnosis not present

## 2022-10-13 DIAGNOSIS — N186 End stage renal disease: Secondary | ICD-10-CM | POA: Diagnosis not present

## 2022-10-14 DIAGNOSIS — N186 End stage renal disease: Secondary | ICD-10-CM | POA: Diagnosis not present

## 2022-10-14 DIAGNOSIS — Z992 Dependence on renal dialysis: Secondary | ICD-10-CM | POA: Diagnosis not present

## 2022-10-15 ENCOUNTER — Other Ambulatory Visit: Payer: Self-pay | Admitting: Family Medicine

## 2022-10-15 DIAGNOSIS — Z992 Dependence on renal dialysis: Secondary | ICD-10-CM | POA: Diagnosis not present

## 2022-10-15 DIAGNOSIS — I6523 Occlusion and stenosis of bilateral carotid arteries: Secondary | ICD-10-CM

## 2022-10-15 DIAGNOSIS — N186 End stage renal disease: Secondary | ICD-10-CM | POA: Diagnosis not present

## 2022-10-16 DIAGNOSIS — N186 End stage renal disease: Secondary | ICD-10-CM | POA: Diagnosis not present

## 2022-10-16 DIAGNOSIS — Z992 Dependence on renal dialysis: Secondary | ICD-10-CM | POA: Diagnosis not present

## 2022-10-17 DIAGNOSIS — Z992 Dependence on renal dialysis: Secondary | ICD-10-CM | POA: Diagnosis not present

## 2022-10-17 DIAGNOSIS — N186 End stage renal disease: Secondary | ICD-10-CM | POA: Diagnosis not present

## 2022-10-18 DIAGNOSIS — Z992 Dependence on renal dialysis: Secondary | ICD-10-CM | POA: Diagnosis not present

## 2022-10-18 DIAGNOSIS — N186 End stage renal disease: Secondary | ICD-10-CM | POA: Diagnosis not present

## 2022-10-19 DIAGNOSIS — Z992 Dependence on renal dialysis: Secondary | ICD-10-CM | POA: Diagnosis not present

## 2022-10-19 DIAGNOSIS — N186 End stage renal disease: Secondary | ICD-10-CM | POA: Diagnosis not present

## 2022-10-20 DIAGNOSIS — Z992 Dependence on renal dialysis: Secondary | ICD-10-CM | POA: Diagnosis not present

## 2022-10-20 DIAGNOSIS — N186 End stage renal disease: Secondary | ICD-10-CM | POA: Diagnosis not present

## 2022-10-21 DIAGNOSIS — N186 End stage renal disease: Secondary | ICD-10-CM | POA: Diagnosis not present

## 2022-10-21 DIAGNOSIS — Z992 Dependence on renal dialysis: Secondary | ICD-10-CM | POA: Diagnosis not present

## 2022-10-22 DIAGNOSIS — N186 End stage renal disease: Secondary | ICD-10-CM | POA: Diagnosis not present

## 2022-10-22 DIAGNOSIS — Z992 Dependence on renal dialysis: Secondary | ICD-10-CM | POA: Diagnosis not present

## 2022-10-23 DIAGNOSIS — N186 End stage renal disease: Secondary | ICD-10-CM | POA: Diagnosis not present

## 2022-10-23 DIAGNOSIS — Z992 Dependence on renal dialysis: Secondary | ICD-10-CM | POA: Diagnosis not present

## 2022-10-24 DIAGNOSIS — Z992 Dependence on renal dialysis: Secondary | ICD-10-CM | POA: Diagnosis not present

## 2022-10-24 DIAGNOSIS — N186 End stage renal disease: Secondary | ICD-10-CM | POA: Diagnosis not present

## 2022-10-25 DIAGNOSIS — Z992 Dependence on renal dialysis: Secondary | ICD-10-CM | POA: Diagnosis not present

## 2022-10-25 DIAGNOSIS — N186 End stage renal disease: Secondary | ICD-10-CM | POA: Diagnosis not present

## 2022-10-26 DIAGNOSIS — Z992 Dependence on renal dialysis: Secondary | ICD-10-CM | POA: Diagnosis not present

## 2022-10-26 DIAGNOSIS — N186 End stage renal disease: Secondary | ICD-10-CM | POA: Diagnosis not present

## 2022-10-27 DIAGNOSIS — N186 End stage renal disease: Secondary | ICD-10-CM | POA: Diagnosis not present

## 2022-10-27 DIAGNOSIS — Z992 Dependence on renal dialysis: Secondary | ICD-10-CM | POA: Diagnosis not present

## 2022-10-28 DIAGNOSIS — N186 End stage renal disease: Secondary | ICD-10-CM | POA: Diagnosis not present

## 2022-10-28 DIAGNOSIS — Z992 Dependence on renal dialysis: Secondary | ICD-10-CM | POA: Diagnosis not present

## 2022-10-29 DIAGNOSIS — N186 End stage renal disease: Secondary | ICD-10-CM | POA: Diagnosis not present

## 2022-10-29 DIAGNOSIS — Z992 Dependence on renal dialysis: Secondary | ICD-10-CM | POA: Diagnosis not present

## 2022-10-30 DIAGNOSIS — N186 End stage renal disease: Secondary | ICD-10-CM | POA: Diagnosis not present

## 2022-10-30 DIAGNOSIS — Z992 Dependence on renal dialysis: Secondary | ICD-10-CM | POA: Diagnosis not present

## 2022-10-31 DIAGNOSIS — Z992 Dependence on renal dialysis: Secondary | ICD-10-CM | POA: Diagnosis not present

## 2022-10-31 DIAGNOSIS — N186 End stage renal disease: Secondary | ICD-10-CM | POA: Diagnosis not present

## 2022-11-01 DIAGNOSIS — N186 End stage renal disease: Secondary | ICD-10-CM | POA: Diagnosis not present

## 2022-11-01 DIAGNOSIS — Z992 Dependence on renal dialysis: Secondary | ICD-10-CM | POA: Diagnosis not present

## 2022-11-02 DIAGNOSIS — Z992 Dependence on renal dialysis: Secondary | ICD-10-CM | POA: Diagnosis not present

## 2022-11-02 DIAGNOSIS — N186 End stage renal disease: Secondary | ICD-10-CM | POA: Diagnosis not present

## 2022-11-03 DIAGNOSIS — Z992 Dependence on renal dialysis: Secondary | ICD-10-CM | POA: Diagnosis not present

## 2022-11-03 DIAGNOSIS — N186 End stage renal disease: Secondary | ICD-10-CM | POA: Diagnosis not present

## 2022-11-04 DIAGNOSIS — N186 End stage renal disease: Secondary | ICD-10-CM | POA: Diagnosis not present

## 2022-11-04 DIAGNOSIS — Z992 Dependence on renal dialysis: Secondary | ICD-10-CM | POA: Diagnosis not present

## 2022-11-05 DIAGNOSIS — N186 End stage renal disease: Secondary | ICD-10-CM | POA: Diagnosis not present

## 2022-11-05 DIAGNOSIS — Z992 Dependence on renal dialysis: Secondary | ICD-10-CM | POA: Diagnosis not present

## 2022-11-06 DIAGNOSIS — N186 End stage renal disease: Secondary | ICD-10-CM | POA: Diagnosis not present

## 2022-11-06 DIAGNOSIS — Z992 Dependence on renal dialysis: Secondary | ICD-10-CM | POA: Diagnosis not present

## 2022-11-07 DIAGNOSIS — N186 End stage renal disease: Secondary | ICD-10-CM | POA: Diagnosis not present

## 2022-11-07 DIAGNOSIS — Z992 Dependence on renal dialysis: Secondary | ICD-10-CM | POA: Diagnosis not present

## 2022-11-08 ENCOUNTER — Other Ambulatory Visit: Payer: Self-pay

## 2022-11-08 DIAGNOSIS — Z992 Dependence on renal dialysis: Secondary | ICD-10-CM | POA: Diagnosis not present

## 2022-11-08 DIAGNOSIS — Z79899 Other long term (current) drug therapy: Secondary | ICD-10-CM

## 2022-11-08 DIAGNOSIS — N186 End stage renal disease: Secondary | ICD-10-CM | POA: Diagnosis not present

## 2022-11-09 ENCOUNTER — Telehealth: Payer: Self-pay | Admitting: Pharmacist

## 2022-11-09 DIAGNOSIS — N186 End stage renal disease: Secondary | ICD-10-CM | POA: Diagnosis not present

## 2022-11-09 DIAGNOSIS — Z992 Dependence on renal dialysis: Secondary | ICD-10-CM | POA: Diagnosis not present

## 2022-11-09 NOTE — Progress Notes (Signed)
Care Management & Coordination Services Pharmacy Team  Reason for Encounter: Appointment Reminder  Contacted patient to confirm in office appointment with Leata Mouse, PharmD on 11/12/2022 at 79 am. Spoke with patient on 11/09/2022   Do you have any problems getting your medications? Yes If yes what types of problems are you experiencing? Financial barriers with [facility]  What is your top health concern you would like to discuss at your upcoming visit? Blood pressure  Have you seen any other providers since your last visit with PCP? No   Chart review:  Recent office visits:  10/03/2022 OV (Fam Med) Rubie Maid, FNP; I will treat again with Cipro 5105m daily since he is on dialysis for 10 days   06/12/2022 OV (PCP) PSusy Frizzle MD; He has tried and failed 1 week of Keflex. I will switch to Cipro. I will start the patient on 500 mg once daily. Initially began with 10 days.   Recent consult visits:  08/22/2022 OV (Pain Med) LGillis Santa MD; no medication changes noted.  07/03/2022 OV (Pain Med) LGillis Santa MD; no medication changes noted.  05/14/2022 OV (Retina Spec) ZBernarda Caffey MD; - will start brimonidine BID [facility]  09/28/2022 Admission for Surgery - CATARACT EXTRACTION PHACO AND INTRAOCULAR LENS PLACEMENT   05/28/2022 ED visit for COVID-19 -molnupiravir 200 mg two times daily  Star Rating Drugs:  Pravastatin 20 mg last filled 08/13/2022 90 DS Entresto 97-103 mg last filled 10/02/2022 30 DS   Care Gaps: Annual wellness visit in last year? Yes  If Diabetic: Last eye exam / retinopathy screening: 11/13/2021 Last diabetic foot exam: 05/01/2019   Future Appointments  Date Time Provider [place] 11/12/2022 10:00 AM DEdythe Clarity [place]  12/06/2022  2:00 PM BSFM-NURSE HEALTH ADVISOR [facility] 12/10/2022  9:05 AM MHayden Pedro MD TRE-TRE None  01/24/2023 11:20 AM BArnoldo Lenis MD [facility]  05/13/2023   9:30 AM ZBernarda Caffey MD TRE-TRE None  08/19/2023 11:30 AM MC-CV [place] [place] CV-EDEN None   April D Calhoun, CRogersPharmacist Assistant [phone]

## 2022-11-10 DIAGNOSIS — Z992 Dependence on renal dialysis: Secondary | ICD-10-CM | POA: Diagnosis not present

## 2022-11-10 DIAGNOSIS — N186 End stage renal disease: Secondary | ICD-10-CM | POA: Diagnosis not present

## 2022-11-11 DIAGNOSIS — Z992 Dependence on renal dialysis: Secondary | ICD-10-CM | POA: Diagnosis not present

## 2022-11-11 DIAGNOSIS — N186 End stage renal disease: Secondary | ICD-10-CM | POA: Diagnosis not present

## 2022-11-12 ENCOUNTER — Ambulatory Visit: Payer: Medicare Other | Admitting: Pharmacist

## 2022-11-12 DIAGNOSIS — N186 End stage renal disease: Secondary | ICD-10-CM | POA: Diagnosis not present

## 2022-11-12 DIAGNOSIS — Z992 Dependence on renal dialysis: Secondary | ICD-10-CM | POA: Diagnosis not present

## 2022-11-12 NOTE — Progress Notes (Signed)
Care Management & Coordination Services Pharmacy Note  11/12/2022 Name:  Bradley Black MRN:  [id number] DOB:  12-27-1945  Summary: Initial visit with PharmD.  Main concerns today are BP and increased Entresto copay.  BP ranges from 99991111 systolic at home.  Was previously on diltiazem but stopped due to possible fatigue.  Discussed possibility of resuming and he was agreeable.  Also started assistance for Entresto today to avoid lapse in care  that happened last year due to high copay.  Recommendations/Changes made from today's visit: PAP Entresto Restart Diltiazem CD 120 for elevated BP  Follow up plan: CMA to assess BP in 2 weeks   Subjective: Bradley Black is an 77 y.o. year old male who is a primary patient of Pickard, [name], MD.  The care coordination team was consulted for assistance with disease management and care coordination needs.    Engaged with patient face to face for initial visit.  Recent office visits:  10/03/2022 OV (Fam Med) Mila Merry S, FNP; I will treat again with Cipro 567m daily since he is on dialysis for 10 days    06/12/2022 OV (PCP) PSusy Frizzle MD; He has tried and failed 1 week of Keflex. I will switch to Cipro. I will start the patient on 500 mg once daily. Initially began with 10 days.    Recent consult visits:  08/22/2022 OV (Pain Med) LGillis Santa MD; no medication changes noted.   07/03/2022 OV (Pain Med) LGillis Santa MD; no medication changes noted.   05/14/2022 OV (Retina Spec) ZBernarda Caffey MD; - will start brimonidine BID [facility]  [facility]    05/28/2022 ED visit for COVID-19 -molnupiravir 200 mg two times daily   Objective:  Lab Results  Component Value Date   CREATININE 3.51 (H) 05/28/2022   BUN 53 (H) 05/28/2022   EGFR 15 (L) 02/15/2022   GFRNONAA 17 (L) 05/28/2022   GFRAA 24 (L) 05/01/2019   NA 135 05/28/2022    K 4.6 05/28/2022   CALCIUM 8.8 (L) 05/28/2022   CO2 19 (L) 05/28/2022   GLUCOSE 131 (H) 05/28/2022    Lab Results  Component Value Date/Time   HGBA1C 5.7 (H) 07/08/2020 01:29 PM   HGBA1C 6.0 (H) 05/01/2019 09:45 AM   MICROALBUR 104.0 05/01/2019 09:45 AM   MICROALBUR 50.8 01/27/2018 09:08 AM    Last diabetic Eye exam:  Lab Results  Component Value Date/Time   HMDIABEYEEXA Retinopathy (A) 11/13/2021 08:59 AM    Last diabetic Foot exam: No results found for: "HMDIABFOOTEX"   Lab Results  Component Value Date   CHOL 104 07/13/2020   HDL 25 (L) 07/13/2020   LDLCALC 67 07/13/2020   TRIG 62 07/13/2020   CHOLHDL 4.2 07/13/2020       Latest Ref Rng & Units 05/28/2022   12:07 PM 02/15/2022   12:25 PM 12/03/2020    1:20 PM  Hepatic Function  Total Protein 6.5 - 8.1 g/dL 6.7  6.2  6.9   Albumin 3.5 - 5.0 g/dL 3.3   3.4   AST 15 - 41 U/L 28  17  22   $ ALT 0 - 44 U/L 31  18  22   $ Alk Phosphatase 38 - 126 U/L 93   83   Total Bilirubin 0.3 - 1.2 mg/dL 0.9  0.8  1.0     Lab Results  Component Value Date/Time   TSH 2.08  02/15/2022 12:25 PM   TSH 3.07 09/29/2021 02:18 PM   FREET4 1.04 08/04/2021 01:00 PM   FREET4 1.18 (H) 04/20/2021 12:45 PM       Latest Ref Rng & Units 05/28/2022   12:07 PM 02/15/2022   12:25 PM 01/25/2022    9:51 AM  CBC  WBC 4.0 - 10.5 K/uL 5.5  6.7    Hemoglobin 13.0 - 17.0 g/dL 12.4  11.6  13.3   Hematocrit 39.0 - 52.0 % 37.2  35.1  39.0   Platelets 150 - 400 K/uL 135  147      Lab Results  Component Value Date/Time   VD25OH 23.20 (L) 04/20/2021 12:45 PM   VD25OH 39.14 07/23/2020 05:33 AM   VITAMINB12 445 02/15/2022 12:25 PM   VITAMINB12 486 04/20/2021 12:45 PM    Clinical ASCVD: Yes  The ASCVD Risk score (Arnett DK, et al., 2019) failed to calculate for the following reasons:   The patient has a prior MI or stroke diagnosis        07/23/2022   11:11 AM 11/30/2021    2:17 PM 03/20/2021    9:11 AM  Depression screen PHQ 2/9  Decreased Interest  0 0 2  Down, Depressed, Hopeless 0 1   PHQ - 2 Score 0 1 2  Altered sleeping   2  Tired, decreased energy   3  Change in appetite   2  Feeling bad or failure about yourself    1  Trouble concentrating   0  Moving slowly or fidgety/restless   0  Suicidal thoughts   0  PHQ-9 Score   10  Difficult doing work/chores   Somewhat difficult     Social History   Tobacco Use  Smoking Status Former   Packs/day: 1.00   Years: 15.00   Total pack years: 15.00   Types: Cigarettes   Quit date: 10/01/1986   Years since quitting: 36.1  Smokeless Tobacco Never  Tobacco Comments   quit about 30 yrs ago   BP Readings from Last 3 Encounters:  10/03/22 (!) 160/80  09/28/22 (!) 150/68  09/14/22 (!) 147/67   Pulse Readings from Last 3 Encounters:  10/03/22 78  09/28/22 71  09/14/22 (!) 52   Wt Readings from Last 3 Encounters:  10/03/22 147 lb (66.7 kg)  09/28/22 139 lb 15.9 oz (63.5 kg)  09/14/22 139 lb 15.9 oz (63.5 kg)   BMI Readings from Last 3 Encounters:  10/03/22 22.35 kg/m  09/28/22 21.29 kg/m  09/14/22 21.29 kg/m    No Known Allergies  Medications Reviewed Today     Reviewed by Colman Cater, CMA (Certified Medical Assistant) on 10/03/22 at 63  Med List Status: <None>   Medication Order Taking? Sig Documenting Provider Last Dose Status Informant  aspirin EC 81 MG tablet [id number] Yes Take 81 mg by mouth daily. [provider] Taking Active Self  brimonidine (ALPHAGAN) 0.2 % ophthalmic solution [id number] Yes Place 1 drop into both eyes 2 (two) times daily. Bernarda Caffey, MD Taking Active   Cholecalciferol (VITAMIN D3) 25 MCG (1000 UT) CAPS [id number] Yes Take 1 capsule (1,000 Units total) by mouth daily at 6 (six) AM. Verta Ellen., NP Taking Active Self  citalopram (CELEXA) 10 MG tablet [id number] Yes Take 10 mg by mouth daily. [provider] Taking Active   desvenlafaxine (PRISTIQ) 25 MG 24 hr tablet [id number] Yes TAKE ONE TABLET BY MOUTH DAILY.  Susy Frizzle, MD Taking Active   diltiazem [place]  CD) 120 MG 24 hr capsule [id number] Yes TAKE ONE CAPSULE (120MG TOTAL) BY MOUTH DAILY Branch, Alphonse Guild, MD Taking Active   doxazosin (CARDURA) 8 MG tablet [id number] Yes Take 8 mg by mouth daily. [provider] Taking Active Self  ENTRESTO 97-103 MG [id number] Yes TAKE ONE TABLET BY MOUTH TWICE A DAY Branch, Alphonse Guild, MD Taking Active Self  furosemide (LASIX) 20 MG tablet [id number] Yes Take 20 mg by mouth daily. [provider] Taking Active   hydrALAZINE (APRESOLINE) 50 MG tablet [id number] Yes Take 50 mg by mouth 3 (three) times daily. [provider] Taking Active Self  levothyroxine (SYNTHROID) 50 MCG tablet [id number] Yes Take 1 tablet (50 mcg total) by mouth daily before breakfast. Susy Frizzle, MD Taking Active Self  multivitamin (RENA-VIT) TABS tablet [id number] Yes Take 1 tablet by mouth daily. [provider] Taking Active Self  pantoprazole (PROTONIX) 40 MG tablet [id number] Yes TAKE ONE TABLET BY MOUTH TWICE A DAY Susy Frizzle, MD Taking Active   pravastatin (PRAVACHOL) 20 MG tablet [id number] Yes TAKE ONE TABLET (20MG TOTAL) BY MOUTH DAILY Branch, Alphonse Guild, MD Taking Active Self  sildenafil (VIAGRA) 100 MG tablet [id number] Yes Take 0.5-1 tablets (50-100 mg total) by mouth daily as needed for erectile dysfunction. Susy Frizzle, MD Taking Active   sulfamethoxazole-trimethoprim (BACTRIM) 400-80 MG tablet [id number] Yes Take 1 tablet by mouth 2 (two) times daily. Stop cipro and hold citalopram Susy Frizzle, MD Taking Active   torsemide (DEMADEX) 20 MG tablet [id number] Yes Take 20 mg by mouth daily as needed (swelling). [provider] Taking Active Self            SDOH:  (Social Determinants of Health) assessments and interventions performed: Yes Financial Resource Strain: Low Risk  (11/30/2021)   Overall Financial Resource Strain (CARDIA)    Difficulty of Paying  Living Expenses: Not hard at all   Food Insecurity: No Food Insecurity (11/30/2021)   Hunger Vital Sign    Worried About Running Out of Food in the Last Year: Never true    Ran Out of Food in the Last Year: Never true    SDOH Interventions    Flowsheet Row Clinical Support from 11/30/2021 in [place] Management from 03/20/2021 in [place] Management from 01/09/2021 in [place] Management from 11/24/2020 in [place] Management from 11/14/2020 in [place] Patient Outreach Telephone from 09/05/2020 in [place] Coordination  SDOH Interventions        Food Insecurity Interventions Intervention Not Indicated -- -- -- Intervention Not Indicated Intervention Not Indicated  Housing Interventions Intervention Not Indicated -- -- -- Intervention Not Indicated Intervention Not Indicated  Transportation Interventions Intervention Not Indicated -- -- -- Intervention Not Indicated Intervention Not Indicated  Depression Interventions/Treatment  -- Medication Medication Medication, Counseling --  [Client stopped taking zoloft, receptive to referral to social worker] --  Financial Strain Interventions Intervention Not Indicated -- -- -- Intervention Not Indicated --  Physical Activity Interventions Intervention Not Indicated -- -- -- Intervention Not Indicated  [client not interested in exercising at present] --  Stress Interventions Intervention Not Indicated -- -- -- Intervention Not Indicated --  Social Connections Interventions Intervention Not Indicated -- -- -- Intervention Not Indicated --       Medication Assistance: Application for Entresto  medication assistance program. in process.  Anticipated assistance  start date unknown.  See plan of care for additional detail.  Medication Access: Within the past 30 days, how often has  patient missed a dose of medication? 0 Is a pillbox or other method used to improve adherence? No  Factors that may affect medication adherence? financial need Are meds synced by current pharmacy? No  Are meds delivered by current pharmacy? No  Does patient experience delays in picking up medications due to transportation concerns? No   [facility] Services Reviewed: Is patient disadvantaged to use [facility]?: No  Current Rx insurance plan: [facility] Name and location of Current pharmacy:  [place], [place] [address] Phone: [phone] Fax: [place], [name] [address] Phone: [phone] Fax: [place], [address] [name] [address] Phone: [phone] Fax: [phone]  [facility] services reviewed with patient today?: Yes  Patient requests to transfer care to [facility]?: No  Reason patient declined to change pharmacies: Loyalty to other pharmacy/Patient preference  Compliance/Adherence/Medication fill history: Star Rating Drugs:  Pravastatin 20 mg last filled 08/13/2022 90 DS Entresto 97-103 mg last filled 10/02/2022 30 DS     Care Gaps: Annual wellness visit in last year? Yes   Assessment/Plan   Hypertension (BP goal <140/90) -Uncontrolled -Current treatment: Diltiazem CD 129m 24hr Appropriate, Query effective, ,  Hydralazine 585mTID Appropriate, Query effective,  Doxazosin 73m64mppropriate, Query effective, , -Medications previously tried: losartan  -Current home readings: wide range from 12099991111stolic per his reports -Current exercise habits: none organized.  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  Appears it was stopped back in May 2023 due to fatigue.  Possible he may need to start back - continue to monitor at home, will have CMA to call to check BP within 30 days.  Hyperlipidemia: (LDL goal < 70) -Controlled -Current treatment: Pravastatin 23m71mpropriate, Effective, Safe, Accessible -Medications previously tried: none noted  -Most recent LDL is 67 -51ucated on Cholesterol goals;  Benefits of statin for ASCVD risk reduction; Importance of limiting foods high in cholesterol; -Recommended to continue current medication Denies any myalgias, continue medication and routine screenings.  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 -Current medications: None -Medications previously tried: none noted  -Current home glucose readings fasting glucose: not checking post prandial glucose: not checking -Denies hypoglycemic/hyperglycemic symptoms -Educated on A1c and blood sugar goals; Complications of diabetes including kidney damage, retinal damage, and cardiovascular disease; -Counseled to check feet daily and get yearly eye exams -Recommended to continue current medication  Heart Failure (Goal: manage symptoms and prevent exacerbations) -Controlled -Last ejection fraction: 40-45% -HF type: HFmrEF (mildly reduced EF 41-49%) -NYHA Class: I (no actitivty limitation) -AHA HF Stage: B (Heart disease present - no symptoms present) -Current treatment: Entresto 97-103 BID Appropriate, Effective, Safe, Query accessible Torsemide 23mg4m Appropriate, Effective, Safe, Accessible -Medications previously tried: none  noted  -Current home  BP/HR readings: elevated, see HTN -Current home daily weights: steady -Educated on Benefits of medications for managing symptoms and prolonging life Importance of weighing daily; if you gain more than 3 pounds in one day or 5 pounds in one week, call providers -Recommended to continue current medication Had to go one month without Entresto last year due to donut hole copay.  Provided with application for patient assistance today.  Will FU to ensure he completes to avoid those same issues this year.  Beverly Milch, PharmD, CPP Clinical Pharmacist Practitioner [place] [phone]

## 2022-11-13 DIAGNOSIS — N186 End stage renal disease: Secondary | ICD-10-CM | POA: Diagnosis not present

## 2022-11-13 DIAGNOSIS — Z992 Dependence on renal dialysis: Secondary | ICD-10-CM | POA: Diagnosis not present

## 2022-11-14 ENCOUNTER — Telehealth: Payer: Self-pay | Admitting: Pharmacist

## 2022-11-14 DIAGNOSIS — Z992 Dependence on renal dialysis: Secondary | ICD-10-CM | POA: Diagnosis not present

## 2022-11-14 DIAGNOSIS — N186 End stage renal disease: Secondary | ICD-10-CM | POA: Diagnosis not present

## 2022-11-14 DIAGNOSIS — I10 Essential (primary) hypertension: Secondary | ICD-10-CM

## 2022-11-14 MED ORDER — AMLODIPINE BESYLATE 5 MG PO TABS: 5.0000 mg | ORAL_TABLET | Freq: Every day | ORAL | 1 refills | Status: DC

## 2022-11-14 NOTE — Telephone Encounter (Signed)
Will call amlodipine 35m daily into pharmacy.  CBeverly Milch PharmD, CPP Clinical Pharmacist Practitioner [facility]([phone]

## 2022-11-14 NOTE — Telephone Encounter (Signed)
-----   Message from Susy Frizzle, MD sent at 11/13/2022  4:53 PM EST ----- Start with 5 mg Thanks Tom ----- Message ----- From: Edythe Clarity, [facility] Sent: 11/13/2022   4:08 PM EST To: Susy Frizzle, MD  Just called him back to confirm and the 353 systolic was an outlier.  He says it averages around 150 and does get 120s some.  Should we start with '5mg'$  or would you go ahead with '10mg'$ ?  I will have him come in for FU visit with you after the start.  Gerald Stabs ----- Message ----- From: Susy Frizzle, MD Sent: 11/13/2022   6:49 AM EST To: Edythe Clarity, [facility]  If he quit cardizem, I would try amlodipine 10 mg poqday due to how high the bp is and recheck here in 1 week.  ----- Message ----- From: Edythe Clarity, [facility] Sent: 11/12/2022   4:23 PM EST To: Susy Frizzle, MD  BP elevated at recent visits and he reports some at home 299M systolic.  Looks like diltiazem was held back in May for fatigue.  Do you want him to resume at this time? Beverly Milch, PharmD, CPP Clinical Pharmacist Practitioner [place] [phone]

## 2022-11-15 DIAGNOSIS — N186 End stage renal disease: Secondary | ICD-10-CM | POA: Diagnosis not present

## 2022-11-15 DIAGNOSIS — Z992 Dependence on renal dialysis: Secondary | ICD-10-CM | POA: Diagnosis not present

## 2022-11-16 DIAGNOSIS — Z992 Dependence on renal dialysis: Secondary | ICD-10-CM | POA: Diagnosis not present

## 2022-11-16 DIAGNOSIS — N186 End stage renal disease: Secondary | ICD-10-CM | POA: Diagnosis not present

## 2022-11-17 DIAGNOSIS — N186 End stage renal disease: Secondary | ICD-10-CM | POA: Diagnosis not present

## 2022-11-17 DIAGNOSIS — Z992 Dependence on renal dialysis: Secondary | ICD-10-CM | POA: Diagnosis not present

## 2022-11-18 DIAGNOSIS — N186 End stage renal disease: Secondary | ICD-10-CM | POA: Diagnosis not present

## 2022-11-18 DIAGNOSIS — Z992 Dependence on renal dialysis: Secondary | ICD-10-CM | POA: Diagnosis not present

## 2022-11-19 ENCOUNTER — Other Ambulatory Visit: Payer: Self-pay | Admitting: Cardiology

## 2022-11-19 DIAGNOSIS — N186 End stage renal disease: Secondary | ICD-10-CM | POA: Diagnosis not present

## 2022-11-19 DIAGNOSIS — Z992 Dependence on renal dialysis: Secondary | ICD-10-CM | POA: Diagnosis not present

## 2022-11-20 DIAGNOSIS — Z992 Dependence on renal dialysis: Secondary | ICD-10-CM | POA: Diagnosis not present

## 2022-11-20 DIAGNOSIS — N186 End stage renal disease: Secondary | ICD-10-CM | POA: Diagnosis not present

## 2022-11-21 DIAGNOSIS — Z992 Dependence on renal dialysis: Secondary | ICD-10-CM | POA: Diagnosis not present

## 2022-11-21 DIAGNOSIS — N186 End stage renal disease: Secondary | ICD-10-CM | POA: Diagnosis not present

## 2022-11-22 DIAGNOSIS — N186 End stage renal disease: Secondary | ICD-10-CM | POA: Diagnosis not present

## 2022-11-22 DIAGNOSIS — Z992 Dependence on renal dialysis: Secondary | ICD-10-CM | POA: Diagnosis not present

## 2022-11-23 DIAGNOSIS — N186 End stage renal disease: Secondary | ICD-10-CM | POA: Diagnosis not present

## 2022-11-23 DIAGNOSIS — Z992 Dependence on renal dialysis: Secondary | ICD-10-CM | POA: Diagnosis not present

## 2022-11-24 DIAGNOSIS — N186 End stage renal disease: Secondary | ICD-10-CM | POA: Diagnosis not present

## 2022-11-24 DIAGNOSIS — Z992 Dependence on renal dialysis: Secondary | ICD-10-CM | POA: Diagnosis not present

## 2022-11-25 DIAGNOSIS — Z992 Dependence on renal dialysis: Secondary | ICD-10-CM | POA: Diagnosis not present

## 2022-11-25 DIAGNOSIS — N186 End stage renal disease: Secondary | ICD-10-CM | POA: Diagnosis not present

## 2022-11-26 DIAGNOSIS — N186 End stage renal disease: Secondary | ICD-10-CM | POA: Diagnosis not present

## 2022-11-26 DIAGNOSIS — Z992 Dependence on renal dialysis: Secondary | ICD-10-CM | POA: Diagnosis not present

## 2022-11-27 DIAGNOSIS — Z992 Dependence on renal dialysis: Secondary | ICD-10-CM | POA: Diagnosis not present

## 2022-11-27 DIAGNOSIS — N186 End stage renal disease: Secondary | ICD-10-CM | POA: Diagnosis not present

## 2022-11-28 DIAGNOSIS — Z992 Dependence on renal dialysis: Secondary | ICD-10-CM | POA: Diagnosis not present

## 2022-11-28 DIAGNOSIS — N186 End stage renal disease: Secondary | ICD-10-CM | POA: Diagnosis not present

## 2022-11-29 DIAGNOSIS — N186 End stage renal disease: Secondary | ICD-10-CM | POA: Diagnosis not present

## 2022-11-29 DIAGNOSIS — Z992 Dependence on renal dialysis: Secondary | ICD-10-CM | POA: Diagnosis not present

## 2022-11-30 ENCOUNTER — Telehealth: Payer: Self-pay | Admitting: Pharmacist

## 2022-11-30 NOTE — Progress Notes (Unsigned)
Care Management & Coordination Services Pharmacy Team  Reason for Encounter: Hypertension  Contacted patient to discuss hypertension disease state. Spoke with {Patient/Family/Caregiver:[id number]} on 11/30/2022     Current antihypertensive regimen:  Amlodipine 5 mg daily Hydralazine 50 mg three times daily Entresto 97-103 mg twice daily  Patient verbally confirms he is taking the above medications as directed. {yes/no:20286}  How often are you checking your Blood Pressure? {CHL HP BP Monitoring Frequency:[phone]}  he checks his blood pressure {timing:25218} {before/after:25217} taking his medication.  Current home BP readings: ***  DATE:             BP               PULSE   Wrist or arm cuff: Caffeine intake: Salt intake: OTC medications including pseudoephedrine or NSAIDs?  Any readings above 180/100? {yes/no:20286} If yes any symptoms of hypertensive emergency? {hypertensive emergency symptoms:25354}  What recent interventions/DTPs have been made by any provider to improve Blood Pressure control since last CPP Visit: Patient started on Amlodipine 5 mg daily on 11/14/2022  Any recent hospitalizations or ED visits since last visit with CPP? {yes/no:20286}  What diet changes have been made to improve Blood Pressure Control?  ***  What exercise is being done to improve your Blood Pressure Control?  ***  Adherence Review: Is the patient currently on ACE/ARB medication? {yes/no:20286} Does the patient have >5 day gap between last estimated fill dates? {yes/no:20286}  Star Rating Drugs:  Pravastatin 20 mg last filled 11/19/2022 90 DS Entresto 97-103 last filled 11/19/2022 30 DS   Chart Updates: Recent office visits:  None since last pharmD visit  Recent consult visits:  None since last pharmD visit  Hospital visits:  None since last pharmD visit  Medications: Outpatient Encounter Medications as of 11/30/2022  Medication Sig   amLODipine (NORVASC) 5 MG tablet Take 1  tablet (5 mg total) by mouth daily.   aspirin EC 81 MG tablet Take 81 mg by mouth daily.   brimonidine (ALPHAGAN) 0.2 % ophthalmic solution Place 1 drop into both eyes 2 (two) times daily. (Patient not taking: Reported on 11/12/2022)   Cholecalciferol (VITAMIN D3) 25 MCG (1000 UT) CAPS Take 1 capsule (1,000 Units total) by mouth daily at 6 (six) AM.   doxazosin (CARDURA) 8 MG tablet Take 8 mg by mouth daily.   furosemide (LASIX) 20 MG tablet Take 20 mg by mouth daily. (Patient not taking: Reported on 11/12/2022)   hydrALAZINE (APRESOLINE) 50 MG tablet Take 50 mg by mouth 3 (three) times daily.   levothyroxine (SYNTHROID) 50 MCG tablet Take 1 tablet (50 mcg total) by mouth daily before breakfast.   multivitamin (RENA-VIT) TABS tablet Take 1 tablet by mouth daily.   pantoprazole (PROTONIX) 40 MG tablet TAKE ONE TABLET BY MOUTH TWICE A DAY   pravastatin (PRAVACHOL) 20 MG tablet TAKE ONE TABLET ('20MG'$  TOTAL) BY MOUTH DAILY   sacubitril-valsartan (ENTRESTO) 97-103 MG TAKE ONE TABLET BY MOUTH TWICE A DAY   sildenafil (VIAGRA) 100 MG tablet Take 0.5-1 tablets (50-100 mg total) by mouth daily as needed for erectile dysfunction.   torsemide (DEMADEX) 20 MG tablet Take 20 mg by mouth daily as needed (swelling).   No facility-administered encounter medications on file as of 11/30/2022.    Recent Office Vitals: BP Readings from Last 3 Encounters:  10/03/22 (!) 160/80  09/28/22 (!) 150/68  09/14/22 (!) 147/67   Pulse Readings from Last 3 Encounters:  10/03/22 78  09/28/22 71  09/14/22 (!) 52  Wt Readings from Last 3 Encounters:  10/03/22 147 lb (66.7 kg)  09/28/22 139 lb 15.9 oz (63.5 kg)  09/14/22 139 lb 15.9 oz (63.5 kg)     Kidney Function Lab Results  Component Value Date/Time   CREATININE 3.51 (H) 05/28/2022 12:07 PM   CREATININE 3.84 (H) 02/15/2022 12:25 PM   CREATININE 4.20 (H) 01/25/2022 09:51 AM   CREATININE 2.84 (H) 05/01/2019 09:45 AM   GFRNONAA 17 (L) 05/28/2022 12:07 PM    GFRNONAA 21 (L) 05/01/2019 09:45 AM   GFRAA 24 (L) 05/01/2019 09:45 AM       Latest Ref Rng & Units 05/28/2022   12:07 PM 02/15/2022   12:25 PM 01/25/2022    9:51 AM  BMP  Glucose 70 - 99 mg/dL 131  120  111   BUN 8 - 23 mg/dL 53  68  59   Creatinine 0.61 - 1.24 mg/dL 3.51  3.84  4.20   BUN/Creat Ratio 6 - 22 (calc)  18    Sodium 135 - 145 mmol/L 135  141  139   Potassium 3.5 - 5.1 mmol/L 4.6  5.6  4.9   Chloride 98 - 111 mmol/L 107  107  115   CO2 22 - 32 mmol/L 19  23    Calcium 8.9 - 10.3 mg/dL 8.8  9.6       Future Appointments  Date Time Provider [place]  12/06/2022  2:00 PM BSFM-NURSE HEALTH ADVISOR [facility]  12/10/2022  9:05 AM Hayden Pedro, MD TRE-TRE None  01/24/2023 11:20 AM Arnoldo Lenis, MD [facility]  05/13/2023  9:30 AM Bernarda Caffey, MD TRE-TRE None  05/16/2023  3:30 PM Edythe Clarity, [facility] CH[facility] None  08/19/2023 11:30 AM MC-CV EDEN [place] [facility] CV-EDEN None   April D Calhoun, [place] Pharmacist Assistant [phone]

## 2022-12-05 LAB — HM DIABETES EYE EXAM

## 2022-12-06 ENCOUNTER — Ambulatory Visit (INDEPENDENT_AMBULATORY_CARE_PROVIDER_SITE_OTHER): Payer: Medicare Other

## 2022-12-06 VITALS — BP 136/74 | Ht 68.0 in | Wt 149.0 lb

## 2022-12-06 DIAGNOSIS — Z0000 Encounter for general adult medical examination without abnormal findings: Secondary | ICD-10-CM

## 2022-12-06 NOTE — Progress Notes (Signed)
Subjective:   Bradley Black is a 77 y.o. male who presents for Medicare Annual/Subsequent preventive examination.  Review of Systems     Cardiac Risk Factors include: advanced age (>58mn, >>69women);diabetes mellitus;dyslipidemia;hypertension;male gender     Objective:    Today's Vitals   12/06/22 1402  BP: 136/74  Weight: 149 lb (67.6 kg)  Height: '5\' 8"'$  (1.727 m)   Body mass index is 22.66 kg/m.     12/06/2022    2:21 PM 09/28/2022    9:13 AM 09/11/2022    1:00 PM 07/23/2022   11:12 AM 05/28/2022   11:15 AM 05/02/2022    2:01 PM 11/30/2021    2:22 PM  Advanced Directives  Does Patient Have a Medical Advance Directive? No No No No No No No  Would patient like information on creating a medical advance directive? No - Patient declined No - Patient declined No - Patient declined No - Patient declined No - Patient declined Yes (MAU/Ambulatory/Procedural Areas - Information given) No - Patient declined    Current Medications (verified) Outpatient Encounter Medications as of 12/06/2022  Medication Sig   amLODipine (NORVASC) 5 MG tablet Take 1 tablet (5 mg total) by mouth daily.   aspirin EC 81 MG tablet Take 81 mg by mouth daily.   brimonidine (ALPHAGAN) 0.2 % ophthalmic solution Place 1 drop into both eyes 2 (two) times daily.   Cholecalciferol (VITAMIN D3) 25 MCG (1000 UT) CAPS Take 1 capsule (1,000 Units total) by mouth daily at 6 (six) AM.   doxazosin (CARDURA) 8 MG tablet Take 8 mg by mouth daily.   furosemide (LASIX) 20 MG tablet Take 20 mg by mouth daily.   hydrALAZINE (APRESOLINE) 50 MG tablet Take 50 mg by mouth 3 (three) times daily.   levothyroxine (SYNTHROID) 50 MCG tablet Take 1 tablet (50 mcg total) by mouth daily before breakfast.   multivitamin (RENA-VIT) TABS tablet Take 1 tablet by mouth daily.   pantoprazole (PROTONIX) 40 MG tablet TAKE ONE TABLET BY MOUTH TWICE A DAY   pravastatin (PRAVACHOL) 20 MG tablet TAKE ONE TABLET ('20MG'$  TOTAL) BY MOUTH DAILY    sacubitril-valsartan (ENTRESTO) 97-103 MG TAKE ONE TABLET BY MOUTH TWICE A DAY   sildenafil (VIAGRA) 100 MG tablet Take 0.5-1 tablets (50-100 mg total) by mouth daily as needed for erectile dysfunction.   torsemide (DEMADEX) 20 MG tablet Take 20 mg by mouth daily as needed (swelling).   No facility-administered encounter medications on file as of 12/06/2022.    Allergies (verified) Patient has no known allergies.   History: Past Medical History:  Diagnosis Date   BPH (benign prostatic hyperplasia)    CAD S/P percutaneous coronary angioplasty    a. PTCA of [place]. PCI with BMS to LAD in 1997 c. RCA PCI [place]. s/p CABG in 11/2011 with LIMA-LAD, SVG-PDA, SVG-OM2, and SVG-D1   Cataract    Chronic back pain    CKD (chronic kidney disease), stage III (HCC)    Cyst of bursa    R shoulder   Diabetic retinopathy ([place]    DM (diabetes mellitus), type 2 with renal complications (HCC)    Essential hypertension    Frequent PVCs    GERD (gastroesophageal reflux disease)    Hypertensive retinopathy    Ischemic cardiomyopathy 11/2011   Intra-OP TEE: EF 40-45%, no regional WMA; improved Anterior WM post CABG.   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   LIMA to LAD, SVG to D1, SVG to OM2, SVG to PDA, EVH via right thigh and leg   Past Surgical History:  Procedure Laterality Date   AV FISTULA PLACEMENT Left 07/28/2020   Procedure: LEFT ARM ARTERIOVENOUS (AV) FISTULA  CREATION;  Surgeon: Rosetta Posner, MD;  Location: [facility];  Service: Vascular;  Laterality: Left;   BACK SURGERY  10/01/1968   BIOPSY  01/25/2022   Procedure: BIOPSY;  Surgeon: Daneil Dolin, MD;  [facility];  Service: Endoscopy;;   CARDIAC CATHETERIZATION  10/02/2011   CATARACT EXTRACTION W/PHACO Left 09/14/2022   Procedure: CATARACT EXTRACTION PHACO AND INTRAOCULAR LENS PLACEMENT ([place]);   Surgeon: Baruch Goldmann, MD;  Location: [facility];  Service: Ophthalmology;  Laterality: Left;  CDE: 8.66   CATARACT EXTRACTION W/PHACO Right 09/28/2022   Procedure: CATARACT EXTRACTION PHACO AND INTRAOCULAR LENS PLACEMENT ([facility]);  Surgeon: Baruch Goldmann, MD;  Location: [facility];  Service: Ophthalmology;  Laterality: Right;  CDE 7.79   COLONOSCOPY WITH PROPOFOL N/A 01/25/2022   Procedure: COLONOSCOPY WITH PROPOFOL;  Surgeon: Daneil Dolin, MD;  [facility];  Service: Endoscopy;  Laterality: N/A;  10:30am   CORONARY ANGIOPLASTY  10/01/1992   OM   CORONARY ANGIOPLASTY  10/02/1995   LAD   CORONARY ANGIOPLASTY WITH STENT PLACEMENT  10/01/1997   RCA   CORONARY ANGIOPLASTY WITH STENT PLACEMENT  10/02/1999   RCA   CORONARY ARTERY BYPASS GRAFT  12/27/2011   Procedure: CORONARY ARTERY BYPASS GRAFTING (CABG);  Surgeon: Rexene Alberts, MD;  Location: [place] [facility];  Service: Open Heart Surgery;  Laterality: N/A;  Times four. On pump. Using endoscopically harvested right greater saphenous vein and left internal mammary artery.    ESOPHAGOGASTRODUODENOSCOPY (EGD) WITH PROPOFOL N/A 01/25/2022   Procedure: ESOPHAGOGASTRODUODENOSCOPY (EGD) WITH PROPOFOL;  Surgeon: Daneil Dolin, MD;  [facility];  Service: Endoscopy;  Laterality: N/A;   HERNIA REPAIR Bilateral    INCISION AND DRAINAGE OF WOUND  10/01/2004   [place]   INSERTION OF DIALYSIS CATHETER Right 07/25/2020   Procedure: INSERTION OF DIALYSIS CATHETER;  Surgeon: Virl Cagey, MD;  Location: [facility];  Service: General;  Laterality: Right;   INTRAOPERATIVE TRANSESOPHAGEAL ECHOCARDIOGRAM  12/27/2011   Global hypokinesis with EF of 40-45%, improved LAD distribution wall motion.   LEFT HEART CATHETERIZATION WITH CORONARY ANGIOGRAM N/A 12/13/2011   Procedure: LEFT HEART CATHETERIZATION WITH CORONARY ANGIOGRAM;  Surgeon: Leonie Man, MD;  [facility] C[facility];  Service: Cardiovascular;  Laterality: N/A;   LUMBAR FUSION     MASS  EXCISION  10/24/2011   R arm   POLYPECTOMY  01/25/2022   Procedure: POLYPECTOMY;  Surgeon: Daneil Dolin, MD;  [facility];  Service: Endoscopy;;   RIGHT HEART CATH N/A 07/12/2020   Procedure: RIGHT HEART CATH;  Surgeon: Belva Crome, MD;  Location: [place] [facility];  Service: Cardiovascular;  Laterality: N/A;   TRANSESOPHAGEAL ECHOCARDIOGRAM  10/02/2011   Family History  Problem Relation Age of Onset   Cancer Mother        breast   Cancer Father        bone   [place] cancer Neg Hx    Social History   Socioeconomic History   Marital status: Married    Spouse name: [name]   Number of children: 1   Years of education: college  Highest education level: Bachelor's degree (e.g., BA, AB, BS)  Occupational History   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  Tobacco Use   Smoking status: Former    Packs/day: 1.00    Years: 15.00    Total pack years: 15.00    Types: Cigarettes    Quit date: 10/01/1986    Years since quitting: 36.2   Smokeless tobacco: Never   Tobacco comments:    quit about 30 yrs ago  Vaping Use   Vaping Use: Never used  Substance and Sexual Activity   Alcohol use: No   Drug use: No   Sexual activity: Not Currently  Other Topics Concern   Not on file  Social History Narrative   Married,  father of one,  grandfather 70. Works out at [employer] at [employer] roughly 2-3 days a week. He works on a treadmill. He does note having a hard time getting his heart rate up.   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   He quit smoking in 1988, and does not drink alcohol.   Social Determinants of Health   Financial Resource Strain: Low Risk  (12/06/2022)   Overall Financial Resource Strain (CARDIA)    Difficulty of Paying Living Expenses: Not hard at all  Food Insecurity: No Food Insecurity (12/06/2022)   Hunger Vital Sign    Worried About Running Out of Food in the Last Year: Never true    Ran Out of Food in the Last Year: Never true   Transportation Needs: No Transportation Needs (12/06/2022)   PRAPARE - Hydrologist (Medical): No    Lack of Transportation (Non-Medical): No  Physical Activity: Insufficiently Active (12/06/2022)   Exercise Vital Sign    Days of Exercise per Week: 3 days    Minutes of Exercise per Session: 30 min  Stress: No Stress Concern Present (12/06/2022)   [place]    Feeling of Stress : Not at all  Social Connections: Moderately Integrated (12/06/2022)   Social Connection and Isolation Panel [NHANES]    Frequency of Communication with Friends and Family: More than three times a week    Frequency of Social Gatherings with Friends and Family: More than three times a week    Attends Religious Services: 1 to 4 times per year    Active Member of [employer] or Organizations: No    Attends Music therapist: Never    Marital Status: Married    Tobacco Counseling Counseling given: Not Answered Tobacco comments: quit about 30 yrs ago   Clinical Intake:  Pre-visit preparation completed: Yes  Pain : No/denies pain  Diabetes: Yes CBG done?: No Did pt. bring in CBG monitor from home?: No  How often do you need to have someone help you when you read instructions, pamphlets, or other written materials from your doctor or pharmacy?: 1 - Never  Diabetic?Yes   Nutrition Risk Assessment:  Has the patient had any N/V/D within the last 2 months?  No  Does the patient have any non-healing wounds?  No  Has the patient had any unintentional weight loss or weight gain?  No   Diabetes:  Is the patient diabetic?  Yes  If diabetic, was a CBG obtained today?  No  Did the patient bring in their glucometer from home?  No  How often do you monitor your CBG's? daily.   Financial Strains and Diabetes Management:  Are you having any financial strains with the device, your supplies or your medication? No .   Does the patient want to be seen by Chronic Care Management for management of their diabetes?  No  Would the patient like to be referred to a Nutritionist or for Diabetic Management?  No   Diabetic Exams:  Diabetic Eye Exam: Completed records requested  Diabetic Foot Exam: Overdue, Pt has been advised about the importance in completing this exam. Pt is scheduled for diabetic foot exam on at next office visit .   Interpreter Needed?: No  Information entered by :: Denman George LPN   Activities of Daily Living    12/06/2022    2:21 PM 09/11/2022    1:02 PM  In your present state of health, do you have any difficulty performing the following activities:  Hearing? 0   Vision? 0   Difficulty concentrating or making decisions? 0   Walking or climbing stairs? 0   Dressing or bathing? 0   Doing errands, shopping? 0 0  Preparing Food and eating ? N   Using the Toilet? N   In the past six months, have you accidently leaked urine? N   Do you have problems with loss of bowel control? N   Managing your Medications? N   Managing your Finances? N   Housekeeping or managing your Housekeeping? N     Patient Care Team: Susy Frizzle, MD as PCP - General (Family Medicine) Thompson Grayer, MD (Inactive) as PCP - Electrophysiology (Cardiology) Harl Bowie Alphonse Guild, MD as PCP - Cardiology (Cardiology) Gala Romney Cristopher Estimable, MD as Attending Physician (Gastroenterology) Bensimhon, Shaune Pascal, MD as Consulting Physician (Cardiology) Liana Gerold, MD as Consulting Physician (Nephrology) Marilynn Rail Jossie Ng, NP as Nurse Practitioner (Cardiology) Early, Arvilla Meres, MD as Consulting Physician (Vascular Surgery) D[facility], [facility] Caprice Red, MD as Consulting Physician (Nephrology)  Indicate any recent Medical Services you may have received from other than [facility] providers in the past year (date may be approximate).     Assessment:   This is a routine wellness examination for  E[place].  Hearing/Vision screen Hearing Screening - Comments:: Hard of hearing   Vision Screening - Comments:: up to date with routine eye exams with Dr. Leticia Clas    Dietary issues and exercise activities discussed: Current Exercise Habits: Home exercise routine, Type of exercise: walking, Time (Minutes): 30, Frequency (Times/Week): 3, Weekly Exercise (Minutes/Week): 90, Intensity: Mild   Goals Addressed             This Visit's Progress    Remain active and independent         Depression Screen    12/06/2022    2:20 PM 07/23/2022   11:11 AM 11/30/2021    2:17 PM 03/20/2021    9:11 AM 03/20/2021    9:09 AM 01/09/2021    2:41 PM 11/25/2020    7:38 AM  PHQ 2/9 Scores  PHQ - 2 Score 0 0 '1 2 1 2 2  '$ PHQ- 9 Score    '10  8 7    '$ Fall Risk    12/06/2022    2:19 PM 07/23/2022   11:11 AM 05/09/2022   11:33 AM 05/02/2022    2:00 PM 11/30/2021    2:22 PM  [place] in the past year? 0 0 0 0 0  Number falls in past yr: 0  0  0  Injury with Fall? 0    0  Risk for  fall due to : No Fall Risks  No Fall Risks  No Fall Risks  Follow up Falls prevention discussed;Education provided;Falls evaluation completed  Falls evaluation completed  Falls prevention discussed    FALL RISK PREVENTION PERTAINING TO THE HOME:  Any stairs in or around the home? No  If so, are there any without handrails? No  Home free of loose throw rugs in walkways, pet beds, electrical cords, etc? Yes  Adequate lighting in your home to reduce risk of falls? Yes   ASSISTIVE DEVICES UTILIZED TO PREVENT FALLS:  Life alert? No  Use of a cane, walker or w/c? No  Grab bars in the bathroom? Yes  Shower chair or bench in shower? No  Elevated toilet seat or a handicapped toilet? Yes   TIMED UP AND GO:  Was the test performed? Yes .  Length of time to ambulate 10 feet: 6 sec.   Gait steady and fast without use of assistive device  Cognitive Function:        12/06/2022    2:22 PM 11/30/2021    2:26 PM  6CIT  Screen  What Year? 0 points 0 points  What month? 0 points 0 points  What time? 0 points 0 points  Count back from 20 0 points 0 points  Months in reverse 0 points 0 points  Repeat phrase 0 points 0 points  Total Score 0 points 0 points    Immunizations Immunization History  Administered Date(s) Administered   Fluad Quad(high Dose 65+) 07/10/2020   Hepatitis B, ADULT 09/17/2020, 10/18/2020, 11/17/2020, 04/07/2021   Hepatitis B, PED/ADOLESCENT 09/17/2020, 10/18/2020   Influenza Split 07/12/2015, 08/10/2016, 07/01/2017, 07/29/2019   Influenza, High Dose Seasonal PF 08/10/2016, 07/31/2017, 07/29/2019, 07/10/2020   Influenza,inj,Quad PF,6+ Mos 07/12/2015   Influenza,inj,quad, With Preservative 07/01/2017   Influenza-Unspecified 06/20/2020, 07/06/2021   PFIZER(Purple Top)SARS-COV-2 Vaccination 11/07/2019, 11/28/2019   PPD Test 09/15/2020   Pneumococcal Conjugate-13 02/12/2013, 04/19/2015   Pneumococcal Polysaccharide-23 02/12/2013   Tdap 10/01/2001   Zoster, Live 03/04/2007    TDAP status: Due, Education has been provided regarding the importance of this vaccine. Advised may receive this vaccine at local pharmacy or Health Dept. Aware to provide a copy of the vaccination record if obtained from local pharmacy or Health Dept. Verbalized acceptance and understanding.  Flu Vaccine status: Up to date  Pneumococcal vaccine status: Up to date  Covid-19 vaccine status: Information provided on how to obtain vaccines.   Qualifies for Shingles Vaccine? Yes   Zostavax completed No   Shingrix Completed?: No.    Education has been provided regarding the importance of this vaccine. Patient has been advised to call insurance company to determine out of pocket expense if they have not yet received this vaccine. Advised may also receive vaccine at local pharmacy or Health Dept. Verbalized acceptance and understanding.  Screening Tests Health Maintenance  Topic Date Due   Zoster Vaccines-  Shingrix (1 of 2) Never done   DTaP/Tdap/Td (2 - Td or Tdap) 10/02/2011   COVID-19 Vaccine (3 - Pfizer risk series) 12/26/2019   FOOT EXAM  04/30/2020   HEMOGLOBIN A1C  01/06/2021   OPHTHALMOLOGY EXAM  11/13/2022   Medicare Annual Wellness (AWV)  12/06/2023   Pneumonia Vaccine 31+ Years old  Completed   INFLUENZA VACCINE  Completed   Hepatitis C Screening  Completed   HPV VACCINES  Aged Out   Fecal DNA (Cologuard)  Discontinued    Health Maintenance  Health Maintenance Due  Topic Date Due  Zoster Vaccines- Shingrix (1 of 2) Never done   DTaP/Tdap/Td (2 - Td or Tdap) 10/02/2011   COVID-19 Vaccine (3 - Pfizer risk series) 12/26/2019   FOOT EXAM  04/30/2020   HEMOGLOBIN A1C  01/06/2021   OPHTHALMOLOGY EXAM  11/13/2022    Colorectal cancer screening: No longer required.   Lung Cancer Screening: (Low Dose CT Chest recommended if Age 67-80 years, 30 pack-year currently smoking OR have quit w/in 15years.) does not qualify.   Lung Cancer Screening Referral: n/a  Additional Screening:  Hepatitis C Screening: does qualify; Completed 07/21/20  Vision Screening: Recommended annual ophthalmology exams for early detection of glaucoma and other disorders of the eye. Is the patient up to date with their annual eye exam?  Yes  Who is the provider or what is the name of the office in which the patient attends annual eye exams? Dr. Leticia Clas  If pt is not established with a provider, would they like to be referred to a provider to establish care? No .   Dental Screening: Recommended annual dental exams for proper oral hygiene  Community Resource Referral / Chronic Care Management: CRR required this visit?  No   CCM required this visit?   Currently receiving services      Plan:     I have personally reviewed and noted the following in the patient's chart:   Medical and social history Use of alcohol, tobacco or illicit drugs  Current medications and supplements including  opioid prescriptions. Patient is not currently taking opioid prescriptions. Functional ability and status Nutritional status Physical activity Advanced directives List of other physicians Hospitalizations, surgeries, and ER visits in previous 12 months Vitals Screenings to include cognitive, depression, and falls Referrals and appointments  In addition, I have reviewed and discussed with patient certain preventive protocols, quality metrics, and best practice recommendations. A written personalized care plan for preventive services as well as general preventive health recommendations were provided to patient.     Denman George [place], [place]   [id number]   Nurse Notes: No concerns

## 2022-12-06 NOTE — Patient Instructions (Signed)
Bradley Black , Thank you for taking time to come for your Medicare Wellness Visit. I appreciate your ongoing commitment to your health goals. Please review the following plan we discussed and let me know if I can assist you in the future.   These are the goals we discussed:  Goals      Remain active and independent        This is a list of the screening recommended for you and due dates:  Health Maintenance  Topic Date Due   Zoster (Shingles) Vaccine (1 of 2) Never done   DTaP/Tdap/Td vaccine (2 - Td or Tdap) 10/02/2011   COVID-19 Vaccine (3 - Pfizer risk series) 12/26/2019   Complete foot exam   04/30/2020   Hemoglobin A1C  01/06/2021   Medicare Annual Wellness Visit  12/01/2022   Eye exam for diabetics  11/13/2022   Pneumonia Vaccine  Completed   Flu Shot  Completed   Hepatitis C Screening: USPSTF Recommendation to screen - Ages 18-79 yo.  Completed   HPV Vaccine  Aged Out   Cologuard (Stool DNA test)  Discontinued    Advanced directives: Forms are available if you choose in the future to pursue completion.  This is recommended in order to make sure that your health wishes are honored in the event that you are unable to verbalize them to the provider.    Conditions/risks identified: Aim for 30 minutes of exercise or brisk walking, 6-8 glasses of water, and 5 servings of fruits and vegetables each day.   Next appointment: Follow up in one year for your annual wellness visit.    I will request your labs from Dr. Holley Raring for Dr. Dennard Schaumann to review   Preventive Care 79 Years and Older, Male  Preventive care refers to lifestyle choices and visits with your health care provider that can promote health and wellness. What does preventive care include? A yearly physical exam. This is also called an annual well check. Dental exams once or twice a year. Routine eye exams. Ask your health care provider how often you should have your eyes checked. Personal lifestyle choices,  including: Daily care of your teeth and gums. Regular physical activity. Eating a healthy diet. Avoiding tobacco and drug use. Limiting alcohol use. Practicing safe sex. Taking low doses of aspirin every day. Taking vitamin and mineral supplements as recommended by your health care provider. What happens during an annual well check? The services and screenings done by your health care provider during your annual well check will depend on your age, overall health, lifestyle risk factors, and family history of disease. Counseling  Your health care provider may ask you questions about your: Alcohol use. Tobacco use. Drug use. Emotional well-being. Home and relationship well-being. Sexual activity. Eating habits. History of falls. Memory and ability to understand (cognition). Work and work Statistician. Screening  You may have the following tests or measurements: Height, weight, and BMI. Blood pressure. Lipid and cholesterol levels. These may be checked every 5 years, or more frequently if you are over 14 years old. Skin check. Lung cancer screening. You may have this screening every year starting at age 41 if you have a 30-pack-year history of smoking and currently smoke or have quit within the past 15 years. Fecal occult blood test (FOBT) of the stool. You may have this test every year starting at age 81. Flexible sigmoidoscopy or colonoscopy. You may have a sigmoidoscopy every 5 years or a colonoscopy every 10 years starting at  age 79. Prostate cancer screening. Recommendations will vary depending on your family history and other risks. Hepatitis C blood test. Hepatitis B blood test. Sexually transmitted disease (STD) testing. Diabetes screening. This is done by checking your blood sugar (glucose) after you have not eaten for a while (fasting). You may have this done every 1-3 years. Abdominal aortic aneurysm (AAA) screening. You may need this if you are a current or former  smoker. Osteoporosis. You may be screened starting at age 79 if you are at high risk. Talk with your health care provider about your test results, treatment options, and if necessary, the need for more tests. Vaccines  Your health care provider may recommend certain vaccines, such as: Influenza vaccine. This is recommended every year. Tetanus, diphtheria, and acellular pertussis (Tdap, Td) vaccine. You may need a Td booster every 10 years. Zoster vaccine. You may need this after age 79. Pneumococcal 13-valent conjugate (PCV13) vaccine. One dose is recommended after age 79. Pneumococcal polysaccharide (PPSV23) vaccine. One dose is recommended after age 79. Talk to your health care provider about which screenings and vaccines you need and how often you need them. This information is not intended to replace advice given to you by your health care provider. Make sure you discuss any questions you have with your health care provider. Document Released: 10/14/2015 Document Revised: 06/06/2016 Document Reviewed: 07/19/2015 Elsevier Interactive Patient Education  2017 [place] Prevention in the Home Falls can cause injuries. They can happen to people of all ages. There are many things you can do to make your home safe and to help prevent falls. What can I do on the outside of my home? Regularly fix the edges of walkways and driveways and fix any cracks. Remove anything that might make you trip as you walk through a door, such as a raised step or threshold. Trim any bushes or trees on the path to your home. Use bright outdoor lighting. Clear any walking paths of anything that might make someone trip, such as rocks or tools. Regularly check to see if handrails are loose or broken. Make sure that both sides of any steps have handrails. Any raised decks and porches should have guardrails on the edges. Have any leaves, snow, or ice cleared regularly. Use sand or salt on walking paths during  winter. Clean up any spills in your garage right away. This includes oil or grease spills. What can I do in the bathroom? Use night lights. Install grab bars by the toilet and in the tub and shower. Do not use towel bars as grab bars. Use non-skid mats or decals in the tub or shower. If you need to sit down in the shower, use a plastic, non-slip stool. Keep the floor dry. Clean up any water that spills on the floor as soon as it happens. Remove soap buildup in the tub or shower regularly. Attach bath mats securely with double-sided non-slip rug tape. Do not have throw rugs and other things on the floor that can make you trip. What can I do in the bedroom? Use night lights. Make sure that you have a light by your bed that is easy to reach. Do not use any sheets or blankets that are too big for your bed. They should not hang down onto the floor. Have a firm chair that has side arms. You can use this for support while you get dressed. Do not have throw rugs and other things on the floor that can make  you trip. What can I do in the kitchen? Clean up any spills right away. Avoid walking on wet floors. Keep items that you use a lot in easy-to-reach places. If you need to reach something above you, use a strong step stool that has a grab bar. Keep electrical cords out of the way. Do not use floor polish or wax that makes floors slippery. If you must use wax, use non-skid floor wax. Do not have throw rugs and other things on the floor that can make you trip. What can I do with my stairs? Do not leave any items on the stairs. Make sure that there are handrails on both sides of the stairs and use them. Fix handrails that are broken or loose. Make sure that handrails are as long as the stairways. Check any carpeting to make sure that it is firmly attached to the stairs. Fix any carpet that is loose or worn. Avoid having throw rugs at the top or bottom of the stairs. If you do have throw rugs,  attach them to the floor with carpet tape. Make sure that you have a light switch at the top of the stairs and the bottom of the stairs. If you do not have them, ask someone to add them for you. What else can I do to help prevent falls? Wear shoes that: Do not have high heels. Have rubber bottoms. Are comfortable and fit you well. Are closed at the toe. Do not wear sandals. If you use a stepladder: Make sure that it is fully opened. Do not climb a closed stepladder. Make sure that both sides of the stepladder are locked into place. Ask someone to hold it for you, if possible. Clearly mark and make sure that you can see: Any grab bars or handrails. First and last steps. Where the edge of each step is. Use tools that help you move around (mobility aids) if they are needed. These include: Canes. Walkers. Scooters. Crutches. Turn on the lights when you go into a dark area. Replace any light bulbs as soon as they burn out. Set up your furniture so you have a clear path. Avoid moving your furniture around. If any of your floors are uneven, fix them. If there are any pets around you, be aware of where they are. Review your medicines with your doctor. Some medicines can make you feel dizzy. This can increase your chance of falling. Ask your doctor what other things that you can do to help prevent falls. This information is not intended to replace advice given to you by your health care provider. Make sure you discuss any questions you have with your health care provider. Document Released: 07/14/2009 Document Revised: 02/23/2016 Document Reviewed: 10/22/2014 Elsevier Interactive Patient Education  2017 [employer].

## 2022-12-10 ENCOUNTER — Encounter (INDEPENDENT_AMBULATORY_CARE_PROVIDER_SITE_OTHER): Payer: Medicare Other | Admitting: Ophthalmology

## 2022-12-10 DIAGNOSIS — H59033 Cystoid macular edema following cataract surgery, bilateral: Secondary | ICD-10-CM

## 2022-12-10 DIAGNOSIS — H35033 Hypertensive retinopathy, bilateral: Secondary | ICD-10-CM

## 2022-12-10 DIAGNOSIS — I10 Essential (primary) hypertension: Secondary | ICD-10-CM | POA: Diagnosis not present

## 2022-12-10 DIAGNOSIS — H35073 Retinal telangiectasis, bilateral: Secondary | ICD-10-CM

## 2022-12-10 DIAGNOSIS — H43813 Vitreous degeneration, bilateral: Secondary | ICD-10-CM

## 2022-12-18 ENCOUNTER — Telehealth: Payer: Self-pay | Admitting: *Deleted

## 2022-12-18 NOTE — Telephone Encounter (Signed)
Called to notify pt that he was approved for [employer] patient assistance. No answer. Left msg to call back.

## 2022-12-18 NOTE — Telephone Encounter (Signed)
Patient is returning call.  °

## 2022-12-19 NOTE — Telephone Encounter (Signed)
Pt notified that he was approved for [facility] patient assistance.

## 2022-12-31 DIAGNOSIS — N186 End stage renal disease: Secondary | ICD-10-CM | POA: Diagnosis not present

## 2022-12-31 DIAGNOSIS — Z992 Dependence on renal dialysis: Secondary | ICD-10-CM | POA: Diagnosis not present

## 2023-01-01 DIAGNOSIS — Z992 Dependence on renal dialysis: Secondary | ICD-10-CM | POA: Diagnosis not present

## 2023-01-01 DIAGNOSIS — N186 End stage renal disease: Secondary | ICD-10-CM | POA: Diagnosis not present

## 2023-01-02 DIAGNOSIS — N186 End stage renal disease: Secondary | ICD-10-CM | POA: Diagnosis not present

## 2023-01-02 DIAGNOSIS — Z992 Dependence on renal dialysis: Secondary | ICD-10-CM | POA: Diagnosis not present

## 2023-01-03 DIAGNOSIS — N186 End stage renal disease: Secondary | ICD-10-CM | POA: Diagnosis not present

## 2023-01-03 DIAGNOSIS — Z992 Dependence on renal dialysis: Secondary | ICD-10-CM | POA: Diagnosis not present

## 2023-01-04 DIAGNOSIS — N186 End stage renal disease: Secondary | ICD-10-CM | POA: Diagnosis not present

## 2023-01-04 DIAGNOSIS — Z992 Dependence on renal dialysis: Secondary | ICD-10-CM | POA: Diagnosis not present

## 2023-01-05 DIAGNOSIS — N186 End stage renal disease: Secondary | ICD-10-CM | POA: Diagnosis not present

## 2023-01-05 DIAGNOSIS — Z992 Dependence on renal dialysis: Secondary | ICD-10-CM | POA: Diagnosis not present

## 2023-01-06 DIAGNOSIS — Z992 Dependence on renal dialysis: Secondary | ICD-10-CM | POA: Diagnosis not present

## 2023-01-06 DIAGNOSIS — N186 End stage renal disease: Secondary | ICD-10-CM | POA: Diagnosis not present

## 2023-01-07 DIAGNOSIS — Z992 Dependence on renal dialysis: Secondary | ICD-10-CM | POA: Diagnosis not present

## 2023-01-07 DIAGNOSIS — N186 End stage renal disease: Secondary | ICD-10-CM | POA: Diagnosis not present

## 2023-01-08 DIAGNOSIS — Z992 Dependence on renal dialysis: Secondary | ICD-10-CM | POA: Diagnosis not present

## 2023-01-08 DIAGNOSIS — N186 End stage renal disease: Secondary | ICD-10-CM | POA: Diagnosis not present

## 2023-01-09 DIAGNOSIS — Z992 Dependence on renal dialysis: Secondary | ICD-10-CM | POA: Diagnosis not present

## 2023-01-09 DIAGNOSIS — N186 End stage renal disease: Secondary | ICD-10-CM | POA: Diagnosis not present

## 2023-01-10 DIAGNOSIS — Z992 Dependence on renal dialysis: Secondary | ICD-10-CM | POA: Diagnosis not present

## 2023-01-10 DIAGNOSIS — N186 End stage renal disease: Secondary | ICD-10-CM | POA: Diagnosis not present

## 2023-01-11 DIAGNOSIS — Z992 Dependence on renal dialysis: Secondary | ICD-10-CM | POA: Diagnosis not present

## 2023-01-11 DIAGNOSIS — N186 End stage renal disease: Secondary | ICD-10-CM | POA: Diagnosis not present

## 2023-01-12 DIAGNOSIS — Z992 Dependence on renal dialysis: Secondary | ICD-10-CM | POA: Diagnosis not present

## 2023-01-12 DIAGNOSIS — N186 End stage renal disease: Secondary | ICD-10-CM | POA: Diagnosis not present

## 2023-01-13 DIAGNOSIS — N186 End stage renal disease: Secondary | ICD-10-CM | POA: Diagnosis not present

## 2023-01-13 DIAGNOSIS — Z992 Dependence on renal dialysis: Secondary | ICD-10-CM | POA: Diagnosis not present

## 2023-01-14 DIAGNOSIS — N186 End stage renal disease: Secondary | ICD-10-CM | POA: Diagnosis not present

## 2023-01-14 DIAGNOSIS — Z992 Dependence on renal dialysis: Secondary | ICD-10-CM | POA: Diagnosis not present

## 2023-01-15 DIAGNOSIS — Z992 Dependence on renal dialysis: Secondary | ICD-10-CM | POA: Diagnosis not present

## 2023-01-15 DIAGNOSIS — N186 End stage renal disease: Secondary | ICD-10-CM | POA: Diagnosis not present

## 2023-01-16 DIAGNOSIS — N186 End stage renal disease: Secondary | ICD-10-CM | POA: Diagnosis not present

## 2023-01-16 DIAGNOSIS — Z992 Dependence on renal dialysis: Secondary | ICD-10-CM | POA: Diagnosis not present

## 2023-01-17 DIAGNOSIS — Z992 Dependence on renal dialysis: Secondary | ICD-10-CM | POA: Diagnosis not present

## 2023-01-17 DIAGNOSIS — N186 End stage renal disease: Secondary | ICD-10-CM | POA: Diagnosis not present

## 2023-01-18 DIAGNOSIS — Z992 Dependence on renal dialysis: Secondary | ICD-10-CM | POA: Diagnosis not present

## 2023-01-18 DIAGNOSIS — N186 End stage renal disease: Secondary | ICD-10-CM | POA: Diagnosis not present

## 2023-01-19 DIAGNOSIS — Z992 Dependence on renal dialysis: Secondary | ICD-10-CM | POA: Diagnosis not present

## 2023-01-19 DIAGNOSIS — N186 End stage renal disease: Secondary | ICD-10-CM | POA: Diagnosis not present

## 2023-01-20 DIAGNOSIS — Z992 Dependence on renal dialysis: Secondary | ICD-10-CM | POA: Diagnosis not present

## 2023-01-20 DIAGNOSIS — N186 End stage renal disease: Secondary | ICD-10-CM | POA: Diagnosis not present

## 2023-01-21 ENCOUNTER — Encounter (INDEPENDENT_AMBULATORY_CARE_PROVIDER_SITE_OTHER): Payer: Medicare Other | Admitting: Ophthalmology

## 2023-01-21 DIAGNOSIS — H35073 Retinal telangiectasis, bilateral: Secondary | ICD-10-CM

## 2023-01-21 DIAGNOSIS — H59033 Cystoid macular edema following cataract surgery, bilateral: Secondary | ICD-10-CM

## 2023-01-21 DIAGNOSIS — I10 Essential (primary) hypertension: Secondary | ICD-10-CM | POA: Diagnosis not present

## 2023-01-21 DIAGNOSIS — H43813 Vitreous degeneration, bilateral: Secondary | ICD-10-CM

## 2023-01-21 DIAGNOSIS — N186 End stage renal disease: Secondary | ICD-10-CM | POA: Diagnosis not present

## 2023-01-21 DIAGNOSIS — H35033 Hypertensive retinopathy, bilateral: Secondary | ICD-10-CM | POA: Diagnosis not present

## 2023-01-21 DIAGNOSIS — Z992 Dependence on renal dialysis: Secondary | ICD-10-CM | POA: Diagnosis not present

## 2023-01-22 ENCOUNTER — Other Ambulatory Visit: Payer: Self-pay | Admitting: Family Medicine

## 2023-01-22 DIAGNOSIS — Z992 Dependence on renal dialysis: Secondary | ICD-10-CM | POA: Diagnosis not present

## 2023-01-22 DIAGNOSIS — N186 End stage renal disease: Secondary | ICD-10-CM | POA: Diagnosis not present

## 2023-01-22 NOTE — Telephone Encounter (Signed)
Requested medication (s) are due for refill today - expired Rx  Requested medication (s) are on the active medication list -yes  Future visit scheduled -no  Last refill: 10/23/21 #90 3RF  Notes to clinic: expired Rx  Requested Prescriptions  Pending Prescriptions Disp Refills   levothyroxine (SYNTHROID) 50 MCG tablet [Pharmacy Med Name: LEVOTHYROXINE SODIUM 50 MCG TAB] 90 tablet 3    Sig: TAKE ONE TABLET ( TOTAL) BY MOUTH DAILY BEFORE BREAKFAST     Endocrinology:  Hypothyroid Agents Passed - 01/22/2023  3:36 PM      Passed - TSH in normal range and within 360 days    TSH  Date Value Ref Range Status  02/15/2022 2.08 0.40 - 4.50 mIU/L Final         Passed - Valid encounter within last 12 months    Recent Outpatient Visits           11 months ago Hypothyroidism, unspecified type   [facility] Pickard, Priscille Heidelberg, MD   1 year ago Chest pain due to GERD   [facility] Tanya Nones, Priscille Heidelberg, MD   2 years ago Essential hypertension   [facility] Tanya Nones, Priscille Heidelberg, MD   2 years ago End stage renal disease (HCC)   [facility] Donita Brooks, MD   2 years ago Depression, recurrent (HCC)   [facility] Pickard, Priscille Heidelberg, MD       Future Appointments             In 2 days Branch, Dorothe Pea, MD [facility] [place], [place]               Requested Prescriptions  Pending Prescriptions Disp Refills   levothyroxine (SYNTHROID) 50 MCG tablet [Pharmacy Med Name: LEVOTHYROXINE SODIUM 50 MCG TAB] 90 tablet 3    Sig: TAKE ONE TABLET ( TOTAL) BY MOUTH DAILY BEFORE BREAKFAST     Endocrinology:  Hypothyroid Agents Passed - 01/22/2023  3:36 PM      Passed - TSH in normal range and within 360 days    TSH  Date Value Ref Range Status  02/15/2022 2.08 0.40 - 4.50 mIU/L Final         Passed - Valid encounter within last 12 months    Recent Outpatient Visits           11  months ago Hypothyroidism, unspecified type   [facility] Pickard, Priscille Heidelberg, MD   1 year ago Chest pain due to GERD   [facility] Donita Brooks, MD   2 years ago Essential hypertension   [facility] Donita Brooks, MD   2 years ago End stage renal disease (HCC)   [facility] Donita Brooks, MD   2 years ago Depression, recurrent (HCC)   [facility] Pickard, Priscille Heidelberg, MD       Future Appointments             In 2 days Branch, Dorothe Pea, MD [facility] at [place], [place]

## 2023-01-23 DIAGNOSIS — Z992 Dependence on renal dialysis: Secondary | ICD-10-CM | POA: Diagnosis not present

## 2023-01-23 DIAGNOSIS — N186 End stage renal disease: Secondary | ICD-10-CM | POA: Diagnosis not present

## 2023-01-24 ENCOUNTER — Encounter: Payer: Self-pay | Admitting: Cardiology

## 2023-01-24 ENCOUNTER — Ambulatory Visit: Payer: Medicare Other | Attending: Cardiology | Admitting: Cardiology

## 2023-01-24 VITALS — BP 126/72 | HR 75 | Ht 68.0 in | Wt 151.4 lb

## 2023-01-24 DIAGNOSIS — I10 Essential (primary) hypertension: Secondary | ICD-10-CM

## 2023-01-24 DIAGNOSIS — Z79899 Other long term (current) drug therapy: Secondary | ICD-10-CM

## 2023-01-24 DIAGNOSIS — I5022 Chronic systolic (congestive) heart failure: Secondary | ICD-10-CM | POA: Diagnosis not present

## 2023-01-24 DIAGNOSIS — I2510 Atherosclerotic heart disease of native coronary artery without angina pectoris: Secondary | ICD-10-CM | POA: Diagnosis not present

## 2023-01-24 DIAGNOSIS — I6523 Occlusion and stenosis of bilateral carotid arteries: Secondary | ICD-10-CM

## 2023-01-24 DIAGNOSIS — N186 End stage renal disease: Secondary | ICD-10-CM | POA: Diagnosis not present

## 2023-01-24 DIAGNOSIS — M79606 Pain in leg, unspecified: Secondary | ICD-10-CM | POA: Diagnosis not present

## 2023-01-24 DIAGNOSIS — Z992 Dependence on renal dialysis: Secondary | ICD-10-CM | POA: Diagnosis not present

## 2023-01-24 MED ORDER — SPIRONOLACTONE 25 MG PO TABS: 12.5000 mg | ORAL_TABLET | Freq: Every day | ORAL | 6 refills | Status: DC

## 2023-01-24 NOTE — Addendum Note (Signed)
Addended by: Lesle Chris on: 01/24/2023 12:23 PM   Modules accepted: Orders

## 2023-01-24 NOTE — Progress Notes (Signed)
Clinical Summary Mr. Beehler is a 77 y.o.male seen today for follow up of the following medical problems.   1. CAD - multiple prior interventions with subsequent CABG in 11/2011 with LIMA-LAD, SVG-PDA, SVG-OM2, and SVG-D1   - denies chest pain, no SOB/DOE - compliant with meds     2. Chronic combine systolic/diastolic HF -10/2018 echo: LVEF 45% - 07/2020 echo LVEF 25% - 11/2020 echo LVEF 35-40%, grade I dd, mild RV dysfunction.   - not on beta blocker due to bradycardia     - last visit we started entresto 24/26mg  bid, later increased to 49/51mg  bid.  - no SOB/DOE, no recent edema - baseline weight is around 140 lbs.   - no recent edema -      3. PVCs Recent monitor last year showed an 18% PVC burden and he was ultimately started on Amiodarone  daily -no recent palpitations - from notes from [place] [facility] Hews amio stopped due to abnormal thyroid studies     4. Carotid stenosis 05/2020 RICA mild, left moderate disease 08/2022 RICA 1-39%, LICA 40-59%   5. 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 Leg pains - - has had some leg cramps with walking. Mainly right posterior thigh, left calf - symptoms at about a block - no sores on feet  7. HTN - home bp's 130s-140s/70s-80s   SH: enjoys working bees   Past Medical History:  Diagnosis Date   BPH (benign prostatic hyperplasia)    CAD S/P percutaneous coronary angioplasty    a. PTCA of OM 1988 & 1994 b. PCI with BMS to LAD in 1997 c. RCA PCI BMS 1999 & 2000 d. s/p CABG in 11/2011 with LIMA-LAD, SVG-PDA, SVG-OM2, and SVG-D1   Cataract    Chronic back pain    CKD (chronic kidney disease), stage III (HCC)    Cyst of bursa    R shoulder   Diabetic retinopathy (HCC)    DM (diabetes mellitus), type 2 with renal complications (HCC)    Essential hypertension    Frequent PVCs    GERD (gastroesophageal reflux disease)    Hypertensive retinopathy    Ischemic cardiomyopathy 11/2011   Intra-OP TEE: EF 40-45%, no regional  WMA; improved Anterior WM post CABG.   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 90 tablet 1   aspirin EC 81 MG tablet Take 81 mg by mouth daily.     brimonidine (ALPHAGAN) 0.2 % ophthalmic solution Place 1 drop into both eyes 2 (two) times daily. 10 mL 3   Cholecalciferol (VITAMIN D3) 25 MCG (1000 UT) CAPS Take 1 capsule (1,000 Units total) by mouth daily at 6 (six) AM. 60 capsule 6   doxazosin (CARDURA) 8 MG tablet Take 8 mg by mouth daily.     furosemide (LASIX) 20 MG tablet Take 20 mg by mouth daily.     hydrALAZINE (APRESOLINE) 50 MG tablet Take 50 mg by mouth 3 (three) times daily.     levothyroxine (SYNTHROID) 50 MCG tablet TAKE ONE TABLET ( TOTAL)  BY MOUTH DAILY BEFORE BREAKFAST 90 tablet 0   multivitamin (RENA-VIT) TABS tablet Take 1 tablet by mouth daily.     pantoprazole (PROTONIX) 40 MG tablet TAKE ONE TABLET BY MOUTH TWICE A DAY 180 tablet 3   pravastatin (PRAVACHOL) 20 MG tablet TAKE ONE TABLET (  TOTAL) BY MOUTH DAILY 90 tablet 3   sacubitril-valsartan (ENTRESTO) 97-103 MG TAKE ONE TABLET BY MOUTH TWICE A DAY 60 tablet 3   sildenafil (VIAGRA) 100 MG tablet Take 0.5-1 tablets (50-100 mg total) by mouth daily as needed for erectile dysfunction. 60 tablet 1   torsemide (DEMADEX) 20 MG tablet Take 20 mg by mouth daily as needed (swelling).     No current facility-administered medications for this visit.     Past Surgical History:  Procedure Laterality Date   AV FISTULA PLACEMENT Left 07/28/2020   Procedure: LEFT ARM ARTERIOVENOUS (AV) FISTULA  CREATION;  Surgeon: Larina Earthly, MD;  Location: [facility];  Service: Vascular;   Laterality: Left;   BACK SURGERY  10/01/1968   BIOPSY  01/25/2022   Procedure: BIOPSY;  Surgeon: Corbin Ade, MD;  Location: [facility];  Service: Endoscopy;;   CARDIAC CATHETERIZATION  10/02/2011   CATARACT EXTRACTION W/PHACO Left 09/14/2022   Procedure: CATARACT EXTRACTION PHACO AND INTRAOCULAR LENS PLACEMENT (IOC);  Surgeon: Fabio Pierce, MD;  Location: [facility];  Service: Ophthalmology;  Laterality: Left;  CDE: 8.66   CATARACT EXTRACTION W/PHACO Right 09/28/2022   Procedure: CATARACT EXTRACTION PHACO AND INTRAOCULAR LENS PLACEMENT (IOC);  Surgeon: Fabio Pierce, MD;  Location: [facility];  Service: Ophthalmology;  Laterality: Right;  CDE 7.79   COLONOSCOPY WITH PROPOFOL N/A 01/25/2022   Procedure: COLONOSCOPY WITH PROPOFOL;  Surgeon: Corbin Ade, MD;  Location: [facility];  Service: Endoscopy;  Laterality: N/A;  10:30am   CORONARY ANGIOPLASTY  10/01/1992   OM   CORONARY ANGIOPLASTY  10/02/1995   LAD   CORONARY ANGIOPLASTY WITH STENT PLACEMENT  10/01/1997   RCA   CORONARY ANGIOPLASTY WITH STENT PLACEMENT  10/02/1999   RCA   CORONARY ARTERY BYPASS GRAFT  12/27/2011   Procedure: CORONARY ARTERY BYPASS GRAFTING (CABG);  Surgeon: Purcell Nails, MD;  Location: [facility] OR;  Service: Open Heart Surgery;  Laterality: N/A;  Times four. On pump. Using endoscopically harvested right greater saphenous vein and left internal mammary artery.    ESOPHAGOGASTRODUODENOSCOPY (EGD) WITH PROPOFOL N/A 01/25/2022   Procedure: ESOPHAGOGASTRODUODENOSCOPY (EGD) WITH PROPOFOL;  Surgeon: Corbin Ade, MD;  [facility];  Service: Endoscopy;  Laterality: N/A;   HERNIA REPAIR Bilateral    INCISION AND DRAINAGE OF WOUND  10/01/2004   axilla   INSERTION OF DIALYSIS CATHETER Right 07/25/2020   Procedure: INSERTION OF DIALYSIS CATHETER;  Surgeon: Lucretia Roers, MD;  Location: [facility];  Service: General;  Laterality: Right;   INTRAOPERATIVE TRANSESOPHAGEAL ECHOCARDIOGRAM  12/27/2011   Global  hypokinesis with EF of 40-45%, improved LAD distribution wall motion.   LEFT HEART CATHETERIZATION WITH CORONARY ANGIOGRAM N/A 12/13/2011   Procedure: LEFT HEART CATHETERIZATION WITH CORONARY ANGIOGRAM;  Surgeon: Marykay Lex, MD;  Location: [facility] CATH LAB;  Service: Cardiovascular;  Laterality: N/A;   LUMBAR FUSION     MASS EXCISION  10/24/2011   R arm   POLYPECTOMY  01/25/2022   Procedure: POLYPECTOMY;  Surgeon: Corbin Ade, MD;  [facility];  Service: Endoscopy;;   RIGHT HEART CATH N/A 07/12/2020   Procedure: RIGHT HEART CATH;  Surgeon: Lyn Records, MD;  Location: [facility] CV LAB;  Service: Cardiovascular;  Laterality: N/A;   TRANSESOPHAGEAL ECHOCARDIOGRAM  10/02/2011     No Known Allergies    Family History  Problem Relation Age of Onset   Cancer Mother        breast   Cancer Father        bone   Khadir cancer Neg Hx      Social History Mr. Kimberlin reports that he quit smoking about 36 years ago. His smoking use included cigarettes. He has a 15.00 pack-year smoking history. He has never used smokeless tobacco. Mr. Woodrome reports no history of alcohol use.   Review of Systems CONSTITUTIONAL: No weight loss, fever, chills, weakness or fatigue.  HEENT: Eyes: No visual loss, blurred vision, double vision or yellow sclerae.No hearing loss, sneezing, congestion, runny nose or sore throat.  SKIN: No rash or itching.  CARDIOVASCULAR: per hpi RESPIRATORY: No shortness of breath, cough or sputum.  GASTROINTESTINAL: No anorexia, nausea, vomiting or diarrhea. No abdominal pain or blood.  GENITOURINARY: No burning on urination, no polyuria NEUROLOGICAL: No headache, dizziness, syncope, paralysis, ataxia, numbness or tingling in the extremities. No change in bowel or bladder control.  MUSCULOSKELETAL: per hpi LYMPHATICS: No enlarged nodes. No history of splenectomy.  PSYCHIATRIC: No history of depression or anxiety.  ENDOCRINOLOGIC: No reports of sweating, cold or  heat intolerance. No polyuria or polydipsia.  [name]   Physical Examination Today's Vitals   01/24/23 1129  BP: 126/72  Pulse: 75  SpO2: 97%  Weight: 151 lb 6.4 oz (68.7 kg)  Height: 5\' 8"  (1.727 m)   Body mass index is 23.02 kg/m.  Gen: resting comfortably, no acute distress HEENT: no scleral icterus, pupils equal round and reactive, no palptable cervical adenopathy,  CV: RRR, no mrg, no JVD. +bilatearal carotid bruits Resp: Clear to auscultation bilaterally GI: abdomen is soft, non-tender, non-distended, normal bowel sounds, no hepatosplenomegaly MSK: extremities are warm, no edema.  Skin: warm, no rash Neuro:  no focal deficits Psych: appropriate affect   Diagnostic Studies     Assessment and Plan  1. CAD -no recent symptoms, continue current meds   2. Chronic systolic HF - no symptoms - volume mainly managed with peritoneal dialysis though does make some urine.  - not on beta blocker due to prior bradycardia - SGLT2i contraindicated in ESRD - on entresto, will add aldactone 12.5mg  daily with bmet 2 weeks.     3. Carotid stenosis - mild to moderate disease - stable by recent US, continue to monitor   4. HTN - bp at goal here but home numbers elevated, adding aldactone as reported above.  - continue to monitor bp's  5.Hyperlipidemia - continue statin, labs per pcp  F/u 6 months  Antoine Poche, M.D.,

## 2023-01-24 NOTE — Patient Instructions (Addendum)
Medication Instructions:   Begin Spironolactone 12.5mg  daily  Continue all other medications.     Labwork:  BMET - order given  Please do in 2 weeks   Testing/Procedures:  Your physician has requested that you have an ankle brachial index (ABI). During this test an ultrasound and blood pressure cuff are used to evaluate the arteries that supply the arms and legs with blood. Allow thirty minutes for this exam. There are no restrictions or special instructions.   Follow-Up:  Office will contact with results via phone, letter or mychart.    6 months   Any Other Special Instructions Will Be Listed Below (If Applicable).   If you need a refill on your cardiac medications before your next appointment, please call your pharmacy.

## 2023-01-25 DIAGNOSIS — N186 End stage renal disease: Secondary | ICD-10-CM | POA: Diagnosis not present

## 2023-01-25 DIAGNOSIS — Z992 Dependence on renal dialysis: Secondary | ICD-10-CM | POA: Diagnosis not present

## 2023-01-26 DIAGNOSIS — N186 End stage renal disease: Secondary | ICD-10-CM | POA: Diagnosis not present

## 2023-01-26 DIAGNOSIS — Z992 Dependence on renal dialysis: Secondary | ICD-10-CM | POA: Diagnosis not present

## 2023-01-27 DIAGNOSIS — Z992 Dependence on renal dialysis: Secondary | ICD-10-CM | POA: Diagnosis not present

## 2023-01-27 DIAGNOSIS — N186 End stage renal disease: Secondary | ICD-10-CM | POA: Diagnosis not present

## 2023-01-28 ENCOUNTER — Ambulatory Visit (HOSPITAL_COMMUNITY)
Admission: RE | Admit: 2023-01-28 | Discharge: 2023-01-28 | Disposition: A | Discharge status: Home / Self Care | Payer: Medicare Other | Source: Ambulatory Visit | Attending: Cardiology | Admitting: Cardiology

## 2023-01-28 DIAGNOSIS — N186 End stage renal disease: Secondary | ICD-10-CM | POA: Diagnosis not present

## 2023-01-28 DIAGNOSIS — M79606 Pain in leg, unspecified: Secondary | ICD-10-CM | POA: Diagnosis not present

## 2023-01-28 DIAGNOSIS — I70213 Atherosclerosis of native arteries of extremities with intermittent claudication, bilateral legs: Secondary | ICD-10-CM | POA: Diagnosis not present

## 2023-01-28 DIAGNOSIS — Z992 Dependence on renal dialysis: Secondary | ICD-10-CM | POA: Diagnosis not present

## 2023-01-29 DIAGNOSIS — Z992 Dependence on renal dialysis: Secondary | ICD-10-CM | POA: Diagnosis not present

## 2023-01-29 DIAGNOSIS — N186 End stage renal disease: Secondary | ICD-10-CM | POA: Diagnosis not present

## 2023-01-30 DIAGNOSIS — Z992 Dependence on renal dialysis: Secondary | ICD-10-CM | POA: Diagnosis not present

## 2023-01-30 DIAGNOSIS — N186 End stage renal disease: Secondary | ICD-10-CM | POA: Diagnosis not present

## 2023-01-31 DIAGNOSIS — N186 End stage renal disease: Secondary | ICD-10-CM | POA: Diagnosis not present

## 2023-01-31 DIAGNOSIS — Z992 Dependence on renal dialysis: Secondary | ICD-10-CM | POA: Diagnosis not present

## 2023-02-01 ENCOUNTER — Telehealth: Payer: Self-pay | Admitting: *Deleted

## 2023-02-01 DIAGNOSIS — Z992 Dependence on renal dialysis: Secondary | ICD-10-CM | POA: Diagnosis not present

## 2023-02-01 DIAGNOSIS — N186 End stage renal disease: Secondary | ICD-10-CM | POA: Diagnosis not present

## 2023-02-01 NOTE — Telephone Encounter (Signed)
Lesle Chris, LPN 10/06/1094 04:54 AM EDT Back to Top    Notified, copy to pcp.

## 2023-02-01 NOTE — Telephone Encounter (Signed)
-----   Message from Antoine Poche, MD sent at 01/31/2023 12:27 PM EDT ----- Normal circulation in legs

## 2023-02-02 DIAGNOSIS — N186 End stage renal disease: Secondary | ICD-10-CM | POA: Diagnosis not present

## 2023-02-02 DIAGNOSIS — Z992 Dependence on renal dialysis: Secondary | ICD-10-CM | POA: Diagnosis not present

## 2023-02-03 DIAGNOSIS — Z992 Dependence on renal dialysis: Secondary | ICD-10-CM | POA: Diagnosis not present

## 2023-02-03 DIAGNOSIS — N186 End stage renal disease: Secondary | ICD-10-CM | POA: Diagnosis not present

## 2023-02-04 DIAGNOSIS — N186 End stage renal disease: Secondary | ICD-10-CM | POA: Diagnosis not present

## 2023-02-04 DIAGNOSIS — Z992 Dependence on renal dialysis: Secondary | ICD-10-CM | POA: Diagnosis not present

## 2023-02-05 DIAGNOSIS — Z992 Dependence on renal dialysis: Secondary | ICD-10-CM | POA: Diagnosis not present

## 2023-02-05 DIAGNOSIS — N186 End stage renal disease: Secondary | ICD-10-CM | POA: Diagnosis not present

## 2023-02-06 DIAGNOSIS — Z992 Dependence on renal dialysis: Secondary | ICD-10-CM | POA: Diagnosis not present

## 2023-02-06 DIAGNOSIS — N186 End stage renal disease: Secondary | ICD-10-CM | POA: Diagnosis not present

## 2023-02-07 DIAGNOSIS — N186 End stage renal disease: Secondary | ICD-10-CM | POA: Diagnosis not present

## 2023-02-07 DIAGNOSIS — Z992 Dependence on renal dialysis: Secondary | ICD-10-CM | POA: Diagnosis not present

## 2023-02-08 DIAGNOSIS — N186 End stage renal disease: Secondary | ICD-10-CM | POA: Diagnosis not present

## 2023-02-08 DIAGNOSIS — Z992 Dependence on renal dialysis: Secondary | ICD-10-CM | POA: Diagnosis not present

## 2023-02-09 DIAGNOSIS — N186 End stage renal disease: Secondary | ICD-10-CM | POA: Diagnosis not present

## 2023-02-09 DIAGNOSIS — Z992 Dependence on renal dialysis: Secondary | ICD-10-CM | POA: Diagnosis not present

## 2023-02-10 DIAGNOSIS — Z992 Dependence on renal dialysis: Secondary | ICD-10-CM | POA: Diagnosis not present

## 2023-02-10 DIAGNOSIS — N186 End stage renal disease: Secondary | ICD-10-CM | POA: Diagnosis not present

## 2023-02-11 DIAGNOSIS — N186 End stage renal disease: Secondary | ICD-10-CM | POA: Diagnosis not present

## 2023-02-11 DIAGNOSIS — Z992 Dependence on renal dialysis: Secondary | ICD-10-CM | POA: Diagnosis not present

## 2023-02-12 DIAGNOSIS — N186 End stage renal disease: Secondary | ICD-10-CM | POA: Diagnosis not present

## 2023-02-12 DIAGNOSIS — Z992 Dependence on renal dialysis: Secondary | ICD-10-CM | POA: Diagnosis not present

## 2023-02-13 DIAGNOSIS — Z992 Dependence on renal dialysis: Secondary | ICD-10-CM | POA: Diagnosis not present

## 2023-02-13 DIAGNOSIS — N186 End stage renal disease: Secondary | ICD-10-CM | POA: Diagnosis not present

## 2023-02-14 DIAGNOSIS — Z992 Dependence on renal dialysis: Secondary | ICD-10-CM | POA: Diagnosis not present

## 2023-02-14 DIAGNOSIS — N186 End stage renal disease: Secondary | ICD-10-CM | POA: Diagnosis not present

## 2023-02-15 DIAGNOSIS — N186 End stage renal disease: Secondary | ICD-10-CM | POA: Diagnosis not present

## 2023-02-15 DIAGNOSIS — Z992 Dependence on renal dialysis: Secondary | ICD-10-CM | POA: Diagnosis not present

## 2023-02-16 DIAGNOSIS — Z992 Dependence on renal dialysis: Secondary | ICD-10-CM | POA: Diagnosis not present

## 2023-02-16 DIAGNOSIS — N186 End stage renal disease: Secondary | ICD-10-CM | POA: Diagnosis not present

## 2023-02-17 DIAGNOSIS — N186 End stage renal disease: Secondary | ICD-10-CM | POA: Diagnosis not present

## 2023-02-17 DIAGNOSIS — Z992 Dependence on renal dialysis: Secondary | ICD-10-CM | POA: Diagnosis not present

## 2023-02-18 DIAGNOSIS — N186 End stage renal disease: Secondary | ICD-10-CM | POA: Diagnosis not present

## 2023-02-18 DIAGNOSIS — Z992 Dependence on renal dialysis: Secondary | ICD-10-CM | POA: Diagnosis not present

## 2023-02-19 DIAGNOSIS — Z992 Dependence on renal dialysis: Secondary | ICD-10-CM | POA: Diagnosis not present

## 2023-02-19 DIAGNOSIS — N186 End stage renal disease: Secondary | ICD-10-CM | POA: Diagnosis not present

## 2023-02-20 DIAGNOSIS — N186 End stage renal disease: Secondary | ICD-10-CM | POA: Diagnosis not present

## 2023-02-20 DIAGNOSIS — Z992 Dependence on renal dialysis: Secondary | ICD-10-CM | POA: Diagnosis not present

## 2023-02-21 ENCOUNTER — Other Ambulatory Visit: Payer: Self-pay | Admitting: Cardiology

## 2023-02-21 DIAGNOSIS — N186 End stage renal disease: Secondary | ICD-10-CM | POA: Diagnosis not present

## 2023-02-21 DIAGNOSIS — Z992 Dependence on renal dialysis: Secondary | ICD-10-CM | POA: Diagnosis not present

## 2023-02-22 DIAGNOSIS — N186 End stage renal disease: Secondary | ICD-10-CM | POA: Diagnosis not present

## 2023-02-22 DIAGNOSIS — Z992 Dependence on renal dialysis: Secondary | ICD-10-CM | POA: Diagnosis not present

## 2023-02-23 DIAGNOSIS — Z992 Dependence on renal dialysis: Secondary | ICD-10-CM | POA: Diagnosis not present

## 2023-02-23 DIAGNOSIS — N186 End stage renal disease: Secondary | ICD-10-CM | POA: Diagnosis not present

## 2023-02-24 DIAGNOSIS — N186 End stage renal disease: Secondary | ICD-10-CM | POA: Diagnosis not present

## 2023-02-24 DIAGNOSIS — Z992 Dependence on renal dialysis: Secondary | ICD-10-CM | POA: Diagnosis not present

## 2023-02-25 DIAGNOSIS — N186 End stage renal disease: Secondary | ICD-10-CM | POA: Diagnosis not present

## 2023-02-25 DIAGNOSIS — Z992 Dependence on renal dialysis: Secondary | ICD-10-CM | POA: Diagnosis not present

## 2023-02-26 DIAGNOSIS — Z992 Dependence on renal dialysis: Secondary | ICD-10-CM | POA: Diagnosis not present

## 2023-02-26 DIAGNOSIS — N186 End stage renal disease: Secondary | ICD-10-CM | POA: Diagnosis not present

## 2023-02-27 DIAGNOSIS — N186 End stage renal disease: Secondary | ICD-10-CM | POA: Diagnosis not present

## 2023-02-27 DIAGNOSIS — Z992 Dependence on renal dialysis: Secondary | ICD-10-CM | POA: Diagnosis not present

## 2023-02-28 DIAGNOSIS — N186 End stage renal disease: Secondary | ICD-10-CM | POA: Diagnosis not present

## 2023-02-28 DIAGNOSIS — Z992 Dependence on renal dialysis: Secondary | ICD-10-CM | POA: Diagnosis not present

## 2023-03-01 DIAGNOSIS — Z992 Dependence on renal dialysis: Secondary | ICD-10-CM | POA: Diagnosis not present

## 2023-03-01 DIAGNOSIS — N186 End stage renal disease: Secondary | ICD-10-CM | POA: Diagnosis not present

## 2023-03-02 DIAGNOSIS — N186 End stage renal disease: Secondary | ICD-10-CM | POA: Diagnosis not present

## 2023-03-02 DIAGNOSIS — Z992 Dependence on renal dialysis: Secondary | ICD-10-CM | POA: Diagnosis not present

## 2023-03-03 DIAGNOSIS — N186 End stage renal disease: Secondary | ICD-10-CM | POA: Diagnosis not present

## 2023-03-03 DIAGNOSIS — Z992 Dependence on renal dialysis: Secondary | ICD-10-CM | POA: Diagnosis not present

## 2023-03-04 DIAGNOSIS — N186 End stage renal disease: Secondary | ICD-10-CM | POA: Diagnosis not present

## 2023-03-04 DIAGNOSIS — Z992 Dependence on renal dialysis: Secondary | ICD-10-CM | POA: Diagnosis not present

## 2023-03-05 DIAGNOSIS — N186 End stage renal disease: Secondary | ICD-10-CM | POA: Diagnosis not present

## 2023-03-05 DIAGNOSIS — Z992 Dependence on renal dialysis: Secondary | ICD-10-CM | POA: Diagnosis not present

## 2023-03-06 DIAGNOSIS — N186 End stage renal disease: Secondary | ICD-10-CM | POA: Diagnosis not present

## 2023-03-06 DIAGNOSIS — Z992 Dependence on renal dialysis: Secondary | ICD-10-CM | POA: Diagnosis not present

## 2023-03-07 DIAGNOSIS — Z992 Dependence on renal dialysis: Secondary | ICD-10-CM | POA: Diagnosis not present

## 2023-03-07 DIAGNOSIS — N186 End stage renal disease: Secondary | ICD-10-CM | POA: Diagnosis not present

## 2023-03-08 DIAGNOSIS — N186 End stage renal disease: Secondary | ICD-10-CM | POA: Diagnosis not present

## 2023-03-08 DIAGNOSIS — Z992 Dependence on renal dialysis: Secondary | ICD-10-CM | POA: Diagnosis not present

## 2023-03-09 DIAGNOSIS — Z992 Dependence on renal dialysis: Secondary | ICD-10-CM | POA: Diagnosis not present

## 2023-03-09 DIAGNOSIS — N186 End stage renal disease: Secondary | ICD-10-CM | POA: Diagnosis not present

## 2023-03-10 DIAGNOSIS — Z992 Dependence on renal dialysis: Secondary | ICD-10-CM | POA: Diagnosis not present

## 2023-03-10 DIAGNOSIS — N186 End stage renal disease: Secondary | ICD-10-CM | POA: Diagnosis not present

## 2023-03-11 DIAGNOSIS — N186 End stage renal disease: Secondary | ICD-10-CM | POA: Diagnosis not present

## 2023-03-11 DIAGNOSIS — Z992 Dependence on renal dialysis: Secondary | ICD-10-CM | POA: Diagnosis not present

## 2023-03-12 DIAGNOSIS — Z992 Dependence on renal dialysis: Secondary | ICD-10-CM | POA: Diagnosis not present

## 2023-03-12 DIAGNOSIS — N186 End stage renal disease: Secondary | ICD-10-CM | POA: Diagnosis not present

## 2023-03-13 DIAGNOSIS — N186 End stage renal disease: Secondary | ICD-10-CM | POA: Diagnosis not present

## 2023-03-13 DIAGNOSIS — Z992 Dependence on renal dialysis: Secondary | ICD-10-CM | POA: Diagnosis not present

## 2023-03-14 DIAGNOSIS — Z992 Dependence on renal dialysis: Secondary | ICD-10-CM | POA: Diagnosis not present

## 2023-03-14 DIAGNOSIS — N186 End stage renal disease: Secondary | ICD-10-CM | POA: Diagnosis not present

## 2023-03-15 DIAGNOSIS — N186 End stage renal disease: Secondary | ICD-10-CM | POA: Diagnosis not present

## 2023-03-15 DIAGNOSIS — Z992 Dependence on renal dialysis: Secondary | ICD-10-CM | POA: Diagnosis not present

## 2023-03-16 DIAGNOSIS — Z992 Dependence on renal dialysis: Secondary | ICD-10-CM | POA: Diagnosis not present

## 2023-03-16 DIAGNOSIS — N186 End stage renal disease: Secondary | ICD-10-CM | POA: Diagnosis not present

## 2023-03-17 DIAGNOSIS — Z992 Dependence on renal dialysis: Secondary | ICD-10-CM | POA: Diagnosis not present

## 2023-03-17 DIAGNOSIS — N186 End stage renal disease: Secondary | ICD-10-CM | POA: Diagnosis not present

## 2023-03-18 ENCOUNTER — Encounter (INDEPENDENT_AMBULATORY_CARE_PROVIDER_SITE_OTHER): Payer: Medicare Other | Admitting: Ophthalmology

## 2023-03-18 DIAGNOSIS — I10 Essential (primary) hypertension: Secondary | ICD-10-CM | POA: Diagnosis not present

## 2023-03-18 DIAGNOSIS — N186 End stage renal disease: Secondary | ICD-10-CM | POA: Diagnosis not present

## 2023-03-18 DIAGNOSIS — H35033 Hypertensive retinopathy, bilateral: Secondary | ICD-10-CM | POA: Diagnosis not present

## 2023-03-18 DIAGNOSIS — H35371 Puckering of macula, right eye: Secondary | ICD-10-CM | POA: Diagnosis not present

## 2023-03-18 DIAGNOSIS — H43813 Vitreous degeneration, bilateral: Secondary | ICD-10-CM | POA: Diagnosis not present

## 2023-03-18 DIAGNOSIS — Z992 Dependence on renal dialysis: Secondary | ICD-10-CM | POA: Diagnosis not present

## 2023-03-18 DIAGNOSIS — H59033 Cystoid macular edema following cataract surgery, bilateral: Secondary | ICD-10-CM | POA: Diagnosis not present

## 2023-03-19 DIAGNOSIS — Z992 Dependence on renal dialysis: Secondary | ICD-10-CM | POA: Diagnosis not present

## 2023-03-19 DIAGNOSIS — N186 End stage renal disease: Secondary | ICD-10-CM | POA: Diagnosis not present

## 2023-03-20 DIAGNOSIS — N186 End stage renal disease: Secondary | ICD-10-CM | POA: Diagnosis not present

## 2023-03-20 DIAGNOSIS — Z992 Dependence on renal dialysis: Secondary | ICD-10-CM | POA: Diagnosis not present

## 2023-03-21 DIAGNOSIS — Z992 Dependence on renal dialysis: Secondary | ICD-10-CM | POA: Diagnosis not present

## 2023-03-21 DIAGNOSIS — N186 End stage renal disease: Secondary | ICD-10-CM | POA: Diagnosis not present

## 2023-03-22 DIAGNOSIS — N186 End stage renal disease: Secondary | ICD-10-CM | POA: Diagnosis not present

## 2023-03-22 DIAGNOSIS — Z992 Dependence on renal dialysis: Secondary | ICD-10-CM | POA: Diagnosis not present

## 2023-03-23 DIAGNOSIS — N186 End stage renal disease: Secondary | ICD-10-CM | POA: Diagnosis not present

## 2023-03-23 DIAGNOSIS — Z992 Dependence on renal dialysis: Secondary | ICD-10-CM | POA: Diagnosis not present

## 2023-03-24 DIAGNOSIS — Z992 Dependence on renal dialysis: Secondary | ICD-10-CM | POA: Diagnosis not present

## 2023-03-24 DIAGNOSIS — N186 End stage renal disease: Secondary | ICD-10-CM | POA: Diagnosis not present

## 2023-03-25 DIAGNOSIS — Z992 Dependence on renal dialysis: Secondary | ICD-10-CM | POA: Diagnosis not present

## 2023-03-25 DIAGNOSIS — N186 End stage renal disease: Secondary | ICD-10-CM | POA: Diagnosis not present

## 2023-03-26 DIAGNOSIS — Z992 Dependence on renal dialysis: Secondary | ICD-10-CM | POA: Diagnosis not present

## 2023-03-26 DIAGNOSIS — N186 End stage renal disease: Secondary | ICD-10-CM | POA: Diagnosis not present

## 2023-03-27 DIAGNOSIS — N186 End stage renal disease: Secondary | ICD-10-CM | POA: Diagnosis not present

## 2023-03-27 DIAGNOSIS — Z992 Dependence on renal dialysis: Secondary | ICD-10-CM | POA: Diagnosis not present

## 2023-03-28 DIAGNOSIS — N186 End stage renal disease: Secondary | ICD-10-CM | POA: Diagnosis not present

## 2023-03-28 DIAGNOSIS — Z992 Dependence on renal dialysis: Secondary | ICD-10-CM | POA: Diagnosis not present

## 2023-03-29 DIAGNOSIS — N186 End stage renal disease: Secondary | ICD-10-CM | POA: Diagnosis not present

## 2023-03-29 DIAGNOSIS — Z992 Dependence on renal dialysis: Secondary | ICD-10-CM | POA: Diagnosis not present

## 2023-03-30 ENCOUNTER — Other Ambulatory Visit: Payer: Self-pay | Admitting: Family Medicine

## 2023-03-30 DIAGNOSIS — N186 End stage renal disease: Secondary | ICD-10-CM | POA: Diagnosis not present

## 2023-03-30 DIAGNOSIS — Z992 Dependence on renal dialysis: Secondary | ICD-10-CM | POA: Diagnosis not present

## 2023-03-31 DIAGNOSIS — N186 End stage renal disease: Secondary | ICD-10-CM | POA: Diagnosis not present

## 2023-03-31 DIAGNOSIS — Z992 Dependence on renal dialysis: Secondary | ICD-10-CM | POA: Diagnosis not present

## 2023-04-01 DIAGNOSIS — N186 End stage renal disease: Secondary | ICD-10-CM | POA: Diagnosis not present

## 2023-04-01 DIAGNOSIS — Z992 Dependence on renal dialysis: Secondary | ICD-10-CM | POA: Diagnosis not present

## 2023-04-01 NOTE — Telephone Encounter (Signed)
Requested medications are due for refill today.  yes  Requested medications are on the active medications list.  yes  Last refill. 01/23/2022 #180 3rf  Future visit scheduled.   no  Notes to clinic.  Pt is due for CPE.    Requested Prescriptions  Pending Prescriptions Disp Refills   pantoprazole (PROTONIX) 40 MG tablet [Pharmacy Med Name: PANTOPRAZOLE SODIUM 40 MG DR TAB] 180 tablet 3    Sig: TAKE ONE TABLET BY MOUTH TWICE A DAY     Gastroenterology: Proton Pump Inhibitors Failed - 03/30/2023  9:30 AM      Failed - Valid encounter within last 12 months    Recent Outpatient Visits           1 year ago Hypothyroidism, unspecified type   [facility] Donita Brooks, MD   1 year ago Chest pain due to GERD   [facility] Donita Brooks, MD   2 years ago Essential hypertension   [facility] Donita Brooks, MD   2 years ago End stage renal disease (HCC)   [facility] Donita Brooks, MD   2 years ago Depression, recurrent (HCC)   [facility] Pickard, Priscille Heidelberg, MD       Future Appointments             In 4 months Branch, Dorothe Pea, MD [facility] at [place], [place]

## 2023-04-02 DIAGNOSIS — Z992 Dependence on renal dialysis: Secondary | ICD-10-CM | POA: Diagnosis not present

## 2023-04-02 DIAGNOSIS — N186 End stage renal disease: Secondary | ICD-10-CM | POA: Diagnosis not present

## 2023-04-03 DIAGNOSIS — Z992 Dependence on renal dialysis: Secondary | ICD-10-CM | POA: Diagnosis not present

## 2023-04-03 DIAGNOSIS — N186 End stage renal disease: Secondary | ICD-10-CM | POA: Diagnosis not present

## 2023-04-04 DIAGNOSIS — N186 End stage renal disease: Secondary | ICD-10-CM | POA: Diagnosis not present

## 2023-04-04 DIAGNOSIS — Z992 Dependence on renal dialysis: Secondary | ICD-10-CM | POA: Diagnosis not present

## 2023-04-05 DIAGNOSIS — Z992 Dependence on renal dialysis: Secondary | ICD-10-CM | POA: Diagnosis not present

## 2023-04-05 DIAGNOSIS — N186 End stage renal disease: Secondary | ICD-10-CM | POA: Diagnosis not present

## 2023-04-06 DIAGNOSIS — Z992 Dependence on renal dialysis: Secondary | ICD-10-CM | POA: Diagnosis not present

## 2023-04-06 DIAGNOSIS — N186 End stage renal disease: Secondary | ICD-10-CM | POA: Diagnosis not present

## 2023-04-07 DIAGNOSIS — Z992 Dependence on renal dialysis: Secondary | ICD-10-CM | POA: Diagnosis not present

## 2023-04-07 DIAGNOSIS — N186 End stage renal disease: Secondary | ICD-10-CM | POA: Diagnosis not present

## 2023-04-08 DIAGNOSIS — Z992 Dependence on renal dialysis: Secondary | ICD-10-CM | POA: Diagnosis not present

## 2023-04-08 DIAGNOSIS — N186 End stage renal disease: Secondary | ICD-10-CM | POA: Diagnosis not present

## 2023-04-09 DIAGNOSIS — Z992 Dependence on renal dialysis: Secondary | ICD-10-CM | POA: Diagnosis not present

## 2023-04-09 DIAGNOSIS — N186 End stage renal disease: Secondary | ICD-10-CM | POA: Diagnosis not present

## 2023-04-10 DIAGNOSIS — Z992 Dependence on renal dialysis: Secondary | ICD-10-CM | POA: Diagnosis not present

## 2023-04-10 DIAGNOSIS — N186 End stage renal disease: Secondary | ICD-10-CM | POA: Diagnosis not present

## 2023-04-11 DIAGNOSIS — Z992 Dependence on renal dialysis: Secondary | ICD-10-CM | POA: Diagnosis not present

## 2023-04-11 DIAGNOSIS — N186 End stage renal disease: Secondary | ICD-10-CM | POA: Diagnosis not present

## 2023-04-12 DIAGNOSIS — Z992 Dependence on renal dialysis: Secondary | ICD-10-CM | POA: Diagnosis not present

## 2023-04-12 DIAGNOSIS — N186 End stage renal disease: Secondary | ICD-10-CM | POA: Diagnosis not present

## 2023-04-13 DIAGNOSIS — N186 End stage renal disease: Secondary | ICD-10-CM | POA: Diagnosis not present

## 2023-04-13 DIAGNOSIS — Z992 Dependence on renal dialysis: Secondary | ICD-10-CM | POA: Diagnosis not present

## 2023-04-14 DIAGNOSIS — Z992 Dependence on renal dialysis: Secondary | ICD-10-CM | POA: Diagnosis not present

## 2023-04-14 DIAGNOSIS — N186 End stage renal disease: Secondary | ICD-10-CM | POA: Diagnosis not present

## 2023-04-15 DIAGNOSIS — Z992 Dependence on renal dialysis: Secondary | ICD-10-CM | POA: Diagnosis not present

## 2023-04-15 DIAGNOSIS — N186 End stage renal disease: Secondary | ICD-10-CM | POA: Diagnosis not present

## 2023-04-16 DIAGNOSIS — Z992 Dependence on renal dialysis: Secondary | ICD-10-CM | POA: Diagnosis not present

## 2023-04-16 DIAGNOSIS — N186 End stage renal disease: Secondary | ICD-10-CM | POA: Diagnosis not present

## 2023-04-17 DIAGNOSIS — N186 End stage renal disease: Secondary | ICD-10-CM | POA: Diagnosis not present

## 2023-04-17 DIAGNOSIS — Z992 Dependence on renal dialysis: Secondary | ICD-10-CM | POA: Diagnosis not present

## 2023-04-18 DIAGNOSIS — N186 End stage renal disease: Secondary | ICD-10-CM | POA: Diagnosis not present

## 2023-04-18 DIAGNOSIS — Z992 Dependence on renal dialysis: Secondary | ICD-10-CM | POA: Diagnosis not present

## 2023-04-19 ENCOUNTER — Other Ambulatory Visit: Payer: Self-pay | Admitting: Family Medicine

## 2023-04-19 DIAGNOSIS — N186 End stage renal disease: Secondary | ICD-10-CM | POA: Diagnosis not present

## 2023-04-19 DIAGNOSIS — Z992 Dependence on renal dialysis: Secondary | ICD-10-CM | POA: Diagnosis not present

## 2023-04-19 NOTE — Telephone Encounter (Signed)
Requested medications are due for refill today.  yes  Requested medications are on the active medications list.  yes  Last refill. 01/22/2023 #90 0 rf  Future visit scheduled.   no  Notes to clinic.  Labs are expired. Pt needs a CPE.    Requested Prescriptions  Pending Prescriptions Disp Refills   levothyroxine (SYNTHROID) 50 MCG tablet [Pharmacy Med Name: LEVOTHYROXINE SODIUM 50 MCG TAB] 90 tablet 0    Sig: TAKE ONE TABLET ( TOTAL) BY MOUTH DAILY BEFORE BREAKFAST     Endocrinology:  Hypothyroid Agents Failed - 04/19/2023  9:24 AM      Failed - TSH in normal range and within 360 days    TSH  Date Value Ref Range Status  02/15/2022 2.08 0.40 - 4.50 mIU/L Final         Failed - Valid encounter within last 12 months    Recent Outpatient Visits           1 year ago Hypothyroidism, unspecified type   [facility] Donita Brooks, MD   1 year ago Chest pain due to GERD   [facility] Donita Brooks, MD   2 years ago Essential hypertension   [facility] Donita Brooks, MD   2 years ago End stage renal disease (HCC)   [facility] Donita Brooks, MD   2 years ago Depression, recurrent (HCC)   [facility] Pickard, Priscille Heidelberg, MD       Future Appointments             In 3 months Branch, Dorothe Pea, MD [facility] at [place], [place]

## 2023-04-20 DIAGNOSIS — N186 End stage renal disease: Secondary | ICD-10-CM | POA: Diagnosis not present

## 2023-04-20 DIAGNOSIS — Z992 Dependence on renal dialysis: Secondary | ICD-10-CM | POA: Diagnosis not present

## 2023-04-21 DIAGNOSIS — Z992 Dependence on renal dialysis: Secondary | ICD-10-CM | POA: Diagnosis not present

## 2023-04-21 DIAGNOSIS — N186 End stage renal disease: Secondary | ICD-10-CM | POA: Diagnosis not present

## 2023-04-22 ENCOUNTER — Encounter: Payer: Self-pay | Admitting: Family Medicine

## 2023-04-22 ENCOUNTER — Ambulatory Visit (INDEPENDENT_AMBULATORY_CARE_PROVIDER_SITE_OTHER): Payer: Medicare Other | Admitting: Family Medicine

## 2023-04-22 VITALS — BP 150/60 | HR 53 | Temp 98.2°F | Ht 68.0 in | Wt 146.0 lb

## 2023-04-22 DIAGNOSIS — K118 Other diseases of salivary glands: Secondary | ICD-10-CM

## 2023-04-22 DIAGNOSIS — Z992 Dependence on renal dialysis: Secondary | ICD-10-CM | POA: Diagnosis not present

## 2023-04-22 DIAGNOSIS — E039 Hypothyroidism, unspecified: Secondary | ICD-10-CM | POA: Diagnosis not present

## 2023-04-22 DIAGNOSIS — N186 End stage renal disease: Secondary | ICD-10-CM | POA: Diagnosis not present

## 2023-04-22 MED ORDER — LEVOTHYROXINE SODIUM 50 MCG PO TABS: ORAL_TABLET | ORAL | 0 refills | Status: DC

## 2023-04-22 NOTE — Progress Notes (Signed)
Subjective:  HPI: Bradley Black is a 77 y.o. male presenting on 04/22/2023 for Follow-up (med refills, cyst/growth on right side of face. painful, swollen. Surfaced as small bump several months ago; recently enlargeed; pain began over past few days - JBG\\\)   HPI Patient is in today for a cyst on his right cheek. This has been present for several months and has increased in size and become painful. He denies redness, drainage, fever, chills, body aches.  Review of Systems  All other systems reviewed and are negative.   Relevant past medical history reviewed and updated as indicated.   Past Medical History:  Diagnosis Date   BPH (benign prostatic hyperplasia)    CAD S/P percutaneous coronary angioplasty    a. PTCA of OM 1988 & 1994 b. PCI with BMS to LAD in 1997 c. RCA PCI BMS 1999 & 2000 d. s/p CABG in 11/2011 with LIMA-LAD, SVG-PDA, SVG-OM2, and SVG-D1   Cataract    Chronic back pain    CKD (chronic kidney disease), stage III (HCC)    Cyst of bursa    R shoulder   Diabetic retinopathy (HCC)    DM (diabetes mellitus), type 2 with renal complications (HCC)    Essential hypertension    Frequent PVCs    GERD (gastroesophageal reflux disease)    Hypertensive retinopathy    Ischemic cardiomyopathy 11/2011   Intra-OP TEE: EF 40-45%, no regional WMA; improved Anterior WM post CABG.   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   LIMA to LAD, SVG to D1, SVG to OM2, SVG to PDA, EVH via right thigh and leg     Past Surgical History:  Procedure Laterality Date   AV FISTULA PLACEMENT Left 07/28/2020   Procedure: LEFT ARM ARTERIOVENOUS (AV) FISTULA  CREATION;  Surgeon: Larina Earthly, MD;  Location: [facility];  Service: Vascular;  Laterality: Left;   BACK SURGERY  10/01/1968   BIOPSY  01/25/2022   Procedure: BIOPSY;  Surgeon: Corbin Ade, MD;  Location: [facility];  Service:  Endoscopy;;   CARDIAC CATHETERIZATION  10/02/2011   CATARACT EXTRACTION W/PHACO Left 09/14/2022   Procedure: CATARACT EXTRACTION PHACO AND INTRAOCULAR LENS PLACEMENT (IOC);  Surgeon: Fabio Pierce, MD;  Location: [facility];  Service: Ophthalmology;  Laterality: Left;  CDE: 8.66   CATARACT EXTRACTION W/PHACO Right 09/28/2022   Procedure: CATARACT EXTRACTION PHACO AND INTRAOCULAR LENS PLACEMENT (IOC);  Surgeon: Fabio Pierce, MD;  Location: [facility];  Service: Ophthalmology;  Laterality: Right;  CDE 7.79   COLONOSCOPY WITH PROPOFOL N/A 01/25/2022   Procedure: COLONOSCOPY WITH PROPOFOL;  Surgeon: Corbin Ade, MD;  Location: [facility];  Service: Endoscopy;  Laterality: N/A;  10:30am   CORONARY ANGIOPLASTY  10/01/1992   OM   CORONARY ANGIOPLASTY  10/02/1995   LAD   CORONARY ANGIOPLASTY WITH STENT PLACEMENT  10/01/1997   RCA   CORONARY ANGIOPLASTY WITH STENT PLACEMENT  10/02/1999   RCA   CORONARY ARTERY BYPASS GRAFT  12/27/2011   Procedure: CORONARY ARTERY BYPASS GRAFTING (CABG);  Surgeon: Purcell Nails, MD;  Location[facility] OR;  Service: Open Heart Surgery;  Laterality: N/A;  Times four. On pump. Using endoscopically harvested right greater saphenous vein and left internal mammary artery.    ESOPHAGOGASTRODUODENOSCOPY (EGD) WITH PROPOFOL N/A 01/25/2022   Procedure: ESOPHAGOGASTRODUODENOSCOPY (EGD) WITH PROPOFOL;  Surgeon: Eula Listen  M, MD;  [facility]: [facility];  Service: Endoscopy;  Laterality: N/A;   HERNIA REPAIR Bilateral    INCISION AND DRAINAGE OF WOUND  10/01/2004   axilla   INSERTION OF DIALYSIS CATHETER Right 07/25/2020   Procedure: INSERTION OF DIALYSIS CATHETER;  Surgeon: Lucretia Roers, MD;  Location: [facility];  Service: General;  Laterality: Right;   INTRAOPERATIVE TRANSESOPHAGEAL ECHOCARDIOGRAM  12/27/2011   Global hypokinesis with EF of 40-45%, improved LAD distribution wall motion.   LEFT HEART CATHETERIZATION WITH CORONARY ANGIOGRAM N/A 12/13/2011   Procedure: LEFT  HEART CATHETERIZATION WITH CORONARY ANGIOGRAM;  Surgeon: Marykay Lex, MD;  Location: [facility] CATH LAB;  Service: Cardiovascular;  Laterality: N/A;   LUMBAR FUSION     MASS EXCISION  10/24/2011   R arm   POLYPECTOMY  01/25/2022   Procedure: POLYPECTOMY;  Surgeon: Corbin Ade, MD;  [facility];  Service: Endoscopy;;   RIGHT HEART CATH N/A 07/12/2020   Procedure: RIGHT HEART CATH;  Surgeon: Lyn Records, MD;  Location: [facility] INVASIVE CV LAB;  Service: Cardiovascular;  Laterality: N/A;   TRANSESOPHAGEAL ECHOCARDIOGRAM  10/02/2011    Allergies and medications reviewed and updated.   Current Outpatient Medications:    amLODipine (NORVASC) 5 MG tablet, Take 1 tablet (5 mg total) by mouth daily., Disp: 90 tablet, Rfl: 1   aspirin EC 81 MG tablet, Take 81 mg by mouth daily., Disp: , Rfl:    brimonidine (ALPHAGAN) 0.2 % ophthalmic solution, Place 1 drop into both eyes 2 (two) times daily., Disp: 10 mL, Rfl: 3   Cholecalciferol (VITAMIN D3) 25 MCG (1000 UT) CAPS, Take 1 capsule (1,000 Units total) by mouth daily at 6 (six) AM., Disp: 60 capsule, Rfl: 6   doxazosin (CARDURA) 8 MG tablet, Take 8 mg by mouth daily., Disp: , Rfl:    furosemide (LASIX) 20 MG tablet, Take 20 mg by mouth daily., Disp: , Rfl:    hydrALAZINE (APRESOLINE) 50 MG tablet, Take 50 mg by mouth 3 (three) times daily., Disp: , Rfl:    multivitamin (RENA-VIT) TABS tablet, Take 1 tablet by mouth daily., Disp: , Rfl:    pantoprazole (PROTONIX) 40 MG tablet, TAKE ONE TABLET BY MOUTH TWICE A DAY, Disp: 30 tablet, Rfl: 0   pravastatin (PRAVACHOL) 20 MG tablet, TAKE ONE TABLET (20MG  TOTAL) BY MOUTH DAILY, Disp: 90 tablet, Rfl: 3   sacubitril-valsartan (ENTRESTO) 97-103 MG, TAKE ONE TABLET BY MOUTH TWICE A DAY, Disp: 60 tablet, Rfl: 3   sildenafil (VIAGRA) 100 MG tablet, Take 0.5-1 tablets (50-100 mg total) by mouth daily as needed for erectile dysfunction., Disp: 60 tablet, Rfl: 1   spironolactone (ALDACTONE) 25 MG tablet,  Take 0.5 tablets (12.5 mg total) by mouth daily., Disp: 15 tablet, Rfl: 6   torsemide (DEMADEX) 20 MG tablet, Take 20 mg by mouth daily as needed (swelling)., Disp: , Rfl:    levothyroxine (SYNTHROID) 50 MCG tablet, TAKE ONE TABLET ( TOTAL) BY MOUTH DAILY BEFORE BREAKFAST, Disp: 30 tablet, Rfl: 0  No Known Allergies  Objective:   BP (!) 150/60   Pulse (!) 53   Temp 98.2 F (36.8 C) (Oral)   Ht 5\' 8"  (1.727 m)   Wt 146 lb (66.2 kg)   SpO2 99%   BMI 22.20 kg/m      04/22/2023    2:15 PM 01/24/2023   11:29 AM 12/06/2022    2:02 PM  Vitals with BMI  Height 5\' 8"  5\' 8"  5\' 8"   Weight 146 lbs 151 lbs 6 oz 149 lbs  BMI 22.2 23.03 22.66  Systolic 150 126 161  Diastolic 60 72 74  Pulse 53 75      Physical Exam Vitals and nursing note reviewed.  Constitutional:      Appearance: Normal appearance. He is normal weight.  HENT:     Head: Normocephalic and atraumatic.  Skin:    General: Skin is warm and dry.     Capillary Refill: Capillary refill takes less than 2 seconds.     Comments: 2.5cm mass to right parotid area that is tender to palpation and non-mobile  Neurological:     General: No focal deficit present.     Mental Status: He is alert and oriented to person, place, and time. Mental status is at baseline.  Psychiatric:        Mood and Affect: Mood normal.        Behavior: Behavior normal.        Thought Content: Thought content normal.        Judgment: Judgment normal.     Assessment & Plan:  Parotid mass Assessment & Plan: Patient has a mass to his right cheek in the parotid area that is increasing in size over the last few months and becoming more painful. The mass is immobile, tender, and non-fluctuant on exam. No redness or warmth, no systemic symptoms. Will order CT for further evaluation.  Orders: -     CT SOFT TISSUE NECK W CONTRAST; Future  Hypothyroidism, unspecified type -     TSH  Other orders -     Levothyroxine Sodium; TAKE ONE TABLET (  TOTAL) BY MOUTH DAILY BEFORE BREAKFAST  Dispense: 30 tablet; Refill: 0     Follow up plan: Return if symptoms worsen or fail to improve.  Park Meo, FNP

## 2023-04-22 NOTE — Assessment & Plan Note (Signed)
Patient has a mass to his right cheek in the parotid area that is increasing in size over the last few months and becoming more painful. The mass is immobile, tender, and non-fluctuant on exam. No redness or warmth, no systemic symptoms. Will order CT for further evaluation.

## 2023-04-23 DIAGNOSIS — Z992 Dependence on renal dialysis: Secondary | ICD-10-CM | POA: Diagnosis not present

## 2023-04-23 DIAGNOSIS — N186 End stage renal disease: Secondary | ICD-10-CM | POA: Diagnosis not present

## 2023-04-23 LAB — TSH: TSH: 3.53 mIU/L (ref 0.40–4.50)

## 2023-04-23 IMAGING — MR MR THORACIC SPINE W/O CM
5 of 8 series · 27 of 48 positions shown · non-contrast
Comparison: MR thoracic 07/05/2014; X-ray thoracic 12/07/2021.

CLINICAL DATA: Chronic upper/mid back pain.

EXAM:
MRI THORACIC SPINE WITHOUT CONTRAST
TECHNIQUE: Multiplanar, multisequence MR imaging of the thoracic spine was
performed. No intravenous contrast was administered.

[Series 16: T1 · sagittal · 5.0mm · 1.77mm/px · 3 of 10 slices shown (1 of 2)]
[im 1/10]
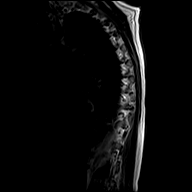
[im 5/10]
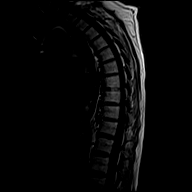
[im 10/10]
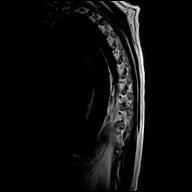

[Series 17: T2 · sagittal · 3.0mm · 0.82mm/px · 5 of 17 slices shown (1 of 2)]
[im 1/17]
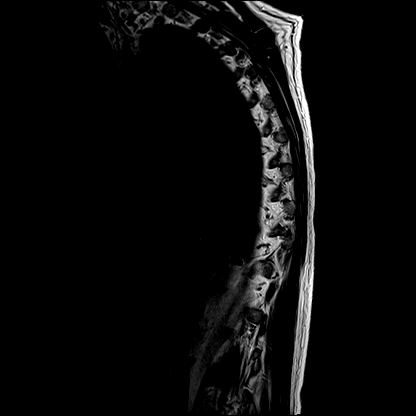
[im 5/17]
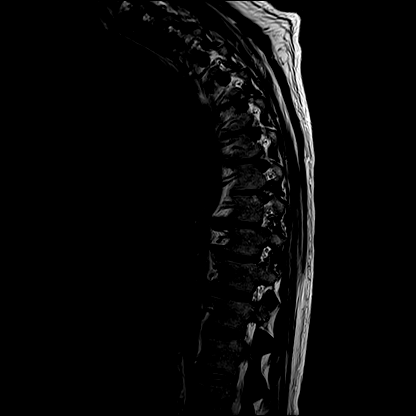
[im 9/17]
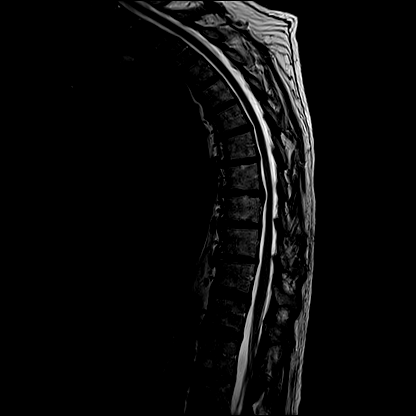
[im 13/17]
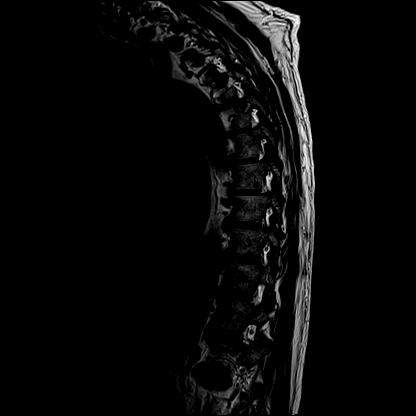
[im 17/17]
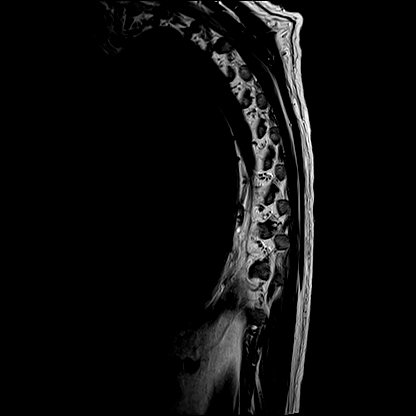

[Series 18: T1 · sagittal · 3.0mm · 0.79mm/px · 5 of 17 slices shown (2 of 2)]
[im 1/17]
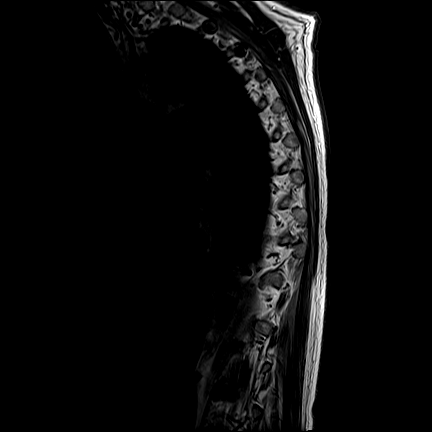
[im 5/17]
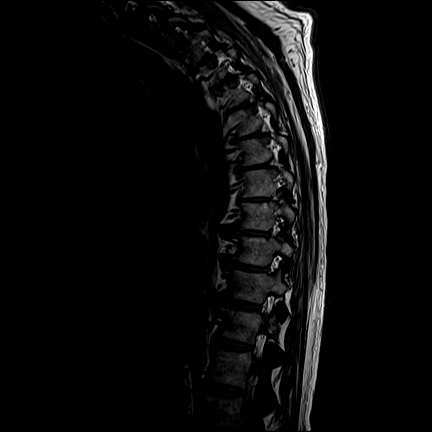
[im 9/17]
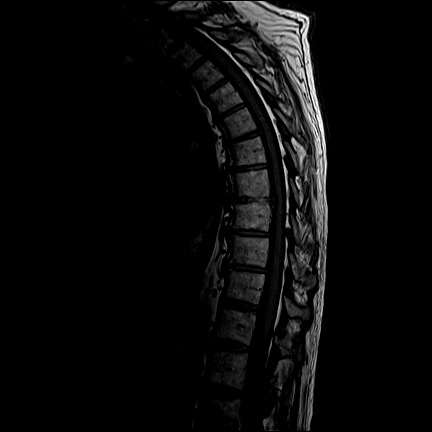
[im 13/17]
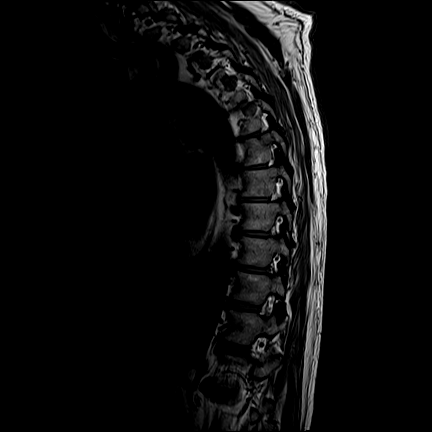
[im 17/17]
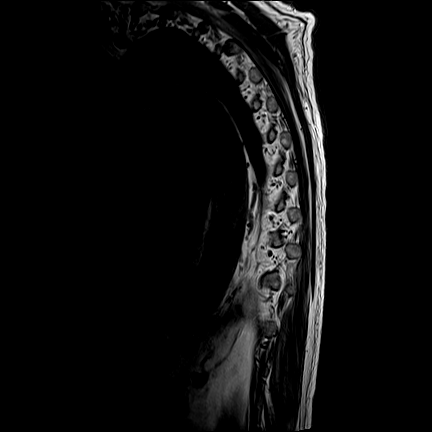

[Series 19: STIR · sagittal · 3.0mm · 0.41mm/px · 3 of 17 slices shown]
[im 1/17]
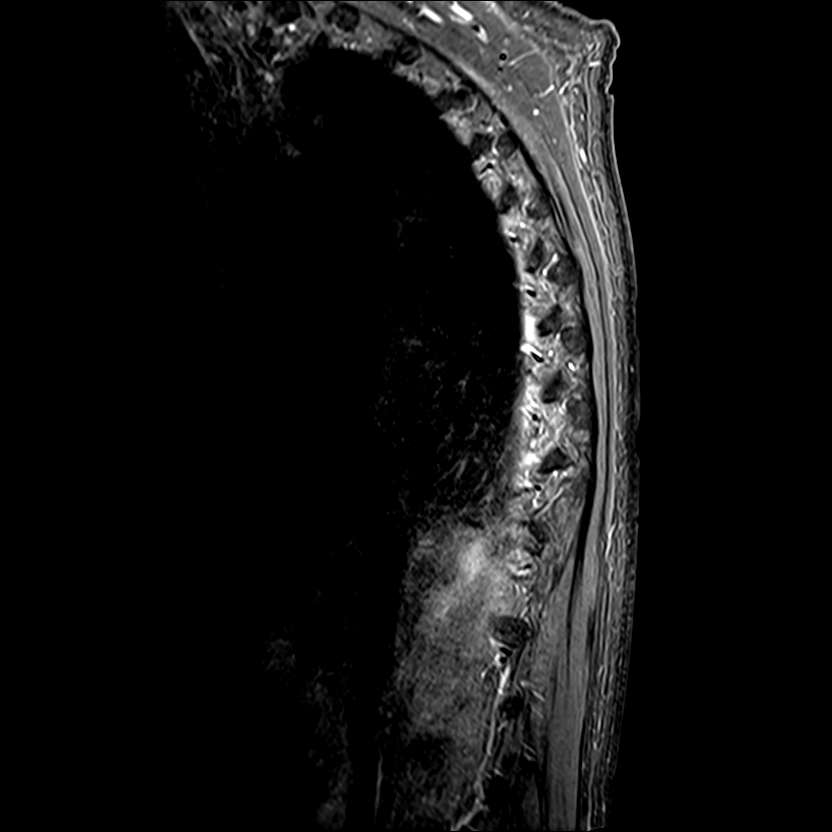
[im 5/17]
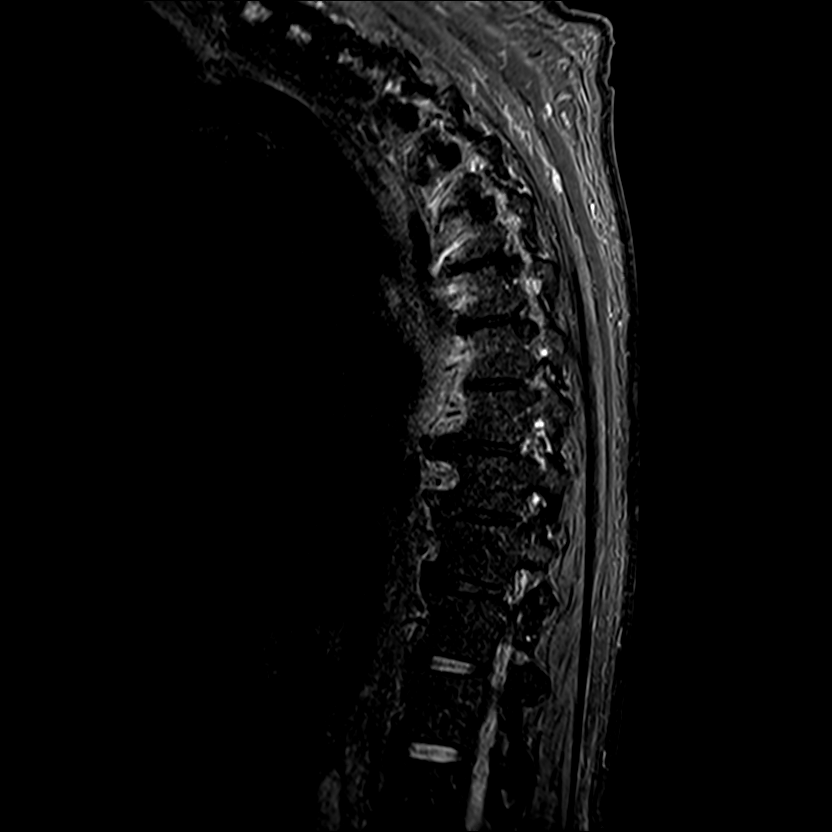
[im 9/17]
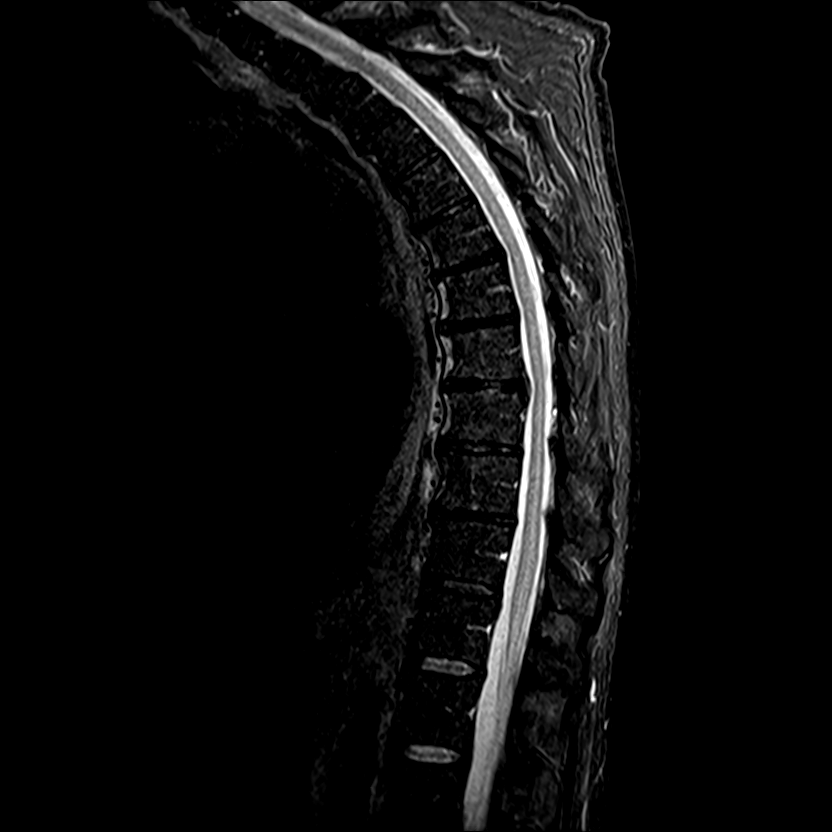

[Series 20: T2 · axial · 4.0mm · 0.59mm/px · z∈[-354,+32]mm · 11 of 39 slices shown (2 of 2)]
[im 1/39]
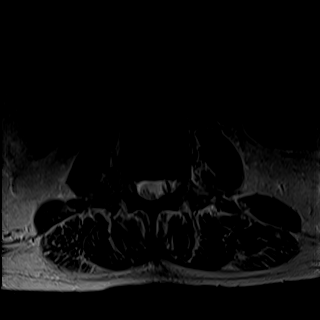
[im 4/39]
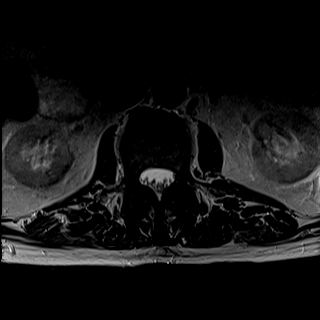
[im 8/39]
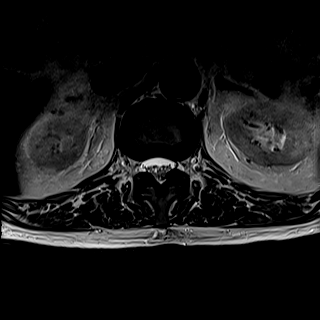
[im 12/39]
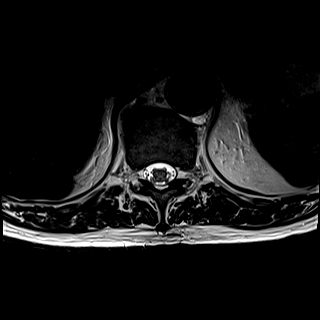
[im 16/39]
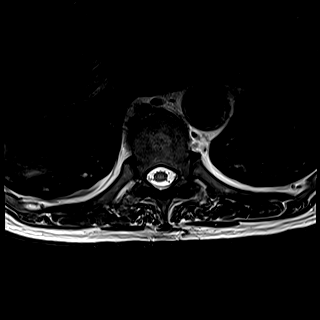
[im 20/39]
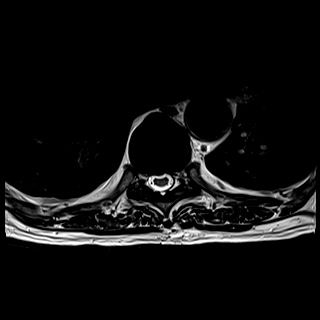
[im 23/39]
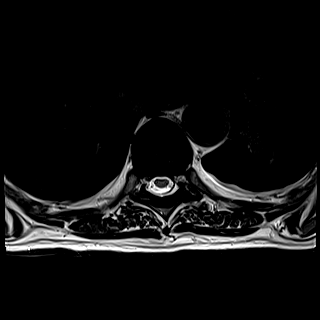
[im 27/39]
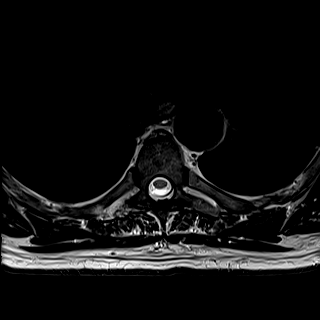
[im 31/39]
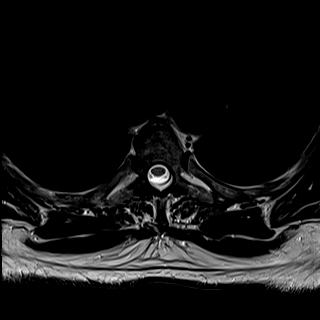
[im 35/39]
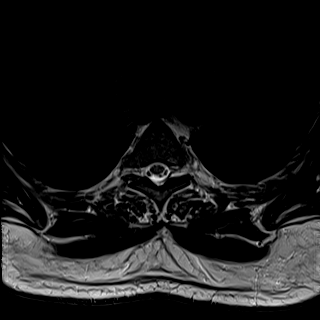
[im 39/39]
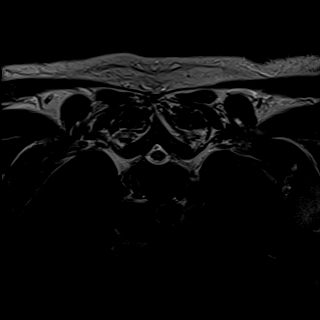

[27 of 48 positions shown; findings below may reference images not displayed]

FINDINGS: Alignment: Chronically exaggerated upper thoracic kyphosis.
Unchanged trace anterolisthesis of T2 on T3.

Vertebrae: No fracture, suspicious marrow lesion, or significant
marrow edema.

Cord:  Normal signal.

Paraspinal and other soft tissues: Left renal cysts measuring up to
2.4 cm.

Disc levels:

Unchanged small central disc protrusions at T6-7, T7-8, and T11-12
without associated stenosis or significant spinal cord mass effect.
Unchanged moderate-sized central disc extrusion at T8-9 which mildly
indents the ventral spinal cord without significant stenosis.
Widespread moderate thoracic facet arthrosis, greatest on the left
at T7-8 where there is moderate left neural foraminal stenosis.
IMPRESSION: 1. Unchanged thoracic disc herniations, the largest being at T8-9
where there is mild spinal cord indentation. No significant spinal
stenosis.
2. Moderate facet arthrosis with left neural foraminal stenosis at
T7-8.

## 2023-04-24 DIAGNOSIS — Z992 Dependence on renal dialysis: Secondary | ICD-10-CM | POA: Diagnosis not present

## 2023-04-24 DIAGNOSIS — N186 End stage renal disease: Secondary | ICD-10-CM | POA: Diagnosis not present

## 2023-04-25 DIAGNOSIS — N186 End stage renal disease: Secondary | ICD-10-CM | POA: Diagnosis not present

## 2023-04-25 DIAGNOSIS — Z992 Dependence on renal dialysis: Secondary | ICD-10-CM | POA: Diagnosis not present

## 2023-04-26 DIAGNOSIS — N186 End stage renal disease: Secondary | ICD-10-CM | POA: Diagnosis not present

## 2023-04-26 DIAGNOSIS — Z992 Dependence on renal dialysis: Secondary | ICD-10-CM | POA: Diagnosis not present

## 2023-04-27 DIAGNOSIS — N186 End stage renal disease: Secondary | ICD-10-CM | POA: Diagnosis not present

## 2023-04-27 DIAGNOSIS — Z992 Dependence on renal dialysis: Secondary | ICD-10-CM | POA: Diagnosis not present

## 2023-04-28 DIAGNOSIS — N186 End stage renal disease: Secondary | ICD-10-CM | POA: Diagnosis not present

## 2023-04-28 DIAGNOSIS — Z992 Dependence on renal dialysis: Secondary | ICD-10-CM | POA: Diagnosis not present

## 2023-04-29 DIAGNOSIS — N186 End stage renal disease: Secondary | ICD-10-CM | POA: Diagnosis not present

## 2023-04-29 DIAGNOSIS — Z992 Dependence on renal dialysis: Secondary | ICD-10-CM | POA: Diagnosis not present

## 2023-04-30 DIAGNOSIS — Z992 Dependence on renal dialysis: Secondary | ICD-10-CM | POA: Diagnosis not present

## 2023-04-30 DIAGNOSIS — N186 End stage renal disease: Secondary | ICD-10-CM | POA: Diagnosis not present

## 2023-05-01 DIAGNOSIS — N186 End stage renal disease: Secondary | ICD-10-CM | POA: Diagnosis not present

## 2023-05-01 DIAGNOSIS — Z992 Dependence on renal dialysis: Secondary | ICD-10-CM | POA: Diagnosis not present

## 2023-05-02 DIAGNOSIS — Z992 Dependence on renal dialysis: Secondary | ICD-10-CM | POA: Diagnosis not present

## 2023-05-02 DIAGNOSIS — N186 End stage renal disease: Secondary | ICD-10-CM | POA: Diagnosis not present

## 2023-05-03 DIAGNOSIS — Z992 Dependence on renal dialysis: Secondary | ICD-10-CM | POA: Diagnosis not present

## 2023-05-03 DIAGNOSIS — N186 End stage renal disease: Secondary | ICD-10-CM | POA: Diagnosis not present

## 2023-05-04 DIAGNOSIS — Z992 Dependence on renal dialysis: Secondary | ICD-10-CM | POA: Diagnosis not present

## 2023-05-04 DIAGNOSIS — N186 End stage renal disease: Secondary | ICD-10-CM | POA: Diagnosis not present

## 2023-05-05 DIAGNOSIS — N186 End stage renal disease: Secondary | ICD-10-CM | POA: Diagnosis not present

## 2023-05-05 DIAGNOSIS — Z992 Dependence on renal dialysis: Secondary | ICD-10-CM | POA: Diagnosis not present

## 2023-05-06 DIAGNOSIS — Z992 Dependence on renal dialysis: Secondary | ICD-10-CM | POA: Diagnosis not present

## 2023-05-06 DIAGNOSIS — N186 End stage renal disease: Secondary | ICD-10-CM | POA: Diagnosis not present

## 2023-05-07 ENCOUNTER — Other Ambulatory Visit: Payer: Self-pay | Admitting: Family Medicine

## 2023-05-07 ENCOUNTER — Ambulatory Visit (HOSPITAL_COMMUNITY)
Admission: RE | Admit: 2023-05-07 | Discharge: 2023-05-07 | Disposition: A | Discharge status: Home / Self Care | Payer: Medicare Other | Source: Ambulatory Visit | Attending: Family Medicine | Admitting: Family Medicine

## 2023-05-07 DIAGNOSIS — K118 Other diseases of salivary glands: Secondary | ICD-10-CM | POA: Diagnosis not present

## 2023-05-07 DIAGNOSIS — R221 Localized swelling, mass and lump, neck: Secondary | ICD-10-CM | POA: Diagnosis not present

## 2023-05-07 DIAGNOSIS — Z992 Dependence on renal dialysis: Secondary | ICD-10-CM | POA: Diagnosis not present

## 2023-05-07 DIAGNOSIS — I700 Atherosclerosis of aorta: Secondary | ICD-10-CM | POA: Diagnosis not present

## 2023-05-07 DIAGNOSIS — N186 End stage renal disease: Secondary | ICD-10-CM | POA: Diagnosis not present

## 2023-05-07 DIAGNOSIS — I6523 Occlusion and stenosis of bilateral carotid arteries: Secondary | ICD-10-CM | POA: Diagnosis not present

## 2023-05-07 LAB — POCT I-STAT CREATININE: Creatinine, Ser: 4.2 mg/dL — ABNORMAL HIGH (ref 0.61–1.24)

## 2023-05-07 MED ORDER — IOHEXOL 300 MG/ML  SOLN
75.0000 mL | Freq: Once | INTRAMUSCULAR | Status: AC | PRN
  Administered 2023-05-07: 75 mL via INTRAVENOUS

## 2023-05-08 DIAGNOSIS — N186 End stage renal disease: Secondary | ICD-10-CM | POA: Diagnosis not present

## 2023-05-08 DIAGNOSIS — Z992 Dependence on renal dialysis: Secondary | ICD-10-CM | POA: Diagnosis not present

## 2023-05-08 NOTE — Telephone Encounter (Signed)
Future OV schedule 05/30/23.  Requested Prescriptions  Pending Prescriptions Disp Refills   pantoprazole (PROTONIX) 40 MG tablet [Pharmacy Med Name: PANTOPRAZOLE SODIUM 40 MG DR TAB] 90 tablet 0    Sig: TAKE ONE TABLET BY MOUTH TWICE A DAY     Gastroenterology: Proton Pump Inhibitors Failed - 05/07/2023  1:37 PM      Failed - Valid encounter within last 12 months    Recent Outpatient Visits           1 year ago Hypothyroidism, unspecified type   [facility] Donita Brooks, MD   1 year ago Chest pain due to GERD   [facility] Donita Brooks, MD   2 years ago Essential hypertension   [facility] Donita Brooks, MD   2 years ago End stage renal disease (HCC)   [facility] Donita Brooks, MD   2 years ago Depression, recurrent (HCC)   [facility] Pickard, Priscille Heidelberg, MD       Future Appointments             In 3 weeks Pickard, Priscille Heidelberg, MD [facility]   In 2 months Branch, Dorothe Pea, MD [facility] at [place], [place]

## 2023-05-09 DIAGNOSIS — Z992 Dependence on renal dialysis: Secondary | ICD-10-CM | POA: Diagnosis not present

## 2023-05-09 DIAGNOSIS — N186 End stage renal disease: Secondary | ICD-10-CM | POA: Diagnosis not present

## 2023-05-10 DIAGNOSIS — Z992 Dependence on renal dialysis: Secondary | ICD-10-CM | POA: Diagnosis not present

## 2023-05-10 DIAGNOSIS — N186 End stage renal disease: Secondary | ICD-10-CM | POA: Diagnosis not present

## 2023-05-11 DIAGNOSIS — Z992 Dependence on renal dialysis: Secondary | ICD-10-CM | POA: Diagnosis not present

## 2023-05-11 DIAGNOSIS — N186 End stage renal disease: Secondary | ICD-10-CM | POA: Diagnosis not present

## 2023-05-12 DIAGNOSIS — N186 End stage renal disease: Secondary | ICD-10-CM | POA: Diagnosis not present

## 2023-05-12 DIAGNOSIS — Z992 Dependence on renal dialysis: Secondary | ICD-10-CM | POA: Diagnosis not present

## 2023-05-13 ENCOUNTER — Encounter (INDEPENDENT_AMBULATORY_CARE_PROVIDER_SITE_OTHER): Payer: Medicare Other | Admitting: Ophthalmology

## 2023-05-13 DIAGNOSIS — Z992 Dependence on renal dialysis: Secondary | ICD-10-CM | POA: Diagnosis not present

## 2023-05-13 DIAGNOSIS — N186 End stage renal disease: Secondary | ICD-10-CM | POA: Diagnosis not present

## 2023-05-14 DIAGNOSIS — Z992 Dependence on renal dialysis: Secondary | ICD-10-CM | POA: Diagnosis not present

## 2023-05-14 DIAGNOSIS — N186 End stage renal disease: Secondary | ICD-10-CM | POA: Diagnosis not present

## 2023-05-15 DIAGNOSIS — N186 End stage renal disease: Secondary | ICD-10-CM | POA: Diagnosis not present

## 2023-05-15 DIAGNOSIS — Z992 Dependence on renal dialysis: Secondary | ICD-10-CM | POA: Diagnosis not present

## 2023-05-16 ENCOUNTER — Encounter: Payer: Medicare Other | Admitting: Pharmacist

## 2023-05-16 ENCOUNTER — Ambulatory Visit (HOSPITAL_COMMUNITY): Payer: Medicare Other

## 2023-05-16 DIAGNOSIS — N186 End stage renal disease: Secondary | ICD-10-CM | POA: Diagnosis not present

## 2023-05-16 DIAGNOSIS — Z992 Dependence on renal dialysis: Secondary | ICD-10-CM | POA: Diagnosis not present

## 2023-05-17 ENCOUNTER — Telehealth: Payer: Self-pay

## 2023-05-17 ENCOUNTER — Other Ambulatory Visit: Payer: Self-pay | Admitting: Family Medicine

## 2023-05-17 DIAGNOSIS — Z992 Dependence on renal dialysis: Secondary | ICD-10-CM | POA: Diagnosis not present

## 2023-05-17 DIAGNOSIS — K118 Other diseases of salivary glands: Secondary | ICD-10-CM

## 2023-05-17 DIAGNOSIS — N186 End stage renal disease: Secondary | ICD-10-CM | POA: Diagnosis not present

## 2023-05-17 NOTE — Telephone Encounter (Signed)
STAT CALL REPORT:  IMPRESSION: 1. Solitary thick-walled and rim enhancing cystic 2 cm mass along the anterolateral margin of the right parotid space is most suspicious for Necrotic Lymph Node, such as can be seen with metastatic nodal disease of a regional skin cancer. Negative CT appearance of other neck soft tissues. Primary salivary gland neoplasm also possible, while metastatic disease from a distant primary is less likely. Recommend ENT consultation for biopsy or excision.   2. Advanced calcified atherosclerosis including bulky bilateral carotid bifurcation plaque with hemodynamically significant ICA stenoses, RADIOGRAPHIC STRING SIGN on the Right. Aortic Atherosclerosis (ICD10-I70.0).

## 2023-05-18 DIAGNOSIS — Z992 Dependence on renal dialysis: Secondary | ICD-10-CM | POA: Diagnosis not present

## 2023-05-18 DIAGNOSIS — N186 End stage renal disease: Secondary | ICD-10-CM | POA: Diagnosis not present

## 2023-05-19 DIAGNOSIS — Z992 Dependence on renal dialysis: Secondary | ICD-10-CM | POA: Diagnosis not present

## 2023-05-19 DIAGNOSIS — N186 End stage renal disease: Secondary | ICD-10-CM | POA: Diagnosis not present

## 2023-05-20 DIAGNOSIS — Z992 Dependence on renal dialysis: Secondary | ICD-10-CM | POA: Diagnosis not present

## 2023-05-20 DIAGNOSIS — N186 End stage renal disease: Secondary | ICD-10-CM | POA: Diagnosis not present

## 2023-05-20 NOTE — Progress Notes (Signed)
STAT ENT referral has been placed by my partner and patinet notified of findings.

## 2023-05-21 ENCOUNTER — Other Ambulatory Visit: Payer: Medicare Other

## 2023-05-21 DIAGNOSIS — E785 Hyperlipidemia, unspecified: Secondary | ICD-10-CM

## 2023-05-21 DIAGNOSIS — I10 Essential (primary) hypertension: Secondary | ICD-10-CM | POA: Diagnosis not present

## 2023-05-21 DIAGNOSIS — D649 Anemia, unspecified: Secondary | ICD-10-CM

## 2023-05-21 DIAGNOSIS — E1122 Type 2 diabetes mellitus with diabetic chronic kidney disease: Secondary | ICD-10-CM

## 2023-05-21 DIAGNOSIS — N185 Chronic kidney disease, stage 5: Secondary | ICD-10-CM | POA: Diagnosis not present

## 2023-05-21 DIAGNOSIS — Z992 Dependence on renal dialysis: Secondary | ICD-10-CM | POA: Diagnosis not present

## 2023-05-21 DIAGNOSIS — Z951 Presence of aortocoronary bypass graft: Secondary | ICD-10-CM

## 2023-05-21 DIAGNOSIS — N186 End stage renal disease: Secondary | ICD-10-CM | POA: Diagnosis not present

## 2023-05-22 DIAGNOSIS — Z992 Dependence on renal dialysis: Secondary | ICD-10-CM | POA: Diagnosis not present

## 2023-05-22 DIAGNOSIS — N186 End stage renal disease: Secondary | ICD-10-CM | POA: Diagnosis not present

## 2023-05-22 LAB — COMPLETE METABOLIC PANEL WITH GFR
AG Ratio: 1.6 (calc) (ref 1.0–2.5)
ALT: 12 U/L (ref 9–46)
AST: 13 U/L (ref 10–35)
Albumin: 3.9 g/dL (ref 3.6–5.1)
Alkaline phosphatase (APISO): 96 U/L (ref 35–144)
BUN/Creatinine Ratio: 14 (calc) (ref 6–22)
BUN: 62 mg/dL — ABNORMAL HIGH (ref 7–25)
CO2: 22 mmol/L (ref 20–32)
Calcium: 9.1 mg/dL (ref 8.6–10.3)
Chloride: 108 mmol/L (ref 98–110)
Creat: 4.59 mg/dL — ABNORMAL HIGH (ref 0.70–1.28)
Globulin: 2.4 g/dL (calc) (ref 1.9–3.7)
Glucose, Bld: 115 mg/dL — ABNORMAL HIGH (ref 65–99)
Potassium: 4.7 mmol/L (ref 3.5–5.3)
Sodium: 141 mmol/L (ref 135–146)
Total Bilirubin: 0.8 mg/dL (ref 0.2–1.2)
Total Protein: 6.3 g/dL (ref 6.1–8.1)
eGFR: 12 mL/min/{1.73_m2} — ABNORMAL LOW (ref >=60)

## 2023-05-22 LAB — CBC WITH DIFFERENTIAL/PLATELET
Absolute Monocytes: 481 cells/uL (ref 200–950)
Basophils Absolute: 30 {cells}/uL (ref 0–200)
Basophils Relative: 0.4 %
Eosinophils Absolute: 481 cells/uL (ref 15–500)
Eosinophils Relative: 6.5 %
HCT: 29.7 % — ABNORMAL LOW (ref 38.5–50.0)
Hemoglobin: 10.1 g/dL — ABNORMAL LOW (ref 13.2–17.1)
Lymphs Abs: 792 {cells}/uL — ABNORMAL LOW (ref 850–3900)
MCH: 33.8 pg — ABNORMAL HIGH (ref 27.0–33.0)
MCHC: 34 g/dL (ref 32.0–36.0)
MCV: 99.3 fL (ref 80.0–100.0)
MPV: 12.4 fL (ref 7.5–12.5)
Monocytes Relative: 6.5 %
Neutro Abs: 5617 {cells}/uL (ref 1500–7800)
Neutrophils Relative %: 75.9 %
Platelets: 167 10*3/uL (ref 140–400)
RBC: 2.99 10*6/uL — ABNORMAL LOW (ref 4.20–5.80)
RDW: 12.4 % (ref 11.0–15.0)
Total Lymphocyte: 10.7 %
WBC: 7.4 10*3/uL (ref 3.8–10.8)

## 2023-05-22 LAB — LIPID PANEL
Cholesterol: 142 mg/dL (ref <200)
HDL: 41 mg/dL (ref >=40)
LDL Cholesterol (Calc): 83 mg/dL
Non-HDL Cholesterol (Calc): 101 mg/dL (ref <130)
Total CHOL/HDL Ratio: 3.5 (calc) (ref <5.0)
Triglycerides: 90 mg/dL (ref <150)

## 2023-05-22 LAB — VITAMIN B12: Vitamin B-12: 473 pg/mL (ref 200–1100)

## 2023-05-22 LAB — HEMOGLOBIN A1C
Hgb A1c MFr Bld: 6 %{Hb} — ABNORMAL HIGH (ref <5.7)
Mean Plasma Glucose: 126 mg/dL
eAG (mmol/L): 7 mmol/L

## 2023-05-23 ENCOUNTER — Other Ambulatory Visit: Payer: Medicare Other

## 2023-05-23 ENCOUNTER — Telehealth: Payer: Self-pay | Admitting: Family Medicine

## 2023-05-23 DIAGNOSIS — Z992 Dependence on renal dialysis: Secondary | ICD-10-CM | POA: Diagnosis not present

## 2023-05-23 DIAGNOSIS — N186 End stage renal disease: Secondary | ICD-10-CM | POA: Diagnosis not present

## 2023-05-23 NOTE — Telephone Encounter (Signed)
Patient called to follow up on referral received to [facility]; stated they don't accept his insurance [facility]). Requesting for referral to be send to a different facility.   Please advise at [phone].

## 2023-05-24 DIAGNOSIS — N186 End stage renal disease: Secondary | ICD-10-CM | POA: Diagnosis not present

## 2023-05-24 DIAGNOSIS — Z992 Dependence on renal dialysis: Secondary | ICD-10-CM | POA: Diagnosis not present

## 2023-05-25 DIAGNOSIS — N186 End stage renal disease: Secondary | ICD-10-CM | POA: Diagnosis not present

## 2023-05-25 DIAGNOSIS — Z992 Dependence on renal dialysis: Secondary | ICD-10-CM | POA: Diagnosis not present

## 2023-05-26 DIAGNOSIS — Z992 Dependence on renal dialysis: Secondary | ICD-10-CM | POA: Diagnosis not present

## 2023-05-26 DIAGNOSIS — N186 End stage renal disease: Secondary | ICD-10-CM | POA: Diagnosis not present

## 2023-05-27 ENCOUNTER — Other Ambulatory Visit: Payer: Self-pay | Admitting: Family Medicine

## 2023-05-27 DIAGNOSIS — Z992 Dependence on renal dialysis: Secondary | ICD-10-CM | POA: Diagnosis not present

## 2023-05-27 DIAGNOSIS — N186 End stage renal disease: Secondary | ICD-10-CM | POA: Diagnosis not present

## 2023-05-28 DIAGNOSIS — N186 End stage renal disease: Secondary | ICD-10-CM | POA: Diagnosis not present

## 2023-05-28 DIAGNOSIS — Z992 Dependence on renal dialysis: Secondary | ICD-10-CM | POA: Diagnosis not present

## 2023-05-29 DIAGNOSIS — N186 End stage renal disease: Secondary | ICD-10-CM | POA: Diagnosis not present

## 2023-05-29 DIAGNOSIS — Z992 Dependence on renal dialysis: Secondary | ICD-10-CM | POA: Diagnosis not present

## 2023-05-30 ENCOUNTER — Ambulatory Visit (INDEPENDENT_AMBULATORY_CARE_PROVIDER_SITE_OTHER): Payer: Medicare Other | Admitting: Family Medicine

## 2023-05-30 ENCOUNTER — Encounter: Payer: Self-pay | Admitting: Family Medicine

## 2023-05-30 VITALS — BP 122/62 | HR 70 | Temp 98.0°F | Ht 68.0 in | Wt 149.0 lb

## 2023-05-30 DIAGNOSIS — Z992 Dependence on renal dialysis: Secondary | ICD-10-CM | POA: Diagnosis not present

## 2023-05-30 DIAGNOSIS — N186 End stage renal disease: Secondary | ICD-10-CM

## 2023-05-30 DIAGNOSIS — K118 Other diseases of salivary glands: Secondary | ICD-10-CM | POA: Diagnosis not present

## 2023-05-30 DIAGNOSIS — I5022 Chronic systolic (congestive) heart failure: Secondary | ICD-10-CM | POA: Diagnosis not present

## 2023-05-30 DIAGNOSIS — I10 Essential (primary) hypertension: Secondary | ICD-10-CM

## 2023-05-30 DIAGNOSIS — E039 Hypothyroidism, unspecified: Secondary | ICD-10-CM

## 2023-05-30 DIAGNOSIS — Z0001 Encounter for general adult medical examination with abnormal findings: Secondary | ICD-10-CM | POA: Diagnosis not present

## 2023-05-30 MED ORDER — CYCLOBENZAPRINE HCL 5 MG PO TABS: 5.0000 mg | ORAL_TABLET | Freq: Three times a day (TID) | ORAL | 1 refills | Status: DC | PRN

## 2023-05-30 NOTE — Progress Notes (Signed)
Subjective:    Patient ID: Bradley Black, male    DOB: 01-Nov-1945, 77 y.o.   MRN: [id number]  Patient is a 77 year old Caucasian gentleman here today for complete physical exam.  Past medical history is significant for coronary artery disease as well as congestive heart failure.  His last echocardiogram was in 2022 which showed improvement in his ejection fraction to 60%.  He also has end-stage renal failure hemodialysis dependent.  He is currently undergoing peritoneal dialysis every night.  He also has a history of hypertension.  He reports severe fatigue and lightheadedness.  His blood pressure has been averaging around 100 systolic over 50 diastolic.  Therefore I feel that he is dealing primarily with hypertension.  He is on numerous medications.  He denies any chest pain.  He does have fatigue and weakness but denies shortness of breath.  Recently diagnosed with Othon mass.  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  He has an appointment next week to see ENT to work this up.  His most recent lab work is listed below.  Labs are significant for 2 point drop in his hemoglobin since last year.  He is uncertain if he is receiving erythropoietin from his renal doctor.  He is not currently taking any iron supplement. Lab on 05/21/2023  Component Date Value Ref Range Status   WBC 05/21/2023 7.4  3.8 - 10.8 Thousand/uL Final   RBC 05/21/2023 2.99 (L)  4.20 - 5.80 Million/uL Final   Hemoglobin 05/21/2023 10.1 (L)  13.2 - 17.1 g/dL Final   HCT 40/98/1191 29.7 (L)  38.5 - 50.0 % Final   MCV 05/21/2023 99.3  80.0 - 100.0 fL Final   MCH 05/21/2023 33.8 (H)  27.0 - 33.0 pg Final   MCHC 05/21/2023 34.0  32.0 - 36.0 g/dL Final   RDW 47/82/9562 12.4  11.0 - 15.0 % Final   Platelets 05/21/2023 167  140 - 400 Thousand/uL Final   MPV 05/21/2023 12.4  7.5 - 12.5 fL Final   Neutro Abs 05/21/2023 5,617  1,500 - 7,800 cells/uL Final   Lymphs Abs 05/21/2023 792 (L)  850 - 3,900 cells/uL Final    Absolute Monocytes 05/21/2023 481  200 - 950 cells/uL Final   Eosinophils Absolute 05/21/2023 481  15 - 500 cells/uL Final   Basophils Absolute 05/21/2023 30  0 - 200 cells/uL Final   Neutrophils Relative % 05/21/2023 75.9  % Final   Total Lymphocyte 05/21/2023 10.7  % Final   Monocytes Relative 05/21/2023 6.5  % Final   Eosinophils Relative 05/21/2023 6.5  % Final   Basophils Relative 05/21/2023 0.4  % Final   Glucose, Bld 05/21/2023 115 (H)  65 - 99 mg/dL Final   Comment: .            Fasting reference interval . For someone without known diabetes, a glucose value between 100 and 125 mg/dL is consistent with prediabetes and should be confirmed with a follow-up test. .    BUN 05/21/2023 62 (H)  7 - 25 mg/dL Final   Creat 13/05/6577 4.59 (H)  0.70 - 1.28 mg/dL Final   eGFR 46/96/2952 12 (L)  > OR = 60 mL/min/1.60m2 Final   BUN/Creatinine Ratio 05/21/2023 14  6 - 22 (calc) Final   Sodium 05/21/2023 141  135 - 146 mmol/L Final   Potassium 05/21/2023 4.7  3.5 - 5.3 mmol/L Final   Chloride 05/21/2023 108  98 - 110 mmol/L Final   CO2  05/21/2023 22  20 - 32 mmol/L Final   Calcium 05/21/2023 9.1  8.6 - 10.3 mg/dL Final   Total Protein 16/07/9603 6.3  6.1 - 8.1 g/dL Final   Albumin 54/06/8118 3.9  3.6 - 5.1 g/dL Final   Globulin 14/78/2956 2.4  1.9 - 3.7 g/dL (calc) Final   AG Ratio 05/21/2023 1.6  1.0 - 2.5 (calc) Final   Total Bilirubin 05/21/2023 0.8  0.2 - 1.2 mg/dL Final   Alkaline phosphatase (APISO) 05/21/2023 96  35 - 144 U/L Final   AST 05/21/2023 13  10 - 35 U/L Final   ALT 05/21/2023 12  9 - 46 U/L Final   Hgb A1c MFr Bld 05/21/2023 6.0 (H)  <5.7 % of total Hgb Final   Comment: For someone without known diabetes, a hemoglobin  A1c value between 5.7% and 6.4% is consistent with prediabetes and should be confirmed with a  follow-up test. . For someone with known diabetes, a value <7% indicates that their diabetes is well controlled. A1c targets should be individualized  based on duration of diabetes, age, comorbid conditions, and other considerations. . This assay result is consistent with an increased risk of diabetes. . Currently, no consensus exists regarding use of hemoglobin A1c for diagnosis of diabetes for children. .    Mean Plasma Glucose 05/21/2023 126  mg/dL Final   eAG (mmol/L) 21/30/8657 7.0  mmol/L Final   Comment: . This test was performed on the Roche cobas c503 platform. Effective 07/09/22, a change in test platforms from the Abbott Architect to the Roche cobas c503 may have shifted HbA1c results compared to historical results. Based on laboratory validation testing conducted at [facility], the Roche platform relative to the Abbott platform had an average increase in HbA1c value of < or = 0.3%. This difference is within accepted  variability established by the [facility]. Note that not all individuals will have had a shift in their results and direct comparisons between historical and current results for testing conducted on different platforms is not recommended.    Cholesterol 05/21/2023 142  <200 mg/dL Final   HDL 84/69/6295 41  > OR = 40 mg/dL Final   Triglycerides 28/41/3244 90  <150 mg/dL Final   LDL Cholesterol (Calc) 05/21/2023 83  mg/dL (calc) Final   Comment: Reference range: <100 . Desirable range <100 mg/dL for primary prevention;   <70 mg/dL for patients with CHD or diabetic patients  with > or = 2 CHD risk factors. [name] LDL-C is now calculated using the Martin-Hopkins  calculation, which is a validated novel method providing  better accuracy than the Friedewald equation in the  estimation of LDL-C.  Horald Pollen et al. Lenox Ahr. 0102;725(36): 2061-2068  (http://education.[facility].com/faq/FAQ164)    Total CHOL/HDL Ratio 05/21/2023 3.5  <6.4 (calc) Final   Non-HDL Cholesterol (Calc) 05/21/2023 101  <130 mg/dL (calc) Final   Comment: For patients with diabetes plus 1 major  ASCVD risk  factor, treating to a non-HDL-C goal of <100 mg/dL  (LDL-C of <40 mg/dL) is considered a therapeutic  option.    Vitamin B-12 05/21/2023 473  200 - 1,100 pg/mL Final  Hospital Outpatient Visit on 05/07/2023  Component Date Value Ref Range Status   Creatinine, Ser 05/07/2023 4.20 (H)  0.61 - 1.24 mg/dL Final      Past Medical History:  Diagnosis Date   BPH (benign prostatic hyperplasia)    CAD S/P percutaneous coronary angioplasty    a. PTCA of OM 1988 & 1994  b. PCI with BMS to LAD in 1997 c. RCA PCI BMS 1999 & 2000 d. s/p CABG in 11/2011 with LIMA-LAD, SVG-PDA, SVG-OM2, and SVG-D1   Cataract    Chronic back pain    CKD (chronic kidney disease), stage III (HCC)    Cyst of bursa    R shoulder   Diabetic retinopathy (HCC)    DM (diabetes mellitus), type 2 with renal complications (HCC)    Essential hypertension    Frequent PVCs    GERD (gastroesophageal reflux disease)    Hypertensive retinopathy    Ischemic cardiomyopathy 11/2011   Intra-OP TEE: EF 40-45%, no regional WMA; improved Anterior WM post CABG.   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   LIMA to LAD, SVG to D1, SVG to OM2, SVG to PDA, EVH via right thigh and leg   Past Surgical History:  Procedure Laterality Date   AV FISTULA PLACEMENT Left 07/28/2020   Procedure: LEFT ARM ARTERIOVENOUS (AV) FISTULA  CREATION;  Surgeon: Larina Earthly, MD;  Location: [facility];  Service: Vascular;  Laterality: Left;   BACK SURGERY  10/01/1968   BIOPSY  01/25/2022   Procedure: BIOPSY;  Surgeon: Corbin Ade, MD;  Location: [facility];  Service: Endoscopy;;   CARDIAC CATHETERIZATION  10/02/2011   CATARACT EXTRACTION W/PHACO Left 09/14/2022   Procedure: CATARACT EXTRACTION PHACO AND INTRAOCULAR LENS PLACEMENT (IOC);  Surgeon: Fabio Pierce, MD;  Location: [facility];  Service: Ophthalmology;  Laterality: Left;  CDE:  8.66   CATARACT EXTRACTION W/PHACO Right 09/28/2022   Procedure: CATARACT EXTRACTION PHACO AND INTRAOCULAR LENS PLACEMENT (IOC);  Surgeon: Fabio Pierce, MD;  Location: [facility];  Service: Ophthalmology;  Laterality: Right;  CDE 7.79   COLONOSCOPY WITH PROPOFOL N/A 01/25/2022   Procedure: COLONOSCOPY WITH PROPOFOL;  Surgeon: Corbin Ade, MD;  [facility];  Service: Endoscopy;  Laterality: N/A;  10:30am   CORONARY ANGIOPLASTY  10/01/1992   OM   CORONARY ANGIOPLASTY  10/02/1995   LAD   CORONARY ANGIOPLASTY WITH STENT PLACEMENT  10/01/1997   RCA   CORONARY ANGIOPLASTY WITH STENT PLACEMENT  10/02/1999   RCA   CORONARY ARTERY BYPASS GRAFT  12/27/2011   Procedure: CORONARY ARTERY BYPASS GRAFTING (CABG);  Surgeon: Purcell Nails, MD;  Location: [facility];  Service: Open Heart Surgery;  Laterality: N/A;  Times four. On pump. Using endoscopically harvested right greater saphenous vein and left internal mammary artery.    ESOPHAGOGASTRODUODENOSCOPY (EGD) WITH PROPOFOL N/A 01/25/2022   Procedure: ESOPHAGOGASTRODUODENOSCOPY (EGD) WITH PROPOFOL;  Surgeon: Corbin Ade, MD;  [facility];  Service: Endoscopy;  Laterality: N/A;   HERNIA REPAIR Bilateral    INCISION AND DRAINAGE OF WOUND  10/01/2004   axilla   INSERTION OF DIALYSIS CATHETER Right 07/25/2020   Procedure: INSERTION OF DIALYSIS CATHETER;  Surgeon: Lucretia Roers, MD;  Location: [facility];  Service: General;  Laterality: Right;   INTRAOPERATIVE TRANSESOPHAGEAL ECHOCARDIOGRAM  12/27/2011   Global hypokinesis with EF of 40-45%, improved LAD distribution wall motion.   LEFT HEART CATHETERIZATION WITH CORONARY ANGIOGRAM N/A 12/13/2011   Procedure: LEFT HEART CATHETERIZATION WITH CORONARY ANGIOGRAM;  Surgeon: Marykay Lex, MD;  Location: [facility] CATH LAB;  Service: Cardiovascular;  Laterality: N/A;   LUMBAR FUSION     MASS EXCISION  10/24/2011   R arm   POLYPECTOMY  01/25/2022   Procedure: POLYPECTOMY;  Surgeon: Corbin Ade, MD;  [facility];  Service: Endoscopy;;   RIGHT HEART CATH N/A 07/12/2020   Procedure: RIGHT HEART CATH;  Surgeon: Lyn Records, MD;  Location: [facility] INVASIVE CV LAB;  Service: Cardiovascular;  Laterality: N/A;   TRANSESOPHAGEAL ECHOCARDIOGRAM  10/02/2011   Current Outpatient Medications on File Prior to Visit  Medication Sig Dispense Refill   amLODipine (NORVASC) 5 MG tablet Take 1 tablet (5 mg total) by mouth daily. 90 tablet 1   aspirin EC 81 MG tablet Take 81 mg by mouth daily.     Cholecalciferol (VITAMIN D3) 25 MCG (1000 UT) CAPS Take 1 capsule (1,000 Units total) by mouth daily at 6 (six) AM. 60 capsule 6   doxazosin (CARDURA) 8 MG tablet Take 8 mg by mouth daily.     furosemide (LASIX) 20 MG tablet Take 20 mg by mouth daily.     hydrALAZINE (APRESOLINE) 50 MG tablet Take 50 mg by mouth 3 (three) times daily.     levothyroxine (SYNTHROID) 50 MCG tablet TAKE ONE TABLET ( TOTAL) BY MOUTH DAILY BEFORE BREAKFAST 30 tablet 0   multivitamin (RENA-VIT) TABS tablet Take 1 tablet by mouth daily.     pantoprazole (PROTONIX) 40 MG tablet TAKE ONE TABLET BY MOUTH TWICE A DAY 90 tablet 0   pravastatin (PRAVACHOL) 20 MG tablet TAKE ONE TABLET (20MG  TOTAL) BY MOUTH DAILY 90 tablet 3   sacubitril-valsartan (ENTRESTO) 97-103 MG TAKE ONE TABLET BY MOUTH TWICE A DAY 60 tablet 3   sildenafil (VIAGRA) 100 MG tablet Take 0.5-1 tablets (50-100 mg total) by mouth daily as needed for erectile dysfunction. 60 tablet 1   spironolactone (ALDACTONE) 25 MG tablet Take 0.5 tablets (12.5 mg total) by mouth daily. 15 tablet 6   torsemide (DEMADEX) 20 MG tablet Take 20 mg by mouth daily as needed (swelling).     No current facility-administered medications on file prior to visit.   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  Tobacco Use   Smoking status: Former    Current packs/day: 0.00    Average packs/day: 1 pack/day for 15.0 years (15.0 ttl pk-yrs)    Types: Cigarettes    Start date: 10/02/1971    Quit date: 10/01/1986    Years since quitting: 36.6   Smokeless tobacco: Never   Tobacco comments:    quit about 30 yrs ago  Vaping Use   Vaping status: Never Used  Substance and Sexual Activity   Alcohol use: No   Drug use: No   Sexual activity: Not Currently  Other Topics Concern   Not on file  Social History Narrative   Married,  father of one,  grandfather 1. Works out at [employer] at [employer] roughly 2-3 days a week. He works on a treadmill. He does note having a hard time getting his heart rate up.   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   He quit smoking in 1988, and does not drink alcohol.   Social Determinants of Health   Financial Resource Strain: Low Risk  (12/06/2022)   Overall Financial Resource Strain (CARDIA)    Difficulty of Paying  Living Expenses: Not hard at all  Food Insecurity: No Food Insecurity (12/06/2022)   Hunger Vital Sign    Worried About Running Out of Food in the Last Year: Never true    Ran Out of Food in the Last Year: Never true  Transportation Needs: No Transportation Needs (12/06/2022)   PRAPARE - Administrator, Civil Service (Medical): No    Lack of Transportation (Non-Medical): No  Physical Activity: Insufficiently Active (12/06/2022)   Exercise Vital Sign    Days of Exercise per Week: 3 days    Minutes of Exercise per Session: 30 min  Stress: No Stress Concern Present (12/06/2022)   [employer] of Occupational Health - Occupational Stress Questionnaire    Feeling of Stress : Not at all  Social Connections: Moderately Integrated (12/06/2022)   Social Connection and Isolation Panel [NHANES]    Frequency of Communication with Friends and Family:  More than three times a week    Frequency of Social Gatherings with Friends and Family: More than three times a week    Attends Religious Services: 1 to 4 times per year    Active Member of [employer] or Organizations: No    Attends Banker Meetings: Never    Marital Status: Married  Catering manager Violence: Not At Risk (12/06/2022)   Humiliation, Afraid, Rape, and Kick questionnaire    Fear of Current or Ex-Partner: No    Emotionally Abused: No    Physically Abused: No    Sexually Abused: No      Review of Systems  All other systems reviewed and are negative.      Objective:   Physical Exam Vitals reviewed.  Constitutional:      General: He is not in acute distress.    Appearance: Normal appearance. He is not ill-appearing or toxic-appearing.  HENT:     Head: Normocephalic and atraumatic.     Salivary Glands: Right salivary gland is diffusely enlarged.      Mouth/Throat:     Mouth: Mucous membranes are moist.     Pharynx: No oropharyngeal exudate.  Eyes:     Extraocular Movements: Extraocular movements intact.     Conjunctiva/sclera: Conjunctivae normal.     Pupils: Pupils are equal, round, and reactive to light.  Neck:     Vascular: No carotid bruit.  Cardiovascular:     Rate and Rhythm: Normal rate and regular rhythm.     Heart sounds: Normal heart sounds.  Pulmonary:     Effort: Pulmonary effort is normal. No respiratory distress.     Breath sounds: Normal breath sounds. No wheezing, rhonchi or rales.  Abdominal:     General: Abdomen is flat. Bowel sounds are normal. There is no distension.     Palpations: Abdomen is soft.     Tenderness: There is no abdominal tenderness. There is no guarding.  Musculoskeletal:     Cervical back: Normal range of motion. No rigidity.     Right lower leg: No edema.     Left lower leg: No edema.  Lymphadenopathy:     Cervical: No cervical adenopathy.  Skin:    Findings: No erythema or rash.  Neurological:      General: No focal deficit present.     Mental Status: He is alert and oriented to person, place, and time. Mental status is at baseline.     Cranial Nerves: No cranial nerve deficit.     Motor: No weakness.     Coordination: Coordination  normal.     Gait: Gait normal.           Assessment & Plan:  Mass of right parotid gland  Hypothyroidism, unspecified type  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HCC)  Chronic systolic congestive heart failure (HCC)  Benign essential HTN I believe his fatigue is multifactorial and related to his kidney failure on dialysis, congestive heart failure, hypertension, and and possibly malignancy in the right parotid gland.  He is also anemic.  Therefore I will try to address what issues I can.  Decrease doxazosin to 4 mg a day to reduce hypotension.  Patient has an appointment to see ENT next week to discuss removal and biopsy of the lesion in the right parotid gland.  Begin ferrous sulfate 325 mg daily for anemia.  Patient sees his nephrologist monthly and asked him to discuss possible erythropoietin to help address his anemia if necessary.  TSH is normal.  Patient declines shingles vaccine.  Recommended annual flu shot and COVID booster.  Reassess next week to see if symptoms are improving on less blood pressure medication.

## 2023-05-31 DIAGNOSIS — Z992 Dependence on renal dialysis: Secondary | ICD-10-CM | POA: Diagnosis not present

## 2023-05-31 DIAGNOSIS — N186 End stage renal disease: Secondary | ICD-10-CM | POA: Diagnosis not present

## 2023-06-01 DIAGNOSIS — Z992 Dependence on renal dialysis: Secondary | ICD-10-CM | POA: Diagnosis not present

## 2023-06-01 DIAGNOSIS — N186 End stage renal disease: Secondary | ICD-10-CM | POA: Diagnosis not present

## 2023-06-02 DIAGNOSIS — N186 End stage renal disease: Secondary | ICD-10-CM | POA: Diagnosis not present

## 2023-06-02 DIAGNOSIS — Z992 Dependence on renal dialysis: Secondary | ICD-10-CM | POA: Diagnosis not present

## 2023-06-03 DIAGNOSIS — N186 End stage renal disease: Secondary | ICD-10-CM | POA: Diagnosis not present

## 2023-06-03 DIAGNOSIS — Z992 Dependence on renal dialysis: Secondary | ICD-10-CM | POA: Diagnosis not present

## 2023-06-04 DIAGNOSIS — N185 Chronic kidney disease, stage 5: Secondary | ICD-10-CM | POA: Diagnosis not present

## 2023-06-04 DIAGNOSIS — N186 End stage renal disease: Secondary | ICD-10-CM | POA: Diagnosis not present

## 2023-06-04 DIAGNOSIS — I2510 Atherosclerotic heart disease of native coronary artery without angina pectoris: Secondary | ICD-10-CM | POA: Diagnosis not present

## 2023-06-04 DIAGNOSIS — K118 Other diseases of salivary glands: Secondary | ICD-10-CM | POA: Diagnosis not present

## 2023-06-04 DIAGNOSIS — I255 Ischemic cardiomyopathy: Secondary | ICD-10-CM | POA: Diagnosis not present

## 2023-06-04 DIAGNOSIS — Z992 Dependence on renal dialysis: Secondary | ICD-10-CM | POA: Diagnosis not present

## 2023-06-05 DIAGNOSIS — Z992 Dependence on renal dialysis: Secondary | ICD-10-CM | POA: Diagnosis not present

## 2023-06-05 DIAGNOSIS — N186 End stage renal disease: Secondary | ICD-10-CM | POA: Diagnosis not present

## 2023-06-06 DIAGNOSIS — Z992 Dependence on renal dialysis: Secondary | ICD-10-CM | POA: Diagnosis not present

## 2023-06-06 DIAGNOSIS — N186 End stage renal disease: Secondary | ICD-10-CM | POA: Diagnosis not present

## 2023-06-07 DIAGNOSIS — N186 End stage renal disease: Secondary | ICD-10-CM | POA: Diagnosis not present

## 2023-06-07 DIAGNOSIS — C07 Malignant neoplasm of parotid gland: Secondary | ICD-10-CM | POA: Diagnosis not present

## 2023-06-07 DIAGNOSIS — Z992 Dependence on renal dialysis: Secondary | ICD-10-CM | POA: Diagnosis not present

## 2023-06-08 DIAGNOSIS — Z992 Dependence on renal dialysis: Secondary | ICD-10-CM | POA: Diagnosis not present

## 2023-06-08 DIAGNOSIS — N186 End stage renal disease: Secondary | ICD-10-CM | POA: Diagnosis not present

## 2023-06-09 DIAGNOSIS — Z992 Dependence on renal dialysis: Secondary | ICD-10-CM | POA: Diagnosis not present

## 2023-06-09 DIAGNOSIS — N186 End stage renal disease: Secondary | ICD-10-CM | POA: Diagnosis not present

## 2023-06-10 ENCOUNTER — Telehealth: Payer: Self-pay | Admitting: *Deleted

## 2023-06-10 ENCOUNTER — Telehealth: Payer: Self-pay | Admitting: Cardiology

## 2023-06-10 DIAGNOSIS — N186 End stage renal disease: Secondary | ICD-10-CM | POA: Diagnosis not present

## 2023-06-10 DIAGNOSIS — Z992 Dependence on renal dialysis: Secondary | ICD-10-CM | POA: Diagnosis not present

## 2023-06-10 NOTE — Telephone Encounter (Signed)
Pt has been scheduled for tele pre op appt 06/21/23 @ 2 pm. Med rec and consent are done.     Patient Consent for Virtual Visit        Bradley Black has provided verbal consent on 06/10/2023 for a virtual visit (video or telephone).   CONSENT FOR VIRTUAL VISIT FOR:  Bradley Black  By participating in this virtual visit I agree to the following:  I hereby voluntarily request, consent and authorize [facility] and its employed or contracted physicians, physician assistants, nurse practitioners or other licensed health care professionals (the Practitioner), to provide me with telemedicine health care services (the "Services") as deemed necessary by the treating Practitioner. I acknowledge and consent to receive the Services by the Practitioner via telemedicine. I understand that the telemedicine visit will involve communicating with the Practitioner through live audiovisual communication technology and the disclosure of certain medical information by electronic transmission. I acknowledge that I have been given the opportunity to request an in-person assessment or other available alternative prior to the telemedicine visit and am voluntarily participating in the telemedicine visit.  I understand that I have the right to withhold or withdraw my consent to the use of telemedicine in the course of my care at any time, without affecting my right to future care or treatment, and that the Practitioner or I may terminate the telemedicine visit at any time. I understand that I have the right to inspect all information obtained and/or recorded in the course of the telemedicine visit and may receive copies of available information for a reasonable fee.  I understand that some of the potential risks of receiving the Services via telemedicine include:  Delay or interruption in medical evaluation due to technological equipment failure or disruption; Information transmitted may not be sufficient (e.g.  poor resolution of images) to allow for appropriate medical decision making by the Practitioner; and/or  In rare instances, security protocols could fail, causing a breach of personal health information.  Furthermore, I acknowledge that it is my responsibility to provide information about my medical history, conditions and care that is complete and accurate to the best of my ability. I acknowledge that Practitioner's advice, recommendations, and/or decision may be based on factors not within their control, such as incomplete or inaccurate data provided by me or distortions of diagnostic images or specimens that may result from electronic transmissions. I understand that the practice of medicine is not an exact science and that Practitioner makes no warranties or guarantees regarding treatment outcomes. I acknowledge that a copy of this consent can be made available to me via my patient portal [facility] MyChart), or I can request a printed copy by calling the office of [facility].    I understand that my insurance will be billed for this visit.   I have read or had this consent read to me. I understand the contents of this consent, which adequately explains the benefits and risks of the Services being provided via telemedicine.  I have been provided ample opportunity to ask questions regarding this consent and the Services and have had my questions answered to my satisfaction. I give my informed consent for the services to be provided through the use of telemedicine in my medical care

## 2023-06-10 NOTE — Telephone Encounter (Signed)
   Pre-operative Risk Assessment    Patient Name: Bradley Black  DOB: Feb 11, 1946 MRN: [id number]      Request for Surgical Clearance    Procedure:   Right Parotidectomy with Right Modified Radical Neck Dissection  Date of Surgery:  Clearance 07/05/23                                 Surgeon:  Dr. Scarlette Ar Surgeon's Group or Practice Name:  [facility]  Phone number:  [phone]  Fax number:  [phone]   Type of Clearance Requested:   - Medical  - Pharmacy:  Hold Aspirin     Type of Anesthesia:  General    Additional requests/questions:   Caller [name]) stated they will need a pharmacy directive to hold patient's aspirin.   Signed, Annetta Maw   06/10/2023, 4:09 PM

## 2023-06-10 NOTE — Telephone Encounter (Signed)
Pt has been scheduled for tele pre op appt 06/21/23 @ 2 pm. Med rec and consent are done.

## 2023-06-10 NOTE — Telephone Encounter (Signed)
Pt has been scheduled for tele pre op appt 06/21/23. Med rec and consent are done.     Patient Consent for Virtual Visit        [name] has provided verbal consent on 06/10/2023 for a virtual visit (video or telephone).   CONSENT FOR VIRTUAL VISIT FOR:  Bradley Black  By participating in this virtual visit I agree to the following:  I hereby voluntarily request, consent and authorize [facility] and its employed or contracted physicians, physician assistants, nurse practitioners or other licensed health care professionals (the Practitioner), to provide me with telemedicine health care services (the "Services") as deemed necessary by the treating Practitioner. I acknowledge and consent to receive the Services by the Practitioner via telemedicine. I understand that the telemedicine visit will involve communicating with the Practitioner through live audiovisual communication technology and the disclosure of certain medical information by electronic transmission. I acknowledge that I have been given the opportunity to request an in-person assessment or other available alternative prior to the telemedicine visit and am voluntarily participating in the telemedicine visit.  I understand that I have the right to withhold or withdraw my consent to the use of telemedicine in the course of my care at any time, without affecting my right to future care or treatment, and that the Practitioner or I may terminate the telemedicine visit at any time. I understand that I have the right to inspect all information obtained and/or recorded in the course of the telemedicine visit and may receive copies of available information for a reasonable fee.  I understand that some of the potential risks of receiving the Services via telemedicine include:  Delay or interruption in medical evaluation due to technological equipment failure or disruption; Information transmitted may not be sufficient (e.g. poor  resolution of images) to allow for appropriate medical decision making by the Practitioner; and/or  In rare instances, security protocols could fail, causing a breach of personal health information.  Furthermore, I acknowledge that it is my responsibility to provide information about my medical history, conditions and care that is complete and accurate to the best of my ability. I acknowledge that Practitioner's advice, recommendations, and/or decision may be based on factors not within their control, such as incomplete or inaccurate data provided by me or distortions of diagnostic images or specimens that may result from electronic transmissions. I understand that the practice of medicine is not an exact science and that Practitioner makes no warranties or guarantees regarding treatment outcomes. I acknowledge that a copy of this consent can be made available to me via my patient portal [facility] MyChart), or I can request a printed copy by calling the office of [facility].    I understand that my insurance will be billed for this visit.   I have read or had this consent read to me. I understand the contents of this consent, which adequately explains the benefits and risks of the Services being provided via telemedicine.  I have been provided ample opportunity to ask questions regarding this consent and the Services and have had my questions answered to my satisfaction. I give my informed consent for the services to be provided through the use of telemedicine in my medical care

## 2023-06-10 NOTE — Telephone Encounter (Signed)
   Name: Bradley Black  DOB: 1946/09/16  MRN: [id number]  Primary Cardiologist: Dina Rich, MD   Preoperative team, please contact this patient and set up a phone call appointment for further preoperative risk assessment. Please obtain consent and complete medication review. Thank you for your help.  I confirm that guidance regarding antiplatelet and oral anticoagulation therapy has been completed and, if necessary, noted below.  Per office protocol, if patient is without any new symptoms or concerns at the time of their virtual visit, he/she may hold ASA for 7 days prior to procedure. Please resume aSA as soon as possible postprocedure, at the discretion of the surgeon.     Joni Reining, NP 06/10/2023, 4:40 PM [facility]

## 2023-06-11 DIAGNOSIS — Z992 Dependence on renal dialysis: Secondary | ICD-10-CM | POA: Diagnosis not present

## 2023-06-11 DIAGNOSIS — N186 End stage renal disease: Secondary | ICD-10-CM | POA: Diagnosis not present

## 2023-06-12 DIAGNOSIS — N186 End stage renal disease: Secondary | ICD-10-CM | POA: Diagnosis not present

## 2023-06-12 DIAGNOSIS — Z992 Dependence on renal dialysis: Secondary | ICD-10-CM | POA: Diagnosis not present

## 2023-06-13 ENCOUNTER — Other Ambulatory Visit (HOSPITAL_COMMUNITY): Payer: Self-pay | Admitting: Otolaryngology

## 2023-06-13 DIAGNOSIS — C07 Malignant neoplasm of parotid gland: Secondary | ICD-10-CM

## 2023-06-13 DIAGNOSIS — Z992 Dependence on renal dialysis: Secondary | ICD-10-CM | POA: Diagnosis not present

## 2023-06-13 DIAGNOSIS — N186 End stage renal disease: Secondary | ICD-10-CM | POA: Diagnosis not present

## 2023-06-14 DIAGNOSIS — N186 End stage renal disease: Secondary | ICD-10-CM | POA: Diagnosis not present

## 2023-06-14 DIAGNOSIS — Z992 Dependence on renal dialysis: Secondary | ICD-10-CM | POA: Diagnosis not present

## 2023-06-15 DIAGNOSIS — Z992 Dependence on renal dialysis: Secondary | ICD-10-CM | POA: Diagnosis not present

## 2023-06-15 DIAGNOSIS — N186 End stage renal disease: Secondary | ICD-10-CM | POA: Diagnosis not present

## 2023-06-16 DIAGNOSIS — Z992 Dependence on renal dialysis: Secondary | ICD-10-CM | POA: Diagnosis not present

## 2023-06-16 DIAGNOSIS — N186 End stage renal disease: Secondary | ICD-10-CM | POA: Diagnosis not present

## 2023-06-17 ENCOUNTER — Encounter (INDEPENDENT_AMBULATORY_CARE_PROVIDER_SITE_OTHER): Payer: Medicare Other | Admitting: Ophthalmology

## 2023-06-17 DIAGNOSIS — H35033 Hypertensive retinopathy, bilateral: Secondary | ICD-10-CM

## 2023-06-17 DIAGNOSIS — N186 End stage renal disease: Secondary | ICD-10-CM | POA: Diagnosis not present

## 2023-06-17 DIAGNOSIS — H35073 Retinal telangiectasis, bilateral: Secondary | ICD-10-CM

## 2023-06-17 DIAGNOSIS — H43813 Vitreous degeneration, bilateral: Secondary | ICD-10-CM

## 2023-06-17 DIAGNOSIS — I10 Essential (primary) hypertension: Secondary | ICD-10-CM

## 2023-06-17 DIAGNOSIS — H59033 Cystoid macular edema following cataract surgery, bilateral: Secondary | ICD-10-CM

## 2023-06-17 DIAGNOSIS — Z992 Dependence on renal dialysis: Secondary | ICD-10-CM | POA: Diagnosis not present

## 2023-06-18 DIAGNOSIS — Z992 Dependence on renal dialysis: Secondary | ICD-10-CM | POA: Diagnosis not present

## 2023-06-18 DIAGNOSIS — N186 End stage renal disease: Secondary | ICD-10-CM | POA: Diagnosis not present

## 2023-06-19 ENCOUNTER — Other Ambulatory Visit: Payer: Self-pay | Admitting: Student

## 2023-06-19 DIAGNOSIS — N186 End stage renal disease: Secondary | ICD-10-CM | POA: Diagnosis not present

## 2023-06-19 DIAGNOSIS — Z992 Dependence on renal dialysis: Secondary | ICD-10-CM | POA: Diagnosis not present

## 2023-06-20 ENCOUNTER — Encounter (HOSPITAL_COMMUNITY)
Admission: RE | Admit: 2023-06-20 | Discharge: 2023-06-20 | Disposition: A | Discharge status: Home / Self Care | Payer: Medicare Other | Source: Ambulatory Visit | Attending: Otolaryngology | Admitting: Otolaryngology

## 2023-06-20 DIAGNOSIS — N186 End stage renal disease: Secondary | ICD-10-CM | POA: Diagnosis not present

## 2023-06-20 DIAGNOSIS — Z992 Dependence on renal dialysis: Secondary | ICD-10-CM | POA: Diagnosis not present

## 2023-06-20 DIAGNOSIS — C07 Malignant neoplasm of parotid gland: Secondary | ICD-10-CM | POA: Insufficient documentation

## 2023-06-21 ENCOUNTER — Ambulatory Visit: Payer: Medicare Other | Attending: Cardiovascular Disease | Admitting: Student

## 2023-06-21 DIAGNOSIS — Z992 Dependence on renal dialysis: Secondary | ICD-10-CM | POA: Diagnosis not present

## 2023-06-21 DIAGNOSIS — Z01810 Encounter for preprocedural cardiovascular examination: Secondary | ICD-10-CM | POA: Diagnosis not present

## 2023-06-21 DIAGNOSIS — N186 End stage renal disease: Secondary | ICD-10-CM | POA: Diagnosis not present

## 2023-06-21 NOTE — Progress Notes (Signed)
Virtual Visit via Telephone Note   Because of Bradley Black's co-morbid illnesses, he is at least at moderate Black for complications without adequate follow up.  This format is felt to be most appropriate for this patient at this time.  The patient did not have access to video technology/had technical difficulties with video requiring transitioning to audio format only (telephone).  All issues noted in this document were discussed and addressed.  No physical exam could be performed with this format.  Please refer to the patient's chart for his consent to telehealth for [facility].  Evaluation Performed:  Preoperative cardiovascular Black assessment _____________   Date:  06/21/2023   Patient ID:  Bradley Black, DOB Aug 13, 1946, MRN [id number] Patient Location:  Home Provider location:   Office  Primary Care Provider:  Donita Brooks, MD Primary Cardiologist:  Dina Rich, MD  Chief Complaint / Patient Profile   77 y.o. 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 Ernestene Kiel on 07/05/2023 and presents today for telephonic preoperative cardiovascular Black assessment.  History of Present Illness    Bradley Black is a 77 y.o. male who presents via audio/video conferencing for a telehealth visit today.  Pt was last seen in cardiology clinic on 01/24/2023 by Dr. Wyline Mood.  At that time Bradley Black was stable from a cardiac standpoint.  The patient is now pending procedure as outlined above. Since his last visit, he is doing well. Patient denies shortness of breath at rest, lower extremity edema, orthopnea or PND. He experiences mild dyspnea with exertion that is normal for him and unchanged. No chest pain, pressure, or tightness.  No palpitations.  He stays active performing yard work and light to moderate household activities.   Past Medical History    Past Medical History:  Diagnosis Date   BPH (benign prostatic hyperplasia)    CAD S/P percutaneous coronary angioplasty    a. PTCA of OM 1988 & 1994 b. PCI with BMS to LAD in 1997 c. RCA PCI BMS 1999 & 2000 d. s/p CABG in 11/2011 with LIMA-LAD, SVG-PDA, SVG-OM2, and SVG-D1   Cataract    Chronic back pain    CKD (chronic kidney disease), stage III (HCC)    Cyst of bursa    R shoulder   Diabetic retinopathy (HCC)    DM (diabetes mellitus), type 2 with renal complications (HCC)    Essential hypertension    Frequent PVCs    GERD (gastroesophageal reflux disease)    Hypertensive retinopathy    Ischemic cardiomyopathy 11/2011   Intra-OP TEE: EF 40-45%, no regional WMA; improved Anterior WM post CABG.   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   LIMA to LAD, SVG to D1, SVG to OM2, SVG to PDA, EVH via right thigh and leg   Past Surgical History:  Procedure Laterality Date   AV FISTULA PLACEMENT Left 07/28/2020   Procedure: LEFT ARM ARTERIOVENOUS (AV) FISTULA  CREATION;  Surgeon: Larina Earthly, MD;  Location: [facility];  Service: Vascular;  Laterality: Left;   BACK SURGERY  10/01/1968   BIOPSY  01/25/2022   Procedure: BIOPSY;  Surgeon: Eula Listen  M, MD;  Location: [facility];  Service: Endoscopy;;   CARDIAC CATHETERIZATION  10/02/2011   CATARACT EXTRACTION W/PHACO Left 09/14/2022   Procedure: CATARACT EXTRACTION PHACO AND INTRAOCULAR LENS PLACEMENT (IOC);  Surgeon: Fabio Pierce, MD;  Location: [facility];  Service: Ophthalmology;  Laterality: Left;  CDE: 8.66   CATARACT EXTRACTION W/PHACO Right 09/28/2022   Procedure: CATARACT EXTRACTION PHACO AND INTRAOCULAR LENS PLACEMENT (IOC);  Surgeon: Fabio Pierce, MD;  Location: [facility];  Service: Ophthalmology;   Laterality: Right;  CDE 7.79   COLONOSCOPY WITH PROPOFOL N/A 01/25/2022   Procedure: COLONOSCOPY WITH PROPOFOL;  Surgeon: Corbin Ade, MD;  [facility];  Service: Endoscopy;  Laterality: N/A;  10:30am   CORONARY ANGIOPLASTY  10/01/1992   OM   CORONARY ANGIOPLASTY  10/02/1995   LAD   CORONARY ANGIOPLASTY WITH STENT PLACEMENT  10/01/1997   RCA   CORONARY ANGIOPLASTY WITH STENT PLACEMENT  10/02/1999   RCA   CORONARY ARTERY BYPASS GRAFT  12/27/2011   Procedure: CORONARY ARTERY BYPASS GRAFTING (CABG);  Surgeon: Purcell Nails, MD;  Location: [facility] OR;  Service: Open Heart Surgery;  Laterality: N/A;  Times four. On pump. Using endoscopically harvested right greater saphenous vein and left internal mammary artery.    ESOPHAGOGASTRODUODENOSCOPY (EGD) WITH PROPOFOL N/A 01/25/2022   Procedure: ESOPHAGOGASTRODUODENOSCOPY (EGD) WITH PROPOFOL;  Surgeon: Corbin Ade, MD;  [facility];  Service: Endoscopy;  Laterality: N/A;   HERNIA REPAIR Bilateral    INCISION AND DRAINAGE OF WOUND  10/01/2004   axilla   INSERTION OF DIALYSIS CATHETER Right 07/25/2020   Procedure: INSERTION OF DIALYSIS CATHETER;  Surgeon: Lucretia Roers, MD;  Location: [facility];  Service: General;  Laterality: Right;   INTRAOPERATIVE TRANSESOPHAGEAL ECHOCARDIOGRAM  12/27/2011   Global hypokinesis with EF of 40-45%, improved LAD distribution wall motion.   LEFT HEART CATHETERIZATION WITH CORONARY ANGIOGRAM N/A 12/13/2011   Procedure: LEFT HEART CATHETERIZATION WITH CORONARY ANGIOGRAM;  Surgeon: Marykay Lex, MD;  Location: [facility] CATH LAB;  Service: Cardiovascular;  Laterality: N/A;   LUMBAR FUSION     MASS EXCISION  10/24/2011   R arm   POLYPECTOMY  01/25/2022   Procedure: POLYPECTOMY;  Surgeon: Corbin Ade, MD;  [facility];  Service: Endoscopy;;   RIGHT HEART CATH N/A 07/12/2020   Procedure: RIGHT HEART CATH;  Surgeon: Lyn Records, MD;  Location: [facility] INVASIVE CV LAB;  Service:  Cardiovascular;  Laterality: N/A;   TRANSESOPHAGEAL ECHOCARDIOGRAM  10/02/2011    Allergies  No Known Allergies  Home Medications    Prior to Admission medications   Medication Sig Start Date End Date Taking? Authorizing Provider  amLODipine (NORVASC) 5 MG tablet Take 1 tablet (5 mg total) by mouth daily. 11/14/22   Donita Brooks, MD  aspirin EC 81 MG tablet Take 81 mg by mouth daily.    [provider]  brimonidine (ALPHAGAN) 0.2 % ophthalmic solution Place 1 drop into both eyes 2 (two) times daily.    [provider]  Cholecalciferol (VITAMIN D3) 25 MCG (1000 UT) CAPS Take 1 capsule (1,000 Units total) by mouth daily at 6 (six) AM. 05/04/21   Netta Neat., NP  cyclobenzaprine (FLEXERIL) 5 MG tablet Take 1 tablet (5 mg total) by mouth 3 (three) times daily as needed for muscle spasms. Patient not taking: Reported on 06/19/2023 05/30/23   Donita Brooks, MD  doxazosin (CARDURA) 1 MG tablet Take 2 mg by mouth daily. 04/25/21  [provider]  ferrous sulfate 325 (65 FE) MG tablet Take 325 mg by mouth daily with breakfast.    [provider]  hydrALAZINE (APRESOLINE) 50 MG tablet TAKE ONE AND ONE-HALF TABLETS (75MG  TOTAL) BY MOUTH THREE TIMES DAILY 06/20/23   Antoine Poche, MD  ketorolac (ACULAR) 0.5 % ophthalmic solution Place 1 drop into both eyes in the morning, at noon, and at bedtime.    [provider]  levothyroxine (SYNTHROID) 50 MCG tablet TAKE ONE TABLET ( TOTAL) BY MOUTH DAILY BEFORE BREAKFAST 05/30/23   Donita Brooks, MD  multivitamin (RENA-VIT) TABS tablet Take 1 tablet by mouth daily. 01/30/21   [provider]  pantoprazole (PROTONIX) 40 MG tablet TAKE ONE TABLET BY MOUTH TWICE A DAY 05/08/23   Donita Brooks, MD  pravastatin (PRAVACHOL) 20 MG tablet TAKE ONE TABLET (20MG  TOTAL) BY MOUTH DAILY 02/21/23   Antoine Poche, MD  sacubitril-valsartan (ENTRESTO) 97-103 MG TAKE ONE TABLET BY MOUTH TWICE A DAY  11/19/22   Antoine Poche, MD  sildenafil (VIAGRA) 100 MG tablet Take 0.5-1 tablets (50-100 mg total) by mouth daily as needed for erectile dysfunction. 06/12/22   Donita Brooks, MD  spironolactone (ALDACTONE) 25 MG tablet Take 0.5 tablets (12.5 mg total) by mouth daily. 01/24/23   Antoine Poche, MD  torsemide (DEMADEX) 20 MG tablet Take 20 mg by mouth daily. 03/29/21   [provider]    Physical Exam    Vital Signs:  Bradley Black does not have vital signs available for review today.  Given telephonic nature of communication, physical exam is limited. AAOx3. NAD. Normal affect.  Speech and respirations are unlabored.  Accessory Clinical Findings    None  Assessment & Plan    Primary Cardiologist: Dina Rich, MD  Preoperative cardiovascular Black assessment. Right parotidectomy with right modified radical neck dissection by Dr. Ernestene Kiel on 07/05/2023.  Chart reviewed as part of pre-operative protocol coverage. According to the RCRI, patient has a 11% Black of MACE. Patient reports activity equivalent to >4.0 METS (yard work and light to moderate household activities).   Given past medical history and time since last visit, based on ACC/AHA guidelines, Bradley Black for the planned procedure without further cardiovascular testing.   Patient was advised that if he develops new symptoms prior to surgery to contact our office to arrange a follow-up appointment.  he verbalized understanding.  Per office protocol, he may hold Aspirin for 7 days prior to procedure and should resume as soon as hemodynamically stable postoperatively.   I will route this recommendation to the requesting party via Epic fax function.  Please call with questions.  Time:   Today, I have spent 5 minutes with the patient with telehealth technology discussing medical history, symptoms, and management plan.     Carlos Levering, NP  06/21/2023, 7:34 AM

## 2023-06-22 DIAGNOSIS — N186 End stage renal disease: Secondary | ICD-10-CM | POA: Diagnosis not present

## 2023-06-22 DIAGNOSIS — Z992 Dependence on renal dialysis: Secondary | ICD-10-CM | POA: Diagnosis not present

## 2023-06-23 DIAGNOSIS — Z992 Dependence on renal dialysis: Secondary | ICD-10-CM | POA: Diagnosis not present

## 2023-06-23 DIAGNOSIS — N186 End stage renal disease: Secondary | ICD-10-CM | POA: Diagnosis not present

## 2023-06-24 DIAGNOSIS — Z992 Dependence on renal dialysis: Secondary | ICD-10-CM | POA: Diagnosis not present

## 2023-06-24 DIAGNOSIS — N186 End stage renal disease: Secondary | ICD-10-CM | POA: Diagnosis not present

## 2023-06-25 DIAGNOSIS — Z992 Dependence on renal dialysis: Secondary | ICD-10-CM | POA: Diagnosis not present

## 2023-06-25 DIAGNOSIS — N186 End stage renal disease: Secondary | ICD-10-CM | POA: Diagnosis not present

## 2023-06-26 DIAGNOSIS — N186 End stage renal disease: Secondary | ICD-10-CM | POA: Diagnosis not present

## 2023-06-26 DIAGNOSIS — Z992 Dependence on renal dialysis: Secondary | ICD-10-CM | POA: Diagnosis not present

## 2023-06-27 ENCOUNTER — Other Ambulatory Visit (HOSPITAL_COMMUNITY): Payer: Medicare Other

## 2023-06-27 ENCOUNTER — Encounter (HOSPITAL_COMMUNITY)
Admission: RE | Admit: 2023-06-27 | Discharge: 2023-06-27 | Disposition: A | Discharge status: Home / Self Care | Payer: Medicare Other | Source: Ambulatory Visit | Attending: Otolaryngology | Admitting: Otolaryngology

## 2023-06-27 ENCOUNTER — Other Ambulatory Visit: Payer: Self-pay | Admitting: Family Medicine

## 2023-06-27 DIAGNOSIS — K5730 Diverticulosis of large intestine without perforation or abscess without bleeding: Secondary | ICD-10-CM | POA: Diagnosis not present

## 2023-06-27 DIAGNOSIS — Z992 Dependence on renal dialysis: Secondary | ICD-10-CM | POA: Diagnosis not present

## 2023-06-27 DIAGNOSIS — C07 Malignant neoplasm of parotid gland: Secondary | ICD-10-CM | POA: Insufficient documentation

## 2023-06-27 DIAGNOSIS — R188 Other ascites: Secondary | ICD-10-CM | POA: Diagnosis not present

## 2023-06-27 DIAGNOSIS — I2510 Atherosclerotic heart disease of native coronary artery without angina pectoris: Secondary | ICD-10-CM | POA: Diagnosis not present

## 2023-06-27 DIAGNOSIS — K118 Other diseases of salivary glands: Secondary | ICD-10-CM | POA: Diagnosis not present

## 2023-06-27 DIAGNOSIS — N186 End stage renal disease: Secondary | ICD-10-CM | POA: Diagnosis not present

## 2023-06-27 MED ORDER — FLUDEOXYGLUCOSE F - 18 (FDG) INJECTION
7.7500 | Freq: Once | INTRAVENOUS | Status: AC | PRN
  Administered 2023-06-27: 7.75 via INTRAVENOUS

## 2023-06-28 ENCOUNTER — Other Ambulatory Visit: Payer: Self-pay | Admitting: Otolaryngology

## 2023-06-28 DIAGNOSIS — N186 End stage renal disease: Secondary | ICD-10-CM | POA: Diagnosis not present

## 2023-06-28 DIAGNOSIS — Z992 Dependence on renal dialysis: Secondary | ICD-10-CM | POA: Diagnosis not present

## 2023-06-28 NOTE — Telephone Encounter (Signed)
Last OV 05/30/23 within protocol.  Requested Prescriptions  Pending Prescriptions Disp Refills   pantoprazole (PROTONIX) 40 MG tablet [Pharmacy Med Name: PANTOPRAZOLE SODIUM 40 MG DR TAB] 90 tablet 0    Sig: TAKE ONE TABLET BY MOUTH TWICE A DAY     Gastroenterology: Proton Pump Inhibitors Failed - 06/27/2023  1:30 PM      Failed - Valid encounter within last 12 months    Recent Outpatient Visits           1 year ago Hypothyroidism, unspecified type   [facility] Donita Brooks, MD   1 year ago Chest pain due to GERD   [facility] Donita Brooks, MD   2 years ago Essential hypertension   [facility] Donita Brooks, MD   2 years ago End stage renal disease (HCC)   [facility] Donita Brooks, MD   2 years ago Depression, recurrent (HCC)   [facility] Pickard, Priscille Heidelberg, MD       Future Appointments             In 1 month Branch, Dorothe Pea, MD [facility] [place], [place]

## 2023-06-28 NOTE — Progress Notes (Signed)
Surgical Instructions    Your procedure is scheduled on July 05, 2023.  Report to [facility] Main Entrance "A" at 10:00 A.M., then check in with the Admitting office.  Call this number if you have problems the morning of surgery:  [phone]   If you have any questions prior to your surgery date call [phone]: Open Monday-Friday 8am-4pm If you experience any cold or flu symptoms such as cough, fever, chills, shortness of breath, etc. between now and your scheduled surgery, please notify us at the above number     Remember:  Do not eat after midnight the night before your surgery  You may drink clear liquids until 09:00 the morning of your surgery.   Clear liquids allowed are: Water, Non-Citrus Juices (without pulp), Carbonated Beverages, Clear Tea, Black Coffee ONLY (NO MILK, CREAM OR POWDERED CREAMER of any kind), and Gatorade    Take these medicines the morning of surgery with A SIP OF WATER:  amLODipine (NORVASC)  brimonidine (ALPHAGAN) 0.2 % ophthalmic solution  doxazosin (CARDURA)  hydrALAZINE (APRESOLINE)  ketorolac (ACULAR) 0.5 % ophthalmic solution levothyroxine (SYNTHROID)  pantoprazole (PROTONIX)  pravastatin (PRAVACHOL)   Follow your surgeon's instructions on stopping Aspirin. If no instructions were given please call your surgeon's office.   As of today, STOP taking any Aleve, Naproxen, Ibuprofen, Motrin, Advil, Goody's, BC's, all herbal medications, fish oil, and all vitamins.  WHAT DO I DO ABOUT MY DIABETES MEDICATION?   Do not take oral diabetes medicines (pills) the morning of surgery.   The day of surgery, do not take other diabetes injectables, including Byetta (exenatide), Bydureon (exenatide ER), Victoza (liraglutide), or Trulicity (dulaglutide).  If your CBG is greater than 220 mg/dL, you may take  of your sliding scale (correction) dose of insulin.   HOW TO MANAGE YOUR DIABETES BEFORE AND AFTER SURGERY  Why is it important to control my  blood sugar before and after surgery? Improving blood sugar levels before and after surgery helps healing and can limit problems. A way of improving blood sugar control is eating a healthy diet by:  Eating less sugar and carbohydrates  Increasing activity/exercise  Talking with your doctor about reaching your blood sugar goals High blood sugars (greater than 180 mg/dL) can raise your risk of infections and slow your recovery, so you will need to focus on controlling your diabetes during the weeks before surgery. Make sure that the doctor who takes care of your diabetes knows about your planned surgery including the date and location.  How do I manage my blood sugar before surgery? Check your blood sugar at least 4 times a day, starting 2 days before surgery, to make sure that the level is not too high or low.  Check your blood sugar the morning of your surgery when you wake up and every 2 hours until you get to the Short Stay unit.  If your blood sugar is less than 70 mg/dL, you will need to treat for low blood sugar: Do not take insulin. Treat a low blood sugar (less than 70 mg/dL) with  cup of clear juice (cranberry or apple), 4 glucose tablets, OR glucose gel. Recheck blood sugar in 15 minutes after treatment (to make sure it is greater than 70 mg/dL). If your blood sugar is not greater than 70 mg/dL on recheck, call [phone] for further instructions. Report your blood sugar to the short stay nurse when you get to Short Stay.  If you are admitted to the hospital after surgery: Your  blood sugar will be checked by the staff and you will probably be given insulin after surgery (instead of oral diabetes medicines) to make sure you have good blood sugar levels. The goal for blood sugar control after surgery is 80-180 mg/dL.   [facility] is not responsible for any belongings or valuables.    Do NOT Smoke (Tobacco/Vaping)  24 hours prior to your procedure  If you use a CPAP at night,  you may bring your mask for your overnight stay.   Contacts, glasses, hearing aids, dentures or partials may not be worn into surgery, please bring cases for these belongings   For patients admitted to the hospital, discharge time will be determined by your treatment team.   Patients discharged the day of surgery will not be allowed to drive home, and someone needs to stay with them for 24 hours.   SURGICAL WAITING ROOM VISITATION Patients having surgery or a procedure may have no more than 2 support people in the waiting area - these visitors may rotate.   Children under the age of 68 must have an adult with them who is not the patient. If the patient needs to stay at the hospital during part of their recovery, the visitor guidelines for inpatient rooms apply. Pre-op nurse will coordinate an appropriate time for 1 support person to accompany patient in pre-op.  This support person may not rotate.   Please refer to [email or web address] for the visitor guidelines for Inpatients (after your surgery is over and you are in a regular room).    Special instructions:    Oral Hygiene is also important to reduce your risk of infection.  Remember - BRUSH YOUR TEETH THE MORNING OF SURGERY WITH YOUR REGULAR TOOTHPASTE   [facility]- Preparing For Surgery  Before surgery, you can play an important role. Because skin is not sterile, your skin needs to be as free of germs as possible. You can reduce the number of germs on your skin by washing with CHG (chlorahexidine gluconate) Soap before surgery.  CHG is an antiseptic cleaner which kills germs and bonds with the skin to continue killing germs even after washing.     Please do not use if you have an allergy to CHG or antibacterial soaps. If your skin becomes reddened/irritated stop using the CHG.  Do not shave (including legs and underarms) for at least 48 hours prior to first CHG shower. It is OK to  shave your face.  Please follow these instructions carefully.     Shower the NIGHT BEFORE SURGERY and the MORNING OF SURGERY with CHG Soap.   If you chose to wash your hair, wash your hair first as usual with your normal shampoo. After you shampoo, rinse your hair and body thoroughly to remove the shampoo.  Then [employer] and genitals (private parts) with your normal soap and rinse thoroughly to remove soap.  After that Use CHG Soap as you would any other liquid soap. You can apply CHG directly to the skin and wash gently with a scrungie or a clean washcloth.   Apply the CHG Soap to your body ONLY FROM THE NECK DOWN.  Do not use on open wounds or open sores. Avoid contact with your eyes, ears, mouth and genitals (private parts). Wash Face and genitals (private parts)  with your normal soap.   Wash thoroughly, paying special attention to the area where your surgery will be performed.  Thoroughly rinse your body with warm water from the neck  down.  DO NOT shower/wash with your normal soap after using and rinsing off the CHG Soap.  Pat yourself dry with a CLEAN TOWEL.  Wear CLEAN PAJAMAS to bed the night before surgery  Place CLEAN SHEETS on your bed the night before your surgery  DO NOT SLEEP WITH PETS.   Day of Surgery:  Take a shower with CHG soap. Wear Clean/Comfortable clothing the morning of surgery          Do not wear jewelry or makeup. Do not wear lotions, powders, perfumes/cologne or deodorant. Do not shave 48 hours prior to surgery.  Men may shave face and neck. Do not bring valuables to the hospital. Do not wear nail polish, gel polish, artificial nails, or any other type of covering on natural nails (fingers and toes) If you have artificial nails or gel coating that need to be removed by a nail salon, please have this removed prior to surgery. Artificial nails or gel coating may interfere with anesthesia's ability to adequately monitor your vital signs.   Remember to  brush your teeth WITH YOUR REGULAR TOOTHPASTE.    If you received a COVID test during your pre-op visit, it is requested that you wear a mask when out in public, stay away from anyone that may not be feeling well, and notify your surgeon if you develop symptoms. If you have been in contact with anyone that has tested positive in the last 10 days, please notify your surgeon.    Please read over the following fact sheets that you were given.

## 2023-06-29 DIAGNOSIS — Z992 Dependence on renal dialysis: Secondary | ICD-10-CM | POA: Diagnosis not present

## 2023-06-29 DIAGNOSIS — N186 End stage renal disease: Secondary | ICD-10-CM | POA: Diagnosis not present

## 2023-06-30 DIAGNOSIS — N186 End stage renal disease: Secondary | ICD-10-CM | POA: Diagnosis not present

## 2023-06-30 DIAGNOSIS — Z992 Dependence on renal dialysis: Secondary | ICD-10-CM | POA: Diagnosis not present

## 2023-07-01 ENCOUNTER — Other Ambulatory Visit: Payer: Self-pay

## 2023-07-01 ENCOUNTER — Encounter (HOSPITAL_COMMUNITY): Payer: Self-pay

## 2023-07-01 ENCOUNTER — Encounter (HOSPITAL_COMMUNITY)
Admission: RE | Admit: 2023-07-01 | Discharge: 2023-07-01 | Disposition: A | Discharge status: Home / Self Care | Payer: Medicare Other | Source: Ambulatory Visit | Attending: Otolaryngology | Admitting: Otolaryngology

## 2023-07-01 VITALS — BP 153/65 | HR 69 | Temp 98.3°F | Resp 18 | Ht 68.0 in | Wt 146.8 lb

## 2023-07-01 DIAGNOSIS — I5042 Chronic combined systolic (congestive) and diastolic (congestive) heart failure: Secondary | ICD-10-CM | POA: Diagnosis not present

## 2023-07-01 DIAGNOSIS — Z01812 Encounter for preprocedural laboratory examination: Secondary | ICD-10-CM | POA: Insufficient documentation

## 2023-07-01 DIAGNOSIS — I493 Ventricular premature depolarization: Secondary | ICD-10-CM | POA: Diagnosis not present

## 2023-07-01 DIAGNOSIS — Z992 Dependence on renal dialysis: Secondary | ICD-10-CM | POA: Diagnosis not present

## 2023-07-01 DIAGNOSIS — N186 End stage renal disease: Secondary | ICD-10-CM | POA: Insufficient documentation

## 2023-07-01 DIAGNOSIS — D539 Nutritional anemia, unspecified: Secondary | ICD-10-CM | POA: Insufficient documentation

## 2023-07-01 DIAGNOSIS — E785 Hyperlipidemia, unspecified: Secondary | ICD-10-CM | POA: Insufficient documentation

## 2023-07-01 DIAGNOSIS — E1122 Type 2 diabetes mellitus with diabetic chronic kidney disease: Secondary | ICD-10-CM | POA: Insufficient documentation

## 2023-07-01 DIAGNOSIS — I255 Ischemic cardiomyopathy: Secondary | ICD-10-CM | POA: Insufficient documentation

## 2023-07-01 DIAGNOSIS — I2510 Atherosclerotic heart disease of native coronary artery without angina pectoris: Secondary | ICD-10-CM | POA: Insufficient documentation

## 2023-07-01 DIAGNOSIS — Z01818 Encounter for other preprocedural examination: Secondary | ICD-10-CM

## 2023-07-01 DIAGNOSIS — I132 Hypertensive heart and chronic kidney disease with heart failure and with stage 5 chronic kidney disease, or end stage renal disease: Secondary | ICD-10-CM | POA: Diagnosis not present

## 2023-07-01 LAB — CBC
HCT: 30.4 % — ABNORMAL LOW (ref 39.0–52.0)
Hemoglobin: 9.8 g/dL — ABNORMAL LOW (ref 13.0–17.0)
MCH: 32.5 pg (ref 26.0–34.0)
MCHC: 32.2 g/dL (ref 30.0–36.0)
MCV: 100.7 fL — ABNORMAL HIGH (ref 80.0–100.0)
Platelets: 203 10*3/uL (ref 150–400)
RBC: 3.02 MIL/uL — ABNORMAL LOW (ref 4.22–5.81)
RDW: 12.8 % (ref 11.5–15.5)
WBC: 8.4 10*3/uL (ref 4.0–10.5)
nRBC: 0 % (ref 0.0–0.2)

## 2023-07-01 LAB — GLUCOSE, CAPILLARY: Glucose-Capillary: 165 mg/dL — ABNORMAL HIGH (ref 70–99)

## 2023-07-01 NOTE — Progress Notes (Signed)
PCP - Dr. Lynnea Ferrier Cardiologist - Dr. Dina Rich  PPM/ICD - denies Device Orders - na Rep Notified - na  Chest x-ray - n/a EKG - PAT, 07/01/2023 Stress Test -  ECHO - 11/17/2020 Cardiac Cath - 07/12/2020  Sleep Study - denies CPAP - na  Type II diabetic: Diet controlled Fasting Blood Sugar - 110-120 Checks Blood Sugar daily.   Last dose of GLP1 agonist-  denies GLP1 instructions:   Blood Thinner Instructions: denies Aspirin Instructions:states last dose 06/26/2023  ERAS Protcol -Yes, until 0900 PRE-SURGERY Ensure or G2-   COVID TEST- n/a   Anesthesia review: Yes. DM, HTN, MI, CAD, CKD, CABG x 4  Patient denies shortness of breath, fever, cough and chest pain at PAT appointment   All instructions explained to the patient, with a verbal understanding of the material. Patient agrees to go over the instructions while at home for a better understanding. Patient also instructed to self quarantine after being tested for COVID-19. The opportunity to ask questions was provided.

## 2023-07-02 DIAGNOSIS — Z992 Dependence on renal dialysis: Secondary | ICD-10-CM | POA: Diagnosis not present

## 2023-07-02 DIAGNOSIS — Z23 Encounter for immunization: Secondary | ICD-10-CM | POA: Diagnosis not present

## 2023-07-02 DIAGNOSIS — N186 End stage renal disease: Secondary | ICD-10-CM | POA: Diagnosis not present

## 2023-07-02 NOTE — Anesthesia Preprocedure Evaluation (Addendum)
Anesthesia Evaluation  Patient identified by MRN, date of birth, ID band Patient awake    Reviewed: Allergy & Precautions, NPO status , Patient's Chart, lab work & pertinent test results  Airway Mallampati: II  TM Distance: >3 FB Neck ROM: Full    Dental  (+) Dental Advisory Given   Pulmonary former smoker   breath sounds clear to auscultation       Cardiovascular hypertension, Pt. on medications + CAD, + Past MI, + Cardiac Stents, + CABG and +CHF   Rhythm:Regular Rate:Normal  1. Left ventricular ejection fraction, by estimation, is 35 to 40%. The  left ventricle has normal function. The left ventricle demonstrates global  hypokinesis. Left ventricular diastolic parameters are consistent with  Grade I diastolic dysfunction  (impaired relaxation). Elevated left atrial pressure. The average left  ventricular global longitudinal strain is -13.8 %. The global longitudinal  strain is abnormal.   2. Right ventricular systolic function is mildly reduced. The right  ventricular size is normal.   3. Left atrial size was moderately dilated.   4. Right atrial size was mildly dilated.   5. The mitral valve is normal in structure. Mild mitral valve  regurgitation. No evidence of mitral stenosis.   6. The aortic valve is tricuspid. Aortic valve regurgitation is not  visualized. No aortic stenosis is present.   Comparison(s): Echocardiogram done 07/11/20 showed an EF of 25%     Neuro/Psych negative neurological ROS     GI/Hepatic Neg liver ROS,GERD  ,,  Endo/Other  diabetes, Type 2    Renal/GU DialysisRenal disease     Musculoskeletal   Abdominal   Peds  Hematology  (+) Blood dyscrasia, anemia   Anesthesia Other Findings   Reproductive/Obstetrics                              Lab Results  Component Value Date   WBC 8.4 07/01/2023   HGB 9.5 (L) 07/05/2023   HCT 28.0 (L) 07/05/2023   MCV 100.7  (H) 07/01/2023   PLT 203 07/01/2023   Lab Results  Component Value Date   NA 140 07/05/2023   CL 108 07/05/2023   K 4.5 07/05/2023   CO2 22 05/21/2023   BUN 51 (H) 07/05/2023   CREATININE 4.90 (H) 07/05/2023   EGFR 12 (L) 05/21/2023   CALCIUM 9.1 05/21/2023   PHOS 4.6 08/02/2020   ALBUMIN 3.3 (L) 05/28/2022   GLUCOSE 115 (H) 07/05/2023    Anesthesia Physical Anesthesia Plan  ASA: 4  Anesthesia Plan: General   Post-op Pain Management: Tylenol PO (pre-op)*   Induction: Intravenous  PONV Risk Score and Plan: 2 and Ondansetron and Dexamethasone  Airway Management Planned: Oral ETT  Additional Equipment: Arterial line  Intra-op Plan:   Post-operative Plan: Extubation in OR  Informed Consent: I have reviewed the patients History and Physical, chart, labs and discussed the procedure including the risks, benefits and alternatives for the proposed anesthesia with the patient or authorized representative who has indicated his/her understanding and acceptance.     Dental advisory given  Plan Discussed with: CRNA and Surgeon  Anesthesia Plan Comments: (PAT note by Antionette Poles, PA-C: 77 year old male follows with cardiology for history of CAD s/p CABG x 4, chronic combined heart failure/ischemic cardiomyopathy, carotid stenosis, frequent PVCs, HTN, HLD.  Last echo in 2022 showed improvement in EF to 35-40%.  Seen by Carlos Levering, NP 06/21/2023 for preop evaluation.  Per note, "Preoperative  cardiovascular risk assessment. Right parotidectomy with right modified radical neck dissection by Dr. Ernestene Kiel on 07/05/2023. Chart reviewed as part of pre-operative protocol coverage. According to the RCRI, patient has a 11% risk of MACE. Patient reports activity equivalent to >4.0 METS (yard work and light to moderate household activities). Given past medical history and time since last visit, based on ACC/AHA guidelines, Bradley Black would be at acceptable risk for the planned procedure  without further cardiovascular testing."   ESRD on peritoneal dialysis.  GERD continued on pantoprazole.  Diet controlled DM2.  CBC 07/01/2023 reviewed, stable chronic anemia with hemoglobin 9.8.  Patient will need day of surgery i-STAT.  EKG 07/01/2023: Sinus bradycardia.  Rate 56.  Inferior infarct , age undetermined. Cannot rule out Anterior infarct , age undetermined.  No significant change from prior.  Carotid duplex 08/09/2022: Summary:  Right Carotid: Velocities in the right ICA are consistent with a 1-39% stenosis. The ECA appears >50% stenosed.  Left Carotid: Velocities in the left ICA are consistent with a 40-59% stenosis. The ECA appears >50% stenosed. Upper end of scale.  Vertebrals:  Bilateral vertebral arteries demonstrate antegrade flow.  Subclavians: Normal flow hemodynamics were seen in bilateral subclavian arteries.   TTE 11/17/2020:  1. Left ventricular ejection fraction, by estimation, is 35 to 40%. The  left ventricle has normal function. The left ventricle demonstrates global  hypokinesis. Left ventricular diastolic parameters are consistent with  Grade I diastolic dysfunction  (impaired relaxation). Elevated left atrial pressure. The average left  ventricular global longitudinal strain is -13.8 %. The global longitudinal  strain is abnormal.   2. Right ventricular systolic function is mildly reduced. The right  ventricular size is normal.   3. Left atrial size was moderately dilated.   4. Right atrial size was mildly dilated.   5. The mitral valve is normal in structure. Mild mitral valve  regurgitation. No evidence of mitral stenosis.   6. The aortic valve is tricuspid. Aortic valve regurgitation is not  visualized. No aortic stenosis is present.   Comparison(s): Echocardiogram done 07/11/20 showed an EF of 25%.    )         Anesthesia Quick Evaluation

## 2023-07-02 NOTE — Progress Notes (Signed)
Anesthesia Chart Review:  77 year old male follows with cardiology for history of CAD s/p CABG x 4, chronic combined heart failure/ischemic cardiomyopathy, carotid stenosis, frequent PVCs, HTN, HLD.  Last echo in 2022 showed improvement in EF to 35-40%.  Seen by Carlos Levering, NP 06/21/2023 for preop evaluation.  Per note, "Preoperative cardiovascular risk assessment. Right parotidectomy with right modified radical neck dissection by Dr. Ernestene Kiel on 07/05/2023. Chart reviewed as part of pre-operative protocol coverage. According to the RCRI, patient has a 11% risk of MACE. Patient reports activity equivalent to >4.0 METS (yard work and light to moderate household activities). Given past medical history and time since last visit, based on ACC/AHA guidelines, Gabriela Awad Gladd would be at acceptable risk for the planned procedure without further cardiovascular testing."   ESRD on peritoneal dialysis.  GERD continued on pantoprazole.  Diet controlled DM2.  CBC 07/01/2023 reviewed, stable chronic anemia with hemoglobin 9.8.  Patient will need day of surgery i-STAT.  EKG 07/01/2023: Sinus bradycardia.  Rate 56.  Inferior infarct , age undetermined. Cannot rule out Anterior infarct , age undetermined.  No significant change from prior.  Carotid duplex 08/09/2022: Summary:  Right Carotid: Velocities in the right ICA are consistent with a 1-39% stenosis. The ECA appears >50% stenosed.  Left Carotid: Velocities in the left ICA are consistent with a 40-59% stenosis. The ECA appears >50% stenosed. Upper end of scale.  Vertebrals:  Bilateral vertebral arteries demonstrate antegrade flow.  Subclavians: Normal flow hemodynamics were seen in bilateral subclavian arteries.   TTE 11/17/2020:  1. Left ventricular ejection fraction, by estimation, is 35 to 40%. The  left ventricle has normal function. The left ventricle demonstrates global  hypokinesis. Left ventricular diastolic parameters are consistent with   Grade I diastolic dysfunction  (impaired relaxation). Elevated left atrial pressure. The average left  ventricular global longitudinal strain is -13.8 %. The global longitudinal  strain is abnormal.   2. Right ventricular systolic function is mildly reduced. The right  ventricular size is normal.   3. Left atrial size was moderately dilated.   4. Right atrial size was mildly dilated.   5. The mitral valve is normal in structure. Mild mitral valve  regurgitation. No evidence of mitral stenosis.   6. The aortic valve is tricuspid. Aortic valve regurgitation is not  visualized. No aortic stenosis is present.   Comparison(s): Echocardiogram done 07/11/20 showed an EF of 25%.     Zannie Cove [facility]/Anesthesiology Phone [phone] 07/02/2023 12:36 PM

## 2023-07-03 DIAGNOSIS — N186 End stage renal disease: Secondary | ICD-10-CM | POA: Diagnosis not present

## 2023-07-03 DIAGNOSIS — Z992 Dependence on renal dialysis: Secondary | ICD-10-CM | POA: Diagnosis not present

## 2023-07-03 DIAGNOSIS — Z23 Encounter for immunization: Secondary | ICD-10-CM | POA: Diagnosis not present

## 2023-07-04 DIAGNOSIS — I2510 Atherosclerotic heart disease of native coronary artery without angina pectoris: Secondary | ICD-10-CM | POA: Diagnosis not present

## 2023-07-04 DIAGNOSIS — Z23 Encounter for immunization: Secondary | ICD-10-CM | POA: Diagnosis not present

## 2023-07-04 DIAGNOSIS — Z992 Dependence on renal dialysis: Secondary | ICD-10-CM | POA: Diagnosis not present

## 2023-07-04 DIAGNOSIS — N185 Chronic kidney disease, stage 5: Secondary | ICD-10-CM | POA: Diagnosis not present

## 2023-07-04 DIAGNOSIS — N186 End stage renal disease: Secondary | ICD-10-CM | POA: Diagnosis not present

## 2023-07-04 DIAGNOSIS — I255 Ischemic cardiomyopathy: Secondary | ICD-10-CM | POA: Diagnosis not present

## 2023-07-04 DIAGNOSIS — C07 Malignant neoplasm of parotid gland: Secondary | ICD-10-CM | POA: Diagnosis not present

## 2023-07-05 ENCOUNTER — Encounter (HOSPITAL_COMMUNITY): Payer: Self-pay | Admitting: Otolaryngology

## 2023-07-05 ENCOUNTER — Encounter (HOSPITAL_COMMUNITY)
Admission: RE | Disposition: A | Discharge status: Home / Self Care | Payer: Self-pay | Source: Home / Self Care | Attending: Otolaryngology

## 2023-07-05 ENCOUNTER — Other Ambulatory Visit: Payer: Self-pay

## 2023-07-05 ENCOUNTER — Ambulatory Visit (HOSPITAL_COMMUNITY): Payer: Medicare Other | Admitting: Anesthesiology

## 2023-07-05 ENCOUNTER — Ambulatory Visit (HOSPITAL_COMMUNITY): Payer: Medicare Other | Admitting: Physician Assistant

## 2023-07-05 ENCOUNTER — Inpatient Hospital Stay (HOSPITAL_COMMUNITY)
Admission: RE | Admit: 2023-07-05 | Discharge: 2023-07-08 | DRG: 140 | Disposition: A | Discharge status: Home / Self Care | Payer: Medicare Other | Attending: Otolaryngology | Admitting: Otolaryngology

## 2023-07-05 DIAGNOSIS — H02203 Unspecified lagophthalmos right eye, unspecified eyelid: Secondary | ICD-10-CM | POA: Diagnosis present

## 2023-07-05 DIAGNOSIS — E11319 Type 2 diabetes mellitus with unspecified diabetic retinopathy without macular edema: Secondary | ICD-10-CM | POA: Diagnosis not present

## 2023-07-05 DIAGNOSIS — Z7982 Long term (current) use of aspirin: Secondary | ICD-10-CM | POA: Diagnosis not present

## 2023-07-05 DIAGNOSIS — Z79890 Hormone replacement therapy (postmenopausal): Secondary | ICD-10-CM | POA: Diagnosis not present

## 2023-07-05 DIAGNOSIS — I10 Essential (primary) hypertension: Secondary | ICD-10-CM | POA: Diagnosis not present

## 2023-07-05 DIAGNOSIS — C07 Malignant neoplasm of parotid gland: Principal | ICD-10-CM | POA: Diagnosis present

## 2023-07-05 DIAGNOSIS — I255 Ischemic cardiomyopathy: Secondary | ICD-10-CM | POA: Diagnosis present

## 2023-07-05 DIAGNOSIS — I5023 Acute on chronic systolic (congestive) heart failure: Secondary | ICD-10-CM | POA: Diagnosis present

## 2023-07-05 DIAGNOSIS — N400 Enlarged prostate without lower urinary tract symptoms: Secondary | ICD-10-CM | POA: Diagnosis present

## 2023-07-05 DIAGNOSIS — Z951 Presence of aortocoronary bypass graft: Secondary | ICD-10-CM

## 2023-07-05 DIAGNOSIS — G8929 Other chronic pain: Secondary | ICD-10-CM | POA: Diagnosis present

## 2023-07-05 DIAGNOSIS — I2510 Atherosclerotic heart disease of native coronary artery without angina pectoris: Secondary | ICD-10-CM | POA: Diagnosis not present

## 2023-07-05 DIAGNOSIS — M549 Dorsalgia, unspecified: Secondary | ICD-10-CM | POA: Diagnosis not present

## 2023-07-05 DIAGNOSIS — E1122 Type 2 diabetes mellitus with diabetic chronic kidney disease: Secondary | ICD-10-CM | POA: Diagnosis not present

## 2023-07-05 DIAGNOSIS — I132 Hypertensive heart and chronic kidney disease with heart failure and with stage 5 chronic kidney disease, or end stage renal disease: Secondary | ICD-10-CM | POA: Diagnosis present

## 2023-07-05 DIAGNOSIS — K118 Other diseases of salivary glands: Secondary | ICD-10-CM

## 2023-07-05 DIAGNOSIS — E8779 Other fluid overload: Secondary | ICD-10-CM | POA: Diagnosis not present

## 2023-07-05 DIAGNOSIS — K219 Gastro-esophageal reflux disease without esophagitis: Secondary | ICD-10-CM | POA: Diagnosis present

## 2023-07-05 DIAGNOSIS — Z992 Dependence on renal dialysis: Secondary | ICD-10-CM | POA: Diagnosis not present

## 2023-07-05 DIAGNOSIS — Z955 Presence of coronary angioplasty implant and graft: Secondary | ICD-10-CM

## 2023-07-05 DIAGNOSIS — N186 End stage renal disease: Secondary | ICD-10-CM | POA: Diagnosis not present

## 2023-07-05 DIAGNOSIS — Z87891 Personal history of nicotine dependence: Secondary | ICD-10-CM | POA: Diagnosis not present

## 2023-07-05 DIAGNOSIS — I252 Old myocardial infarction: Secondary | ICD-10-CM

## 2023-07-05 DIAGNOSIS — Z01818 Encounter for other preprocedural examination: Secondary | ICD-10-CM

## 2023-07-05 DIAGNOSIS — R7989 Other specified abnormal findings of blood chemistry: Secondary | ICD-10-CM | POA: Diagnosis not present

## 2023-07-05 DIAGNOSIS — Z79899 Other long term (current) drug therapy: Secondary | ICD-10-CM | POA: Diagnosis not present

## 2023-07-05 DIAGNOSIS — D631 Anemia in chronic kidney disease: Secondary | ICD-10-CM | POA: Diagnosis present

## 2023-07-05 DIAGNOSIS — E119 Type 2 diabetes mellitus without complications: Secondary | ICD-10-CM | POA: Diagnosis not present

## 2023-07-05 DIAGNOSIS — I120 Hypertensive chronic kidney disease with stage 5 chronic kidney disease or end stage renal disease: Secondary | ICD-10-CM | POA: Diagnosis not present

## 2023-07-05 DIAGNOSIS — R918 Other nonspecific abnormal finding of lung field: Secondary | ICD-10-CM | POA: Diagnosis not present

## 2023-07-05 DIAGNOSIS — E782 Mixed hyperlipidemia: Secondary | ICD-10-CM | POA: Diagnosis not present

## 2023-07-05 DIAGNOSIS — R079 Chest pain, unspecified: Secondary | ICD-10-CM | POA: Diagnosis not present

## 2023-07-05 DIAGNOSIS — D37030 Neoplasm of uncertain behavior of the parotid salivary glands: Secondary | ICD-10-CM

## 2023-07-05 DIAGNOSIS — I214 Non-ST elevation (NSTEMI) myocardial infarction: Secondary | ICD-10-CM | POA: Diagnosis not present

## 2023-07-05 DIAGNOSIS — Z23 Encounter for immunization: Secondary | ICD-10-CM | POA: Diagnosis not present

## 2023-07-05 HISTORY — PX: RADICAL NECK DISSECTION: SHX2284

## 2023-07-05 HISTORY — PX: PAROTIDECTOMY: SHX2163

## 2023-07-05 LAB — BASIC METABOLIC PANEL
Anion gap: 16 — ABNORMAL HIGH (ref 5–15)
BUN: 55 mg/dL — ABNORMAL HIGH (ref 8–23)
CO2: 15 mmol/L — ABNORMAL LOW (ref 22–32)
Calcium: 8.6 mg/dL — ABNORMAL LOW (ref 8.9–10.3)
Chloride: 108 mmol/L (ref 98–111)
Creatinine, Ser: 4.1 mg/dL — ABNORMAL HIGH (ref 0.61–1.24)
GFR, Estimated: 14 mL/min — ABNORMAL LOW (ref >60)
Glucose, Bld: 206 mg/dL — ABNORMAL HIGH (ref 70–99)
Potassium: 4.6 mmol/L (ref 3.5–5.1)
Sodium: 139 mmol/L (ref 135–145)

## 2023-07-05 LAB — CBC
HCT: 28.2 % — ABNORMAL LOW (ref 39.0–52.0)
Hemoglobin: 9.4 g/dL — ABNORMAL LOW (ref 13.0–17.0)
MCH: 33.7 pg (ref 26.0–34.0)
MCHC: 33.3 g/dL (ref 30.0–36.0)
MCV: 101.1 fL — ABNORMAL HIGH (ref 80.0–100.0)
Platelets: 166 10*3/uL (ref 150–400)
RBC: 2.79 MIL/uL — ABNORMAL LOW (ref 4.22–5.81)
RDW: 14.2 % (ref 11.5–15.5)
WBC: 10.5 10*3/uL (ref 4.0–10.5)
nRBC: 0 % (ref 0.0–0.2)

## 2023-07-05 LAB — POCT I-STAT, CHEM 8
BUN: 51 mg/dL — ABNORMAL HIGH (ref 8–23)
BUN: 50 mg/dL — ABNORMAL HIGH (ref 8–23)
BUN: 49 mg/dL — ABNORMAL HIGH (ref 8–23)
Calcium, Ion: 1.23 mmol/L (ref 1.15–1.40)
Calcium, Ion: 1.24 mmol/L (ref 1.15–1.40)
Calcium, Ion: 1.26 mmol/L (ref 1.15–1.40)
Chloride: 108 mmol/L (ref 98–111)
Chloride: 110 mmol/L (ref 98–111)
Chloride: 111 mmol/L (ref 98–111)
Creatinine, Ser: 4.9 mg/dL — ABNORMAL HIGH (ref 0.61–1.24)
Creatinine, Ser: 4.5 mg/dL — ABNORMAL HIGH (ref 0.61–1.24)
Creatinine, Ser: 4.5 mg/dL — ABNORMAL HIGH (ref 0.61–1.24)
Glucose, Bld: 115 mg/dL — ABNORMAL HIGH (ref 70–99)
Glucose, Bld: 170 mg/dL — ABNORMAL HIGH (ref 70–99)
Glucose, Bld: 187 mg/dL — ABNORMAL HIGH (ref 70–99)
HCT: 28 % — ABNORMAL LOW (ref 39.0–52.0)
HCT: 24 % — ABNORMAL LOW (ref 39.0–52.0)
HCT: 29 % — ABNORMAL LOW (ref 39.0–52.0)
Hemoglobin: 9.5 g/dL — ABNORMAL LOW (ref 13.0–17.0)
Hemoglobin: 8.2 g/dL — ABNORMAL LOW (ref 13.0–17.0)
Hemoglobin: 9.9 g/dL — ABNORMAL LOW (ref 13.0–17.0)
Potassium: 4.5 mmol/L (ref 3.5–5.1)
Potassium: 4.3 mmol/L (ref 3.5–5.1)
Potassium: 4.5 mmol/L (ref 3.5–5.1)
Sodium: 140 mmol/L (ref 135–145)
Sodium: 139 mmol/L (ref 135–145)
Sodium: 140 mmol/L (ref 135–145)
TCO2: 20 mmol/L — ABNORMAL LOW (ref 22–32)
TCO2: 20 mmol/L — ABNORMAL LOW (ref 22–32)
TCO2: 19 mmol/L — ABNORMAL LOW (ref 22–32)

## 2023-07-05 LAB — POCT I-STAT 7, (LYTES, BLD GAS, ICA,H+H)
Acid-base deficit: 9 mmol/L — ABNORMAL HIGH (ref 0.0–2.0)
Bicarbonate: 17.4 mmol/L — ABNORMAL LOW (ref 20.0–28.0)
Calcium, Ion: 1.24 mmol/L (ref 1.15–1.40)
HCT: 28 % — ABNORMAL LOW (ref 39.0–52.0)
Hemoglobin: 9.5 g/dL — ABNORMAL LOW (ref 13.0–17.0)
O2 Saturation: 97 %
Potassium: 4.5 mmol/L (ref 3.5–5.1)
Sodium: 141 mmol/L (ref 135–145)
TCO2: 19 mmol/L — ABNORMAL LOW (ref 22–32)
pCO2 arterial: 41.1 mm[Hg] (ref 32–48)
pH, Arterial: 7.234 — ABNORMAL LOW (ref 7.35–7.45)
pO2, Arterial: 105 mm[Hg] (ref 83–108)

## 2023-07-05 LAB — GLUCOSE, CAPILLARY
Glucose-Capillary: 128 mg/dL — ABNORMAL HIGH (ref 70–99)
Glucose-Capillary: 107 mg/dL — ABNORMAL HIGH (ref 70–99)
Glucose-Capillary: 192 mg/dL — ABNORMAL HIGH (ref 70–99)
Glucose-Capillary: 210 mg/dL — ABNORMAL HIGH (ref 70–99)

## 2023-07-05 LAB — PREPARE RBC (CROSSMATCH)

## 2023-07-05 SURGERY — EXCISION, PAROTID GLAND
Anesthesia: General | Site: Neck | Laterality: Right

## 2023-07-05 MED ORDER — DEXAMETHASONE SODIUM PHOSPHATE 10 MG/ML IJ SOLN
INTRAMUSCULAR | Status: DC | PRN
  Administered 2023-07-05: 10 mg via INTRAVENOUS

## 2023-07-05 MED ORDER — 0.9 % SODIUM CHLORIDE (POUR BTL) OPTIME
TOPICAL | Status: DC | PRN
  Administered 2023-07-05: 1000 mL

## 2023-07-05 MED ORDER — EPINEPHRINE PF 1 MG/ML IJ SOLN
INTRAMUSCULAR | Status: AC
  Filled 2023-07-05: qty 1

## 2023-07-05 MED ORDER — GENTAMICIN SULFATE 0.1 % EX CREA
1.0000 | TOPICAL_CREAM | Freq: Every day | CUTANEOUS | Status: DC
  Administered 2023-07-07 – 2023-07-08 (×2): 1 via TOPICAL
  Filled 2023-07-05: qty 15

## 2023-07-05 MED ORDER — LEVOTHYROXINE SODIUM 50 MCG PO TABS
50.0000 ug | ORAL_TABLET | Freq: Every day | ORAL | Status: DC
  Administered 2023-07-07 – 2023-07-08 (×2): 50 ug via ORAL
  Filled 2023-07-05 (×2): qty 1

## 2023-07-05 MED ORDER — LACTATED RINGERS IV SOLN: INTRAVENOUS | Status: DC

## 2023-07-05 MED ORDER — ROCURONIUM BROMIDE 10 MG/ML (PF) SYRINGE
PREFILLED_SYRINGE | INTRAVENOUS | Status: AC
  Filled 2023-07-05: qty 10

## 2023-07-05 MED ORDER — LIDOCAINE 2% (20 MG/ML) 5 ML SYRINGE
INTRAMUSCULAR | Status: DC | PRN
  Administered 2023-07-05: 60 mg via INTRAVENOUS

## 2023-07-05 MED ORDER — FENTANYL CITRATE (PF) 100 MCG/2ML IJ SOLN: 25.0000 ug | INTRAMUSCULAR | Status: DC | PRN

## 2023-07-05 MED ORDER — EPHEDRINE 5 MG/ML INJ
INTRAVENOUS | Status: AC
  Filled 2023-07-05: qty 5

## 2023-07-05 MED ORDER — PHENYLEPHRINE 80 MCG/ML (10ML) SYRINGE FOR IV PUSH (FOR BLOOD PRESSURE SUPPORT)
PREFILLED_SYRINGE | INTRAVENOUS | Status: DC | PRN
  Administered 2023-07-05: 80 ug via INTRAVENOUS
  Administered 2023-07-05 (×4): 160 ug via INTRAVENOUS
  Administered 2023-07-05: 80 ug via INTRAVENOUS

## 2023-07-05 MED ORDER — FENTANYL CITRATE (PF) 250 MCG/5ML IJ SOLN
INTRAMUSCULAR | Status: AC
  Filled 2023-07-05: qty 5

## 2023-07-05 MED ORDER — AMLODIPINE BESYLATE 5 MG PO TABS
5.0000 mg | ORAL_TABLET | Freq: Every day | ORAL | Status: DC
  Administered 2023-07-07 – 2023-07-08 (×2): 5 mg via ORAL
  Filled 2023-07-05 (×3): qty 1

## 2023-07-05 MED ORDER — SUCCINYLCHOLINE CHLORIDE 200 MG/10ML IV SOSY
PREFILLED_SYRINGE | INTRAVENOUS | Status: DC | PRN
  Administered 2023-07-05: 100 mg via INTRAVENOUS

## 2023-07-05 MED ORDER — SODIUM CHLORIDE 0.9 % IV SOLN: 10.0000 mL/h | Freq: Once | INTRAVENOUS | Status: DC

## 2023-07-05 MED ORDER — CEFAZOLIN SODIUM 1 G IJ SOLR
INTRAMUSCULAR | Status: AC
  Filled 2023-07-05: qty 20

## 2023-07-05 MED ORDER — TORSEMIDE 20 MG PO TABS
20.0000 mg | ORAL_TABLET | Freq: Every day | ORAL | Status: DC
  Administered 2023-07-07 – 2023-07-08 (×2): 20 mg via ORAL
  Filled 2023-07-05 (×3): qty 1

## 2023-07-05 MED ORDER — LIDOCAINE-EPINEPHRINE 1 %-1:100000 IJ SOLN
INTRAMUSCULAR | Status: AC
  Filled 2023-07-05: qty 1

## 2023-07-05 MED ORDER — ONDANSETRON HCL 4 MG/2ML IJ SOLN
INTRAMUSCULAR | Status: DC | PRN
Start: 2023-07-05 — End: 2023-07-05
  Administered 2023-07-05: 4 mg via INTRAVENOUS

## 2023-07-05 MED ORDER — SODIUM CHLORIDE 0.9 % IV SOLN
0.1500 ug/kg/min | INTRAVENOUS | Status: AC
  Administered 2023-07-05: .05 ug/kg/min via INTRAVENOUS
  Filled 2023-07-05: qty 2000

## 2023-07-05 MED ORDER — CEFAZOLIN SODIUM-DEXTROSE 2-4 GM/100ML-% IV SOLN
2.0000 g | INTRAVENOUS | Status: AC
  Administered 2023-07-05 (×2): 2 g via INTRAVENOUS

## 2023-07-05 MED ORDER — LIDOCAINE 2% (20 MG/ML) 5 ML SYRINGE
INTRAMUSCULAR | Status: AC
  Filled 2023-07-05: qty 5

## 2023-07-05 MED ORDER — PHENYLEPHRINE HCL-NACL 20-0.9 MG/250ML-% IV SOLN
INTRAVENOUS | Status: DC | PRN
  Administered 2023-07-05: 20 ug/min via INTRAVENOUS

## 2023-07-05 MED ORDER — DOXAZOSIN MESYLATE 2 MG PO TABS
2.0000 mg | ORAL_TABLET | Freq: Every day | ORAL | Status: DC
  Administered 2023-07-07: 2 mg via ORAL
  Filled 2023-07-05 (×3): qty 1

## 2023-07-05 MED ORDER — FENTANYL CITRATE (PF) 250 MCG/5ML IJ SOLN
INTRAMUSCULAR | Status: DC | PRN
  Administered 2023-07-05: 25 ug via INTRAVENOUS
  Administered 2023-07-05: 50 ug via INTRAVENOUS
  Administered 2023-07-05: 25 ug via INTRAVENOUS
  Administered 2023-07-05: 50 ug via INTRAVENOUS

## 2023-07-05 MED ORDER — PHENYLEPHRINE 80 MCG/ML (10ML) SYRINGE FOR IV PUSH (FOR BLOOD PRESSURE SUPPORT)
PREFILLED_SYRINGE | INTRAVENOUS | Status: AC
  Filled 2023-07-05: qty 10

## 2023-07-05 MED ORDER — BRIMONIDINE TARTRATE 0.2 % OP SOLN
1.0000 [drp] | Freq: Two times a day (BID) | OPHTHALMIC | Status: DC
  Administered 2023-07-06 – 2023-07-08 (×5): 1 [drp] via OPHTHALMIC
  Filled 2023-07-05: qty 5

## 2023-07-05 MED ORDER — SODIUM CHLORIDE 0.9 % IV SOLN: INTRAVENOUS | Status: DC

## 2023-07-05 MED ORDER — EPINEPHRINE HCL (NASAL) 0.1 % NA SOLN
NASAL | Status: AC
  Filled 2023-07-05: qty 30

## 2023-07-05 MED ORDER — SODIUM CHLORIDE 0.9 % IV SOLN
INTRAVENOUS | Status: DC | PRN
Start: 2023-07-05 — End: 2023-07-05

## 2023-07-05 MED ORDER — SODIUM CHLORIDE (PF) 0.9 % IJ SOLN
INTRAMUSCULAR | Status: DC | PRN
  Administered 2023-07-05: 15 mL

## 2023-07-05 MED ORDER — HYDRALAZINE HCL 25 MG PO TABS
75.0000 mg | ORAL_TABLET | Freq: Three times a day (TID) | ORAL | Status: DC
  Administered 2023-07-07 – 2023-07-08 (×4): 75 mg via ORAL
  Filled 2023-07-05 (×6): qty 3

## 2023-07-05 MED ORDER — CHLORHEXIDINE GLUCONATE 0.12 % MT SOLN: 15.0000 mL | Freq: Once | OROMUCOSAL | Status: AC

## 2023-07-05 MED ORDER — CEFAZOLIN SODIUM-DEXTROSE 2-4 GM/100ML-% IV SOLN
INTRAVENOUS | Status: AC
  Filled 2023-07-05: qty 100

## 2023-07-05 MED ORDER — CHLORHEXIDINE GLUCONATE 0.12 % MT SOLN
OROMUCOSAL | Status: AC
  Administered 2023-07-05: 15 mL via OROMUCOSAL
  Filled 2023-07-05: qty 15

## 2023-07-05 MED ORDER — SUCCINYLCHOLINE CHLORIDE 200 MG/10ML IV SOSY
PREFILLED_SYRINGE | INTRAVENOUS | Status: AC
  Filled 2023-07-05: qty 10

## 2023-07-05 MED ORDER — ALBUMIN HUMAN 5 % IV SOLN: INTRAVENOUS | Status: DC | PRN

## 2023-07-05 MED ORDER — PROPOFOL 10 MG/ML IV BOLUS
INTRAVENOUS | Status: AC
  Filled 2023-07-05: qty 20

## 2023-07-05 MED ORDER — ACETAMINOPHEN 500 MG PO TABS
1000.0000 mg | ORAL_TABLET | Freq: Four times a day (QID) | ORAL | Status: DC
  Administered 2023-07-06 – 2023-07-08 (×6): 1000 mg via ORAL
  Filled 2023-07-05 (×8): qty 2

## 2023-07-05 MED ORDER — KETOROLAC TROMETHAMINE 0.5 % OP SOLN
1.0000 [drp] | Freq: Three times a day (TID) | OPHTHALMIC | Status: DC
  Administered 2023-07-06 – 2023-07-08 (×6): 1 [drp] via OPHTHALMIC
  Filled 2023-07-05 (×2): qty 5

## 2023-07-05 MED ORDER — BACITRACIN ZINC 500 UNIT/GM EX OINT
TOPICAL_OINTMENT | CUTANEOUS | Status: DC | PRN
  Administered 2023-07-05: 1 via TOPICAL

## 2023-07-05 MED ORDER — DEXTROSE-SODIUM CHLORIDE 5-0.45 % IV SOLN: INTRAVENOUS | Status: DC

## 2023-07-05 MED ORDER — EPHEDRINE SULFATE-NACL 50-0.9 MG/10ML-% IV SOSY
PREFILLED_SYRINGE | INTRAVENOUS | Status: DC | PRN
  Administered 2023-07-05 (×6): 5 mg via INTRAVENOUS
  Administered 2023-07-05: 10 mg via INTRAVENOUS

## 2023-07-05 MED ORDER — SPIRONOLACTONE 12.5 MG HALF TABLET
12.5000 mg | ORAL_TABLET | Freq: Every day | ORAL | Status: DC
  Administered 2023-07-07 – 2023-07-08 (×2): 12.5 mg via ORAL
  Filled 2023-07-05 (×3): qty 1

## 2023-07-05 MED ORDER — DEXAMETHASONE SODIUM PHOSPHATE 10 MG/ML IJ SOLN
INTRAMUSCULAR | Status: AC
  Filled 2023-07-05: qty 1

## 2023-07-05 MED ORDER — GLYCOPYRROLATE PF 0.2 MG/ML IJ SOSY
PREFILLED_SYRINGE | INTRAMUSCULAR | Status: DC | PRN
Start: 2023-07-05 — End: 2023-07-05
  Administered 2023-07-05: .1 mg via INTRAVENOUS

## 2023-07-05 MED ORDER — VASOPRESSIN 20 UNIT/ML IV SOLN
INTRAVENOUS | Status: AC
  Filled 2023-07-05: qty 1

## 2023-07-05 MED ORDER — PHENYLEPHRINE HCL-NACL 20-0.9 MG/250ML-% IV SOLN
INTRAVENOUS | Status: AC
  Filled 2023-07-05: qty 250

## 2023-07-05 MED ORDER — PROPOFOL 10 MG/ML IV BOLUS
INTRAVENOUS | Status: DC | PRN
  Administered 2023-07-05: 50 mg via INTRAVENOUS
  Administered 2023-07-05: 150 mg via INTRAVENOUS

## 2023-07-05 MED ORDER — VASOPRESSIN 20 UNIT/ML IV SOLN
INTRAVENOUS | Status: DC | PRN
Start: 2023-07-05 — End: 2023-07-05
  Administered 2023-07-05 (×2): 2 [IU] via INTRAVENOUS
  Administered 2023-07-05 (×5): 1 [IU] via INTRAVENOUS

## 2023-07-05 MED ORDER — BACITRACIN ZINC 500 UNIT/GM EX OINT
TOPICAL_OINTMENT | CUTANEOUS | Status: AC
  Filled 2023-07-05: qty 28.35

## 2023-07-05 MED ORDER — BACITRACIN ZINC 500 UNIT/GM EX OINT
1.0000 | TOPICAL_OINTMENT | Freq: Three times a day (TID) | CUTANEOUS | Status: DC
  Administered 2023-07-06 – 2023-07-08 (×5): 1 via TOPICAL
  Filled 2023-07-05: qty 28.4

## 2023-07-05 MED ORDER — ONDANSETRON HCL 4 MG/2ML IJ SOLN
INTRAMUSCULAR | Status: AC
  Filled 2023-07-05: qty 2

## 2023-07-05 MED ORDER — ORAL CARE MOUTH RINSE: 15.0000 mL | Freq: Once | OROMUCOSAL | Status: AC

## 2023-07-05 MED ORDER — SACUBITRIL-VALSARTAN 97-103 MG PO TABS
1.0000 | ORAL_TABLET | Freq: Two times a day (BID) | ORAL | Status: DC
  Administered 2023-07-06 – 2023-07-08 (×4): 1 via ORAL
  Filled 2023-07-05 (×7): qty 1

## 2023-07-05 SURGICAL SUPPLY — 66 items
BLADE SURG 15 STRL LF DISP TIS (BLADE) ×1 IMPLANT
BLADE SURG 15 STRL SS (BLADE) ×1
CLIP TI MEDIUM 24 (CLIP) ×1 IMPLANT
CLIP TI WIDE RED SMALL 24 (CLIP) ×1 IMPLANT
CORD BIPOLAR FORCEPS 12FT (ELECTRODE) ×1 IMPLANT
COVER SURGICAL LIGHT HANDLE (MISCELLANEOUS) ×1 IMPLANT
DRAIN CHANNEL 15F RND FF W/TCR (WOUND CARE) IMPLANT
DRAIN CHANNEL 19F RND (DRAIN) IMPLANT
DRAIN JP 10F RND RADIO (DRAIN) IMPLANT
DRAIN JP 15F RND RADIO PRF (DRAIN) IMPLANT
DRAPE INCISE 13X13 STRL (DRAPES) IMPLANT
DRAPE INCISE 23X17 STRL (DRAPES) ×1 IMPLANT
DRAPE INCISE IOBAN 23X17 STRL (DRAPES) ×1
DRAPE UTILITY XL STRL (DRAPES) IMPLANT
DRSG TEGADERM 2-3/8X2-3/4 SM (GAUZE/BANDAGES/DRESSINGS) ×2 IMPLANT
DRSG TEGADERM 4X4.75 (GAUZE/BANDAGES/DRESSINGS) IMPLANT
ELECT COATED BLADE 2.86 ST (ELECTRODE) ×1 IMPLANT
ELECT PAIRED SUBDERMAL (MISCELLANEOUS) ×3
ELECT REM PT RETURN 9FT ADLT (ELECTROSURGICAL) ×1
ELECTRODE PAIRED SUBDERMAL (MISCELLANEOUS) ×1 IMPLANT
ELECTRODE REM PT RTRN 9FT ADLT (ELECTROSURGICAL) ×1 IMPLANT
EVACUATOR SILICONE 100CC (DRAIN) IMPLANT
GAUZE 4X4 16PLY ~~LOC~~+RFID DBL (SPONGE) IMPLANT
GLOVE BIO SURGEON STRL SZ7.5 (GLOVE) ×1 IMPLANT
GLOVE BIOGEL PI IND STRL 6.5 (GLOVE) IMPLANT
GLOVE BIOGEL PI IND STRL 8 (GLOVE) ×1 IMPLANT
GOWN STRL REUS W/ TWL LRG LVL3 (GOWN DISPOSABLE) ×1 IMPLANT
GOWN STRL REUS W/ TWL XL LVL3 (GOWN DISPOSABLE) ×1 IMPLANT
GOWN STRL REUS W/TWL LRG LVL3 (GOWN DISPOSABLE) ×2
GOWN STRL REUS W/TWL XL LVL3 (GOWN DISPOSABLE) ×1
GOWN STRL SURGICAL XL XLNG (GOWN DISPOSABLE) IMPLANT
HEMOSTAT SURGICEL 2X14 (HEMOSTASIS) IMPLANT
KIT BASIN OR (CUSTOM PROCEDURE TRAY) ×1 IMPLANT
KIT TURNOVER KIT B (KITS) ×1 IMPLANT
LOOP VASCULAR MINI 18 RED (MISCELLANEOUS) ×1
NDL FILTER BLUNT 18X1 1/2 (NEEDLE) ×1 IMPLANT
NDL PRECISIONGLIDE 27X1.5 (NEEDLE) ×1 IMPLANT
NEEDLE FILTER BLUNT 18X1 1/2 (NEEDLE) ×1
NEEDLE PRECISIONGLIDE 27X1.5 (NEEDLE) ×1
NS IRRIG 1000ML POUR BTL (IV SOLUTION) ×1 IMPLANT
PAD ARMBOARD 7.5X6 YLW CONV (MISCELLANEOUS) ×2 IMPLANT
PATTIES SURGICAL .5 X3 (DISPOSABLE) IMPLANT
PENCIL BUTTON HOLSTER BLD 10FT (ELECTRODE) IMPLANT
PENCIL SMOKE EVACUATOR (MISCELLANEOUS) ×1 IMPLANT
POSITIONER HEAD DONUT 9IN (MISCELLANEOUS) ×1 IMPLANT
PROBE NERVBE PRASS .33 (MISCELLANEOUS) ×1 IMPLANT
SET WALTER ACTIVATION W/DRAPE (SET/KITS/TRAYS/PACK) IMPLANT
SPONGE INTESTINAL PEANUT (DISPOSABLE) ×1 IMPLANT
SPONGE T-LAP 18X18 ~~LOC~~+RFID (SPONGE) ×1 IMPLANT
STAPLER VISISTAT 35W (STAPLE) ×1 IMPLANT
SUT ETHILON 3 0 PS 1 (SUTURE) IMPLANT
SUT ETHILON 5 0 PS 2 18 (SUTURE) ×1 IMPLANT
SUT ETHILON 6 0 9-3 1X18 BLK (SUTURE) ×1 IMPLANT
SUT SILK 2 0 SH CR/8 (SUTURE) ×1 IMPLANT
SUT SILK 3 0 REEL (SUTURE) ×1 IMPLANT
SUT SILK 3 0 SH 30 (SUTURE) IMPLANT
SUT SILK 3 0 SH CR/8 (SUTURE) ×1 IMPLANT
SUT SILK 4 0 REEL (SUTURE) IMPLANT
SUT VIC AB 3-0 SH 8-18 (SUTURE) ×1 IMPLANT
SUT VIC AB 4-0 PS2 18 (SUTURE) ×1 IMPLANT
SUT VIC AB 4-0 SH 18 (SUTURE) IMPLANT
SYR TB 1ML LUER SLIP (SYRINGE) ×1 IMPLANT
TOWEL GREEN STERILE FF (TOWEL DISPOSABLE) ×1 IMPLANT
TRAY ENT MC OR (CUSTOM PROCEDURE TRAY) ×1 IMPLANT
VASCULAR TIE MINI RED 18IN STL (MISCELLANEOUS) IMPLANT
WATER STERILE IRR 1000ML POUR (IV SOLUTION) ×1 IMPLANT

## 2023-07-05 NOTE — Consult Note (Signed)
Renal Service Consult Note [facility]  Bradley Black 07/05/2023 Bradley Krabbe, MD Requesting Physician: Dr. 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 year-old w/ PMH as below who presented for elective surgery today as above. Pt is on peritoneal dialysis and we are asked to see for dialysis.   Pt seen in pre-op. Wife at bedside. Pt started dialysis 4 yrs ago. His L forearm AVF never matured to be usable. He had catheter hemodialysis for a while then did CCPD.  Catheter has worked well, no recent issues.    ROS - denies CP, no joint pain, no HA, no blurry vision, no rash, no diarrhea, no nausea/ vomiting, no dysuria, no difficulty voiding   Past Medical History  Past Medical History:  Diagnosis Date   BPH (benign prostatic hyperplasia)    CAD S/P percutaneous coronary angioplasty    a. PTCA of OM 1988 & 1994 b. PCI with BMS to LAD in 1997 c. RCA PCI BMS 1999 & 2000 d. s/p CABG in 11/2011 with LIMA-LAD, SVG-PDA, SVG-OM2, and SVG-D1   Cataract    Chronic back pain    CKD (chronic kidney disease), stage III (HCC)    Cyst of bursa    R shoulder   Diabetic retinopathy (HCC)    DM (diabetes mellitus), type 2 with renal complications (HCC)    Essential hypertension    Frequent PVCs    GERD (gastroesophageal reflux disease)    Hypertensive retinopathy    Ischemic cardiomyopathy 11/2011   Intra-OP TEE: EF 40-45%, no regional WMA; improved Anterior WM post CABG.   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   LIMA to LAD, SVG to D1, SVG to OM2, SVG to PDA, EVH via right thigh and leg   Past Surgical History  Past Surgical History:  Procedure Laterality Date   AV FISTULA PLACEMENT Left 07/28/2020   Procedure: LEFT ARM ARTERIOVENOUS (AV) FISTULA  CREATION;  Surgeon: Bradley Earthly, MD;   Location: [facility];  Service: Vascular;  Laterality: Left;   BACK SURGERY  10/01/1968   BIOPSY  01/25/2022   Procedure: BIOPSY;  Surgeon: Bradley Ade, MD;  Location: [facility];  Service: Endoscopy;;   CARDIAC CATHETERIZATION  10/02/2011   CATARACT EXTRACTION W/PHACO Left 09/14/2022   Procedure: CATARACT EXTRACTION PHACO AND INTRAOCULAR LENS PLACEMENT (IOC);  Surgeon: Bradley Pierce, MD;  Location: [facility];  Service: Ophthalmology;  Laterality: Left;  CDE: 8.66   CATARACT EXTRACTION W/PHACO Right 09/28/2022   Procedure: CATARACT EXTRACTION PHACO AND INTRAOCULAR LENS PLACEMENT (IOC);  Surgeon: Bradley Pierce, MD;  Location: [facility];  Service: Ophthalmology;  Laterality: Right;  CDE 7.79   COLONOSCOPY WITH PROPOFOL N/A 01/25/2022   Procedure: COLONOSCOPY WITH PROPOFOL;  Surgeon: Bradley Ade, MD;  Location: [facility];  Service: Endoscopy;  Laterality: N/A;  10:30am   CORONARY ANGIOPLASTY  10/01/1992   OM   CORONARY ANGIOPLASTY  10/02/1995   LAD   CORONARY ANGIOPLASTY WITH STENT PLACEMENT  10/01/1997   RCA   CORONARY ANGIOPLASTY WITH STENT PLACEMENT  10/02/1999   RCA   CORONARY ARTERY BYPASS GRAFT  12/27/2011   Procedure: CORONARY ARTERY BYPASS GRAFTING (CABG);  Surgeon: Bradley Nails, MD;  [facility];  Service: Open Heart Surgery;  Laterality:  N/A;  Times four. On pump. Using endoscopically harvested right greater saphenous vein and left internal mammary artery.    ESOPHAGOGASTRODUODENOSCOPY (EGD) WITH PROPOFOL N/A 01/25/2022   Procedure: ESOPHAGOGASTRODUODENOSCOPY (EGD) WITH PROPOFOL;  Surgeon: Bradley Ade, MD;  [facility];  Service: Endoscopy;  Laterality: N/A;   HERNIA REPAIR Bilateral    INCISION AND DRAINAGE OF WOUND  10/01/2004   axilla   INSERTION OF DIALYSIS CATHETER Right 07/25/2020   Procedure: INSERTION OF DIALYSIS CATHETER;  Surgeon: Bradley Roers, MD;  Location: [facility];  Service: General;  Laterality: Right;   INTRAOPERATIVE  TRANSESOPHAGEAL ECHOCARDIOGRAM  12/27/2011   Global hypokinesis with EF of 40-45%, improved LAD distribution wall motion.   LEFT HEART CATHETERIZATION WITH CORONARY ANGIOGRAM N/A 12/13/2011   Procedure: LEFT HEART CATHETERIZATION WITH CORONARY ANGIOGRAM;  Surgeon: Bradley Lex, MD;  Location: [facility] CATH LAB;  Service: Cardiovascular;  Laterality: N/A;   LUMBAR FUSION     MASS EXCISION  10/24/2011   R arm   POLYPECTOMY  01/25/2022   Procedure: POLYPECTOMY;  Surgeon: Bradley Ade, MD;  [facility];  Service: Endoscopy;;   RIGHT HEART CATH N/A 07/12/2020   Procedure: RIGHT HEART CATH;  Surgeon: Bradley Records, MD;  Location: [facility] INVASIVE CV LAB;  Service: Cardiovascular;  Laterality: N/A;   TRANSESOPHAGEAL ECHOCARDIOGRAM  10/02/2011   Family History  Family History  Problem Relation Age of Onset   Cancer Mother        breast   Cancer Father        bone   Bradley Black cancer Neg Hx    Social History  reports that he quit smoking about 36 years ago. His smoking use included cigarettes. He started smoking about 51 years ago. He has a 15 pack-year smoking history. He has never used smokeless tobacco. He reports that he does not drink alcohol and does not use drugs. Allergies No Known Allergies Home medications Prior to Admission medications   Medication Sig Start Date End Date Taking? Authorizing Provider  amLODipine (NORVASC) 5 MG tablet Take 1 tablet (5 mg total) by mouth daily. 11/14/22  Yes Bradley Brooks, MD  aspirin EC 81 MG tablet Take 81 mg by mouth daily.   Yes [provider]  brimonidine (ALPHAGAN) 0.2 % ophthalmic solution Place 1 drop into both eyes 2 (two) times daily.   Yes [provider]  doxazosin (CARDURA) 1 MG tablet Take 2 mg by mouth daily. 04/25/21  Yes [provider]  ferrous sulfate 325 (65 FE) MG tablet Take 325 mg by mouth daily with breakfast.   Yes [provider]  hydrALAZINE (APRESOLINE) 50 MG tablet TAKE ONE AND  ONE-HALF TABLETS (75MG  TOTAL) BY MOUTH THREE TIMES DAILY 06/20/23  Yes Black, Bradley Pea, MD  ketorolac (ACULAR) 0.5 % ophthalmic solution Place 1 drop into both eyes in the morning, at noon, and at bedtime.   Yes [provider]  levothyroxine (SYNTHROID) 50 MCG tablet TAKE ONE TABLET ( TOTAL) BY MOUTH DAILY BEFORE BREAKFAST 05/30/23  Yes Bradley Brooks, MD  multivitamin (RENA-VIT) TABS tablet Take 1 tablet by mouth daily. 01/30/21  Yes [provider]  pantoprazole (PROTONIX) 40 MG tablet TAKE ONE TABLET BY MOUTH TWICE A DAY 06/28/23  Yes Bradley Brooks, MD  pravastatin (PRAVACHOL) 20 MG tablet TAKE ONE TABLET (20MG  TOTAL) BY MOUTH DAILY 02/21/23  Yes Black, Bradley Pea, MD  sacubitril-valsartan (ENTRESTO) 97-103 MG TAKE ONE TABLET BY MOUTH TWICE A  DAY 11/19/22  Yes Black, Bradley Pea, MD  sildenafil (VIAGRA) 100 MG tablet Take 0.5-1 tablets (50-100 mg total) by mouth daily as needed for erectile dysfunction. 06/12/22  Yes Bradley Brooks, MD  spironolactone (ALDACTONE) 25 MG tablet Take 0.5 tablets (12.5 mg total) by mouth daily. 01/24/23  Yes BranchDorothe Pea, MD  torsemide (DEMADEX) 20 MG tablet Take 20 mg by mouth daily. 03/29/21  Yes [provider]  Cholecalciferol (VITAMIN D3) 25 MCG (1000 UT) CAPS Take 1 capsule (1,000 Units total) by mouth daily at 6 (six) AM. 05/04/21   Netta Neat., NP  cyclobenzaprine (FLEXERIL) 5 MG tablet Take 1 tablet (5 mg total) by mouth 3 (three) times daily as needed for muscle spasms. Patient not taking: Reported on 06/19/2023 05/30/23   Bradley Brooks, MD     Vitals:   07/05/23 0952  BP: (!) 136/59  Pulse: 66  Resp: 17  Temp: 98 F (36.7 C)  TempSrc: Oral  SpO2: 98%  Weight: 64.4 kg  Height: 5\' 8"  (1.727 m)   Exam Gen alert, no distress No rash, cyanosis or gangrene Sclera anicteric, throat clear  No jvd or bruits Chest clear bilat to bases, no rales/ wheezing RRR no MRG Abd soft ntnd no mass or ascites  +bs GU wnl MS no joint effusions or deformity Ext no LE or UE edema, no wounds or ulcers Neuro is alert, Ox 3 , nf    PD cath mid abdomen , dressed      Renal-related home meds: - norvasc 5 - doxazosin 2 mg daily - hydralazine 75mg  tid - renavite - entresto bid - aldactone 12.5 daily - torsemide 20 mg daily - others: asa, synthroid, PPI, statin    OP PD: [facility]CPD patient at [facility]  5 exchanges , 2200 cc dwells, no daybag, 1.5h dwell   Dry wt 66kg       135/ 69, HR 80, RR 16     Na 140  K 4.5  BUN 51   creat 4.9   Hb 9.5  wBC 8K     Assessment/ Plan: 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 PD tonight.  HTN/ BP - stable BP pre op today. Takes 3 BP lowering meds + entresto + 2 diuretics at home.  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     Vinson Moselle  MD [facility] 07/05/2023, 11:06 AM  Recent Labs  Lab 07/01/23 1127 07/05/23 1035  HGB 9.8* 9.5*  CREATININE  --  4.90*  K  --  4.5   Inpatient medications:   sodium chloride 10 mL/hr at 07/05/23 1028   ceFAZolin      ceFAZolin (ANCEF) IV     ceFAZolin

## 2023-07-05 NOTE — H&P (Signed)
Bradley Black is an 77 y.o. male.    Chief Complaint:  Right parotid cancer  HPI: Patient presents today for planned elective procedure.  He/she denies any interval change in history since office visit on 07/05/23.   Past Medical History:  Diagnosis Date   BPH (benign prostatic hyperplasia)    CAD S/P percutaneous coronary angioplasty    a. PTCA of OM 1988 & 1994 b. PCI with BMS to LAD in 1997 c. RCA PCI BMS 1999 & 2000 d. s/p CABG in 11/2011 with LIMA-LAD, SVG-PDA, SVG-OM2, and SVG-D1   Cataract    Chronic back pain    CKD (chronic kidney disease), stage III (HCC)    Cyst of bursa    R shoulder   Diabetic retinopathy (HCC)    DM (diabetes mellitus), type 2 with renal complications (HCC)    Essential hypertension    Frequent PVCs    GERD (gastroesophageal reflux disease)    Hypertensive retinopathy    Ischemic cardiomyopathy 11/2011   Intra-OP TEE: EF 40-45%, no regional WMA; improved Anterior WM post CABG.   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   LIMA to LAD, SVG to D1, SVG to OM2, SVG to PDA, EVH via right thigh and leg    Past Surgical History:  Procedure Laterality Date   AV FISTULA PLACEMENT Left 07/28/2020   Procedure: LEFT ARM ARTERIOVENOUS (AV) FISTULA  CREATION;  Surgeon: Larina Earthly, MD;  Location: [facility];  Service: Vascular;  Laterality: Left;   BACK SURGERY  10/01/1968   BIOPSY  01/25/2022   Procedure: BIOPSY;  Surgeon: Corbin Ade, MD;  Location: [facility];  Service: Endoscopy;;   CARDIAC CATHETERIZATION  10/02/2011   CATARACT EXTRACTION W/PHACO Left 09/14/2022   Procedure: CATARACT EXTRACTION PHACO AND INTRAOCULAR LENS PLACEMENT (IOC);  Surgeon: Fabio Pierce, MD;  Location: [facility];  Service: Ophthalmology;  Laterality: Left;  CDE: 8.66   CATARACT EXTRACTION W/PHACO Right 09/28/2022   Procedure: CATARACT EXTRACTION PHACO AND INTRAOCULAR LENS  PLACEMENT (IOC);  Surgeon: Fabio Pierce, MD;  Location: [facility];  Service: Ophthalmology;  Laterality: Right;  CDE 7.79   COLONOSCOPY WITH PROPOFOL N/A 01/25/2022   Procedure: COLONOSCOPY WITH PROPOFOL;  Surgeon: Corbin Ade, MD;  Location: [facility];  Service: Endoscopy;  Laterality: N/A;  10:30am   CORONARY ANGIOPLASTY  10/01/1992   OM   CORONARY ANGIOPLASTY  10/02/1995   LAD   CORONARY ANGIOPLASTY WITH STENT PLACEMENT  10/01/1997   RCA   CORONARY ANGIOPLASTY WITH STENT PLACEMENT  10/02/1999   RCA   CORONARY ARTERY BYPASS GRAFT  12/27/2011   Procedure: CORONARY ARTERY BYPASS GRAFTING (CABG);  Surgeon: Purcell Nails, MD;  Location: [facility] OR;  Service: Open Heart Surgery;  Laterality: N/A;  Times four. On pump. Using endoscopically harvested right greater saphenous vein and left internal mammary artery.    ESOPHAGOGASTRODUODENOSCOPY (EGD) WITH PROPOFOL N/A 01/25/2022   Procedure: ESOPHAGOGASTRODUODENOSCOPY (EGD) WITH PROPOFOL;  Surgeon: Corbin Ade, MD;  [facility];  Service: Endoscopy;  Laterality: N/A;   HERNIA REPAIR Bilateral    INCISION AND DRAINAGE OF WOUND  10/01/2004   axilla   INSERTION OF DIALYSIS CATHETER Right 07/25/2020   Procedure: INSERTION OF DIALYSIS CATHETER;  Surgeon: Lucretia Roers, MD;  Location: [facility];  Service: General;  Laterality: Right;   INTRAOPERATIVE TRANSESOPHAGEAL  ECHOCARDIOGRAM  12/27/2011   Global hypokinesis with EF of 40-45%, improved LAD distribution wall motion.   LEFT HEART CATHETERIZATION WITH CORONARY ANGIOGRAM N/A 12/13/2011   Procedure: LEFT HEART CATHETERIZATION WITH CORONARY ANGIOGRAM;  Surgeon: Marykay Lex, MD;  Location: [facility] CATH LAB;  Service: Cardiovascular;  Laterality: N/A;   LUMBAR FUSION     MASS EXCISION  10/24/2011   R arm   POLYPECTOMY  01/25/2022   Procedure: POLYPECTOMY;  Surgeon: Corbin Ade, MD;  [facility]: [facility];  Service: Endoscopy;;   RIGHT HEART CATH N/A 07/12/2020   Procedure:  RIGHT HEART CATH;  Surgeon: Lyn Records, MD;  Location: [facility] INVASIVE CV LAB;  Service: Cardiovascular;  Laterality: N/A;   TRANSESOPHAGEAL ECHOCARDIOGRAM  10/02/2011    Family History  Problem Relation Age of Onset   Cancer Mother        breast   Cancer Father        bone   Antoney cancer Neg Hx     Social History:  reports that he quit smoking about 36 years ago. His smoking use included cigarettes. He started smoking about 51 years ago. He has a 15 pack-year smoking history. He has never used smokeless tobacco. He reports that he does not drink alcohol and does not use drugs.  Allergies: No Known Allergies  Medications Prior to Admission  Medication Sig Dispense Refill   amLODipine (NORVASC) 5 MG tablet Take 1 tablet (5 mg total) by mouth daily. 90 tablet 1   aspirin EC 81 MG tablet Take 81 mg by mouth daily.     brimonidine (ALPHAGAN) 0.2 % ophthalmic solution Place 1 drop into both eyes 2 (two) times daily.     doxazosin (CARDURA) 1 MG tablet Take 2 mg by mouth daily.     ferrous sulfate 325 (65 FE) MG tablet Take 325 mg by mouth daily with breakfast.     hydrALAZINE (APRESOLINE) 50 MG tablet TAKE ONE AND ONE-HALF TABLETS (75MG  TOTAL) BY MOUTH THREE TIMES DAILY 270 tablet 1   ketorolac (ACULAR) 0.5 % ophthalmic solution Place 1 drop into both eyes in the morning, at noon, and at bedtime.     levothyroxine (SYNTHROID) 50 MCG tablet TAKE ONE TABLET ( TOTAL) BY MOUTH DAILY BEFORE BREAKFAST 90 tablet 1   multivitamin (RENA-VIT) TABS tablet Take 1 tablet by mouth daily.     pantoprazole (PROTONIX) 40 MG tablet TAKE ONE TABLET BY MOUTH TWICE A DAY 180 tablet 0   pravastatin (PRAVACHOL) 20 MG tablet TAKE ONE TABLET (20MG  TOTAL) BY MOUTH DAILY 90 tablet 3   sacubitril-valsartan (ENTRESTO) 97-103 MG TAKE ONE TABLET BY MOUTH TWICE A DAY 60 tablet 3   sildenafil (VIAGRA) 100 MG tablet Take 0.5-1 tablets (50-100 mg total) by mouth daily as needed for erectile dysfunction. 60 tablet 1    spironolactone (ALDACTONE) 25 MG tablet Take 0.5 tablets (12.5 mg total) by mouth daily. 15 tablet 6   torsemide (DEMADEX) 20 MG tablet Take 20 mg by mouth daily.     Cholecalciferol (VITAMIN D3) 25 MCG (1000 UT) CAPS Take 1 capsule (1,000 Units total) by mouth daily at 6 (six) AM. 60 capsule 6   cyclobenzaprine (FLEXERIL) 5 MG tablet Take 1 tablet (5 mg total) by mouth 3 (three) times daily as needed for muscle spasms. (Patient not taking: Reported on 06/19/2023) 30 tablet 1    Results for orders placed or performed during the hospital encounter of 07/05/23 (from the past 48 hour(s))  Glucose,  capillary     Status: Abnormal   Collection Time: 07/05/23  9:54 AM  Result Value Ref Range   Glucose-Capillary 128 (H) 70 - 99 mg/dL    Comment: Glucose reference range applies only to samples taken after fasting for at least 8 hours.  I-STAT, chem 8     Status: Abnormal   Collection Time: 07/05/23 10:35 AM  Result Value Ref Range   Sodium 140 135 - 145 mmol/L   Potassium 4.5 3.5 - 5.1 mmol/L   Chloride 108 98 - 111 mmol/L   BUN 51 (H) 8 - 23 mg/dL   Creatinine, Ser 6.28 (H) 0.61 - 1.24 mg/dL   Glucose, Bld 315 (H) 70 - 99 mg/dL    Comment: Glucose reference range applies only to samples taken after fasting for at least 8 hours.   Calcium, Ion 1.23 1.15 - 1.40 mmol/L   TCO2 20 (L) 22 - 32 mmol/L   Hemoglobin 9.5 (L) 13.0 - 17.0 g/dL   HCT 17.6 (L) 16.0 - 73.7 %  Type and screen [facility]     Status: None (Preliminary result)   Collection Time: 07/05/23 12:00 PM  Result Value Ref Range   ABO/RH(D) PENDING    Antibody Screen PENDING    Sample Expiration      07/08/2023,2359 Performed at [facility], [address], [place], [place] [address]   Glucose, capillary     Status: Abnormal   Collection Time: 07/05/23 12:15 PM  Result Value Ref Range   Glucose-Capillary 107 (H) 70 - 99 mg/dL    Comment: Glucose reference range applies only to samples taken after fasting  for at least 8 hours.   No results found.  ROS: negative other than stated in HPI  Blood pressure (!) 136/59, pulse 66, temperature 98 F (36.7 C), temperature source Oral, resp. rate 17, height 5\' 8"  (1.727 m), weight 64.4 kg, SpO2 98%.  PHYSICAL EXAM: General: Resting comfortably in NAD  Lungs: Non-labored respiratinos  Studies Reviewed:  PET CT 06/27/23  IMPRESSION: 1. Peripherally hypermetabolic centrally necrotic anterior right parotid 2.9 cm mass, increased from 2.0 cm on 05/07/2023 neck CT, compatible with primary parotid malignancy. 2. No hypermetabolic locoregional adenopathy in the neck. No hypermetabolic distant metastatic disease. 3. Small volume ascites. Percutaneous dialysis catheter terminates in the left lower quadrant. 4. Chronic findings include: Mild prostatomegaly. Mild cardiomegaly. Mild left colonic diverticulosis. Aortic Atherosclerosis (ICD10-I70.0).     Electronically Signed   By: Delbert Phenix M.D.   On: 07/04/2023 08:42     Assessment/Plan Right parotid gland malignancy.  Proceed with right parotidectomy, right neck dissection, possible cervicofacial advancement flap in setting of skin resection.  Informed consent obtained. R/B/A discussed including risks of pain, bleeding, infection, scarring, numbness, Frey's syndrome, first bite syndrome, recurrent tumor, facial nerve weakness/paralysis (possible sacrifice), ear numbness, hematoma/seroma/abscess, injury to cranial nerves X, XI, XII, injury to great vessels in the neck, chyle leak, stroke, heart attack, need for further surgery, risks of anesthesia. Despite these risks the patient requested to proceed with surgery.  Consulted Nephrology (Dr. Delano Metz) pre-operatively with plans for peritoneal dialysis this evening after surgery.   Electronically signed by:  Scarlette Ar, MD  Staff Physician Facial Plastic & Reconstructive Surgery Otolaryngology - Head and Neck Surgery [facility]  [facility]  07/05/2023, 12:29 PM

## 2023-07-05 NOTE — Anesthesia Procedure Notes (Signed)
Arterial Line Insertion Start/End10/12/2022 11:40 AM, 07/05/2023 11:45 AM Performed by: Marcene Duos, MD, anesthesiologist  Patient location: Pre-op. Preanesthetic checklist: patient identified, IV checked, site marked, risks and benefits discussed, surgical consent, monitors and equipment checked, pre-op evaluation, timeout performed and anesthesia consent Lidocaine 1% used for infiltration Right, radial was placed Catheter size: 20 G Hand hygiene performed  and maximum sterile barriers used   Attempts: 1 Procedure performed without using ultrasound guided technique. Following insertion, dressing applied and Biopatch. Post procedure assessment: normal and unchanged  Patient tolerated the procedure well with no immediate complications.

## 2023-07-05 NOTE — Op Note (Signed)
OPERATIVE NOTE  Bradley Black Date/Time of Admission: 07/05/2023  9:34 AM  CSN: [id number];MRN:[id number] Attending Provider: Scarlette Ar, MD Room/Bed: MCPO/NONE DOB: 05-25-1946 Age: 77 y.o.   Pre-Op Diagnosis: Mass of right parotid gland  Post-Op Diagnosis: Mass of right parotid gland  Procedure: Right radical parotidectomy with wide local excision facial skin 3x3cm, dissection and preservation of facial nerve (CPT 42415, 11644) Right modified radical neck dissection 1-3 (CPT [phone]) Cervicofacial rotation advancement flap 10x10cm for closure of 3x3cm defect (CPT 14040)  Anesthesia: General  Surgeon(s): Mervin Kung, MD  Staff: Circulator: Jolinda Croak, RN; Tawny Hopping, RN Relief Circulator: Bonna Gains, RN Relief Scrub: Cathie Hoops Scrub Person: Dorris Carnes RN First Assistant: Jeronimo Greaves, RN  Implants: * No implants in log *  Specimens: ID Type Source Tests Collected by Time Destination  1 : Right facial skin margin 12-3 Tissue PATH ENT biopsy SURGICAL PATHOLOGY Scarlette Ar, MD 07/05/2023 1400   2 : Right facial skin margin 3-6 Tissue PATH ENT biopsy SURGICAL PATHOLOGY Scarlette Ar, MD 07/05/2023 1402   3 : Right facial skin margin 6-9 Tissue PATH ENT biopsy SURGICAL PATHOLOGY Scarlette Ar, MD 07/05/2023 1402   4 : Right facial skin margin 9-12 Tissue PATH ENT biopsy SURGICAL PATHOLOGY Scarlette Ar, MD 07/05/2023 1402   5 : Right auricular nerve margin Tissue PATH ENT biopsy SURGICAL PATHOLOGY Scarlette Ar, MD 07/05/2023 1425   6 : Right radical parotidectomy tumor Tissue PATH ENT biopsy SURGICAL PATHOLOGY Scarlette Ar, MD 07/05/2023 1730   7 : Right level 1 B neck dissection Tissue PATH ENT biopsy SURGICAL PATHOLOGY Scarlette Ar, MD 07/05/2023 1802   8 : Right level 1 A neck dissection Tissue PATH ENT biopsy SURGICAL PATHOLOGY Scarlette Ar, MD 07/05/2023 1806   9 : Right level 2 neck dissection Tissue PATH ENT biopsy SURGICAL  PATHOLOGY Scarlette Ar, MD 07/05/2023 1919   10 : Right level 3 neck dissection Tissue PATH ENT biopsy SURGICAL PATHOLOGY Scarlette Ar, MD 07/05/2023 1919   11 : Right external jugular Tissue PATH ENT biopsy SURGICAL PATHOLOGY Scarlette Ar, MD 07/05/2023 1919     Complications: none  EBL: 150 ML  IVF: Per anesthesia ML  Condition: stable  Operative Findings:  Right parotid malignant tumor with invasion of overlying skin and masseter muscle. Dissection and preservation of entire facial nerve. Normal stimulation of all distal branches with exception of frontal branch at end of procedure. CN X, XI, XII visualized and preserved   Indications for Procedure: Bradley Black is a 77 y/o M who presents with malignant neoplasm of the right parotid gland.   Description of Operation:  Once operative consent was obtained and the site and surgery were confirmed with the patient and the operating room team, the patient was brought back to the operating room and general endotracheal anesthesia was obtained. He was rotated to 180 degrees. The patient was turned over to the ENT service. The NIM facial nerve monitor was attached to the  frontalis, orbicularis oculi and orbicularis oris,and mentalis it was noted to be in good working condition.    A modified Blair incision with inferior extension (in case of cervicofacial rotational advancement flap) was planned on the right hand side and infiltrated with plain 1:100,000 epinephrine. The patient was prepped and draped in sterile fashion. Modified Blair incision was then made with a 15 blade. Superiorly, this incision was taken down in the subperichondrial plane, taken to the tragal cartilage until the tragal pointer was identified. Inferiorly,  in continuing the dissection, the external jugular vein was identified as well as the greater auricular nerve. The greater auricular nerve was divided with clips given nerve going directly into the tumor.  Frozen  control of the greater rectal nerve was confirmed.  We then proceeded with dissection in the sub-SMAS plane superior and inferior to where the tumor was.  This was initially done with a 15 blade and continued with Metzenbaum scissors.  Double-pronged skin X were applied.  Near the apex of the tumor there was obvious invasion of the dermis.  Therefore wide local excision of the malignant neoplasm involving skin 3 x 3 cm was performed.  Frozen control of the skin was achieved.  Next the sub-SMAS flap was carried out to the anterior aspect of the gland overlying the masseter muscle and inferiorly into the neck.  Cervical branches of the facial nerve were divided with clips.  The anterior border of the sternocleidomastoid muscle was identified and followed until the posterior border of digastric muscle was identified. The posterior digastric muscle was followed to its attachment to the mastoid bone. With superior and inferior dissection plane clearly delineated, which estimated the depth and location of the facial nerve, the facial nerve was identified at the exit from the stylomastoid foramen and traced to the upper and lower division. The mass was excised from the superficial parotid lobe with care taken to dissect free each branch of the facial nerve with careful dissection from inferior to superior (the tumor was released off the inferior division / marginal mandibular branch). The mass was freed from the facial nerve and excised with a margin of normal parotid tissue as well as masseter muscle anteriorly where it was h adherent.  During the posterior release of the tumor off the frontal division capsule was violated and a small amount of necrotic tumor extruded from the mass.  Once the specimen was removed then the face and neck was copiously irrigated with sterile water.  Due to the concern for high-grade parotid neoplasm we proceeded with a right sided modified radical neck dissection levels 1 through  3.  The sternocleidomastoid muscle was unzipped of the overlying fascia to the floor of the neck.  The spinal accessory nerve was identified superiorly dissected over the internal jugular vein.  The posterior belly digastric had been delineated marking the superior aspect of the dissection.  We then released perifacial lymph nodes below the marginal mandibular nerve.  The facial artery and vein were ligated with clips.  The submandibular triangle was dissected out bluntly and divided with bipolar cautery.  The submandibular gland was exposed and the mylohyoid muscle retracted anteriorly.  The submandibular duct and ganglion were divided with surgical clips.  Specimen was tethered out the facial artery which was once again divided with 3-0 silk ties.  Level 1B was sent off for permanent pathology including the perifacial lymph nodes.  A level 1A dissection was performed by retracting in the submental region and using a Bovie and forceps to remove the fibrofatty tissue believe between the bellies of the digastric atop the mylohyoid muscle and above the hyoid bone.  I then proceeded with a right level 2 and 3 neck dissection.  The level 2B neck dissection was performed dissecting out all fibrofatty tissue along the floor of the neck above the spine accessory nerve and lateral to the internal jugular vein.  The tissue was triangulated and delivered below the spinal accessory nerve.  There was an additional separate nerve branch  coming from the floor of the neck entering sternocleidomastoid muscle likely a trapezius branch that stimulated.  This was preserved.  We then released the fibrofatty lymph node packets of level 2-3 and a lateral to medial dissection until they were tethered off the internal jugular vein.  The vagus nerve was not left undisturbed.  The depth of the dissection was the cervical rootlets.  A medial to lateral dissection was performed releasing the fibrofatty tissue off the strap musculature and  reflecting it laterally over the internal jugular vein.  Small blood vessels were ligated with surgical clips.  The carotid artery was left undisturbed.  Next the specimen was released off the carotid sheath divided levels 2-3 and sent for permanent pathology.  Additionally the right external jugular vein was dissected posterior towards level 5 and the surrounding fibrofatty tissue sent out for external jugular lymph node specimen.  The this was ligated with a 3-0 silk tie.   Copious irrigation was placed into the wound and hemostasis was obtained. Facial nerve branches were examined and tested with the nerve monitor and intact with the exception of the frontal branch likely weak from neuropraxia all nerve branches were grossly preserved..  2 size 19 french suction drain was placed into the incision posteriorly in the right neck and secured with 3-0 silk; the superior drain light along the parotid bed and the inferior drain was in the lateral neck gutter.  Surgicel had been placed atop the facial nerve branches.   Next due to the 3 x 3 facial cheek defect a cervicofacial rotational advancement flap, adjacent tissue transfer for the 9 cm defect was performed with 10 x 10 cm cervicofacial rotational advancement flap and a sub-SMAS plane.  This was closed in and aesthetic fashion. The trifurcation and the cheek region was carefully closed with buried interrupted 4-0 Vicryl sutures and interrupted 5-0 nylon sutures.  The rest of the parotid incisions were closed with deep interrupted 3-0 Vicryl and 4-0 vicryl platysmal and deep dermal sutures and a combination of running and interrupted 5-0and 6-0 and nylon suture for the skin. Bacitracin was applied to the wound. The drain was placed on bulb suction. The patient was then turned over to the anesthesiologist. He was extubated in the operating room and sent to the PACU in stable condition. The patient will be admitted overnight for drain care and will proceed  with peritoneal dialysis tonight.  Plan: Admit for drain care Nightly peritoneal dialysis - managed by Nephrology Low threshold to consult medicine if patient develops labile blood pressure   Mervin Kung, MD [facility] ENT  07/05/2023

## 2023-07-05 NOTE — Discharge Instructions (Signed)
Bradley Black G. Bradley Rasor MD  HOME CARE INSTRUCTIONS FOLLOWING PAROTID GLAND SURGERY:  DIET:  - Patients who have received general anesthesia may experience nausea and occasionally, vomiting. It is therefore preferable to eat a bland light meal or a liquid diet on the first day after the surgery. Regular diet may be resumed the next day. Also, pain pills may cause nausea if taken on an empty stomach. It is preferable to take those pills with a piece of toast or some food. It is important to maintain hydration by drinking lots of fluids after the procedure.   ACTIVITY AND WOUND CARE:  - Elevate the head as much as possible. Sit in a recliner or use two or three pillows when sleeping in bed. Head elevation reduces bruising and swelling. Occasionally, you may notice that the bruises or swelling have migrated to other places (usually lower regions). You may have a dressing or your wound may be exposed.   - If your wound is not covered with a dressing keep the exposed wound dry. Avoid showers for the first 48 hours. After this you may shower, but avoid shower water directly hitting the surgical wound. If you do get the wound wet, pat dry the area immediately after your shower. If you have been instructed to use a topical antibiotic and have not received a prescription for antibiotic ointment, use over-the-counter triple antibiotic such as Neosporin. Apply a scanty amount on the suture line. At times, you may not see the sutures because they have been placed inside the wound.  MEDICATIONS:  - It is important to take all medications as prescribed by your surgeon. An antibiotic may be prescribed following the surgery. The patient also receives a prescription for narcotic pain killers. These products cause somnolence, drowsiness and constipation. Patients who take painkillers should not operate machinery, drive or make important decisions. - Do not use aspirin for 2 weeks; it increases the possibility of  bleeding.  WHAT TO EXPECT:  - For the first one to two weeks after surgery, there will be a mild to moderate amount of pain in the neck, side of the face, jaw, ear, or throat. This can be controlled with prescription pain medicine received at hospital discharge or Tylenol (acetaminophen). Ice may be applied to the affected area to help reduce pain and swelling. - You may have numbness, tingling and pain around the surgical site. This may last a long time but will become less noticeable with time.  - There may be discomfort during eating, and swallowing hard, crunchy foods may cause increased discomfort in the involved region. In time, this discomfort will improve. - Swelling in the surgical site is normal and will peak in 2-3 days. Call our office if the incision swells excessively, becomes increasingly red and or starts to drain. - If you experience dry eyes on the operative side, it is ok to use natural lubricating tears to keep the eye well moistened.   RESTRICTIONS:  - Bed rest and very light activity is the rule for the first 24 hours postoperatively. You may increase your activity level as necessary, but use common sense. - Refrain from vigorous activity for the 10 days after surgery. Do not lift objects weighing over 8-10 pounds (roughly the weight of a gallon of milk) for a minimum of 2 weeks following surgery.  - After surgery, you may shower below the neck. Avoid showering above the neck until at least 48 hours after surgery. We strongly discourage soaking the incision in   the bathtub, swimming pool, or hot tub until you have discussed this with your physician. - Driving is prohibited while taking prescription pain medicine. Additionally patients should not drive until for a minimum of 2 weeks (or as instructed by your physician) following surgery of the head and neck region. Surgery in this area may cause decreased range of motion making driving unsafe. Do not drive if you are unable to  look behind your shoulder comfortably.  WHAT ARE SOME REASONS TO CONTACT YOUR DOCTOR AFTER SURGERY?  - After parotid surgery there is a small risk for temporary or even permanent facial paralysis on the operative side. If you notice any increase in facial asymmetry after discharge, particularly with closing your eyes fully, or with an abnormal droop of the side of your mouth, please contact our office. - Some swelling around the incision is normal. However, if is any sudden significant increase in neck swelling, apply an ice pack to the neck and call our office. If this occurs after regular business hours, please call  [phone] and ask for the on call ENT resident. - If you develop and shortness of breath or difficulty, breathing please present to the nearest emergency department emergently for evaluation. - Many people will experience low-grade fevers after surgery that may persist for one to two days. Low grade fevers (less than 101 F) usually will resolve with Tylenol and fluids. If you have a high fever (greater than 101 F) that lasts longer than 24 hours without any improvement, please notify our service. - At any time during the postop period, please call the office if you have any questions or concerns about excessive bleeding, breathing difficulty, pain, persistent fever, nausea, swelling or other concerns that seem out of the ordinary from what you have discussed with your surgeon or read in this handout.  [facility] - [place] [address] , [place] [address]  Phone: [phone]    

## 2023-07-05 NOTE — Transfer of Care (Signed)
Immediate Anesthesia Transfer of Care Note  Patient: Bradley Black  Procedure(s) Performed: PAROTIDECTOMY (Right: Neck) MODIFIED RADICAL NECK DISSECTION (Right: Neck)  Patient Location: PACU  Anesthesia Type:General  Level of Consciousness:drowsy, responds to stimulation  Airway & Oxygen Therapy: Patient connected to face mask oxygen  Post-op Assessment: Report given to RN and Post -op Vital signs reviewed and stable  Post vital signs: Reviewed and stable  Last Vitals:  Vitals Value Taken Time  BP 129/57 07/05/23 2040  Temp    Pulse 99 07/05/23 2043  Resp 16 07/05/23 2043  SpO2 99 % 07/05/23 2043  Vitals shown include unfiled device data.  Last Pain:  Vitals:   07/05/23 1005  TempSrc:   PainSc: 4          Complications: No notable events documented.

## 2023-07-06 ENCOUNTER — Inpatient Hospital Stay (HOSPITAL_COMMUNITY): Payer: Medicare Other

## 2023-07-06 DIAGNOSIS — R079 Chest pain, unspecified: Secondary | ICD-10-CM | POA: Diagnosis not present

## 2023-07-06 DIAGNOSIS — Z951 Presence of aortocoronary bypass graft: Secondary | ICD-10-CM

## 2023-07-06 LAB — COMPREHENSIVE METABOLIC PANEL
ALT: 10 U/L (ref 0–44)
AST: 22 U/L (ref 15–41)
Albumin: 3.4 g/dL — ABNORMAL LOW (ref 3.5–5.0)
Alkaline Phosphatase: 62 U/L (ref 38–126)
Anion gap: 13 (ref 5–15)
BUN: 58 mg/dL — ABNORMAL HIGH (ref 8–23)
CO2: 18 mmol/L — ABNORMAL LOW (ref 22–32)
Calcium: 9.2 mg/dL (ref 8.9–10.3)
Chloride: 107 mmol/L (ref 98–111)
Creatinine, Ser: 4.44 mg/dL — ABNORMAL HIGH (ref 0.61–1.24)
GFR, Estimated: 13 mL/min — ABNORMAL LOW (ref >60)
Glucose, Bld: 193 mg/dL — ABNORMAL HIGH (ref 70–99)
Potassium: 4.8 mmol/L (ref 3.5–5.1)
Sodium: 138 mmol/L (ref 135–145)
Total Bilirubin: 1.2 mg/dL (ref 0.3–1.2)
Total Protein: 6.1 g/dL — ABNORMAL LOW (ref 6.5–8.1)

## 2023-07-06 LAB — TYPE AND SCREEN
ABO/RH(D): B POS
Antibody Screen: NEGATIVE
Unit division: 0

## 2023-07-06 LAB — BPAM RBC
Blood Product Expiration Date: 202410302359
ISSUE DATE / TIME: 202410041540
Unit Type and Rh: 7300

## 2023-07-06 LAB — TROPONIN I (HIGH SENSITIVITY)
Troponin I (High Sensitivity): 477 ng/L (ref <18)
Troponin I (High Sensitivity): 445 ng/L (ref <18)

## 2023-07-06 LAB — GLUCOSE, CAPILLARY
Glucose-Capillary: 197 mg/dL — ABNORMAL HIGH (ref 70–99)
Glucose-Capillary: 175 mg/dL — ABNORMAL HIGH (ref 70–99)
Glucose-Capillary: 145 mg/dL — ABNORMAL HIGH (ref 70–99)

## 2023-07-06 LAB — BRAIN NATRIURETIC PEPTIDE: B Natriuretic Peptide: 3042.8 pg/mL — ABNORMAL HIGH (ref 0.0–100.0)

## 2023-07-06 MED ORDER — HYDROCODONE-ACETAMINOPHEN 5-325 MG PO TABS: 1.0000 | ORAL_TABLET | ORAL | Status: DC | PRN

## 2023-07-06 MED ORDER — ASPIRIN 81 MG PO TBEC
81.0000 mg | DELAYED_RELEASE_TABLET | Freq: Every day | ORAL | Status: DC
  Administered 2023-07-06 – 2023-07-08 (×3): 81 mg via ORAL
  Filled 2023-07-06 (×3): qty 1

## 2023-07-06 MED ORDER — GENTAMICIN SULFATE 0.1 % EX CREA: 1.0000 | TOPICAL_CREAM | Freq: Every day | CUTANEOUS | Status: DC

## 2023-07-06 MED ORDER — ACETAMINOPHEN 10 MG/ML IV SOLN
1000.0000 mg | Freq: Four times a day (QID) | INTRAVENOUS | Status: DC
  Administered 2023-07-06 (×2): 1000 mg via INTRAVENOUS
  Filled 2023-07-06 (×2): qty 100

## 2023-07-06 MED ORDER — DELFLEX-LC/1.5% DEXTROSE 344 MOSM/L IP SOLN: INTRAPERITONEAL | Status: DC

## 2023-07-06 NOTE — Progress Notes (Signed)
Informed by nephrology RN that patient will not be finished with Pd until 5:45pm today. He has no signs of volume overload and Cr is in the 4's. Will hold PD tonight to avoid over dialyzing.   Rogers Blocker PA-C

## 2023-07-06 NOTE — Consult Note (Signed)
CARDIOLOGY CONSULT NOTE       Patient ID: Remberto Cindy Austgen MRN: [id number] DOB/AGE: 79-06-1946 79 y.o.  Admit date: 07/05/2023 Referring Physician: Marene Lenz Primary Physician: Donita Brooks, MD Primary Cardiologist: Dominga Ferry Reason for Consultation: Chest pain  Principal Problem:   Malignant neoplasm of parotid gland [facility])   HPI:  77 y.o. asked to see for post operative chest pain. History of distant CABG in 2013 after failed stenting LIMA to LAD, SVG to PDA, SVG OM2 and SVG D1. 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 Lives at home wife. Prior to admission stable with no angina. Does peritoneal dialysis himself at home for last 3 years. Has not had any recent stress testing but active at home with no angina. Had right radical parotidectomy with neck dissection yesterday. Last night had some "chest burning" Resolved now. This was not like his previous angina prior to CABG No associated dyspnea, palpitations. Currently stable with no chest pain and good hemodynamics having peritoneal dialysis now  ROS All other systems reviewed and negative except as noted above  Past Medical History:  Diagnosis Date   BPH (benign prostatic hyperplasia)    CAD S/P percutaneous coronary angioplasty    a. PTCA of OM 1988 & 1994 b. PCI with BMS to LAD in 1997 c. RCA PCI BMS 1999 & 2000 d. s/p CABG in 11/2011 with LIMA-LAD, SVG-PDA, SVG-OM2, and SVG-D1   Cataract    Chronic back pain    CKD (chronic kidney disease), stage III (HCC)    Cyst of bursa    R shoulder   Diabetic retinopathy (HCC)    DM (diabetes mellitus), type 2 with renal complications (HCC)    Essential hypertension    Frequent PVCs    GERD (gastroesophageal reflux disease)    Hypertensive retinopathy    Ischemic cardiomyopathy 11/2011   Intra-OP TEE: EF 40-45%, no regional WMA; improved Anterior WM post CABG.   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  Tobacco Use   Smoking status: Former    Current packs/day: 0.00    Average packs/day: 1 pack/day for 15.0 years (15.0 ttl pk-yrs)    Types: Cigarettes    Start date: 10/02/1971    Quit date: 10/01/1986    Years since quitting: 36.7   Smokeless tobacco: Never   Tobacco comments:    quit about 30 yrs ago  Vaping Use   Vaping status: Never Used  Substance and Sexual Activity   Alcohol use: No   Drug use: No   Sexual activity: Not Currently  Other Topics Concern   Not on file  Social History Narrative   Married,  father of one,  grandfather 1. Works out at [employer] at [employer] roughly 2-3 days a week. He works on a treadmill. He does note having a hard time getting his heart rate up.   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   He quit smoking in 1988, and does not drink alcohol.   Social Determinants of Health   Financial Resource Strain: Low Risk  (12/06/2022)   Overall Financial Resource Strain (CARDIA)    Difficulty of Paying Living Expenses: Not hard at all  Food Insecurity: Low Risk  (07/04/2023)   Received from [facility]   Hunger Vital Sign    Worried About Running Out of Food in the Last Year: Never true    Ran Out of Food in the Last Year: Never true  Transportation Needs: No Transportation Needs (07/04/2023)   Received from [facility]    In the past 12 months, has lack of  reliable transportation kept you from medical appointments, meetings, work or from getting things needed for daily living? : No  Physical Activity: Insufficiently Active (12/06/2022)   Exercise Vital Sign    Days of Exercise per Week: 3 days    Minutes of Exercise per Session: 30 min  Stress: No Stress Concern Present (12/06/2022)   [employer] of Occupational Health - Occupational Stress Questionnaire    Feeling of Stress : Not at all  Social Connections: Moderately Integrated (12/06/2022)   Social Connection and Isolation Panel [NHANES]    Frequency of Communication with Friends and Family: More than three times a week    Frequency of Social Gatherings with Friends and Family: More than three times a week    Attends Religious Services: 1 to 4 times per year    Active Member of [employer] or Organizations: No    Attends Banker Meetings: Never    Marital Status: Married  Catering manager Violence: Not At Risk (12/06/2022)   Humiliation, Afraid, Rape, and Kick questionnaire    Fear of Current or Ex-Partner: No    Emotionally Abused: No    Physically Abused: No    Sexually Abused: No    Past Surgical History:  Procedure Laterality Date   AV FISTULA PLACEMENT Left 07/28/2020   Procedure: LEFT ARM ARTERIOVENOUS (AV) FISTULA  CREATION;  Surgeon: Larina Earthly, MD;  Location: [facility];  Service: Vascular;  Laterality: Left;   BACK SURGERY  10/01/1968   BIOPSY  01/25/2022   Procedure: BIOPSY;  Surgeon: Corbin Ade, MD;  Location: [facility];  Service: Endoscopy;;   CARDIAC CATHETERIZATION  10/02/2011   CATARACT EXTRACTION W/PHACO Left 09/14/2022   Procedure: CATARACT EXTRACTION PHACO AND INTRAOCULAR LENS PLACEMENT (IOC);  Surgeon: Fabio Pierce, MD;  Location: [facility];  Service: Ophthalmology;  Laterality: Left;  CDE: 8.66   CATARACT EXTRACTION W/PHACO Right 09/28/2022   Procedure: CATARACT EXTRACTION PHACO AND INTRAOCULAR LENS PLACEMENT (IOC);  Surgeon: Fabio Pierce, MD;   Location: [facility];  Service: Ophthalmology;  Laterality: Right;  CDE 7.79   COLONOSCOPY WITH PROPOFOL N/A 01/25/2022   Procedure: COLONOSCOPY WITH PROPOFOL;  Surgeon: Corbin Ade, MD;  Location: [facility];  Service: Endoscopy;  Laterality: N/A;  10:30am   CORONARY ANGIOPLASTY  10/01/1992   OM   CORONARY ANGIOPLASTY  10/02/1995   LAD   CORONARY ANGIOPLASTY WITH STENT PLACEMENT  10/01/1997   RCA   CORONARY ANGIOPLASTY WITH STENT PLACEMENT  10/02/1999   RCA   CORONARY ARTERY  BYPASS GRAFT  12/27/2011   Procedure: CORONARY ARTERY BYPASS GRAFTING (CABG);  Surgeon: Purcell Nails, MD;  [facility];  Service: Open Heart Surgery;  Laterality: N/A;  Times four. On pump. Using endoscopically harvested right greater saphenous vein and left internal mammary artery.    ESOPHAGOGASTRODUODENOSCOPY (EGD) WITH PROPOFOL N/A 01/25/2022   Procedure: ESOPHAGOGASTRODUODENOSCOPY (EGD) WITH PROPOFOL;  Surgeon: Corbin Ade, MD;  [facility];  Service: Endoscopy;  Laterality: N/A;   HERNIA REPAIR Bilateral    INCISION AND DRAINAGE OF WOUND  10/01/2004   axilla   INSERTION OF DIALYSIS CATHETER Right 07/25/2020   Procedure: INSERTION OF DIALYSIS CATHETER;  Surgeon: Lucretia Roers, MD;  Location: [facility];  Service: General;  Laterality: Right;   INTRAOPERATIVE TRANSESOPHAGEAL ECHOCARDIOGRAM  12/27/2011   Global hypokinesis with EF of 40-45%, improved LAD distribution wall motion.   LEFT HEART CATHETERIZATION WITH CORONARY ANGIOGRAM N/A 12/13/2011   Procedure: LEFT HEART CATHETERIZATION WITH CORONARY ANGIOGRAM;  Surgeon: Marykay Lex, MD;  Location: [facility] CATH LAB;  Service: Cardiovascular;  Laterality: N/A;   LUMBAR FUSION     MASS EXCISION  10/24/2011   R arm   POLYPECTOMY  01/25/2022   Procedure: POLYPECTOMY;  Surgeon: Corbin Ade, MD;  [facility];  Service: Endoscopy;;   RIGHT HEART CATH N/A 07/12/2020   Procedure: RIGHT HEART CATH;  Surgeon: Lyn Records, MD;   Location: [facility];  Service: Cardiovascular;  Laterality: N/A;   TRANSESOPHAGEAL ECHOCARDIOGRAM  10/02/2011      Current Facility-Administered Medications:    0.9 %  sodium chloride infusion, , Intravenous, Continuous, Eilene Ghazi, MD, Last Rate: 150 mL/hr at 07/06/23 0607, Infusion Verify at 07/06/23 0607   0.9 %  sodium chloride infusion, 10 mL/hr, Intravenous, Once, Eulah Pont, CRNA   acetaminophen (TYLENOL) tablet 1,000 mg, 1,000 mg, Oral, Q6H, Skotnicki, Meghan A, DO   amLODipine (NORVASC) tablet 5 mg, 5 mg, Oral, Daily, Hoshal, Viviann Spare, MD   bacitracin ointment 1 Application, 1 Application, Topical, TID, Scarlette Ar, MD   brimonidine (ALPHAGAN) 0.2 % ophthalmic solution 1 drop, 1 drop, Both Eyes, BID, Hoshal, Steven, MD   dextrose 5 % and 0.45 % NaCl infusion, , Intravenous, Continuous, Scarlette Ar, MD   dialysis solution 1.5% low-MG/low-CA dianeal solution, , Intraperitoneal, Q24H, Collins, Samantha G, PA-C   doxazosin (CARDURA) tablet 2 mg, 2 mg, Oral, Daily, Hoshal, Viviann Spare, MD   gentamicin cream (GARAMYCIN) 0.1 % 1 Application, 1 Application, Topical, Daily, Delano Metz, MD   hydrALAZINE (APRESOLINE) tablet 75 mg, 75 mg, Oral, TID, Scarlette Ar, MD   HYDROcodone-acetaminophen (NORCO/VICODIN) 5-325 MG per tablet 1-2 tablet, 1-2 tablet, Oral, Q4H PRN, Skotnicki, Meghan A, DO   ketorolac (ACULAR) 0.5 % ophthalmic solution 1 drop, 1 drop, Both Eyes, TID, Scarlette Ar, MD   levothyroxine (SYNTHROID) tablet 50 mcg, 50 mcg, Oral, Q0600, Scarlette Ar, MD   sacubitril-valsartan (ENTRESTO) 97-103 mg per tablet, 1 tablet, Oral, BID, Scarlette Ar, MD   spironolactone (ALDACTONE) tablet 12.5 mg, 12.5 mg, Oral, Daily, Scarlette Ar, MD   torsemide (DEMADEX) tablet 20 mg, 20 mg, Oral, Daily, Scarlette Ar, MD  acetaminophen  1,000 mg Oral Q6H   amLODipine  5 mg Oral Daily   bacitracin  1 Application Topical TID   brimonidine  1 drop Both Eyes BID   doxazosin   2 mg Oral Daily   gentamicin cream  1 Application Topical Daily   hydrALAZINE  75 mg Oral TID  ketorolac  1 drop Both Eyes TID   levothyroxine  50 mcg Oral Q0600   sacubitril-valsartan  1 tablet Oral BID   spironolactone  12.5 mg Oral Daily   torsemide  20 mg Oral Daily    sodium chloride 150 mL/hr at 07/06/23 0607   sodium chloride     dextrose 5 % and 0.45 % NaCl     dialysis solution 1.5% low-MG/low-CA      Physical Exam: Blood pressure (!) 118/57, pulse 80, temperature (!) 97.5 F (36.4 C), temperature source Oral, resp. rate 15, height 5\' 8"  (1.727 m), weight 64.4 kg, SpO2 95%.    Right radical neck surgery  JP drain in place Basilar crackles laying flat No murmur  Prior sternotomy Peritoneal catheter in abdomen soft No edema  Labs:   Lab Results  Component Value Date   WBC 10.5 07/05/2023   HGB 9.4 (L) 07/05/2023   HCT 28.2 (L) 07/05/2023   MCV 101.1 (H) 07/05/2023   PLT 166 07/05/2023    Recent Labs  Lab 07/05/23 2100  NA 139  K 4.6  CL 108  CO2 15*  BUN 55*  CREATININE 4.10*  CALCIUM 8.6*  GLUCOSE 206*   No results found for: "CKTOTAL", "CKMB", "CKMBINDEX", "TROPONINI"  Lab Results  Component Value Date   CHOL 142 05/21/2023   CHOL 104 07/13/2020   CHOL 126 05/01/2019   Lab Results  Component Value Date   HDL 41 05/21/2023   HDL 25 (L) 07/13/2020   HDL 37 (L) 05/01/2019   Lab Results  Component Value Date   LDLCALC 83 05/21/2023   LDLCALC 67 07/13/2020   LDLCALC 73 05/01/2019   Lab Results  Component Value Date   TRIG 90 05/21/2023   TRIG 62 07/13/2020   TRIG 75 05/01/2019   Lab Results  Component Value Date   CHOLHDL 3.5 05/21/2023   CHOLHDL 4.2 07/13/2020   CHOLHDL 3.4 05/01/2019   No results found for: "LDLDIRECT"    Radiology: DG CHEST PORT 1 VIEW  Result Date: 07/06/2023 CLINICAL DATA:  Chest pain EXAM: PORTABLE CHEST - 1 VIEW COMPARISON:  05/28/2022 FINDINGS: Linear left perihilar scarring or atelectasis. Right lung  clear. No pneumothorax. Heart size and mediastinal contours are within normal limits. CABG markers, and surgical clips over the left hilum. No effusion. Sternotomy wires. IMPRESSION: Left perihilar scarring or atelectasis. Electronically Signed   By: Corlis Leak M.D.   On: 07/06/2023 09:55   NM PET Image Initial (PI) Skull Base To Thigh (F-18 FDG)  Result Date: 07/04/2023 CLINICAL DATA:  Initial treatment strategy for right parotid mass. EXAM: NUCLEAR MEDICINE PET SKULL BASE TO THIGH TECHNIQUE: 7.8 mCi F-18 FDG was injected intravenously. Full-ring PET imaging was performed from the skull base to thigh after the radiotracer. CT data was obtained and used for attenuation correction and anatomic localization. Fasting blood glucose: 95 mg/dl COMPARISON:  03/30/1600 neck CT FINDINGS: Mediastinal blood pool activity: SUV max 2.8 Liver activity: SUV max NA NECK: Peripherally hypermetabolic centrally necrotic anterior right parotid 2.9 x 2.8 cm mass with max SUV 5.8 (series 202/image 34), increased from 2.0 x 1.8 cm on 05/07/2023 neck CT. Otherwise no enlarged or hypermetabolic lymph nodes in the neck. Incidental CT findings: None. CHEST: No enlarged or hypermetabolic axillary, mediastinal or hilar lymph nodes. No hypermetabolic pulmonary findings. Incidental CT findings: Three-vessel coronary atherosclerosis status post CABG. Atherosclerotic nonaneurysmal thoracic aorta. Mild cardiomegaly. ABDOMEN/PELVIS: No abnormal hypermetabolic activity within the liver, pancreas, adrenal glands, or spleen.  No hypermetabolic lymph nodes in the abdomen or pelvis. Incidental CT findings: Atherosclerotic nonaneurysmal abdominal aorta. Simple left renal cysts, largest 2.4 cm in the interpolar left kidney, for which no follow-up imaging is recommended. Mild left colonic diverticulosis. Percutaneous dialysis catheter terminates in the left lower quadrant. Small volume ascites. Mild prostatomegaly. SKELETON: No focal hypermetabolic  activity to suggest skeletal metastasis. Incidental CT findings: Intact sternotomy wires. IMPRESSION: 1. Peripherally hypermetabolic centrally necrotic anterior right parotid 2.9 cm mass, increased from 2.0 cm on 05/07/2023 neck CT, compatible with primary parotid malignancy. 2. No hypermetabolic locoregional adenopathy in the neck. No hypermetabolic distant metastatic disease. 3. Small volume ascites. Percutaneous dialysis catheter terminates in the left lower quadrant. 4. Chronic findings include: Mild prostatomegaly. Mild cardiomegaly. Mild left colonic diverticulosis. Aortic Atherosclerosis (ICD10-I70.0). Electronically Signed   By: Delbert Phenix M.D.   On: 07/04/2023 08:42    EKG: SR no acute changes old IMI and poor R wave progression   ASSESSMENT AND PLAN:   Chest pain:  may not be angina ? GERD/indigestion Add protonix. Troponin pending Update TTE If troponin negative and pain does not recur would benefit from outpatient Lexiscan myovue given 77 yo grafts  Ischemic DCM:  getting peritoneal dialysis now Continue entresto, aldactone and demadex  HTN on norvasc Thyroid:  on synthroid replacement  Surgery:  parotid dissection advancing diet JP drain in per ENT    Signed: Charlton Haws 07/06/2023, 10:28 AM

## 2023-07-06 NOTE — Progress Notes (Signed)
   07/06/23 0746  Cycler Setup  Total Number of Night Cycles 5  Night Fill Volume 2200  Dianeal Solution Dextrose 1.5% in 6000 mL Low Cal/Low Mag  Night Dwell Time per Cycle - Hour(s) 1  Night Dwell Time per Cycle - Minute(s) 30  Night Time Therapy - Minute(s) 15  Night Time Therapy - Hour(s) 10  Minimum Initial Drain Volume 0  Maximum Peritoneal Volume 3300  Night/Total Therapy Volume 16109  Day Exchange No  Completion  Treatment Status Started   PD tx initation note     PD treatment initiated via aseptic technique. Consent signed and in chart. Patient is alert and oriented. No complaints of pain. PD exit site clean, dry and intact. Gentamycin and new dressing applied. Bedside RN educated on PD machine and how to contact tech support when PD machine alarms.    Electa Sniff Dialysis Nurse

## 2023-07-06 NOTE — Anesthesia Postprocedure Evaluation (Signed)
Anesthesia Post Note  Patient: Bradley Black  Procedure(s) Performed: 1. Right radical parotidectomy with wide local excision facial skin 3x3cm, dissection and preservation of facial nerve (CPT 42415, 11644) (Right: Neck) 2. Right modified radical neck dissection 1-3 (CPT [phone]) 3. Cervicofacial rotation advancement flap 10x10cm for closure of 3x3cm defect (CPT 14040) (Right: Neck)     Patient location during evaluation: PACU Anesthesia Type: General Level of consciousness: awake Pain management: pain level controlled Vital Signs Assessment: post-procedure vital signs reviewed and stable Respiratory status: spontaneous breathing, nonlabored ventilation and respiratory function stable Cardiovascular status: blood pressure returned to baseline and stable Postop Assessment: no apparent nausea or vomiting Anesthetic complications: no   No notable events documented.  Last Vitals:  Vitals:   07/06/23 0531 07/06/23 0600  BP: (!) 109/49 (!) 109/49  Pulse: 78 78  Resp: 12 19  Temp: 36.9 C 36.7 C  SpO2: 98% 96%    Last Pain:  Vitals:   07/06/23 0531  TempSrc: Oral  PainSc: 0-No pain                 Dejohn Ibarra P Burle Kwan

## 2023-07-06 NOTE — Progress Notes (Signed)
   07/06/23 1815  Completion  Treatment Status Complete  Initial Drain Volume 4  Average Dwell Time-Hour(s) 1  Average Dwell Time-Min(s) 30  Average Drain Time 22  Total Therapy Volume 01601  Total Therapy Time-Hour(s) 10  Total Therapy Time-Min(s) 33  Fluid Balance - CCPD  Total UF (- value on cycler, pt gain) -16 mL  Procedure Comments  Tolerated treatment well? Yes  Peritoneal Dialysis Comments Pt. tolerated procedure well without difficulties. Family remains at bedside  Education / Care Plan  Dialysis Education Provided Yes  Hand-off documentation  Hand-off Given Given to shift RN/LPN  Report given to (Full Name) Ladona Ridgel, LPN  Hand-off Received Received from shift RN/LPN  Report received from (Full Name) Delrae Sawyers RN   PD post treatment note   PD treatment completed. Patient tolerated treatment well. PD effluent is clear. No specimen collected.  PD exit site clean, dry and intact. Patient is awake, oriented and in no acute distress.  Report given to bedside nurse.   Post treatment VS: See FlowSheet  Total UF removed:  11000  Post treatment weight: 63.3 kg

## 2023-07-06 NOTE — Progress Notes (Signed)
On call provider made aware via secure chat and pager that pt cannot swallow pills d/t to pain. Night and early am meds held daughter at bedside decline PO meds d/t moderate sedation, pain when swallowing and risk for aspiration. Pt also verbalized pain on chest and  severe headache,  new orders for EKG, cont pulse ox, and IV tylenol, DO was called back to view EKG. No further instructions per DO

## 2023-07-06 NOTE — Progress Notes (Addendum)
Notified of troponin 477, called back to floor but then just rings on phone transferred. Discussed with Dr. Eden Emms who evaluated this patient; he indicates no change in plan; he would not start heparin/change meds at this time given recent neck dissection. Was able to connect with nurse about plan of care.

## 2023-07-06 NOTE — Progress Notes (Addendum)
ENT PROGRESS NOTE   Subjective: Patient seen and examined at bedside. Overnight, patient did not tolerate PO intake and required IV pain medicine. Patient also reported chest pain/burning overnight, at which time EKG was ordered. States that this has resolved and he feels stable this AM. Would like to eat.   Objective: Vital signs in last 24 hours: Temp:  [97.5 F (36.4 C)-98.4 F (36.9 C)] 97.5 F (36.4 C) (10/05 0855) Pulse Rate:  [65-101] 80 (10/05 0855) Resp:  [12-20] 15 (10/05 0855) BP: (107-136)/(48-59) 118/57 (10/05 0855) SpO2:  [93 %-99 %] 95 % (10/05 0855) Weight:  [64.4 kg] 64.4 kg (10/04 0952)  Drain output 12 hours:  Lateral Neck: 22cc Posterior Neck: 35cc  CONSTITUTIONAL: well developed, nourished, no distress and alert and oriented x 3 PULMONARY/CHEST WALL: effort normal and no stridor, no stertor, no dysphonia HENT: Head : normocephalic and atraumatic Nose: nose normal and no purulence Mouth/Throat:  Mouth: uvula midline and no oral lesions Throat: oropharynx clear and moist Mucous membranes: normal EYES: conjunctiva normal, EOM normal  NECK: Right neck incision C/D/I. Drains holding charge with sanguinous drainage. No evidence of seroma or hematoma. NEURO: CN II-XII intact   Recent Labs    07/05/23 1805 07/05/23 1900 07/05/23 2100  NA 140 141 139  K 4.5 4.5 4.6  CL 111  --  108  CO2  --   --  15*  GLUCOSE 187*  --  206*  BUN 49*  --  55*  CREATININE 4.50*  --  4.10*  CALCIUM  --   --  8.6*    Medications: I have reviewed the patient's current medications.  New Imaging: None  Pathology: Pending  Assessment/Plan: Bradley Black is a 77 y/o M POD #1 s/p right radical parotidectomy with excision of facial skin and right modified radical neck dissection and cervicofacial rotation advancement flap -Cardiology consulted this AM due to new changes noted on EKG and history of ACS. Cardiac enzymes, BNP, CXR, and repeat EKG ordered, appreciate cardiology  assistance. -Continue PD as per nephrology -Continue JP drains to bulb suction -Advance diet as tolerated -Ambulate with assist   LOS: 1 day    Laren Boom, DO [facility] 07/06/2023, 9:09 AM

## 2023-07-06 NOTE — Progress Notes (Signed)
[facility] Progress Note   Subjective:   Pt seen in room, reports PD was not started until this AM  because he got back from surgery late. He denies SOB, CP, dizziness, abdominal pain and nausea. Reports he has been doing PD for 3 years with no issues.   Objective Vitals:   07/05/23 2207 07/06/23 0000 07/06/23 0531 07/06/23 0600  BP: (!) 107/50 (!) 122/49 (!) 109/49 (!) 109/49  Pulse: 93 65 78 78  Resp: 20  12 19   Temp: 98.2 F (36.8 C)  98.4 F (36.9 C) 98 F (36.7 C)  TempSrc: Oral  Oral   SpO2: 93%  98% 96%  Weight:      Height:       Physical Exam General: Alert, elderly male in NAD Heart: RRR, no murmurs, rubs or gallops Lungs: CTA bilaterally, respirations unlabored Abdomen: Soft, non-distended, +BS Extremities: No edema b/l lower extremities Dialysis Access:  PD cath attached to cycler  Additional Objective Labs: Basic Metabolic Panel: Recent Labs  Lab 07/05/23 1530 07/05/23 1805 07/05/23 1900 07/05/23 2100  NA 139 140 141 139  K 4.3 4.5 4.5 4.6  CL 110 111  --  108  CO2  --   --   --  15*  GLUCOSE 170* 187*  --  206*  BUN 50* 49*  --  55*  CREATININE 4.50* 4.50*  --  4.10*  CALCIUM  --   --   --  8.6*   Liver Function Tests: No results for input(s): "AST", "ALT", "ALKPHOS", "BILITOT", "PROT", "ALBUMIN" in the last 168 hours. No results for input(s): "LIPASE", "AMYLASE" in the last 168 hours. CBC: Recent Labs  Lab 07/01/23 1127 07/05/23 1035 07/05/23 1805 07/05/23 1900 07/05/23 2100  WBC 8.4  --   --   --  10.5  HGB 9.8*   < > 9.9* 9.5* 9.4*  HCT 30.4*   < > 29.0* 28.0* 28.2*  MCV 100.7*  --   --   --  101.1*  PLT 203  --   --   --  166   < > = values in this interval not displayed.   Blood Culture    Component Value Date/Time   SDES LEFT ANTECUBITAL AEROBIC BOTTLE ONLY 05/28/2022 1212   SPECREQUEST Blood Culture adequate volume 05/28/2022 1212   CULT  05/28/2022 1212    NO GROWTH 5 DAYS Performed at [facility],  [address], [place], [place] [address]    REPTSTATUS 06/02/2022 FINAL 05/28/2022 1212    Cardiac Enzymes: No results for input(s): "CKTOTAL", "CKMB", "CKMBINDEX", "TROPONINI" in the last 168 hours. CBG: Recent Labs  Lab 07/01/23 1034 07/05/23 0954 07/05/23 1215 07/05/23 2045 07/05/23 2207  GLUCAP 165* 128* 107* 192* 210*   Iron Studies: No results for input(s): "IRON", "TIBC", "TRANSFERRIN", "FERRITIN" in the last 72 hours. @lablastinr3 @ Studies/Results: No results found. Medications:  sodium chloride 150 mL/hr at 07/06/23 5284   sodium chloride     acetaminophen 1,000 mg (07/06/23 0828)   dextrose 5 % and 0.45 % NaCl      acetaminophen  1,000 mg Oral Q6H   amLODipine  5 mg Oral Daily   bacitracin  1 Application Topical TID   brimonidine  1 drop Both Eyes BID   doxazosin  2 mg Oral Daily   gentamicin cream  1 Application Topical Daily   hydrALAZINE  75 mg Oral TID   ketorolac  1 drop Both Eyes TID   levothyroxine  50 mcg  Oral Q0600   sacubitril-valsartan  1 tablet Oral BID   spironolactone  12.5 mg Oral Daily   torsemide  20 mg Oral Daily    Dialysis Orders: CCPD patient at [facility]  5 exchanges , 2200 cc dwells, no daybag, 1.5h dwell   Dry wt 66kg        135/ 69, HR 80, RR 16     Na 140  K 4.5  BUN 51   creat 4.9   Hb 9.5  wBC 8K    Assessment/Plan: 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 PD delayed to this AM because he got back from surgery late. Will resume regular PD tonight.  HTN/ BP - stable BP. 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   Rogers Blocker, PA-C 07/06/2023, 8:39 AM  [facility] Pager: [phone]

## 2023-07-06 NOTE — Plan of Care (Signed)

## 2023-07-06 NOTE — Plan of Care (Signed)
  Problem: Activity: Goal: Risk for activity intolerance will decrease Outcome: Progressing   Problem: Coping: Goal: Level of anxiety will decrease Outcome: Progressing   Problem: Skin Integrity: Goal: Risk for impaired skin integrity will decrease Outcome: Progressing   

## 2023-07-06 NOTE — Progress Notes (Signed)
Noted troponin elevation 477->455. ECG old IMI poor R wave progression. No chest pain this am. Would not start heparin with large neck dissection still with JP drain in. Start ASA 81 mg continue other meds as BP tolerates. Will old grafts may need diagnostic cath in near future but prefer to do non acutely if possible to avoid bleeding issues. Any intervention to old SVG would involve a lot of blood thinners   Charlton Haws MD [facility]

## 2023-07-07 ENCOUNTER — Inpatient Hospital Stay (HOSPITAL_COMMUNITY): Payer: Medicare Other

## 2023-07-07 ENCOUNTER — Encounter (HOSPITAL_COMMUNITY): Payer: Self-pay | Admitting: Otolaryngology

## 2023-07-07 DIAGNOSIS — I214 Non-ST elevation (NSTEMI) myocardial infarction: Secondary | ICD-10-CM | POA: Diagnosis not present

## 2023-07-07 DIAGNOSIS — R079 Chest pain, unspecified: Secondary | ICD-10-CM | POA: Diagnosis not present

## 2023-07-07 DIAGNOSIS — R7989 Other specified abnormal findings of blood chemistry: Secondary | ICD-10-CM | POA: Diagnosis not present

## 2023-07-07 DIAGNOSIS — I255 Ischemic cardiomyopathy: Secondary | ICD-10-CM

## 2023-07-07 DIAGNOSIS — Z951 Presence of aortocoronary bypass graft: Secondary | ICD-10-CM | POA: Diagnosis not present

## 2023-07-07 LAB — ECHOCARDIOGRAM COMPLETE
Area-P 1/2: 4.89 cm2
Height: 68 in
S' Lateral: 4.2 cm
Single Plane A4C EF: 43.8 %
Weight: 2169.33 [oz_av]

## 2023-07-07 MED ORDER — CHLORHEXIDINE GLUCONATE CLOTH 2 % EX PADS
6.0000 | MEDICATED_PAD | Freq: Every day | CUTANEOUS | Status: DC
  Administered 2023-07-08: 6 via TOPICAL

## 2023-07-07 NOTE — Progress Notes (Signed)
[facility] Progress Note   Subjective:   Pt reports he is hungry this AM, otherwise no concerns. Reports he has not had any recurrence of chest pain. Denies SOB, dizziness, abdominal pain, edema.   Objective Vitals:   07/06/23 1815 07/06/23 2040 07/07/23 0600 07/07/23 0753  BP: (!) 125/55 134/61 (!) 148/74 (!) 143/80  Pulse: 78 72 95 82  Resp: 20 18 20 18   Temp: 98.9 F (37.2 C) 97.8 F (36.6 C) 97.9 F (36.6 C) 98 F (36.7 C)  TempSrc: Axillary Oral Oral Oral  SpO2: 96% 92% 93%   Weight: 61.5 kg     Height:       Physical Exam General: Alert, elderly male in NAD Heart: RRR, no murmurs, rubs or gallops Lungs: CTA bilaterally, respirations unlabored Abdomen: Soft, non-distended, +BS Extremities: No edema b/l lower extremities Dialysis Access:  PD cath in abdomen, no surrounding  TTP    Additional Objective Labs: Basic Metabolic Panel: Recent Labs  Lab 07/05/23 1805 07/05/23 1900 07/05/23 2100 07/06/23 0951  NA 140 141 139 138  K 4.5 4.5 4.6 4.8  CL 111  --  108 107  CO2  --   --  15* 18*  GLUCOSE 187*  --  206* 193*  BUN 49*  --  55* 58*  CREATININE 4.50*  --  4.10* 4.44*  CALCIUM  --   --  8.6* 9.2   Liver Function Tests: Recent Labs  Lab 07/06/23 0951  AST 22  ALT 10  ALKPHOS 62  BILITOT 1.2  PROT 6.1*  ALBUMIN 3.4*   No results for input(s): "LIPASE", "AMYLASE" in the last 168 hours. CBC: Recent Labs  Lab 07/01/23 1127 07/05/23 1035 07/05/23 1805 07/05/23 1900 07/05/23 2100  WBC 8.4  --   --   --  10.5  HGB 9.8*   < > 9.9* 9.5* 9.4*  HCT 30.4*   < > 29.0* 28.0* 28.2*  MCV 100.7*  --   --   --  101.1*  PLT 203  --   --   --  166   < > = values in this interval not displayed.   Blood Culture    Component Value Date/Time   SDES LEFT ANTECUBITAL AEROBIC BOTTLE ONLY 05/28/2022 1212   SPECREQUEST Blood Culture adequate volume 05/28/2022 1212   CULT  05/28/2022 1212    NO GROWTH 5 DAYS Performed at [facility], [address] [place], [place] [address]    REPTSTATUS 06/02/2022 FINAL 05/28/2022 1212    Cardiac Enzymes: No results for input(s): "CKTOTAL", "CKMB", "CKMBINDEX", "TROPONINI" in the last 168 hours. CBG: Recent Labs  Lab 07/05/23 2045 07/05/23 2207 07/06/23 0856 07/06/23 1220 07/06/23 1714  GLUCAP 192* 210* 197* 175* 145*   Iron Studies: No results for input(s): "IRON", "TIBC", "TRANSFERRIN", "FERRITIN" in the last 72 hours. @lablastinr3 @ Studies/Results: DG CHEST PORT 1 VIEW  Result Date: 07/06/2023 CLINICAL DATA:  Chest pain EXAM: PORTABLE CHEST - 1 VIEW COMPARISON:  05/28/2022 FINDINGS: Linear left perihilar scarring or atelectasis. Right lung clear. No pneumothorax. Heart size and mediastinal contours are within normal limits. CABG markers, and surgical clips over the left hilum. No effusion. Sternotomy wires. IMPRESSION: Left perihilar scarring or atelectasis. Electronically Signed   By: Corlis Leak M.D.   On: 07/06/2023 09:55   Medications:  sodium chloride 150 mL/hr at 07/06/23 0607   sodium chloride     dextrose 5 % and 0.45 % NaCl     dialysis solution 1.5%  low-MG/low-CA      acetaminophen  1,000 mg Oral Q6H   amLODipine  5 mg Oral Daily   aspirin EC  81 mg Oral Daily   bacitracin  1 Application Topical TID   brimonidine  1 drop Both Eyes BID   doxazosin  2 mg Oral Daily   gentamicin cream  1 Application Topical Daily   hydrALAZINE  75 mg Oral TID   ketorolac  1 drop Both Eyes TID   levothyroxine  50 mcg Oral Q0600   sacubitril-valsartan  1 tablet Oral BID   spironolactone  12.5 mg Oral Daily   torsemide  20 mg Oral Daily    Outpatient Dialysis Orders: CCPD patient at [facility]  5 exchanges , 2200 cc dwells, no daybag, 1.5h dwell   Dry wt 66kg    Assessment/Plan: 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 Had PD during the day on 10/5 because he got back from surgery late. Will resume regular PD  tonight.  HTN/ BP - stable BP. 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   Rogers Blocker, PA-C 07/07/2023, 9:53 AM  [facility] Pager: [phone]

## 2023-07-07 NOTE — Progress Notes (Addendum)
Cardiologist:  Dominga Ferry  Subjective:  Denies SSCP, palpitations or Dyspnea Extensive surgical wounds look good JP drain still in   Objective:  Vitals:   07/06/23 1815 07/06/23 2040 07/07/23 0600 07/07/23 0753  BP: (!) 125/55 134/61 (!) 148/74 (!) 143/80  Pulse: 78 72 95 82  Resp: 20 18 20 18   Temp: 98.9 F (37.2 C) 97.8 F (36.6 C) 97.9 F (36.6 C) 98 F (36.7 C)  TempSrc: Axillary Oral Oral Oral  SpO2: 96% 92% 93%   Weight: 61.5 kg     Height:        Intake/Output from previous day:  Intake/Output Summary (Last 24 hours) at 07/07/2023 0952 Last data filed at 07/07/2023 0601 Gross per 24 hour  Intake -16 ml  Output 540 ml  Net -556 ml    Physical Exam:  Comfortable Lungs clear Extensive right parotid surgery with neck dissection  No murmur  Abdomen peritoneal dialysis catheter No edema  Lab Results: Basic Metabolic Panel: Recent Labs    07/05/23 2100 07/06/23 0951  NA 139 138  K 4.6 4.8  CL 108 107  CO2 15* 18*  GLUCOSE 206* 193*  BUN 55* 58*  CREATININE 4.10* 4.44*  CALCIUM 8.6* 9.2   Liver Function Tests: Recent Labs    07/06/23 0951  AST 22  ALT 10  ALKPHOS 62  BILITOT 1.2  PROT 6.1*  ALBUMIN 3.4*   No results for input(s): "LIPASE", "AMYLASE" in the last 72 hours. CBC: Recent Labs    07/05/23 1900 07/05/23 2100  WBC  --  10.5  HGB 9.5* 9.4*  HCT 28.0* 28.2*  MCV  --  101.1*  PLT  --  166     Imaging: DG CHEST PORT 1 VIEW  Result Date: 07/06/2023 CLINICAL DATA:  Chest pain EXAM: PORTABLE CHEST - 1 VIEW COMPARISON:  05/28/2022 FINDINGS: Linear left perihilar scarring or atelectasis. Right lung clear. No pneumothorax. Heart size and mediastinal contours are within normal limits. CABG markers, and surgical clips over the left hilum. No effusion. Sternotomy wires. IMPRESSION: Left perihilar scarring or atelectasis. Electronically Signed   By: Corlis Leak M.D.   On: 07/06/2023 09:55    Cardiac Studies:  ECG: SR ICLBBB old  IMI   Telemetry: NSR   Echo: pending   Medications:    acetaminophen  1,000 mg Oral Q6H   amLODipine  5 mg Oral Daily   aspirin EC  81 mg Oral Daily   bacitracin  1 Application Topical TID   brimonidine  1 drop Both Eyes BID   doxazosin  2 mg Oral Daily   gentamicin cream  1 Application Topical Daily   hydrALAZINE  75 mg Oral TID   ketorolac  1 drop Both Eyes TID   levothyroxine  50 mcg Oral Q0600   sacubitril-valsartan  1 tablet Oral BID   spironolactone  12.5 mg Oral Daily   torsemide  20 mg Oral Daily      sodium chloride 150 mL/hr at 07/06/23 4098   sodium chloride     dextrose 5 % and 0.45 % NaCl     dialysis solution 1.5% low-MG/low-CA      Assessment/Plan:   SEMI:  peak troponin 477-> 445 Pain free  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 Since he is stable , chest pain free and don't want to start heparin or give large doses of  blood thinner so soon after extensive neck dissection plan for elective outpatient cath in 1-2 weeks. D/c norvasc change hydralazine to bidil to add nitrates start low dose coreg.  Sent chat message to primary cardiologist, rounding C doctor and PA.  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

## 2023-07-07 NOTE — Progress Notes (Signed)
ENT PROGRESS NOTE   Subjective: Patient seen and examined at bedside. No issues overnight. Patient reports pain is controlled. Family at bedside. Has not been OOB, does not feel ready to go home today.   Objective: Vital signs in last 24 hours: Temp:  [97.8 F (36.6 C)-98.9 F (37.2 C)] 98 F (36.7 C) (10/06 0753) Pulse Rate:  [72-95] 82 (10/06 0753) Resp:  [18-20] 18 (10/06 0753) BP: (125-148)/(55-80) 143/80 (10/06 0753) SpO2:  [92 %-96 %] 93 % (10/06 0600) Weight:  [61.5 kg] 61.5 kg (10/05 1815)  Drain output 24 hours:  Lateral Neck: 35cc Posterior Neck: 55 cc  CONSTITUTIONAL: well developed, nourished, no distress and alert and oriented x 3 PULMONARY/CHEST WALL: effort normal and no stridor, no stertor, no dysphonia HENT: Head : normocephalic and atraumatic Nose: nose normal and no purulence Mouth/Throat:  Mouth: uvula midline and no oral lesions Throat: oropharynx clear and moist Mucous membranes: normal EYES: conjunctiva normal, EOM normal  NECK: Right neck incision C/D/I. Drains holding charge with sanguinous/serosanguinous drainage. No evidence of seroma or hematoma. NEURO: CN II-XII intact   Recent Labs    07/05/23 2100 07/06/23 0951  NA 139 138  K 4.6 4.8  CL 108 107  CO2 15* 18*  GLUCOSE 206* 193*  BUN 55* 58*  CREATININE 4.10* 4.44*  CALCIUM 8.6* 9.2    Medications: I have reviewed the patient's current medications.  New Imaging:   EXAM: PORTABLE CHEST - 1 VIEW   COMPARISON:  05/28/2022   FINDINGS: Linear left perihilar scarring or atelectasis. Right lung clear. No pneumothorax.   Heart size and mediastinal contours are within normal limits. CABG markers, and surgical clips over the left hilum.   No effusion.   Sternotomy wires.   IMPRESSION: Left perihilar scarring or atelectasis.  Pathology: Pending  Assessment/Plan: Bradley Black is a 77 y/o M POD #2 s/p right radical parotidectomy with excision of facial skin and right modified  radical neck dissection and cervicofacial rotation advancement flap -Cardiology following, appreciate recs. Patient on ASA with plans for outpatient cath in 1-2 weeks. -Continue PD as per nephrology -Continue remaining JP drain to bulb suction, lower output drain pulled today -Advance diet as tolerated -Ambulate with assist. PT consulted.  -Anticipate discharge tomorrow   LOS: 2 days    Laren Boom, DO [facility] 07/07/2023, 9:24 AM

## 2023-07-07 NOTE — Progress Notes (Signed)
Dr. Eden Emms recommends this patient have outpatient f/u 1 week after discharge. DC date pending.  I have sent a message to our office's [place] scheduling teams requesting a follow-up appointment, and our office will call the patient with this information. Added FYI to AVS that office will call.

## 2023-07-07 NOTE — Progress Notes (Signed)
  Echocardiogram 2D Echocardiogram has been performed.  Delcie Roch 07/07/2023, 11:43 AM

## 2023-07-07 NOTE — Evaluation (Signed)
Physical Therapy Evaluation Patient Details Name: Bradley Black MRN: [id number] DOB: 12/29/1945 Today's Date: 07/07/2023  History of Present Illness  77 y.o. 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 PMH includes BPH, CAD, chronic back pain, CKD III, diabetic retinopathy, DM, HTN, GERD, ischemic cardiomyopathy, CABG.  Clinical Impression  Pt presents to PT with deficits in endurance, strength, power, gait. Pt is able to ambulate for household distances but reports LE weakness when doing so. PT encourages frequent mobilization in an effort to gradually improve strength and activity tolerance s/p surgery, as this was the pt's first time ambulating this admission. PT recommends discharge home when medically appropriate.        If plan is discharge home, recommend the following: Help with stairs or ramp for entrance;Assist for transportation   Can travel by private vehicle        Equipment Recommendations None recommended by PT  Recommendations for Other Services       Functional Status Assessment Patient has had a recent decline in their functional status and demonstrates the ability to make significant improvements in function in a reasonable and predictable amount of time.     Precautions / Restrictions Precautions Precautions: Other (comment) Precaution Comments: JP drain R neck Restrictions Weight Bearing Restrictions: No      Mobility  Bed Mobility                    Transfers Overall transfer level: Needs assistance Equipment used: None Transfers: Sit to/from Stand Sit to Stand: Supervision                Ambulation/Gait Ambulation/Gait assistance: Supervision Gait Distance (Feet): 250 Feet Assistive device: None Gait Pattern/deviations: Step-through pattern Gait velocity: functional Gait velocity interpretation: 1.31 - 2.62 ft/sec, indicative of limited community  ambulator   General Gait Details: slowed step-through gait  Stairs            Wheelchair Mobility     Tilt Bed    Modified Rankin (Stroke Patients Only)       Balance Overall balance assessment: Needs assistance Sitting-balance support: No upper extremity supported, Feet supported Sitting balance-Leahy Scale: Good     Standing balance support: No upper extremity supported, During functional activity Standing balance-Leahy Scale: Good                               Pertinent Vitals/Pain Pain Assessment Pain Assessment: Faces Faces Pain Scale: Hurts little more Pain Location: neck Pain Descriptors / Indicators: Sore Pain Intervention(s): Monitored during session    Home Living Family/patient expects to be discharged to:: Private residence Living Arrangements: Spouse/significant other Available Help at Discharge: Family Type of Home: House Home Access: Ramped entrance       Home Layout: One level Home Equipment: Agricultural consultant (2 wheels);BSC/3in1      Prior Function Prior Level of Function : Independent/Modified Independent;Driving             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       Extremity/Trunk Assessment   Upper Extremity Assessment Upper Extremity Assessment: Overall WFL for tasks assessed    Lower Extremity Assessment Lower Extremity Assessment: Generalized weakness    Cervical / Trunk Assessment Cervical / Trunk Assessment: Normal  Communication   Communication Communication: Hearing impairment Cueing Techniques: Verbal cues  Cognition Arousal:  Alert Behavior During Therapy: Flat affect Overall Cognitive Status: Within Functional Limits for tasks assessed                                          General Comments General comments (skin integrity, edema, etc.): VSS on RA    Exercises     Assessment/Plan    PT Assessment Patient needs continued PT services  PT Problem List  Decreased strength;Decreased activity tolerance;Decreased balance;Decreased mobility       PT Treatment Interventions DME instruction;Gait training;Stair training;Functional mobility training;Balance training;Patient/family education    PT Goals (Current goals can be found in the Care Plan section)  Acute Rehab PT Goals Patient Stated Goal: to return home PT Goal Formulation: With patient Time For Goal Achievement: 07/21/23 Potential to Achieve Goals: Good Additional Goals Additional Goal #1: Pt will score >19/24 on the DGI to indicate a reduced risk for falls Additional Goal #2: Pt will score >45/56 on the BERG to indicate a reduced risk for falls    Frequency Min 1X/week     Co-evaluation               AM-PAC PT "6 Clicks" Mobility  Outcome Measure Help needed turning from your back to your side while in a flat bed without using bedrails?: None Help needed moving from lying on your back to sitting on the side of a flat bed without using bedrails?: A Little Help needed moving to and from a bed to a chair (including a wheelchair)?: A Little Help needed standing up from a chair using your arms (e.g., wheelchair or bedside chair)?: A Little Help needed to walk in hospital room?: A Little Help needed climbing 3-5 steps with a railing? : A Little 6 Click Score: 19    End of Session   Activity Tolerance: Patient tolerated treatment well Patient left: in chair;with call bell/phone within reach;with chair alarm set Nurse Communication: Mobility status PT Visit Diagnosis: Other abnormalities of gait and mobility (R26.89);Muscle weakness (generalized) (M62.81)    Time: 1610-9604 PT Time Calculation (min) (ACUTE ONLY): 13 min   Charges:   PT Evaluation $PT Eval Low Complexity: 1 Low   PT General Charges $$ ACUTE PT VISIT: 1 Visit         Arlyss Gandy, PT, DPT Acute Rehabilitation Office [phone]   Arlyss Gandy 07/07/2023, 2:25 PM

## 2023-07-08 ENCOUNTER — Other Ambulatory Visit (HOSPITAL_COMMUNITY): Payer: Self-pay

## 2023-07-08 DIAGNOSIS — C07 Malignant neoplasm of parotid gland: Secondary | ICD-10-CM | POA: Diagnosis not present

## 2023-07-08 DIAGNOSIS — I10 Essential (primary) hypertension: Secondary | ICD-10-CM | POA: Diagnosis not present

## 2023-07-08 DIAGNOSIS — I255 Ischemic cardiomyopathy: Secondary | ICD-10-CM | POA: Diagnosis not present

## 2023-07-08 DIAGNOSIS — N186 End stage renal disease: Secondary | ICD-10-CM | POA: Diagnosis not present

## 2023-07-08 DIAGNOSIS — R7989 Other specified abnormal findings of blood chemistry: Secondary | ICD-10-CM | POA: Diagnosis not present

## 2023-07-08 DIAGNOSIS — Z23 Encounter for immunization: Secondary | ICD-10-CM | POA: Diagnosis not present

## 2023-07-08 DIAGNOSIS — Z992 Dependence on renal dialysis: Secondary | ICD-10-CM | POA: Diagnosis not present

## 2023-07-08 MED ORDER — ISOSORBIDE MONONITRATE ER 30 MG PO TB24: 30.0000 mg | ORAL_TABLET | Freq: Every day | ORAL | 1 refills | Status: DC

## 2023-07-08 MED ORDER — ISOSORBIDE MONONITRATE ER 30 MG PO TB24: 30.0000 mg | ORAL_TABLET | Freq: Every day | ORAL | Status: DC

## 2023-07-08 NOTE — Progress Notes (Signed)
Heart Failure Navigator Progress Note  Assessed for Heart & Vascular TOC clinic readiness.  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   Navigator will sign off at this time.   Rhae Hammock, BSN, Scientist, clinical (histocompatibility and immunogenetics) Only

## 2023-07-08 NOTE — TOC Benefit Eligibility Note (Signed)
Patient Product/process development scientist completed.    The patient is insured through [facility]. Patient has Medicare and is not eligible for a copay card, but may be able to apply for patient assistance, if available.    Ran test claim for isosorbide-hydralazine (Bidil) 20-37.5 mg and the current 30 day co-pay is $47.00.   This test claim was processed through [facility]- copay amounts may vary at other pharmacies due to pharmacy/plan contracts, or as the patient moves through the different stages of their insurance plan.     Roland Earl, CPHT Pharmacy Technician III Certified Patient Advocate [facility] Patient Advocate Team Direct Number: [phone]  Fax: [phone]

## 2023-07-08 NOTE — Discharge Summary (Signed)
by mouth daily as needed for erectile dysfunction.   spironolactone 25 MG tablet Commonly known as: ALDACTONE Take 0.5 tablets (12.5 mg total) by mouth daily.   torsemide 20 MG tablet Commonly known as: DEMADEX Take 20 mg by mouth daily.   Vitamin D3 25 MCG (1000 UT) Caps Take 1 capsule (1,000 Units total) by mouth daily at 6 (six) AM.        Diagnostic Studies: ECHOCARDIOGRAM COMPLETE  Result Date: 07/07/2023    ECHOCARDIOGRAM REPORT   Patient Name:   Bradley Black Date of Exam: 07/07/2023 Medical Rec #:  [id number]       Height:       68.0 in Accession #:    [id number]      Weight:       135.6 lb Date of Birth:  09-02-1946       BSA:          1.733 m Patient Age:    77 years        BP:           143/80 mmHg Patient Gender: M               HR:           78 bpm. Exam Location:  Inpatient Procedure: 2D Echo, Color Doppler and Cardiac Doppler Indications:    chest pain  History:        Patient has prior history of Echocardiogram examinations, most                 recent 11/17/2020. Cardiomyopathy, Prior CABG, end stage renal                  disease; Risk Factors:Diabetes and Hypertension.  Sonographer:    Delcie Roch RDCS Referring Phys: [id number] PETER C NISHAN IMPRESSIONS  1. Septal , apical , inferior wall hypokinesis . Left ventricular ejection fraction, by estimation, is 40 to 45%. The left ventricle has mildly decreased function. The left ventricle has no regional wall motion abnormalities. The left ventricular internal cavity size was mildly dilated. There is mild left ventricular hypertrophy. Left ventricular diastolic parameters were normal.  2. Right ventricular systolic function is normal. The right ventricular size is normal. There is mildly elevated pulmonary artery systolic pressure.  3. Left atrial size was severely dilated.  4. Right atrial size was mildly dilated.  5. The mitral valve is abnormal. Mild to moderate mitral valve regurgitation. No evidence of mitral stenosis.  6. Tricuspid valve regurgitation is mild to moderate.  7. The aortic valve is tricuspid. Aortic valve regurgitation is not visualized. No aortic stenosis is present.  8. The inferior vena cava is normal in size with greater than 50% respiratory variability, suggesting right atrial pressure of 3 mmHg. FINDINGS  Left Ventricle: Septal , apical , inferior wall hypokinesis. Left ventricular ejection fraction, by estimation, is 40 to 45%. The left ventricle has mildly decreased function. The left ventricle has no regional wall motion abnormalities. The left ventricular internal cavity size was mildly dilated. There is mild left ventricular hypertrophy. Left ventricular diastolic parameters were normal. Right Ventricle: The right ventricular size is normal. No increase in right ventricular wall thickness. Right ventricular systolic function is normal. There is mildly elevated pulmonary artery systolic pressure. The tricuspid regurgitant velocity is 3.22  m/s, and with an assumed right atrial pressure of 3 mmHg, the estimated right ventricular systolic pressure is  44.5 mmHg. Left Atrium: Left atrial size was severely dilated.  by mouth daily as needed for erectile dysfunction.   spironolactone 25 MG tablet Commonly known as: ALDACTONE Take 0.5 tablets (12.5 mg total) by mouth daily.   torsemide 20 MG tablet Commonly known as: DEMADEX Take 20 mg by mouth daily.   Vitamin D3 25 MCG (1000 UT) Caps Take 1 capsule (1,000 Units total) by mouth daily at 6 (six) AM.        Diagnostic Studies: ECHOCARDIOGRAM COMPLETE  Result Date: 07/07/2023    ECHOCARDIOGRAM REPORT   Patient Name:   Bradley Black Date of Exam: 07/07/2023 Medical Rec #:  [id number]       Height:       68.0 in Accession #:    [id number]      Weight:       135.6 lb Date of Birth:  09-02-1946       BSA:          1.733 m Patient Age:    77 years        BP:           143/80 mmHg Patient Gender: M               HR:           78 bpm. Exam Location:  Inpatient Procedure: 2D Echo, Color Doppler and Cardiac Doppler Indications:    chest pain  History:        Patient has prior history of Echocardiogram examinations, most                 recent 11/17/2020. Cardiomyopathy, Prior CABG, end stage renal                  disease; Risk Factors:Diabetes and Hypertension.  Sonographer:    Delcie Roch RDCS Referring Phys: [id number] PETER C NISHAN IMPRESSIONS  1. Septal , apical , inferior wall hypokinesis . Left ventricular ejection fraction, by estimation, is 40 to 45%. The left ventricle has mildly decreased function. The left ventricle has no regional wall motion abnormalities. The left ventricular internal cavity size was mildly dilated. There is mild left ventricular hypertrophy. Left ventricular diastolic parameters were normal.  2. Right ventricular systolic function is normal. The right ventricular size is normal. There is mildly elevated pulmonary artery systolic pressure.  3. Left atrial size was severely dilated.  4. Right atrial size was mildly dilated.  5. The mitral valve is abnormal. Mild to moderate mitral valve regurgitation. No evidence of mitral stenosis.  6. Tricuspid valve regurgitation is mild to moderate.  7. The aortic valve is tricuspid. Aortic valve regurgitation is not visualized. No aortic stenosis is present.  8. The inferior vena cava is normal in size with greater than 50% respiratory variability, suggesting right atrial pressure of 3 mmHg. FINDINGS  Left Ventricle: Septal , apical , inferior wall hypokinesis. Left ventricular ejection fraction, by estimation, is 40 to 45%. The left ventricle has mildly decreased function. The left ventricle has no regional wall motion abnormalities. The left ventricular internal cavity size was mildly dilated. There is mild left ventricular hypertrophy. Left ventricular diastolic parameters were normal. Right Ventricle: The right ventricular size is normal. No increase in right ventricular wall thickness. Right ventricular systolic function is normal. There is mildly elevated pulmonary artery systolic pressure. The tricuspid regurgitant velocity is 3.22  m/s, and with an assumed right atrial pressure of 3 mmHg, the estimated right ventricular systolic pressure is  44.5 mmHg. Left Atrium: Left atrial size was severely dilated.  by mouth daily as needed for erectile dysfunction.   spironolactone 25 MG tablet Commonly known as: ALDACTONE Take 0.5 tablets (12.5 mg total) by mouth daily.   torsemide 20 MG tablet Commonly known as: DEMADEX Take 20 mg by mouth daily.   Vitamin D3 25 MCG (1000 UT) Caps Take 1 capsule (1,000 Units total) by mouth daily at 6 (six) AM.        Diagnostic Studies: ECHOCARDIOGRAM COMPLETE  Result Date: 07/07/2023    ECHOCARDIOGRAM REPORT   Patient Name:   Bradley Black Date of Exam: 07/07/2023 Medical Rec #:  [id number]       Height:       68.0 in Accession #:    [id number]      Weight:       135.6 lb Date of Birth:  09-02-1946       BSA:          1.733 m Patient Age:    77 years        BP:           143/80 mmHg Patient Gender: M               HR:           78 bpm. Exam Location:  Inpatient Procedure: 2D Echo, Color Doppler and Cardiac Doppler Indications:    chest pain  History:        Patient has prior history of Echocardiogram examinations, most                 recent 11/17/2020. Cardiomyopathy, Prior CABG, end stage renal                  disease; Risk Factors:Diabetes and Hypertension.  Sonographer:    Delcie Roch RDCS Referring Phys: [id number] PETER C NISHAN IMPRESSIONS  1. Septal , apical , inferior wall hypokinesis . Left ventricular ejection fraction, by estimation, is 40 to 45%. The left ventricle has mildly decreased function. The left ventricle has no regional wall motion abnormalities. The left ventricular internal cavity size was mildly dilated. There is mild left ventricular hypertrophy. Left ventricular diastolic parameters were normal.  2. Right ventricular systolic function is normal. The right ventricular size is normal. There is mildly elevated pulmonary artery systolic pressure.  3. Left atrial size was severely dilated.  4. Right atrial size was mildly dilated.  5. The mitral valve is abnormal. Mild to moderate mitral valve regurgitation. No evidence of mitral stenosis.  6. Tricuspid valve regurgitation is mild to moderate.  7. The aortic valve is tricuspid. Aortic valve regurgitation is not visualized. No aortic stenosis is present.  8. The inferior vena cava is normal in size with greater than 50% respiratory variability, suggesting right atrial pressure of 3 mmHg. FINDINGS  Left Ventricle: Septal , apical , inferior wall hypokinesis. Left ventricular ejection fraction, by estimation, is 40 to 45%. The left ventricle has mildly decreased function. The left ventricle has no regional wall motion abnormalities. The left ventricular internal cavity size was mildly dilated. There is mild left ventricular hypertrophy. Left ventricular diastolic parameters were normal. Right Ventricle: The right ventricular size is normal. No increase in right ventricular wall thickness. Right ventricular systolic function is normal. There is mildly elevated pulmonary artery systolic pressure. The tricuspid regurgitant velocity is 3.22  m/s, and with an assumed right atrial pressure of 3 mmHg, the estimated right ventricular systolic pressure is  44.5 mmHg. Left Atrium: Left atrial size was severely dilated.  Physician Discharge Summary  Patient ID: Bradley Black MRN: [id number] DOB/AGE: 02/14/1946 77 y.o.  Admit date: 07/05/2023 Discharge date: 07/08/2023  Admission Diagnoses:  Principal Problem:   Malignant neoplasm of parotid gland [facility])   Discharge Diagnoses:  Same  Surgeries: Procedure(s): 1. Right radical parotidectomy with wide local excision facial skin 3x3cm, dissection and preservation of facial nerve (CPT 42415, 11644) 2. Right modified radical neck dissection 1-3 (CPT 8047686196)  3. Cervicofacial rotation advancement flap 10x10cm for closure of 3x3cm defect (CPT 14040) on 07/05/2023   Consultants: Cardiology, Nephrology   Discharged Condition: Improved  Hospital Course: Bradley Black is an 77 y.o. male who was admitted 07/05/2023 with a chief complaint of No chief complaint on file. , and found to have a diagnosis of Malignant neoplasm of parotid gland (HCC).  They were brought to the operating room on 07/05/2023 and underwent the above named procedures.  Post-operatively the patient developed chest pain and had elevated troponins. Cardiology consulted who recommended adjusting some of his BP medications and proceeding with outpatient cardiac cath in the next 1-2 weeks. There were no further symptoms of an acute coronary syndrome. Nephrology was consulted for nightly peritoneal dialysis. He did not receive dialysis on evening of 07/07/23 and will resume home dialysis this evening.   Wounds intact upon discharge without hematoma or seroma.   Physical Exam:  General: Awake and alert, no acute distress Neck: Flaps down, incision c/d/i Right facial nerve upper division (Frontal/zygomatic) with weakness of forehead movement and eye closure with 1mm lagophthalmos. Rest of cranial nerves intact.   Recent vital signs:  Vitals:   07/08/23 0515 07/08/23 0742  BP:  (!) 144/68  Pulse: 92   Resp:  17  Temp:  98.7 F (37.1 C)  SpO2: 95% 96%    Recent laboratory studies:  Results  for orders placed or performed during the hospital encounter of 07/05/23  Glucose, capillary  Result Value Ref Range   Glucose-Capillary 128 (H) 70 - 99 mg/dL  Glucose, capillary  Result Value Ref Range   Glucose-Capillary 107 (H) 70 - 99 mg/dL  Glucose, capillary  Result Value Ref Range   Glucose-Capillary 192 (H) 70 - 99 mg/dL  Basic metabolic panel  Result Value Ref Range   Sodium 139 135 - 145 mmol/L   Potassium 4.6 3.5 - 5.1 mmol/L   Chloride 108 98 - 111 mmol/L   CO2 15 (L) 22 - 32 mmol/L   Glucose, Bld 206 (H) 70 - 99 mg/dL   BUN 55 (H) 8 - 23 mg/dL   Creatinine, Ser 1.91 (H) 0.61 - 1.24 mg/dL   Calcium 8.6 (L) 8.9 - 10.3 mg/dL   GFR, Estimated 14 (L) >60 mL/min   Anion gap 16 (H) 5 - 15  CBC  Result Value Ref Range   WBC 10.5 4.0 - 10.5 K/uL   RBC 2.79 (L) 4.22 - 5.81 MIL/uL   Hemoglobin 9.4 (L) 13.0 - 17.0 g/dL   HCT 47.8 (L) 29.5 - 62.1 %   MCV 101.1 (H) 80.0 - 100.0 fL   MCH 33.7 26.0 - 34.0 pg   MCHC 33.3 30.0 - 36.0 g/dL   RDW 30.8 65.7 - 84.6 %   Platelets 166 150 - 400 K/uL   nRBC 0.0 0.0 - 0.2 %  Comprehensive metabolic panel  Result Value Ref Range   Sodium 138 135 - 145 mmol/L   Potassium 4.8 3.5 - 5.1 mmol/L   Chloride 107 98 - 111 mmol/L  by mouth daily as needed for erectile dysfunction.   spironolactone 25 MG tablet Commonly known as: ALDACTONE Take 0.5 tablets (12.5 mg total) by mouth daily.   torsemide 20 MG tablet Commonly known as: DEMADEX Take 20 mg by mouth daily.   Vitamin D3 25 MCG (1000 UT) Caps Take 1 capsule (1,000 Units total) by mouth daily at 6 (six) AM.        Diagnostic Studies: ECHOCARDIOGRAM COMPLETE  Result Date: 07/07/2023    ECHOCARDIOGRAM REPORT   Patient Name:   Bradley Black Date of Exam: 07/07/2023 Medical Rec #:  [id number]       Height:       68.0 in Accession #:    [id number]      Weight:       135.6 lb Date of Birth:  09-02-1946       BSA:          1.733 m Patient Age:    77 years        BP:           143/80 mmHg Patient Gender: M               HR:           78 bpm. Exam Location:  Inpatient Procedure: 2D Echo, Color Doppler and Cardiac Doppler Indications:    chest pain  History:        Patient has prior history of Echocardiogram examinations, most                 recent 11/17/2020. Cardiomyopathy, Prior CABG, end stage renal                  disease; Risk Factors:Diabetes and Hypertension.  Sonographer:    Delcie Roch RDCS Referring Phys: [id number] PETER C NISHAN IMPRESSIONS  1. Septal , apical , inferior wall hypokinesis . Left ventricular ejection fraction, by estimation, is 40 to 45%. The left ventricle has mildly decreased function. The left ventricle has no regional wall motion abnormalities. The left ventricular internal cavity size was mildly dilated. There is mild left ventricular hypertrophy. Left ventricular diastolic parameters were normal.  2. Right ventricular systolic function is normal. The right ventricular size is normal. There is mildly elevated pulmonary artery systolic pressure.  3. Left atrial size was severely dilated.  4. Right atrial size was mildly dilated.  5. The mitral valve is abnormal. Mild to moderate mitral valve regurgitation. No evidence of mitral stenosis.  6. Tricuspid valve regurgitation is mild to moderate.  7. The aortic valve is tricuspid. Aortic valve regurgitation is not visualized. No aortic stenosis is present.  8. The inferior vena cava is normal in size with greater than 50% respiratory variability, suggesting right atrial pressure of 3 mmHg. FINDINGS  Left Ventricle: Septal , apical , inferior wall hypokinesis. Left ventricular ejection fraction, by estimation, is 40 to 45%. The left ventricle has mildly decreased function. The left ventricle has no regional wall motion abnormalities. The left ventricular internal cavity size was mildly dilated. There is mild left ventricular hypertrophy. Left ventricular diastolic parameters were normal. Right Ventricle: The right ventricular size is normal. No increase in right ventricular wall thickness. Right ventricular systolic function is normal. There is mildly elevated pulmonary artery systolic pressure. The tricuspid regurgitant velocity is 3.22  m/s, and with an assumed right atrial pressure of 3 mmHg, the estimated right ventricular systolic pressure is  44.5 mmHg. Left Atrium: Left atrial size was severely dilated.  Physician Discharge Summary  Patient ID: Bradley Black MRN: [id number] DOB/AGE: 02/14/1946 77 y.o.  Admit date: 07/05/2023 Discharge date: 07/08/2023  Admission Diagnoses:  Principal Problem:   Malignant neoplasm of parotid gland [facility])   Discharge Diagnoses:  Same  Surgeries: Procedure(s): 1. Right radical parotidectomy with wide local excision facial skin 3x3cm, dissection and preservation of facial nerve (CPT 42415, 11644) 2. Right modified radical neck dissection 1-3 (CPT 8047686196)  3. Cervicofacial rotation advancement flap 10x10cm for closure of 3x3cm defect (CPT 14040) on 07/05/2023   Consultants: Cardiology, Nephrology   Discharged Condition: Improved  Hospital Course: Bradley Black is an 77 y.o. male who was admitted 07/05/2023 with a chief complaint of No chief complaint on file. , and found to have a diagnosis of Malignant neoplasm of parotid gland (HCC).  They were brought to the operating room on 07/05/2023 and underwent the above named procedures.  Post-operatively the patient developed chest pain and had elevated troponins. Cardiology consulted who recommended adjusting some of his BP medications and proceeding with outpatient cardiac cath in the next 1-2 weeks. There were no further symptoms of an acute coronary syndrome. Nephrology was consulted for nightly peritoneal dialysis. He did not receive dialysis on evening of 07/07/23 and will resume home dialysis this evening.   Wounds intact upon discharge without hematoma or seroma.   Physical Exam:  General: Awake and alert, no acute distress Neck: Flaps down, incision c/d/i Right facial nerve upper division (Frontal/zygomatic) with weakness of forehead movement and eye closure with 1mm lagophthalmos. Rest of cranial nerves intact.   Recent vital signs:  Vitals:   07/08/23 0515 07/08/23 0742  BP:  (!) 144/68  Pulse: 92   Resp:  17  Temp:  98.7 F (37.1 C)  SpO2: 95% 96%    Recent laboratory studies:  Results  for orders placed or performed during the hospital encounter of 07/05/23  Glucose, capillary  Result Value Ref Range   Glucose-Capillary 128 (H) 70 - 99 mg/dL  Glucose, capillary  Result Value Ref Range   Glucose-Capillary 107 (H) 70 - 99 mg/dL  Glucose, capillary  Result Value Ref Range   Glucose-Capillary 192 (H) 70 - 99 mg/dL  Basic metabolic panel  Result Value Ref Range   Sodium 139 135 - 145 mmol/L   Potassium 4.6 3.5 - 5.1 mmol/L   Chloride 108 98 - 111 mmol/L   CO2 15 (L) 22 - 32 mmol/L   Glucose, Bld 206 (H) 70 - 99 mg/dL   BUN 55 (H) 8 - 23 mg/dL   Creatinine, Ser 1.91 (H) 0.61 - 1.24 mg/dL   Calcium 8.6 (L) 8.9 - 10.3 mg/dL   GFR, Estimated 14 (L) >60 mL/min   Anion gap 16 (H) 5 - 15  CBC  Result Value Ref Range   WBC 10.5 4.0 - 10.5 K/uL   RBC 2.79 (L) 4.22 - 5.81 MIL/uL   Hemoglobin 9.4 (L) 13.0 - 17.0 g/dL   HCT 47.8 (L) 29.5 - 62.1 %   MCV 101.1 (H) 80.0 - 100.0 fL   MCH 33.7 26.0 - 34.0 pg   MCHC 33.3 30.0 - 36.0 g/dL   RDW 30.8 65.7 - 84.6 %   Platelets 166 150 - 400 K/uL   nRBC 0.0 0.0 - 0.2 %  Comprehensive metabolic panel  Result Value Ref Range   Sodium 138 135 - 145 mmol/L   Potassium 4.8 3.5 - 5.1 mmol/L   Chloride 107 98 - 111 mmol/L

## 2023-07-08 NOTE — Progress Notes (Addendum)
Rounding Note    Patient Name: Bradley Black Date of Encounter: 07/08/2023  [facility] Cardiologist: Dina Rich, MD   Subjective   Upon entering there room, pt sitting up in bed but appears tired. Wife at bedside very upset that he was not able to complete PD last night.   Inpatient Medications    Scheduled Meds:  acetaminophen  1,000 mg Oral Q6H   amLODipine  5 mg Oral Daily   aspirin EC  81 mg Oral Daily   bacitracin  1 Application Topical TID   brimonidine  1 drop Both Eyes BID   Chlorhexidine Gluconate Cloth  6 each Topical Daily   doxazosin  2 mg Oral Daily   gentamicin cream  1 Application Topical Daily   hydrALAZINE  75 mg Oral TID   ketorolac  1 drop Both Eyes TID   levothyroxine  50 mcg Oral Q0600   sacubitril-valsartan  1 tablet Oral BID   spironolactone  12.5 mg Oral Daily   torsemide  20 mg Oral Daily   Continuous Infusions:  sodium chloride 150 mL/hr at 07/06/23 0607   sodium chloride     dextrose 5 % and 0.45 % NaCl     dialysis solution 1.5% low-MG/low-CA     PRN Meds: HYDROcodone-acetaminophen   Vital Signs    Vitals:   07/08/23 0513 07/08/23 0514 07/08/23 0515 07/08/23 0742  BP:  (!) 151/71  (!) 144/68  Pulse: 98 92 92   Resp:  18  17  Temp:  98.5 F (36.9 C)  98.7 F (37.1 C)  TempSrc:  Oral  Oral  SpO2: 96% 96% 95% 96%  Weight:      Height:        Intake/Output Summary (Last 24 hours) at 07/08/2023 0922 Last data filed at 07/08/2023 0500 Gross per 24 hour  Intake 420 ml  Output 1120 ml  Net -700 ml      07/06/2023    6:15 PM 07/05/2023    9:52 AM 07/01/2023   10:32 AM  Last 3 Weights  Weight (lbs) 135 lb 9.3 oz 142 lb 146 lb 12.8 oz  Weight (kg) 61.5 kg 64.411 kg 66.588 kg      Telemetry    Sinus rhythm with HR 90s - Personally Reviewed  ECG    No new tracings - Personally Reviewed  Physical Exam   GEN: No acute distress.   Neck: + JVD Cardiac: RRR, no murmurs, rubs, or gallops.  Respiratory:  significant crackles in bases. GI: Soft, nontender, non-distended  MS: No edema; No deformity. Neuro:  Nonfocal  Psych: Normal affect   Labs    High Sensitivity Troponin:   Recent Labs  Lab 07/06/23 0951 07/06/23 1120  TROPONINIHS 477* 445*     Chemistry Recent Labs  Lab 07/05/23 1805 07/05/23 1900 07/05/23 2100 07/06/23 0951  NA 140 141 139 138  K 4.5 4.5 4.6 4.8  CL 111  --  108 107  CO2  --   --  15* 18*  GLUCOSE 187*  --  206* 193*  BUN 49*  --  55* 58*  CREATININE 4.50*  --  4.10* 4.44*  CALCIUM  --   --  8.6* 9.2  PROT  --   --   --  6.1*  ALBUMIN  --   --   --  3.4*  AST  --   --   --  22  ALT  --   --   --  10  ALKPHOS  --   --   --  62  BILITOT  --   --   --  1.2  GFRNONAA  --   --  14* 13*  ANIONGAP  --   --  16* 13    Lipids No results for input(s): "CHOL", "TRIG", "HDL", "LABVLDL", "LDLCALC", "CHOLHDL" in the last 168 hours.  Hematology Recent Labs  Lab 07/01/23 1127 07/05/23 1035 07/05/23 1805 07/05/23 1900 07/05/23 2100  WBC 8.4  --   --   --  10.5  RBC 3.02*  --   --   --  2.79*  HGB 9.8*   < > 9.9* 9.5* 9.4*  HCT 30.4*   < > 29.0* 28.0* 28.2*  MCV 100.7*  --   --   --  101.1*  MCH 32.5  --   --   --  33.7  MCHC 32.2  --   --   --  33.3  RDW 12.8  --   --   --  14.2  PLT 203  --   --   --  166   < > = values in this interval not displayed.   Thyroid No results for input(s): "TSH", "FREET4" in the last 168 hours.  BNP Recent Labs  Lab 07/06/23 0951  BNP 3,042.8*    DDimer No results for input(s): "DDIMER" in the last 168 hours.   Radiology    ECHOCARDIOGRAM COMPLETE  Result Date: 07/07/2023    ECHOCARDIOGRAM REPORT   Patient Name:   Bradley Black Date of Exam: 07/07/2023 Medical Rec #:  [id number]       Height:       68.0 in Accession #:    [id number]      Weight:       135.6 lb Date of Birth:  Mar 07, 1946       BSA:          1.733 m Patient Age:    77 years        BP:           143/80 mmHg Patient Gender: M               HR:            78 bpm. Exam Location:  Inpatient Procedure: 2D Echo, Color Doppler and Cardiac Doppler Indications:    chest pain  History:        Patient has prior history of Echocardiogram examinations, most                 recent 11/17/2020. Cardiomyopathy, Prior CABG, end stage renal                 disease; Risk Factors:Diabetes and Hypertension.  Sonographer:    Delcie Roch RDCS Referring Phys: [id number] PETER C NISHAN IMPRESSIONS  1. Septal , apical , inferior wall hypokinesis . Left ventricular ejection fraction, by estimation, is 40 to 45%. The left ventricle has mildly decreased function. The left ventricle has no regional wall motion abnormalities. The left ventricular internal cavity size was mildly dilated. There is mild left ventricular hypertrophy. Left ventricular diastolic parameters were normal.  2. Right ventricular systolic function is normal. The right ventricular size is normal. There is mildly elevated pulmonary artery systolic pressure.  3. Left atrial size was severely dilated.  4. Right atrial size was mildly dilated.  5. The mitral valve is abnormal. Mild to moderate mitral valve regurgitation. No evidence of mitral stenosis.  6. Tricuspid valve regurgitation is mild to moderate.  7. The aortic valve is tricuspid. Aortic valve regurgitation is not visualized. No aortic stenosis is present.  8. The inferior vena cava is normal in size with greater than 50% respiratory variability, suggesting right atrial pressure of 3 mmHg. FINDINGS  Left Ventricle: Septal , apical , inferior wall hypokinesis. Left ventricular ejection fraction, by estimation, is 40 to 45%. The left ventricle has mildly decreased function. The left ventricle has no regional wall motion abnormalities. The left ventricular internal cavity size was mildly dilated. There is mild left ventricular hypertrophy. Left ventricular diastolic parameters were normal. Right Ventricle: The right ventricular size is normal. No increase in right  ventricular wall thickness. Right ventricular systolic function is normal. There is mildly elevated pulmonary artery systolic pressure. The tricuspid regurgitant velocity is 3.22  m/s, and with an assumed right atrial pressure of 3 mmHg, the estimated right ventricular systolic pressure is 44.5 mmHg. Left Atrium: Left atrial size was severely dilated. Right Atrium: Right atrial size was mildly dilated. Pericardium: There is no evidence of pericardial effusion. Mitral Valve: The mitral valve is abnormal. There is mild thickening of the mitral valve leaflet(s). Mild to moderate mitral valve regurgitation. No evidence of mitral valve stenosis. Tricuspid Valve: The tricuspid valve is normal in structure. Tricuspid valve regurgitation is mild to moderate. No evidence of tricuspid stenosis. Aortic Valve: The aortic valve is tricuspid. Aortic valve regurgitation is not visualized. No aortic stenosis is present. Pulmonic Valve: The pulmonic valve was normal in structure. Pulmonic valve regurgitation is mild. No evidence of pulmonic stenosis. Aorta: The aortic root is normal in size and structure. Venous: The inferior vena cava is normal in size with greater than 50% respiratory variability, suggesting right atrial pressure of 3 mmHg. IAS/Shunts: No atrial level shunt detected by color flow Doppler.  LEFT VENTRICLE PLAX 2D LVIDd:         5.10 cm      Diastology LVIDs:         4.20 cm      LV e' medial:    6.31 cm/s LV PW:         1.10 cm      LV E/e' medial:  15.3 LV IVS:        1.20 cm      LV e' lateral:   11.70 cm/s LVOT diam:     2.00 cm      LV E/e' lateral: 8.3 LV SV:         64 LV SV Index:   37 LVOT Area:     3.14 cm  LV Volumes (MOD) LV vol d, MOD A4C: 117.0 ml LV vol s, MOD A4C: 65.7 ml LV SV MOD A4C:     117.0 ml RIGHT VENTRICLE RV Basal diam:  2.90 cm RV S prime:     9.79 cm/s TAPSE (M-mode): 1.8 cm LEFT ATRIUM             Index        RIGHT ATRIUM           Index LA diam:        5.20 cm 3.00 cm/m   RA Area:      18.70 cm LA Vol (A2C):   67.3 ml 38.85 ml/m  RA Volume:   50.40 ml  29.09 ml/m LA Vol (A4C):   68.7 ml 39.65 ml/m LA Biplane Vol: 67.9 ml 39.19 ml/m  AORTIC VALVE LVOT Vmax:   97.40  cm/s LVOT Vmean:  62.900 cm/s LVOT VTI:    0.203 m  AORTA Ao Root diam: 3.40 cm Ao Asc diam:  3.70 cm MITRAL VALVE               TRICUSPID VALVE MV Area (PHT): 4.89 cm    TR Peak grad:   41.5 mmHg MV Decel Time: 155 msec    TR Vmax:        322.00 cm/s MV E velocity: 96.70 cm/s MV A velocity: 83.20 cm/s  SHUNTS MV E/A ratio:  1.16        Systemic VTI:  0.20 m                            Systemic Diam: 2.00 cm Charlton Haws MD Electronically signed by Charlton Haws MD Signature Date/Time: 07/07/2023/11:47:54 AM    Final    DG CHEST PORT 1 VIEW  Result Date: 07/06/2023 CLINICAL DATA:  Chest pain EXAM: PORTABLE CHEST - 1 VIEW COMPARISON:  05/28/2022 FINDINGS: Linear left perihilar scarring or atelectasis. Right lung clear. No pneumothorax. Heart size and mediastinal contours are within normal limits. CABG markers, and surgical clips over the left hilum. No effusion. Sternotomy wires. IMPRESSION: Left perihilar scarring or atelectasis. Electronically Signed   By: Corlis Leak M.D.   On: 07/06/2023 09:55    Cardiac Studies   Echo 07/07/23:  1. Septal , apical , inferior wall hypokinesis . Left ventricular  ejection fraction, by estimation, is 40 to 45%. The left ventricle has  mildly decreased function. The left ventricle has no regional wall motion  abnormalities. The left ventricular  internal cavity size was mildly dilated. There is mild left ventricular  hypertrophy. Left ventricular diastolic parameters were normal.   2. Right ventricular systolic function is normal. The right ventricular  size is normal. There is mildly elevated pulmonary artery systolic  pressure.   3. Left atrial size was severely dilated.   4. Right atrial size was mildly dilated.   5. The mitral valve is abnormal. Mild to moderate mitral valve   regurgitation. No evidence of mitral stenosis.   6. Tricuspid valve regurgitation is mild to moderate.   7. The aortic valve is tricuspid. Aortic valve regurgitation is not  visualized. No aortic stenosis is present.   8. The inferior vena cava is normal in size with greater than 50%  respiratory variability, suggesting right atrial pressure of 3 mmHg.   Patient Profile     77 y.o. 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   Assessment & Plan    Elevated troponin - elevated troponin to 477 --> 445 - plan ischemic evaluation once recovered from parotid gland resection - likely OP heart cath once recovered from parotid gland surgery - unclear angina - he complains of chest burner   CAD CABG 2013 with LIMA-LAD, SVG-PDA, SVG-OM2, SVG-D1 - 2013 - remains on ASA   Ischemic cardiomyopathy Acute on chronic systolic heart failure Hypertension - managed in the context of CHF - LVEF 40-45% - slight improvement - GDMT: now 97-103 mg entresto BID, 12.5 mg spironolactone, 75 mg TID hydralazine, and now 30 mg imdur - D/C'ed amlodipine - test claim for bidil - will be cost prohibitive - did not receive PD last night - crackles in bases - was on 20 mg torsemide daily at home - will defer to nephrology, but suspect he  may need IV lasix prior to discharge, but will defer to nephrology   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 Ernestene Kiel (gen surgery), ok for DAPT after POD # 5-7.     For questions or updates, please contact [facility] Please consult [email or web address] for contact info under        Signed, Marcelino Duster, PA  07/08/2023, 9:22 AM     ATTENDING ATTESTATION  I have seen, examined and evaluated the patient this PM after rounds with Micah Flesher, PA.  After reviewing all the available data and chart, we discussed the patients laboratory, study & physical findings as well as symptoms in detail.   Unfortunately,  he was on the way out for d/c as I walked in.   I agree with her findings, examination as well as impression recommendations as per our discussion.    Attending adjustments noted in italics.     Marykay Lex, MD, MS Bryan Lemma, M.D., M.S. Interventional Cardiologist  [facility]  Pager # [phone] Phone # [phone] [address] [place], [place] [address]

## 2023-07-08 NOTE — Care Management Important Message (Signed)
Important Message  Patient Details  Name: Bradley Black MRN: [id number] Date of Birth: January 09, 1946   Important Message Given:  Yes - Medicare IM     Sherilyn Banker 07/08/2023, 1:37 PM

## 2023-07-08 NOTE — Progress Notes (Addendum)
[facility] Progress Note   Subjective:   Patient seen and examined at bedside.  PD not completed overnight, unclear why.  Denies CP, SOB, edema, dizziness, abdominal pain, and n/v/d.  Hoping to go home today.   Objective Vitals:   07/08/23 0513 07/08/23 0514 07/08/23 0515 07/08/23 0742  BP:  (!) 151/71  (!) 144/68  Pulse: 98 92 92   Resp:  18  17  Temp:  98.5 F (36.9 C)  98.7 F (37.1 C)  TempSrc:  Oral  Oral  SpO2: 96% 96% 95% 96%  Weight:      Height:       Physical Exam General: alert, elderly male in NAD Heart:RRR, no mrg Lungs:CTAB, nml WOB on RA Abdomen:soft, NTND Extremities:no LE edema Dialysis Access: PD cath in abdomen, c/d/i  [facility] Weights   07/05/23 0952 07/06/23 1815  Weight: 64.4 kg 61.5 kg    Intake/Output Summary (Last 24 hours) at 07/08/2023 0902 Last data filed at 07/08/2023 0500 Gross per 24 hour  Intake 420 ml  Output 1120 ml  Net -700 ml    Additional Objective Labs: Basic Metabolic Panel: Recent Labs  Lab 07/05/23 1805 07/05/23 1900 07/05/23 2100 07/06/23 0951  NA 140 141 139 138  K 4.5 4.5 4.6 4.8  CL 111  --  108 107  CO2  --   --  15* 18*  GLUCOSE 187*  --  206* 193*  BUN 49*  --  55* 58*  CREATININE 4.50*  --  4.10* 4.44*  CALCIUM  --   --  8.6* 9.2   Liver Function Tests: Recent Labs  Lab 07/06/23 0951  AST 22  ALT 10  ALKPHOS 62  BILITOT 1.2  PROT 6.1*  ALBUMIN 3.4*   CBC: Recent Labs  Lab 07/01/23 1127 07/05/23 1035 07/05/23 1805 07/05/23 1900 07/05/23 2100  WBC 8.4  --   --   --  10.5  HGB 9.8*   < > 9.9* 9.5* 9.4*  HCT 30.4*   < > 29.0* 28.0* 28.2*  MCV 100.7*  --   --   --  101.1*  PLT 203  --   --   --  166   < > = values in this interval not displayed.   CBG: Recent Labs  Lab 07/05/23 2045 07/05/23 2207 07/06/23 0856 07/06/23 1220 07/06/23 1714  GLUCAP 192* 210* 197* 175* 145*    Studies/Results: ECHOCARDIOGRAM COMPLETE  Result Date: 07/07/2023    ECHOCARDIOGRAM REPORT    Patient Name:   Bradley Black Date of Exam: 07/07/2023 Medical Rec #:  [id number]       Height:       68.0 in Accession #:    [id number]      Weight:       135.6 lb Date of Birth:  April 06, 1946       BSA:          1.733 m Patient Age:    77 years        BP:           143/80 mmHg Patient Gender: M               HR:           78 bpm. Exam Location:  Inpatient Procedure: 2D Echo, Color Doppler and Cardiac Doppler Indications:    chest pain  History:        Patient has prior history of Echocardiogram examinations, most  recent 11/17/2020. Cardiomyopathy, Prior CABG, end stage renal                 disease; Risk Factors:Diabetes and Hypertension.  Sonographer:    Delcie Roch RDCS Referring Phys: [id number] PETER C NISHAN IMPRESSIONS  1. Septal , apical , inferior wall hypokinesis . Left ventricular ejection fraction, by estimation, is 40 to 45%. The left ventricle has mildly decreased function. The left ventricle has no regional wall motion abnormalities. The left ventricular internal cavity size was mildly dilated. There is mild left ventricular hypertrophy. Left ventricular diastolic parameters were normal.  2. Right ventricular systolic function is normal. The right ventricular size is normal. There is mildly elevated pulmonary artery systolic pressure.  3. Left atrial size was severely dilated.  4. Right atrial size was mildly dilated.  5. The mitral valve is abnormal. Mild to moderate mitral valve regurgitation. No evidence of mitral stenosis.  6. Tricuspid valve regurgitation is mild to moderate.  7. The aortic valve is tricuspid. Aortic valve regurgitation is not visualized. No aortic stenosis is present.  8. The inferior vena cava is normal in size with greater than 50% respiratory variability, suggesting right atrial pressure of 3 mmHg. FINDINGS  Left Ventricle: Septal , apical , inferior wall hypokinesis. Left ventricular ejection fraction, by estimation, is 40 to 45%. The left ventricle has mildly  decreased function. The left ventricle has no regional wall motion abnormalities. The left ventricular internal cavity size was mildly dilated. There is mild left ventricular hypertrophy. Left ventricular diastolic parameters were normal. Right Ventricle: The right ventricular size is normal. No increase in right ventricular wall thickness. Right ventricular systolic function is normal. There is mildly elevated pulmonary artery systolic pressure. The tricuspid regurgitant velocity is 3.22  m/s, and with an assumed right atrial pressure of 3 mmHg, the estimated right ventricular systolic pressure is 44.5 mmHg. Left Atrium: Left atrial size was severely dilated. Right Atrium: Right atrial size was mildly dilated. Pericardium: There is no evidence of pericardial effusion. Mitral Valve: The mitral valve is abnormal. There is mild thickening of the mitral valve leaflet(s). Mild to moderate mitral valve regurgitation. No evidence of mitral valve stenosis. Tricuspid Valve: The tricuspid valve is normal in structure. Tricuspid valve regurgitation is mild to moderate. No evidence of tricuspid stenosis. Aortic Valve: The aortic valve is tricuspid. Aortic valve regurgitation is not visualized. No aortic stenosis is present. Pulmonic Valve: The pulmonic valve was normal in structure. Pulmonic valve regurgitation is mild. No evidence of pulmonic stenosis. Aorta: The aortic root is normal in size and structure. Venous: The inferior vena cava is normal in size with greater than 50% respiratory variability, suggesting right atrial pressure of 3 mmHg. IAS/Shunts: No atrial level shunt detected by color flow Doppler.  LEFT VENTRICLE PLAX 2D LVIDd:         5.10 cm      Diastology LVIDs:         4.20 cm      LV e' medial:    6.31 cm/s LV PW:         1.10 cm      LV E/e' medial:  15.3 LV IVS:        1.20 cm      LV e' lateral:   11.70 cm/s LVOT diam:     2.00 cm      LV E/e' lateral: 8.3 LV SV:         64 LV SV Index:   37 LVOT  Area:      3.14 cm  LV Volumes (MOD) LV vol d, MOD A4C: 117.0 ml LV vol s, MOD A4C: 65.7 ml LV SV MOD A4C:     117.0 ml RIGHT VENTRICLE RV Basal diam:  2.90 cm RV S prime:     9.79 cm/s TAPSE (M-mode): 1.8 cm LEFT ATRIUM             Index        RIGHT ATRIUM           Index LA diam:        5.20 cm 3.00 cm/m   RA Area:     18.70 cm LA Vol (A2C):   67.3 ml 38.85 ml/m  RA Volume:   50.40 ml  29.09 ml/m LA Vol (A4C):   68.7 ml 39.65 ml/m LA Biplane Vol: 67.9 ml 39.19 ml/m  AORTIC VALVE LVOT Vmax:   97.40 cm/s LVOT Vmean:  62.900 cm/s LVOT VTI:    0.203 m  AORTA Ao Root diam: 3.40 cm Ao Asc diam:  3.70 cm MITRAL VALVE               TRICUSPID VALVE MV Area (PHT): 4.89 cm    TR Peak grad:   41.5 mmHg MV Decel Time: 155 msec    TR Vmax:        322.00 cm/s MV E velocity: 96.70 cm/s MV A velocity: 83.20 cm/s  SHUNTS MV E/A ratio:  1.16        Systemic VTI:  0.20 m                            Systemic Diam: 2.00 cm Charlton Haws MD Electronically signed by Charlton Haws MD Signature Date/Time: 07/07/2023/11:47:54 AM    Final    DG CHEST PORT 1 VIEW  Result Date: 07/06/2023 CLINICAL DATA:  Chest pain EXAM: PORTABLE CHEST - 1 VIEW COMPARISON:  05/28/2022 FINDINGS: Linear left perihilar scarring or atelectasis. Right lung clear. No pneumothorax. Heart size and mediastinal contours are within normal limits. CABG markers, and surgical clips over the left hilum. No effusion. Sternotomy wires. IMPRESSION: Left perihilar scarring or atelectasis. Electronically Signed   By: Corlis Leak M.D.   On: 07/06/2023 09:55    Medications:  sodium chloride 150 mL/hr at 07/06/23 7846   sodium chloride     dextrose 5 % and 0.45 % NaCl     dialysis solution 1.5% low-MG/low-CA      acetaminophen  1,000 mg Oral Q6H   amLODipine  5 mg Oral Daily   aspirin EC  81 mg Oral Daily   bacitracin  1 Application Topical TID   brimonidine  1 drop Both Eyes BID   Chlorhexidine Gluconate Cloth  6 each Topical Daily   doxazosin  2 mg Oral Daily    gentamicin cream  1 Application Topical Daily   hydrALAZINE  75 mg Oral TID   ketorolac  1 drop Both Eyes TID   levothyroxine  50 mcg Oral Q0600   sacubitril-valsartan  1 tablet Oral BID   spironolactone  12.5 mg Oral Daily   torsemide  20 mg Oral Daily    Dialysis Orders: CCPD patient at [facility]  5 exchanges , 2200 cc dwells, no daybag, 1.5h dwell   Dry wt 66kg     Assessment/Plan: Parotid mass / cancer - Underwent surgery on 07/05/23, management per ENT Chest pain - resolved. Cardiology following. ESRD -  on CCPD for 3-4 yrs. Had PD during the day on 10/5 because he got back from surgery late. PD not completed last night for unclear reasons. Can resume tonight at home if discharged or will complete here if still inpatient.  HTN/ BP - stable BP. On multiple antihypertensives, continue home meds, adjusted by cardiology. 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  BMM- Calcium in goal.  No phos. Not on binders/VDRA. Dispo - ok for d/c from renal standpoint  Virgina Norfolk, PA-C [facility] 07/08/2023,9:02 AM  LOS: 3 days

## 2023-07-08 NOTE — Progress Notes (Signed)
Mobility Specialist Progress Note:    07/08/23 1535  Mobility  Activity Ambulated with assistance in hallway;Ambulated with assistance in room  Level of Assistance Contact guard assist, steadying assist  Assistive Device Front wheel walker  Distance Ambulated (ft) 80 ft  Range of Motion/Exercises Active;All extremities  Activity Response Tolerated well  Mobility Referral Yes  $Mobility charge 1 Mobility  Mobility Specialist Start Time (ACUTE ONLY) 1525  Mobility Specialist Stop Time (ACUTE ONLY) 1535  Mobility Specialist Time Calculation (min) (ACUTE ONLY) 10 min   RN requested assistance to ambulate pt in hallway prior to d/c. Pt received at bedside. Ambulated in hallway with RW and CGA for safety. Tolerated well, asx throughout. Returned pt to room, sitting up in chair, all needs met, wife in room. Nurse notifed.   Feliciana Rossetti Mobility Specialist Please contact via Special educational needs teacher or  Rehab office at [phone]

## 2023-07-08 NOTE — Progress Notes (Signed)
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 POD#3 (07/05/23 - Jatavian Calica) s/p right parotidectomy, WLE skin, neck dissection, cervicofacial flap  Subjective: Did not receive PD overnight ; pt and wife frustrated; eager to go home Cardiology cleared for DC with close outpatient follow up and cardiac cath in next week or so given elevated troponins over the weekend JP drain low output   Objective: Vital signs in last 24 hours: Temp:  [98.1 F (36.7 C)-98.7 F (37.1 C)] 98.7 F (37.1 C) (10/07 0742) Pulse Rate:  [66-104] 92 (10/07 0515) Resp:  [16-18] 17 (10/07 0742) BP: (128-151)/(59-71) 144/68 (10/07 0742) SpO2:  [94 %-98 %] 96 % (10/07 0742)  Physical exam: General: resting comfortably in NAD. Neck flaps down. Incision c/d/I. JP Drain holding bulb suction with s/s output. Facial nerve with upper division weakness. Mild lagophthalmos 1mm with eye closure. Rest of FN intact. CN XI/XII intact.   @LABLAST2 (wbc:2,hgb:2,hct:2,plt:2) Recent Labs    07/05/23 2100 07/06/23 0951  NA 139 138  K 4.6 4.8  CL 108 107  CO2 15* 18*  GLUCOSE 206* 193*  BUN 55* 58*  CREATININE 4.10* 4.44*  CALCIUM 8.6* 9.2    Medications: I have reviewed the patient's current medications.  Assessment/Plan: POD#3 s/p right parotidectomy, neck dissection, cervicofacial flap. Wounds healing well.   - DC Today with drain and f/u in 3 days (10/10) for drain removal and suture removal - Close outpatient follow up with cardiology for cardiac cath given elevated troponin's over the weekend - Resume home dialysis tonight - Amlodipine cancelled - started imdur per cardiology   Dispo: OK to DC.    LOS: 3 days   Scarlette Ar 07/08/2023, 1:20 PM  I have personally spent 22 minutes involved in face-to-face and non-face-to-face activities for this patient on the day of the visit.  Professional  time spent includes the following activities, in addition to those noted in the documentation: preparing to see the patient (eg, review of tests), obtaining and/or reviewing separately obtained history, performing a medically appropriate examination and/or evaluation, counseling and educating the patient/family/caregiver, ordering medications, tests or procedures, referring and communicating with other healthcare professionals, documenting clinical information in the electronic or other health record, independently interpreting results and communicating results with the patient/family/caregiver, care coordination.  Electronically signed by:  Scarlette Ar, MD  Staff Physician Facial Plastic & Reconstructive Surgery Otolaryngology - Head and Neck Surgery [facility] [place]

## 2023-07-08 NOTE — Plan of Care (Signed)
?  Problem: Clinical Measurements: ?Goal: Ability to maintain clinical measurements within normal limits will improve ?Outcome: Progressing ?Goal: Will remain free from infection ?Outcome: Progressing ?Goal: Diagnostic test results will improve ?Outcome: Progressing ?  ?

## 2023-07-09 DIAGNOSIS — N186 End stage renal disease: Secondary | ICD-10-CM | POA: Diagnosis not present

## 2023-07-09 DIAGNOSIS — Z992 Dependence on renal dialysis: Secondary | ICD-10-CM | POA: Diagnosis not present

## 2023-07-09 DIAGNOSIS — Z23 Encounter for immunization: Secondary | ICD-10-CM | POA: Diagnosis not present

## 2023-07-10 ENCOUNTER — Telehealth: Payer: Self-pay

## 2023-07-10 DIAGNOSIS — Z23 Encounter for immunization: Secondary | ICD-10-CM | POA: Diagnosis not present

## 2023-07-10 DIAGNOSIS — N186 End stage renal disease: Secondary | ICD-10-CM | POA: Diagnosis not present

## 2023-07-10 DIAGNOSIS — Z992 Dependence on renal dialysis: Secondary | ICD-10-CM | POA: Diagnosis not present

## 2023-07-10 NOTE — Transitions of Care (Post Inpatient/ED Visit) (Signed)
07/10/2023  Name: Bradley Black MRN: [id number] DOB: 1946-10-01  Today's TOC FU Call Status:    Attempted to reach the patient regarding the most recent Inpatient/ED visit.  Follow Up Plan: Additional outreach attempts will be made to reach the patient to complete the Transitions of Care (Post Inpatient/ED visit) call.   Deidre Ala, RN Medical illustrator [facility] [phone]

## 2023-07-11 DIAGNOSIS — Z992 Dependence on renal dialysis: Secondary | ICD-10-CM | POA: Diagnosis not present

## 2023-07-11 DIAGNOSIS — N186 End stage renal disease: Secondary | ICD-10-CM | POA: Diagnosis not present

## 2023-07-11 DIAGNOSIS — Z23 Encounter for immunization: Secondary | ICD-10-CM | POA: Diagnosis not present

## 2023-07-11 LAB — SURGICAL PATHOLOGY

## 2023-07-12 ENCOUNTER — Other Ambulatory Visit: Payer: Self-pay | Admitting: Cardiology

## 2023-07-12 ENCOUNTER — Telehealth: Payer: Self-pay

## 2023-07-12 ENCOUNTER — Telehealth: Payer: Self-pay | Admitting: Cardiology

## 2023-07-12 ENCOUNTER — Ambulatory Visit: Payer: Medicare Other | Attending: Cardiology | Admitting: Cardiology

## 2023-07-12 ENCOUNTER — Encounter: Payer: Self-pay | Admitting: Cardiology

## 2023-07-12 VITALS — BP 148/80 | HR 72 | Ht 68.0 in | Wt 144.4 lb

## 2023-07-12 DIAGNOSIS — Z992 Dependence on renal dialysis: Secondary | ICD-10-CM | POA: Diagnosis not present

## 2023-07-12 DIAGNOSIS — N186 End stage renal disease: Secondary | ICD-10-CM

## 2023-07-12 DIAGNOSIS — Z01810 Encounter for preprocedural cardiovascular examination: Secondary | ICD-10-CM

## 2023-07-12 DIAGNOSIS — R0789 Other chest pain: Secondary | ICD-10-CM

## 2023-07-12 DIAGNOSIS — I2510 Atherosclerotic heart disease of native coronary artery without angina pectoris: Secondary | ICD-10-CM | POA: Diagnosis not present

## 2023-07-12 DIAGNOSIS — I5022 Chronic systolic (congestive) heart failure: Secondary | ICD-10-CM | POA: Diagnosis not present

## 2023-07-12 DIAGNOSIS — Z23 Encounter for immunization: Secondary | ICD-10-CM | POA: Diagnosis not present

## 2023-07-12 NOTE — Telephone Encounter (Signed)
Checking percert on the following patient for   Left Heart Cath Dx: Cpronary Artery Disease-07/25/2023 with Dr. Clifton James at 9:00

## 2023-07-12 NOTE — H&P (View-Only) (Signed)
Clinical Summary Bradley Black is a 77 y.o.male seen today for follow up of the following medical problems.    1. CAD - multiple prior interventions with subsequent CABG in 11/2011 with LIMA-LAD, SVG-PDA, SVG-OM2, and SVG-D1     - admit 07/2023 for parotid gland resection, surgery was on 10/4. Had postop chest pain.  - elevated troponin, seen by cards inpatient. Plans were for outpatient cath once recovered from surgery 07/2023 echo: LVEF 40-45%,  - no recurrent chest pains    2. Chronic combine systolic/diastolic HF -10/2018 echo: LVEF 45% - 07/2020 echo LVEF 25% - 11/2020 echo LVEF 35-40%, grade I dd, mild RV dysfunction.   - not on beta blocker due to bradycardia - no SGLT2i since in setting of ESRD  - 07/2023 echo: LVEF 40-45%,      3. PVCs Recent monitor last year showed an 18% PVC burden and he was ultimately started on Amiodarone 200mg  daily -no recent palpitations - from notes from [facility] Hews amio stopped due to abnormal thyroid studies     4. Carotid stenosis 05/2020 RICA mild, left moderate disease 08/2022 RICA 1-39%, LICA 40-59%   5. 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 PTCA of OM 1988 & 1994 b. PCI with BMS to LAD in 1997 c. RCA PCI BMS 1999 & 2000 d. s/p CABG in 11/2011 with LIMA-LAD, SVG-PDA, SVG-OM2, and SVG-D1   Cataract    Chronic back pain    CKD (chronic kidney disease), stage III (HCC)    Cyst of bursa    R shoulder   Diabetic retinopathy (HCC)    DM (diabetes mellitus), type 2 with renal complications (HCC)    Essential hypertension    Frequent PVCs    GERD (gastroesophageal reflux disease)    Hypertensive retinopathy    Ischemic cardiomyopathy 11/2011   Intra-OP TEE: EF 40-45%, no regional WMA; improved Anterior WM  post CABG.   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     brimonidine (ALPHAGAN) 0.2 % ophthalmic solution Place 1 drop into both eyes 2 (two) times daily.     Cholecalciferol (VITAMIN D3) 25 MCG (1000 UT) CAPS Take 1 capsule (1,000 Units total) by mouth daily at 6 (six) AM. 60 capsule 6   cyclobenzaprine (FLEXERIL) 5 MG tablet Take 1 tablet (5 mg total) by mouth 3 (three) times daily as needed for muscle spasms. (Patient not taking: Reported on 06/19/2023) 30 tablet 1   doxazosin (CARDURA) 1 MG tablet Take 2 mg by mouth daily.     ferrous sulfate 325 (65 FE) MG tablet Take 325 mg by mouth daily with breakfast.     hydrALAZINE (APRESOLINE) 50 MG tablet TAKE ONE AND ONE-HALF TABLETS (75MG  TOTAL) BY MOUTH THREE TIMES DAILY 270 tablet 1   isosorbide mononitrate (  IMDUR) 30 MG 24 hr tablet Take 1 tablet (30 mg total) by mouth daily. 30 tablet 1   ketorolac (ACULAR) 0.5 % ophthalmic solution Place 1 drop into both eyes in the morning, at noon, and at bedtime.     levothyroxine (SYNTHROID) 50 MCG tablet TAKE ONE TABLET ( TOTAL) BY MOUTH DAILY BEFORE BREAKFAST 90 tablet 1   multivitamin (RENA-VIT) TABS tablet Take 1 tablet by mouth daily.     pantoprazole (PROTONIX) 40 MG tablet TAKE ONE TABLET BY MOUTH TWICE A DAY 180 tablet 0   pravastatin (PRAVACHOL) 20 MG tablet TAKE ONE TABLET (20MG  TOTAL) BY MOUTH DAILY 90 tablet 3   sacubitril-valsartan (ENTRESTO) 97-103 MG TAKE ONE TABLET BY MOUTH TWICE A DAY 60 tablet 3   sildenafil (VIAGRA) 100 MG tablet Take 0.5-1 tablets (50-100 mg total) by mouth daily as needed for erectile dysfunction. 60 tablet 1   spironolactone (ALDACTONE)  25 MG tablet Take 0.5 tablets (12.5 mg total) by mouth daily. 15 tablet 6   torsemide (DEMADEX) 20 MG tablet Take 20 mg by mouth daily.     No current facility-administered medications for this visit.     Past Surgical History:  Procedure Laterality Date   AV FISTULA PLACEMENT Left 07/28/2020   Procedure: LEFT ARM ARTERIOVENOUS (AV) FISTULA  CREATION;  Surgeon: Larina Earthly, MD;  Location: [facility];  Service: Vascular;  Laterality: Left;   BACK SURGERY  10/01/1968   BIOPSY  01/25/2022   Procedure: BIOPSY;  Surgeon: Corbin Ade, MD;  Location: [facility];  Service: Endoscopy;;   CARDIAC CATHETERIZATION  10/02/2011   CATARACT EXTRACTION W/PHACO Left 09/14/2022   Procedure: CATARACT EXTRACTION PHACO AND INTRAOCULAR LENS PLACEMENT (IOC);  Surgeon: Fabio Pierce, MD;  Location: [facility];  Service: Ophthalmology;  Laterality: Left;  CDE: 8.66   CATARACT EXTRACTION W/PHACO Right 09/28/2022   Procedure: CATARACT EXTRACTION PHACO AND INTRAOCULAR LENS PLACEMENT (IOC);  Surgeon: Fabio Pierce, MD;  Location: [facility];  Service: Ophthalmology;  Laterality: Right;  CDE 7.79   COLONOSCOPY WITH PROPOFOL N/A 01/25/2022   Procedure: COLONOSCOPY WITH PROPOFOL;  Surgeon: Corbin Ade, MD;  Location: [facility];  Service: Endoscopy;  Laterality: N/A;  10:30am   CORONARY ANGIOPLASTY  10/01/1992   OM   CORONARY ANGIOPLASTY  10/02/1995   LAD   CORONARY ANGIOPLASTY WITH STENT PLACEMENT  10/01/1997   RCA   CORONARY ANGIOPLASTY WITH STENT PLACEMENT  10/02/1999   RCA   CORONARY ARTERY BYPASS GRAFT  12/27/2011   Procedure: CORONARY ARTERY BYPASS GRAFTING (CABG);  Surgeon: Purcell Nails, MD;  Location: [facility] OR;  Service: Open Heart Surgery;  Laterality: N/A;  Times four. On pump. Using endoscopically harvested right greater saphenous vein and left internal mammary artery.    ESOPHAGOGASTRODUODENOSCOPY (EGD) WITH PROPOFOL N/A 01/25/2022   Procedure: ESOPHAGOGASTRODUODENOSCOPY (EGD) WITH PROPOFOL;   Surgeon: Corbin Ade, MD;  Location: [facility];  Service: Endoscopy;  Laterality: N/A;   HERNIA REPAIR Bilateral    INCISION AND DRAINAGE OF WOUND  10/01/2004   axilla   INSERTION OF DIALYSIS CATHETER Right 07/25/2020   Procedure: INSERTION OF DIALYSIS CATHETER;  Surgeon: Lucretia Roers, MD;  Location: [facility];  Service: General;  Laterality: Right;   INTRAOPERATIVE TRANSESOPHAGEAL ECHOCARDIOGRAM  12/27/2011   Global hypokinesis with EF of 40-45%, improved LAD distribution wall motion.   LEFT HEART CATHETERIZATION WITH CORONARY ANGIOGRAM N/A 12/13/2011   Procedure: LEFT HEART CATHETERIZATION WITH CORONARY ANGIOGRAM;  Surgeon: Marykay Lex,  MD;  Location: [facility] C[facility] LAB;  Service: Cardiovascular;  Laterality: N/A;   LUMBAR FUSION     MASS EXCISION  10/24/2011   R arm   PAROTIDECTOMY Right 07/05/2023   Procedure: 1. Right radical parotidectomy with wide local excision facial skin 3x3cm, dissection and preservation of facial nerve (CPT 42415, 11644);  Surgeon: Scarlette Ar, MD;  Location: [facility] OR;  Service: ENT;  Laterality: Right;   POLYPECTOMY  01/25/2022   Procedure: POLYPECTOMY;  Surgeon: Corbin Ade, MD;  Location: [facility];  Service: Endoscopy;;   RADICAL NECK DISSECTION Right 07/05/2023   Procedure: 2. Right modified radical neck dissection 1-3 (CPT [id number]) 3. Cervicofacial rotation advancement flap 10x10cm for closure of 3x3cm defect (CPT 14040);  Surgeon: Scarlette Ar, MD;  Location: [facility] OR;  Service: ENT;  Laterality: Right;   RIGHT HEART CATH N/A 07/12/2020   Procedure: RIGHT HEART CATH;  Surgeon: Lyn Records, MD;  Location: [facility] INVASIVE CV LAB;  Service: Cardiovascular;  Laterality: N/A;   TRANSESOPHAGEAL ECHOCARDIOGRAM  10/02/2011     No Known Allergies    Family History  Problem Relation Age of Onset   Cancer Mother        breast   Cancer Father        bone   Rhonin cancer Neg Hx      Social History Bradley Black reports that he quit smoking about 36  years ago. His smoking use included cigarettes. He started smoking about 51 years ago. He has a 15 pack-year smoking history. He has never used smokeless tobacco. Bradley Black reports no history of alcohol use.   Review of Systems CONSTITUTIONAL: No weight loss, fever, chills, weakness or fatigue.  HEENT: Eyes: No visual loss, blurred vision, double vision or yellow sclerae.No hearing loss, sneezing, congestion, runny nose or sore throat.  SKIN: No rash or itching.  CARDIOVASCULAR: per hpi RESPIRATORY: No shortness of breath, cough or sputum.  GASTROINTESTINAL: No anorexia, nausea, vomiting or diarrhea. No abdominal pain or blood.  GENITOURINARY: No burning on urination, no polyuria NEUROLOGICAL: No headache, dizziness, syncope, paralysis, ataxia, numbness or tingling in the extremities. No change in bowel or bladder control.  MUSCULOSKELETAL: No muscle, back pain, joint pain or stiffness.  LYMPHATICS: No enlarged nodes. No history of splenectomy.  PSYCHIATRIC: No history of depression or anxiety.  ENDOCRINOLOGIC: No reports of sweating, cold or heat intolerance. No polyuria or polydipsia.  [name]   Physical Examination Today's Vitals   07/12/23 0920 07/12/23 0958  BP: (!) 144/82 (!) 148/80  Pulse: 72   SpO2: 98%   Weight: 144 lb 6.4 oz (65.5 kg)   Height: 5\' 8"  (1.727 m)    Body mass index is 21.96 kg/m.  Gen: resting comfortably, no acute distress HEENT: no scleral icterus, pupils equal round and reactive, no palptable cervical adenopathy,  CV: RRR, no m/rg, no jvd Resp: Clear to auscultation bilaterally GI: abdomen is soft, non-tender, non-distended, normal bowel sounds, no hepatosplenomegaly MSK: extremities are warm, no edema.  Skin: warm, no rash Neuro:  no focal deficits Psych: appropriate affect      Assessment and Plan   1. CAD/postoperative NSTEMI -long history of CAD as reported above with CABG in 2013 - recent parotid gland surgery, s/p surgery chest pains and  elevated troponins - cath postponsed in postop period with plans at discharge for outpatient cath - surgical site is healing well, he is for stitches removal next week. Would plan for left heart cath the following week.  2. Chronic HFmrEF - no symptoms - no beta blocker due to prior bradycardia, no SGLT2i due to ESRD - continue current meds  3. ESRD - he is on peritoneal HD at home  Informed Consent   Shared Decision Making/Informed Consent The risks [stroke (1 in 1000), death (1 in 1000), kidney failure [usually temporary] (1 in 500), bleeding (1 in 200), allergic reaction [possibly serious] (1 in 200)], benefits (diagnostic support and management of coronary artery disease) and alternatives of a cardiac catheterization were discussed in detail with Bradley Black and he is willing to proceed.        Antoine Poche, M.D.

## 2023-07-12 NOTE — Progress Notes (Signed)
Clinical Summary Bradley Black is a 77 y.o.male seen today for follow up of the following medical problems.    1. CAD - multiple prior interventions with subsequent CABG in 11/2011 with LIMA-LAD, SVG-PDA, SVG-OM2, and SVG-D1     - admit 07/2023 for parotid gland resection, surgery was on 10/4. Had postop chest pain.  - elevated troponin, seen by cards inpatient. Plans were for outpatient cath once recovered from surgery 07/2023 echo: LVEF 40-45%,  - no recurrent chest pains    2. Chronic combine systolic/diastolic HF -10/2018 echo: LVEF 45% - 07/2020 echo LVEF 25% - 11/2020 echo LVEF 35-40%, grade I dd, mild RV dysfunction.   - not on beta blocker due to bradycardia - no SGLT2i since in setting of ESRD  - 07/2023 echo: LVEF 40-45%,      3. PVCs Recent monitor last year showed an 18% PVC burden and he was ultimately started on Amiodarone 200mg  daily -no recent palpitations - from notes from [place] [facility] Hews amio stopped due to abnormal thyroid studies     4. Carotid stenosis 05/2020 RICA mild, left moderate disease 08/2022 RICA 1-39%, LICA 40-59%   5. 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 PTCA of OM 1988 & 1994 b. PCI with BMS to LAD in 1997 c. RCA PCI BMS 1999 & 2000 d. s/p CABG in 11/2011 with LIMA-LAD, SVG-PDA, SVG-OM2, and SVG-D1   Cataract    Chronic back pain    CKD (chronic kidney disease), stage III (HCC)    Cyst of bursa    R shoulder   Diabetic retinopathy (HCC)    DM (diabetes mellitus), type 2 with renal complications (HCC)    Essential hypertension    Frequent PVCs    GERD (gastroesophageal reflux disease)    Hypertensive retinopathy    Ischemic cardiomyopathy 11/2011   Intra-OP TEE: EF 40-45%, no regional WMA; improved Anterior WM  post CABG.   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     brimonidine (ALPHAGAN) 0.2 % ophthalmic solution Place 1 drop into both eyes 2 (two) times daily.     Cholecalciferol (VITAMIN D3) 25 MCG (1000 UT) CAPS Take 1 capsule (1,000 Units total) by mouth daily at 6 (six) AM. 60 capsule 6   cyclobenzaprine (FLEXERIL) 5 MG tablet Take 1 tablet (5 mg total) by mouth 3 (three) times daily as needed for muscle spasms. (Patient not taking: Reported on 06/19/2023) 30 tablet 1   doxazosin (CARDURA) 1 MG tablet Take 2 mg by mouth daily.     ferrous sulfate 325 (65 FE) MG tablet Take 325 mg by mouth daily with breakfast.     hydrALAZINE (APRESOLINE) 50 MG tablet TAKE ONE AND ONE-HALF TABLETS (75MG  TOTAL) BY MOUTH THREE TIMES DAILY 270 tablet 1   isosorbide mononitrate (  IMDUR) 30 MG 24 hr tablet Take 1 tablet (30 mg total) by mouth daily. 30 tablet 1   ketorolac (ACULAR) 0.5 % ophthalmic solution Place 1 drop into both eyes in the morning, at noon, and at bedtime.     levothyroxine (SYNTHROID) 50 MCG tablet TAKE ONE TABLET ( TOTAL) BY MOUTH DAILY BEFORE BREAKFAST 90 tablet 1   multivitamin (RENA-VIT) TABS tablet Take 1 tablet by mouth daily.     pantoprazole (PROTONIX) 40 MG tablet TAKE ONE TABLET BY MOUTH TWICE A DAY 180 tablet 0   pravastatin (PRAVACHOL) 20 MG tablet TAKE ONE TABLET (20MG  TOTAL) BY MOUTH DAILY 90 tablet 3   sacubitril-valsartan (ENTRESTO) 97-103 MG TAKE ONE TABLET BY MOUTH TWICE A DAY 60 tablet 3   sildenafil (VIAGRA) 100 MG tablet Take 0.5-1 tablets (50-100 mg total) by mouth daily as needed for erectile dysfunction. 60 tablet 1   spironolactone (ALDACTONE)  25 MG tablet Take 0.5 tablets (12.5 mg total) by mouth daily. 15 tablet 6   torsemide (DEMADEX) 20 MG tablet Take 20 mg by mouth daily.     No current facility-administered medications for this visit.     Past Surgical History:  Procedure Laterality Date   AV FISTULA PLACEMENT Left 07/28/2020   Procedure: LEFT ARM ARTERIOVENOUS (AV) FISTULA  CREATION;  Surgeon: Larina Earthly, MD;  Location: [facility];  Service: Vascular;  Laterality: Left;   BACK SURGERY  10/01/1968   BIOPSY  01/25/2022   Procedure: BIOPSY;  Surgeon: Corbin Ade, MD;  Location: [facility];  Service: Endoscopy;;   CARDIAC CATHETERIZATION  10/02/2011   CATARACT EXTRACTION W/PHACO Left 09/14/2022   Procedure: CATARACT EXTRACTION PHACO AND INTRAOCULAR LENS PLACEMENT (IOC);  Surgeon: Fabio Pierce, MD;  Location: [facility];  Service: Ophthalmology;  Laterality: Left;  CDE: 8.66   CATARACT EXTRACTION W/PHACO Right 09/28/2022   Procedure: CATARACT EXTRACTION PHACO AND INTRAOCULAR LENS PLACEMENT (IOC);  Surgeon: Fabio Pierce, MD;  Location: [facility];  Service: Ophthalmology;  Laterality: Right;  CDE 7.79   COLONOSCOPY WITH PROPOFOL N/A 01/25/2022   Procedure: COLONOSCOPY WITH PROPOFOL;  Surgeon: Corbin Ade, MD;  [facility];  Service: Endoscopy;  Laterality: N/A;  10:30am   CORONARY ANGIOPLASTY  10/01/1992   OM   CORONARY ANGIOPLASTY  10/02/1995   LAD   CORONARY ANGIOPLASTY WITH STENT PLACEMENT  10/01/1997   RCA   CORONARY ANGIOPLASTY WITH STENT PLACEMENT  10/02/1999   RCA   CORONARY ARTERY BYPASS GRAFT  12/27/2011   Procedure: CORONARY ARTERY BYPASS GRAFTING (CABG);  Surgeon: Purcell Nails, MD;  Location: [facility] OR;  Service: Open Heart Surgery;  Laterality: N/A;  Times four. On pump. Using endoscopically harvested right greater saphenous vein and left internal mammary artery.    ESOPHAGOGASTRODUODENOSCOPY (EGD) WITH PROPOFOL N/A 01/25/2022   Procedure: ESOPHAGOGASTRODUODENOSCOPY (EGD) WITH PROPOFOL;   Surgeon: Corbin Ade, MD;  [facility];  Service: Endoscopy;  Laterality: N/A;   HERNIA REPAIR Bilateral    INCISION AND DRAINAGE OF WOUND  10/01/2004   axilla   INSERTION OF DIALYSIS CATHETER Right 07/25/2020   Procedure: INSERTION OF DIALYSIS CATHETER;  Surgeon: Lucretia Roers, MD;  Location: [facility];  Service: General;  Laterality: Right;   INTRAOPERATIVE TRANSESOPHAGEAL ECHOCARDIOGRAM  12/27/2011   Global hypokinesis with EF of 40-45%, improved LAD distribution wall motion.   LEFT HEART CATHETERIZATION WITH CORONARY ANGIOGRAM N/A 12/13/2011   Procedure: LEFT HEART CATHETERIZATION WITH CORONARY ANGIOGRAM;  Surgeon: Marykay Lex,  MD;  Location: [facility];  Service: Cardiovascular;  Laterality: N/A;   LUMBAR FUSION     MASS EXCISION  10/24/2011   R arm   PAROTIDECTOMY Right 07/05/2023   Procedure: 1. Right radical parotidectomy with wide local excision facial skin 3x3cm, dissection and preservation of facial nerve (CPT 42415, 11644);  Surgeon: Scarlette Ar, MD;  Location: [facility] OR;  Service: ENT;  Laterality: Right;   POLYPECTOMY  01/25/2022   Procedure: POLYPECTOMY;  Surgeon: Corbin Ade, MD;  [facility];  Service: Endoscopy;;   RADICAL NECK DISSECTION Right 07/05/2023   Procedure: 2. Right modified radical neck dissection 1-3 (CPT [id number]) 3. Cervicofacial rotation advancement flap 10x10cm for closure of 3x3cm defect (CPT 14040);  Surgeon: Scarlette Ar, MD;  Location: [facility] OR;  Service: ENT;  Laterality: Right;   RIGHT HEART CATH N/A 07/12/2020   Procedure: RIGHT HEART CATH;  Surgeon: Lyn Records, MD;  Location: [facility] INVASIVE CV LAB;  Service: Cardiovascular;  Laterality: N/A;   TRANSESOPHAGEAL ECHOCARDIOGRAM  10/02/2011     No Known Allergies    Family History  Problem Relation Age of Onset   Cancer Mother        breast   Cancer Father        bone   Rhonin cancer Neg Hx      Social History Bradley Black reports that he quit smoking about 36  years ago. His smoking use included cigarettes. He started smoking about 51 years ago. He has a 15 pack-year smoking history. He has never used smokeless tobacco. Bradley Black reports no history of alcohol use.   Review of Systems CONSTITUTIONAL: No weight loss, fever, chills, weakness or fatigue.  HEENT: Eyes: No visual loss, blurred vision, double vision or yellow sclerae.No hearing loss, sneezing, congestion, runny nose or sore throat.  SKIN: No rash or itching.  CARDIOVASCULAR: per hpi RESPIRATORY: No shortness of breath, cough or sputum.  GASTROINTESTINAL: No anorexia, nausea, vomiting or diarrhea. No abdominal pain or blood.  GENITOURINARY: No burning on urination, no polyuria NEUROLOGICAL: No headache, dizziness, syncope, paralysis, ataxia, numbness or tingling in the extremities. No change in bowel or bladder control.  MUSCULOSKELETAL: No muscle, back pain, joint pain or stiffness.  LYMPHATICS: No enlarged nodes. No history of splenectomy.  PSYCHIATRIC: No history of depression or anxiety.  ENDOCRINOLOGIC: No reports of sweating, cold or heat intolerance. No polyuria or polydipsia.  [name]   Physical Examination Today's Vitals   07/12/23 0920 07/12/23 0958  BP: (!) 144/82 (!) 148/80  Pulse: 72   SpO2: 98%   Weight: 144 lb 6.4 oz (65.5 kg)   Height: 5\' 8"  (1.727 m)    Body mass index is 21.96 kg/m.  Gen: resting comfortably, no acute distress HEENT: no scleral icterus, pupils equal round and reactive, no palptable cervical adenopathy,  CV: RRR, no m/rg, no jvd Resp: Clear to auscultation bilaterally GI: abdomen is soft, non-tender, non-distended, normal bowel sounds, no hepatosplenomegaly MSK: extremities are warm, no edema.  Skin: warm, no rash Neuro:  no focal deficits Psych: appropriate affect      Assessment and Plan   1. CAD/postoperative NSTEMI -long history of CAD as reported above with CABG in 2013 - recent parotid gland surgery, s/p surgery chest pains and  elevated troponins - cath postponsed in postop period with plans at discharge for outpatient cath - surgical site is healing well, he is for stitches removal next week. Would plan for left heart cath the following week.  2. Chronic HFmrEF - no symptoms - no beta blocker due to prior bradycardia, no SGLT2i due to ESRD - continue current meds  3. ESRD - he is on peritoneal HD at home  Informed Consent   Shared Decision Making/Informed Consent The risks [stroke (1 in 1000), death (1 in 1000), kidney failure [usually temporary] (1 in 500), bleeding (1 in 200), allergic reaction [possibly serious] (1 in 200)], benefits (diagnostic support and management of coronary artery disease) and alternatives of a cardiac catheterization were discussed in detail with Bradley Black and he is willing to proceed.        Antoine Poche, M.D.

## 2023-07-12 NOTE — Patient Instructions (Signed)
Medication Instructions:  Your physician recommends that you continue on your current medications as directed. Please refer to the Current Medication list given to you today.   Labwork: CBC/BMP at [facility] 1 week before procedure  Testing/Procedures: Left Heart Cath at [facility]  Follow-Up: Your physician recommends that you schedule a follow-up appointment in: 3 week follow up after heart cath  Any Other Special Instructions Will Be Listed Below (If Applicable).   [facility] DEPT OF [facility] [address] [place] [address] Dept: [phone] Loc: [phone]  Giovoni Janoah Menna  07/12/2023  You are scheduled for a Cardiac Catheterization on Thursday, October 24 with Dr. Verne Carrow.  1. Please arrive at the [facility] (Main Entrance A) at [facility]: [address] [place], [place] [address] at 7:00 AM (This time is 2 hour(s) before your procedure to ensure your preparation). Free valet parking service is available. You will check in at ADMITTING. The support person will be asked to wait in the waiting room.  It is OK to have someone drop you off and come back when you are ready to be discharged.    Special note: Every effort is made to have your procedure done on time. Please understand that emergencies sometimes delay scheduled procedures.  2. Diet: Do not eat solid foods after midnight.  The patient may have clear liquids until 5am upon the day of the procedure.  3. Labs: You will need to have blood drawn on Thursday July 18, 2023 a week before your procedure  4. Medication instructions in preparation for your procedure:   Contrast Allergy: No   Stop taking Spironolactone, Torsemide and Entresto the day of and after your procedure    On the morning of your procedure, take your Aspirin 81 mg and any morning medicines NOT listed above.  You may use sips of water.  5. Plan  to go home the same day, you will only stay overnight if medically necessary. 6. Bring a current list of your medications and current insurance cards. 7. You MUST have a responsible person to drive you home. 8. Someone MUST be with you the first 24 hours after you arrive home or your discharge will be delayed. 9. Please wear clothes that are easy to get on and off and wear slip-on shoes.  Thank you for allowing [place] to care for you!   -- [facility]   If you need a refill on your cardiac medications before your next appointment, please call your pharmacy.

## 2023-07-12 NOTE — Transitions of Care (Post Inpatient/ED Visit) (Signed)
07/12/2023  Name: Khiem Amedeo Soriano MRN: [id number] DOB: 01-02-1946  Today's TOC FU Call Status:    Attempted to reach the patient regarding the most recent Inpatient/ED visit.  Follow Up Plan: No further outreach attempts will be made at this time. We have been unable to contact the patient.  Deidre Ala, RN Medical illustrator [facility] [phone]

## 2023-07-13 DIAGNOSIS — N186 End stage renal disease: Secondary | ICD-10-CM | POA: Diagnosis not present

## 2023-07-13 DIAGNOSIS — Z992 Dependence on renal dialysis: Secondary | ICD-10-CM | POA: Diagnosis not present

## 2023-07-13 DIAGNOSIS — Z23 Encounter for immunization: Secondary | ICD-10-CM | POA: Diagnosis not present

## 2023-07-14 DIAGNOSIS — Z23 Encounter for immunization: Secondary | ICD-10-CM | POA: Diagnosis not present

## 2023-07-14 DIAGNOSIS — Z992 Dependence on renal dialysis: Secondary | ICD-10-CM | POA: Diagnosis not present

## 2023-07-14 DIAGNOSIS — N186 End stage renal disease: Secondary | ICD-10-CM | POA: Diagnosis not present

## 2023-07-15 ENCOUNTER — Telehealth: Payer: Self-pay

## 2023-07-15 ENCOUNTER — Telehealth: Payer: Self-pay | Admitting: Radiation Oncology

## 2023-07-15 DIAGNOSIS — Z23 Encounter for immunization: Secondary | ICD-10-CM | POA: Diagnosis not present

## 2023-07-15 DIAGNOSIS — N186 End stage renal disease: Secondary | ICD-10-CM | POA: Diagnosis not present

## 2023-07-15 DIAGNOSIS — Z992 Dependence on renal dialysis: Secondary | ICD-10-CM | POA: Diagnosis not present

## 2023-07-15 NOTE — Telephone Encounter (Signed)
10/14 @ 9:31 am Left voicemail on patient and patient's spouse contact numbers for patient to call our office to be schedule for an consult.

## 2023-07-15 NOTE — Transitions of Care (Post Inpatient/ED Visit) (Signed)
07/15/2023  Name: Demico Treysen Ottum MRN: [id number] DOB: [birth year]  Today's TOC FU Call Status: Today's TOC FU Call Status:: Unsuccessful Call (3rd Attempt) Unsuccessful Call (3rd Attempt) Date: 07/15/23  Attempted to reach the patient regarding the most recent Inpatient/ED visit.  Follow Up Plan: No further outreach attempts will be made at this time. We have been unable to contact the patient.  Deidre Ala, RN Medical illustrator [facility] [phone]

## 2023-07-16 DIAGNOSIS — Z992 Dependence on renal dialysis: Secondary | ICD-10-CM | POA: Diagnosis not present

## 2023-07-16 DIAGNOSIS — Z23 Encounter for immunization: Secondary | ICD-10-CM | POA: Diagnosis not present

## 2023-07-16 DIAGNOSIS — N186 End stage renal disease: Secondary | ICD-10-CM | POA: Diagnosis not present

## 2023-07-17 DIAGNOSIS — Z23 Encounter for immunization: Secondary | ICD-10-CM | POA: Diagnosis not present

## 2023-07-17 DIAGNOSIS — Z131 Encounter for screening for diabetes mellitus: Secondary | ICD-10-CM | POA: Diagnosis not present

## 2023-07-17 DIAGNOSIS — Z992 Dependence on renal dialysis: Secondary | ICD-10-CM | POA: Diagnosis not present

## 2023-07-17 DIAGNOSIS — N186 End stage renal disease: Secondary | ICD-10-CM | POA: Diagnosis not present

## 2023-07-18 ENCOUNTER — Other Ambulatory Visit: Payer: Self-pay | Admitting: Cardiology

## 2023-07-18 DIAGNOSIS — Z23 Encounter for immunization: Secondary | ICD-10-CM | POA: Diagnosis not present

## 2023-07-18 DIAGNOSIS — I5022 Chronic systolic (congestive) heart failure: Secondary | ICD-10-CM | POA: Diagnosis not present

## 2023-07-18 DIAGNOSIS — Z992 Dependence on renal dialysis: Secondary | ICD-10-CM | POA: Diagnosis not present

## 2023-07-18 DIAGNOSIS — I2510 Atherosclerotic heart disease of native coronary artery without angina pectoris: Secondary | ICD-10-CM | POA: Diagnosis not present

## 2023-07-18 DIAGNOSIS — N186 End stage renal disease: Secondary | ICD-10-CM | POA: Diagnosis not present

## 2023-07-18 DIAGNOSIS — Z01810 Encounter for preprocedural cardiovascular examination: Secondary | ICD-10-CM | POA: Diagnosis not present

## 2023-07-19 DIAGNOSIS — Z23 Encounter for immunization: Secondary | ICD-10-CM | POA: Diagnosis not present

## 2023-07-19 DIAGNOSIS — N186 End stage renal disease: Secondary | ICD-10-CM | POA: Diagnosis not present

## 2023-07-19 DIAGNOSIS — Z992 Dependence on renal dialysis: Secondary | ICD-10-CM | POA: Diagnosis not present

## 2023-07-19 LAB — BASIC METABOLIC PANEL
BUN/Creatinine Ratio: 13 (ref 10–24)
BUN: 46 mg/dL — ABNORMAL HIGH (ref 8–27)
CO2: 23 mmol/L (ref 20–29)
Calcium: 10.3 mg/dL — ABNORMAL HIGH (ref 8.6–10.2)
Chloride: 103 mmol/L (ref 96–106)
Creatinine, Ser: 3.67 mg/dL — ABNORMAL HIGH (ref 0.76–1.27)
Glucose: 206 mg/dL — ABNORMAL HIGH (ref 70–99)
Potassium: 5.1 mmol/L (ref 3.5–5.2)
Sodium: 140 mmol/L (ref 134–144)
eGFR: 16 mL/min/{1.73_m2} — ABNORMAL LOW (ref >59)

## 2023-07-19 LAB — CBC
Hematocrit: 32.3 % — ABNORMAL LOW (ref 37.5–51.0)
Hemoglobin: 10.5 g/dL — ABNORMAL LOW (ref 13.0–17.7)
MCH: 32.6 pg (ref 26.6–33.0)
MCHC: 32.5 g/dL (ref 31.5–35.7)
MCV: 100 fL — ABNORMAL HIGH (ref 79–97)
Platelets: 218 10*3/uL (ref 150–450)
RBC: 3.22 x10E6/uL — ABNORMAL LOW (ref 4.14–5.80)
RDW: 12.2 % (ref 11.6–15.4)
WBC: 10.2 10*3/uL (ref 3.4–10.8)

## 2023-07-20 DIAGNOSIS — N186 End stage renal disease: Secondary | ICD-10-CM | POA: Diagnosis not present

## 2023-07-20 DIAGNOSIS — Z23 Encounter for immunization: Secondary | ICD-10-CM | POA: Diagnosis not present

## 2023-07-20 DIAGNOSIS — Z992 Dependence on renal dialysis: Secondary | ICD-10-CM | POA: Diagnosis not present

## 2023-07-21 DIAGNOSIS — N186 End stage renal disease: Secondary | ICD-10-CM | POA: Diagnosis not present

## 2023-07-21 DIAGNOSIS — Z992 Dependence on renal dialysis: Secondary | ICD-10-CM | POA: Diagnosis not present

## 2023-07-21 DIAGNOSIS — Z23 Encounter for immunization: Secondary | ICD-10-CM | POA: Diagnosis not present

## 2023-07-22 DIAGNOSIS — N186 End stage renal disease: Secondary | ICD-10-CM | POA: Diagnosis not present

## 2023-07-22 DIAGNOSIS — Z992 Dependence on renal dialysis: Secondary | ICD-10-CM | POA: Diagnosis not present

## 2023-07-22 DIAGNOSIS — Z23 Encounter for immunization: Secondary | ICD-10-CM | POA: Diagnosis not present

## 2023-07-23 DIAGNOSIS — Z23 Encounter for immunization: Secondary | ICD-10-CM | POA: Diagnosis not present

## 2023-07-23 DIAGNOSIS — N186 End stage renal disease: Secondary | ICD-10-CM | POA: Diagnosis not present

## 2023-07-23 DIAGNOSIS — Z992 Dependence on renal dialysis: Secondary | ICD-10-CM | POA: Diagnosis not present

## 2023-07-24 ENCOUNTER — Telehealth: Payer: Self-pay | Admitting: *Deleted

## 2023-07-24 DIAGNOSIS — Z23 Encounter for immunization: Secondary | ICD-10-CM | POA: Diagnosis not present

## 2023-07-24 DIAGNOSIS — N186 End stage renal disease: Secondary | ICD-10-CM | POA: Diagnosis not present

## 2023-07-24 DIAGNOSIS — Z992 Dependence on renal dialysis: Secondary | ICD-10-CM | POA: Diagnosis not present

## 2023-07-24 NOTE — Telephone Encounter (Signed)
Cardiac Catheterization scheduled at [facility] for: Thursday July 25, 2023 9 AM Arrival time [facility] Main Entrance A at: 7 AM  Nothing to eat after midnight prior to procedure, clear liquids until 5 AM day of procedure.  Medication instructions: -Hold:  Torsemide/Spironolactone/Entresto-AM of procedure -Other usual morning medications can be taken with sips of water including aspirin 81 mg.  Plan to go home the same day, you will only stay overnight if medically necessary.  You must have responsible adult to drive you home.  Someone must be with you the first 24 hours after you arrive home.  Reviewed procedure instructions with patient. Patient reports he does do peritoneal HD at home.

## 2023-07-25 ENCOUNTER — Other Ambulatory Visit (HOSPITAL_COMMUNITY): Payer: Self-pay

## 2023-07-25 ENCOUNTER — Ambulatory Visit (HOSPITAL_COMMUNITY)
Admission: RE | Disposition: A | Discharge status: Home / Self Care | Payer: Self-pay | Source: Home / Self Care | Attending: Cardiovascular Disease

## 2023-07-25 ENCOUNTER — Ambulatory Visit (HOSPITAL_COMMUNITY)
Admission: RE | Admit: 2023-07-25 | Discharge: 2023-07-25 | Disposition: A | Discharge status: Home / Self Care | Payer: Medicare Other | Attending: Cardiovascular Disease | Admitting: Cardiovascular Disease

## 2023-07-25 DIAGNOSIS — Z992 Dependence on renal dialysis: Secondary | ICD-10-CM | POA: Insufficient documentation

## 2023-07-25 DIAGNOSIS — E782 Mixed hyperlipidemia: Secondary | ICD-10-CM | POA: Diagnosis not present

## 2023-07-25 DIAGNOSIS — E1122 Type 2 diabetes mellitus with diabetic chronic kidney disease: Secondary | ICD-10-CM | POA: Diagnosis not present

## 2023-07-25 DIAGNOSIS — Z955 Presence of coronary angioplasty implant and graft: Secondary | ICD-10-CM

## 2023-07-25 DIAGNOSIS — N186 End stage renal disease: Secondary | ICD-10-CM | POA: Diagnosis not present

## 2023-07-25 DIAGNOSIS — I2582 Chronic total occlusion of coronary artery: Secondary | ICD-10-CM | POA: Diagnosis not present

## 2023-07-25 DIAGNOSIS — I493 Ventricular premature depolarization: Secondary | ICD-10-CM | POA: Insufficient documentation

## 2023-07-25 DIAGNOSIS — I25710 Atherosclerosis of autologous vein coronary artery bypass graft(s) with unstable angina pectoris: Secondary | ICD-10-CM

## 2023-07-25 DIAGNOSIS — I6523 Occlusion and stenosis of bilateral carotid arteries: Secondary | ICD-10-CM | POA: Diagnosis not present

## 2023-07-25 DIAGNOSIS — I25110 Atherosclerotic heart disease of native coronary artery with unstable angina pectoris: Secondary | ICD-10-CM | POA: Insufficient documentation

## 2023-07-25 DIAGNOSIS — I5042 Chronic combined systolic (congestive) and diastolic (congestive) heart failure: Secondary | ICD-10-CM | POA: Diagnosis not present

## 2023-07-25 DIAGNOSIS — Z87891 Personal history of nicotine dependence: Secondary | ICD-10-CM | POA: Diagnosis not present

## 2023-07-25 DIAGNOSIS — I132 Hypertensive heart and chronic kidney disease with heart failure and with stage 5 chronic kidney disease, or end stage renal disease: Secondary | ICD-10-CM | POA: Insufficient documentation

## 2023-07-25 DIAGNOSIS — I252 Old myocardial infarction: Secondary | ICD-10-CM | POA: Diagnosis not present

## 2023-07-25 DIAGNOSIS — I255 Ischemic cardiomyopathy: Secondary | ICD-10-CM | POA: Diagnosis not present

## 2023-07-25 DIAGNOSIS — Z79899 Other long term (current) drug therapy: Secondary | ICD-10-CM | POA: Diagnosis not present

## 2023-07-25 DIAGNOSIS — C07 Malignant neoplasm of parotid gland: Secondary | ICD-10-CM | POA: Insufficient documentation

## 2023-07-25 DIAGNOSIS — I2510 Atherosclerotic heart disease of native coronary artery without angina pectoris: Secondary | ICD-10-CM | POA: Diagnosis present

## 2023-07-25 DIAGNOSIS — Z951 Presence of aortocoronary bypass graft: Secondary | ICD-10-CM | POA: Insufficient documentation

## 2023-07-25 DIAGNOSIS — Z23 Encounter for immunization: Secondary | ICD-10-CM | POA: Diagnosis not present

## 2023-07-25 DIAGNOSIS — R0789 Other chest pain: Secondary | ICD-10-CM

## 2023-07-25 DIAGNOSIS — E785 Hyperlipidemia, unspecified: Secondary | ICD-10-CM | POA: Diagnosis present

## 2023-07-25 HISTORY — PX: LEFT HEART CATH AND CORS/GRAFTS ANGIOGRAPHY: CATH118250

## 2023-07-25 HISTORY — PX: CORONARY STENT INTERVENTION: CATH118234

## 2023-07-25 LAB — GLUCOSE, CAPILLARY
Glucose-Capillary: 121 mg/dL — ABNORMAL HIGH (ref 70–99)
Glucose-Capillary: 101 mg/dL — ABNORMAL HIGH (ref 70–99)
Glucose-Capillary: 105 mg/dL — ABNORMAL HIGH (ref 70–99)

## 2023-07-25 LAB — POCT ACTIVATED CLOTTING TIME
Activated Clotting Time: 238 s
Activated Clotting Time: 244 s
Activated Clotting Time: 220 s
Activated Clotting Time: 177 s

## 2023-07-25 SURGERY — LEFT HEART CATH AND CORS/GRAFTS ANGIOGRAPHY
Anesthesia: LOCAL

## 2023-07-25 MED ORDER — SODIUM CHLORIDE 0.9 % IV SOLN: 250.0000 mL | INTRAVENOUS | Status: DC | PRN

## 2023-07-25 MED ORDER — HEPARIN (PORCINE) IN NACL 1000-0.9 UT/500ML-% IV SOLN
INTRAVENOUS | Status: DC | PRN
  Administered 2023-07-25 (×2): 500 mL

## 2023-07-25 MED ORDER — CLOPIDOGREL BISULFATE 75 MG PO TABS
75.0000 mg | ORAL_TABLET | Freq: Every day | ORAL | 1 refills | Status: DC
  Filled 2023-07-25: qty 90, 90d supply, fill #0

## 2023-07-25 MED ORDER — CLOPIDOGREL BISULFATE 75 MG PO TABS: 75.0000 mg | ORAL_TABLET | Freq: Every day | ORAL | Status: DC

## 2023-07-25 MED ORDER — SODIUM CHLORIDE 0.9% FLUSH: 3.0000 mL | Freq: Two times a day (BID) | INTRAVENOUS | Status: DC

## 2023-07-25 MED ORDER — ACETAMINOPHEN 325 MG PO TABS: 650.0000 mg | ORAL_TABLET | ORAL | Status: DC | PRN

## 2023-07-25 MED ORDER — VERAPAMIL HCL 2.5 MG/ML IV SOLN
INTRAVENOUS | Status: AC
  Filled 2023-07-25: qty 2

## 2023-07-25 MED ORDER — ISOSORBIDE MONONITRATE ER 30 MG PO TB24: 30.0000 mg | ORAL_TABLET | Freq: Every day | ORAL | Status: DC

## 2023-07-25 MED ORDER — FENTANYL CITRATE (PF) 100 MCG/2ML IJ SOLN
INTRAMUSCULAR | Status: AC
  Filled 2023-07-25: qty 2

## 2023-07-25 MED ORDER — HEPARIN SODIUM (PORCINE) 1000 UNIT/ML IJ SOLN
INTRAMUSCULAR | Status: AC
  Filled 2023-07-25: qty 10

## 2023-07-25 MED ORDER — SODIUM CHLORIDE 0.9% FLUSH: 3.0000 mL | INTRAVENOUS | Status: DC | PRN

## 2023-07-25 MED ORDER — FAMOTIDINE IN NACL 20-0.9 MG/50ML-% IV SOLN
INTRAVENOUS | Status: AC
  Filled 2023-07-25: qty 50

## 2023-07-25 MED ORDER — FAMOTIDINE IN NACL 20-0.9 MG/50ML-% IV SOLN
INTRAVENOUS | Status: DC | PRN
  Administered 2023-07-25: 20 mg via INTRAVENOUS

## 2023-07-25 MED ORDER — CLOPIDOGREL BISULFATE 300 MG PO TABS
ORAL_TABLET | ORAL | Status: DC | PRN
  Administered 2023-07-25: 600 mg via ORAL

## 2023-07-25 MED ORDER — MIDAZOLAM HCL 2 MG/2ML IJ SOLN
INTRAMUSCULAR | Status: AC
  Filled 2023-07-25: qty 2

## 2023-07-25 MED ORDER — IOHEXOL 350 MG/ML SOLN
INTRAVENOUS | Status: DC | PRN
  Administered 2023-07-25: 125 mL

## 2023-07-25 MED ORDER — ATORVASTATIN CALCIUM 40 MG PO TABS: 40.0000 mg | ORAL_TABLET | Freq: Every day | ORAL | 3 refills | Status: DC

## 2023-07-25 MED ORDER — FENTANYL CITRATE (PF) 100 MCG/2ML IJ SOLN
INTRAMUSCULAR | Status: DC | PRN
  Administered 2023-07-25: 25 ug via INTRAVENOUS

## 2023-07-25 MED ORDER — SPIRONOLACTONE 12.5 MG HALF TABLET: 12.5000 mg | ORAL_TABLET | Freq: Every day | ORAL | Status: DC

## 2023-07-25 MED ORDER — SODIUM CHLORIDE 0.9 % IV SOLN
INTRAVENOUS | Status: DC | PRN
  Administered 2023-07-25: 10 mL/h via INTRAVENOUS

## 2023-07-25 MED ORDER — LEVOTHYROXINE SODIUM 50 MCG PO TABS: 50.0000 ug | ORAL_TABLET | Freq: Every day | ORAL | Status: DC

## 2023-07-25 MED ORDER — MIDAZOLAM HCL 2 MG/2ML IJ SOLN
INTRAMUSCULAR | Status: DC | PRN
  Administered 2023-07-25: 1 mg via INTRAVENOUS

## 2023-07-25 MED ORDER — TORSEMIDE 20 MG PO TABS: 20.0000 mg | ORAL_TABLET | Freq: Every day | ORAL | Status: DC

## 2023-07-25 MED ORDER — NITROGLYCERIN 1 MG/10 ML FOR IR/CATH LAB
INTRA_ARTERIAL | Status: AC
  Filled 2023-07-25: qty 10

## 2023-07-25 MED ORDER — ASPIRIN 81 MG PO CHEW: 81.0000 mg | CHEWABLE_TABLET | ORAL | Status: DC

## 2023-07-25 MED ORDER — LABETALOL HCL 5 MG/ML IV SOLN
10.0000 mg | INTRAVENOUS | Status: AC | PRN
Start: 2023-07-25 — End: 2023-07-25

## 2023-07-25 MED ORDER — HYDRALAZINE HCL 20 MG/ML IJ SOLN: 10.0000 mg | INTRAMUSCULAR | Status: AC | PRN

## 2023-07-25 MED ORDER — HEPARIN SODIUM (PORCINE) 1000 UNIT/ML IJ SOLN
INTRAMUSCULAR | Status: AC
Start: 2023-07-25 — End: ?
  Filled 2023-07-25: qty 10

## 2023-07-25 MED ORDER — LIDOCAINE HCL (PF) 1 % IJ SOLN
INTRAMUSCULAR | Status: DC | PRN
  Administered 2023-07-25: 20 mL

## 2023-07-25 MED ORDER — ASPIRIN 81 MG PO TBEC: 81.0000 mg | DELAYED_RELEASE_TABLET | Freq: Every day | ORAL | Status: DC

## 2023-07-25 MED ORDER — PANTOPRAZOLE SODIUM 40 MG PO TBEC: 40.0000 mg | DELAYED_RELEASE_TABLET | Freq: Two times a day (BID) | ORAL | Status: DC

## 2023-07-25 MED ORDER — SACUBITRIL-VALSARTAN 97-103 MG PO TABS: 1.0000 | ORAL_TABLET | Freq: Two times a day (BID) | ORAL | Status: DC

## 2023-07-25 MED ORDER — HEPARIN SODIUM (PORCINE) 1000 UNIT/ML IJ SOLN
INTRAMUSCULAR | Status: DC | PRN
  Administered 2023-07-25: 8000 [IU] via INTRAVENOUS
  Administered 2023-07-25: 3000 [IU] via INTRAVENOUS

## 2023-07-25 MED ORDER — CLOPIDOGREL BISULFATE 300 MG PO TABS
ORAL_TABLET | ORAL | Status: AC
  Filled 2023-07-25: qty 2

## 2023-07-25 MED ORDER — ONDANSETRON HCL 4 MG/2ML IJ SOLN: 4.0000 mg | Freq: Four times a day (QID) | INTRAMUSCULAR | Status: DC | PRN

## 2023-07-25 MED ORDER — PRAVASTATIN SODIUM 20 MG PO TABS: 20.0000 mg | ORAL_TABLET | Freq: Every day | ORAL | Status: DC

## 2023-07-25 MED ORDER — SODIUM CHLORIDE 0.9 % IV SOLN: INTRAVENOUS | Status: DC

## 2023-07-25 MED ORDER — BRIMONIDINE TARTRATE 0.2 % OP SOLN: 1.0000 [drp] | Freq: Two times a day (BID) | OPHTHALMIC | Status: DC

## 2023-07-25 MED ORDER — HYDRALAZINE HCL 50 MG PO TABS: 75.0000 mg | ORAL_TABLET | Freq: Three times a day (TID) | ORAL | Status: DC

## 2023-07-25 MED ORDER — LIDOCAINE HCL (PF) 1 % IJ SOLN
INTRAMUSCULAR | Status: AC
  Filled 2023-07-25: qty 30

## 2023-07-25 SURGICAL SUPPLY — 18 items
BALLN EMERGE MR 2.5X12 (BALLOONS) ×1
BALLOON EMERGE MR 2.5X12 (BALLOONS) IMPLANT
CATH INFINITI 5FR AL1 (CATHETERS) IMPLANT
CATH INFINITI 5FR MULTPACK ANG (CATHETERS) IMPLANT
CATH VISTA GUIDE 6FR AL1 (CATHETERS) IMPLANT
DEVICE SPIDERFX EMB PROT 3MM (WIRE) IMPLANT
KIT ENCORE 26 ADVANTAGE (KITS) IMPLANT
KIT MICROPUNCTURE NIT STIFF (SHEATH) IMPLANT
KIT SYRINGE INJ CVI SPIKEX1 (MISCELLANEOUS) IMPLANT
PACK CARDIAC CATHETERIZATION (CUSTOM PROCEDURE TRAY) ×1 IMPLANT
SET ATX-X65L (MISCELLANEOUS) IMPLANT
SHEATH PINNACLE 5F 10CM (SHEATH) IMPLANT
SHEATH PINNACLE 6F 10CM (SHEATH) IMPLANT
SHEATH PROBE COVER 6X72 (BAG) IMPLANT
STENT SYNERGY XD 2.75X16 (Permanent Stent) IMPLANT
SYNERGY XD 2.75X16 (Permanent Stent) ×1 IMPLANT
WIRE ASAHI PROWATER 180CM (WIRE) IMPLANT
WIRE EMERALD 3MM-J .035X150CM (WIRE) IMPLANT

## 2023-07-25 NOTE — Progress Notes (Signed)
Pt's sheath removed at 13:05.  Manual pressure held by myself for 30 mins. No obvious hematoma or other abnormalities noted.  Pt currently denies any needs or complaints. Site covered with gauze and TegaDerm dressing.  Pt remains hemodynamically stable.

## 2023-07-25 NOTE — Discharge Instructions (Signed)

## 2023-07-25 NOTE — Interval H&P Note (Signed)
History and Physical Interval Note:  07/25/2023 7:38 AM  Allan Japhet Guiden  has presented today for surgery, with the diagnosis of Coronary artery disease.  The various methods of treatment have been discussed with the patient and family. After consideration of risks, benefits and other options for treatment, the patient has consented to  Procedure(s): LEFT HEART CATH AND CORS/GRAFTS ANGIOGRAPHY (N/A) as a surgical intervention.  The patient's history has been reviewed, patient examined, no change in status, stable for surgery.  I have reviewed the patient's chart and labs.  Questions were answered to the patient's satisfaction.    Cath Lab Visit (complete for each Cath Lab visit)  Clinical Evaluation Leading to the Procedure:   ACS: No.  Non-ACS:    Anginal Classification: CCS III  Anti-ischemic medical therapy: Minimal Therapy (1 class of medications)  Non-Invasive Test Results: No non-invasive testing performed  Prior CABG: Previous CABG        Bradley Black

## 2023-07-25 NOTE — Discharge Summary (Signed)
Discharge Summary for Same Day PCI   Patient ID: Bradley Black MRN: [id number]; DOB: June 01, 1946  Admit date: 07/25/2023 Discharge date: 07/25/2023  Primary Care Provider: Donita Brooks, MD  Primary Cardiologist: Bradley Rich, MD  Primary Electrophysiologist:  Bradley Range, MD (Inactive)   Discharge Diagnoses    Principal Problem:   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 Active Problems:   Hyperlipidemia LDL goal <70  Diagnostic Studies/Procedures    Cardiac Catheterization 07/25/2023:    Prox RCA to Mid RCA lesion is 100% stenosed.   Prox Graft lesion is 99% stenosed.   Mid LAD lesion is 100% stenosed.   Origin to Prox Graft lesion is 100% stenosed.   Ost Cx to Mid Cx lesion is 99% stenosed.   2nd Mrg lesion is 100% stenosed.   A drug-eluting stent was successfully placed using a SYNERGY XD 2.75X16.   Post intervention, there is a 0% residual stenosis.   SVG graft was visualized by angiography.   LIMA graft was visualized by angiography and is normal in caliber.   SVG graft was visualized by angiography.   SVG graft was visualized by angiography and is normal in caliber.   Severe three vessel CAD s/p 4V CABG with 3/4 patent bypass grafts Chronic occlusion mid LAD. The mid and distal LAD fills from the patent LIMA graft. SVG to Diagonal is occluded Severe proximal Circumflex stenosis. Patent vein graft to the large obtuse marginal branch.  Large dominant RCA with chronic proximal occlusion within the old stent. Patent vein graft to the PDA. Severe stenosis proximal body of the vein graft Successful PTCA/DES x 1 proximal body of the vein graft to the PDA Elevated LV filling pressure   Recommendations: ASA and Plavix for at least six months. Same day post PCI discharge.   Diagnostic Dominance: Right  Intervention   _____________   History of Present Illness     Bradley Black is a 77 y.o. male with CAD status post CABG '13 (LIMA to LAD, SVG to  PDA, SVG to OM2, SVG to D1), chronic systolic heart failure, ESRD on PD who is followed by Dr. Dina Black as an outpatient.  He was seen in the office 10/11 for follow-up.  He was admitted earlier this month for a parotid gland resection and had postop chest pain.  Troponin was elevated and he was seen by cardiology while inpatient.  Recommendations were for outpatient cardiac catheterization once he had recovered from surgery.  LVEF of 40 to 45% on echo.  Of note he is not on a beta-blocker secondary to bradycardia, no SGLT2 in the setting of ESRD.  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  This was ultimately stopped secondary to abnormal thyroid studies.  At his follow-up on 10/11 left heart cath was discussed and arranged.  Hospital Course     The patient underwent cardiac cath as noted above with successful PCI/DES x 1 to proximal body of SVG to PDA. Plan for DAPT with ASA/plavix for at least 6 months. The patient was seen by cardiac rehab while in short stay. There were no observed complications post cath. Femoral cath site was re-evaluated prior to discharge and found to be stable without any complications. Instructions/precautions regarding cath site care were given prior to discharge. Has been on pravastatin PTA, switched to atorvastatin 40mg  daily at discharge.   Bradley Black was seen by  Discharge Summary for Same Day PCI   Patient ID: Bradley Black MRN: [id number]; DOB: June 01, 1946  Admit date: 07/25/2023 Discharge date: 07/25/2023  Primary Care Provider: Donita Brooks, MD  Primary Cardiologist: Bradley Rich, MD  Primary Electrophysiologist:  Bradley Range, MD (Inactive)   Discharge Diagnoses    Principal Problem:   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 Active Problems:   Hyperlipidemia LDL goal <70  Diagnostic Studies/Procedures    Cardiac Catheterization 07/25/2023:    Prox RCA to Mid RCA lesion is 100% stenosed.   Prox Graft lesion is 99% stenosed.   Mid LAD lesion is 100% stenosed.   Origin to Prox Graft lesion is 100% stenosed.   Ost Cx to Mid Cx lesion is 99% stenosed.   2nd Mrg lesion is 100% stenosed.   A drug-eluting stent was successfully placed using a SYNERGY XD 2.75X16.   Post intervention, there is a 0% residual stenosis.   SVG graft was visualized by angiography.   LIMA graft was visualized by angiography and is normal in caliber.   SVG graft was visualized by angiography.   SVG graft was visualized by angiography and is normal in caliber.   Severe three vessel CAD s/p 4V CABG with 3/4 patent bypass grafts Chronic occlusion mid LAD. The mid and distal LAD fills from the patent LIMA graft. SVG to Diagonal is occluded Severe proximal Circumflex stenosis. Patent vein graft to the large obtuse marginal branch.  Large dominant RCA with chronic proximal occlusion within the old stent. Patent vein graft to the PDA. Severe stenosis proximal body of the vein graft Successful PTCA/DES x 1 proximal body of the vein graft to the PDA Elevated LV filling pressure   Recommendations: ASA and Plavix for at least six months. Same day post PCI discharge.   Diagnostic Dominance: Right  Intervention   _____________   History of Present Illness     Bradley Black is a 77 y.o. male with CAD status post CABG '13 (LIMA to LAD, SVG to  PDA, SVG to OM2, SVG to D1), chronic systolic heart failure, ESRD on PD who is followed by Dr. Dina Black as an outpatient.  He was seen in the office 10/11 for follow-up.  He was admitted earlier this month for a parotid gland resection and had postop chest pain.  Troponin was elevated and he was seen by cardiology while inpatient.  Recommendations were for outpatient cardiac catheterization once he had recovered from surgery.  LVEF of 40 to 45% on echo.  Of note he is not on a beta-blocker secondary to bradycardia, no SGLT2 in the setting of ESRD.  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  This was ultimately stopped secondary to abnormal thyroid studies.  At his follow-up on 10/11 left heart cath was discussed and arranged.  Hospital Course     The patient underwent cardiac cath as noted above with successful PCI/DES x 1 to proximal body of SVG to PDA. Plan for DAPT with ASA/plavix for at least 6 months. The patient was seen by cardiac rehab while in short stay. There were no observed complications post cath. Femoral cath site was re-evaluated prior to discharge and found to be stable without any complications. Instructions/precautions regarding cath site care were given prior to discharge. Has been on pravastatin PTA, switched to atorvastatin 40mg  daily at discharge.   Bradley Black was seen by  135.6 lb Date of Birth:  03-05-1946       BSA:          1.733 m Patient Age:    27 years        BP:           143/80 mmHg Patient Gender: M               HR:           78 bpm. Exam Location:  Inpatient Procedure: 2D Echo, Color Doppler and Cardiac Doppler Indications:    chest pain  History:        Patient has prior history of Echocardiogram examinations, most                 recent 11/17/2020. Cardiomyopathy, Prior CABG, end stage renal                 disease; Risk Factors:Diabetes and Hypertension.  Sonographer:    Delcie Roch RDCS Referring Phys: [id number] PETER C NISHAN IMPRESSIONS  1. Septal , apical , inferior wall hypokinesis . Left ventricular ejection fraction, by estimation, is 40 to 45%. The left ventricle has mildly decreased function. The left ventricle has no regional wall motion abnormalities. The left ventricular internal cavity size was mildly dilated. There is mild left ventricular hypertrophy.  Left ventricular diastolic parameters were normal.  2. Right ventricular systolic function is normal. The right ventricular size is normal. There is mildly elevated pulmonary artery systolic pressure.  3. Left atrial size was severely dilated.  4. Right atrial size was mildly dilated.  5. The mitral valve is abnormal. Mild to moderate mitral valve regurgitation. No evidence of mitral stenosis.  6. Tricuspid valve regurgitation is mild to moderate.  7. The aortic valve is tricuspid. Aortic valve regurgitation is not visualized. No aortic stenosis is present.  8. The inferior vena cava is normal in size with greater than 50% respiratory variability, suggesting right atrial pressure of 3 mmHg. FINDINGS  Left Ventricle: Septal , apical , inferior wall hypokinesis. Left ventricular ejection fraction, by estimation, is 40 to 45%. The left ventricle has mildly decreased function. The left ventricle has no regional wall motion abnormalities. The left ventricular internal cavity size was mildly dilated. There is mild left ventricular hypertrophy. Left ventricular diastolic parameters were normal. Right Ventricle: The right ventricular size is normal. No increase in right ventricular wall thickness. Right ventricular systolic function is normal. There is mildly elevated pulmonary artery systolic pressure. The tricuspid regurgitant velocity is 3.22  m/s, and with an assumed right atrial pressure of 3 mmHg, the estimated right ventricular systolic pressure is 44.5 mmHg. Left Atrium: Left atrial size was severely dilated. Right Atrium: Right atrial size was mildly dilated. Pericardium: There is no evidence of pericardial effusion. Mitral Valve: The mitral valve is abnormal. There is mild thickening of the mitral valve leaflet(s). Mild to moderate mitral valve regurgitation. No evidence of mitral valve stenosis. Tricuspid Valve: The tricuspid valve is normal in structure. Tricuspid valve regurgitation is mild to moderate. No  evidence of tricuspid stenosis. Aortic Valve: The aortic valve is tricuspid. Aortic valve regurgitation is not visualized. No aortic stenosis is present. Pulmonic Valve: The pulmonic valve was normal in structure. Pulmonic valve regurgitation is mild. No evidence of pulmonic stenosis. Aorta: The aortic root is normal in size and structure. Venous: The inferior vena cava is normal in size with greater than 50% respiratory variability, suggesting right atrial pressure of 3 mmHg. IAS/Shunts: No  135.6 lb Date of Birth:  03-05-1946       BSA:          1.733 m Patient Age:    27 years        BP:           143/80 mmHg Patient Gender: M               HR:           78 bpm. Exam Location:  Inpatient Procedure: 2D Echo, Color Doppler and Cardiac Doppler Indications:    chest pain  History:        Patient has prior history of Echocardiogram examinations, most                 recent 11/17/2020. Cardiomyopathy, Prior CABG, end stage renal                 disease; Risk Factors:Diabetes and Hypertension.  Sonographer:    Delcie Roch RDCS Referring Phys: [id number] PETER C NISHAN IMPRESSIONS  1. Septal , apical , inferior wall hypokinesis . Left ventricular ejection fraction, by estimation, is 40 to 45%. The left ventricle has mildly decreased function. The left ventricle has no regional wall motion abnormalities. The left ventricular internal cavity size was mildly dilated. There is mild left ventricular hypertrophy.  Left ventricular diastolic parameters were normal.  2. Right ventricular systolic function is normal. The right ventricular size is normal. There is mildly elevated pulmonary artery systolic pressure.  3. Left atrial size was severely dilated.  4. Right atrial size was mildly dilated.  5. The mitral valve is abnormal. Mild to moderate mitral valve regurgitation. No evidence of mitral stenosis.  6. Tricuspid valve regurgitation is mild to moderate.  7. The aortic valve is tricuspid. Aortic valve regurgitation is not visualized. No aortic stenosis is present.  8. The inferior vena cava is normal in size with greater than 50% respiratory variability, suggesting right atrial pressure of 3 mmHg. FINDINGS  Left Ventricle: Septal , apical , inferior wall hypokinesis. Left ventricular ejection fraction, by estimation, is 40 to 45%. The left ventricle has mildly decreased function. The left ventricle has no regional wall motion abnormalities. The left ventricular internal cavity size was mildly dilated. There is mild left ventricular hypertrophy. Left ventricular diastolic parameters were normal. Right Ventricle: The right ventricular size is normal. No increase in right ventricular wall thickness. Right ventricular systolic function is normal. There is mildly elevated pulmonary artery systolic pressure. The tricuspid regurgitant velocity is 3.22  m/s, and with an assumed right atrial pressure of 3 mmHg, the estimated right ventricular systolic pressure is 44.5 mmHg. Left Atrium: Left atrial size was severely dilated. Right Atrium: Right atrial size was mildly dilated. Pericardium: There is no evidence of pericardial effusion. Mitral Valve: The mitral valve is abnormal. There is mild thickening of the mitral valve leaflet(s). Mild to moderate mitral valve regurgitation. No evidence of mitral valve stenosis. Tricuspid Valve: The tricuspid valve is normal in structure. Tricuspid valve regurgitation is mild to moderate. No  evidence of tricuspid stenosis. Aortic Valve: The aortic valve is tricuspid. Aortic valve regurgitation is not visualized. No aortic stenosis is present. Pulmonic Valve: The pulmonic valve was normal in structure. Pulmonic valve regurgitation is mild. No evidence of pulmonic stenosis. Aorta: The aortic root is normal in size and structure. Venous: The inferior vena cava is normal in size with greater than 50% respiratory variability, suggesting right atrial pressure of 3 mmHg. IAS/Shunts: No  135.6 lb Date of Birth:  03-05-1946       BSA:          1.733 m Patient Age:    27 years        BP:           143/80 mmHg Patient Gender: M               HR:           78 bpm. Exam Location:  Inpatient Procedure: 2D Echo, Color Doppler and Cardiac Doppler Indications:    chest pain  History:        Patient has prior history of Echocardiogram examinations, most                 recent 11/17/2020. Cardiomyopathy, Prior CABG, end stage renal                 disease; Risk Factors:Diabetes and Hypertension.  Sonographer:    Delcie Roch RDCS Referring Phys: [id number] PETER C NISHAN IMPRESSIONS  1. Septal , apical , inferior wall hypokinesis . Left ventricular ejection fraction, by estimation, is 40 to 45%. The left ventricle has mildly decreased function. The left ventricle has no regional wall motion abnormalities. The left ventricular internal cavity size was mildly dilated. There is mild left ventricular hypertrophy.  Left ventricular diastolic parameters were normal.  2. Right ventricular systolic function is normal. The right ventricular size is normal. There is mildly elevated pulmonary artery systolic pressure.  3. Left atrial size was severely dilated.  4. Right atrial size was mildly dilated.  5. The mitral valve is abnormal. Mild to moderate mitral valve regurgitation. No evidence of mitral stenosis.  6. Tricuspid valve regurgitation is mild to moderate.  7. The aortic valve is tricuspid. Aortic valve regurgitation is not visualized. No aortic stenosis is present.  8. The inferior vena cava is normal in size with greater than 50% respiratory variability, suggesting right atrial pressure of 3 mmHg. FINDINGS  Left Ventricle: Septal , apical , inferior wall hypokinesis. Left ventricular ejection fraction, by estimation, is 40 to 45%. The left ventricle has mildly decreased function. The left ventricle has no regional wall motion abnormalities. The left ventricular internal cavity size was mildly dilated. There is mild left ventricular hypertrophy. Left ventricular diastolic parameters were normal. Right Ventricle: The right ventricular size is normal. No increase in right ventricular wall thickness. Right ventricular systolic function is normal. There is mildly elevated pulmonary artery systolic pressure. The tricuspid regurgitant velocity is 3.22  m/s, and with an assumed right atrial pressure of 3 mmHg, the estimated right ventricular systolic pressure is 44.5 mmHg. Left Atrium: Left atrial size was severely dilated. Right Atrium: Right atrial size was mildly dilated. Pericardium: There is no evidence of pericardial effusion. Mitral Valve: The mitral valve is abnormal. There is mild thickening of the mitral valve leaflet(s). Mild to moderate mitral valve regurgitation. No evidence of mitral valve stenosis. Tricuspid Valve: The tricuspid valve is normal in structure. Tricuspid valve regurgitation is mild to moderate. No  evidence of tricuspid stenosis. Aortic Valve: The aortic valve is tricuspid. Aortic valve regurgitation is not visualized. No aortic stenosis is present. Pulmonic Valve: The pulmonic valve was normal in structure. Pulmonic valve regurgitation is mild. No evidence of pulmonic stenosis. Aorta: The aortic root is normal in size and structure. Venous: The inferior vena cava is normal in size with greater than 50% respiratory variability, suggesting right atrial pressure of 3 mmHg. IAS/Shunts: No  135.6 lb Date of Birth:  03-05-1946       BSA:          1.733 m Patient Age:    27 years        BP:           143/80 mmHg Patient Gender: M               HR:           78 bpm. Exam Location:  Inpatient Procedure: 2D Echo, Color Doppler and Cardiac Doppler Indications:    chest pain  History:        Patient has prior history of Echocardiogram examinations, most                 recent 11/17/2020. Cardiomyopathy, Prior CABG, end stage renal                 disease; Risk Factors:Diabetes and Hypertension.  Sonographer:    Delcie Roch RDCS Referring Phys: [id number] PETER C NISHAN IMPRESSIONS  1. Septal , apical , inferior wall hypokinesis . Left ventricular ejection fraction, by estimation, is 40 to 45%. The left ventricle has mildly decreased function. The left ventricle has no regional wall motion abnormalities. The left ventricular internal cavity size was mildly dilated. There is mild left ventricular hypertrophy.  Left ventricular diastolic parameters were normal.  2. Right ventricular systolic function is normal. The right ventricular size is normal. There is mildly elevated pulmonary artery systolic pressure.  3. Left atrial size was severely dilated.  4. Right atrial size was mildly dilated.  5. The mitral valve is abnormal. Mild to moderate mitral valve regurgitation. No evidence of mitral stenosis.  6. Tricuspid valve regurgitation is mild to moderate.  7. The aortic valve is tricuspid. Aortic valve regurgitation is not visualized. No aortic stenosis is present.  8. The inferior vena cava is normal in size with greater than 50% respiratory variability, suggesting right atrial pressure of 3 mmHg. FINDINGS  Left Ventricle: Septal , apical , inferior wall hypokinesis. Left ventricular ejection fraction, by estimation, is 40 to 45%. The left ventricle has mildly decreased function. The left ventricle has no regional wall motion abnormalities. The left ventricular internal cavity size was mildly dilated. There is mild left ventricular hypertrophy. Left ventricular diastolic parameters were normal. Right Ventricle: The right ventricular size is normal. No increase in right ventricular wall thickness. Right ventricular systolic function is normal. There is mildly elevated pulmonary artery systolic pressure. The tricuspid regurgitant velocity is 3.22  m/s, and with an assumed right atrial pressure of 3 mmHg, the estimated right ventricular systolic pressure is 44.5 mmHg. Left Atrium: Left atrial size was severely dilated. Right Atrium: Right atrial size was mildly dilated. Pericardium: There is no evidence of pericardial effusion. Mitral Valve: The mitral valve is abnormal. There is mild thickening of the mitral valve leaflet(s). Mild to moderate mitral valve regurgitation. No evidence of mitral valve stenosis. Tricuspid Valve: The tricuspid valve is normal in structure. Tricuspid valve regurgitation is mild to moderate. No  evidence of tricuspid stenosis. Aortic Valve: The aortic valve is tricuspid. Aortic valve regurgitation is not visualized. No aortic stenosis is present. Pulmonic Valve: The pulmonic valve was normal in structure. Pulmonic valve regurgitation is mild. No evidence of pulmonic stenosis. Aorta: The aortic root is normal in size and structure. Venous: The inferior vena cava is normal in size with greater than 50% respiratory variability, suggesting right atrial pressure of 3 mmHg. IAS/Shunts: No

## 2023-07-25 NOTE — Progress Notes (Signed)
Pt was educated on stent card, stent location, Antiplatelet and ASA use, wt restrictions, no baths/daily wash-ups, s/s of infection, ex guidelines, s/s to stop exercising, NTG use and calling 911, heart healthy diet, risk factors and CRPII. Pt received materials on exercise, diet, and CRPII. Will refer to AP.   Faustino Congress 07/25/2023 2:36 PM   [id number]

## 2023-07-25 NOTE — Plan of Care (Signed)
[facility] Tonsillectomy/Adenoidectomy, Postoperative PEDS care plan entered in error.

## 2023-07-26 ENCOUNTER — Encounter (HOSPITAL_COMMUNITY): Payer: Self-pay | Admitting: Cardiovascular Disease

## 2023-07-26 DIAGNOSIS — N186 End stage renal disease: Secondary | ICD-10-CM | POA: Diagnosis not present

## 2023-07-26 DIAGNOSIS — Z23 Encounter for immunization: Secondary | ICD-10-CM | POA: Diagnosis not present

## 2023-07-26 DIAGNOSIS — Z992 Dependence on renal dialysis: Secondary | ICD-10-CM | POA: Diagnosis not present

## 2023-07-27 DIAGNOSIS — N186 End stage renal disease: Secondary | ICD-10-CM | POA: Diagnosis not present

## 2023-07-27 DIAGNOSIS — Z992 Dependence on renal dialysis: Secondary | ICD-10-CM | POA: Diagnosis not present

## 2023-07-27 DIAGNOSIS — Z23 Encounter for immunization: Secondary | ICD-10-CM | POA: Diagnosis not present

## 2023-07-28 DIAGNOSIS — Z992 Dependence on renal dialysis: Secondary | ICD-10-CM | POA: Diagnosis not present

## 2023-07-28 DIAGNOSIS — N186 End stage renal disease: Secondary | ICD-10-CM | POA: Diagnosis not present

## 2023-07-28 DIAGNOSIS — Z23 Encounter for immunization: Secondary | ICD-10-CM | POA: Diagnosis not present

## 2023-07-29 DIAGNOSIS — Z23 Encounter for immunization: Secondary | ICD-10-CM | POA: Diagnosis not present

## 2023-07-29 DIAGNOSIS — Z992 Dependence on renal dialysis: Secondary | ICD-10-CM | POA: Diagnosis not present

## 2023-07-29 DIAGNOSIS — N186 End stage renal disease: Secondary | ICD-10-CM | POA: Diagnosis not present

## 2023-07-29 MED FILL — Verapamil HCl IV Soln 2.5 MG/ML: INTRAVENOUS | Qty: 2 | Status: AC

## 2023-07-29 NOTE — Progress Notes (Signed)
Oncology Nurse Navigator Documentation   Placed introductory call to new referral patient Bradley Black. I spoke with [name].  Introduced myself as the H&N oncology nurse navigator that works with Dr. Basilio Cairo to whom he has been referred by Dr. Ernestene Kiel. She confirmed understanding of referral. Briefly explained my role as his navigator, provided my contact information.  Confirmed understanding of upcoming appts and [facility] location, explained arrival and registration process. I will speak with him tomorrow before his consult with Dr. Basilio Cairo about Scatter Protective devices. At the time of my call his wife was overwhelmed with his upcoming appointments including ENT, Cardiology, and Radiation.  I encouraged them to call with questions/concerns as he moves forward with appts and procedures.   She verbalized understanding of information provided, expressed appreciation for my call.   Navigator Initial Assessment Employment Status: he is retired Currently on [employer] / STD: na Living Situation: he lives with his wife Support System: wife, family PCP: Lynnea Ferrier MD PCD: unknown Financial Concerns: not at this time Transportation Needs: no Sensory Deficits:no Language Barriers/Interpreter Needed:  no Ambulation Needs: no Psychosocial Needs:  no Concerns/Needs Understanding Cancer:  addressed/answered by navigator to best of ability Self-Expressed Needs: no   Tourist information centre manager, BSN, OCN Head & Neck Oncology Nurse Navigator [facility] at [facility] Phone # [phone]  Fax # [phone]

## 2023-07-30 ENCOUNTER — Encounter: Payer: Self-pay | Admitting: Cardiology

## 2023-07-30 ENCOUNTER — Ambulatory Visit: Payer: Medicare Other

## 2023-07-30 ENCOUNTER — Ambulatory Visit: Payer: Medicare Other | Attending: Cardiology | Admitting: Cardiology

## 2023-07-30 ENCOUNTER — Ambulatory Visit
Admission: RE | Admit: 2023-07-30 | Discharge: 2023-07-30 | Disposition: A | Discharge status: Home / Self Care | Payer: Medicare Other | Source: Ambulatory Visit | Attending: Radiation Oncology | Admitting: Radiation Oncology

## 2023-07-30 ENCOUNTER — Telehealth: Payer: Self-pay

## 2023-07-30 VITALS — BP 124/60 | HR 67 | Ht 68.0 in | Wt 141.6 lb

## 2023-07-30 DIAGNOSIS — N186 End stage renal disease: Secondary | ICD-10-CM | POA: Diagnosis not present

## 2023-07-30 DIAGNOSIS — R42 Dizziness and giddiness: Secondary | ICD-10-CM

## 2023-07-30 DIAGNOSIS — I2510 Atherosclerotic heart disease of native coronary artery without angina pectoris: Secondary | ICD-10-CM | POA: Diagnosis not present

## 2023-07-30 DIAGNOSIS — C07 Malignant neoplasm of parotid gland: Secondary | ICD-10-CM

## 2023-07-30 DIAGNOSIS — Z23 Encounter for immunization: Secondary | ICD-10-CM | POA: Diagnosis not present

## 2023-07-30 DIAGNOSIS — Z992 Dependence on renal dialysis: Secondary | ICD-10-CM | POA: Diagnosis not present

## 2023-07-30 NOTE — Telephone Encounter (Signed)
Rn reached to pt wife after she called admin team to let them know that they were running late. When RN spoke to pt wife she sounded very concerned and report they were at the Cardiology office. She stated he was continuing to have heart concerns after a recent heart cath. She stated they were running test on him to see what was going on. She felt like today was too hectic and would like to reschedule this appointment. Rn will reach out to the admin team to assist with this.

## 2023-07-30 NOTE — Progress Notes (Signed)
Clinical Summary Bradley Black is a 77 y.o.male seen today for follow up of the following medical problems.   Focused visit on recent PCI and also recent symptoms of dizziness.   1. CAD - multiple prior interventions with subsequent CABG in 11/2011 with LIMA-LAD, SVG-PDA, SVG-OM2, and SVG-D1     - admit 07/2023 for parotid gland resection, surgery was on 10/4. Had postop chest pain.  - elevated troponin, seen by cards inpatient. Plans were for outpatient cath once recovered from surgery 07/2023 echo: LVEF 40-45%,     - 07/25/2023 cath: mid LAD 100% CTO, ostial LCX 99%, OM2 100% CTO, RCA CTO, SVG-RPDA prox graft lesion 99%, LIMA-LAD normal, SVG-D1 CTO, SVG-OM2 normal - DES to SVG-RPDA - LVEDP 21    2. Dizziness - episodes of feeling hot all over body, dizziness. Can occur at or with activity. No associated symptoms. Lasts a few minutes.  - 2 episodes yesterday, 3 times this morning. Usually occurs when standing. - 1 cup coffee in AM, small amount soda  - down 8 lbs since 05/2023. Decreased itnake with his parotid tumor and recent surgery, has limited his fluid intake as well with his ESRD.  - orthostatic today SBP drops 30 with standing    Other medical problems not addressed this visit    2. Chronic combine systolic/diastolic HF -10/2018 echo: LVEF 45% - 07/2020 echo LVEF 25% - 11/2020 echo LVEF 35-40%, grade I dd, mild RV dysfunction.   - not on beta blocker due to bradycardia - no SGLT2i since in setting of ESRD  - 07/2023 echo: LVEF 40-45%,      3. PVCs Recent monitor last year showed an 18% PVC burden and he was ultimately started on Amiodarone 200mg  daily -no recent palpitations - from notes from [place] [facility] Hews amio stopped due to abnormal thyroid studies     4. Carotid stenosis 05/2020 RICA mild, left moderate disease 08/2022 RICA 1-39%, LICA 40-59%   5. 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 HLD - pravastatin changed to atorvastatin after 07/2023 cath     [facility]: enjoys working with  bees Past Medical History:  Diagnosis Date   BPH (benign prostatic hyperplasia)    CAD S/P percutaneous coronary angioplasty    a. PTCA of OM 1988 & 1994 b. PCI with BMS to LAD in 1997 c. RCA PCI BMS 1999 & 2000 d. s/p CABG in 11/2011 with LIMA-LAD, SVG-PDA, SVG-OM2, and SVG-D1   Cataract    Chronic back pain    CKD (chronic kidney disease), stage III (HCC)    Cyst of bursa    R shoulder   Diabetic retinopathy (HCC)    DM (diabetes mellitus), type 2 with renal complications (HCC)    Essential hypertension    Frequent PVCs    GERD (gastroesophageal reflux disease)    Hypertensive retinopathy    Ischemic cardiomyopathy 11/2011   Intra-OP TEE: EF 40-45%, no regional WMA; improved Anterior WM post CABG.   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     atorvastatin (LIPITOR) 40 MG tablet Take 1 tablet (40 mg total) by mouth daily. 90 tablet 3   brimonidine (ALPHAGAN) 0.2 % ophthalmic solution Place 1 drop into both eyes 2 (two) times daily.     Cholecalciferol (VITAMIN D3) 25 MCG (1000 UT) CAPS Take 1 capsule (1,000 Units total) by mouth daily at 6 (six) AM. 60 capsule 6   clopidogrel (PLAVIX) 75 MG tablet Take 1 tablet (75 mg total) by mouth daily. 90 tablet 1   cyclobenzaprine (FLEXERIL) 5 MG tablet Take 1 tablet (5 mg total) by mouth 3 (three) times daily as needed for muscle spasms. 30 tablet 1   doxazosin (CARDURA) 1 MG tablet Take 2 mg by mouth daily.     ferrous sulfate 325 (65 FE) MG tablet Take 325 mg by mouth daily with breakfast.     hydrALAZINE  (APRESOLINE) 50 MG tablet TAKE ONE AND ONE-HALF TABLETS (75MG  TOTAL) BY MOUTH THREE TIMES DAILY 270 tablet 1   isosorbide mononitrate (IMDUR) 30 MG 24 hr tablet Take 1 tablet (30 mg total) by mouth daily. 30 tablet 1   ketorolac (ACULAR) 0.5 % ophthalmic solution Place 1 drop into both eyes in the morning, at noon, and at bedtime.     levothyroxine (SYNTHROID) 50 MCG tablet TAKE ONE TABLET ( TOTAL) BY MOUTH DAILY BEFORE BREAKFAST 90 tablet 1   multivitamin (RENA-VIT) TABS tablet Take 1 tablet by mouth daily.     pantoprazole (PROTONIX) 40 MG tablet TAKE ONE TABLET BY MOUTH TWICE A DAY 180 tablet 0   sacubitril-valsartan (ENTRESTO) 97-103 MG TAKE ONE TABLET BY MOUTH TWICE A DAY 60 tablet 3   sildenafil (VIAGRA) 100 MG tablet Take 0.5-1 tablets (50-100 mg total) by mouth daily as needed for erectile dysfunction. 60 tablet 1   spironolactone (ALDACTONE) 25 MG tablet Take 0.5 tablets (12.5 mg total) by mouth daily. 15 tablet 6   torsemide (DEMADEX) 20 MG tablet Take 20 mg by mouth daily.     No current facility-administered medications for this visit.     Past Surgical History:  Procedure Laterality Date   AV FISTULA PLACEMENT Left 07/28/2020   Procedure: LEFT ARM ARTERIOVENOUS (AV) FISTULA  CREATION;  Surgeon: Larina Earthly, MD;  Location: [facility];  Service: Vascular;  Laterality: Left;   BACK SURGERY  10/01/1968   BIOPSY  01/25/2022   Procedure: BIOPSY;  Surgeon: Corbin Ade, MD;  Location: [facility];  Service: Endoscopy;;   CARDIAC CATHETERIZATION  10/02/2011   CATARACT EXTRACTION W/PHACO Left 09/14/2022   Procedure: CATARACT EXTRACTION PHACO AND INTRAOCULAR LENS PLACEMENT (IOC);  Surgeon: Fabio Pierce, MD;  Location: [facility];  Service: Ophthalmology;  Laterality: Left;  CDE: 8.66   CATARACT EXTRACTION W/PHACO Right 09/28/2022   Procedure: CATARACT EXTRACTION PHACO AND INTRAOCULAR LENS PLACEMENT (IOC);  Surgeon: Fabio Pierce, MD;  Location: [facility];  Service: Ophthalmology;   Laterality: Right;  CDE 7.79   COLONOSCOPY WITH PROPOFOL N/A 01/25/2022   Procedure: COLONOSCOPY WITH PROPOFOL;  Surgeon: Corbin Ade, MD;  Location: [facility];  Service: Endoscopy;  Laterality: N/A;  10:30am   CORONARY ANGIOPLASTY  10/01/1992   OM   CORONARY ANGIOPLASTY  10/02/1995   LAD   CORONARY ANGIOPLASTY WITH STENT PLACEMENT  10/01/1997   RCA   CORONARY ANGIOPLASTY WITH STENT PLACEMENT  10/02/1999   RCA   CORONARY ARTERY BYPASS GRAFT  12/27/2011   Procedure: CORONARY ARTERY BYPASS  GRAFTING (CABG);  Surgeon: Purcell Nails, MD;  Location: [facility] OR;  Service: Open Heart Surgery;  Laterality: N/A;  Times four. On pump. Using endoscopically harvested right greater saphenous vein and left internal mammary artery.    CORONARY STENT INTERVENTION N/A 07/25/2023   Procedure: CORONARY STENT INTERVENTION;  Surgeon: Kathleene Hazel, MD;  Location: [facility];  Service: Cardiovascular;  Laterality: N/A;   ESOPHAGOGASTRODUODENOSCOPY (EGD) WITH PROPOFOL N/A 01/25/2022   Procedure: ESOPHAGOGASTRODUODENOSCOPY (EGD) WITH PROPOFOL;  Surgeon: Corbin Ade, MD;  Location: [facility];  Service: Endoscopy;  Laterality: N/A;   HERNIA REPAIR Bilateral    INCISION AND DRAINAGE OF WOUND  10/01/2004   axilla   INSERTION OF DIALYSIS CATHETER Right 07/25/2020   Procedure: INSERTION OF DIALYSIS CATHETER;  Surgeon: Lucretia Roers, MD;  Location: [facility];  Service: General;  Laterality: Right;   INTRAOPERATIVE TRANSESOPHAGEAL ECHOCARDIOGRAM  12/27/2011   Global hypokinesis with EF of 40-45%, improved LAD distribution wall motion.   LEFT HEART CATH AND CORS/GRAFTS ANGIOGRAPHY N/A 07/25/2023   Procedure: LEFT HEART CATH AND CORS/GRAFTS ANGIOGRAPHY;  Surgeon: Kathleene Hazel, MD;  Location: [facility];  Service: Cardiovascular;  Laterality: N/A;   LEFT HEART CATHETERIZATION WITH CORONARY ANGIOGRAM N/A 12/13/2011   Procedure: LEFT HEART CATHETERIZATION WITH CORONARY  ANGIOGRAM;  Surgeon: Marykay Lex, MD;  Location[facility] CATH LAB;  Service: Cardiovascular;  Laterality: N/A;   LUMBAR FUSION     MASS EXCISION  10/24/2011   R arm   PAROTIDECTOMY Right 07/05/2023   Procedure: 1. Right radical parotidectomy with wide local excision facial skin 3x3cm, dissection and preservation of facial nerve (CPT 42415, 11644);  Surgeon: Scarlette Ar, MD;  Location: [facility] OR;  Service: ENT;  Laterality: Right;   POLYPECTOMY  01/25/2022   Procedure: POLYPECTOMY;  Surgeon: Corbin Ade, MD;  [facility];  Service: Endoscopy;;   RADICAL NECK DISSECTION Right 07/05/2023   Procedure: 2. Right modified radical neck dissection 1-3 (CPT [phone]) 3. Cervicofacial rotation advancement flap 10x10cm for closure of 3x3cm defect (CPT 14040);  Surgeon: Scarlette Ar, MD;  Location: [facility] OR;  Service: ENT;  Laterality: Right;   RIGHT HEART CATH N/A 07/12/2020   Procedure: RIGHT HEART CATH;  Surgeon: Lyn Records, MD;  Location: [facility] INVASIVE CV LAB;  Service: Cardiovascular;  Laterality: N/A;   TRANSESOPHAGEAL ECHOCARDIOGRAM  10/02/2011     No Known Allergies    Family History  Problem Relation Age of Onset   Cancer Mother        breast   Cancer Father        bone   Gawain cancer Neg Hx      Social History Mr. Samborski reports that he quit smoking about 36 years ago. His smoking use included cigarettes. He started smoking about 51 years ago. He has a 15 pack-year smoking history. He has never used smokeless tobacco. Mr. Ninneman reports no history of alcohol use.   Review of Systems CONSTITUTIONAL: No weight loss, fever, chills, weakness or fatigue.  HEENT: Eyes: No visual loss, blurred vision, double vision or yellow sclerae.No hearing loss, sneezing, congestion, runny nose or sore throat.  SKIN: No rash or itching.  CARDIOVASCULAR: per hpi RESPIRATORY: No shortness of breath, cough or sputum.  GASTROINTESTINAL: No anorexia, nausea, vomiting or diarrhea. No abdominal  pain or blood.  GENITOURINARY: No burning on urination, no polyuria NEUROLOGICAL: No headache, dizziness, syncope, paralysis, ataxia, numbness or tingling in the extremities. No change  in bowel or bladder control.  MUSCULOSKELETAL: No muscle, back pain, joint pain or stiffness.  LYMPHATICS: No enlarged nodes. No history of splenectomy.  PSYCHIATRIC: No history of depression or anxiety.  ENDOCRINOLOGIC: No reports of sweating, cold or heat intolerance. No polyuria or polydipsia.  [name]   Physical Examination Today's Vitals   07/30/23 1115  BP: 124/60  Pulse: 67  SpO2: 99%  Weight: 141 lb 9.6 oz (64.2 kg)  Height: 5\' 8"  (1.727 m)   Body mass index is 21.53 kg/m.  Gen: resting comfortably, no acute distress HEENT: no scleral icterus, pupils equal round and reactive, no palptable cervical adenopathy,  CV: RRR, no m/rg, no jvd Resp: Clear to auscultation bilaterally GI: abdomen is soft, non-tender, non-distended, normal bowel sounds, no hepatosplenomegaly MSK: extremities are warm, no edema.  Skin: warm, no rash Neuro:  no focal deficits Psych: appropriate affect   Diagnostic Studies  07/2023 echo 1. Septal , apical , inferior wall hypokinesis . Left ventricular  ejection fraction, by estimation, is 40 to 45%. The left ventricle has  mildly decreased function. The left ventricle has no regional wall motion  abnormalities. The left ventricular  internal cavity size was mildly dilated. There is mild left ventricular  hypertrophy. Left ventricular diastolic parameters were normal.   2. Right ventricular systolic function is normal. The right ventricular  size is normal. There is mildly elevated pulmonary artery systolic  pressure.   3. Left atrial size was severely dilated.   4. Right atrial size was mildly dilated.   5. The mitral valve is abnormal. Mild to moderate mitral valve  regurgitation. No evidence of mitral stenosis.   6. Tricuspid valve regurgitation is mild to  moderate.   7. The aortic valve is tricuspid. Aortic valve regurgitation is not  visualized. No aortic stenosis is present.   8. The inferior vena cava is normal in size with greater than 50%  respiratory variability, suggesting right atrial pressure of 3 mmHg.    07/25/23 cath Prox RCA to Mid RCA lesion is 100% stenosed.   Prox Graft lesion is 99% stenosed.   Mid LAD lesion is 100% stenosed.   Origin to Prox Graft lesion is 100% stenosed.   Ost Cx to Mid Cx lesion is 99% stenosed.   2nd Mrg lesion is 100% stenosed.   A drug-eluting stent was successfully placed using a SYNERGY XD [id number].   Post intervention, there is a 0% residual stenosis.   SVG graft was visualized by angiography.   LIMA graft was visualized by angiography and is normal in caliber.   SVG graft was visualized by angiography.   SVG graft was visualized by angiography and is normal in caliber.   Severe three vessel CAD s/p 4V CABG with 3/4 patent bypass grafts Chronic occlusion mid LAD. The mid and distal LAD fills from the patent LIMA graft. SVG to Diagonal is occluded Severe proximal Circumflex stenosis. Patent vein graft to the large obtuse marginal Lennis Rader.  Large dominant RCA with chronic proximal occlusion within the old stent. Patent vein graft to the PDA. Severe stenosis proximal body of the vein graft Successful PTCA/DES x 1 proximal body of the vein graft to the PDA Elevated LV filling pressure   Recommendations: ASA and Plavix for at least six months. Same day post PCI discharge.   Assessment and Plan   1. CAD/postoperative NSTEMI -long history of CAD as reported above with CABG in 2013 - recent parotid gland surgery, s/p surgery chest pains and elevated  troponins - cath postponsed in postop period with plans at discharge for outpatient cath - subsequent outpatient cath with PCI as reported above - continue current meds   2. Dizziness/weakness - symptoms only with standing - severely orthostatic  today, SBP drops 30 points with standing. Poor oral hydration, encouraged increasing oral hydration and monitoring symptoms. If ongoing despite hydration can cut back meds   Antoine Poche, M.D.

## 2023-07-30 NOTE — Patient Instructions (Addendum)
Medication Instructions:   Continue all current medications.   Labwork:  none  Testing/Procedures:  none  Follow-Up:  3 months   Any Other Special Instructions Will Be Listed Below (If Applicable).  Make sure to stay hydrated.   If you need a refill on your cardiac medications before your next appointment, please call your pharmacy.

## 2023-07-31 DIAGNOSIS — Z992 Dependence on renal dialysis: Secondary | ICD-10-CM | POA: Diagnosis not present

## 2023-07-31 DIAGNOSIS — Z23 Encounter for immunization: Secondary | ICD-10-CM | POA: Diagnosis not present

## 2023-07-31 DIAGNOSIS — N186 End stage renal disease: Secondary | ICD-10-CM | POA: Diagnosis not present

## 2023-08-01 DIAGNOSIS — Z992 Dependence on renal dialysis: Secondary | ICD-10-CM | POA: Diagnosis not present

## 2023-08-01 DIAGNOSIS — N186 End stage renal disease: Secondary | ICD-10-CM | POA: Diagnosis not present

## 2023-08-01 DIAGNOSIS — Z23 Encounter for immunization: Secondary | ICD-10-CM | POA: Diagnosis not present

## 2023-08-02 DIAGNOSIS — Z992 Dependence on renal dialysis: Secondary | ICD-10-CM | POA: Diagnosis not present

## 2023-08-02 DIAGNOSIS — N186 End stage renal disease: Secondary | ICD-10-CM | POA: Diagnosis not present

## 2023-08-03 DIAGNOSIS — N186 End stage renal disease: Secondary | ICD-10-CM | POA: Diagnosis not present

## 2023-08-03 DIAGNOSIS — Z992 Dependence on renal dialysis: Secondary | ICD-10-CM | POA: Diagnosis not present

## 2023-08-04 DIAGNOSIS — N186 End stage renal disease: Secondary | ICD-10-CM | POA: Diagnosis not present

## 2023-08-04 DIAGNOSIS — Z992 Dependence on renal dialysis: Secondary | ICD-10-CM | POA: Diagnosis not present

## 2023-08-05 DIAGNOSIS — N186 End stage renal disease: Secondary | ICD-10-CM | POA: Diagnosis not present

## 2023-08-05 DIAGNOSIS — Z992 Dependence on renal dialysis: Secondary | ICD-10-CM | POA: Diagnosis not present

## 2023-08-05 NOTE — Progress Notes (Signed)
Radiation Oncology         ([phone]) [phone] ________________________________  Initial Outpatient Consultation  Name: Bradley Black MRN: [id number]  Date: 08/06/2023  DOB: 1945-11-18  NW:[id number], Priscille Heidelberg, MD  Scarlette Ar, MD   REFERRING PHYSICIAN: Scarlette Ar, MD  DIAGNOSIS:  No diagnosis found.    Cancer Staging  No matching staging information was found for the patient.  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 male who presented to his PCP on 04/22/23 for evaluation of a small but increasing painful cyst on the right side of his cheek in the parotid area which had been present for several months.  A soft tissue neck CT with contrast was subsequently performed on 05/07/23 which showed a 2 cm cystic mass along the anterolateral margin of the right parotid space suspicious for necrotic lymph node. No other soft tissue abnormalities were appreciated in the neck.   He was subsequently referred to Dr. Ernestene Kiel at [facility] on 06/04/23 for further management. FNA of the right parotid mass collected during this visit showed atypical cells present in a background of extensive necrosis, concerning for malignancy.    For further staging work-up, a PET scan was performed on 06/27/23 which showed: an increase in size of the centrally necrotic anterior right parotid mass with peripheral hypermetabolism, measuring 2.9 cm (previously 2.0 cm), compatible with primary parotid malignancy. PET otherwise showed no hypermetabolic locoregional adenopathy in the neck, and no evidence of hypermetabolic distant metastatic disease.   Based on Dr. Harvie Junior recommendation, he opted to proceed with a right radical parotidectomy with wide local  excision of the facial skin, right radical neck dissection, and preservation of the facial nerve, on 07/05/23.  Pathology from the procedure revealed: tumor the size of 2.6 cm; histology of focally keratinizing squamous cell carcinoma; positive for PNI, negative for LVI; nodal status of 24/24 dissected right cervical lymph nodes negative for carcinoma; all facial skin margins negative for carcinoma; right external jugular tissue biopsy biopsy negative for carcinoma.   Post-operatively, he had a brief bout of chest pain and tropinemia while hospitalized. He has since been evaluated by cardiology and underwent stent placement on 07/25/23.   Swallowing issues, if any: none  Weight Changes: none  Pain status: ***  Other symptoms: reports some facial dropping post-operatively, also developed paralytic brow ptosis and mild lagophthalmos post-op  Tobacco history, if any: former smoker, quit in 1972   ETOH abuse, if any: does not currently drink   Prior cancers, if any: none  PREVIOUS RADIATION THERAPY: No  PAST MEDICAL HISTORY:  has a past medical history of BPH (benign prostatic hyperplasia), CAD S/P percutaneous coronary angioplasty, Cataract, Chronic back pain, CKD (chronic kidney disease), stage III (HCC), Cyst of bursa, Diabetic retinopathy (HCC), DM (diabetes mellitus), type 2 with renal complications (HCC), Essential hypertension, Frequent PVCs, GERD (gastroesophageal reflux disease), Hypertensive retinopathy, Ischemic cardiomyopathy (11/2011), Left carotid artery stenosis, Left carotid artery stenosis, Mixed hyperlipidemia, Myocardial infarction (HCC), and 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    PAST SURGICAL HISTORY: Past Surgical History:  Procedure Laterality Date   AV FISTULA PLACEMENT Left 07/28/2020   Procedure: LEFT ARM ARTERIOVENOUS (AV) FISTULA  CREATION;  Surgeon: Larina Earthly, MD;  Location: [facility];  Service:  Vascular;  Laterality: Left;   BACK SURGERY  10/01/1968   BIOPSY  01/25/2022    Procedure: BIOPSY;  Surgeon: Corbin Ade, MD;  Location: [facility];  Service: Endoscopy;;   CARDIAC CATHETERIZATION  10/02/2011   CATARACT EXTRACTION W/PHACO Left 09/14/2022   Procedure: CATARACT EXTRACTION PHACO AND INTRAOCULAR LENS PLACEMENT (IOC);  Surgeon: Fabio Pierce, MD;  Location: [facility];  Service: Ophthalmology;  Laterality: Left;  CDE: 8.66   CATARACT EXTRACTION W/PHACO Right 09/28/2022   Procedure: CATARACT EXTRACTION PHACO AND INTRAOCULAR LENS PLACEMENT (IOC);  Surgeon: Fabio Pierce, MD;  Location: [facility];  Service: Ophthalmology;  Laterality: Right;  CDE 7.79   COLONOSCOPY WITH PROPOFOL N/A 01/25/2022   Procedure: COLONOSCOPY WITH PROPOFOL;  Surgeon: Corbin Ade, MD;  [facility];  Service: Endoscopy;  Laterality: N/A;  10:30am   CORONARY ANGIOPLASTY  10/01/1992   OM   CORONARY ANGIOPLASTY  10/02/1995   LAD   CORONARY ANGIOPLASTY WITH STENT PLACEMENT  10/01/1997   RCA   CORONARY ANGIOPLASTY WITH STENT PLACEMENT  10/02/1999   RCA   CORONARY ARTERY BYPASS GRAFT  12/27/2011   Procedure: CORONARY ARTERY BYPASS GRAFTING (CABG);  Surgeon: Purcell Nails, MD;  Location: [facility] OR;  Service: Open Heart Surgery;  Laterality: N/A;  Times four. On pump. Using endoscopically harvested right greater saphenous vein and left internal mammary artery.    CORONARY STENT INTERVENTION N/A 07/25/2023   Procedure: CORONARY STENT INTERVENTION;  Surgeon: Kathleene Hazel, MD;  Location: [facility] LAB;  Service: Cardiovascular;  Laterality: N/A;   ESOPHAGOGASTRODUODENOSCOPY (EGD) WITH PROPOFOL N/A 01/25/2022   Procedure: ESOPHAGOGASTRODUODENOSCOPY (EGD) WITH PROPOFOL;  Surgeon: Corbin Ade, MD;  [facility];  Service: Endoscopy;  Laterality: N/A;   HERNIA REPAIR Bilateral    INCISION AND DRAINAGE OF WOUND  10/01/2004   axilla   INSERTION OF DIALYSIS CATHETER Right 07/25/2020   Procedure: INSERTION OF DIALYSIS CATHETER;  Surgeon: Lucretia Roers, MD;  Location: [facility];  Service: General;  Laterality: Right;   INTRAOPERATIVE TRANSESOPHAGEAL ECHOCARDIOGRAM  12/27/2011   Global hypokinesis with EF of 40-45%, improved LAD distribution wall motion.   LEFT HEART CATH AND CORS/GRAFTS ANGIOGRAPHY N/A 07/25/2023   Procedure: LEFT HEART CATH AND CORS/GRAFTS ANGIOGRAPHY;  Surgeon: Kathleene Hazel, MD;  Location: [facility];  Service: Cardiovascular;  Laterality: N/A;   LEFT HEART CATHETERIZATION WITH CORONARY ANGIOGRAM N/A 12/13/2011   Procedure: LEFT HEART CATHETERIZATION WITH CORONARY ANGIOGRAM;  Surgeon: Marykay Lex, MD;  Location: [facility] CATH LAB;  Service: Cardiovascular;  Laterality: N/A;   LUMBAR FUSION     MASS EXCISION  10/24/2011   R arm   PAROTIDECTOMY Right 07/05/2023   Procedure: 1. Right radical parotidectomy with wide local excision facial skin 3x3cm, dissection and preservation of facial nerve (CPT 42415, 11644);  Surgeon: Scarlette Ar, MD;  Location: [facility] OR;  Service: ENT;  Laterality: Right;   POLYPECTOMY  01/25/2022   Procedure: POLYPECTOMY;  Surgeon: Corbin Ade, MD;  [facility];  Service: Endoscopy;;   RADICAL NECK DISSECTION Right 07/05/2023   Procedure: 2. Right modified radical neck dissection 1-3 (CPT [phone]) 3. Cervicofacial rotation advancement flap 10x10cm for closure of 3x3cm defect (CPT 14040);  Surgeon: Scarlette Ar, MD;  Location: [facility] OR;  Service: ENT;  Laterality: Right;   RIGHT HEART CATH N/A 07/12/2020   Procedure: RIGHT HEART CATH;  Surgeon: Lyn Records, MD;  Location: [facility] INVASIVE CV LAB;  Service:  Cardiovascular;  Laterality: N/A;   TRANSESOPHAGEAL ECHOCARDIOGRAM  10/02/2011    FAMILY HISTORY: family history includes Cancer in his father and mother.  SOCIAL HISTORY:  reports that he quit smoking about 36 years ago. His smoking use included cigarettes. He started smoking about 51 years ago. He has a 15 pack-year smoking history. He has never used smokeless tobacco. He  reports that he does not drink alcohol and does not use drugs.  ALLERGIES: Patient has no known allergies.  MEDICATIONS:  Current Outpatient Medications  Medication Sig Dispense Refill   aspirin EC 81 MG tablet Take 81 mg by mouth daily.     atorvastatin (LIPITOR) 40 MG tablet Take 1 tablet (40 mg total) by mouth daily. 90 tablet 3   brimonidine (ALPHAGAN) 0.2 % ophthalmic solution Place 1 drop into both eyes 2 (two) times daily.     Cholecalciferol (VITAMIN D3) 25 MCG (1000 UT) CAPS Take 1 capsule (1,000 Units total) by mouth daily at 6 (six) AM. 60 capsule 6   clopidogrel (PLAVIX) 75 MG tablet Take 1 tablet (75 mg total) by mouth daily. 90 tablet 1   cyclobenzaprine (FLEXERIL) 5 MG tablet Take 1 tablet (5 mg total) by mouth 3 (three) times daily as needed for muscle spasms. (Patient not taking: Reported on 07/30/2023) 30 tablet 1   doxazosin (CARDURA) 1 MG tablet Take 2 mg by mouth daily.     ferrous sulfate 325 (65 FE) MG tablet Take 325 mg by mouth daily with breakfast.     hydrALAZINE (APRESOLINE) 50 MG tablet TAKE ONE AND ONE-HALF TABLETS (75MG  TOTAL) BY MOUTH THREE TIMES DAILY 270 tablet 1   isosorbide mononitrate (IMDUR) 30 MG 24 hr tablet Take 1 tablet (30 mg total) by mouth daily. 30 tablet 1   ketorolac (ACULAR) 0.5 % ophthalmic solution Place 1 drop into both eyes in the morning, at noon, and at bedtime.     levothyroxine (SYNTHROID) 50 MCG tablet TAKE ONE TABLET ( TOTAL) BY MOUTH DAILY BEFORE BREAKFAST 90 tablet 1   multivitamin (RENA-VIT) TABS tablet Take 1 tablet by mouth daily.     pantoprazole (PROTONIX) 40 MG tablet TAKE ONE TABLET BY MOUTH TWICE A DAY 180 tablet 0   sacubitril-valsartan (ENTRESTO) 97-103 MG TAKE ONE TABLET BY MOUTH TWICE A DAY 60 tablet 3   sildenafil (VIAGRA) 100 MG tablet Take 0.5-1 tablets (50-100 mg total) by mouth daily as needed for erectile dysfunction. (Patient not taking: Reported on 07/30/2023) 60 tablet 1   spironolactone (ALDACTONE) 25 MG  tablet Take 0.5 tablets (12.5 mg total) by mouth daily. 15 tablet 6   torsemide (DEMADEX) 20 MG tablet Take 20 mg by mouth daily.     No current facility-administered medications for this encounter.    REVIEW OF SYSTEMS:  Notable for that above.   PHYSICAL EXAM:  vitals were not taken for this visit.   General: Alert and oriented, in no acute distress HEENT: Head is normocephalic. Extraocular movements are intact. Oropharynx is notable for ***. Neck: Neck is notable for *** Heart: Regular in rate and rhythm with no murmurs, rubs, or gallops. Chest: Clear to auscultation bilaterally, with no rhonchi, wheezes, or rales. Abdomen: Soft, nontender, nondistended, with no rigidity or guarding. Extremities: No cyanosis or edema. Lymphatics: see Neck Exam Skin: No concerning lesions. Musculoskeletal: symmetric strength and muscle tone throughout. Neurologic: Cranial nerves II through XII are grossly intact. No obvious focalities. Speech is fluent. Coordination is intact. Psychiatric: Judgment and insight are intact. Affect  is appropriate.   ECOG = ***  0 - Asymptomatic (Fully active, able to carry on all predisease activities without restriction)  1 - Symptomatic but completely ambulatory (Restricted in physically strenuous activity but ambulatory and able to carry out work of a light or sedentary nature. For example, light housework, office work)  2 - Symptomatic, <50% in bed during the day (Ambulatory and capable of all self care but unable to carry out any work activities. Up and about more than 50% of waking hours)  3 - Symptomatic, >50% in bed, but not bedbound (Capable of only limited self-care, confined to bed or chair 50% or more of waking hours)  4 - Bedbound (Completely disabled. Cannot carry on any self-care. Totally confined to bed or chair)  5 - Death   Santiago Glad MM, Creech RH, Tormey DC, et al. [phone]). "Toxicity and response criteria of the [facility]".  Am. Evlyn Clines. Oncol. 5 (6): 649-55   LABORATORY DATA:  Lab Results  Component Value Date   WBC 10.2 07/18/2023   HGB 10.5 (L) 07/18/2023   HCT 32.3 (L) 07/18/2023   MCV 100 (H) 07/18/2023   PLT 218 07/18/2023   CMP     Component Value Date/Time   NA 140 07/18/2023 1226   K 5.1 07/18/2023 1226   CL 103 07/18/2023 1226   CO2 23 07/18/2023 1226   GLUCOSE 206 (H) 07/18/2023 1226   GLUCOSE 193 (H) 07/06/2023 0951   BUN 46 (H) 07/18/2023 1226   CREATININE 3.67 (H) 07/18/2023 1226   CREATININE 4.59 (H) 05/21/2023 0925   CALCIUM 10.3 (H) 07/18/2023 1226   PROT 6.1 (L) 07/06/2023 0951   ALBUMIN 3.4 (L) 07/06/2023 0951   AST 22 07/06/2023 0951   ALT 10 07/06/2023 0951   ALKPHOS 62 07/06/2023 0951   BILITOT 1.2 07/06/2023 0951   GFRNONAA 13 (L) 07/06/2023 0951   GFRNONAA 21 (L) 05/01/2019 0945   GFRAA 24 (L) 05/01/2019 0945      Lab Results  Component Value Date   TSH 3.53 04/22/2023     RADIOGRAPHY: CARDIAC CATHETERIZATION  Result Date: 07/25/2023   Prox RCA to Mid RCA lesion is 100% stenosed.   Prox Graft lesion is 99% stenosed.   Mid LAD lesion is 100% stenosed.   Origin to Prox Graft lesion is 100% stenosed.   Ost Cx to Mid Cx lesion is 99% stenosed.   2nd Mrg lesion is 100% stenosed.   A drug-eluting stent was successfully placed using a SYNERGY XD 2.75X16.   Post intervention, there is a 0% residual stenosis.   SVG graft was visualized by angiography.   LIMA graft was visualized by angiography and is normal in caliber.   SVG graft was visualized by angiography.   SVG graft was visualized by angiography and is normal in caliber. Severe three vessel CAD s/p 4V CABG with 3/4 patent bypass grafts Chronic occlusion mid LAD. The mid and distal LAD fills from the patent LIMA graft. SVG to Diagonal is occluded Severe proximal Circumflex stenosis. Patent vein graft to the large obtuse marginal branch. Large dominant RCA with chronic proximal occlusion within the old stent. Patent vein  graft to the PDA. Severe stenosis proximal body of the vein graft Successful PTCA/DES x 1 proximal body of the vein graft to the PDA Elevated LV filling pressure Recommendations: ASA and Plavix for at least six months. Same day post PCI discharge.   ECHOCARDIOGRAM COMPLETE  Result Date: 07/07/2023    ECHOCARDIOGRAM  REPORT   Patient Name:   Bradley Black Date of Exam: 07/07/2023 Medical Rec #:  [id number]       Height:       68.0 in Accession #:    [id number]      Weight:       135.6 lb Date of Birth:  1945-10-22       BSA:          1.733 m Patient Age:    77 years        BP:           143/80 mmHg Patient Gender: M               HR:           78 bpm. Exam Location:  Inpatient Procedure: 2D Echo, Color Doppler and Cardiac Doppler Indications:    chest pain  History:        Patient has prior history of Echocardiogram examinations, most                 recent 11/17/2020. Cardiomyopathy, Prior CABG, end stage renal                 disease; Risk Factors:Diabetes and Hypertension.  Sonographer:    Delcie Roch RDCS Referring Phys: [id number] PETER C NISHAN IMPRESSIONS  1. Septal , apical , inferior wall hypokinesis . Left ventricular ejection fraction, by estimation, is 40 to 45%. The left ventricle has mildly decreased function. The left ventricle has no regional wall motion abnormalities. The left ventricular internal cavity size was mildly dilated. There is mild left ventricular hypertrophy. Left ventricular diastolic parameters were normal.  2. Right ventricular systolic function is normal. The right ventricular size is normal. There is mildly elevated pulmonary artery systolic pressure.  3. Left atrial size was severely dilated.  4. Right atrial size was mildly dilated.  5. The mitral valve is abnormal. Mild to moderate mitral valve regurgitation. No evidence of mitral stenosis.  6. Tricuspid valve regurgitation is mild to moderate.  7. The aortic valve is tricuspid. Aortic valve regurgitation is not visualized. No  aortic stenosis is present.  8. The inferior vena cava is normal in size with greater than 50% respiratory variability, suggesting right atrial pressure of 3 mmHg. FINDINGS  Left Ventricle: Septal , apical , inferior wall hypokinesis. Left ventricular ejection fraction, by estimation, is 40 to 45%. The left ventricle has mildly decreased function. The left ventricle has no regional wall motion abnormalities. The left ventricular internal cavity size was mildly dilated. There is mild left ventricular hypertrophy. Left ventricular diastolic parameters were normal. Right Ventricle: The right ventricular size is normal. No increase in right ventricular wall thickness. Right ventricular systolic function is normal. There is mildly elevated pulmonary artery systolic pressure. The tricuspid regurgitant velocity is 3.22  m/s, and with an assumed right atrial pressure of 3 mmHg, the estimated right ventricular systolic pressure is 44.5 mmHg. Left Atrium: Left atrial size was severely dilated. Right Atrium: Right atrial size was mildly dilated. Pericardium: There is no evidence of pericardial effusion. Mitral Valve: The mitral valve is abnormal. There is mild thickening of the mitral valve leaflet(s). Mild to moderate mitral valve regurgitation. No evidence of mitral valve stenosis. Tricuspid Valve: The tricuspid valve is normal in structure. Tricuspid valve regurgitation is mild to moderate. No evidence of tricuspid stenosis. Aortic Valve: The aortic valve is tricuspid. Aortic valve regurgitation is not visualized. No aortic stenosis is present. Pulmonic Valve:  The pulmonic valve was normal in structure. Pulmonic valve regurgitation is mild. No evidence of pulmonic stenosis. Aorta: The aortic root is normal in size and structure. Venous: The inferior vena cava is normal in size with greater than 50% respiratory variability, suggesting right atrial pressure of 3 mmHg. IAS/Shunts: No atrial level shunt detected by color flow  Doppler.  LEFT VENTRICLE PLAX 2D LVIDd:         5.10 cm      Diastology LVIDs:         4.20 cm      LV e' medial:    6.31 cm/s LV PW:         1.10 cm      LV E/e' medial:  15.3 LV IVS:        1.20 cm      LV e' lateral:   11.70 cm/s LVOT diam:     2.00 cm      LV E/e' lateral: 8.3 LV SV:         64 LV SV Index:   37 LVOT Area:     3.14 cm  LV Volumes (MOD) LV vol d, MOD A4C: 117.0 ml LV vol s, MOD A4C: 65.7 ml LV SV MOD A4C:     117.0 ml RIGHT VENTRICLE RV Basal diam:  2.90 cm RV S prime:     9.79 cm/s TAPSE (M-mode): 1.8 cm LEFT ATRIUM             Index        RIGHT ATRIUM           Index LA diam:        5.20 cm 3.00 cm/m   RA Area:     18.70 cm LA Vol (A2C):   67.3 ml 38.85 ml/m  RA Volume:   50.40 ml  29.09 ml/m LA Vol (A4C):   68.7 ml 39.65 ml/m LA Biplane Vol: 67.9 ml 39.19 ml/m  AORTIC VALVE LVOT Vmax:   97.40 cm/s LVOT Vmean:  62.900 cm/s LVOT VTI:    0.203 m  AORTA Ao Root diam: 3.40 cm Ao Asc diam:  3.70 cm MITRAL VALVE               TRICUSPID VALVE MV Area (PHT): 4.89 cm    TR Peak grad:   41.5 mmHg MV Decel Time: 155 msec    TR Vmax:        322.00 cm/s MV E velocity: 96.70 cm/s MV A velocity: 83.20 cm/s  SHUNTS MV E/A ratio:  1.16        Systemic VTI:  0.20 m                            Systemic Diam: 2.00 cm Charlton Haws MD Electronically signed by Charlton Haws MD Signature Date/Time: 07/07/2023/11:47:54 AM    Final       IMPRESSION/PLAN:  This is a delightful patient with head and neck cancer. I *** recommend radiotherapy for this patient.  We discussed the potential risks, benefits, and side effects of radiotherapy. We talked in detail about acute and late effects. We discussed that some of the most bothersome acute effects may be mucositis, dysgeusia, salivary changes, skin irritation, hair loss, dehydration, weight loss and fatigue. We talked about late effects which include but are not necessarily limited to dysphagia, hypothyroidism, nerve injury, vascular injury, spinal cord injury,  xerostomia, trismus, neck edema, dental issues, non-healing wound,  and potentially fatal injury to any of the tissues in the head and neck region. No guarantees of treatment were given. A consent form was signed and placed in the patient's medical record. The patient is enthusiastic about proceeding with treatment. I look forward to participating in the patient's care.    Simulation (treatment planning) will take place ***  We also discussed that the treatment of head and neck cancer is a multidisciplinary process to maximize treatment outcomes and quality of life. For this reason the following referrals have been or will be made:  *** Medical oncology to discuss chemotherapy   *** Dentistry for dental evaluation, possible extractions in the radiation fields, and /or advice on reducing risk of cavities, osteoradionecrosis, or other oral issues.  *** Nutritionist for nutrition support during and after treatment.  *** Speech language pathology for swallowing and/or speech therapy.  *** Social work for social support.   *** Physical therapy due to risk of lymphedema in neck and deconditioning.  *** Baseline labs including TSH.  On date of service, in total, I spent *** minutes on this encounter. Patient was seen in person.  __________________________________________   Lonie Peak, MD  This document serves as a record of services personally performed by Lonie Peak, MD. It was created on her behalf by Neena Rhymes, a trained medical scribe. The creation of this record is based on the scribe's personal observations and the provider's statements to them. This document has been checked and approved by the attending provider.

## 2023-08-06 ENCOUNTER — Encounter: Payer: Self-pay | Admitting: Radiation Oncology

## 2023-08-06 ENCOUNTER — Ambulatory Visit
Admission: RE | Admit: 2023-08-06 | Discharge: 2023-08-06 | Disposition: A | Discharge status: Home / Self Care | Payer: Medicare Other | Source: Ambulatory Visit | Attending: Radiation Oncology | Admitting: Radiation Oncology

## 2023-08-06 VITALS — BP 135/66 | HR 69 | Temp 97.7°F | Resp 20 | Ht 68.0 in | Wt 140.4 lb

## 2023-08-06 DIAGNOSIS — I2510 Atherosclerotic heart disease of native coronary artery without angina pectoris: Secondary | ICD-10-CM | POA: Diagnosis not present

## 2023-08-06 DIAGNOSIS — E11319 Type 2 diabetes mellitus with unspecified diabetic retinopathy without macular edema: Secondary | ICD-10-CM | POA: Insufficient documentation

## 2023-08-06 DIAGNOSIS — Z79890 Hormone replacement therapy (postmenopausal): Secondary | ICD-10-CM | POA: Diagnosis not present

## 2023-08-06 DIAGNOSIS — N186 End stage renal disease: Secondary | ICD-10-CM | POA: Diagnosis not present

## 2023-08-06 DIAGNOSIS — Z992 Dependence on renal dialysis: Secondary | ICD-10-CM | POA: Diagnosis not present

## 2023-08-06 DIAGNOSIS — M549 Dorsalgia, unspecified: Secondary | ICD-10-CM | POA: Insufficient documentation

## 2023-08-06 DIAGNOSIS — N183 Chronic kidney disease, stage 3 unspecified: Secondary | ICD-10-CM | POA: Diagnosis not present

## 2023-08-06 DIAGNOSIS — H57819 Brow ptosis, unspecified: Secondary | ICD-10-CM | POA: Diagnosis not present

## 2023-08-06 DIAGNOSIS — I6522 Occlusion and stenosis of left carotid artery: Secondary | ICD-10-CM | POA: Insufficient documentation

## 2023-08-06 DIAGNOSIS — Z7982 Long term (current) use of aspirin: Secondary | ICD-10-CM | POA: Diagnosis not present

## 2023-08-06 DIAGNOSIS — C07 Malignant neoplasm of parotid gland: Secondary | ICD-10-CM

## 2023-08-06 DIAGNOSIS — N400 Enlarged prostate without lower urinary tract symptoms: Secondary | ICD-10-CM | POA: Diagnosis not present

## 2023-08-06 DIAGNOSIS — K219 Gastro-esophageal reflux disease without esophagitis: Secondary | ICD-10-CM | POA: Diagnosis not present

## 2023-08-06 DIAGNOSIS — Z79899 Other long term (current) drug therapy: Secondary | ICD-10-CM | POA: Insufficient documentation

## 2023-08-06 DIAGNOSIS — I129 Hypertensive chronic kidney disease with stage 1 through stage 4 chronic kidney disease, or unspecified chronic kidney disease: Secondary | ICD-10-CM | POA: Diagnosis not present

## 2023-08-06 DIAGNOSIS — E785 Hyperlipidemia, unspecified: Secondary | ICD-10-CM | POA: Insufficient documentation

## 2023-08-06 DIAGNOSIS — I255 Ischemic cardiomyopathy: Secondary | ICD-10-CM | POA: Diagnosis not present

## 2023-08-06 DIAGNOSIS — Z7902 Long term (current) use of antithrombotics/antiplatelets: Secondary | ICD-10-CM | POA: Diagnosis not present

## 2023-08-06 DIAGNOSIS — Z87891 Personal history of nicotine dependence: Secondary | ICD-10-CM | POA: Insufficient documentation

## 2023-08-06 DIAGNOSIS — I252 Old myocardial infarction: Secondary | ICD-10-CM | POA: Insufficient documentation

## 2023-08-06 NOTE — Progress Notes (Signed)
Oncology Nurse Navigator Documentation   Met with patient during initial consult with Dr. Basilio Cairo. He was accompanied by his wife.  Further introduced myself as his/their Navigator, explained my role as a member of the Care Team. Provided New Patient resource guide binder: Contact information for physicians, this navigator, other members of the Care Team Advance Directive information; provided [facility] AD booklet at their request,  Fall Prevention Patient Safety Plan Financial Assistance Information sheet Symptom Management Clinic information [facility] campus map with highlight of [facility] SLP Information sheet Head and Neck cancer basics Nutrition information Patient and family support information including Spiritual care/Chaplain information, Peer mentor program, health and wellness classes, and the survivorship program Community resources  Assisted with post-consult appt scheduling. I explained procedures for lobby registration, arrival to Radiation Waiting, and arrival to treatment area.    They verbalized understanding of information provided. I encouraged them to call with questions/concerns moving forward.  Hedda Slade, RN, BSN, OCN Head & Neck Oncology Nurse Navigator [facility] [phone]

## 2023-08-06 NOTE — Progress Notes (Signed)
Head and Neck Cancer Location of Tumor / Histology:  CT Soft Tissue Neck W Contrast  IMPRESSION: 1. Solitary thick-walled and rim enhancing cystic 2 cm mass along the anterolateral margin of the right parotid space is most suspicious for Necrotic Lymph Node, such as can be seen with metastatic nodal disease of a regional skin cancer. Negative CT appearance of other neck soft tissues. Primary salivary gland neoplasm also possible, while metastatic disease from a distant primary is less likely. Recommend ENT consultation for biopsy or excision.   2. Advanced calcified atherosclerosis including bulky bilateral carotid bifurcation plaque with hemodynamically significant ICA stenoses, RADIOGRAPHIC STRING SIGN on the Right. Aortic Atherosclerosis (ICD10-I70.0).  NM PET Image Initial (PI) Skull Base To Thigh 06/27/2023  IMPRESSION: 1. Peripherally hypermetabolic centrally necrotic anterior right parotid 2.9 cm mass, increased from 2.0 cm on 05/07/2023 neck CT, compatible with primary parotid malignancy. 2. No hypermetabolic locoregional adenopathy in the neck. No hypermetabolic distant metastatic disease. 3. Small volume ascites. Percutaneous dialysis catheter terminates in the left lower quadrant. 4. Chronic findings include: Mild prostatomegaly. Mild cardiomegaly. Mild left colonic diverticulosis. Aortic Atherosclerosis (ICD10-I70.0).  Patient presented symptoms of:  who presents with approximately 4-month history of right parotid mass. Underwent CT neck ordered by primary care physician demonstrating findings concerning for necrotic lymph node.  The patient reports presence of this mass in his right face for approximately 4 months. It is occasionally uncomfortable   Biopsies revealed:  07-05-23 FINAL MICROSCOPIC DIAGNOSIS:  A. SKIN, RIGHT FACIAL MARGIN 12-3, BIOPSY: - Negative for carcinoma  B. SKIN, RIGHT FACIAL MARGIN 3-6, BIOPSY: - Negative for carcinoma  C. SKIN, RIGHT  FACIAL MARGIN 6-9, BIOPSY: - Negative for carcinoma  D. SKIN, RIGHT FACIAL MARGIN 9-12, BIOPSY: - Negative for carcinoma  E. SKIN, RIGHT AURICULAR NERVE MARGIN, BIOPSY: - Benign nerve, negative for carcinoma  F. PAROTID TUMOR, RIGHT, RADICAL PAROTIDECTOMY: - Squamous cell carcinoma, focally keratinizing, 2.6 cm, pT2 pN0 - Perineural invasion is present - Lymphovascular invasion not identified - See oncology table and comment  G. LYMPH NODE, RIGHT LEVEL 1B, NECK DISSECTION: - Six lymph nodes, negative for carcinoma (0/6)  H. LYMPH NODE, RIGHT LEVEL 1A, NECK DISSECTION: - Two lymph nodes, negative for carcinoma (0/2)  I. LYMPH NODE, RIGHT LEVEL 2, NECK DISSECTION: - Seven lymph nodes, negative for carcinoma (0/7)  J. LYMPH NODE, RIGHT LEVEL 3, NECK DISSECTION: - Nine lymph nodes, negative for carcinoma (0/9)  K. SOFT TISSUE, RIGHT EXTERNAL JUGULAR, BIOPSY: - Benign fibrovascular tissue, negative for carcinoma  ONCOLOGY TABLE:  MAJOR SALIVARY GLANDS, CARCINOMA: Resection  Procedure: Parotidectomy Tumor Focality: Unifocal Tumor Site: Parotid gland Tumor Laterality: Right Tumor Size: 2.6 cm Histologic Type: Squamous cell carcinoma High Grade Transformation: Not applicable Macroscopic Tumor Extent: Limited to parotid gland Lymphovascular Invasion: Not identified Perineural Invasion: Present Margin Status: All margins negative for invasive carcinoma      Distance from Invasive Tumor to Closest Margin: 0.2 cm      Closest Margin(s) to Carcinoma (if possible): resection margin      Margin(s) Involved by Carcinoma (if possible): None Regional Lymph Node Status:      Number of Lymph Nodes with Tumor: 0      Laterality of Lymph Node(s) with Tumor: Not applicable      Size of Largest Nodal Metastatic Deposit: Not applicable      Extranodal Extension: Not applicable      Number of Lymph Nodes Examined: 24 Distant Metastasis:      Distant Site(s)  Involved: Not  applicable Pathologic Stage Classification (pTNM, AJCC 8th Edition): pT 2, pN 0 Ancillary Studies: Can be performed upon request Representative Tumor Block: F1 Comment(s):While this tumor is staged as a primary parotid gland squamous cell carcinoma, it is an uncommon tumor and metastasis to an intraparotid, obliterated lymph node is favored if alternate primary sites such as skin have been excluded. There is abundant lymphoid tissue associated with the tumor; however, residual lymph node capsule is not identified.  No mucinous differentiation is identified (mucicarmine stain is negative in tumor) to indicate a primary parotid mucoepidermoid carcinoma.  Immunohistochemical stains reveal tumor cells are positive for p40, p63 and negative for CK7, CD117, and calponin.  P16 is patchy and interpreted as equivocal.  Controls are adequate.  (v4.1.1.0)  INTRAOPERATIVE DIAGNOSIS:   A.  Skin, right facial margin 12-3: "Negative for carcinoma." Intraoperative diagnosis rendered by Dr. Reynolds Bowl at 14:38 on 05 July 2023.  B. Skin, right facial margin 3-6: "Negative for carcinoma." Intraoperative diagnosis rendered by Dr. Reynolds Bowl at 14:38 on 05 July 2023.  C. Skin, right facial margin 6-9: "Negative for carcinoma." Intraoperative diagnosis rendered by Dr. Reynolds Bowl at 14:38 on 05 July 2023.  D. Skin, right facial margin 9-12: "Negative for carcinoma." Intraoperative diagnosis rendered by Dr. Reynolds Bowl at 14:38 on 05 July 2023.  E.  Right auricular nerve margin: "Negative for carcinoma." Intraoperative diagnosis rendered by Dr. Reynolds Bowl at 14:51 on 05 July 2023.   Nutrition Status Yes No Comments  Weight changes? [x]  []  10 pounds  Swallowing concerns? [x]  []  Some difficulty  PEG? []  [x]     Referrals Yes No Comments  Social Work? []  [x]    Dentistry? []  [x]    Swallowing therapy? []  [x]    Nutrition? []  [x]    Med/Onc? []  [x]     Safety Issues Yes No Comments  Prior radiation? []  [x]    Pacemaker/ICD? []  [x]     Possible current pregnancy? []  [x]    Is the patient on methotrexate? []  [x]     Tobacco/Marijuana/Snuff/ETOH use: former smoker  Past/Anticipated interventions by otolaryngology, if any:  Scarlette Ar, MD 07/05/2023  Pre-Op Diagnosis: Mass of right parotid gland   Post-Op Diagnosis: Mass of right parotid gland   Procedure: Right radical parotidectomy with wide local excision facial skin 3x3cm, dissection and preservation of facial nerve (CPT 42415, 11644) Right modified radical neck dissection 1-3 (CPT [phone]) Cervicofacial rotation advancement flap 10x10cm for closure of 3x3cm defect (CPT 14040)   Dione Housekeeper, MD - 07/11/2023  Assessment & Plan 77 y/o frail 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 Overall doing well - no complications at this time. Facial nerve anticipated frontal weakness mild improvement from hospital discharge. JP drain removed. Skin flaps ecchymosis but intact.   Plan: - Local wound care - daily aqupahor - Leave sutures until next week  - Urgent RT referral and tumor board presentation next week . Doubt role for chemotherapy  Future Appointments  Date Time Provider Department Center  07/16/2023 9:50 AM Susy Frizzle, MD [facility] ENT [place] [facility] [address]  07/26/2023 1:00 PM Dione Housekeeper, MD [facility] [place] [facility] [address]    Past/Anticipated interventions by medical oncology, if any: none in EMR   Current Complaints / other details:  denies any other issues  Vitals:   08/06/23 1025  BP: 135/66  Pulse: 69  Resp: 20  Temp: 97.7  F (36.5 C)  SpO2: 100%  Weight: 63.7 kg  Height: 5\' 8"  (1.727 m)   Patient decline the referral to a counselor  for his depression screening.

## 2023-08-07 DIAGNOSIS — N186 End stage renal disease: Secondary | ICD-10-CM | POA: Diagnosis not present

## 2023-08-07 DIAGNOSIS — Z992 Dependence on renal dialysis: Secondary | ICD-10-CM | POA: Diagnosis not present

## 2023-08-08 DIAGNOSIS — Z992 Dependence on renal dialysis: Secondary | ICD-10-CM | POA: Diagnosis not present

## 2023-08-08 DIAGNOSIS — N186 End stage renal disease: Secondary | ICD-10-CM | POA: Diagnosis not present

## 2023-08-09 DIAGNOSIS — Z992 Dependence on renal dialysis: Secondary | ICD-10-CM | POA: Diagnosis not present

## 2023-08-09 DIAGNOSIS — N186 End stage renal disease: Secondary | ICD-10-CM | POA: Diagnosis not present

## 2023-08-10 DIAGNOSIS — Z992 Dependence on renal dialysis: Secondary | ICD-10-CM | POA: Diagnosis not present

## 2023-08-10 DIAGNOSIS — N186 End stage renal disease: Secondary | ICD-10-CM | POA: Diagnosis not present

## 2023-08-11 DIAGNOSIS — N186 End stage renal disease: Secondary | ICD-10-CM | POA: Diagnosis not present

## 2023-08-11 DIAGNOSIS — Z992 Dependence on renal dialysis: Secondary | ICD-10-CM | POA: Diagnosis not present

## 2023-08-12 DIAGNOSIS — N186 End stage renal disease: Secondary | ICD-10-CM | POA: Diagnosis not present

## 2023-08-12 DIAGNOSIS — Z992 Dependence on renal dialysis: Secondary | ICD-10-CM | POA: Diagnosis not present

## 2023-08-12 DIAGNOSIS — C07 Malignant neoplasm of parotid gland: Secondary | ICD-10-CM | POA: Diagnosis not present

## 2023-08-13 ENCOUNTER — Ambulatory Visit
Admission: RE | Admit: 2023-08-13 | Discharge: 2023-08-13 | Disposition: A | Discharge status: Home / Self Care | Payer: Medicare Other | Source: Ambulatory Visit | Attending: Radiation Oncology | Admitting: Radiation Oncology

## 2023-08-13 ENCOUNTER — Other Ambulatory Visit: Payer: Self-pay

## 2023-08-13 DIAGNOSIS — Z510 Encounter for antineoplastic radiation therapy: Secondary | ICD-10-CM | POA: Diagnosis not present

## 2023-08-13 DIAGNOSIS — C07 Malignant neoplasm of parotid gland: Secondary | ICD-10-CM | POA: Insufficient documentation

## 2023-08-13 DIAGNOSIS — Z87891 Personal history of nicotine dependence: Secondary | ICD-10-CM | POA: Diagnosis not present

## 2023-08-13 DIAGNOSIS — N186 End stage renal disease: Secondary | ICD-10-CM | POA: Diagnosis not present

## 2023-08-13 DIAGNOSIS — Z992 Dependence on renal dialysis: Secondary | ICD-10-CM | POA: Diagnosis not present

## 2023-08-13 NOTE — Progress Notes (Signed)
Oncology Nurse Navigator Documentation   Bradley Black presented for his CT simulation today and he tolerated the procedure without difficulty. He knows to call me prior to his 08/21/23 New Start.   Hedda Slade RN, BSN, OCN Head & Neck Oncology Nurse Navigator [facility] Phone # [phone]  Fax # [phone]

## 2023-08-14 DIAGNOSIS — N186 End stage renal disease: Secondary | ICD-10-CM | POA: Diagnosis not present

## 2023-08-14 DIAGNOSIS — Z992 Dependence on renal dialysis: Secondary | ICD-10-CM | POA: Diagnosis not present

## 2023-08-15 ENCOUNTER — Other Ambulatory Visit: Payer: Self-pay

## 2023-08-15 DIAGNOSIS — N186 End stage renal disease: Secondary | ICD-10-CM | POA: Diagnosis not present

## 2023-08-15 DIAGNOSIS — Z992 Dependence on renal dialysis: Secondary | ICD-10-CM | POA: Diagnosis not present

## 2023-08-15 DIAGNOSIS — C07 Malignant neoplasm of parotid gland: Secondary | ICD-10-CM

## 2023-08-16 ENCOUNTER — Telehealth: Payer: Self-pay | Admitting: Cardiology

## 2023-08-16 DIAGNOSIS — N186 End stage renal disease: Secondary | ICD-10-CM | POA: Diagnosis not present

## 2023-08-16 DIAGNOSIS — Z992 Dependence on renal dialysis: Secondary | ICD-10-CM | POA: Diagnosis not present

## 2023-08-16 NOTE — Telephone Encounter (Signed)
Patient canceled Carotid test due to appointment conflict.  Patient stated he will call back to reschedule.

## 2023-08-17 DIAGNOSIS — N186 End stage renal disease: Secondary | ICD-10-CM | POA: Diagnosis not present

## 2023-08-17 DIAGNOSIS — Z992 Dependence on renal dialysis: Secondary | ICD-10-CM | POA: Diagnosis not present

## 2023-08-18 DIAGNOSIS — N186 End stage renal disease: Secondary | ICD-10-CM | POA: Diagnosis not present

## 2023-08-18 DIAGNOSIS — Z992 Dependence on renal dialysis: Secondary | ICD-10-CM | POA: Diagnosis not present

## 2023-08-19 ENCOUNTER — Ambulatory Visit: Payer: Medicare Other

## 2023-08-19 DIAGNOSIS — N186 End stage renal disease: Secondary | ICD-10-CM | POA: Diagnosis not present

## 2023-08-19 DIAGNOSIS — Z992 Dependence on renal dialysis: Secondary | ICD-10-CM | POA: Diagnosis not present

## 2023-08-20 DIAGNOSIS — Z992 Dependence on renal dialysis: Secondary | ICD-10-CM | POA: Diagnosis not present

## 2023-08-20 DIAGNOSIS — C07 Malignant neoplasm of parotid gland: Secondary | ICD-10-CM | POA: Diagnosis not present

## 2023-08-20 DIAGNOSIS — N186 End stage renal disease: Secondary | ICD-10-CM | POA: Diagnosis not present

## 2023-08-20 DIAGNOSIS — Z87891 Personal history of nicotine dependence: Secondary | ICD-10-CM | POA: Diagnosis not present

## 2023-08-20 DIAGNOSIS — Z510 Encounter for antineoplastic radiation therapy: Secondary | ICD-10-CM | POA: Diagnosis not present

## 2023-08-21 ENCOUNTER — Other Ambulatory Visit: Payer: Self-pay

## 2023-08-21 ENCOUNTER — Ambulatory Visit
Admission: RE | Admit: 2023-08-21 | Discharge: 2023-08-21 | Disposition: A | Discharge status: Home / Self Care | Payer: Medicare Other | Source: Ambulatory Visit | Attending: Radiation Oncology | Admitting: Radiation Oncology

## 2023-08-21 DIAGNOSIS — Z510 Encounter for antineoplastic radiation therapy: Secondary | ICD-10-CM | POA: Diagnosis not present

## 2023-08-21 DIAGNOSIS — Z992 Dependence on renal dialysis: Secondary | ICD-10-CM | POA: Diagnosis not present

## 2023-08-21 DIAGNOSIS — Z87891 Personal history of nicotine dependence: Secondary | ICD-10-CM | POA: Diagnosis not present

## 2023-08-21 DIAGNOSIS — N186 End stage renal disease: Secondary | ICD-10-CM | POA: Diagnosis not present

## 2023-08-21 DIAGNOSIS — C07 Malignant neoplasm of parotid gland: Secondary | ICD-10-CM | POA: Diagnosis not present

## 2023-08-21 LAB — RAD ONC ARIA SESSION SUMMARY
Course Elapsed Days: 0
Plan Fractions Treated to Date: 1
Plan Prescribed Dose Per Fraction: 2.5 Gy
Plan Total Fractions Prescribed: 20
Plan Total Prescribed Dose: 50 Gy
Reference Point Dosage Given to Date: 2.5 Gy
Reference Point Session Dosage Given: 2.5 Gy
Session Number: 1

## 2023-08-21 NOTE — Progress Notes (Signed)
Oncology Nurse Navigator Documentation   To provide support, encouragement and care continuity, met with Bradley Black after his initial RT.  He Bradley Black  completed treatment without difficulty, denied questions/concerns. I reviewed the registration/arrival procedure for subsequent treatments. I encouraged them to call me with questions/concerns as treatments proceed.   Hedda Slade RN, BSN, OCN Head & Neck Oncology Nurse Navigator [facility] Phone # [phone]  Fax # [phone]
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
## 2023-08-22 ENCOUNTER — Encounter: Payer: Self-pay | Admitting: Physical Therapy

## 2023-08-22 ENCOUNTER — Ambulatory Visit
Admission: RE | Admit: 2023-08-22 | Discharge: 2023-08-22 | Disposition: A | Discharge status: Home / Self Care | Payer: Medicare Other | Source: Ambulatory Visit | Attending: Radiation Oncology | Admitting: Radiation Oncology

## 2023-08-22 ENCOUNTER — Ambulatory Visit: Payer: Medicare Other | Attending: Radiation Oncology | Admitting: Physical Therapy

## 2023-08-22 ENCOUNTER — Other Ambulatory Visit: Payer: Self-pay

## 2023-08-22 ENCOUNTER — Telehealth: Payer: Self-pay

## 2023-08-22 DIAGNOSIS — C07 Malignant neoplasm of parotid gland: Secondary | ICD-10-CM | POA: Insufficient documentation

## 2023-08-22 DIAGNOSIS — R293 Abnormal posture: Secondary | ICD-10-CM | POA: Insufficient documentation

## 2023-08-22 DIAGNOSIS — Z992 Dependence on renal dialysis: Secondary | ICD-10-CM | POA: Diagnosis not present

## 2023-08-22 DIAGNOSIS — Z87891 Personal history of nicotine dependence: Secondary | ICD-10-CM | POA: Diagnosis not present

## 2023-08-22 DIAGNOSIS — N186 End stage renal disease: Secondary | ICD-10-CM | POA: Diagnosis not present

## 2023-08-22 DIAGNOSIS — Z510 Encounter for antineoplastic radiation therapy: Secondary | ICD-10-CM | POA: Diagnosis not present

## 2023-08-22 LAB — RAD ONC ARIA SESSION SUMMARY
Course Elapsed Days: 1
Plan Fractions Treated to Date: 2
Plan Prescribed Dose Per Fraction: 2.5 Gy
Plan Total Fractions Prescribed: 20
Plan Total Prescribed Dose: 50 Gy
Reference Point Dosage Given to Date: 5 Gy
Reference Point Session Dosage Given: 2.5 Gy
Session Number: 2

## 2023-08-22 NOTE — Telephone Encounter (Signed)
[facility] Clinical Social Work  Clinical Social Work was referred by Statistician for assessment of psychosocial needs.  Clinical Social Worker attempted to contact patient by phone to offer support and assess for needs.  CSW attempted to reach patient 2x, phone appears to pick up and get disconnected. CSW will attempt again.  Marguerita Merles, LCSWA Clinical Social Worker [facility] [phone]

## 2023-08-22 NOTE — Progress Notes (Signed)
Oncology Nurse Navigator Documentation   I met with Bradley Black and his wife before his appointment with PT today. He is tolerating treatment well at this time. He and his wife know to call me if they have any questions or concerns during his radiation treatment.   Hedda Slade RN, BSN, OCN Head & Neck Oncology Nurse Navigator [facility] Phone # [phone]  Fax # [phone]

## 2023-08-23 ENCOUNTER — Ambulatory Visit
Admission: RE | Admit: 2023-08-23 | Discharge: 2023-08-23 | Disposition: A | Discharge status: Home / Self Care | Payer: Medicare Other | Source: Ambulatory Visit | Attending: Radiation Oncology | Admitting: Radiation Oncology

## 2023-08-23 ENCOUNTER — Other Ambulatory Visit: Payer: Self-pay

## 2023-08-23 DIAGNOSIS — Z992 Dependence on renal dialysis: Secondary | ICD-10-CM | POA: Diagnosis not present

## 2023-08-23 DIAGNOSIS — Z510 Encounter for antineoplastic radiation therapy: Secondary | ICD-10-CM | POA: Diagnosis not present

## 2023-08-23 DIAGNOSIS — C07 Malignant neoplasm of parotid gland: Secondary | ICD-10-CM | POA: Diagnosis not present

## 2023-08-23 DIAGNOSIS — N186 End stage renal disease: Secondary | ICD-10-CM | POA: Diagnosis not present

## 2023-08-23 DIAGNOSIS — Z87891 Personal history of nicotine dependence: Secondary | ICD-10-CM | POA: Diagnosis not present

## 2023-08-23 LAB — RAD ONC ARIA SESSION SUMMARY
Course Elapsed Days: 2
Plan Fractions Treated to Date: 3
Plan Prescribed Dose Per Fraction: 2.5 Gy
Plan Total Fractions Prescribed: 20
Plan Total Prescribed Dose: 50 Gy
Reference Point Dosage Given to Date: 7.5 Gy
Reference Point Session Dosage Given: 2.5 Gy
Session Number: 3

## 2023-08-24 DIAGNOSIS — N186 End stage renal disease: Secondary | ICD-10-CM | POA: Diagnosis not present

## 2023-08-24 DIAGNOSIS — Z992 Dependence on renal dialysis: Secondary | ICD-10-CM | POA: Diagnosis not present

## 2023-08-25 ENCOUNTER — Ambulatory Visit: Payer: Medicare Other

## 2023-08-25 DIAGNOSIS — Z992 Dependence on renal dialysis: Secondary | ICD-10-CM | POA: Diagnosis not present

## 2023-08-25 DIAGNOSIS — N186 End stage renal disease: Secondary | ICD-10-CM | POA: Diagnosis not present

## 2023-08-26 ENCOUNTER — Ambulatory Visit
Admission: RE | Admit: 2023-08-26 | Discharge: 2023-08-26 | Disposition: A | Discharge status: Home / Self Care | Payer: Medicare Other | Source: Ambulatory Visit | Attending: Radiation Oncology | Admitting: Radiation Oncology

## 2023-08-26 ENCOUNTER — Inpatient Hospital Stay: Payer: Medicare Other | Attending: Radiation Oncology

## 2023-08-26 ENCOUNTER — Ambulatory Visit
Admission: RE | Admit: 2023-08-26 | Discharge: 2023-08-26 | Discharge status: Home / Self Care | Payer: Medicare Other | Source: Ambulatory Visit | Attending: Radiation Oncology

## 2023-08-26 ENCOUNTER — Other Ambulatory Visit: Payer: Self-pay

## 2023-08-26 DIAGNOSIS — Z992 Dependence on renal dialysis: Secondary | ICD-10-CM | POA: Diagnosis not present

## 2023-08-26 DIAGNOSIS — C07 Malignant neoplasm of parotid gland: Secondary | ICD-10-CM

## 2023-08-26 DIAGNOSIS — N186 End stage renal disease: Secondary | ICD-10-CM | POA: Diagnosis not present

## 2023-08-26 DIAGNOSIS — Z87891 Personal history of nicotine dependence: Secondary | ICD-10-CM | POA: Diagnosis not present

## 2023-08-26 DIAGNOSIS — Z510 Encounter for antineoplastic radiation therapy: Secondary | ICD-10-CM | POA: Diagnosis not present

## 2023-08-26 LAB — RAD ONC ARIA SESSION SUMMARY
Course Elapsed Days: 5
Plan Fractions Treated to Date: 4
Plan Prescribed Dose Per Fraction: 2.5 Gy
Plan Total Fractions Prescribed: 20
Plan Total Prescribed Dose: 50 Gy
Reference Point Dosage Given to Date: 10 Gy
Reference Point Session Dosage Given: 2.5 Gy
Session Number: 4

## 2023-08-26 MED ORDER — SONAFINE EX EMUL
1.0000 | Freq: Two times a day (BID) | CUTANEOUS | Status: DC
  Administered 2023-08-26: 1 via TOPICAL

## 2023-08-26 NOTE — Progress Notes (Signed)
[facility] Clinical Social Work  Initial Assessment   Page Bradley Black is a 77 y.o. year old male contacted by phone. Clinical Social Work was referred by nurse navigator for assessment of psychosocial needs.   SDOH (Social Determinants of Health) assessments performed: No SDOH Interventions    Flowsheet Row CONSULT from 08/06/2023 in [facility] Radiation Oncology Clinical Support from 12/06/2022 in [facility] Clinical Support from 11/30/2021 in [facility] Chronic Care Management from 03/20/2021 in [facility] Chronic Care Management from 01/09/2021 in [facility] Chronic Care Management from 11/24/2020 in [facility]  SDOH Interventions        Food Insecurity Interventions Intervention Not Indicated Intervention Not Indicated Intervention Not Indicated -- -- --  Housing Interventions Intervention Not Indicated Intervention Not Indicated Intervention Not Indicated -- -- --  Transportation Interventions Intervention Not Indicated Intervention Not Indicated Intervention Not Indicated -- -- --  Utilities Interventions -- Intervention Not Indicated -- -- -- --  Alcohol Usage Interventions -- Intervention Not Indicated (Score <7) -- -- -- --  Depression Interventions/Treatment  -- -- -- Medication Medication Medication, Counseling  Financial Strain Interventions -- Intervention Not Indicated Intervention Not Indicated -- -- --  Physical Activity Interventions -- Intervention Not Indicated Intervention Not Indicated -- -- --  Stress Interventions -- Intervention Not Indicated Intervention Not Indicated -- -- --  Social Connections Interventions -- Intervention Not Indicated Intervention Not Indicated -- -- --       SDOH Screenings   Food Insecurity: No Food Insecurity (08/06/2023)  Housing: Low Risk  (08/06/2023)  Transportation Needs: No Transportation Needs (08/06/2023)  Utilities: Low Risk   (07/04/2023)   Received from [facility]  Alcohol Screen: Low Risk  (12/06/2022)  Depression (PHQ2-9): High Risk (08/06/2023)  Financial Resource Strain: Low Risk  (12/06/2022)  Physical Activity: Insufficiently Active (12/06/2022)  Social Connections: Moderately Integrated (12/06/2022)  Stress: No Stress Concern Present (12/06/2022)  Tobacco Use: Medium Risk (08/22/2023)     Distress Screen completed: No     No data to display            Family/Social Information:  Housing Arrangement: patient lives with his spouse .  Family members/support persons in your life? 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  Income source: Special educational needs teacher Income Financial concerns: No Type of concern: None Food access concerns: no Religious or spiritual practice:  patient reported that he is somewhat Banker Currently in place:  Family, Community education officer, Arts administrator, income  Coping/ Adjustment to diagnosis: Patient understands treatment plan and what happens next? yes Concerns about diagnosis and/or treatment:  Adjustment to Diagnosis  Patient reported stressors: Adjusting to my illness - CSW discussed SDOH screening for depression. Patient reported managing the adjustment to changes in his abilities (fatigue) no si/hi Patient enjoys being outside Current coping skills/ strengths: Ability for insight , Average or above average intelligence , Capable of independent living , Communication skills , Financial means , General fund of knowledge , Motivation for treatment/growth , Special hobby/interest , and Supportive family/friends     SUMMARY: Current SDOH Barriers:  No SDOH Barriers identified at this time  Clinical Social Work Clinical Goal(s):  No clinical social work goals at this time  Interventions: Discussed common feeling and emotions when being diagnosed with cancer, and the importance of support during  treatment Informed patient of the support team  roles and support services at [facility] Provided CSW contact information and encouraged patient to call with any questions or concerns    Follow Up Plan: Patient will contact CSW with any support or resource needs Patient verbalizes understanding of plan: Yes    Marguerita Merles, LCSWA Clinical Social Worker [facility]

## 2023-08-27 ENCOUNTER — Ambulatory Visit
Admission: RE | Admit: 2023-08-27 | Discharge: 2023-08-27 | Disposition: A | Discharge status: Home / Self Care | Payer: Medicare Other | Source: Ambulatory Visit | Attending: Radiation Oncology

## 2023-08-27 ENCOUNTER — Other Ambulatory Visit: Payer: Self-pay

## 2023-08-27 DIAGNOSIS — C07 Malignant neoplasm of parotid gland: Secondary | ICD-10-CM | POA: Diagnosis not present

## 2023-08-27 DIAGNOSIS — Z87891 Personal history of nicotine dependence: Secondary | ICD-10-CM | POA: Diagnosis not present

## 2023-08-27 DIAGNOSIS — Z510 Encounter for antineoplastic radiation therapy: Secondary | ICD-10-CM | POA: Diagnosis not present

## 2023-08-27 DIAGNOSIS — Z992 Dependence on renal dialysis: Secondary | ICD-10-CM | POA: Diagnosis not present

## 2023-08-27 DIAGNOSIS — N186 End stage renal disease: Secondary | ICD-10-CM | POA: Diagnosis not present

## 2023-08-27 LAB — RAD ONC ARIA SESSION SUMMARY
Course Elapsed Days: 6
Plan Fractions Treated to Date: 5
Plan Prescribed Dose Per Fraction: 2.5 Gy
Plan Total Fractions Prescribed: 20
Plan Total Prescribed Dose: 50 Gy
Reference Point Dosage Given to Date: 12.5 Gy
Reference Point Session Dosage Given: 2.5 Gy
Session Number: 5

## 2023-08-28 ENCOUNTER — Other Ambulatory Visit: Payer: Self-pay

## 2023-08-28 ENCOUNTER — Ambulatory Visit
Admission: RE | Admit: 2023-08-28 | Discharge: 2023-08-28 | Disposition: A | Discharge status: Home / Self Care | Payer: Medicare Other | Source: Ambulatory Visit | Attending: Radiation Oncology

## 2023-08-28 ENCOUNTER — Inpatient Hospital Stay: Payer: Medicare Other | Admitting: Dietician

## 2023-08-28 DIAGNOSIS — N186 End stage renal disease: Secondary | ICD-10-CM | POA: Diagnosis not present

## 2023-08-28 DIAGNOSIS — Z87891 Personal history of nicotine dependence: Secondary | ICD-10-CM | POA: Diagnosis not present

## 2023-08-28 DIAGNOSIS — Z992 Dependence on renal dialysis: Secondary | ICD-10-CM | POA: Diagnosis not present

## 2023-08-28 DIAGNOSIS — C07 Malignant neoplasm of parotid gland: Secondary | ICD-10-CM | POA: Diagnosis not present

## 2023-08-28 DIAGNOSIS — Z510 Encounter for antineoplastic radiation therapy: Secondary | ICD-10-CM | POA: Diagnosis not present

## 2023-08-28 LAB — RAD ONC ARIA SESSION SUMMARY
Course Elapsed Days: 7
Plan Fractions Treated to Date: 6
Plan Prescribed Dose Per Fraction: 2.5 Gy
Plan Total Fractions Prescribed: 20
Plan Total Prescribed Dose: 50 Gy
Reference Point Dosage Given to Date: 15 Gy
Reference Point Session Dosage Given: 2.5 Gy
Session Number: 6

## 2023-08-29 DIAGNOSIS — Z992 Dependence on renal dialysis: Secondary | ICD-10-CM | POA: Diagnosis not present

## 2023-08-29 DIAGNOSIS — N186 End stage renal disease: Secondary | ICD-10-CM | POA: Diagnosis not present

## 2023-08-30 DIAGNOSIS — Z992 Dependence on renal dialysis: Secondary | ICD-10-CM | POA: Diagnosis not present

## 2023-08-30 DIAGNOSIS — N186 End stage renal disease: Secondary | ICD-10-CM | POA: Diagnosis not present

## 2023-08-31 DIAGNOSIS — N186 End stage renal disease: Secondary | ICD-10-CM | POA: Diagnosis not present

## 2023-08-31 DIAGNOSIS — Z992 Dependence on renal dialysis: Secondary | ICD-10-CM | POA: Diagnosis not present

## 2023-09-01 DIAGNOSIS — N186 End stage renal disease: Secondary | ICD-10-CM | POA: Diagnosis not present

## 2023-09-01 DIAGNOSIS — Z992 Dependence on renal dialysis: Secondary | ICD-10-CM | POA: Diagnosis not present

## 2023-09-01 DIAGNOSIS — C801 Malignant (primary) neoplasm, unspecified: Secondary | ICD-10-CM

## 2023-09-01 HISTORY — DX: Malignant (primary) neoplasm, unspecified: C80.1

## 2023-09-02 ENCOUNTER — Ambulatory Visit: Payer: Medicare Other

## 2023-09-02 ENCOUNTER — Ambulatory Visit
Admission: RE | Admit: 2023-09-02 | Discharge: 2023-09-02 | Disposition: A | Discharge status: Home / Self Care | Payer: Medicare Other | Source: Ambulatory Visit | Attending: Radiation Oncology | Admitting: Radiation Oncology

## 2023-09-02 ENCOUNTER — Other Ambulatory Visit: Payer: Self-pay

## 2023-09-02 DIAGNOSIS — Z87891 Personal history of nicotine dependence: Secondary | ICD-10-CM | POA: Insufficient documentation

## 2023-09-02 DIAGNOSIS — C07 Malignant neoplasm of parotid gland: Secondary | ICD-10-CM | POA: Insufficient documentation

## 2023-09-02 DIAGNOSIS — Z510 Encounter for antineoplastic radiation therapy: Secondary | ICD-10-CM | POA: Diagnosis not present

## 2023-09-02 DIAGNOSIS — Z992 Dependence on renal dialysis: Secondary | ICD-10-CM | POA: Diagnosis not present

## 2023-09-02 DIAGNOSIS — N186 End stage renal disease: Secondary | ICD-10-CM | POA: Diagnosis not present

## 2023-09-02 LAB — RAD ONC ARIA SESSION SUMMARY
Course Elapsed Days: 12
Plan Fractions Treated to Date: 7
Plan Prescribed Dose Per Fraction: 2.5 Gy
Plan Total Fractions Prescribed: 20
Plan Total Prescribed Dose: 50 Gy
Reference Point Dosage Given to Date: 17.5 Gy
Reference Point Session Dosage Given: 2.5 Gy
Session Number: 7

## 2023-09-03 ENCOUNTER — Ambulatory Visit: Payer: Medicare Other

## 2023-09-03 ENCOUNTER — Other Ambulatory Visit: Payer: Self-pay

## 2023-09-03 ENCOUNTER — Ambulatory Visit
Admission: RE | Admit: 2023-09-03 | Discharge: 2023-09-03 | Disposition: A | Discharge status: Home / Self Care | Payer: Medicare Other | Source: Ambulatory Visit | Attending: Radiation Oncology

## 2023-09-03 DIAGNOSIS — C07 Malignant neoplasm of parotid gland: Secondary | ICD-10-CM | POA: Diagnosis not present

## 2023-09-03 DIAGNOSIS — Z510 Encounter for antineoplastic radiation therapy: Secondary | ICD-10-CM | POA: Diagnosis not present

## 2023-09-03 DIAGNOSIS — N186 End stage renal disease: Secondary | ICD-10-CM | POA: Diagnosis not present

## 2023-09-03 DIAGNOSIS — Z992 Dependence on renal dialysis: Secondary | ICD-10-CM | POA: Diagnosis not present

## 2023-09-03 DIAGNOSIS — Z87891 Personal history of nicotine dependence: Secondary | ICD-10-CM | POA: Diagnosis not present

## 2023-09-03 LAB — RAD ONC ARIA SESSION SUMMARY
Course Elapsed Days: 13
Plan Fractions Treated to Date: 8
Plan Prescribed Dose Per Fraction: 2.5 Gy
Plan Total Fractions Prescribed: 20
Plan Total Prescribed Dose: 50 Gy
Reference Point Dosage Given to Date: 20 Gy
Reference Point Session Dosage Given: 2.5 Gy
Session Number: 8

## 2023-09-04 ENCOUNTER — Other Ambulatory Visit: Payer: Self-pay

## 2023-09-04 ENCOUNTER — Inpatient Hospital Stay: Payer: Medicare Other | Attending: Radiation Oncology | Admitting: Dietician

## 2023-09-04 ENCOUNTER — Ambulatory Visit
Admission: RE | Admit: 2023-09-04 | Discharge: 2023-09-04 | Disposition: A | Discharge status: Home / Self Care | Payer: Medicare Other | Source: Ambulatory Visit | Attending: Radiation Oncology

## 2023-09-04 DIAGNOSIS — Z992 Dependence on renal dialysis: Secondary | ICD-10-CM | POA: Diagnosis not present

## 2023-09-04 DIAGNOSIS — C07 Malignant neoplasm of parotid gland: Secondary | ICD-10-CM | POA: Diagnosis not present

## 2023-09-04 DIAGNOSIS — Z87891 Personal history of nicotine dependence: Secondary | ICD-10-CM | POA: Diagnosis not present

## 2023-09-04 DIAGNOSIS — Z510 Encounter for antineoplastic radiation therapy: Secondary | ICD-10-CM | POA: Diagnosis not present

## 2023-09-04 DIAGNOSIS — N186 End stage renal disease: Secondary | ICD-10-CM | POA: Diagnosis not present

## 2023-09-04 LAB — RAD ONC ARIA SESSION SUMMARY
Course Elapsed Days: 14
Plan Fractions Treated to Date: 9
Plan Prescribed Dose Per Fraction: 2.5 Gy
Plan Total Fractions Prescribed: 20
Plan Total Prescribed Dose: 50 Gy
Reference Point Dosage Given to Date: 22.5 Gy
Reference Point Session Dosage Given: 2.5 Gy
Session Number: 9

## 2023-09-04 NOTE — Progress Notes (Signed)
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 S/p right radical parotidectomy ([facility]). He is receiving 4 weeks of hypo-fractionated radiation therapy under the care of Dr. Basilio Cairo  Past medical history includes BPH, CAD, MI, CABG x4, GERD, hypertensive retinopathy, ischemic cardiomyopathy, HLD, ESRD on PD  Patient arrived 30 minutes late for appointment. He was never arrived and was told to wait in RT waiting room. Patient agreeable to stay for visit. He reports dry mouth and thick saliva. Patient reports off taste of foods. This began yesterday at breakfast. Patient says his appetite isn't too bad. Recalls bowl of cereal and spaghetti yesterday. He has drank oral nutrition supplements in the past. These are filling and reports bloating after consuming. He drinks 2 bottles of water on a good day. Patient is on nightly peritoneal dialysis for ESRD and on a fluid restriction, but unsure what this is. Patient is seen 2x/month by nephrology.   Nutrition Focused Physical Exam: deferred  Medications: lipitor, D3, plavix, flexeril, cardura, ferrous sulfate, imdur, synthroid, IDA, rena-vit, protonix, entresto, aldactone, demadex  Labs: no recent labs for review  Anthropometrics:   Height: 5'8" Weight: 140 lb 6.4 oz (11/5) UBW: Pt unsure of dry wt  BMI: 21.35   NUTRITION DIAGNOSIS: Food and nutrition related knowledge deficit related to [facility] as evidenced by no prior need for associated nutrition information    INTERVENTION:  Educated on increased calorie and protein energy needs to maintain weights/strength during treatment Encouraged small frequent meals/snacks vs 2 meals - snack ideas provided Discussed soft and moist high protein foods for ease of intake Discussed strategies for altered taste, continue baking soda salt water rinses and suggested trying before meals Suggested trying CIB powder mixed  with milk for alternate supplement idea as well as trying half of Ensure in between meals - samples provided RD unable to find recent nephrology documentation per Care Everywhere - will obtain contact information from pt at next visit   MONITORING, EVALUATION, GOAL: Patient will tolerate adequate calories and protein to minimize wt loss during treatment    Next Visit: Wednesday December 11 after RT (pt aware)

## 2023-09-05 ENCOUNTER — Other Ambulatory Visit: Payer: Self-pay

## 2023-09-05 ENCOUNTER — Ambulatory Visit
Admission: RE | Admit: 2023-09-05 | Discharge: 2023-09-05 | Disposition: A | Discharge status: Home / Self Care | Payer: Medicare Other | Source: Ambulatory Visit | Attending: Radiation Oncology | Admitting: Radiation Oncology

## 2023-09-05 DIAGNOSIS — C07 Malignant neoplasm of parotid gland: Secondary | ICD-10-CM | POA: Diagnosis not present

## 2023-09-05 DIAGNOSIS — Z510 Encounter for antineoplastic radiation therapy: Secondary | ICD-10-CM | POA: Diagnosis not present

## 2023-09-05 DIAGNOSIS — Z992 Dependence on renal dialysis: Secondary | ICD-10-CM | POA: Diagnosis not present

## 2023-09-05 DIAGNOSIS — N186 End stage renal disease: Secondary | ICD-10-CM | POA: Diagnosis not present

## 2023-09-05 DIAGNOSIS — Z87891 Personal history of nicotine dependence: Secondary | ICD-10-CM | POA: Diagnosis not present

## 2023-09-05 LAB — RAD ONC ARIA SESSION SUMMARY
Course Elapsed Days: 15
Plan Fractions Treated to Date: 10
Plan Prescribed Dose Per Fraction: 2.5 Gy
Plan Total Fractions Prescribed: 20
Plan Total Prescribed Dose: 50 Gy
Reference Point Dosage Given to Date: 25 Gy
Reference Point Session Dosage Given: 2.5 Gy
Session Number: 10

## 2023-09-06 ENCOUNTER — Other Ambulatory Visit: Payer: Self-pay

## 2023-09-06 ENCOUNTER — Ambulatory Visit
Admission: RE | Admit: 2023-09-06 | Discharge: 2023-09-06 | Disposition: A | Discharge status: Home / Self Care | Payer: Medicare Other | Source: Ambulatory Visit | Attending: Radiation Oncology

## 2023-09-06 DIAGNOSIS — N186 End stage renal disease: Secondary | ICD-10-CM | POA: Diagnosis not present

## 2023-09-06 DIAGNOSIS — Z992 Dependence on renal dialysis: Secondary | ICD-10-CM | POA: Diagnosis not present

## 2023-09-06 DIAGNOSIS — C07 Malignant neoplasm of parotid gland: Secondary | ICD-10-CM | POA: Diagnosis not present

## 2023-09-06 DIAGNOSIS — Z87891 Personal history of nicotine dependence: Secondary | ICD-10-CM | POA: Diagnosis not present

## 2023-09-06 DIAGNOSIS — Z510 Encounter for antineoplastic radiation therapy: Secondary | ICD-10-CM | POA: Diagnosis not present

## 2023-09-06 LAB — RAD ONC ARIA SESSION SUMMARY
Course Elapsed Days: 16
Plan Fractions Treated to Date: 11
Plan Prescribed Dose Per Fraction: 2.5 Gy
Plan Total Fractions Prescribed: 20
Plan Total Prescribed Dose: 50 Gy
Reference Point Dosage Given to Date: 27.5 Gy
Reference Point Session Dosage Given: 2.5 Gy
Session Number: 11

## 2023-09-07 DIAGNOSIS — N186 End stage renal disease: Secondary | ICD-10-CM | POA: Diagnosis not present

## 2023-09-07 DIAGNOSIS — Z992 Dependence on renal dialysis: Secondary | ICD-10-CM | POA: Diagnosis not present

## 2023-09-08 DIAGNOSIS — N186 End stage renal disease: Secondary | ICD-10-CM | POA: Diagnosis not present

## 2023-09-08 DIAGNOSIS — Z992 Dependence on renal dialysis: Secondary | ICD-10-CM | POA: Diagnosis not present

## 2023-09-09 ENCOUNTER — Other Ambulatory Visit: Payer: Self-pay

## 2023-09-09 ENCOUNTER — Ambulatory Visit
Admission: RE | Admit: 2023-09-09 | Discharge: 2023-09-09 | Disposition: A | Discharge status: Home / Self Care | Payer: Medicare Other | Source: Ambulatory Visit | Attending: Radiation Oncology

## 2023-09-09 ENCOUNTER — Ambulatory Visit: Payer: Medicare Other

## 2023-09-09 DIAGNOSIS — N186 End stage renal disease: Secondary | ICD-10-CM | POA: Diagnosis not present

## 2023-09-09 DIAGNOSIS — Z510 Encounter for antineoplastic radiation therapy: Secondary | ICD-10-CM | POA: Diagnosis not present

## 2023-09-09 DIAGNOSIS — Z992 Dependence on renal dialysis: Secondary | ICD-10-CM | POA: Diagnosis not present

## 2023-09-09 DIAGNOSIS — C07 Malignant neoplasm of parotid gland: Secondary | ICD-10-CM | POA: Diagnosis not present

## 2023-09-09 DIAGNOSIS — Z87891 Personal history of nicotine dependence: Secondary | ICD-10-CM | POA: Diagnosis not present

## 2023-09-09 LAB — RAD ONC ARIA SESSION SUMMARY
Course Elapsed Days: 19
Plan Fractions Treated to Date: 12
Plan Prescribed Dose Per Fraction: 2.5 Gy
Plan Total Fractions Prescribed: 20
Plan Total Prescribed Dose: 50 Gy
Reference Point Dosage Given to Date: 30 Gy
Reference Point Session Dosage Given: 2.5 Gy
Session Number: 12

## 2023-09-10 ENCOUNTER — Other Ambulatory Visit: Payer: Self-pay

## 2023-09-10 ENCOUNTER — Ambulatory Visit
Admission: RE | Admit: 2023-09-10 | Discharge: 2023-09-10 | Disposition: A | Discharge status: Home / Self Care | Payer: Medicare Other | Source: Ambulatory Visit | Attending: Radiation Oncology | Admitting: Radiation Oncology

## 2023-09-10 DIAGNOSIS — Z510 Encounter for antineoplastic radiation therapy: Secondary | ICD-10-CM | POA: Diagnosis not present

## 2023-09-10 DIAGNOSIS — C07 Malignant neoplasm of parotid gland: Secondary | ICD-10-CM | POA: Diagnosis not present

## 2023-09-10 DIAGNOSIS — N186 End stage renal disease: Secondary | ICD-10-CM | POA: Diagnosis not present

## 2023-09-10 DIAGNOSIS — Z87891 Personal history of nicotine dependence: Secondary | ICD-10-CM | POA: Diagnosis not present

## 2023-09-10 DIAGNOSIS — Z992 Dependence on renal dialysis: Secondary | ICD-10-CM | POA: Diagnosis not present

## 2023-09-10 LAB — RAD ONC ARIA SESSION SUMMARY
Course Elapsed Days: 20
Plan Fractions Treated to Date: 13
Plan Prescribed Dose Per Fraction: 2.5 Gy
Plan Total Fractions Prescribed: 20
Plan Total Prescribed Dose: 50 Gy
Reference Point Dosage Given to Date: 32.5 Gy
Reference Point Session Dosage Given: 2.5 Gy
Session Number: 13

## 2023-09-11 ENCOUNTER — Inpatient Hospital Stay: Payer: Medicare Other | Admitting: Dietician

## 2023-09-11 ENCOUNTER — Other Ambulatory Visit: Payer: Self-pay

## 2023-09-11 ENCOUNTER — Ambulatory Visit
Admission: RE | Admit: 2023-09-11 | Discharge: 2023-09-11 | Disposition: A | Discharge status: Home / Self Care | Payer: Medicare Other | Source: Ambulatory Visit | Attending: Radiation Oncology | Admitting: Radiation Oncology

## 2023-09-11 DIAGNOSIS — C07 Malignant neoplasm of parotid gland: Secondary | ICD-10-CM | POA: Diagnosis not present

## 2023-09-11 DIAGNOSIS — Z510 Encounter for antineoplastic radiation therapy: Secondary | ICD-10-CM | POA: Diagnosis not present

## 2023-09-11 DIAGNOSIS — Z992 Dependence on renal dialysis: Secondary | ICD-10-CM | POA: Diagnosis not present

## 2023-09-11 DIAGNOSIS — N186 End stage renal disease: Secondary | ICD-10-CM | POA: Diagnosis not present

## 2023-09-11 DIAGNOSIS — Z87891 Personal history of nicotine dependence: Secondary | ICD-10-CM | POA: Diagnosis not present

## 2023-09-11 LAB — RAD ONC ARIA SESSION SUMMARY
Course Elapsed Days: 21
Plan Fractions Treated to Date: 14
Plan Prescribed Dose Per Fraction: 2.5 Gy
Plan Total Fractions Prescribed: 20
Plan Total Prescribed Dose: 50 Gy
Reference Point Dosage Given to Date: 35 Gy
Reference Point Session Dosage Given: 2.5 Gy
Session Number: 14

## 2023-09-11 NOTE — Progress Notes (Signed)
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 S/p right radical parotidectomy ([facility]). He is receiving 4 weeks of hypo-fractionated radiation therapy. Patient has completed 14/20 planned fractions.  Met with patient following radiation. He reports tolerating treatment well overall. His mouth is dry. Patient reports diminished taste of foods. He denies sore throat or difficulty with swallowing. Recalls bowl of cereal, toast/honey, coffee for breakfast, red hot sausage dog, chips for lunch. Patient had BBQ chicken with 2 vegetables for dinner. He is drinking 2 bottles of water. Patient tried ONS samples. He likes the CIB/milk more than Boost/Ensure. He is drinking 1-2 daily. He denies nausea, vomiting, diarrhea. Patient has chronic constipation and on bowel regimen. Last bowel movement was yesterday 12/10.    Medications: reviewed   Labs: no new labs  Anthropometrics: Wt 143.4 on 12/9 (aria) - increased   12/3 - 141 lb  NUTRITION DIAGNOSIS: Food and nutrition related knowledge deficit improving     INTERVENTION:  Continue regular diet as tolerated. Encouraged high calorie high protein foods Continue CIB mixed with one cup milk BID    MONITORING, EVALUATION, GOAL: wt trends, intake   NEXT VISIT: Wednesday December 18 after therapy

## 2023-09-12 ENCOUNTER — Other Ambulatory Visit: Payer: Self-pay

## 2023-09-12 ENCOUNTER — Ambulatory Visit
Admission: RE | Admit: 2023-09-12 | Discharge: 2023-09-12 | Disposition: A | Discharge status: Home / Self Care | Payer: Medicare Other | Source: Ambulatory Visit | Attending: Radiation Oncology | Admitting: Radiation Oncology

## 2023-09-12 DIAGNOSIS — Z510 Encounter for antineoplastic radiation therapy: Secondary | ICD-10-CM | POA: Diagnosis not present

## 2023-09-12 DIAGNOSIS — C07 Malignant neoplasm of parotid gland: Secondary | ICD-10-CM | POA: Diagnosis not present

## 2023-09-12 DIAGNOSIS — Z992 Dependence on renal dialysis: Secondary | ICD-10-CM | POA: Diagnosis not present

## 2023-09-12 DIAGNOSIS — Z87891 Personal history of nicotine dependence: Secondary | ICD-10-CM | POA: Diagnosis not present

## 2023-09-12 DIAGNOSIS — N186 End stage renal disease: Secondary | ICD-10-CM | POA: Diagnosis not present

## 2023-09-12 LAB — RAD ONC ARIA SESSION SUMMARY
Course Elapsed Days: 22
Plan Fractions Treated to Date: 15
Plan Prescribed Dose Per Fraction: 2.5 Gy
Plan Total Fractions Prescribed: 20
Plan Total Prescribed Dose: 50 Gy
Reference Point Dosage Given to Date: 37.5 Gy
Reference Point Session Dosage Given: 2.5 Gy
Session Number: 15

## 2023-09-13 ENCOUNTER — Other Ambulatory Visit: Payer: Self-pay

## 2023-09-13 ENCOUNTER — Ambulatory Visit
Admission: RE | Admit: 2023-09-13 | Discharge: 2023-09-13 | Disposition: A | Discharge status: Home / Self Care | Payer: Medicare Other | Source: Ambulatory Visit | Attending: Radiation Oncology

## 2023-09-13 DIAGNOSIS — Z992 Dependence on renal dialysis: Secondary | ICD-10-CM | POA: Diagnosis not present

## 2023-09-13 DIAGNOSIS — Z87891 Personal history of nicotine dependence: Secondary | ICD-10-CM | POA: Diagnosis not present

## 2023-09-13 DIAGNOSIS — C07 Malignant neoplasm of parotid gland: Secondary | ICD-10-CM | POA: Diagnosis not present

## 2023-09-13 DIAGNOSIS — Z510 Encounter for antineoplastic radiation therapy: Secondary | ICD-10-CM | POA: Diagnosis not present

## 2023-09-13 DIAGNOSIS — N186 End stage renal disease: Secondary | ICD-10-CM | POA: Diagnosis not present

## 2023-09-13 LAB — RAD ONC ARIA SESSION SUMMARY
Course Elapsed Days: 23
Plan Fractions Treated to Date: 16
Plan Prescribed Dose Per Fraction: 2.5 Gy
Plan Total Fractions Prescribed: 20
Plan Total Prescribed Dose: 50 Gy
Reference Point Dosage Given to Date: 40 Gy
Reference Point Session Dosage Given: 2.5 Gy
Session Number: 16

## 2023-09-14 DIAGNOSIS — Z992 Dependence on renal dialysis: Secondary | ICD-10-CM | POA: Diagnosis not present

## 2023-09-14 DIAGNOSIS — N186 End stage renal disease: Secondary | ICD-10-CM | POA: Diagnosis not present

## 2023-09-15 DIAGNOSIS — N186 End stage renal disease: Secondary | ICD-10-CM | POA: Diagnosis not present

## 2023-09-15 DIAGNOSIS — Z992 Dependence on renal dialysis: Secondary | ICD-10-CM | POA: Diagnosis not present

## 2023-09-16 ENCOUNTER — Ambulatory Visit: Payer: Medicare Other

## 2023-09-16 ENCOUNTER — Encounter (INDEPENDENT_AMBULATORY_CARE_PROVIDER_SITE_OTHER): Payer: Medicare Other | Admitting: Ophthalmology

## 2023-09-16 ENCOUNTER — Ambulatory Visit
Admission: RE | Admit: 2023-09-16 | Discharge: 2023-09-16 | Disposition: A | Discharge status: Home / Self Care | Payer: Medicare Other | Source: Ambulatory Visit | Attending: Radiation Oncology

## 2023-09-16 ENCOUNTER — Other Ambulatory Visit: Payer: Self-pay

## 2023-09-16 DIAGNOSIS — H43813 Vitreous degeneration, bilateral: Secondary | ICD-10-CM

## 2023-09-16 DIAGNOSIS — Z87891 Personal history of nicotine dependence: Secondary | ICD-10-CM | POA: Diagnosis not present

## 2023-09-16 DIAGNOSIS — I10 Essential (primary) hypertension: Secondary | ICD-10-CM | POA: Diagnosis not present

## 2023-09-16 DIAGNOSIS — Z992 Dependence on renal dialysis: Secondary | ICD-10-CM | POA: Diagnosis not present

## 2023-09-16 DIAGNOSIS — H59033 Cystoid macular edema following cataract surgery, bilateral: Secondary | ICD-10-CM | POA: Diagnosis not present

## 2023-09-16 DIAGNOSIS — H35033 Hypertensive retinopathy, bilateral: Secondary | ICD-10-CM | POA: Diagnosis not present

## 2023-09-16 DIAGNOSIS — Z510 Encounter for antineoplastic radiation therapy: Secondary | ICD-10-CM | POA: Diagnosis not present

## 2023-09-16 DIAGNOSIS — H35073 Retinal telangiectasis, bilateral: Secondary | ICD-10-CM | POA: Diagnosis not present

## 2023-09-16 DIAGNOSIS — C07 Malignant neoplasm of parotid gland: Secondary | ICD-10-CM | POA: Diagnosis not present

## 2023-09-16 DIAGNOSIS — N186 End stage renal disease: Secondary | ICD-10-CM | POA: Diagnosis not present

## 2023-09-16 LAB — RAD ONC ARIA SESSION SUMMARY
Course Elapsed Days: 26
Plan Fractions Treated to Date: 17
Plan Prescribed Dose Per Fraction: 2.5 Gy
Plan Total Fractions Prescribed: 20
Plan Total Prescribed Dose: 50 Gy
Reference Point Dosage Given to Date: 42.5 Gy
Reference Point Session Dosage Given: 2.5 Gy
Session Number: 17

## 2023-09-17 ENCOUNTER — Other Ambulatory Visit: Payer: Self-pay

## 2023-09-17 ENCOUNTER — Ambulatory Visit
Admission: RE | Admit: 2023-09-17 | Discharge: 2023-09-17 | Disposition: A | Discharge status: Home / Self Care | Payer: Medicare Other | Source: Ambulatory Visit | Attending: Radiation Oncology

## 2023-09-17 DIAGNOSIS — Z992 Dependence on renal dialysis: Secondary | ICD-10-CM | POA: Diagnosis not present

## 2023-09-17 DIAGNOSIS — C07 Malignant neoplasm of parotid gland: Secondary | ICD-10-CM | POA: Diagnosis not present

## 2023-09-17 DIAGNOSIS — N186 End stage renal disease: Secondary | ICD-10-CM | POA: Diagnosis not present

## 2023-09-17 DIAGNOSIS — Z87891 Personal history of nicotine dependence: Secondary | ICD-10-CM | POA: Diagnosis not present

## 2023-09-17 DIAGNOSIS — Z510 Encounter for antineoplastic radiation therapy: Secondary | ICD-10-CM | POA: Diagnosis not present

## 2023-09-17 LAB — RAD ONC ARIA SESSION SUMMARY
Course Elapsed Days: 27
Plan Fractions Treated to Date: 18
Plan Prescribed Dose Per Fraction: 2.5 Gy
Plan Total Fractions Prescribed: 20
Plan Total Prescribed Dose: 50 Gy
Reference Point Dosage Given to Date: 45 Gy
Reference Point Session Dosage Given: 2.5 Gy
Session Number: 18

## 2023-09-18 ENCOUNTER — Inpatient Hospital Stay: Payer: Medicare Other | Admitting: Dietician

## 2023-09-18 ENCOUNTER — Other Ambulatory Visit: Payer: Self-pay

## 2023-09-18 ENCOUNTER — Ambulatory Visit: Payer: Medicare Other

## 2023-09-18 ENCOUNTER — Ambulatory Visit
Admission: RE | Admit: 2023-09-18 | Discharge: 2023-09-18 | Disposition: A | Discharge status: Home / Self Care | Payer: Medicare Other | Source: Ambulatory Visit | Attending: Radiation Oncology

## 2023-09-18 DIAGNOSIS — Z87891 Personal history of nicotine dependence: Secondary | ICD-10-CM | POA: Diagnosis not present

## 2023-09-18 DIAGNOSIS — N186 End stage renal disease: Secondary | ICD-10-CM | POA: Diagnosis not present

## 2023-09-18 DIAGNOSIS — Z992 Dependence on renal dialysis: Secondary | ICD-10-CM | POA: Diagnosis not present

## 2023-09-18 DIAGNOSIS — C07 Malignant neoplasm of parotid gland: Secondary | ICD-10-CM | POA: Diagnosis not present

## 2023-09-18 DIAGNOSIS — Z510 Encounter for antineoplastic radiation therapy: Secondary | ICD-10-CM | POA: Diagnosis not present

## 2023-09-18 LAB — RAD ONC ARIA SESSION SUMMARY
Course Elapsed Days: 28
Plan Fractions Treated to Date: 19
Plan Prescribed Dose Per Fraction: 2.5 Gy
Plan Total Fractions Prescribed: 20
Plan Total Prescribed Dose: 50 Gy
Reference Point Dosage Given to Date: 47.5 Gy
Reference Point Session Dosage Given: 2.5 Gy
Session Number: 19

## 2023-09-18 NOTE — Progress Notes (Signed)
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 S/p right radical parotidectomy ([facility]). He is receiving 4 weeks of hypo-fractionated radiation therapy. Patient has completed 19/20 planned fractions.   Met with patient following radiation in office. Patient is looking forward to completing treatment tomorrow. He has tolerated therapy well overall. His mouth is dry and has lost his taste. He denies sore throat/painful swallow. Patient has a good appetite and tolerating regular diet. Recalls oatmeal with brown sugar, milk, and toast/jelly for breakfast. Patient had a toasted cheese sandwich for lunch and chicken pie for dinner. He is drinking 2 CIB mixed with whole milk as well as 2 bottles of water.   Medications: reviewed   Labs: reviewed   Anthropometrics: Last wt (aria) 143.8 lb on 12/16 - stable  12/9 - 143.4 lb 12/3 - 141 lb    NUTRITION DIAGNOSIS: Food and nutrition related knowledge deficit improved    INTERVENTION:  Continue regular diet as tolerated. Encouraged high calorie/high protein foods for wt maintenance Continue baking soda salt water gargles Continue 2 CIB mixed with whole milk     MONITORING, EVALUATION, GOAL: wt trends, intake   NEXT VISIT: Tuesday January 14 in clinic

## 2023-09-19 ENCOUNTER — Ambulatory Visit: Payer: Medicare Other

## 2023-09-19 ENCOUNTER — Other Ambulatory Visit: Payer: Self-pay

## 2023-09-19 ENCOUNTER — Ambulatory Visit
Admission: RE | Admit: 2023-09-19 | Discharge: 2023-09-19 | Disposition: A | Discharge status: Home / Self Care | Payer: Medicare Other | Source: Ambulatory Visit | Attending: Radiation Oncology | Admitting: Radiation Oncology

## 2023-09-19 DIAGNOSIS — Z510 Encounter for antineoplastic radiation therapy: Secondary | ICD-10-CM | POA: Diagnosis not present

## 2023-09-19 DIAGNOSIS — Z87891 Personal history of nicotine dependence: Secondary | ICD-10-CM | POA: Diagnosis not present

## 2023-09-19 DIAGNOSIS — N186 End stage renal disease: Secondary | ICD-10-CM | POA: Diagnosis not present

## 2023-09-19 DIAGNOSIS — C07 Malignant neoplasm of parotid gland: Secondary | ICD-10-CM | POA: Diagnosis not present

## 2023-09-19 DIAGNOSIS — Z992 Dependence on renal dialysis: Secondary | ICD-10-CM | POA: Diagnosis not present

## 2023-09-19 LAB — RAD ONC ARIA SESSION SUMMARY
Course Elapsed Days: 29
Plan Fractions Treated to Date: 20
Plan Prescribed Dose Per Fraction: 2.5 Gy
Plan Total Fractions Prescribed: 20
Plan Total Prescribed Dose: 50 Gy
Reference Point Dosage Given to Date: 50 Gy
Reference Point Session Dosage Given: 2.5 Gy
Session Number: 20

## 2023-09-20 DIAGNOSIS — N186 End stage renal disease: Secondary | ICD-10-CM | POA: Diagnosis not present

## 2023-09-20 DIAGNOSIS — Z992 Dependence on renal dialysis: Secondary | ICD-10-CM | POA: Diagnosis not present

## 2023-09-20 NOTE — Radiation Completion Notes (Signed)
Patient Name: BRAXXTON, GANGE MRN: [id number] Date of Birth: 1946/01/24 Referring Physician: Scarlette Ar, M.D. Date of Service: 2023-09-20 Radiation Oncologist: Lonie Peak, M.D. [facility]                             RADIATION ONCOLOGY END OF TREATMENT NOTE     Diagnosis: C07 Malignant neoplasm of parotid gland Staging on 2023-08-06: Malignant neoplasm of parotid gland ([facility]) T=pT2, N=pN0, M=cM0 Intent: Curative     ==========DELIVERED PLANS==========  First Treatment Date: 2023-08-21 Last Treatment Date: 2023-09-19   Plan Name: HN_R_Parotid Site: Parotid, Right Technique: IMRT Mode: Photon Dose Per Fraction: 2.5 Gy Prescribed Dose (Delivered / Prescribed): 50 Gy / 50 Gy Prescribed Fxs (Delivered / Prescribed): 20 / 20     ==========ON TREATMENT VISIT DATES========== 2023-08-26, 2023-09-03, 2023-09-09, 2023-09-16     ==========UPCOMING VISITS==========       ==========APPENDIX - ON TREATMENT VISIT NOTES==========   See weekly On Treatment Notes in Epic for details in the Media tab (listed as Progress notes on the On Treatment Visit Dates listed above).

## 2023-09-21 DIAGNOSIS — N186 End stage renal disease: Secondary | ICD-10-CM | POA: Diagnosis not present

## 2023-09-21 DIAGNOSIS — Z992 Dependence on renal dialysis: Secondary | ICD-10-CM | POA: Diagnosis not present

## 2023-09-22 DIAGNOSIS — N186 End stage renal disease: Secondary | ICD-10-CM | POA: Diagnosis not present

## 2023-09-22 DIAGNOSIS — Z992 Dependence on renal dialysis: Secondary | ICD-10-CM | POA: Diagnosis not present

## 2023-09-23 DIAGNOSIS — Z992 Dependence on renal dialysis: Secondary | ICD-10-CM | POA: Diagnosis not present

## 2023-09-23 DIAGNOSIS — N186 End stage renal disease: Secondary | ICD-10-CM | POA: Diagnosis not present

## 2023-09-24 DIAGNOSIS — Z992 Dependence on renal dialysis: Secondary | ICD-10-CM | POA: Diagnosis not present

## 2023-09-24 DIAGNOSIS — N186 End stage renal disease: Secondary | ICD-10-CM | POA: Diagnosis not present

## 2023-09-25 DIAGNOSIS — Z992 Dependence on renal dialysis: Secondary | ICD-10-CM | POA: Diagnosis not present

## 2023-09-25 DIAGNOSIS — N186 End stage renal disease: Secondary | ICD-10-CM | POA: Diagnosis not present

## 2023-09-26 DIAGNOSIS — N186 End stage renal disease: Secondary | ICD-10-CM | POA: Diagnosis not present

## 2023-09-26 DIAGNOSIS — Z992 Dependence on renal dialysis: Secondary | ICD-10-CM | POA: Diagnosis not present

## 2023-09-27 ENCOUNTER — Ambulatory Visit: Payer: Self-pay | Admitting: Family Medicine

## 2023-09-27 ENCOUNTER — Ambulatory Visit
Admission: EM | Admit: 2023-09-27 | Discharge: 2023-09-27 | Disposition: A | Discharge status: Home / Self Care | Payer: Medicare Other | Attending: Family Medicine | Admitting: Family Medicine

## 2023-09-27 ENCOUNTER — Encounter: Payer: Self-pay | Admitting: Emergency Medicine

## 2023-09-27 DIAGNOSIS — Z992 Dependence on renal dialysis: Secondary | ICD-10-CM | POA: Diagnosis not present

## 2023-09-27 DIAGNOSIS — B029 Zoster without complications: Secondary | ICD-10-CM

## 2023-09-27 DIAGNOSIS — N186 End stage renal disease: Secondary | ICD-10-CM | POA: Diagnosis not present

## 2023-09-27 LAB — POCT URINALYSIS DIP (MANUAL ENTRY)
Blood, UA: NEGATIVE
Glucose, UA: 100 mg/dL — AB
Ketones, POC UA: NEGATIVE mg/dL
Leukocytes, UA: NEGATIVE
Nitrite, UA: NEGATIVE
Protein Ur, POC: 100 mg/dL — AB
Spec Grav, UA: 1.025 (ref 1.010–1.025)
Urobilinogen, UA: 0.2 U/dL
pH, UA: 5.5 (ref 5.0–8.0)

## 2023-09-27 MED ORDER — VALACYCLOVIR HCL 500 MG PO TABS: 500.0000 mg | ORAL_TABLET | Freq: Every day | ORAL | 0 refills | Status: AC

## 2023-09-27 NOTE — ED Triage Notes (Signed)
Left flank pain that radiated around to stomach x 2 days.  Last BM was Wednesday.  States area is tender to the touch.

## 2023-09-27 NOTE — Telephone Encounter (Signed)
Chief Complaint: LLQ abdominal pain Symptoms: LLQ abdominal pain wraps around toward left lower back Frequency: constant since yesterday Pertinent Negatives: Patient denies fever, nausea or vomiting, blood in stool, painful urination, diarrhea, syncope Disposition: [] ED /[x] Urgent Care (no appt availability in office) / [] Appointment(In office/virtual)/ []  [facility]/ [] Home Care/ [] Refused Recommended Disposition /[] [facility]/ []  Follow-up with PCP Additional Notes: Pt c/o gradual onset LLQ abdominal pain wraps around toward his back since yesterday. Pt states he has a history of hernias but states they have never cause pain and he is unsure where they were located. Pt denies hx of kidney stones. No available appts with PCP or at additional [facility] PCP locations. Pt agreeable to go to UC.   Copied from CRM [id number]. Topic: Clinical - Red Word Triage >> Sep 27, 2023  8:21 AM Prudencio Pair wrote: Red Word that prompted transfer to Nurse Triage: Patient states he is having pain in left side. States it started yesterday. Pain level is a 5 or 6/10. Wants to see if Dr. Tanya Nones has an opening for today. Reason for Disposition  [1] MILD-MODERATE pain AND [2] constant AND [3] present > 2 hours  Answer Assessment - Initial Assessment Questions 1. LOCATION: "Where does it hurt?"      Lower left abdomen.  2. RADIATION: "Does the pain shoot anywhere else?" (e.g., chest, back)     Radiates to the left side of the back.  3. ONSET: "When did the pain begin?" (Minutes, hours or days ago)      Yesterday; constant. Pt states it hurt all night and he wasn't able to sleep.  4. SUDDEN: "Gradual or sudden onset?"     Gradual.  5. PATTERN "Does the pain come and go, or is it constant?"    - If it comes and goes: "How long does it last?" "Do you have pain now?"     (Note: Comes and goes means the pain is intermittent. It goes away completely between bouts.)    - If constant: "Is it  getting better, staying the same, or getting worse?"      (Note: Constant means the pain never goes away completely; most serious pain is constant and gets worse.)      Constant. Sitting down pt states it is not as bad, but pt states when he gets up and walks it worsens.  6. SEVERITY: "How bad is the pain?"  (e.g., Scale 1-10; mild, moderate, or severe)    - MILD (1-3): Doesn't interfere with normal activities, abdomen soft and not tender to touch.     - MODERATE (4-7): Interferes with normal activities or awakens from sleep, abdomen tender to touch.     - SEVERE (8-10): Excruciating pain, doubled over, unable to do any normal activities.       5-6/10.   7. RECURRENT SYMPTOM: "Have you ever had this type of stomach pain before?" If Yes, ask: "When was the last time?" and "What happened that time?"      No.  8. CAUSE: "What do you think is causing the stomach pain?"     Unsure.  9. RELIEVING/AGGRAVATING FACTORS: "What makes it better or worse?" (e.g., antacids, bending or twisting motion, bowel movement)     Tylenol, pt states did not help. No change in pain with urination or having BM. Standing up and walking around, or lying on the left side worsen the pain.  10. OTHER SYMPTOMS: "Do you have any other symptoms?" (e.g., back  pain, diarrhea, fever, urination pain, vomiting)       Denies any additional symptoms.  Protocols used: Abdominal Pain - Male-A-AH

## 2023-09-28 DIAGNOSIS — N186 End stage renal disease: Secondary | ICD-10-CM | POA: Diagnosis not present

## 2023-09-28 DIAGNOSIS — Z992 Dependence on renal dialysis: Secondary | ICD-10-CM | POA: Diagnosis not present

## 2023-09-29 DIAGNOSIS — Z992 Dependence on renal dialysis: Secondary | ICD-10-CM | POA: Diagnosis not present

## 2023-09-29 DIAGNOSIS — N186 End stage renal disease: Secondary | ICD-10-CM | POA: Diagnosis not present
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
## 2023-09-30 DIAGNOSIS — N186 End stage renal disease: Secondary | ICD-10-CM | POA: Diagnosis not present

## 2023-09-30 DIAGNOSIS — Z992 Dependence on renal dialysis: Secondary | ICD-10-CM | POA: Diagnosis not present

## 2023-10-01 DIAGNOSIS — Z992 Dependence on renal dialysis: Secondary | ICD-10-CM | POA: Diagnosis not present

## 2023-10-01 DIAGNOSIS — N186 End stage renal disease: Secondary | ICD-10-CM | POA: Diagnosis not present

## 2023-10-02 DIAGNOSIS — Z992 Dependence on renal dialysis: Secondary | ICD-10-CM | POA: Diagnosis not present

## 2023-10-02 DIAGNOSIS — N186 End stage renal disease: Secondary | ICD-10-CM | POA: Diagnosis not present

## 2023-10-03 ENCOUNTER — Encounter: Payer: Self-pay | Admitting: Family Medicine

## 2023-10-03 ENCOUNTER — Ambulatory Visit (INDEPENDENT_AMBULATORY_CARE_PROVIDER_SITE_OTHER): Payer: Medicare Other | Admitting: Family Medicine

## 2023-10-03 VITALS — BP 136/74 | HR 65 | Temp 98.1°F | Ht 68.0 in | Wt 132.4 lb

## 2023-10-03 DIAGNOSIS — Z992 Dependence on renal dialysis: Secondary | ICD-10-CM | POA: Diagnosis not present

## 2023-10-03 DIAGNOSIS — N186 End stage renal disease: Secondary | ICD-10-CM | POA: Diagnosis not present

## 2023-10-03 DIAGNOSIS — B028 Zoster with other complications: Secondary | ICD-10-CM

## 2023-10-03 MED ORDER — OXYCODONE-ACETAMINOPHEN 5-325 MG PO TABS: 1.0000 | ORAL_TABLET | ORAL | 0 refills | Status: AC | PRN

## 2023-10-03 NOTE — Progress Notes (Signed)
 Subjective:    Patient ID: Bradley Black, male    DOB: 03/20/1946, 78 y.o.   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  He has undergone surgical removal and radiation.  Show after that, week ago, he developed shingles on his lower left abdomen.  It radiates from his lower back in a dermatomal pattern around to his lower abdomen below the umbilicus.  The rash consists of erythematous blisters clumped together in a linear distribution.  It hurts and burns.  He was given Valtrex  at an urgent care but had a reaction to it.  Symptoms have been present now for over 7 days. Past Medical History:  Diagnosis Date   BPH (benign prostatic hyperplasia)    CAD S/P percutaneous coronary angioplasty    a. PTCA of OM 1988 & 1994 b. PCI with BMS to LAD in 1997 c. RCA PCI BMS 1999 & 2000 d. s/p CABG in 11/2011 with LIMA-LAD, SVG-PDA, SVG-OM2, and SVG-D1   Cataract    Chronic back pain    CKD (chronic kidney disease), stage III (HCC)    Cyst of bursa    R shoulder   Diabetic retinopathy (HCC)    DM (diabetes mellitus), type 2 with renal complications (HCC)    Essential hypertension    Frequent PVCs    GERD (gastroesophageal reflux disease)    Hypertensive retinopathy    Ischemic cardiomyopathy 11/2011   Intra-OP TEE: EF 40-45%, no regional WMA; improved Anterior WM post CABG.   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   LIMA to LAD, SVG to D1, SVG to OM2, SVG to PDA, EVH via right thigh and leg   Past Surgical History:  Procedure Laterality Date   AV FISTULA PLACEMENT Left 07/28/2020   Procedure: LEFT ARM ARTERIOVENOUS (AV) FISTULA  CREATION;  Surgeon: Oris Krystal FALCON, MD;  Location: [facility];  Service: Vascular;  Laterality: Left;   BACK SURGERY  10/01/1968   BIOPSY  01/25/2022   Procedure: BIOPSY;  Surgeon: Shaaron Lamar HERO, MD;  Location: [facility];  Service: Endoscopy;;   CARDIAC CATHETERIZATION  10/02/2011   CATARACT EXTRACTION W/PHACO Left 09/14/2022   Procedure: CATARACT EXTRACTION PHACO AND INTRAOCULAR LENS PLACEMENT (IOC);  Surgeon: Harrie Agent, MD;  Location: [facility];  Service: Ophthalmology;  Laterality: Left;  CDE: 8.66   CATARACT EXTRACTION W/PHACO Right 09/28/2022   Procedure: CATARACT EXTRACTION PHACO AND INTRAOCULAR LENS PLACEMENT (IOC);  Surgeon: Harrie Agent, MD;  Location: [facility];  Service: Ophthalmology;  Laterality: Right;  CDE 7.79   COLONOSCOPY WITH PROPOFOL  N/A 01/25/2022   Procedure: COLONOSCOPY WITH PROPOFOL ;  Surgeon: Shaaron Lamar HERO, MD;  Location: [facility];  Service: Endoscopy;  Laterality: N/A;  10:30am   CORONARY ANGIOPLASTY  10/01/1992   OM   CORONARY ANGIOPLASTY  10/02/1995   LAD   CORONARY ANGIOPLASTY WITH STENT PLACEMENT  10/01/1997   RCA   CORONARY ANGIOPLASTY WITH STENT PLACEMENT  10/02/1999   RCA   CORONARY ARTERY BYPASS GRAFT  12/27/2011   Procedure: CORONARY ARTERY BYPASS GRAFTING (CABG);  Surgeon: Sudie VEAR Laine, MD;  Location: [facility] OR;  Service: Open Heart Surgery;  Laterality: N/A;  Times four. On pump. Using endoscopically harvested right greater saphenous vein and left internal mammary artery.    CORONARY STENT INTERVENTION N/A 07/25/2023  Procedure: CORONARY STENT INTERVENTION;  Surgeon: Verlin Lonni BIRCH, MD;  Location: [facility] IN[facility] LAB;  Service: Cardiovascular;  Laterality: N/A;   ESOPHAGOGASTRODUODENOSCOPY (EGD) WITH PROPOFOL  N/A 01/25/2022   Procedure: ESOPHAGOGASTRODUODENOSCOPY (EGD) WITH PROPOFOL ;  Surgeon: Shaaron Lamar HERO, MD;  Location: [facility];  Service: Endoscopy;  Laterality: N/A;   HERNIA REPAIR Bilateral    INCISION AND DRAINAGE OF WOUND  10/01/2004   [place]   INSERTION OF DIALYSIS CATHETER Right 07/25/2020   Procedure: INSERTION OF DIALYSIS CATHETER;  Surgeon: Kallie Manuelita BROCKS, MD;  Location: [facility];  Service: General;  Laterality: Right;    INTRAOPERATIVE TRANSESOPHAGEAL ECHOCARDIOGRAM  12/27/2011   Global hypokinesis with EF of 40-45%, improved LAD distribution wall motion.   LEFT HEART CATH AND CORS/GRAFTS ANGIOGRAPHY N/A 07/25/2023   Procedure: LEFT HEART CATH AND CORS/GRAFTS ANGIOGRAPHY;  Surgeon: Verlin Lonni BIRCH, MD;  Location: [facility] LAB;  Service: Cardiovascular;  Laterality: N/A;   LEFT HEART CATHETERIZATION WITH CORONARY ANGIOGRAM N/A 12/13/2011   Procedure: LEFT HEART CATHETERIZATION WITH CORONARY ANGIOGRAM;  Surgeon: Alm LELON Clay, MD;  Location: [facility] CATH LAB;  Service: Cardiovascular;  Laterality: N/A;   LUMBAR FUSION     MASS EXCISION  10/24/2011   R arm   PAROTIDECTOMY Right 07/05/2023   Procedure: 1. Right radical parotidectomy with wide local excision facial skin 3x3cm, dissection and preservation of facial nerve (CPT 42415, 11644);  Surgeon: Luciano Standing, MD;  Location: [facility] OR;  Service: ENT;  Laterality: Right;   POLYPECTOMY  01/25/2022   Procedure: POLYPECTOMY;  Surgeon: Shaaron Lamar HERO, MD;  [facility];  Service: Endoscopy;;   RADICAL NECK DISSECTION Right 07/05/2023   Procedure: 2. Right modified radical neck dissection 1-3 (CPT 2703192853) 3. Cervicofacial rotation advancement flap 10x10cm for closure of 3x3cm defect (CPT 14040);  Surgeon: Luciano Standing, MD;  Location: [facility] OR;  Service: ENT;  Laterality: Right;   RIGHT HEART CATH N/A 07/12/2020   Procedure: RIGHT HEART CATH;  Surgeon: Claudene Victory LELON, MD;  Location: [facility] INVASIVE CV LAB;  Service: Cardiovascular;  Laterality: N/A;   TRANSESOPHAGEAL ECHOCARDIOGRAM  10/02/2011   Current Outpatient Medications on File Prior to Visit  Medication Sig Dispense Refill   aspirin  EC 81 MG tablet Take 81 mg by mouth daily.     atorvastatin  (LIPITOR) 40 MG tablet Take 1 tablet (40 mg total) by mouth daily. 90 tablet 3   brimonidine  (ALPHAGAN ) 0.2 % ophthalmic solution Place 1 drop into both eyes 2 (two) times daily.     Cholecalciferol (VITAMIN  D3) 25 MCG (1000 UT) CAPS Take 1 capsule (1,000 Units total) by mouth daily at 6 (six) AM. 60 capsule 6   clopidogrel  (PLAVIX ) 75 MG tablet Take 1 tablet (75 mg total) by mouth daily. 90 tablet 1   ferrous sulfate 325 (65 FE) MG tablet Take 325 mg by mouth daily with breakfast.     hydrALAZINE  (APRESOLINE ) 50 MG tablet TAKE ONE AND ONE-HALF TABLETS (75MG  TOTAL) BY MOUTH THREE TIMES DAILY 270 tablet 1   ketorolac  (ACULAR ) 0.5 % ophthalmic solution Place 1 drop into both eyes in the morning, at noon, and at bedtime.     levothyroxine  (SYNTHROID ) 50 MCG tablet TAKE ONE TABLET ( TOTAL) BY MOUTH DAILY BEFORE BREAKFAST 90 tablet 1   multivitamin (RENA-VIT) TABS tablet Take 1 tablet by mouth daily.     pantoprazole  (PROTONIX ) 40 MG tablet TAKE ONE TABLET BY MOUTH TWICE A DAY 180 tablet 0   sacubitril -valsartan  (ENTRESTO ) 97-103  MG TAKE ONE TABLET BY MOUTH TWICE A DAY 60 tablet 3   spironolactone  (ALDACTONE ) 25 MG tablet Take 0.5 tablets (12.5 mg total) by mouth daily. 15 tablet 6   torsemide  (DEMADEX ) 20 MG tablet Take 20 mg by mouth daily.     cyclobenzaprine  (FLEXERIL ) 5 MG tablet Take 1 tablet (5 mg total) by mouth 3 (three) times daily as needed for muscle spasms. (Patient not taking: Reported on 07/30/2023) 30 tablet 1   doxazosin  (CARDURA ) 1 MG tablet Take 2 mg by mouth daily. (Patient not taking: Reported on 10/03/2023)     isosorbide  mononitrate (IMDUR ) 30 MG 24 hr tablet Take 1 tablet (30 mg total) by mouth daily. 30 tablet 1   sildenafil  (VIAGRA ) 100 MG tablet Take 0.5-1 tablets (50-100 mg total) by mouth daily as needed for erectile dysfunction. (Patient not taking: Reported on 07/30/2023) 60 tablet 1   valACYclovir  (VALTREX ) 500 MG tablet Take 1 tablet (500 mg total) by mouth daily for 7 days. (Patient not taking: Reported on 10/03/2023) 7 tablet 0   No current facility-administered medications on file prior to visit.   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  Tobacco Use   Smoking status: Former    Current packs/day: 0.00    Average packs/day: 1 pack/day for 15.0 years (15.0 ttl pk-yrs)    Types: Cigarettes    Start date: 10/02/1971    Quit date: 10/01/1986    Years since quitting: 37.0   Smokeless tobacco: Never   Tobacco comments:    quit about 30 yrs ago  Vaping Use   Vaping status: Never Used  Substance and Sexual Activity   Alcohol use: No   Drug use: No   Sexual activity: Not Currently  Other Topics Concern   Not on file  Social History Narrative   Married,  father of one,  grandfather 1. Works out at [employer] at [employer] roughly 2-3 days a week. He works on a treadmill. He does note having a hard time getting his heart rate up.   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   He quit smoking in 1988, and does not drink alcohol.   Social Drivers of Corporate Investment Banker Strain: Low Risk  (12/06/2022)   Overall Financial Resource Strain (CARDIA)    Difficulty of Paying Living Expenses: Not hard at all  Food Insecurity: No Food Insecurity (08/06/2023)   Hunger Vital Sign    Worried About Running Out of Food in the Last Year: Never true    Ran Out of Food in the Last Year: Never true  Transportation Needs: No Transportation Needs (08/06/2023)   PRAPARE - Administrator, Civil Service (Medical): No    Lack of Transportation (Non-Medical): No  Physical Activity: Insufficiently Active (12/06/2022)   Exercise Vital Sign    Days of Exercise per Week: 3 days    Minutes of Exercise per Session: 30 min  Stress: No Stress Concern Present (12/06/2022)   [employer] of Occupational Health - Occupational Stress Questionnaire    Feeling of Stress : Not at all  Social Connections:  Moderately Integrated (12/06/2022)   Social Connection and Isolation Panel [NHANES]  Frequency of Communication with Friends and Family: More than three times a week    Frequency of Social Gatherings with Friends and Family: More than three times a week    Attends Religious Services: 1 to 4 times per year    Active Member of [employer] or Organizations: No    Attends Banker Meetings: Never    Marital Status: Married  Catering Manager Violence: Not At Risk (12/06/2022)   Humiliation, Afraid, Rape, and Kick questionnaire    Fear of Current or Ex-Partner: No    Emotionally Abused: No    Physically Abused: No    Sexually Abused: No      Review of Systems  All other systems reviewed and are negative.      Objective:   Physical Exam Vitals reviewed.  Constitutional:      Appearance: Normal appearance.  Cardiovascular:     Rate and Rhythm: Normal rate and regular rhythm.     Heart sounds: Normal heart sounds. No murmur heard. Pulmonary:     Effort: Pulmonary effort is normal. No respiratory distress.     Breath sounds: Normal breath sounds. No stridor. No wheezing, rhonchi or rales.  Abdominal:     General: Bowel sounds are normal. There is no distension.     Palpations: Abdomen is soft.     Tenderness: There is no abdominal tenderness.    Skin:    Findings: Rash present. No erythema. Rash is papular and vesicular.  Neurological:     Mental Status: He is alert.           Assessment & Plan:  Herpes zoster with other complication The pain keeps the patient awake at night.  He is unable to sleep.  I will give the patient Percocet 5/325 1 every 4-6 hours as needed for pain.  He is outside the therapeutic window for Valtrex  or acyclovir would be of benefit.  Unfortunately at this point, the virus will simply have to run its course.  We discussed gabapentin  but he is a high risk candidate for this given his end-stage renal failure

## 2023-10-04 DIAGNOSIS — Z992 Dependence on renal dialysis: Secondary | ICD-10-CM | POA: Diagnosis not present

## 2023-10-04 DIAGNOSIS — N186 End stage renal disease: Secondary | ICD-10-CM | POA: Diagnosis not present

## 2023-10-05 DIAGNOSIS — N186 End stage renal disease: Secondary | ICD-10-CM | POA: Diagnosis not present

## 2023-10-05 DIAGNOSIS — Z992 Dependence on renal dialysis: Secondary | ICD-10-CM | POA: Diagnosis not present

## 2023-10-06 DIAGNOSIS — Z992 Dependence on renal dialysis: Secondary | ICD-10-CM | POA: Diagnosis not present

## 2023-10-06 DIAGNOSIS — N186 End stage renal disease: Secondary | ICD-10-CM | POA: Diagnosis not present

## 2023-10-07 ENCOUNTER — Other Ambulatory Visit: Payer: Self-pay | Admitting: Family Medicine

## 2023-10-07 DIAGNOSIS — Z992 Dependence on renal dialysis: Secondary | ICD-10-CM | POA: Diagnosis not present

## 2023-10-07 DIAGNOSIS — N186 End stage renal disease: Secondary | ICD-10-CM | POA: Diagnosis not present

## 2023-10-08 ENCOUNTER — Ambulatory Visit: Payer: Self-pay | Admitting: Physical Therapy

## 2023-10-08 DIAGNOSIS — N186 End stage renal disease: Secondary | ICD-10-CM | POA: Diagnosis not present

## 2023-10-08 DIAGNOSIS — Z992 Dependence on renal dialysis: Secondary | ICD-10-CM | POA: Diagnosis not present

## 2023-10-09 DIAGNOSIS — N186 End stage renal disease: Secondary | ICD-10-CM | POA: Diagnosis not present

## 2023-10-09 DIAGNOSIS — Z992 Dependence on renal dialysis: Secondary | ICD-10-CM | POA: Diagnosis not present

## 2023-10-10 DIAGNOSIS — N186 End stage renal disease: Secondary | ICD-10-CM | POA: Diagnosis not present

## 2023-10-10 DIAGNOSIS — Z992 Dependence on renal dialysis: Secondary | ICD-10-CM | POA: Diagnosis not present

## 2023-10-11 ENCOUNTER — Ambulatory Visit: Payer: Self-pay | Admitting: Radiation Oncology

## 2023-10-11 DIAGNOSIS — Z992 Dependence on renal dialysis: Secondary | ICD-10-CM | POA: Diagnosis not present

## 2023-10-11 DIAGNOSIS — N186 End stage renal disease: Secondary | ICD-10-CM | POA: Diagnosis not present

## 2023-10-12 DIAGNOSIS — N186 End stage renal disease: Secondary | ICD-10-CM | POA: Diagnosis not present

## 2023-10-12 DIAGNOSIS — Z992 Dependence on renal dialysis: Secondary | ICD-10-CM | POA: Diagnosis not present

## 2023-10-13 DIAGNOSIS — N186 End stage renal disease: Secondary | ICD-10-CM | POA: Diagnosis not present

## 2023-10-13 DIAGNOSIS — Z992 Dependence on renal dialysis: Secondary | ICD-10-CM | POA: Diagnosis not present

## 2023-10-14 DIAGNOSIS — N186 End stage renal disease: Secondary | ICD-10-CM | POA: Diagnosis not present

## 2023-10-14 DIAGNOSIS — Z992 Dependence on renal dialysis: Secondary | ICD-10-CM | POA: Diagnosis not present

## 2023-10-15 ENCOUNTER — Encounter: Payer: Self-pay | Admitting: Radiation Oncology

## 2023-10-15 ENCOUNTER — Ambulatory Visit
Admission: RE | Admit: 2023-10-15 | Discharge: 2023-10-15 | Disposition: A | Discharge status: Home / Self Care | Payer: Medicare Other | Source: Ambulatory Visit | Attending: Radiation Oncology | Admitting: Radiation Oncology

## 2023-10-15 ENCOUNTER — Inpatient Hospital Stay: Payer: Medicare Other | Attending: Radiation Oncology | Admitting: Dietician

## 2023-10-15 VITALS — BP 123/64 | HR 63 | Temp 98.4°F | Resp 19 | Wt 131.0 lb

## 2023-10-15 DIAGNOSIS — Z7982 Long term (current) use of aspirin: Secondary | ICD-10-CM | POA: Insufficient documentation

## 2023-10-15 DIAGNOSIS — F329 Major depressive disorder, single episode, unspecified: Secondary | ICD-10-CM | POA: Insufficient documentation

## 2023-10-15 DIAGNOSIS — Z7902 Long term (current) use of antithrombotics/antiplatelets: Secondary | ICD-10-CM | POA: Diagnosis not present

## 2023-10-15 DIAGNOSIS — Z79890 Hormone replacement therapy (postmenopausal): Secondary | ICD-10-CM | POA: Insufficient documentation

## 2023-10-15 DIAGNOSIS — N186 End stage renal disease: Secondary | ICD-10-CM | POA: Diagnosis not present

## 2023-10-15 DIAGNOSIS — Z992 Dependence on renal dialysis: Secondary | ICD-10-CM | POA: Diagnosis not present

## 2023-10-15 DIAGNOSIS — B029 Zoster without complications: Secondary | ICD-10-CM | POA: Insufficient documentation

## 2023-10-15 DIAGNOSIS — Z79899 Other long term (current) drug therapy: Secondary | ICD-10-CM | POA: Diagnosis not present

## 2023-10-15 DIAGNOSIS — C07 Malignant neoplasm of parotid gland: Secondary | ICD-10-CM | POA: Diagnosis not present

## 2023-10-15 DIAGNOSIS — Z7952 Long term (current) use of systemic steroids: Secondary | ICD-10-CM | POA: Diagnosis not present

## 2023-10-15 HISTORY — DX: Zoster without complications: B02.9

## 2023-10-15 MED ORDER — PREDNISONE 20 MG PO TABS: ORAL_TABLET | ORAL | 0 refills | Status: AC

## 2023-10-15 NOTE — Progress Notes (Signed)
 Radiation Oncology         [phone] ________________________________  Name: Bradley Black MRN: [id number]  Date: 10/15/2023  DOB: [birth year]  Follow-Up Visit Note  CC: Duanne Butler DASEN, MD  Luciano Standing, MD  Diagnosis and Prior Radiotherapy:       ICD-10-CM   1. 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 : s/p right radical parotidectomy, right radical neck dissection, and preservation of the facial nerve (negative margins and nodes) and adjuvant radiation  ==========DELIVERED PLANS==========  First Treatment Date: 2023-08-21 Last Treatment Date: 2023-09-19   Plan Name: [id number] Site: Parotid, Right Technique: IMRT Mode: Photon Dose Per Fraction: 2.5 Gy Prescribed Dose (Delivered / Prescribed): 50 Gy / 50 Gy Prescribed Fxs (Delivered / Prescribed): 20 / 20  CHIEF COMPLAINT:  Here for follow-up and surveillance of right parotid gland cancer  Narrative:  The patient returns today for routine follow-up. He completed his radiation treatment on 09/19/2023.      Pain issues, if any: Patient states that he has 4 out of 10 shingles pain on abdomen that radiates to back. Using a feeding tube?: Patient denies feeding tube Weight changes, if any: Patient has lost 10 pounds over the last 3 months Swallowing issues, if any: No swallowing issues Smoking or chewing tobacco? Patient denies smoking or chewing tobacco. Using fluoride toothpaste daily? Patient does use fluoride toothpaste everyday. Last ENT visit was on: Patient has appointment coming up on this Thursday. Other notable issues, if any: Patient reports having depression and would like to speak with someone about it. He is open to medications and therapy.             ALLERGIES:  has no known allergies.  Meds: Current  Outpatient Medications  Medication Sig Dispense Refill   aspirin  EC 81 MG tablet Take 81 mg by mouth daily.     atorvastatin  (LIPITOR) 40 MG tablet Take 1 tablet (40 mg total) by mouth daily. 90 tablet 3   Cholecalciferol (VITAMIN D3) 25 MCG (1000 UT) CAPS Take 1 capsule (1,000 Units total) by mouth daily at 6 (six) AM. 60 capsule 6   clopidogrel  (PLAVIX ) 75 MG tablet Take 1 tablet (75 mg total) by mouth daily. 90 tablet 1   doxazosin  (CARDURA ) 1 MG tablet Take 2 mg by mouth daily.     ferrous sulfate 325 (65 FE) MG tablet Take 325 mg by mouth daily with breakfast.     hydrALAZINE  (APRESOLINE ) 50 MG tablet TAKE ONE AND ONE-HALF TABLETS (75MG  TOTAL) BY MOUTH THREE TIMES DAILY 270 tablet 1   levothyroxine  (SYNTHROID ) 50 MCG tablet TAKE ONE TABLET ( TOTAL) BY MOUTH DAILY BEFORE BREAKFAST 90 tablet 1   multivitamin (RENA-VIT) TABS tablet Take 1 tablet by mouth daily.     pantoprazole  (PROTONIX ) 40 MG tablet TAKE ONE TABLET BY MOUTH TWICE A DAY 180 tablet 0   predniSONE  (DELTASONE ) 20 MG tablet Take 2 tablets (40 mg total) by mouth daily with breakfast for 7 days, THEN 1 tablet (20 mg total) daily with breakfast for 5 days, THEN 0.5 tablets (10 mg total) daily with breakfast for 5 days, THEN 0.5 tablets (10 mg total) every other day for 5 days. 22 tablet 0   sacubitril -valsartan  (ENTRESTO ) 97-103  MG TAKE ONE TABLET BY MOUTH TWICE A DAY 60 tablet 3   spironolactone  (ALDACTONE ) 25 MG tablet Take 0.5 tablets (12.5 mg total) by mouth daily. 15 tablet 6   torsemide  (DEMADEX ) 20 MG tablet Take 20 mg by mouth daily.     brimonidine  (ALPHAGAN ) 0.2 % ophthalmic solution Place 1 drop into both eyes 2 (two) times daily. (Patient not taking: Reported on 10/15/2023)     cyclobenzaprine  (FLEXERIL ) 5 MG tablet Take 1 tablet (5 mg total) by mouth 3 (three) times daily as needed for muscle spasms. (Patient not taking: Reported on 07/30/2023) 30 tablet 1   isosorbide  mononitrate (IMDUR ) 30 MG 24 hr tablet Take 1  tablet (30 mg total) by mouth daily. 30 tablet 1   ketorolac  (ACULAR ) 0.5 % ophthalmic solution Place 1 drop into both eyes in the morning, at noon, and at bedtime. (Patient not taking: Reported on 10/15/2023)     oxyCODONE -acetaminophen  (PERCOCET/ROXICET) 5-325 MG tablet Take 1 tablet by mouth every 4 (four) hours as needed for severe pain (pain score 7-10). (Patient not taking: Reported on 10/15/2023) 30 tablet 0   sildenafil  (VIAGRA ) 100 MG tablet Take 0.5-1 tablets (50-100 mg total) by mouth daily as needed for erectile dysfunction. (Patient not taking: Reported on 07/30/2023) 60 tablet 1   No current facility-administered medications for this encounter.    Physical Findings: The patient is in no acute distress. Patient is alert and oriented. Wt Readings from Last 3 Encounters:  10/15/23 131 lb (59.4 kg)  10/03/23 132 lb 6 oz (60 kg)  08/06/23 140 lb 6.4 oz (63.7 kg)    weight is 131 lb (59.4 kg). His oral temperature is 98.4 F (36.9 C). His blood pressure is 123/64 and his pulse is 63. His respiration is 19 and oxygen saturation is 100%. .  General: Alert and oriented, in no acute distress HEENT: Head is normocephalic. Extraocular movements are intact. Oropharynx is notable for mucositis on the tongue with no concerning lesions.  Neck: Neck is notable for no palpable adenopathy. Mild lymphedema present.  Skin: Skin in treatment fields shows satisfactory healing with some patches of dry skin.  Heart: Regular in rate and rhythm with no murmurs, rubs, or gallops. Chest: Clear to auscultation bilaterally, with no rhonchi, wheezes, or rales. Abdomen: Soft, nontender, nondistended, with no rigidity or guarding. Crusted lesions in a linear pattern from the left lower abdomen to the back.  Extremities: No cyanosis or edema. Lymphatics: see Neck Exam Psychiatric: Judgment and insight are intact. Depressed affect.    Lab Findings: Lab Results  Component Value Date   WBC 10.2 07/18/2023    HGB 10.5 (L) 07/18/2023   HCT 32.3 (L) 07/18/2023   MCV 100 (H) 07/18/2023   PLT 218 07/18/2023    Lab Results  Component Value Date   TSH 3.53 04/22/2023    Radiographic Findings: No results found.  Impression/Plan:    1) Head and Neck Cancer Status: Patient continues to heal from the effects of his radiation treatment.   2) Nutritional Status: stable from 2 weeks ago. Patient met with nutrition today. Encouraged patient to continue to push oral intake.  Wt Readings from Last 3 Encounters:  10/15/23 131 lb (59.4 kg)  10/03/23 132 lb 6 oz (60 kg)  08/06/23 140 lb 6.4 oz (63.7 kg)  PEG tube: N/A  3) Risk Factors: The patient has been educated about risk factors including alcohol and tobacco abuse; they understand that avoidance of alcohol and tobacco is important  to prevent recurrences as well as other cancers  4) Swallowing: Patient denies issues with swallowing. He missed his last appointment with PT, we are working to get this rescheduled.   5) Dental: Encouraged to continue regular followup with dentistry, and dental hygiene including fluoride rinses. Using fluoride toothpaste daily.   6) Thyroid  function: Checking annually Lab Results  Component Value Date   TSH 3.53 04/22/2023    7) Shingles: patient is unfortunately experiencing persistent 10/10 pain at night from his shingles infection. Prescription for prednisone  sent to his pharmacy to help alleviate symptoms.   8) Depression: Patient has experienced a worsening depressed mood since the diagnosis and treatment. He has been on antidepressants in the past, but has stopped these due to SE. He is in a support group for dialysis, but is interested in talking to someone from the cancer center. Referral to social work has been made today.   9) CT of the neck and chest (without contrast due to poor kidney function) ordered for 3 months and appointment with Dr. Izell to follow. Patient is scheduled to see ENT on 10/21/2022.  The patient was encouraged to call with any issues or questions before then.  On date of service, in total, I spent 30 minutes on this encounter. Patient was seen in person. _____________________________________    Leeroy Due, PA-C

## 2023-10-15 NOTE — Progress Notes (Signed)
 The patient is here for a follow-up appointment today. First Treatment Date: 2023-08-21 Last Treatment Date: 2023-09-19 Pain issues, if any: Patient states that he has 4 out of 10 shingles pain on abdomen that radiates to back. Using a feeding tube?: Patient denies feeding tube Weight changes, if any: Patient has lost 10 pounds over the last 3 months Swallowing issues, if any: No swallowing issues Smoking or chewing tobacco? Patient denies smoking or chewing tobacco. Using fluoride toothpaste daily? Patient does use fluoride toothpaste everyday. Last ENT visit was on: Patient has appointment coming up on This Thursday. Other notable issues, if any: Patient reports having depression and would like to speak with someone about it. He is open to medications and therapy.  Blood pressure 123/64, pulse 63, temperature 98.4 F (36.9 C), temperature source Oral, resp. rate 19, weight 131 lb (59.4 kg), SpO2 100%. Filed Weights   10/15/23 1501  Weight: 131 lb (59.4 kg)

## 2023-10-15 NOTE — Progress Notes (Signed)
 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 S/p right radical parotidectomy (atrium). He has completed 4 weeks of hypo-fractionated radiation therapy under the care of Dr. Izell. Final RT 12/19.  Met with patient and wife in clinic. Patient reports poor appetite secondary to loss of taste. This started the day after final therapy. Patient states food taste horrible and I can't eat it. Fritos taste normal as well as Ensure. He is currently drinking 2 Boost daily. Patient is recovering from recent Shingles virus. Reports ongoing pain which is intense at times. He is dehydrated and drinking more water  per nephrology. Wife states they were unable to draw monthly labs yesterday due to dehydration.    Medications: reviewed   Labs: reviewed   Anthropometrics: Wt 131 lb today decreased 8% in 4 weeks - this is severe for time frame  12/16 - 143.8 lb 12/9 - 143.4 lb  12/3 - 141 lb    NUTRITION DIAGNOSIS: Food and nutrition related knowledge deficit - continues    INTERVENTION:  Discussed strategies for taste changes, suggested trying baking soda salt water  rinses before meals Continue drinking Ensure Plus/equivalent, recommend 3/day in between meals - samples + coupons PA-C in room - will defer dehydration recommendations to provider    MONITORING, EVALUATION, GOAL: wt trends, intake    NEXT VISIT: Tuesday February 4 via telephone

## 2023-10-16 ENCOUNTER — Inpatient Hospital Stay: Payer: Medicare Other

## 2023-10-16 DIAGNOSIS — Z992 Dependence on renal dialysis: Secondary | ICD-10-CM | POA: Diagnosis not present

## 2023-10-16 DIAGNOSIS — N186 End stage renal disease: Secondary | ICD-10-CM | POA: Diagnosis not present

## 2023-10-16 NOTE — Progress Notes (Signed)
 [facility] Clinical Social Work  Clinical Social Work was referred by medical provider for assessment of psychosocial needs.  Clinical Social Worker contacted patient by phone to offer support and assess for needs.   Counseling appointment scheduled for 1/29   Maudie Sorrow, LCSW  Clinical Social Worker [facility]

## 2023-10-16 NOTE — Addendum Note (Signed)
 Encounter addended by: Pearlene Bouchard, PA-C on: 10/16/2023 9:12 AM  Actions taken: Follow-up modified

## 2023-10-17 DIAGNOSIS — Z992 Dependence on renal dialysis: Secondary | ICD-10-CM | POA: Diagnosis not present

## 2023-10-17 DIAGNOSIS — N186 End stage renal disease: Secondary | ICD-10-CM | POA: Diagnosis not present

## 2023-10-18 DIAGNOSIS — N186 End stage renal disease: Secondary | ICD-10-CM | POA: Diagnosis not present

## 2023-10-18 DIAGNOSIS — Z992 Dependence on renal dialysis: Secondary | ICD-10-CM | POA: Diagnosis not present

## 2023-10-19 DIAGNOSIS — N186 End stage renal disease: Secondary | ICD-10-CM | POA: Diagnosis not present

## 2023-10-19 DIAGNOSIS — Z992 Dependence on renal dialysis: Secondary | ICD-10-CM | POA: Diagnosis not present

## 2023-10-20 DIAGNOSIS — N186 End stage renal disease: Secondary | ICD-10-CM | POA: Diagnosis not present

## 2023-10-20 DIAGNOSIS — Z992 Dependence on renal dialysis: Secondary | ICD-10-CM | POA: Diagnosis not present

## 2023-10-21 DIAGNOSIS — N186 End stage renal disease: Secondary | ICD-10-CM | POA: Diagnosis not present

## 2023-10-21 DIAGNOSIS — Z992 Dependence on renal dialysis: Secondary | ICD-10-CM | POA: Diagnosis not present

## 2023-10-22 ENCOUNTER — Ambulatory Visit: Payer: Medicare Other | Admitting: Cardiology

## 2023-10-22 DIAGNOSIS — Z9049 Acquired absence of other specified parts of digestive tract: Secondary | ICD-10-CM | POA: Diagnosis not present

## 2023-10-22 DIAGNOSIS — Z992 Dependence on renal dialysis: Secondary | ICD-10-CM | POA: Diagnosis not present

## 2023-10-22 DIAGNOSIS — N186 End stage renal disease: Secondary | ICD-10-CM | POA: Diagnosis not present

## 2023-10-22 DIAGNOSIS — Z9889 Other specified postprocedural states: Secondary | ICD-10-CM | POA: Diagnosis not present

## 2023-10-22 DIAGNOSIS — C07 Malignant neoplasm of parotid gland: Secondary | ICD-10-CM | POA: Diagnosis not present

## 2023-10-23 DIAGNOSIS — N186 End stage renal disease: Secondary | ICD-10-CM | POA: Diagnosis not present

## 2023-10-23 DIAGNOSIS — Z992 Dependence on renal dialysis: Secondary | ICD-10-CM | POA: Diagnosis not present

## 2023-10-24 DIAGNOSIS — Z992 Dependence on renal dialysis: Secondary | ICD-10-CM | POA: Diagnosis not present

## 2023-10-24 DIAGNOSIS — N186 End stage renal disease: Secondary | ICD-10-CM | POA: Diagnosis not present

## 2023-10-25 DIAGNOSIS — I120 Hypertensive chronic kidney disease with stage 5 chronic kidney disease or end stage renal disease: Secondary | ICD-10-CM | POA: Diagnosis not present

## 2023-10-25 DIAGNOSIS — N2581 Secondary hyperparathyroidism of renal origin: Secondary | ICD-10-CM | POA: Diagnosis not present

## 2023-10-25 DIAGNOSIS — N186 End stage renal disease: Secondary | ICD-10-CM | POA: Diagnosis not present

## 2023-10-25 DIAGNOSIS — Z992 Dependence on renal dialysis: Secondary | ICD-10-CM | POA: Diagnosis not present

## 2023-10-25 DIAGNOSIS — D631 Anemia in chronic kidney disease: Secondary | ICD-10-CM | POA: Diagnosis not present

## 2023-10-26 DIAGNOSIS — N186 End stage renal disease: Secondary | ICD-10-CM | POA: Diagnosis not present

## 2023-10-26 DIAGNOSIS — Z992 Dependence on renal dialysis: Secondary | ICD-10-CM | POA: Diagnosis not present

## 2023-10-27 DIAGNOSIS — Z992 Dependence on renal dialysis: Secondary | ICD-10-CM | POA: Diagnosis not present

## 2023-10-27 DIAGNOSIS — N186 End stage renal disease: Secondary | ICD-10-CM | POA: Diagnosis not present

## 2023-10-28 DIAGNOSIS — N186 End stage renal disease: Secondary | ICD-10-CM | POA: Diagnosis not present

## 2023-10-28 DIAGNOSIS — Z992 Dependence on renal dialysis: Secondary | ICD-10-CM | POA: Diagnosis not present

## 2023-10-29 DIAGNOSIS — N186 End stage renal disease: Secondary | ICD-10-CM | POA: Diagnosis not present

## 2023-10-29 DIAGNOSIS — Z992 Dependence on renal dialysis: Secondary | ICD-10-CM | POA: Diagnosis not present

## 2023-10-30 ENCOUNTER — Telehealth: Payer: Self-pay

## 2023-10-30 DIAGNOSIS — Z992 Dependence on renal dialysis: Secondary | ICD-10-CM | POA: Diagnosis not present

## 2023-10-30 DIAGNOSIS — N186 End stage renal disease: Secondary | ICD-10-CM | POA: Diagnosis not present

## 2023-10-30 NOTE — Telephone Encounter (Signed)
[facility] CSW Progress Note  Visual merchandiser  spoke with patient to assess needs . Patient reported being connected to LCSW at his [facility]" and feel needs are being met.   Marguerita Merles, LCSW Clinical Social Worker [facility]

## 2023-10-31 DIAGNOSIS — N186 End stage renal disease: Secondary | ICD-10-CM | POA: Diagnosis not present

## 2023-10-31 DIAGNOSIS — Z992 Dependence on renal dialysis: Secondary | ICD-10-CM | POA: Diagnosis not present

## 2023-11-01 DIAGNOSIS — Z992 Dependence on renal dialysis: Secondary | ICD-10-CM | POA: Diagnosis not present

## 2023-11-01 DIAGNOSIS — N186 End stage renal disease: Secondary | ICD-10-CM | POA: Diagnosis not present

## 2023-11-02 DIAGNOSIS — Z992 Dependence on renal dialysis: Secondary | ICD-10-CM | POA: Diagnosis not present

## 2023-11-02 DIAGNOSIS — N186 End stage renal disease: Secondary | ICD-10-CM | POA: Diagnosis not present

## 2023-11-03 DIAGNOSIS — Z992 Dependence on renal dialysis: Secondary | ICD-10-CM | POA: Diagnosis not present

## 2023-11-03 DIAGNOSIS — N186 End stage renal disease: Secondary | ICD-10-CM | POA: Diagnosis not present

## 2023-11-04 ENCOUNTER — Emergency Department (HOSPITAL_COMMUNITY): Payer: Medicare Other

## 2023-11-04 ENCOUNTER — Ambulatory Visit: Payer: Self-pay | Admitting: Family Medicine

## 2023-11-04 ENCOUNTER — Other Ambulatory Visit: Payer: Self-pay

## 2023-11-04 ENCOUNTER — Emergency Department (HOSPITAL_COMMUNITY)
Admission: EM | Admit: 2023-11-04 | Discharge: 2023-11-04 | Disposition: A | Discharge status: Home / Self Care | Payer: Medicare Other | Attending: Emergency Medicine | Admitting: Emergency Medicine

## 2023-11-04 ENCOUNTER — Encounter (HOSPITAL_COMMUNITY): Payer: Self-pay

## 2023-11-04 DIAGNOSIS — Z85818 Personal history of malignant neoplasm of other sites of lip, oral cavity, and pharynx: Secondary | ICD-10-CM | POA: Insufficient documentation

## 2023-11-04 DIAGNOSIS — M47812 Spondylosis without myelopathy or radiculopathy, cervical region: Secondary | ICD-10-CM | POA: Diagnosis not present

## 2023-11-04 DIAGNOSIS — I120 Hypertensive chronic kidney disease with stage 5 chronic kidney disease or end stage renal disease: Secondary | ICD-10-CM | POA: Insufficient documentation

## 2023-11-04 DIAGNOSIS — Z7982 Long term (current) use of aspirin: Secondary | ICD-10-CM | POA: Diagnosis not present

## 2023-11-04 DIAGNOSIS — S199XXA Unspecified injury of neck, initial encounter: Secondary | ICD-10-CM | POA: Diagnosis not present

## 2023-11-04 DIAGNOSIS — N186 End stage renal disease: Secondary | ICD-10-CM | POA: Diagnosis not present

## 2023-11-04 DIAGNOSIS — Z951 Presence of aortocoronary bypass graft: Secondary | ICD-10-CM | POA: Diagnosis not present

## 2023-11-04 DIAGNOSIS — M50222 Other cervical disc displacement at C5-C6 level: Secondary | ICD-10-CM | POA: Diagnosis not present

## 2023-11-04 DIAGNOSIS — E1122 Type 2 diabetes mellitus with diabetic chronic kidney disease: Secondary | ICD-10-CM | POA: Insufficient documentation

## 2023-11-04 DIAGNOSIS — I2510 Atherosclerotic heart disease of native coronary artery without angina pectoris: Secondary | ICD-10-CM | POA: Insufficient documentation

## 2023-11-04 DIAGNOSIS — Z992 Dependence on renal dialysis: Secondary | ICD-10-CM | POA: Insufficient documentation

## 2023-11-04 DIAGNOSIS — Z79899 Other long term (current) drug therapy: Secondary | ICD-10-CM | POA: Diagnosis not present

## 2023-11-04 DIAGNOSIS — Z7902 Long term (current) use of antithrombotics/antiplatelets: Secondary | ICD-10-CM | POA: Insufficient documentation

## 2023-11-04 DIAGNOSIS — R251 Tremor, unspecified: Secondary | ICD-10-CM | POA: Diagnosis not present

## 2023-11-04 DIAGNOSIS — S0990XA Unspecified injury of head, initial encounter: Secondary | ICD-10-CM | POA: Diagnosis not present

## 2023-11-04 DIAGNOSIS — I10 Essential (primary) hypertension: Secondary | ICD-10-CM | POA: Diagnosis not present

## 2023-11-04 LAB — CBC WITH DIFFERENTIAL/PLATELET
Abs Immature Granulocytes: 0.05 10*3/uL (ref 0.00–0.07)
Basophils Absolute: 0 10*3/uL (ref 0.0–0.1)
Basophils Relative: 0 %
Eosinophils Absolute: 0.3 10*3/uL (ref 0.0–0.5)
Eosinophils Relative: 3 %
HCT: 31.3 % — ABNORMAL LOW (ref 39.0–52.0)
Hemoglobin: 10.5 g/dL — ABNORMAL LOW (ref 13.0–17.0)
Immature Granulocytes: 1 %
Lymphocytes Relative: 7 %
Lymphs Abs: 0.6 10*3/uL — ABNORMAL LOW (ref 0.7–4.0)
MCH: 35.1 pg — ABNORMAL HIGH (ref 26.0–34.0)
MCHC: 33.5 g/dL (ref 30.0–36.0)
MCV: 104.7 fL — ABNORMAL HIGH (ref 80.0–100.0)
Monocytes Absolute: 0.7 10*3/uL (ref 0.1–1.0)
Monocytes Relative: 8 %
Neutro Abs: 7.7 10*3/uL (ref 1.7–7.7)
Neutrophils Relative %: 81 %
Platelets: 138 10*3/uL — ABNORMAL LOW (ref 150–400)
RBC: 2.99 MIL/uL — ABNORMAL LOW (ref 4.22–5.81)
RDW: 14.6 % (ref 11.5–15.5)
WBC: 9.4 10*3/uL (ref 4.0–10.5)
nRBC: 0 % (ref 0.0–0.2)

## 2023-11-04 LAB — COMPREHENSIVE METABOLIC PANEL
ALT: 20 U/L (ref 0–44)
AST: 18 U/L (ref 15–41)
Albumin: 3.1 g/dL — ABNORMAL LOW (ref 3.5–5.0)
Alkaline Phosphatase: 64 U/L (ref 38–126)
Anion gap: 13 (ref 5–15)
BUN: 57 mg/dL — ABNORMAL HIGH (ref 8–23)
CO2: 22 mmol/L (ref 22–32)
Calcium: 9.4 mg/dL (ref 8.9–10.3)
Chloride: 104 mmol/L (ref 98–111)
Creatinine, Ser: 4.26 mg/dL — ABNORMAL HIGH (ref 0.61–1.24)
GFR, Estimated: 13 mL/min — ABNORMAL LOW (ref >60)
Glucose, Bld: 157 mg/dL — ABNORMAL HIGH (ref 70–99)
Potassium: 3.8 mmol/L (ref 3.5–5.1)
Sodium: 139 mmol/L (ref 135–145)
Total Bilirubin: 1.1 mg/dL (ref 0.0–1.2)
Total Protein: 6 g/dL — ABNORMAL LOW (ref 6.5–8.1)

## 2023-11-04 LAB — URINALYSIS, ROUTINE W REFLEX MICROSCOPIC
Bacteria, UA: NONE SEEN
Bilirubin Urine: NEGATIVE
Glucose, UA: 150 mg/dL — AB
Hgb urine dipstick: NEGATIVE
Ketones, ur: NEGATIVE mg/dL
Leukocytes,Ua: NEGATIVE
Nitrite: NEGATIVE
Protein, ur: 30 mg/dL — AB
Specific Gravity, Urine: 1.015 (ref 1.005–1.030)
pH: 5 (ref 5.0–8.0)

## 2023-11-04 LAB — CBG MONITORING, ED: Glucose-Capillary: 152 mg/dL — ABNORMAL HIGH (ref 70–99)

## 2023-11-04 MED ORDER — LORAZEPAM 0.5 MG PO TABS: 0.5000 mg | ORAL_TABLET | Freq: Two times a day (BID) | ORAL | 0 refills | Status: DC | PRN

## 2023-11-04 MED ORDER — LORAZEPAM 2 MG/ML IJ SOLN
1.0000 mg | Freq: Once | INTRAMUSCULAR | Status: AC
  Administered 2023-11-04: 1 mg via INTRAVENOUS
  Filled 2023-11-04: qty 1

## 2023-11-04 NOTE — ED Notes (Signed)
 ED Provider at bedside.

## 2023-11-04 NOTE — Telephone Encounter (Signed)
  Chief Complaint: tremors, unsteady gait Symptoms: hands, arms and legs tremors/shakiness, unable to walk without assistance Frequency: sudden onset Friday (11/02/23) Pertinent Negatives: Patient denies changes in vision, changes in speech, lightheaded/dizziness,  Disposition: [x] ED /[] Urgent Care (no appt availability in office) / [] Appointment(In office/virtual)/ []  [facility]/ [] Home Care/ [] Refused Recommended Disposition /[] [facility]/ []  Follow-up with PCP Additional Notes: Patient states his blood sugar on Friday was 136 and states today blood sugar was 176. Patient denies any additional symptoms. Patient states 2 years ago he had a similar episode and went to the ED but was not given a reason or diagnosis. Patient agreeable to go to ED, have someone drive him.  Copied from CRM [id number]. Topic: Clinical - Red Word Triage >> Nov 04, 2023  8:58 AM Phill Myron wrote: Red Word that prompted transfer to Nurse Triage:  Shakes, trembling and difficulty walking started friday but getting works. Reason for Disposition  Nursing judgment or information in reference  Answer Assessment - Initial Assessment Questions 1. REASON FOR CALL: "What is your main concern right now?"     Tremors and shakiness.  2. ONSET: "When did the tremors start?"     February 1st.  3. SEVERITY: "How bad are the tremors?"     Patient states his arms, hands and legs are very shaky. He has to hold on to something to walk around. He states at one point his head was shaking badly too.  4. FEVER: "Do you have a fever?"     Denies.  5. OTHER SYMPTOMS: "Do you have any other new symptoms?"     Denies.  6. TREATMENTS AND RESPONSE: "What have you done so far to try to make this better? What medicines have you used?"     Patient states he has had this happen once before about 2 years ago. He states he was seen at the hospital and had tests run and states he never received a  diagnosis.  Protocols used: No Guideline Available-[facility]

## 2023-11-04 NOTE — Telephone Encounter (Signed)
Pt called back, make appt w/pcp for Thursday for 11/07/23 at 1000am.

## 2023-11-04 NOTE — ED Triage Notes (Signed)
Pt arrived via POV from home c/o uncontrollable tremors. Pt is dialysis Pt, recently finished steroid treatment for Shingles and does reports falling recently at home. Per Pts wife, Pt developed tremors this past Friday.

## 2023-11-04 NOTE — ED Provider Notes (Signed)
[place] EMERGENCY DEPARTMENT AT [facility] Provider Note   CSN: [id number] Arrival date & time: 11/04/23  0945     History  Chief Complaint  Patient presents with   Tremors    Bradley Black is a 78 y.o. male.  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  Pt just finished shingles meds a few days ago.  Pt developed some tremors on Friday, 1/31.  He did fall later that day and tremors seemed to worsen.  Pt has not been able to sleep.  He's been able to do his dialysis.  Pt said peritoneal fluid has been normal and not cloudy.  No fevers.  He's been able to eat/drink.  No trouble speaking/swallowing.       Home Medications Prior to Admission medications   Medication Sig Start Date End Date Taking? Authorizing Provider  LORazepam (ATIVAN) 0.5 MG tablet Take 1 tablet (0.5 mg total) by mouth 2 (two) times daily as needed (tremors). 11/04/23  Yes Jacalyn Lefevre, MD  aspirin EC 81 MG tablet Take 81 mg by mouth daily.    [provider]  atorvastatin (LIPITOR) 40 MG tablet Take 1 tablet (40 mg total) by mouth daily. 07/25/23 07/24/24  Arty Baumgartner, NP  brimonidine (ALPHAGAN) 0.2 % ophthalmic solution Place 1 drop into both eyes 2 (two) times daily. Patient not taking: Reported on 10/15/2023    [provider]  Cholecalciferol (VITAMIN D3) 25 MCG (1000 UT) CAPS Take 1 capsule (1,000 Units total) by mouth daily at 6 (six) AM. 05/04/21   Netta Neat., NP  clopidogrel (PLAVIX) 75 MG tablet Take 1 tablet (75 mg total) by mouth daily. 07/25/23 01/21/24  Arty Baumgartner, NP  cyclobenzaprine (FLEXERIL) 5 MG tablet Take 1 tablet (5 mg total) by mouth 3 (three) times daily as needed for muscle spasms. Patient not taking:  Reported on 07/30/2023 05/30/23   Donita Brooks, MD  doxazosin (CARDURA) 1 MG tablet Take 2 mg by mouth daily. 04/25/21   [provider]  ferrous sulfate 325 (65 FE) MG tablet Take 325 mg by mouth daily with breakfast.    [provider]  hydrALAZINE (APRESOLINE) 50 MG tablet TAKE ONE AND ONE-HALF TABLETS (75MG  TOTAL) BY MOUTH THREE TIMES DAILY 06/20/23   Antoine Poche, MD  isosorbide mononitrate (IMDUR) 30 MG 24 hr tablet Take 1 tablet (30 mg total) by mouth daily. 07/08/23 08/07/23  Scarlette Ar, MD  ketorolac (ACULAR) 0.5 % ophthalmic solution Place 1 drop into both eyes in the morning, at noon, and at bedtime. Patient not taking: Reported on 10/15/2023    [provider]  levothyroxine (SYNTHROID) 50 MCG tablet TAKE ONE TABLET ( TOTAL) BY MOUTH DAILY BEFORE BREAKFAST 05/30/23   Donita Brooks, MD  multivitamin (RENA-VIT) TABS tablet Take 1 tablet by mouth daily. 01/30/21   [provider]  pantoprazole (PROTONIX) 40 MG tablet TAKE ONE TABLET BY MOUTH TWICE A DAY 10/08/23   Donita Brooks, MD  predniSONE (DELTASONE) 20 MG tablet Take 2 tablets (40 mg total) by mouth daily with breakfast for 7 days, THEN 1 tablet (20 mg total) daily with breakfast for 5 days, THEN 0.5 tablets (10 mg total) daily with breakfast for 5 days, THEN 0.5  tablets (10 mg total) every other day for 5 days. 10/15/23 11/06/23  Erven Colla, PA-C  sacubitril-valsartan (ENTRESTO) 97-103 MG TAKE ONE TABLET BY MOUTH TWICE A DAY 11/19/22   Antoine Poche, MD  sildenafil (VIAGRA) 100 MG tablet Take 0.5-1 tablets (50-100 mg total) by mouth daily as needed for erectile dysfunction. Patient not taking: Reported on 07/30/2023 06/12/22   Donita Brooks, MD  spironolactone (ALDACTONE) 25 MG tablet Take 0.5 tablets (12.5 mg total) by mouth daily. 01/24/23   Antoine Poche, MD  torsemide (DEMADEX) 20 MG tablet Take 20 mg by mouth daily. 03/29/21   [provider]       Allergies    Patient has no known allergies.    Review of Systems   Review of Systems  Neurological:  Positive for tremors.  All other systems reviewed and are negative.   Physical Exam Updated Vital Signs BP (!) 124/7 (BP Location: Right Arm)   Pulse 63   Temp 98.6 F (37 C) (Oral)   Resp 18   Ht 5\' 8"  (1.727 m)   Wt 59.4 kg   SpO2 100%   BMI 19.91 kg/m  Physical Exam Vitals and nursing note reviewed.  Constitutional:      Appearance: Normal appearance.  HENT:     Head: Normocephalic and atraumatic.     Right Ear: External ear normal.     Left Ear: External ear normal.     Nose: Nose normal.     Mouth/Throat:     Mouth: Mucous membranes are moist.     Pharynx: Oropharynx is clear.  Eyes:     Extraocular Movements: Extraocular movements intact.     Conjunctiva/sclera: Conjunctivae normal.     Pupils: Pupils are equal, round, and reactive to light.  Cardiovascular:     Rate and Rhythm: Normal rate and regular rhythm.     Pulses: Normal pulses.  Pulmonary:     Effort: Pulmonary effort is normal.     Breath sounds: Normal breath sounds.  Abdominal:     General: Abdomen is flat. Bowel sounds are normal.     Palpations: Abdomen is soft.  Musculoskeletal:        General: Normal range of motion.     Cervical back: Normal range of motion and neck supple.  Skin:    General: Skin is warm.     Capillary Refill: Capillary refill takes less than 2 seconds.     Comments: Shingles previously on abd are gone  Neurological:     General: No focal deficit present.     Mental Status: He is alert and oriented to person, place, and time.     Comments: Diffuse tremors  Psychiatric:        Mood and Affect: Mood normal.        Behavior: Behavior normal.     ED Results / Procedures / Treatments   Labs (all labs ordered are listed, but only abnormal results are displayed) Labs Reviewed  CBC WITH DIFFERENTIAL/PLATELET - Abnormal; Notable for the following components:       Result Value   RBC 2.99 (*)    Hemoglobin 10.5 (*)    HCT 31.3 (*)    MCV 104.7 (*)    MCH 35.1 (*)    Platelets 138 (*)    Lymphs Abs 0.6 (*)    All other components within normal limits  COMPREHENSIVE METABOLIC PANEL - Abnormal; Notable for the following components:   Glucose, Bld 157 (*)  BUN 57 (*)    Creatinine, Ser 4.26 (*)    Total Protein 6.0 (*)    Albumin 3.1 (*)    GFR, Estimated 13 (*)    All other components within normal limits  URINALYSIS, ROUTINE W REFLEX MICROSCOPIC - Abnormal; Notable for the following components:   Glucose, UA 150 (*)    Protein, ur 30 (*)    All other components within normal limits  CBG MONITORING, ED - Abnormal; Notable for the following components:   Glucose-Capillary 152 (*)    All other components within normal limits    EKG EKG Interpretation Date/Time:  Monday November 04 2023 11:01:19 EST Ventricular Rate:  52 PR Interval:    QRS Duration:  127 QT Interval:  446 QTC Calculation: 415 R Axis:   72  Text Interpretation: No significant change since last tracing Nonspecific intraventricular conduction delay Inferior infarct, old Abnormal lateral Q waves Minimal ST elevation, anterior leads Confirmed by Jacalyn Lefevre [phone]) on 11/04/2023 12:04:59 PM  Radiology CT Head Wo Contrast Result Date: 11/04/2023 CLINICAL DATA:  Provided history: Head trauma, minor. Neck trauma. Tremors. EXAM: CT HEAD WITHOUT CONTRAST CT CERVICAL SPINE WITHOUT CONTRAST TECHNIQUE: Multidetector CT imaging of the head and cervical spine was performed following the standard protocol without intravenous contrast. Multiplanar CT image reconstructions of the cervical spine were also generated. RADIATION DOSE REDUCTION: This exam was performed according to the departmental dose-optimization program which includes automated exposure control, adjustment of the mA and/or kV according to patient size and/or use of iterative reconstruction technique. COMPARISON:  Brain MRI  07/18/2020.  Cervical spine MRI 06/14/2014. FINDINGS: CT HEAD FINDINGS Brain: Moderate cerebral atrophy. Patchy and ill-defined hypoattenuation within the cerebral white matter, nonspecific but compatible with mild-to-moderate chronic small vessel ischemic disease. There is no acute intracranial hemorrhage. No demarcated cortical infarct. No extra-axial fluid collection. No evidence of an intracranial mass. No midline shift. Vascular: No hyperdense vessel. Atherosclerotic calcifications. Skull: No calvarial fracture or aggressive osseous lesion. Sinuses/Orbits: No mass or acute finding within the imaged orbits. No significant paranasal sinus disease at the imaged levels. CT CERVICAL SPINE FINDINGS Alignment: Slight C3-C4 grade 1 retrolisthesis. 2 mm C4-C5 grade 1 anterolisthesis. 2 mm C5-C6 grade 1 retrolisthesis. Skull base and vertebrae: The basion-dental and atlanto-dental intervals are maintained.No evidence of acute fracture to the cervical spine. 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 Soft tissues and spinal canal: No prevertebral fluid or swelling. No visible canal hematoma. Postoperative changes to the right parotid space and right neck. Disc levels: Cervical spondylosis with multilevel disc space narrowing, disc bulges/central disc protrusions, uncovertebral hypertrophy and facet arthrosis. Disc space narrowing is greatest at C5-C6 (advanced at this level). At C5-C6, a posterior disc osteophyte complex contributes to apparent mild-to-moderate spinal canal stenosis. No more than mild spinal canal stenosis appreciated at the remaining cervical levels. Multilevel bony neural foraminal narrowing. Upper chest: No consolidation within the imaged lung apices. No visible pneumothorax. IMPRESSION: CT head: 1.  No evidence of an acute intracranial abnormality. 2. Parenchymal atrophy and chronic small vessel ischemic disease. CT cervical spine: 1. No evidence of an acute cervical spine fracture. 2. Grade 1  spondylolisthesis at C3-C4, C4-C5 and C5-C6. 3. Cervical spondylosis as described. 4. 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 Electronically Signed   By: Jackey Loge D.O.   On: 11/04/2023 11:24   CT Cervical Spine Wo Contrast Result Date: 11/04/2023 CLINICAL DATA:  Provided history: Head trauma, minor. Neck trauma. Tremors. EXAM: CT HEAD WITHOUT  CONTRAST CT CERVICAL SPINE WITHOUT CONTRAST TECHNIQUE: Multidetector CT imaging of the head and cervical spine was performed following the standard protocol without intravenous contrast. Multiplanar CT image reconstructions of the cervical spine were also generated. RADIATION DOSE REDUCTION: This exam was performed according to the departmental dose-optimization program which includes automated exposure control, adjustment of the mA and/or kV according to patient size and/or use of iterative reconstruction technique. COMPARISON:  Brain MRI 07/18/2020.  Cervical spine MRI 06/14/2014. FINDINGS: CT HEAD FINDINGS Brain: Moderate cerebral atrophy. Patchy and ill-defined hypoattenuation within the cerebral white matter, nonspecific but compatible with mild-to-moderate chronic small vessel ischemic disease. There is no acute intracranial hemorrhage. No demarcated cortical infarct. No extra-axial fluid collection. No evidence of an intracranial mass. No midline shift. Vascular: No hyperdense vessel. Atherosclerotic calcifications. Skull: No calvarial fracture or aggressive osseous lesion. Sinuses/Orbits: No mass or acute finding within the imaged orbits. No significant paranasal sinus disease at the imaged levels. CT CERVICAL SPINE FINDINGS Alignment: Slight C3-C4 grade 1 retrolisthesis. 2 mm C4-C5 grade 1 anterolisthesis. 2 mm C5-C6 grade 1 retrolisthesis. Skull base and vertebrae: The basion-dental and atlanto-dental intervals are maintained.No evidence of acute fracture to the cervical spine. 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 Soft tissues and spinal canal: No prevertebral  fluid or swelling. No visible canal hematoma. Postoperative changes to the right parotid space and right neck. Disc levels: Cervical spondylosis with multilevel disc space narrowing, disc bulges/central disc protrusions, uncovertebral hypertrophy and facet arthrosis. Disc space narrowing is greatest at C5-C6 (advanced at this level). At C5-C6, a posterior disc osteophyte complex contributes to apparent mild-to-moderate spinal canal stenosis. No more than mild spinal canal stenosis appreciated at the remaining cervical levels. Multilevel bony neural foraminal narrowing. Upper chest: No consolidation within the imaged lung apices. No visible pneumothorax. IMPRESSION: CT head: 1.  No evidence of an acute intracranial abnormality. 2. Parenchymal atrophy and chronic small vessel ischemic disease. CT cervical spine: 1. No evidence of an acute cervical spine fracture. 2. Grade 1 spondylolisthesis at C3-C4, C4-C5 and C5-C6. 3. Cervical spondylosis as described. 4. 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 Electronically Signed   By: Jackey Loge D.O.   On: 11/04/2023 11:24   DG Chest 2 View Result Date: 11/04/2023 CLINICAL DATA:  Altered mental status EXAM: CHEST - 2 VIEW COMPARISON:  Chest radiograph dated 07/06/2023 FINDINGS: Normal lung volumes. No focal consolidations. No pleural effusion or pneumothorax. Similar postsurgical cardiomediastinal silhouette. Median sternotomy wires are nondisplced. Right upper quadrant radiopaque tubular structure may represent a vascular stent. IMPRESSION: No active cardiopulmonary disease. Electronically Signed   By: Agustin Cree M.D.   On: 11/04/2023 11:05    Procedures Procedures    Medications Ordered in ED Medications  LORazepam (ATIVAN) injection 1 mg (1 mg Intravenous Given 11/04/23 1057)    ED Course/ Medical Decision Making/ A&P                                 Medical Decision Making Amount and/or Complexity of Data Reviewed Labs: ordered. Radiology:  ordered.  Risk Prescription drug management.   This patient presents to the ED for concern of tremors, this involves an extensive number of treatment options, and is a complaint that carries with it a high risk of complications and morbidity.  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  The pertinent results include:  ua +glucose/protein, cmp with bun 57 and cr 4.26 (chronic), cbc with hgb 10.5/hct 31.3 (chronic)   Imaging Studies ordered:  I ordered imaging studies including cxr, ct head/ct cervical spine  I independently visualized and interpreted imaging which showed  CXR: No active cardiopulmonary disease.  CT head/c-spine: CT head:    1.  No evidence of an acute intracranial abnormality.  2. Parenchymal atrophy and chronic small vessel ischemic disease.    CT cervical spine:    1. No evidence of an acute cervical spine fracture.  2. Grade 1 spondylolisthesis at C3-C4, C4-C5 and C5-C6.  3. Cervical spondylosis as described.  4. 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   I agree with the radiologist interpretation   Cardiac Monitoring:  The patient was maintained on a cardiac monitor.  I personally viewed and interpreted the cardiac monitored which showed an underlying rhythm of: sb   Medicines ordered and prescription drug management:  I ordered medication including ativan  for tremors  Reevaluation of the patient after these  medicines showed that the patient improved I have reviewed the patients home medicines and have made adjustments as needed   Test Considered:  ct  Problem List / ED Course:  Tremors:  etiology unclear.  They are gone now with ativan and he feels much better.  Labs without anything acute.  No signs/sx fevers.  Pt is stable for d/c.  F/u with pcp and neurology.  Return if worse.    Reevaluation:  After the interventions noted above, I reevaluated the patient and found that they have :improved   Social Determinants of Health:  Lives at home   Dispostion:  After consideration of the diagnostic results and the patients response to treatment, I feel that the patent would benefit from discharge with outpatient f/u.          Final Clinical Impression(s) / ED Diagnoses Final diagnoses:  Tremor    Rx / DC Orders ED Discharge Orders          Ordered    LORazepam (ATIVAN) 0.5 MG tablet  2 times daily PRN        11/04/23 1231              Jacalyn Lefevre, MD 11/04/23 1236

## 2023-11-04 NOTE — Telephone Encounter (Signed)
FYI: See CRM msg.

## 2023-11-05 ENCOUNTER — Telehealth: Payer: Self-pay

## 2023-11-05 ENCOUNTER — Inpatient Hospital Stay: Payer: Medicare Other | Admitting: Dietician

## 2023-11-05 ENCOUNTER — Telehealth: Payer: Self-pay | Admitting: Dietician

## 2023-11-05 DIAGNOSIS — N186 End stage renal disease: Secondary | ICD-10-CM | POA: Diagnosis not present

## 2023-11-05 DIAGNOSIS — Z992 Dependence on renal dialysis: Secondary | ICD-10-CM | POA: Diagnosis not present

## 2023-11-05 NOTE — Telephone Encounter (Signed)
Attempted to contact patient for nutrition follow-up. Patient did not answer. Left VM with request for return call.

## 2023-11-05 NOTE — Transitions of Care (Post Inpatient/ED Visit) (Signed)
 11/05/2023  Name: Bradley Black MRN: [id number] DOB: 04/06/1946  Today's TOC FU Call Status: Today's TOC FU Call Status:: Successful TOC FU Call Completed TOC FU Call Complete Date: 11/05/23 Patient's Name and Date of Birth confirmed.  Transition Care Management Follow-up Telephone Call Date of Discharge: 11/03/23 Discharge Facility: [facility] ([facility]) Type of Discharge: Emergency Department Reason for ED Visit: Other: (tremor) How have you been since you were released from the hospital?: Better Any questions or concerns?: No  Items Reviewed: Medications obtained,verified, and reconciled?: Yes (Medications Reviewed) Any new allergies since your discharge?: No Dietary orders reviewed?: Yes Do you have support at home?: Yes People in Home: spouse  Medications Reviewed Today: Medications Reviewed Today     Reviewed by Emmitt Pan, LPN (Licensed Practical Nurse) on 11/05/23 at 1623  Med List Status: <None>   Medication Order Taking? Sig Documenting Provider Last Dose Status Informant  aspirin  EC 81 MG tablet [id number] No Take 81 mg by mouth daily. [provider] Taking Active Self  atorvastatin  (LIPITOR) 40 MG tablet [id number] No Take 1 tablet (40 mg total) by mouth daily. Henry Manuelita NOVAK, NP Taking Active   brimonidine  (ALPHAGAN ) 0.2 % ophthalmic solution [id number] No Place 1 drop into both eyes 2 (two) times daily.  Patient not taking: Reported on 10/15/2023   [provider] Not Taking Active Self  Cholecalciferol (VITAMIN D3) 25 MCG (1000 UT) CAPS [id number] No Take 1 capsule (1,000 Units total) by mouth daily at 6 (six) AM. Quinn, Andrew L Jr., NP Taking Active Self  clopidogrel  (PLAVIX ) 75 MG tablet [id number] No Take 1 tablet (75 mg total) by mouth daily. Henry Manuelita NOVAK, NP Taking Active   cyclobenzaprine  (FLEXERIL ) 5 MG tablet [id number] No Take 1 tablet (5 mg total) by mouth 3 (three) times daily as needed for muscle spasms.  Patient not  taking: Reported on 07/30/2023   Duanne Butler DASEN, MD Not Taking Active Self  doxazosin  (CARDURA ) 1 MG tablet [id number] No Take 2 mg by mouth daily. [provider] Taking Active Self           Med Note LUCIO, JINNIE HERO   Fri Jul 12, 2023  9:30 AM) Will back to confirm dose  ferrous sulfate 325 (65 FE) MG tablet [id number] No Take 325 mg by mouth daily with breakfast. [provider] Taking Active Self  hydrALAZINE  (APRESOLINE ) 50 MG tablet [id number] No TAKE ONE AND ONE-HALF TABLETS (75MG  TOTAL) BY MOUTH THREE TIMES DAILY Branch, Dorn FALCON, MD Taking Active   isosorbide  mononitrate (IMDUR ) 30 MG 24 hr tablet [id number] No Take 1 tablet (30 mg total) by mouth daily. Luciano Standing, MD Taking Expired 08/07/23 2359   ketorolac  (ACULAR ) 0.5 % ophthalmic solution [id number] No Place 1 drop into both eyes in the morning, at noon, and at bedtime.  Patient not taking: Reported on 10/15/2023   [provider] Not Taking Active Self  levothyroxine  (SYNTHROID ) 50 MCG tablet [id number] No TAKE ONE TABLET ( TOTAL) BY MOUTH DAILY BEFORE BREAKFAST Duanne Butler DASEN, MD Taking Active Self  LORazepam  (ATIVAN ) 0.5 MG tablet [id number]  Take 1 tablet (0.5 mg total) by mouth 2 (two) times daily as needed (tremors). Dean Clarity, MD  Active   multivitamin (RENA-VIT) TABS tablet [id number] No Take 1 tablet by mouth daily. [provider] Taking Active Self  pantoprazole  (PROTONIX ) 40 MG tablet [id number] No TAKE ONE TABLET BY MOUTH TWICE A DAY Duanne Butler DASEN, MD Taking Active   predniSONE  (DELTASONE ) 20  MG tablet [id number]  Take 2 tablets (40 mg total) by mouth daily with breakfast for 7 days, THEN 1 tablet (20 mg total) daily with breakfast for 5 days, THEN 0.5 tablets (10 mg total) daily with breakfast for 5 days, THEN 0.5 tablets (10 mg total) every other day for 5 days. Wyatt Leeroy HERO, PA-C  Active   sacubitril -valsartan  (ENTRESTO ) 97-103 MG [id number] No TAKE ONE TABLET BY  MOUTH TWICE A DAY Branch, Dorn FALCON, MD Taking Active Self           Med Note CHRISTIE ALYSON [place] Jun 19, 2023  4:03 PM) Get from manufacture  sildenafil  (VIAGRA ) 100 MG tablet [id number] No Take 0.5-1 tablets (50-100 mg total) by mouth daily as needed for erectile dysfunction.  Patient not taking: Reported on 07/30/2023   Duanne Butler DASEN, MD Not Taking Active Self  spironolactone  (ALDACTONE ) 25 MG tablet [id number] No Take 0.5 tablets (12.5 mg total) by mouth daily. Alvan Dorn FALCON, MD Taking Active Self  torsemide  (DEMADEX ) 20 MG tablet [id number] No Take 20 mg by mouth daily. [provider] Taking Active Self            Home Care and Equipment/Supplies: Were Home Health Services Ordered?: NA Any new equipment or medical supplies ordered?: NA  Functional Questionnaire: Do you need assistance with bathing/showering or dressing?: No Do you need assistance with meal preparation?: No Do you need assistance with eating?: No Do you have difficulty maintaining continence: No Do you need assistance with getting out of bed/getting out of a chair/moving?: No Do you have difficulty managing or taking your medications?: No  Follow up appointments reviewed: PCP Follow-up appointment confirmed?: Yes Date of PCP follow-up appointment?: 11/07/23 Follow-up Provider: [facility] Follow-up appointment confirmed?: NA Do you need transportation to your follow-up appointment?: No Do you understand care options if your condition(s) worsen?: Yes-patient verbalized understanding    SIGNATURE Julian Lemmings, LPN [facility] Nurse Health Advisor Direct Dial [phone]

## 2023-11-06 DIAGNOSIS — N186 End stage renal disease: Secondary | ICD-10-CM | POA: Diagnosis not present

## 2023-11-06 DIAGNOSIS — Z992 Dependence on renal dialysis: Secondary | ICD-10-CM | POA: Diagnosis not present

## 2023-11-07 ENCOUNTER — Encounter: Payer: Self-pay | Admitting: Family Medicine

## 2023-11-07 ENCOUNTER — Ambulatory Visit (INDEPENDENT_AMBULATORY_CARE_PROVIDER_SITE_OTHER): Payer: Medicare Other | Admitting: Family Medicine

## 2023-11-07 VITALS — BP 118/62 | HR 75 | Temp 97.7°F | Ht 68.0 in

## 2023-11-07 DIAGNOSIS — N186 End stage renal disease: Secondary | ICD-10-CM | POA: Diagnosis not present

## 2023-11-07 DIAGNOSIS — R270 Ataxia, unspecified: Secondary | ICD-10-CM

## 2023-11-07 DIAGNOSIS — R251 Tremor, unspecified: Secondary | ICD-10-CM | POA: Diagnosis not present

## 2023-11-07 DIAGNOSIS — Z992 Dependence on renal dialysis: Secondary | ICD-10-CM | POA: Diagnosis not present

## 2023-11-07 NOTE — Progress Notes (Signed)
 Subjective:    Patient ID: Bradley Black, male    DOB: 1946/01/18, 78 y.o.   MRN: [id number] Patient is requesting referral to a neurologist.  Recently, he developed tremors in his hands.  The tremor spread all over his body.  He was standing at the dining room table and fell losing his balance for no reason.  This prompted him to go to the emergency room.  In the emergency room CT scan of the head and neck were unremarkable.  CT scan of the brain did reveal cerebral atrophy as well as chronic small vessel disease.  The patient had degenerative disc disease in the neck but no fractures or serious injuries.  Lab work was unremarkable aside from his elevated creatinine which stems from his dialysis dependent end-stage renal disease.  The patient states he received fluids and Ativan  on the emergency room.  Tremor stopped in emergency room send received Ativan  and has not returned since.  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  He also has some mild nerve damage stemming from this causing his eyelid to droop on the right side.  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  Cranial nerves II through XII are otherwise grossly intact muscle strength 5/5 equal and symmetric in the upper and lower extremities.  The patient has no tremor on his exam today.  Cerebellar testing is normal with regards to finger-to-nose testing.  Romberg testing is normal.  However the patient demonstrates ataxia while walking in the hallway performing heel-to-toe walk.  When he tries to walk in a straight line, he frequently loses his balance and has to reach out and catch himself against the wall. Past Medical History:  Diagnosis Date   BPH (benign prostatic hyperplasia)    CAD S/P percutaneous coronary angioplasty    a. PTCA of OM 1988 & 1994 b. PCI with BMS to LAD in 1997 c. RCA PCI BMS 1999 &  2000 d. s/p CABG in 11/2011 with LIMA-LAD, SVG-PDA, SVG-OM2, and SVG-D1   Cataract    Chronic back pain    CKD (chronic kidney disease), stage III (HCC)    Cyst of bursa    R shoulder   Diabetic retinopathy (HCC)    DM (diabetes mellitus), type 2 with renal complications (HCC)    Essential hypertension    Frequent PVCs    GERD (gastroesophageal reflux disease)    Hypertensive retinopathy    Ischemic cardiomyopathy 11/2011   Intra-OP TEE: EF 40-45%, no regional WMA; improved Anterior WM post CABG.   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   LIMA to LAD, SVG to D1, SVG to OM2, SVG to PDA, EVH via right thigh and leg   Shingles    Past Surgical History:  Procedure Laterality Date   AV FISTULA PLACEMENT Left 07/28/2020   Procedure: LEFT ARM ARTERIOVENOUS (AV) FISTULA  CREATION;  Surgeon: Oris Krystal FALCON, MD;  Location: [facility];  Service: Vascular;  Laterality: Left;   BACK SURGERY  10/01/1968   BIOPSY  01/25/2022   Procedure: BIOPSY;  Surgeon: Shaaron Lamar HERO, MD;  Location: [facility];  Service: Endoscopy;;   CARDIAC CATHETERIZATION  10/02/2011   CATARACT EXTRACTION W/PHACO Left 09/14/2022  Procedure: CATARACT EXTRACTION PHACO AND INTRAOCULAR LENS PLACEMENT (IOC);  Surgeon: Harrie Agent, MD;  Location: [facility];  Service: Ophthalmology;  Laterality: Left;  CDE: 8.66   CATARACT EXTRACTION W/PHACO Right 09/28/2022   Procedure: CATARACT EXTRACTION PHACO AND INTRAOCULAR LENS PLACEMENT (IOC);  Surgeon: Harrie Agent, MD;  Location: [facility];  Service: Ophthalmology;  Laterality: Right;  CDE 7.79   COLONOSCOPY WITH PROPOFOL  N/A 01/25/2022   Procedure: COLONOSCOPY WITH PROPOFOL ;  Surgeon: Shaaron Lamar HERO, MD;  Location: [facility];  Service: Endoscopy;  Laterality: N/A;  10:30am   CORONARY ANGIOPLASTY  10/01/1992   OM   CORONARY ANGIOPLASTY  10/02/1995   LAD   CORONARY ANGIOPLASTY  WITH STENT PLACEMENT  10/01/1997   RCA   CORONARY ANGIOPLASTY WITH STENT PLACEMENT  10/02/1999   RCA   CORONARY ARTERY BYPASS GRAFT  12/27/2011   Procedure: CORONARY ARTERY BYPASS GRAFTING (CABG);  Surgeon: Sudie VEAR Laine, MD;  Location: [facility] OR;  Service: Open Heart Surgery;  Laterality: N/A;  Times four. On pump. Using endoscopically harvested right greater saphenous vein and left internal mammary artery.    CORONARY STENT INTERVENTION N/A 07/25/2023   Procedure: CORONARY STENT INTERVENTION;  Surgeon: Verlin Lonni BIRCH, MD;  Location: [facility]AS[facility] CV LAB;  Service: Cardiovascular;  Laterality: N/A;   ESOPHAGOGASTRODUODENOSCOPY (EGD) WITH PROPOFOL  N/A 01/25/2022   Procedure: ESOPHAGOGASTRODUODENOSCOPY (EGD) WITH PROPOFOL ;  Surgeon: Shaaron Lamar HERO, MD;  [facility];  Service: Endoscopy;  Laterality: N/A;   HERNIA REPAIR Bilateral    INCISION AND DRAINAGE OF WOUND  10/01/2004   axilla   INSERTION OF DIALYSIS CATHETER Right 07/25/2020   Procedure: INSERTION OF DIALYSIS CATHETER;  Surgeon: Kallie Manuelita BROCKS, MD;  Location: [facility];  Service: General;  Laterality: Right;   INTRAOPERATIVE TRANSESOPHAGEAL ECHOCARDIOGRAM  12/27/2011   Global hypokinesis with EF of 40-45%, improved LAD distribution wall motion.   LEFT HEART CATH AND CORS/GRAFTS ANGIOGRAPHY N/A 07/25/2023   Procedure: LEFT HEART CATH AND CORS/GRAFTS ANGIOGRAPHY;  Surgeon: Verlin Lonni BIRCH, MD;  Location: [facility];  Service: Cardiovascular;  Laterality: N/A;   LEFT HEART CATHETERIZATION WITH CORONARY ANGIOGRAM N/A 12/13/2011   Procedure: LEFT HEART CATHETERIZATION WITH CORONARY ANGIOGRAM;  Surgeon: Alm LELON Clay, MD;  Location: [facility] CATH LAB;  Service: Cardiovascular;  Laterality: N/A;   LUMBAR FUSION     MASS EXCISION  10/24/2011   R arm   PAROTIDECTOMY Right 07/05/2023   Procedure: 1. Right radical parotidectomy with wide local excision facial skin 3x3cm, dissection and preservation of facial nerve  (CPT 42415, 11644);  Surgeon: Luciano Standing, MD;  Location: [facility] OR;  Service: ENT;  Laterality: Right;   POLYPECTOMY  01/25/2022   Procedure: POLYPECTOMY;  Surgeon: Shaaron Lamar HERO, MD;  [facility];  Service: Endoscopy;;   RADICAL NECK DISSECTION Right 07/05/2023   Procedure: 2. Right modified radical neck dissection 1-3 (CPT [id number]) 3. Cervicofacial rotation advancement flap 10x10cm for closure of 3x3cm defect (CPT 14040);  Surgeon: Luciano Standing, MD;  Location: [facility] OR;  Service: ENT;  Laterality: Right;   RIGHT HEART CATH N/A 07/12/2020   Procedure: RIGHT HEART CATH;  Surgeon: Claudene Victory LELON, MD;  Location: [facility] INVASIVE CV LAB;  Service: Cardiovascular;  Laterality: N/A;   TRANSESOPHAGEAL ECHOCARDIOGRAM  10/02/2011   Current Outpatient Medications on File Prior to Visit  Medication Sig Dispense Refill   aspirin  EC 81 MG tablet Take 81 mg by mouth daily.     atorvastatin  (LIPITOR) 40 MG tablet Take  1 tablet (40 mg total) by mouth daily. 90 tablet 3   Cholecalciferol (VITAMIN D3) 25 MCG (1000 UT) CAPS Take 1 capsule (1,000 Units total) by mouth daily at 6 (six) AM. 60 capsule 6   clopidogrel  (PLAVIX ) 75 MG tablet Take 1 tablet (75 mg total) by mouth daily. 90 tablet 1   doxazosin  (CARDURA ) 1 MG tablet Take 2 mg by mouth daily.     ferrous sulfate 325 (65 FE) MG tablet Take 325 mg by mouth daily with breakfast.     hydrALAZINE  (APRESOLINE ) 50 MG tablet TAKE ONE AND ONE-HALF TABLETS (75MG  TOTAL) BY MOUTH THREE TIMES DAILY 270 tablet 1   ketorolac  (ACULAR ) 0.5 % ophthalmic solution Place 1 drop into both eyes in the morning, at noon, and at bedtime.     levothyroxine  (SYNTHROID ) 50 MCG tablet TAKE ONE TABLET ( TOTAL) BY MOUTH DAILY BEFORE BREAKFAST 90 tablet 1   LORazepam  (ATIVAN ) 0.5 MG tablet Take 1 tablet (0.5 mg total) by mouth 2 (two) times daily as needed (tremors). 10 tablet 0   multivitamin (RENA-VIT) TABS tablet Take 1 tablet by mouth daily.     pantoprazole  (PROTONIX ) 40  MG tablet TAKE ONE TABLET BY MOUTH TWICE A DAY 180 tablet 0   sacubitril -valsartan  (ENTRESTO ) 97-103 MG TAKE ONE TABLET BY MOUTH TWICE A DAY 60 tablet 3   sildenafil  (VIAGRA ) 100 MG tablet Take 0.5-1 tablets (50-100 mg total) by mouth daily as needed for erectile dysfunction. 60 tablet 1   spironolactone  (ALDACTONE ) 25 MG tablet Take 0.5 tablets (12.5 mg total) by mouth daily. 15 tablet 6   torsemide  (DEMADEX ) 20 MG tablet Take 20 mg by mouth daily.     isosorbide  mononitrate (IMDUR ) 30 MG 24 hr tablet Take 1 tablet (30 mg total) by mouth daily. 30 tablet 1   No current facility-administered medications on file prior to visit.   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  Tobacco Use   Smoking status: Former    Current packs/day: 0.00    Average packs/day: 1 pack/day for 15.0 years (15.0 ttl pk-yrs)    Types: Cigarettes    Start date: 10/02/1971    Quit date: 10/01/1986    Years since quitting: 37.1   Smokeless tobacco: Never   Tobacco comments:    quit about 30 yrs ago  Vaping Use   Vaping status: Never Used  Substance and Sexual Activity   Alcohol use: No   Drug use: No   Sexual activity: Not Currently  Other Topics Concern   Not on file  Social History Narrative   Married,  father of one,  grandfather 1. Works out at [employer] at [employer] roughly 2-3 days a week. He works on a treadmill. He does note having a hard time getting his heart rate up.   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   He quit smoking in 1988, and does not drink alcohol.   Social Drivers of Corporate Investment Banker Strain: Low Risk  (12/06/2022)   Overall Financial Resource Strain (CARDIA)    Difficulty of Paying Living Expenses: Not hard  at all  Food Insecurity: No Food Insecurity (08/06/2023)   Hunger Vital  Sign    Worried About Programme Researcher, Broadcasting/film/video in the Last Year: Never true    Ran Out of Food in the Last Year: Never true  Transportation Needs: No Transportation Needs (08/06/2023)   PRAPARE - Administrator, Civil Service (Medical): No    Lack of Transportation (Non-Medical): No  Physical Activity: Insufficiently Active (12/06/2022)   Exercise Vital Sign    Days of Exercise per Week: 3 days    Minutes of Exercise per Session: 30 min  Stress: No Stress Concern Present (12/06/2022)   [employer] of Occupational Health - Occupational Stress Questionnaire    Feeling of Stress : Not at all  Social Connections: Moderately Integrated (12/06/2022)   Social Connection and Isolation Panel [NHANES]    Frequency of Communication with Friends and Family: More than three times a week    Frequency of Social Gatherings with Friends and Family: More than three times a week    Attends Religious Services: 1 to 4 times per year    Active Member of [employer] or Organizations: No    Attends Banker Meetings: Never    Marital Status: Married  Catering Manager Violence: Not At Risk (12/06/2022)   Humiliation, Afraid, Rape, and Kick questionnaire    Fear of Current or Ex-Partner: No    Emotionally Abused: No    Physically Abused: No    Sexually Abused: No      Review of Systems  All other systems reviewed and are negative.      Objective:   Physical Exam Vitals reviewed.  Constitutional:      Appearance: Normal appearance.  Cardiovascular:     Rate and Rhythm: Normal rate and regular rhythm.     Heart sounds: Normal heart sounds. No murmur heard. Pulmonary:     Effort: Pulmonary effort is normal. No respiratory distress.     Breath sounds: Normal breath sounds. No stridor. No wheezing, rhonchi or rales.  Abdominal:     General: Bowel sounds are normal. There is no distension.     Palpations: Abdomen is  soft.     Tenderness: There is no abdominal tenderness.  Genitourinary:    Prostate: Enlarged and tender.     Rectum: Normal.  Skin:    Findings: No erythema or rash.  Neurological:     General: No focal deficit present.     Mental Status: He is alert and oriented to person, place, and time. Mental status is at baseline.     Cranial Nerves: No cranial nerve deficit.     Sensory: No sensory deficit.     Motor: No weakness.     Coordination: Romberg sign negative. Coordination normal. Finger-Nose-Finger Test normal.     Gait: Gait abnormal.     Deep Tendon Reflexes: Reflexes normal.           Assessment & Plan:  Tremor - Plan: Ambulatory referral to Neurology  Ataxia I believe his tremor may be due to underlying toxic metabolic encephalopathy from his end-stage renal disease.  I believe that with IV fluids, the tremor resolved and has not returned.  I do not believe that this tremor demonstrates any signs of parkinsonism.  I believe his ataxia is likely due to age and multiple medical comorbidities.  I believe the best correction this with physical therapy however the patient declines physical therapy at the present time.  Instead he would like to meet with a neurologist for a second opinion.  The story does  not sound like a seizure in my opinion.  However the patient would like to meet with neurology.

## 2023-11-08 ENCOUNTER — Other Ambulatory Visit: Payer: Self-pay | Admitting: Cardiology

## 2023-11-08 DIAGNOSIS — N186 End stage renal disease: Secondary | ICD-10-CM | POA: Diagnosis not present

## 2023-11-08 DIAGNOSIS — Z992 Dependence on renal dialysis: Secondary | ICD-10-CM | POA: Diagnosis not present

## 2023-11-09 DIAGNOSIS — Z992 Dependence on renal dialysis: Secondary | ICD-10-CM | POA: Diagnosis not present

## 2023-11-09 DIAGNOSIS — N186 End stage renal disease: Secondary | ICD-10-CM | POA: Diagnosis not present

## 2023-11-10 DIAGNOSIS — N186 End stage renal disease: Secondary | ICD-10-CM | POA: Diagnosis not present

## 2023-11-10 DIAGNOSIS — Z992 Dependence on renal dialysis: Secondary | ICD-10-CM | POA: Diagnosis not present

## 2023-11-11 DIAGNOSIS — Z992 Dependence on renal dialysis: Secondary | ICD-10-CM | POA: Diagnosis not present

## 2023-11-11 DIAGNOSIS — N186 End stage renal disease: Secondary | ICD-10-CM | POA: Diagnosis not present

## 2023-11-12 ENCOUNTER — Telehealth: Payer: Self-pay | Admitting: Cardiology

## 2023-11-12 DIAGNOSIS — Z992 Dependence on renal dialysis: Secondary | ICD-10-CM | POA: Diagnosis not present

## 2023-11-12 DIAGNOSIS — N186 End stage renal disease: Secondary | ICD-10-CM | POA: Diagnosis not present

## 2023-11-12 MED ORDER — CLOPIDOGREL BISULFATE 75 MG PO TABS: 75.0000 mg | ORAL_TABLET | Freq: Every day | ORAL | 1 refills | Status: AC

## 2023-11-12 NOTE — Telephone Encounter (Signed)
Filled

## 2023-11-12 NOTE — Telephone Encounter (Signed)
*  STAT* If patient is at the pharmacy, call can be transferred to refill team.   1. Which medications need to be refilled? (please list name of each medication and dose if known) clopidogrel (PLAVIX) 75 MG tablet   2. Which pharmacy/location (including street and city if local pharmacy) is medication to be sent to? [facility] - [place], [place] - [address]   3. Do they need a 30 day or 90 day supply? 90 Pt would like to know if he is to continue taking medication and if so please send in refill to pharmacy

## 2023-11-13 DIAGNOSIS — N186 End stage renal disease: Secondary | ICD-10-CM | POA: Diagnosis not present

## 2023-11-13 DIAGNOSIS — Z992 Dependence on renal dialysis: Secondary | ICD-10-CM | POA: Diagnosis not present

## 2023-11-14 DIAGNOSIS — N186 End stage renal disease: Secondary | ICD-10-CM | POA: Diagnosis not present

## 2023-11-14 DIAGNOSIS — Z992 Dependence on renal dialysis: Secondary | ICD-10-CM | POA: Diagnosis not present

## 2023-11-15 DIAGNOSIS — Z992 Dependence on renal dialysis: Secondary | ICD-10-CM | POA: Diagnosis not present

## 2023-11-15 DIAGNOSIS — N186 End stage renal disease: Secondary | ICD-10-CM | POA: Diagnosis not present

## 2023-11-16 DIAGNOSIS — N186 End stage renal disease: Secondary | ICD-10-CM | POA: Diagnosis not present

## 2023-11-16 DIAGNOSIS — Z992 Dependence on renal dialysis: Secondary | ICD-10-CM | POA: Diagnosis not present

## 2023-11-17 DIAGNOSIS — N186 End stage renal disease: Secondary | ICD-10-CM | POA: Diagnosis not present

## 2023-11-17 DIAGNOSIS — Z992 Dependence on renal dialysis: Secondary | ICD-10-CM | POA: Diagnosis not present

## 2023-11-18 DIAGNOSIS — N186 End stage renal disease: Secondary | ICD-10-CM | POA: Diagnosis not present

## 2023-11-18 DIAGNOSIS — Z992 Dependence on renal dialysis: Secondary | ICD-10-CM | POA: Diagnosis not present

## 2023-11-19 DIAGNOSIS — Z131 Encounter for screening for diabetes mellitus: Secondary | ICD-10-CM | POA: Diagnosis not present

## 2023-11-19 DIAGNOSIS — Z992 Dependence on renal dialysis: Secondary | ICD-10-CM | POA: Diagnosis not present

## 2023-11-19 DIAGNOSIS — N186 End stage renal disease: Secondary | ICD-10-CM | POA: Diagnosis not present

## 2023-11-19 DIAGNOSIS — R634 Abnormal weight loss: Secondary | ICD-10-CM | POA: Diagnosis not present

## 2023-11-20 DIAGNOSIS — Z992 Dependence on renal dialysis: Secondary | ICD-10-CM | POA: Diagnosis not present

## 2023-11-20 DIAGNOSIS — N186 End stage renal disease: Secondary | ICD-10-CM | POA: Diagnosis not present

## 2023-11-21 ENCOUNTER — Other Ambulatory Visit: Payer: Self-pay | Admitting: Family Medicine

## 2023-11-21 DIAGNOSIS — Z992 Dependence on renal dialysis: Secondary | ICD-10-CM | POA: Diagnosis not present

## 2023-11-21 DIAGNOSIS — I10 Essential (primary) hypertension: Secondary | ICD-10-CM

## 2023-11-21 DIAGNOSIS — N186 End stage renal disease: Secondary | ICD-10-CM | POA: Diagnosis not present

## 2023-11-22 DIAGNOSIS — N186 End stage renal disease: Secondary | ICD-10-CM | POA: Diagnosis not present

## 2023-11-22 DIAGNOSIS — Z992 Dependence on renal dialysis: Secondary | ICD-10-CM | POA: Diagnosis not present

## 2023-11-23 DIAGNOSIS — Z992 Dependence on renal dialysis: Secondary | ICD-10-CM | POA: Diagnosis not present

## 2023-11-23 DIAGNOSIS — N186 End stage renal disease: Secondary | ICD-10-CM | POA: Diagnosis not present

## 2023-11-24 DIAGNOSIS — Z992 Dependence on renal dialysis: Secondary | ICD-10-CM | POA: Diagnosis not present

## 2023-11-24 DIAGNOSIS — N186 End stage renal disease: Secondary | ICD-10-CM | POA: Diagnosis not present

## 2023-11-25 DIAGNOSIS — N186 End stage renal disease: Secondary | ICD-10-CM | POA: Diagnosis not present

## 2023-11-25 DIAGNOSIS — Z992 Dependence on renal dialysis: Secondary | ICD-10-CM | POA: Diagnosis not present

## 2023-11-26 ENCOUNTER — Encounter: Payer: Self-pay | Admitting: *Deleted

## 2023-11-26 DIAGNOSIS — N186 End stage renal disease: Secondary | ICD-10-CM | POA: Diagnosis not present

## 2023-11-26 DIAGNOSIS — Z992 Dependence on renal dialysis: Secondary | ICD-10-CM | POA: Diagnosis not present

## 2023-11-27 ENCOUNTER — Ambulatory Visit: Payer: Medicare Other | Attending: Cardiology | Admitting: Cardiology

## 2023-11-27 ENCOUNTER — Encounter: Payer: Self-pay | Admitting: Cardiology

## 2023-11-27 VITALS — BP 124/74 | HR 71 | Ht 68.0 in | Wt 132.0 lb

## 2023-11-27 DIAGNOSIS — Z992 Dependence on renal dialysis: Secondary | ICD-10-CM | POA: Diagnosis not present

## 2023-11-27 DIAGNOSIS — I6529 Occlusion and stenosis of unspecified carotid artery: Secondary | ICD-10-CM | POA: Diagnosis not present

## 2023-11-27 DIAGNOSIS — I5022 Chronic systolic (congestive) heart failure: Secondary | ICD-10-CM | POA: Diagnosis not present

## 2023-11-27 DIAGNOSIS — R42 Dizziness and giddiness: Secondary | ICD-10-CM

## 2023-11-27 DIAGNOSIS — I2510 Atherosclerotic heart disease of native coronary artery without angina pectoris: Secondary | ICD-10-CM | POA: Diagnosis not present

## 2023-11-27 DIAGNOSIS — N186 End stage renal disease: Secondary | ICD-10-CM | POA: Diagnosis not present

## 2023-11-27 NOTE — Patient Instructions (Addendum)
 Medication Instructions:  Your physician has recommended you make the following change in your medication:  Stop taking Isosorbide  Continue taking all other medications   Labwork: None  Testing/Procedures: Your physician has requested that you have a carotid duplex. This test is an ultrasound of the carotid arteries in your neck. It looks at blood flow through these arteries that supply the brain with blood. Allow one hour for this exam. There are no restrictions or special instructions.   Follow-Up: Your physician recommends that you schedule a follow-up appointment in: 6 months  Any Other Special Instructions Will Be Listed Below (If Applicable). Thank you for choosing [facility]!     If you need a refill on your cardiac medications before your next appointment, please call your pharmacy.

## 2023-11-27 NOTE — Progress Notes (Signed)
 Clinical Summary Bradley Black is a 78 y.o.male seen today for follow up of the following medical problems.    1. CAD - multiple prior interventions with subsequent CABG in 11/2011 with LIMA-LAD, SVG-PDA, SVG-OM2, and SVG-D1     - admit 07/2023 for parotid gland resection, surgery was on 10/4. Had postop chest pain.  - elevated troponin, seen by cards inpatient. Plans were for outpatient cath once recovered from surgery 07/2023 echo: LVEF 40-45%,  - no recurrent chest pains   - 07/2023 cath: LAD CTO, ostial LCX 99%, OM2 CTO, RCA CTO. SVG-RPDA 99%, LIMA-LAD normal, SVG-D1 CTO, SVG-OM2 normal.  DES to SVG-PDA.  - no chest pains, no SOB/DOE - compliant with meds   2. Dizziness - episode few weeks ago felt very weak while walking around his home, episode of syncope - has had prior orthostatic symptoms, at a prior visit his SBP dropped 30 points with standing - working to stay hydrated. On mulitple meds than can exacerbate orthostatic hypotension.        3. Chronic combine systolic/diastolic HF -10/2018 echo: LVEF 45% - 07/2020 echo LVEF 25% - 11/2020 echo LVEF 35-40%, grade I dd, mild RV dysfunction.   - not on beta blocker due to bradycardia - no SGLT2i since in setting of ESRD  - 07/2023 echo: LVEF 40-45%,    - no recent edema, no SOB/DOE   4. PVCs Recent monitor last year showed an 18% PVC burden and he was ultimately started on Amiodarone 200mg  daily -no recent palpitations - from notes from [facility]ws amio stopped due to abnormal thyroid studies   - no recent palpitaitons.    5. Carotid stenosis 05/2020 RICA mild, left moderate disease 08/2022 RICA 1-39%, LICA 40-59% - due for repeat US   6. 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 PTCA  of OM 1988 & 1994 b. PCI with BMS to LAD in 1997 c. RCA PCI BMS 1999 & 2000 d. s/p CABG in 11/2011 with LIMA-LAD, SVG-PDA, SVG-OM2, and SVG-D1   Cataract    Chronic back pain    CKD (chronic kidney disease), stage III (HCC)    Cyst of bursa    R shoulder   Diabetic retinopathy (HCC)    DM (diabetes mellitus), type 2 with renal complications (HCC)    Essential hypertension    Frequent PVCs    GERD (gastroesophageal reflux disease)    Hypertensive retinopathy    Ischemic cardiomyopathy 11/2011   Intra-OP TEE: EF 40-45%, no regional WMA; improved Anterior WM post CABG.   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     atorvastatin (LIPITOR) 40 MG tablet Take 1  tablet (40 mg total) by mouth daily. 90 tablet 3   Cholecalciferol (VITAMIN D3) 25 MCG (1000 UT) CAPS Take 1 capsule (1,000 Units total) by mouth daily at 6 (six) AM. 60 capsule 6   clopidogrel (PLAVIX) 75 MG tablet Take 1 tablet (75 mg total) by mouth daily. 90 tablet 1   doxazosin (CARDURA) 1 MG tablet Take 2 mg by mouth daily.     ferrous sulfate 325 (65 FE) MG tablet Take 325 mg by mouth daily with breakfast.     hydrALAZINE (APRESOLINE) 50 MG tablet TAKE ONE AND ONE-HALF TABLETS (75MG  TOTAL) BY MOUTH THREE TIMES DAILY 270 tablet 1   isosorbide mononitrate (IMDUR) 30 MG 24 hr tablet Take 1 tablet (30 mg total) by mouth daily. 30 tablet 1   ketorolac (ACULAR) 0.5 % ophthalmic solution Place 1 drop into both eyes in the morning, at noon, and at bedtime.     levothyroxine (SYNTHROID) 50 MCG tablet TAKE ONE  TABLET ( TOTAL) BY MOUTH DAILY BEFORE BREAKFAST 90 tablet 1   LORazepam (ATIVAN) 0.5 MG tablet Take 1 tablet (0.5 mg total) by mouth 2 (two) times daily as needed (tremors). 10 tablet 0   multivitamin (RENA-VIT) TABS tablet Take 1 tablet by mouth daily.     pantoprazole (PROTONIX) 40 MG tablet TAKE ONE TABLET BY MOUTH TWICE A DAY 180 tablet 0   sacubitril-valsartan (ENTRESTO) 97-103 MG TAKE ONE TABLET BY MOUTH TWICE A DAY 60 tablet 3   sildenafil (VIAGRA) 100 MG tablet Take 0.5-1 tablets (50-100 mg total) by mouth daily as needed for erectile dysfunction. 60 tablet 1   spironolactone (ALDACTONE) 25 MG tablet TAKE 0.5 TABLETS (12.5 MG TOTAL) BY MOUTH DAILY 15 tablet 6   torsemide (DEMADEX) 20 MG tablet Take 20 mg by mouth daily.     No current facility-administered medications for this visit.     Past Surgical History:  Procedure Laterality Date   AV FISTULA PLACEMENT Left 07/28/2020   Procedure: LEFT ARM ARTERIOVENOUS (AV) FISTULA  CREATION;  Surgeon: Larina Earthly, MD;  Location: [facility];  Service: Vascular;  Laterality: Left;   BACK SURGERY  10/01/1968   BIOPSY  01/25/2022   Procedure: BIOPSY;  Surgeon: Corbin Ade, MD;  Location: [facility];  Service: Endoscopy;;   CARDIAC CATHETERIZATION  10/02/2011   CATARACT EXTRACTION W/PHACO Left 09/14/2022   Procedure: CATARACT EXTRACTION PHACO AND INTRAOCULAR LENS PLACEMENT (IOC);  Surgeon: Fabio Pierce, MD;  Location: [facility];  Service: Ophthalmology;  Laterality: Left;  CDE: 8.66   CATARACT EXTRACTION W/PHACO Right 09/28/2022   Procedure: CATARACT EXTRACTION PHACO AND INTRAOCULAR LENS PLACEMENT (IOC);  Surgeon: Fabio Pierce, MD;  Location: [facility];  Service: Ophthalmology;  Laterality: Right;  CDE 7.79   COLONOSCOPY WITH PROPOFOL N/A 01/25/2022   Procedure: COLONOSCOPY WITH PROPOFOL;  Surgeon: Corbin Ade, MD;  Location: [facility];  Service: Endoscopy;  Laterality: N/A;  10:30am   CORONARY ANGIOPLASTY  10/01/1992   OM    CORONARY ANGIOPLASTY  10/02/1995   LAD   CORONARY ANGIOPLASTY WITH STENT PLACEMENT  10/01/1997   RCA   CORONARY ANGIOPLASTY WITH STENT PLACEMENT  10/02/1999   RCA   CORONARY ARTERY BYPASS GRAFT  12/27/2011   Procedure: CORONARY ARTERY BYPASS GRAFTING (CABG);  Surgeon: Purcell Nails, MD;  Location: [facility] OR;  Service: Open Heart Surgery;  Laterality: N/A;  Times four. On pump. Using endoscopically harvested right greater saphenous vein and left internal mammary artery.    CORONARY  STENT INTERVENTION N/A 07/25/2023   Procedure: CORONARY STENT INTERVENTION;  Surgeon: Kathleene Hazel, MD;  Location: [facility];  Service: Cardiovascular;  Laterality: N/A;   ESOPHAGOGASTRODUODENOSCOPY (EGD) WITH PROPOFOL N/A 01/25/2022   Procedure: ESOPHAGOGASTRODUODENOSCOPY (EGD) WITH PROPOFOL;  Surgeon: Corbin Ade, MD;  Location: [facility];  Service: Endoscopy;  Laterality: N/A;   HERNIA REPAIR Bilateral    INCISION AND DRAINAGE OF WOUND  10/01/2004   axilla   INSERTION OF DIALYSIS CATHETER Right 07/25/2020   Procedure: INSERTION OF DIALYSIS CATHETER;  Surgeon: Lucretia Roers, MD;  Location: [facility];  Service: General;  Laterality: Right;   INTRAOPERATIVE TRANSESOPHAGEAL ECHOCARDIOGRAM  12/27/2011   Global hypokinesis with EF of 40-45%, improved LAD distribution wall motion.   LEFT HEART CATH AND CORS/GRAFTS ANGIOGRAPHY N/A 07/25/2023   Procedure: LEFT HEART CATH AND CORS/GRAFTS ANGIOGRAPHY;  Surgeon: Kathleene Hazel, MD;  Location: [facility];  Service: Cardiovascular;  Laterality: N/A;   LEFT HEART CATHETERIZATION WITH CORONARY ANGIOGRAM N/A 12/13/2011   Procedure: LEFT HEART CATHETERIZATION WITH CORONARY ANGIOGRAM;  Surgeon: Marykay Lex, MD;  Location: [facility] CATH LAB;  Service: Cardiovascular;  Laterality: N/A;   LUMBAR FUSION     MASS EXCISION  10/24/2011   R arm   PAROTIDECTOMY Right 07/05/2023   Procedure: 1. Right radical parotidectomy with wide local excision  facial skin 3x3cm, dissection and preservation of facial nerve (CPT 42415, 11644);  Surgeon: Scarlette Ar, MD;  Location: [facility] OR;  Service: ENT;  Laterality: Right;   POLYPECTOMY  01/25/2022   Procedure: POLYPECTOMY;  Surgeon: Corbin Ade, MD;  [facility]: [facility];  Service: Endoscopy;;   RADICAL NECK DISSECTION Right 07/05/2023   Procedure: 2. Right modified radical neck dissection 1-3 (CPT 4806177149) 3. Cervicofacial rotation advancement flap 10x10cm for closure of 3x3cm defect (CPT 14040);  Surgeon: Scarlette Ar, MD;  Location: [facility] OR;  Service: ENT;  Laterality: Right;   RIGHT HEART CATH N/A 07/12/2020   Procedure: RIGHT HEART CATH;  Surgeon: Lyn Records, MD;  Location: [facility] INVASIVE CV LAB;  Service: Cardiovascular;  Laterality: N/A;   TRANSESOPHAGEAL ECHOCARDIOGRAM  10/02/2011     No Known Allergies    Family History  Problem Relation Age of Onset   Cancer Mother        breast   Cancer Father        bone   Sim cancer Neg Hx      Social History Mr. Demicco reports that he quit smoking about 37 years ago. His smoking use included cigarettes. He started smoking about 52 years ago. He has a 15 pack-year smoking history. He has never used smokeless tobacco. Mr. Panik reports no history of alcohol use.      Physical Examination Today's Vitals   11/27/23 1416  BP: 124/74  Pulse: 71  SpO2: 98%  Weight: 132 lb (59.9 kg)  Height: 5\' 8"  (1.727 m)   Body mass index is 20.07 kg/m.  Gen: resting comfortably, no acute distress HEENT: no scleral icterus, pupils equal round and reactive, no palptable cervical adenopathy,  CV: RRR, no mrg, no jvd Resp: Clear to auscultation bilaterally GI: abdomen is soft, non-tender, non-distended, normal bowel sounds, no hepatosplenomegaly MSK: extremities are warm, no edema.  Skin: warm, no rash Neuro:  no focal deficits Psych: appropriate affect   Diagnostic Studies     Assessment and Plan   1. CAD/postoperative  NSTEMI -no recent symptoms, continue current meds. Can stop plavix after October  of this year - he favors stopping imdur so can use viagra, we will d/c imdur. Other antianginals can be used/adjusted if needed.    2. Dizziness/weakness - ongoing symptoms - severely orthostatic at prior visit SBP dropped  30 points with standing.  - stop imdur, continue to work on hydration  3. Chronic HFrEF - euvolemic, fluid managed per peritoneal HD and also on oral diuretic - continue current meds   Antoine Poche, M.D.

## 2023-11-28 DIAGNOSIS — N186 End stage renal disease: Secondary | ICD-10-CM | POA: Diagnosis not present

## 2023-11-28 DIAGNOSIS — Z992 Dependence on renal dialysis: Secondary | ICD-10-CM | POA: Diagnosis not present

## 2023-11-29 DIAGNOSIS — Z992 Dependence on renal dialysis: Secondary | ICD-10-CM | POA: Diagnosis not present

## 2023-11-29 DIAGNOSIS — N186 End stage renal disease: Secondary | ICD-10-CM | POA: Diagnosis not present

## 2023-11-30 ENCOUNTER — Other Ambulatory Visit: Payer: Self-pay | Admitting: Family Medicine

## 2023-11-30 DIAGNOSIS — Z992 Dependence on renal dialysis: Secondary | ICD-10-CM | POA: Diagnosis not present

## 2023-11-30 DIAGNOSIS — N186 End stage renal disease: Secondary | ICD-10-CM | POA: Diagnosis not present

## 2023-12-01 DIAGNOSIS — Z992 Dependence on renal dialysis: Secondary | ICD-10-CM | POA: Diagnosis not present

## 2023-12-01 DIAGNOSIS — N186 End stage renal disease: Secondary | ICD-10-CM | POA: Diagnosis not present

## 2023-12-02 DIAGNOSIS — Z992 Dependence on renal dialysis: Secondary | ICD-10-CM | POA: Diagnosis not present

## 2023-12-02 DIAGNOSIS — N186 End stage renal disease: Secondary | ICD-10-CM | POA: Diagnosis not present

## 2023-12-03 DIAGNOSIS — Z992 Dependence on renal dialysis: Secondary | ICD-10-CM | POA: Diagnosis not present

## 2023-12-03 DIAGNOSIS — N186 End stage renal disease: Secondary | ICD-10-CM | POA: Diagnosis not present

## 2023-12-04 DIAGNOSIS — N186 End stage renal disease: Secondary | ICD-10-CM | POA: Diagnosis not present

## 2023-12-04 DIAGNOSIS — Z992 Dependence on renal dialysis: Secondary | ICD-10-CM | POA: Diagnosis not present

## 2023-12-05 DIAGNOSIS — N186 End stage renal disease: Secondary | ICD-10-CM | POA: Diagnosis not present

## 2023-12-05 DIAGNOSIS — Z992 Dependence on renal dialysis: Secondary | ICD-10-CM | POA: Diagnosis not present

## 2023-12-06 DIAGNOSIS — N186 End stage renal disease: Secondary | ICD-10-CM | POA: Diagnosis not present

## 2023-12-06 DIAGNOSIS — Z992 Dependence on renal dialysis: Secondary | ICD-10-CM | POA: Diagnosis not present

## 2023-12-07 DIAGNOSIS — N186 End stage renal disease: Secondary | ICD-10-CM | POA: Diagnosis not present

## 2023-12-07 DIAGNOSIS — Z992 Dependence on renal dialysis: Secondary | ICD-10-CM | POA: Diagnosis not present

## 2023-12-08 DIAGNOSIS — Z992 Dependence on renal dialysis: Secondary | ICD-10-CM | POA: Diagnosis not present

## 2023-12-08 DIAGNOSIS — N186 End stage renal disease: Secondary | ICD-10-CM | POA: Diagnosis not present

## 2023-12-09 DIAGNOSIS — N186 End stage renal disease: Secondary | ICD-10-CM | POA: Diagnosis not present

## 2023-12-09 DIAGNOSIS — Z992 Dependence on renal dialysis: Secondary | ICD-10-CM | POA: Diagnosis not present

## 2023-12-10 DIAGNOSIS — Z992 Dependence on renal dialysis: Secondary | ICD-10-CM | POA: Diagnosis not present

## 2023-12-10 DIAGNOSIS — N186 End stage renal disease: Secondary | ICD-10-CM | POA: Diagnosis not present

## 2023-12-11 DIAGNOSIS — N186 End stage renal disease: Secondary | ICD-10-CM | POA: Diagnosis not present

## 2023-12-11 DIAGNOSIS — Z992 Dependence on renal dialysis: Secondary | ICD-10-CM | POA: Diagnosis not present

## 2023-12-12 ENCOUNTER — Ambulatory Visit: Payer: Medicare Other | Attending: Cardiology

## 2023-12-12 ENCOUNTER — Ambulatory Visit (INDEPENDENT_AMBULATORY_CARE_PROVIDER_SITE_OTHER): Payer: Medicare Other | Admitting: *Deleted

## 2023-12-12 DIAGNOSIS — Z0000 Encounter for general adult medical examination without abnormal findings: Secondary | ICD-10-CM | POA: Diagnosis not present

## 2023-12-12 DIAGNOSIS — I6529 Occlusion and stenosis of unspecified carotid artery: Secondary | ICD-10-CM

## 2023-12-12 DIAGNOSIS — Z992 Dependence on renal dialysis: Secondary | ICD-10-CM | POA: Diagnosis not present

## 2023-12-12 DIAGNOSIS — I6523 Occlusion and stenosis of bilateral carotid arteries: Secondary | ICD-10-CM | POA: Diagnosis not present

## 2023-12-12 DIAGNOSIS — N186 End stage renal disease: Secondary | ICD-10-CM | POA: Diagnosis not present

## 2023-12-12 NOTE — Patient Instructions (Signed)
 Mr. Bradley Black , Thank you for taking time to come for your Medicare Wellness Visit. I appreciate your ongoing commitment to your health goals. Please review the following plan we discussed and let me know if I can assist you in the future.   Screening recommendations/referrals: Colonoscopy: no longer required Recommended yearly ophthalmology/optometry visit for glaucoma screening and checkup Recommended yearly dental visit for hygiene and checkup  Vaccinations: Influenza vaccine: up to date Pneumococcal vaccine: up to date Tdap vaccine: Education provided Shingles vaccine: Education provided      Preventive Care 65 Years and Older, Male Preventive care refers to lifestyle choices and visits with your health care provider that can promote health and wellness. What does preventive care include? A yearly physical exam. This is also called an annual well check. Dental exams once or twice a year. Routine eye exams. Ask your health care provider how often you should have your eyes checked. Personal lifestyle choices, including: Daily care of your teeth and gums. Regular physical activity. Eating a healthy diet. Avoiding tobacco and drug use. Limiting alcohol use. Practicing safe sex. Taking low doses of aspirin every day. Taking vitamin and mineral supplements as recommended by your health care provider. What happens during an annual well check? The services and screenings done by your health care provider during your annual well check will depend on your age, overall health, lifestyle risk factors, and family history of disease. Counseling  Your health care provider may ask you questions about your: Alcohol use. Tobacco use. Drug use. Emotional well-being. Home and relationship well-being. Sexual activity. Eating habits. History of falls. Memory and ability to understand (cognition). Work and work Astronomer. Screening  You may have the following tests or measurements: Height,  weight, and BMI. Blood pressure. Lipid and cholesterol levels. These may be checked every 5 years, or more frequently if you are over 6 years old. Skin check. Lung cancer screening. You may have this screening every year starting at age 80 if you have a 30-pack-year history of smoking and currently smoke or have quit within the past 15 years. Fecal occult blood test (FOBT) of the stool. You may have this test every year starting at age 66. Flexible sigmoidoscopy or colonoscopy. You may have a sigmoidoscopy every 5 years or a colonoscopy every 10 years starting at age 97. Prostate cancer screening. Recommendations will vary depending on your family history and other risks. Hepatitis C blood test. Hepatitis B blood test. Sexually transmitted disease (STD) testing. Diabetes screening. This is done by checking your blood sugar (glucose) after you have not eaten for a while (fasting). You may have this done every 1-3 years. Abdominal aortic aneurysm (AAA) screening. You may need this if you are a current or former smoker. Osteoporosis. You may be screened starting at age 41 if you are at high risk. Talk with your health care provider about your test results, treatment options, and if necessary, the need for more tests. Vaccines  Your health care provider may recommend certain vaccines, such as: Influenza vaccine. This is recommended every year. Tetanus, diphtheria, and acellular pertussis (Tdap, Td) vaccine. You may need a Td booster every 10 years. Zoster vaccine. You may need this after age 80. Pneumococcal 13-valent conjugate (PCV13) vaccine. One dose is recommended after age 35. Pneumococcal polysaccharide (PPSV23) vaccine. One dose is recommended after age 73. Talk to your health care provider about which screenings and vaccines you need and how often you need them. This information is not intended to replace  advice given to you by your health care provider. Make sure you discuss any  questions you have with your health care provider. Document Released: 10/14/2015 Document Revised: 06/06/2016 Document Reviewed: 07/19/2015 Elsevier Interactive Patient Education  2017 [employer].  Fall Prevention in the Home Falls can cause injuries. They can happen to people of all ages. There are many things you can do to make your home safe and to help prevent falls. What can I do on the outside of my home? Regularly fix the edges of walkways and driveways and fix any cracks. Remove anything that might make you trip as you walk through a door, such as a raised step or threshold. Trim any bushes or trees on the path to your home. Use bright outdoor lighting. Clear any walking paths of anything that might make someone trip, such as rocks or tools. Regularly check to see if handrails are loose or broken. Make sure that both sides of any steps have handrails. Any raised decks and porches should have guardrails on the edges. Have any leaves, snow, or ice cleared regularly. Use sand or salt on walking paths during winter. Clean up any spills in your garage right away. This includes oil or grease spills. What can I do in the bathroom? Use night lights. Install grab bars by the toilet and in the tub and shower. Do not use towel bars as grab bars. Use non-skid mats or decals in the tub or shower. If you need to sit down in the shower, use a plastic, non-slip stool. Keep the floor dry. Clean up any water that spills on the floor as soon as it happens. Remove soap buildup in the tub or shower regularly. Attach bath mats securely with double-sided non-slip rug tape. Do not have throw rugs and other things on the floor that can make you trip. What can I do in the bedroom? Use night lights. Make sure that you have a light by your bed that is easy to reach. Do not use any sheets or blankets that are too big for your bed. They should not hang down onto the floor. Have a firm chair that has side  arms. You can use this for support while you get dressed. Do not have throw rugs and other things on the floor that can make you trip. What can I do in the kitchen? Clean up any spills right away. Avoid walking on wet floors. Keep items that you use a lot in easy-to-reach places. If you need to reach something above you, use a strong step stool that has a grab bar. Keep electrical cords out of the way. Do not use floor polish or wax that makes floors slippery. If you must use wax, use non-skid floor wax. Do not have throw rugs and other things on the floor that can make you trip. What can I do with my stairs? Do not leave any items on the stairs. Make sure that there are handrails on both sides of the stairs and use them. Fix handrails that are broken or loose. Make sure that handrails are as long as the stairways. Check any carpeting to make sure that it is firmly attached to the stairs. Fix any carpet that is loose or worn. Avoid having throw rugs at the top or bottom of the stairs. If you do have throw rugs, attach them to the floor with carpet tape. Make sure that you have a light switch at the top of the stairs and the bottom  of the stairs. If you do not have them, ask someone to add them for you. What else can I do to help prevent falls? Wear shoes that: Do not have high heels. Have rubber bottoms. Are comfortable and fit you well. Are closed at the toe. Do not wear sandals. If you use a stepladder: Make sure that it is fully opened. Do not climb a closed stepladder. Make sure that both sides of the stepladder are locked into place. Ask someone to hold it for you, if possible. Clearly mark and make sure that you can see: Any grab bars or handrails. First and last steps. Where the edge of each step is. Use tools that help you move around (mobility aids) if they are needed. These include: Canes. Walkers. Scooters. Crutches. Turn on the lights when you go into a dark area.  Replace any light bulbs as soon as they burn out. Set up your furniture so you have a clear path. Avoid moving your furniture around. If any of your floors are uneven, fix them. If there are any pets around you, be aware of where they are. Review your medicines with your doctor. Some medicines can make you feel dizzy. This can increase your chance of falling. Ask your doctor what other things that you can do to help prevent falls. This information is not intended to replace advice given to you by your health care provider. Make sure you discuss any questions you have with your health care provider. Document Released: 07/14/2009 Document Revised: 02/23/2016 Document Reviewed: 10/22/2014 Elsevier Interactive Patient Education  2017 [employer].

## 2023-12-12 NOTE — Progress Notes (Signed)
 Subjective:   Bradley Black is a 78 y.o. male who presents for Medicare Annual/Subsequent preventive examination.  Visit Complete: Virtual I connected with  Adewale Glade Lloyd on 12/12/23 by a audio enabled telemedicine application and verified that I am speaking with the correct person using two identifiers.  Patient Location: Home  Provider Location: Home Office  I discussed the limitations of evaluation and management by telemedicine. The patient expressed understanding and agreed to proceed.  Vital Signs: Because this visit was a virtual/telehealth visit, some criteria may be missing or patient reported. Any vitals not documented were not able to be obtained and vitals that have been documented are patient reported.   Cardiac Risk Factors include: advanced age (>25men, >86 women);male gender;hypertension;diabetes mellitus     Objective:    Today's Vitals   12/12/23 1138  PainSc: 4    There is no height or weight on file to calculate BMI.     12/12/2023   11:45 AM 11/04/2023   10:07 AM 10/15/2023    3:00 PM 10/15/2023    2:59 PM 08/22/2023   10:15 AM 08/06/2023   10:33 AM 07/25/2023    8:10 AM  Advanced Directives  Does Patient Have a Medical Advance Directive? No No No No No No No  Would patient like information on creating a medical advance directive? No - Patient declined No - Patient declined No - Patient declined No - Guardian declined No - Patient declined No - Patient declined No - Patient declined    Current Medications (verified) Outpatient Encounter Medications as of 12/12/2023  Medication Sig   amLODipine (NORVASC) 5 MG tablet TAKE ONE TABLET (5MG  TOTAL) BY MOUTH DAILY   aspirin EC 81 MG tablet Take 81 mg by mouth daily.   atorvastatin (LIPITOR) 40 MG tablet Take 1 tablet (40 mg total) by mouth daily.   Cholecalciferol (VITAMIN D3) 25 MCG (1000 UT) CAPS Take 1 capsule (1,000 Units total) by mouth daily at 6 (six) AM.   clopidogrel (PLAVIX) 75 MG tablet Take 1  tablet (75 mg total) by mouth daily.   doxazosin (CARDURA) 1 MG tablet Take 2 mg by mouth daily.   ferrous sulfate 325 (65 FE) MG tablet Take 325 mg by mouth daily with breakfast.   hydrALAZINE (APRESOLINE) 50 MG tablet TAKE ONE AND ONE-HALF TABLETS (75MG  TOTAL) BY MOUTH THREE TIMES DAILY   ketorolac (ACULAR) 0.5 % ophthalmic solution Place 1 drop into both eyes in the morning, at noon, and at bedtime.   levothyroxine (SYNTHROID) 50 MCG tablet TAKE ONE TABLET ( TOTAL) BY MOUTH DAILY BEFORE BREAKFAST   LORazepam (ATIVAN) 0.5 MG tablet Take 1 tablet (0.5 mg total) by mouth 2 (two) times daily as needed (tremors).   multivitamin (RENA-VIT) TABS tablet Take 1 tablet by mouth daily.   pantoprazole (PROTONIX) 40 MG tablet TAKE ONE TABLET BY MOUTH TWICE A DAY   sacubitril-valsartan (ENTRESTO) 97-103 MG TAKE ONE TABLET BY MOUTH TWICE A DAY   sildenafil (VIAGRA) 100 MG tablet Take 0.5-1 tablets (50-100 mg total) by mouth daily as needed for erectile dysfunction.   spironolactone (ALDACTONE) 25 MG tablet TAKE 0.5 TABLETS (12.5 MG TOTAL) BY MOUTH DAILY   torsemide (DEMADEX) 20 MG tablet Take 20 mg by mouth daily.   No facility-administered encounter medications on file as of 12/12/2023.    Allergies (verified) Patient has no known allergies.   History: Past Medical History:  Diagnosis Date   BPH (benign prostatic hyperplasia)    CAD S/P  percutaneous coronary angioplasty    a. PTCA of OM 1988 & 1994 b. PCI with BMS to LAD in 1997 c. RCA PCI BMS 1999 & 2000 d. s/p CABG in 11/2011 with LIMA-LAD, SVG-PDA, SVG-OM2, and SVG-D1   Cataract    Chronic back pain    CKD (chronic kidney disease), stage III (HCC)    Cyst of bursa    R shoulder   Diabetic retinopathy (HCC)    DM (diabetes mellitus), type 2 with renal complications (HCC)    Essential hypertension    Frequent PVCs    GERD (gastroesophageal reflux disease)    Hypertensive retinopathy    Ischemic cardiomyopathy 11/2011   Intra-OP TEE:  EF 40-45%, no regional WMA; improved Anterior WM post CABG.   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   LIMA to LAD, SVG to D1, SVG to OM2, SVG to PDA, EVH via right thigh and leg   Shingles    Past Surgical History:  Procedure Laterality Date   AV FISTULA PLACEMENT Left 07/28/2020   Procedure: LEFT ARM ARTERIOVENOUS (AV) FISTULA  CREATION;  Surgeon: Larina Earthly, MD;  Location: [facility];  Service: Vascular;  Laterality: Left;   BACK SURGERY  10/01/1968   BIOPSY  01/25/2022   Procedure: BIOPSY;  Surgeon: Corbin Ade, MD;  Location: [facility];  Service: Endoscopy;;   CARDIAC CATHETERIZATION  10/02/2011   CATARACT EXTRACTION W/PHACO Left 09/14/2022   Procedure: CATARACT EXTRACTION PHACO AND INTRAOCULAR LENS PLACEMENT (IOC);  Surgeon: Fabio Pierce, MD;  Location: [facility];  Service: Ophthalmology;  Laterality: Left;  CDE: 8.66   CATARACT EXTRACTION W/PHACO Right 09/28/2022   Procedure: CATARACT EXTRACTION PHACO AND INTRAOCULAR LENS PLACEMENT (IOC);  Surgeon: Fabio Pierce, MD;  Location: [facility];  Service: Ophthalmology;  Laterality: Right;  CDE 7.79   COLONOSCOPY WITH PROPOFOL N/A 01/25/2022   Procedure: COLONOSCOPY WITH PROPOFOL;  Surgeon: Corbin Ade, MD;  [facility]: [facility];  Service: Endoscopy;  Laterality: N/A;  10:30am   CORONARY ANGIOPLASTY  10/01/1992   OM   CORONARY ANGIOPLASTY  10/02/1995   LAD   CORONARY ANGIOPLASTY WITH STENT PLACEMENT  10/01/1997   RCA   CORONARY ANGIOPLASTY WITH STENT PLACEMENT  10/02/1999   RCA   CORONARY ARTERY BYPASS GRAFT  12/27/2011   Procedure: CORONARY ARTERY BYPASS GRAFTING (CABG);  Surgeon: Purcell Nails, MD;  Location: [facility] OR;  Service: Open Heart Surgery;  Laterality: N/A;  Times four. On pump. Using endoscopically harvested right greater saphenous vein and left internal mammary artery.    CORONARY STENT INTERVENTION N/A  07/25/2023   Procedure: CORONARY STENT INTERVENTION;  Surgeon: Kathleene Hazel, MD;  Location: [facility] INVAS[facility] CV LAB;  Service: Cardiovascular;  Laterality: N/A;   ESOPHAGOGASTRODUODENOSCOPY (EGD) WITH PROPOFOL N/A 01/25/2022   Procedure: ESOPHAGOGASTRODUODENOSCOPY (EGD) WITH PROPOFOL;  Surgeon: Corbin Ade, MD;  [facility];  Service: Endoscopy;  Laterality: N/A;   HERNIA REPAIR Bilateral    INCISION AND DRAINAGE OF WOUND  10/01/2004   axilla   INSERTION OF DIALYSIS CATHETER Right 07/25/2020   Procedure: INSERTION OF DIALYSIS CATHETER;  Surgeon: Lucretia Roers, MD;  Location: [facility];  Service: General;  Laterality: Right;   INTRAOPERATIVE TRANSESOPHAGEAL ECHOCARDIOGRAM  12/27/2011   Global hypokinesis with EF of 40-45%, improved LAD distribution wall motion.   LEFT HEART CATH AND CORS/GRAFTS  ANGIOGRAPHY N/A 07/25/2023   Procedure: LEFT HEART CATH AND CORS/GRAFTS ANGIOGRAPHY;  Surgeon: Kathleene Hazel, MD;  [facility]: [facility];  Service: Cardiovascular;  Laterality: N/A;   LEFT HEART CATHETERIZATION WITH CORONARY ANGIOGRAM N/A 12/13/2011   Procedure: LEFT HEART CATHETERIZATION WITH CORONARY ANGIOGRAM;  Surgeon: Marykay Lex, MD;  Location[facility] CATH LAB;  Service: Cardiovascular;  Laterality: N/A;   LUMBAR FUSION     MASS EXCISION  10/24/2011   R arm   PAROTIDECTOMY Right 07/05/2023   Procedure: 1. Right radical parotidectomy with wide local excision facial skin 3x3cm, dissection and preservation of facial nerve (CPT 42415, 11644);  Surgeon: Scarlette Ar, MD;  Location: [facility] OR;  Service: ENT;  Laterality: Right;   POLYPECTOMY  01/25/2022   Procedure: POLYPECTOMY;  Surgeon: Corbin Ade, MD;  [facility];  Service: Endoscopy;;   RADICAL NECK DISSECTION Right 07/05/2023   Procedure: 2. Right modified radical neck dissection 1-3 (CPT [phone]) 3. Cervicofacial rotation advancement flap 10x10cm for closure of 3x3cm defect (CPT 14040);  Surgeon:  Scarlette Ar, MD;  Location: [facility] OR;  Service: ENT;  Laterality: Right;   RIGHT HEART CATH N/A 07/12/2020   Procedure: RIGHT HEART CATH;  Surgeon: Lyn Records, MD;  Location: [facility] INVASIVE CV LAB;  Service: Cardiovascular;  Laterality: N/A;   TRANSESOPHAGEAL ECHOCARDIOGRAM  10/02/2011   Family History  Problem Relation Age of Onset   Cancer Mother        breast   Cancer Father        bone   Hardy cancer Neg Hx    Social History   Socioeconomic History   Marital status: Married    Spouse name: Meriam Sprague   Number of children: 1   Years of education: college   Highest education level: Bachelor's degree (e.g., BA, AB, BS)  Occupational History   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  Tobacco Use   Smoking status: Former    Current packs/day: 0.00    Average packs/day: 1 pack/day for 15.0 years (15.0 ttl pk-yrs)    Types: Cigarettes    Start date: 10/02/1971    Quit date: 10/01/1986    Years since quitting: 37.2   Smokeless tobacco: Never   Tobacco comments:    quit about 30 yrs ago  Vaping Use   Vaping status: Never Used  Substance and Sexual Activity   Alcohol use: No   Drug use: No   Sexual activity: Not Currently  Other Topics Concern   Not on file  Social History Narrative   Married,  father of one,  grandfather 1. Works out at [employer] at [employer] roughly 2-3 days a week. He works on a treadmill. He does note having a hard time getting his heart rate up.   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   He quit smoking in 1988, and does not drink alcohol.   Social Drivers of Corporate investment banker Strain: Low Risk  (12/12/2023)   Overall Financial Resource Strain (CARDIA)    Difficulty of Paying Living Expenses: Not hard at all  Food Insecurity: No Food Insecurity (12/12/2023)   Hunger Vital Sign    Worried About Running Out of Food in the Last Year: Never true    Ran Out of Food in the Last Year: Never true  Transportation Needs: No  Transportation Needs (12/12/2023)   PRAPARE - Administrator, Civil Service (Medical): No  Lack of Transportation (Non-Medical): No  Physical Activity: Sufficiently Active (12/12/2023)   Exercise Vital Sign    Days of Exercise per Week: 4 days    Minutes of Exercise per Session: 40 min  Stress: No Stress Concern Present (12/12/2023)   [employer] of Occupational Health - Occupational Stress Questionnaire    Feeling of Stress : Not at all  Social Connections: Moderately Integrated (12/12/2023)   Social Connection and Isolation Panel [NHANES]    Frequency of Communication with Friends and Family: More than three times a week    Frequency of Social Gatherings with Friends and Family: Once a week    Attends Religious Services: More than 4 times per year    Active Member of [employer] or Organizations: No    Attends Engineer, structural: Never    Marital Status: Married    Tobacco Counseling Counseling given: Not Answered Tobacco comments: quit about 30 yrs ago   Clinical Intake:  Pre-visit preparation completed: Yes  Pain : 0-10 Pain Score: 4  Pain Location: Back Pain Descriptors / Indicators: Burning, Aching, Dull, Grimacing Pain Onset: More than a month ago Pain Frequency: Constant     Diabetes: Yes CBG done?: No Did pt. bring in CBG monitor from home?: No  How often do you need to have someone help you when you read instructions, pamphlets, or other written materials from your doctor or pharmacy?: 1 - Never  Interpreter Needed?: No  Information entered by :: Remi Haggard LPN   Activities of Daily Living    12/12/2023   11:44 AM 07/01/2023   10:53 AM  In your present state of health, do you have any difficulty performing the following activities:  Hearing? 1   Vision? 0   Difficulty concentrating or making decisions? 0   Walking or climbing stairs? 0   Dressing or bathing? 0   Doing errands, shopping? 0 0  Preparing Food and eating ? N    Using the Toilet? N   In the past six months, have you accidently leaked urine? N   Do you have problems with loss of bowel control? N   Managing your Medications? N   Managing your Finances? N   Housekeeping or managing your Housekeeping? N     Patient Care Team: Donita Brooks, MD as PCP - General (Family Medicine) Hillis Range, MD (Inactive) as PCP - Electrophysiology (Cardiology) Wyline Mood Dorothe Pea, MD as PCP - Cardiology (Cardiology) Jena Gauss Gerrit Friends, MD as Attending Physician (Gastroenterology) Bensimhon, Bevelyn Buckles, MD as Consulting Physician (Cardiology) Randa Lynn, MD as Consulting Physician (Nephrology) Molli Hazard Thomasene Ripple, NP as Nurse Practitioner (Cardiology) Early, Kristen Loader, MD (Inactive) as Consulting Physician (Vascular Surgery) Dialysis, [facility] Harlin Rain, MD as Consulting Physician (Nephrology) Dr Desiree [facility], OD (Optometry) Malmfelt, Lise Auer, RN as Oncology Nurse Navigator Lonie Peak, MD as Consulting Physician (Radiation Oncology) Scarlette Ar, MD as Consulting Physician (Otolaryngology)  Indicate any recent Medical Services you may have received from other than [facility] providers in the past year (date may be approximate).     Assessment:   This is a routine wellness examination for Keshun.  Hearing/Vision screen Hearing Screening - Comments:: Yes  Does not wear hearing aids Vision Screening - Comments:: Up to date [facility]   Goals Addressed             This Visit's Progress    Patient Stated       Continue current lifestyle  Depression Screen    12/12/2023   11:46 AM 10/03/2023    2:49 PM 08/06/2023   10:37 AM 12/06/2022    2:20 PM 07/23/2022   11:11 AM 11/30/2021    2:17 PM 03/20/2021    9:11 AM  PHQ 2/9 Scores  PHQ - 2 Score 0 2 2 0 0 1 2  PHQ- 9 Score 5 8 12    10     Fall Risk    12/12/2023   11:39 AM 12/06/2022    2:19 PM 07/23/2022   11:11 AM 05/09/2022   11:33 AM 05/02/2022     2:00 PM  Fall Risk   Falls in the past year? 1 0 0 0 0  Number falls in past yr: 0 0  0   Injury with Fall? 0 0     Risk for fall due to :  No Fall Risks  No Fall Risks   Follow up Education provided;Falls evaluation completed;Falls prevention discussed Falls prevention discussed;Education provided;Falls evaluation completed  Falls evaluation completed     MEDICARE RISK AT HOME: Medicare Risk at Home Any stairs in or around the home?: Yes If so, are there any without handrails?: No Home free of loose throw rugs in walkways, pet beds, electrical cords, etc?: Yes Adequate lighting in your home to reduce risk of falls?: Yes Life alert?: No Use of a cane, walker or w/c?: No Grab bars in the bathroom?: No Shower chair or bench in shower?: No Elevated toilet seat or a handicapped toilet?: No  TIMED UP AND GO:  Was the test performed?  No    Cognitive Function:        12/12/2023   11:41 AM 12/06/2022    2:22 PM 11/30/2021    2:26 PM  6CIT Screen  What Year? 0 points 0 points 0 points  What month? 0 points 0 points 0 points  What time? 0 points 0 points 0 points  Count back from 20 4 points 0 points 0 points  Months in reverse 4 points 0 points 0 points  Repeat phrase 2 points 0 points 0 points  Total Score 10 points 0 points 0 points    Immunizations Immunization History  Administered Date(s) Administered   Fluad Quad(high Dose 65+) 07/10/2020   Hepatitis B, ADULT 09/17/2020, 10/18/2020, 11/17/2020, 04/07/2021   Hepatitis B, PED/ADOLESCENT 09/17/2020, 10/18/2020   Influenza Split 07/12/2015, 08/10/2016, 07/01/2017, 07/29/2019   Influenza, High Dose Seasonal PF 08/10/2016, 07/31/2017, 07/29/2019, 07/10/2020, 07/18/2022   Influenza,inj,Quad PF,6+ Mos 07/12/2015   Influenza,inj,quad, With Preservative 07/01/2017   Influenza-Unspecified 07/12/2015, 08/10/2016, 07/01/2017, 07/29/2019, 06/20/2020, 07/06/2021   PFIZER(Purple Top)SARS-COV-2 Vaccination 11/07/2019, 11/28/2019   PPD  Test 09/15/2020   Pneumococcal Conjugate-13 02/12/2013, 04/19/2015   Pneumococcal Polysaccharide-23 02/12/2013   Pneumococcal-Unspecified 02/12/2013, 04/19/2015   Tdap 10/01/2001   Zoster, Live 03/04/2007    TDAP status: Due, Education has been provided regarding the importance of this vaccine. Advised may receive this vaccine at local pharmacy or Health Dept. Aware to provide a copy of the vaccination record if obtained from local pharmacy or Health Dept. Verbalized acceptance and understanding.  Flu Vaccine status: Up to date  Pneumococcal vaccine status: Up to date  Covid-19 vaccine status: Information provided on how to obtain vaccines.   Qualifies for Shingles Vaccine? Yes   Zostavax completed No   Shingrix Completed?: No.    Education has been provided regarding the importance of this vaccine. Patient has been advised to call insurance company to determine out of pocket  expense if they have not yet received this vaccine. Advised may also receive vaccine at local pharmacy or Health Dept. Verbalized acceptance and understanding.  Screening Tests Health Maintenance  Topic Date Due   Zoster Vaccines- Shingrix (1 of 2) 10/25/1964   COVID-19 Vaccine (3 - Pfizer risk series) 12/26/2019   FOOT EXAM  04/30/2020   HEMOGLOBIN A1C  11/21/2023   OPHTHALMOLOGY EXAM  12/05/2023   Medicare Annual Wellness (AWV)  12/11/2024   Pneumonia Vaccine 53+ Years old  Completed   INFLUENZA VACCINE  Completed   HPV VACCINES  Aged Out   DTaP/Tdap/Td  Discontinued   Hepatitis C Screening  Discontinued   Fecal DNA (Cologuard)  Discontinued    Health Maintenance  Health Maintenance Due  Topic Date Due   Zoster Vaccines- Shingrix (1 of 2) 10/25/1962   COVID-19 Vaccine (3 - Pfizer risk series) 12/26/2019   FOOT EXAM  04/30/2020   HEMOGLOBIN A1C  11/21/2023   OPHTHALMOLOGY EXAM  12/05/2023    Colorectal cancer screening: No longer required.   Lung Cancer Screening: (Low Dose CT Chest recommended  if Age 6-80 years, 20 pack-year currently smoking OR have quit w/in 15years.) does not qualify.   Lung Cancer Screening Referral:   Additional Screening:  Hepatitis C Screening: does not qualify; Completed 2021  Vision Screening: Recommended annual ophthalmology exams for early detection of glaucoma and other disorders of the eye. Is the patient up to date with their annual eye exam?  Yes  Who is the provider or what is the name of the office in which the patient attends annual eye exams? [facility] If pt is not established with a provider, would they like to be referred to a provider to establish care? No .   Dental Screening: Recommended annual dental exams for proper oral hygiene  Nutrition Risk Assessment:  Has the patient had any N/V/D within the last 2 months?  No  Does the patient have any non-healing wounds?  No  Has the patient had any unintentional weight loss or weight gain?  No   Diabetes:  Is the patient diabetic?  Yes  If diabetic, was a CBG obtained today?  No  Did the patient bring in their glucometer from home?  No  How often do you monitor your CBG's? 1 x a day.   Financial Strains and Diabetes Management:  Are you having any financial strains with the device, your supplies or your medication? No .  Does the patient want to be seen by Chronic Care Management for management of their diabetes?  No  Would the patient like to be referred to a Nutritionist or for Diabetic Management?  No   Diabetic Exams:  Diabetic Eye Exam: Completed .Pt has been advised about the importance in completing this exam..  Diabetic Foot Exam: Completed . Pt has been advised about the importance in completing this exam..    Community Resource Referral / Chronic Care Management: CRR required this visit?  No   CCM required this visit?  No     Plan:     I have personally reviewed and noted the following in the patient's chart:   Medical and social history Use of alcohol,  tobacco or illicit drugs  Current medications and supplements including opioid prescriptions. Patient is currently taking opioid prescriptions. Information provided to patient regarding non-opioid alternatives. Patient advised to discuss non-opioid treatment plan with their provider. Functional ability and status Nutritional status Physical activity Advanced directives List of other physicians Hospitalizations, surgeries,  and ER visits in previous 12 months Vitals Screenings to include cognitive, depression, and falls Referrals and appointments  In addition, I have reviewed and discussed with patient certain preventive protocols, quality metrics, and best practice recommendations. A written personalized care plan for preventive services as well as general preventive health recommendations were provided to patient.     Remi Haggard, LPN   1/61/0960   After Visit Summary: (MyChart) Due to this being a telephonic visit, the after visit summary with patients personalized plan was offered to patient via MyChart   Nurse Notes:

## 2023-12-13 DIAGNOSIS — Z992 Dependence on renal dialysis: Secondary | ICD-10-CM | POA: Diagnosis not present

## 2023-12-13 DIAGNOSIS — N186 End stage renal disease: Secondary | ICD-10-CM | POA: Diagnosis not present

## 2023-12-14 DIAGNOSIS — Z992 Dependence on renal dialysis: Secondary | ICD-10-CM | POA: Diagnosis not present

## 2023-12-14 DIAGNOSIS — N186 End stage renal disease: Secondary | ICD-10-CM | POA: Diagnosis not present

## 2023-12-15 DIAGNOSIS — Z992 Dependence on renal dialysis: Secondary | ICD-10-CM | POA: Diagnosis not present

## 2023-12-15 DIAGNOSIS — N186 End stage renal disease: Secondary | ICD-10-CM | POA: Diagnosis not present

## 2023-12-16 ENCOUNTER — Encounter (INDEPENDENT_AMBULATORY_CARE_PROVIDER_SITE_OTHER): Payer: Medicare Other | Admitting: Ophthalmology

## 2023-12-16 DIAGNOSIS — Z992 Dependence on renal dialysis: Secondary | ICD-10-CM | POA: Diagnosis not present

## 2023-12-16 DIAGNOSIS — N186 End stage renal disease: Secondary | ICD-10-CM | POA: Diagnosis not present

## 2023-12-17 DIAGNOSIS — Z992 Dependence on renal dialysis: Secondary | ICD-10-CM | POA: Diagnosis not present

## 2023-12-17 DIAGNOSIS — N186 End stage renal disease: Secondary | ICD-10-CM | POA: Diagnosis not present

## 2023-12-18 DIAGNOSIS — Z992 Dependence on renal dialysis: Secondary | ICD-10-CM | POA: Diagnosis not present

## 2023-12-18 DIAGNOSIS — N186 End stage renal disease: Secondary | ICD-10-CM | POA: Diagnosis not present

## 2023-12-19 DIAGNOSIS — Z992 Dependence on renal dialysis: Secondary | ICD-10-CM | POA: Diagnosis not present

## 2023-12-19 DIAGNOSIS — N186 End stage renal disease: Secondary | ICD-10-CM | POA: Diagnosis not present

## 2023-12-20 DIAGNOSIS — Z992 Dependence on renal dialysis: Secondary | ICD-10-CM | POA: Diagnosis not present

## 2023-12-20 DIAGNOSIS — N186 End stage renal disease: Secondary | ICD-10-CM | POA: Diagnosis not present

## 2023-12-21 DIAGNOSIS — Z992 Dependence on renal dialysis: Secondary | ICD-10-CM | POA: Diagnosis not present

## 2023-12-21 DIAGNOSIS — N186 End stage renal disease: Secondary | ICD-10-CM | POA: Diagnosis not present

## 2023-12-22 DIAGNOSIS — N186 End stage renal disease: Secondary | ICD-10-CM | POA: Diagnosis not present

## 2023-12-22 DIAGNOSIS — Z992 Dependence on renal dialysis: Secondary | ICD-10-CM | POA: Diagnosis not present

## 2023-12-23 DIAGNOSIS — Z992 Dependence on renal dialysis: Secondary | ICD-10-CM | POA: Diagnosis not present

## 2023-12-23 DIAGNOSIS — N186 End stage renal disease: Secondary | ICD-10-CM | POA: Diagnosis not present

## 2023-12-24 DIAGNOSIS — N186 End stage renal disease: Secondary | ICD-10-CM | POA: Diagnosis not present

## 2023-12-24 DIAGNOSIS — Z992 Dependence on renal dialysis: Secondary | ICD-10-CM | POA: Diagnosis not present

## 2023-12-25 DIAGNOSIS — Z992 Dependence on renal dialysis: Secondary | ICD-10-CM | POA: Diagnosis not present

## 2023-12-25 DIAGNOSIS — N186 End stage renal disease: Secondary | ICD-10-CM | POA: Diagnosis not present

## 2023-12-26 DIAGNOSIS — Z992 Dependence on renal dialysis: Secondary | ICD-10-CM | POA: Diagnosis not present

## 2023-12-26 DIAGNOSIS — N186 End stage renal disease: Secondary | ICD-10-CM | POA: Diagnosis not present

## 2023-12-27 DIAGNOSIS — N186 End stage renal disease: Secondary | ICD-10-CM | POA: Diagnosis not present

## 2023-12-27 DIAGNOSIS — Z992 Dependence on renal dialysis: Secondary | ICD-10-CM | POA: Diagnosis not present

## 2023-12-28 DIAGNOSIS — N186 End stage renal disease: Secondary | ICD-10-CM | POA: Diagnosis not present

## 2023-12-28 DIAGNOSIS — Z992 Dependence on renal dialysis: Secondary | ICD-10-CM | POA: Diagnosis not present

## 2023-12-29 DIAGNOSIS — Z992 Dependence on renal dialysis: Secondary | ICD-10-CM | POA: Diagnosis not present

## 2023-12-29 DIAGNOSIS — N186 End stage renal disease: Secondary | ICD-10-CM | POA: Diagnosis not present

## 2023-12-30 DIAGNOSIS — N186 End stage renal disease: Secondary | ICD-10-CM | POA: Diagnosis not present

## 2023-12-30 DIAGNOSIS — Z992 Dependence on renal dialysis: Secondary | ICD-10-CM | POA: Diagnosis not present

## 2023-12-31 DIAGNOSIS — Z992 Dependence on renal dialysis: Secondary | ICD-10-CM | POA: Diagnosis not present

## 2023-12-31 DIAGNOSIS — N186 End stage renal disease: Secondary | ICD-10-CM | POA: Diagnosis not present

## 2024-01-01 DIAGNOSIS — N186 End stage renal disease: Secondary | ICD-10-CM | POA: Diagnosis not present

## 2024-01-01 DIAGNOSIS — Z992 Dependence on renal dialysis: Secondary | ICD-10-CM | POA: Diagnosis not present

## 2024-01-02 DIAGNOSIS — N186 End stage renal disease: Secondary | ICD-10-CM | POA: Diagnosis not present

## 2024-01-02 DIAGNOSIS — Z992 Dependence on renal dialysis: Secondary | ICD-10-CM | POA: Diagnosis not present

## 2024-01-03 DIAGNOSIS — Z992 Dependence on renal dialysis: Secondary | ICD-10-CM | POA: Diagnosis not present

## 2024-01-03 DIAGNOSIS — N186 End stage renal disease: Secondary | ICD-10-CM | POA: Diagnosis not present

## 2024-01-04 DIAGNOSIS — N186 End stage renal disease: Secondary | ICD-10-CM | POA: Diagnosis not present

## 2024-01-04 DIAGNOSIS — Z992 Dependence on renal dialysis: Secondary | ICD-10-CM | POA: Diagnosis not present

## 2024-01-05 DIAGNOSIS — N186 End stage renal disease: Secondary | ICD-10-CM | POA: Diagnosis not present

## 2024-01-05 DIAGNOSIS — Z992 Dependence on renal dialysis: Secondary | ICD-10-CM | POA: Diagnosis not present

## 2024-01-06 ENCOUNTER — Other Ambulatory Visit: Payer: Self-pay | Admitting: Family Medicine

## 2024-01-06 ENCOUNTER — Telehealth: Payer: Self-pay | Admitting: *Deleted

## 2024-01-06 DIAGNOSIS — Z992 Dependence on renal dialysis: Secondary | ICD-10-CM | POA: Diagnosis not present

## 2024-01-06 DIAGNOSIS — N186 End stage renal disease: Secondary | ICD-10-CM | POA: Diagnosis not present

## 2024-01-06 NOTE — Telephone Encounter (Signed)
 Called patient to inform of CT for 01-13-24- arrival time- 4:15 pm, no restrictions to scan, patient to have CT for 01-20-24- arrival time- 1:45 pm @ [facility], no restrictions to scan, patient to receive results from Dr. Basilio Cairo on 01-24-24 @ 3 pm, spoke with patient and he is aware of these scans and the instructions

## 2024-01-07 DIAGNOSIS — N186 End stage renal disease: Secondary | ICD-10-CM | POA: Diagnosis not present

## 2024-01-07 DIAGNOSIS — Z992 Dependence on renal dialysis: Secondary | ICD-10-CM | POA: Diagnosis not present

## 2024-01-07 NOTE — Telephone Encounter (Signed)
 Requested Prescriptions  Pending Prescriptions Disp Refills   pantoprazole (PROTONIX) 40 MG tablet [Pharmacy Med Name: PANTOPRAZOLE SODIUM 40 MG DR TAB] 180 tablet 0    Sig: TAKE ONE TABLET BY MOUTH TWICE A DAY     Gastroenterology: Proton Pump Inhibitors Passed - 01/07/2024 12:54 PM      Passed - Valid encounter within last 12 months    Recent Outpatient Visits           2 months ago Tremor   [place] [facility] Medicine Donita Brooks, MD   3 months ago Herpes zoster with other complication   [place] [facility] Medicine Donita Brooks, MD   7 months ago Mass of right parotid gland   [place] [facility] Donita Brooks, MD   8 months ago Parotid mass   [place] [facility] [place], FNP   1 year ago Dysuria   [place] [facility] [place], [place]

## 2024-01-08 DIAGNOSIS — N186 End stage renal disease: Secondary | ICD-10-CM | POA: Diagnosis not present

## 2024-01-08 DIAGNOSIS — Z992 Dependence on renal dialysis: Secondary | ICD-10-CM | POA: Diagnosis not present

## 2024-01-09 DIAGNOSIS — N186 End stage renal disease: Secondary | ICD-10-CM | POA: Diagnosis not present

## 2024-01-09 DIAGNOSIS — Z992 Dependence on renal dialysis: Secondary | ICD-10-CM | POA: Diagnosis not present

## 2024-01-10 DIAGNOSIS — N186 End stage renal disease: Secondary | ICD-10-CM | POA: Diagnosis not present

## 2024-01-10 DIAGNOSIS — Z992 Dependence on renal dialysis: Secondary | ICD-10-CM | POA: Diagnosis not present

## 2024-01-11 DIAGNOSIS — Z992 Dependence on renal dialysis: Secondary | ICD-10-CM | POA: Diagnosis not present

## 2024-01-11 DIAGNOSIS — N186 End stage renal disease: Secondary | ICD-10-CM | POA: Diagnosis not present

## 2024-01-12 DIAGNOSIS — N186 End stage renal disease: Secondary | ICD-10-CM | POA: Diagnosis not present

## 2024-01-12 DIAGNOSIS — Z992 Dependence on renal dialysis: Secondary | ICD-10-CM | POA: Diagnosis not present

## 2024-01-13 ENCOUNTER — Ambulatory Visit (HOSPITAL_COMMUNITY)
Admission: RE | Admit: 2024-01-13 | Discharge: 2024-01-13 | Disposition: A | Discharge status: Home / Self Care | Source: Ambulatory Visit | Attending: Radiology | Admitting: Radiology

## 2024-01-13 ENCOUNTER — Encounter (HOSPITAL_COMMUNITY): Payer: Self-pay

## 2024-01-13 DIAGNOSIS — I6523 Occlusion and stenosis of bilateral carotid arteries: Secondary | ICD-10-CM | POA: Diagnosis not present

## 2024-01-13 DIAGNOSIS — N186 End stage renal disease: Secondary | ICD-10-CM | POA: Diagnosis not present

## 2024-01-13 DIAGNOSIS — I672 Cerebral atherosclerosis: Secondary | ICD-10-CM | POA: Diagnosis not present

## 2024-01-13 DIAGNOSIS — I700 Atherosclerosis of aorta: Secondary | ICD-10-CM | POA: Diagnosis not present

## 2024-01-13 DIAGNOSIS — C07 Malignant neoplasm of parotid gland: Secondary | ICD-10-CM | POA: Diagnosis not present

## 2024-01-13 DIAGNOSIS — Z992 Dependence on renal dialysis: Secondary | ICD-10-CM | POA: Diagnosis not present

## 2024-01-13 DIAGNOSIS — K118 Other diseases of salivary glands: Secondary | ICD-10-CM | POA: Diagnosis not present

## 2024-01-14 DIAGNOSIS — N186 End stage renal disease: Secondary | ICD-10-CM | POA: Diagnosis not present

## 2024-01-14 DIAGNOSIS — Z992 Dependence on renal dialysis: Secondary | ICD-10-CM | POA: Diagnosis not present

## 2024-01-15 DIAGNOSIS — N186 End stage renal disease: Secondary | ICD-10-CM | POA: Diagnosis not present

## 2024-01-15 DIAGNOSIS — Z992 Dependence on renal dialysis: Secondary | ICD-10-CM | POA: Diagnosis not present

## 2024-01-16 DIAGNOSIS — N186 End stage renal disease: Secondary | ICD-10-CM | POA: Diagnosis not present

## 2024-01-16 DIAGNOSIS — Z992 Dependence on renal dialysis: Secondary | ICD-10-CM | POA: Diagnosis not present

## 2024-01-17 DIAGNOSIS — Z992 Dependence on renal dialysis: Secondary | ICD-10-CM | POA: Diagnosis not present

## 2024-01-17 DIAGNOSIS — N186 End stage renal disease: Secondary | ICD-10-CM | POA: Diagnosis not present

## 2024-01-17 NOTE — Progress Notes (Signed)
 Mr. Bradley Black presents to the clinic today for a follow up. 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  CT Chest WO Contrast ON 01/13/2024     Pain issues, if any: Patient has 5 out of 10 pain while swallowing and chewing in his upper jaw muscles Using a feeding tube?: None Weight changes, if any: Weight has stayed stable Swallowing issues, if any: Patient denies Smoking or chewing tobacco? Patient denies Using fluoride toothpaste daily? Yes Last ENT visit was on: January 2025 and will see next time in May 2025 Other notable issues, if any:  None   BP (!) 151/68 (BP Location: Right Arm, Patient Position: Sitting, Cuff Size: Small)   Pulse (!) 57   Temp 97.8 F (36.6 C) (Oral)   Resp 15   Ht 5\' 8"  (1.727 m)   Wt 133 lb 12.8 oz (60.7 kg)   SpO2 100%   BMI 20.34 kg/m     Wt Readings from Last 3 Encounters:  01/24/24 133 lb 12.8 oz (60.7 kg)  11/27/23 132 lb (59.9 kg)  11/04/23 130 lb 15.3 oz (59.4 kg)

## 2024-01-18 DIAGNOSIS — Z992 Dependence on renal dialysis: Secondary | ICD-10-CM | POA: Diagnosis not present

## 2024-01-18 DIAGNOSIS — N186 End stage renal disease: Secondary | ICD-10-CM | POA: Diagnosis not present

## 2024-01-19 DIAGNOSIS — Z992 Dependence on renal dialysis: Secondary | ICD-10-CM | POA: Diagnosis not present

## 2024-01-19 DIAGNOSIS — N186 End stage renal disease: Secondary | ICD-10-CM | POA: Diagnosis not present

## 2024-01-20 ENCOUNTER — Ambulatory Visit (HOSPITAL_COMMUNITY)

## 2024-01-20 DIAGNOSIS — N186 End stage renal disease: Secondary | ICD-10-CM | POA: Diagnosis not present

## 2024-01-20 DIAGNOSIS — Z992 Dependence on renal dialysis: Secondary | ICD-10-CM | POA: Diagnosis not present

## 2024-01-21 DIAGNOSIS — Z992 Dependence on renal dialysis: Secondary | ICD-10-CM | POA: Diagnosis not present

## 2024-01-21 DIAGNOSIS — N186 End stage renal disease: Secondary | ICD-10-CM | POA: Diagnosis not present

## 2024-01-22 DIAGNOSIS — Z992 Dependence on renal dialysis: Secondary | ICD-10-CM | POA: Diagnosis not present

## 2024-01-22 DIAGNOSIS — N186 End stage renal disease: Secondary | ICD-10-CM | POA: Diagnosis not present

## 2024-01-23 DIAGNOSIS — N186 End stage renal disease: Secondary | ICD-10-CM | POA: Diagnosis not present

## 2024-01-23 DIAGNOSIS — Z992 Dependence on renal dialysis: Secondary | ICD-10-CM | POA: Diagnosis not present

## 2024-01-24 ENCOUNTER — Ambulatory Visit
Admission: RE | Admit: 2024-01-24 | Discharge: 2024-01-24 | Disposition: A | Discharge status: Home / Self Care | Payer: Self-pay | Source: Ambulatory Visit | Attending: Radiation Oncology | Admitting: Radiation Oncology

## 2024-01-24 ENCOUNTER — Encounter: Payer: Self-pay | Admitting: Radiation Oncology

## 2024-01-24 ENCOUNTER — Other Ambulatory Visit: Payer: Self-pay

## 2024-01-24 VITALS — BP 151/68 | HR 57 | Temp 97.8°F | Resp 15 | Ht 68.0 in | Wt 133.8 lb

## 2024-01-24 DIAGNOSIS — C07 Malignant neoplasm of parotid gland: Secondary | ICD-10-CM

## 2024-01-24 DIAGNOSIS — N186 End stage renal disease: Secondary | ICD-10-CM | POA: Diagnosis not present

## 2024-01-24 DIAGNOSIS — Z992 Dependence on renal dialysis: Secondary | ICD-10-CM | POA: Diagnosis not present

## 2024-01-24 NOTE — Progress Notes (Addendum)
 Radiation Oncology         [phone] ________________________________  Name: Bradley Black MRN: [id number]  Date: 01/24/2024  DOB: August 16, 1946  Follow-Up Visit Note  CC: Bradley Lefort, MD  Bradley Coupe, MD  Diagnosis and Prior Radiotherapy:       ICD-10-CM   1. 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 : s/p right radical parotidectomy, right radical neck dissection, and preservation of the facial nerve (negative margins and nodes) and adjuvant radiation  ==========DELIVERED PLANS==========  First Treatment Date: 2023-08-21 Last Treatment Date: 2023-09-19   Plan Name: [id number] Site: Parotid, Right Technique: IMRT Mode: Photon Dose Per Fraction: 2.5 Gy Prescribed Dose (Delivered / Prescribed): 50 Gy / 50 Gy Prescribed Fxs (Delivered / Prescribed): 20 / 20  CHIEF COMPLAINT:  Here for follow-up and surveillance of right parotid gland cancer  Narrative:   Bradley Black presents to the clinic today for a follow up. 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   Pain issues, if any: Patient has 5 out of 10 pain while swallowing and chewing in his upper jaw muscles Using a feeding tube?: None Weight changes, if any: Weight has stayed stable Swallowing issues, if any: Patient denies Smoking or chewing tobacco? Patient denies Using fluoride toothpaste daily? Yes Last ENT visit was on: January 2025   Other notable issues, if any:  None   BP (!) 151/68 (BP Location: Right Arm, Patient Position: Sitting, Cuff Size: Small)   Pulse (!) 57   Temp 97.8 F (36.6 C) (Oral)   Resp 15   Ht 5\' 8"  (1.727 m)   Wt 133 lb 12.8 oz (60.7 kg)   SpO2 100%   BMI 20.34 kg/m     Wt  Readings from Last 3 Encounters:  01/24/24 133 lb 12.8 oz (60.7 kg)  11/27/23 132 lb (59.9 kg)  11/04/23 130 lb 15.3 oz (59.4 kg)     ALLERGIES:  has no known allergies.  Meds: Current Outpatient Medications  Medication Sig Dispense Refill   amLODipine  (NORVASC ) 5 MG tablet TAKE ONE TABLET (5MG  TOTAL) BY MOUTH DAILY 90 tablet 1   aspirin  EC 81 MG tablet Take 81 mg by mouth daily.     atorvastatin  (LIPITOR) 40 MG tablet Take 1 tablet (40 mg total) by mouth daily. 90 tablet 3   Cholecalciferol (VITAMIN D3) 25 MCG (1000 UT) CAPS Take 1 capsule (1,000 Units total) by mouth daily at 6 (six) AM. 60 capsule 6   clopidogrel  (PLAVIX ) 75 MG tablet Take 1 tablet (75 mg total) by mouth daily. 90 tablet 1   doxazosin  (CARDURA ) 1 MG tablet Take 2 mg by mouth daily.     ferrous sulfate 325 (65 FE) MG tablet Take 325 mg by mouth daily with breakfast.     hydrALAZINE  (APRESOLINE ) 50 MG tablet TAKE ONE AND ONE-HALF TABLETS (75MG  TOTAL) BY MOUTH THREE TIMES DAILY 270 tablet 1   levothyroxine  (SYNTHROID ) 50 MCG tablet TAKE ONE TABLET ( TOTAL) BY MOUTH DAILY BEFORE BREAKFAST 90 tablet 1   LORazepam  (ATIVAN ) 0.5 MG tablet Take 1 tablet (0.5 mg total) by mouth 2 (two) times daily as needed (tremors). 10 tablet 0   multivitamin (  RENA-VIT) TABS tablet Take 1 tablet by mouth daily.     pantoprazole  (PROTONIX ) 40 MG tablet TAKE ONE TABLET BY MOUTH TWICE A DAY 180 tablet 0   sacubitril -valsartan  (ENTRESTO ) 97-103 MG TAKE ONE TABLET BY MOUTH TWICE A DAY 60 tablet 3   spironolactone  (ALDACTONE ) 25 MG tablet TAKE 0.5 TABLETS (12.5 MG TOTAL) BY MOUTH DAILY 15 tablet 6   torsemide  (DEMADEX ) 20 MG tablet Take 20 mg by mouth daily.     ketorolac  (ACULAR ) 0.5 % ophthalmic solution Place 1 drop into both eyes in the morning, at noon, and at bedtime. (Patient not taking: Reported on 01/24/2024)     No current facility-administered medications for this encounter.    Physical Findings: The patient is in no acute  distress. Patient is alert and oriented. Wt Readings from Last 3 Encounters:  01/24/24 133 lb 12.8 oz (60.7 kg)  11/27/23 132 lb (59.9 kg)  11/04/23 130 lb 15.3 oz (59.4 kg)    height is 5\' 8"  (1.727 m) and weight is 133 lb 12.8 oz (60.7 kg). His oral temperature is 97.8 F (36.6 C). His blood pressure is 151/68 (abnormal) and his pulse is 57 (abnormal). His respiration is 15 and oxygen saturation is 100%. .  General: Alert and oriented, in no acute distress HEENT: Head is normocephalic. Extraocular movements are intact. Oropharynx is notable for moist mucosa, no concerning lesions.  Neck: Neck is notable for no palpable adenopathy.   Skin: Skin in treatment fields shows satisfactory healing    Extremities: No cyanosis or edema. Lymphatics: see Neck Exam Psychiatric: Judgment and insight are intact.     Lab Findings: Lab Results  Component Value Date   WBC 9.4 11/04/2023   HGB 10.5 (L) 11/04/2023   HCT 31.3 (L) 11/04/2023   MCV 104.7 (H) 11/04/2023   PLT 138 (L) 11/04/2023    Lab Results  Component Value Date   TSH 3.53 04/22/2023    Radiographic Findings: CT Soft Tissue Neck Wo Contrast Result Date: 01/23/2024 CLINICAL DATA:  78 year old male with a right parotid mass diagnosed in the fall, post parotidectomy, lymph node dissection, XRT. Restaging. Dialysis. EXAM: CT NECK WITHOUT CONTRAST TECHNIQUE: Multidetector CT imaging of the neck was performed following the standard protocol without intravenous contrast. RADIATION DOSE REDUCTION: This exam was performed according to the departmental dose-optimization program which includes automated exposure control, adjustment of the mA and/or kV according to patient size and/or use of iterative reconstruction technique. COMPARISON:  Pretreatment neck CT 05/07/2023. Chest CT the same day as this exam reported separately. FINDINGS: Pharynx and larynx: Noncontrast larynx and pharynx appears stable and within normal limits. Post treatment  changes to the superior right parapharyngeal space with no adverse features. Noncontrast retropharyngeal and left parapharyngeal spaces appear to remain normal. Salivary glands: Negative noncontrast sublingual space, left submandibular gland, left parotid gland (stable punctate left parotid sialolithiasis). Postoperative changes and surgical clips from right parotidectomy, resection of the right submandibular gland, and other regional node dissection. Trace residual right parotid parenchyma suspected. No residual or recurrent mass is evident on this noncontrast exam. Noncontrast I low mastoid foraminal soft tissues appears symmetric and within normal limits. Thyroid : Negative. Lymph nodes: Right neck lymph node dissection. Superimposed probable post XRT changes also to the right neck soft tissue planes. No cervical lymphadenopathy identified on this noncontrast exam, and diminutive left side cervical nodes appears stable. Vascular: Severe calcified carotid atherosclerosis bilaterally. Vascular patency is not evaluated in the absence of IV contrast. Limited intracranial: Bilateral  ICA siphon, distal vertebral artery calcified atherosclerosis. Otherwise negative for age visible noncontrast brain parenchyma. Visualized orbits: Postoperative changes to both globes. Mastoids and visualized paranasal sinuses: Visualized paranasal sinuses and mastoids are clear. Skeleton: Previous sternotomy. 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 No acute or suspicious osseous lesion identified. Upper chest: Dedicated chest CT the same day is reported separately. IMPRESSION: 1. New post treatment appearance following right parotidectomy, right submandibular gland and right neck lymph node dissection, XRT. No mass or lymphadenopathy identified on this noncontrast exam. NI-RADS category 1. 2. Severe calcified carotid atherosclerosis in the neck and at the  skull base. 3. Chest CT the same day is reported separately. Electronically Signed   By: Marlise Simpers M.D.   On: 01/23/2024 10:56   CT CHEST WO CONTRAST Result Date: 01/23/2024 CLINICAL DATA:  History of right parotid squamous cell carcinoma status post parotidectomy and adjuvant XRT completed 09/2023. Surveillance. * Tracking Code: BO * EXAM: CT CHEST WITHOUT CONTRAST TECHNIQUE: Multidetector CT imaging of the chest was performed following the standard protocol without IV contrast. RADIATION DOSE REDUCTION: This exam was performed according to the departmental dose-optimization program which includes automated exposure control, adjustment of the mA and/or kV according to patient size and/or use of iterative reconstruction technique. COMPARISON:  None Available. FINDINGS: Cardiovascular: Postsurgical changes of anterior mediastinum. Left atrial enlargement. No significant pericardial fluid/thickening. Great vessels are normal in course and caliber. Coronary artery calcifications and aortic atherosclerosis. Mediastinum/Nodes: Postsurgical changes of partially imaged right neck. Imaged thyroid  gland without nodules meeting criteria for imaging follow-up by size. Normal esophagus. No pathologically enlarged axillary, supraclavicular, mediastinal, or hilar lymph nodes. Lungs/Pleura: The central airways are patent. No focal consolidation. No pneumothorax. No pleural effusion. Upper abdomen: Trace perihepatic free fluid. Musculoskeletal: Median sternotomy wires are nondisplaced. No acute or abnormal lytic or blastic osseous lesions. Multilevel degenerative changes of the thoracic spine. IMPRESSION: 1. No evidence of metastatic disease in the chest. 2. Trace perihepatic free fluid, likely related to history of peritoneal dialysis. 3. Aortic Atherosclerosis (ICD10-I70.0). Coronary artery calcifications. Assessment for potential risk factor modification, dietary therapy or pharmacologic therapy may be warranted, if clinically  indicated. Electronically Signed   By: Limin  Xu M.D.   On: 01/23/2024 09:24    Impression/Plan:    1) Head and Neck Cancer Status: NED with excellent scans today showing that he is in remission  2) Nutritional Status:  stable Wt Readings from Last 3 Encounters:  01/24/24 133 lb 12.8 oz (60.7 kg)  11/27/23 132 lb (59.9 kg)  11/04/23 130 lb 15.3 oz (59.4 kg)   3) Dental: Encouraged to continue regular followup with dentistry, and dental hygiene including fluoride rinses. Using fluoride toothpaste daily.  Recommended he use ACT fluoride mouthwash without alcohol at night since he develops a dry mouth at night  4) Thyroid  function: Checking annually with an outside doctor -he was on a supplement before receiving radiation.    Lab Results  Component Value Date   TSH 3.53 04/22/2023   5) The patient has a longstanding history of depression which is related in part due to chronic pain and declining health.  He has been referred in the past to social work.  He does not feel the need to be referred to a patient mentor at this time.  Emotional support was given today, and he was advised to continue following with his outside doctors, including his PCP, and pursue medication management  if that is felt to be appropriate in the future.  He and his wife were happy about the good results from his scans today which is a positive light in the midst of many hardships.  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  7) He will enroll in our survivorship program and be seen back at the cancer center in 6 months for follow-up.  I think it is reasonable for him to pursue surveillance CT imaging of the neck and chest in 12 months under the care of survivorship.  [facility] will navigate him back to otolaryngology as well.   On date of service, in total, I spent 30 minutes on this  encounter. Patient was seen in person. _____________________________________ -----------------------------------  Colie Dawes, MD

## 2024-01-24 NOTE — Progress Notes (Signed)
 Oncology Nurse Navigator Documentation   I met with Bradley Black and his wife during his follow up with Dr. Lurena Sally today. He is recovering well after treatment for his head and neck cancer.   I sent a fax to [facility] ENT Scheduling with request Bradley Black be contacted and scheduled for routine post-RT follow-up with Dr. Ralston Burkes in 3 months.  Notification of successful fax transmission received.   I have also placed a referral for head and neck survivorship in 6 months at Dr. Barrie Borer request. Bradley Black and his wife know to call me if they have any questions or concerns at anytime.   Lynetta Saran RN, BSN, OCN Head & Neck Oncology Nurse Navigator [facility] Phone # [phone]  Fax # [phone]

## 2024-01-25 DIAGNOSIS — Z992 Dependence on renal dialysis: Secondary | ICD-10-CM | POA: Diagnosis not present

## 2024-01-25 DIAGNOSIS — N186 End stage renal disease: Secondary | ICD-10-CM | POA: Diagnosis not present

## 2024-01-26 DIAGNOSIS — Z992 Dependence on renal dialysis: Secondary | ICD-10-CM | POA: Diagnosis not present

## 2024-01-26 DIAGNOSIS — N186 End stage renal disease: Secondary | ICD-10-CM | POA: Diagnosis not present

## 2024-01-27 DIAGNOSIS — Z992 Dependence on renal dialysis: Secondary | ICD-10-CM | POA: Diagnosis not present

## 2024-01-27 DIAGNOSIS — N186 End stage renal disease: Secondary | ICD-10-CM | POA: Diagnosis not present

## 2024-01-28 ENCOUNTER — Encounter: Payer: Self-pay | Admitting: *Deleted

## 2024-01-28 DIAGNOSIS — N186 End stage renal disease: Secondary | ICD-10-CM | POA: Diagnosis not present

## 2024-01-28 DIAGNOSIS — Z992 Dependence on renal dialysis: Secondary | ICD-10-CM | POA: Diagnosis not present

## 2024-01-29 DIAGNOSIS — Z992 Dependence on renal dialysis: Secondary | ICD-10-CM | POA: Diagnosis not present

## 2024-01-29 DIAGNOSIS — N186 End stage renal disease: Secondary | ICD-10-CM | POA: Diagnosis not present

## 2024-01-30 DIAGNOSIS — N186 End stage renal disease: Secondary | ICD-10-CM | POA: Diagnosis not present

## 2024-01-30 DIAGNOSIS — Z992 Dependence on renal dialysis: Secondary | ICD-10-CM | POA: Diagnosis not present

## 2024-01-31 DIAGNOSIS — Z992 Dependence on renal dialysis: Secondary | ICD-10-CM | POA: Diagnosis not present

## 2024-01-31 DIAGNOSIS — N186 End stage renal disease: Secondary | ICD-10-CM | POA: Diagnosis not present

## 2024-02-01 DIAGNOSIS — N186 End stage renal disease: Secondary | ICD-10-CM | POA: Diagnosis not present

## 2024-02-01 DIAGNOSIS — Z992 Dependence on renal dialysis: Secondary | ICD-10-CM | POA: Diagnosis not present

## 2024-02-02 DIAGNOSIS — Z992 Dependence on renal dialysis: Secondary | ICD-10-CM | POA: Diagnosis not present

## 2024-02-02 DIAGNOSIS — N186 End stage renal disease: Secondary | ICD-10-CM | POA: Diagnosis not present

## 2024-02-03 DIAGNOSIS — N186 End stage renal disease: Secondary | ICD-10-CM | POA: Diagnosis not present

## 2024-02-03 DIAGNOSIS — Z992 Dependence on renal dialysis: Secondary | ICD-10-CM | POA: Diagnosis not present

## 2024-02-04 DIAGNOSIS — Z992 Dependence on renal dialysis: Secondary | ICD-10-CM | POA: Diagnosis not present

## 2024-02-04 DIAGNOSIS — N186 End stage renal disease: Secondary | ICD-10-CM | POA: Diagnosis not present

## 2024-02-05 DIAGNOSIS — Z992 Dependence on renal dialysis: Secondary | ICD-10-CM | POA: Diagnosis not present

## 2024-02-05 DIAGNOSIS — N186 End stage renal disease: Secondary | ICD-10-CM | POA: Diagnosis not present

## 2024-02-06 DIAGNOSIS — N186 End stage renal disease: Secondary | ICD-10-CM | POA: Diagnosis not present

## 2024-02-06 DIAGNOSIS — Z992 Dependence on renal dialysis: Secondary | ICD-10-CM | POA: Diagnosis not present

## 2024-02-07 DIAGNOSIS — Z992 Dependence on renal dialysis: Secondary | ICD-10-CM | POA: Diagnosis not present

## 2024-02-07 DIAGNOSIS — N186 End stage renal disease: Secondary | ICD-10-CM | POA: Diagnosis not present

## 2024-02-08 DIAGNOSIS — Z992 Dependence on renal dialysis: Secondary | ICD-10-CM | POA: Diagnosis not present

## 2024-02-08 DIAGNOSIS — N186 End stage renal disease: Secondary | ICD-10-CM | POA: Diagnosis not present

## 2024-02-09 DIAGNOSIS — Z992 Dependence on renal dialysis: Secondary | ICD-10-CM | POA: Diagnosis not present

## 2024-02-09 DIAGNOSIS — N186 End stage renal disease: Secondary | ICD-10-CM | POA: Diagnosis not present

## 2024-02-10 DIAGNOSIS — N186 End stage renal disease: Secondary | ICD-10-CM | POA: Diagnosis not present

## 2024-02-10 DIAGNOSIS — Z992 Dependence on renal dialysis: Secondary | ICD-10-CM | POA: Diagnosis not present

## 2024-02-11 DIAGNOSIS — H6123 Impacted cerumen, bilateral: Secondary | ICD-10-CM | POA: Diagnosis not present

## 2024-02-11 DIAGNOSIS — H93299 Other abnormal auditory perceptions, unspecified ear: Secondary | ICD-10-CM | POA: Diagnosis not present

## 2024-02-11 DIAGNOSIS — Z9049 Acquired absence of other specified parts of digestive tract: Secondary | ICD-10-CM | POA: Diagnosis not present

## 2024-02-11 DIAGNOSIS — N186 End stage renal disease: Secondary | ICD-10-CM | POA: Diagnosis not present

## 2024-02-11 DIAGNOSIS — C07 Malignant neoplasm of parotid gland: Secondary | ICD-10-CM | POA: Diagnosis not present

## 2024-02-11 DIAGNOSIS — Z923 Personal history of irradiation: Secondary | ICD-10-CM | POA: Diagnosis not present

## 2024-02-11 DIAGNOSIS — Z9889 Other specified postprocedural states: Secondary | ICD-10-CM | POA: Diagnosis not present

## 2024-02-11 DIAGNOSIS — Z992 Dependence on renal dialysis: Secondary | ICD-10-CM | POA: Diagnosis not present

## 2024-02-12 DIAGNOSIS — Z992 Dependence on renal dialysis: Secondary | ICD-10-CM | POA: Diagnosis not present

## 2024-02-12 DIAGNOSIS — N186 End stage renal disease: Secondary | ICD-10-CM | POA: Diagnosis not present

## 2024-02-13 DIAGNOSIS — Z992 Dependence on renal dialysis: Secondary | ICD-10-CM | POA: Diagnosis not present

## 2024-02-13 DIAGNOSIS — N186 End stage renal disease: Secondary | ICD-10-CM | POA: Diagnosis not present

## 2024-02-14 DIAGNOSIS — N186 End stage renal disease: Secondary | ICD-10-CM | POA: Diagnosis not present

## 2024-02-14 DIAGNOSIS — Z992 Dependence on renal dialysis: Secondary | ICD-10-CM | POA: Diagnosis not present

## 2024-02-15 DIAGNOSIS — Z992 Dependence on renal dialysis: Secondary | ICD-10-CM | POA: Diagnosis not present

## 2024-02-15 DIAGNOSIS — N186 End stage renal disease: Secondary | ICD-10-CM | POA: Diagnosis not present

## 2024-02-16 DIAGNOSIS — Z992 Dependence on renal dialysis: Secondary | ICD-10-CM | POA: Diagnosis not present

## 2024-02-16 DIAGNOSIS — N186 End stage renal disease: Secondary | ICD-10-CM | POA: Diagnosis not present

## 2024-02-17 DIAGNOSIS — N186 End stage renal disease: Secondary | ICD-10-CM | POA: Diagnosis not present

## 2024-02-17 DIAGNOSIS — Z992 Dependence on renal dialysis: Secondary | ICD-10-CM | POA: Diagnosis not present

## 2024-02-18 DIAGNOSIS — Z992 Dependence on renal dialysis: Secondary | ICD-10-CM | POA: Diagnosis not present

## 2024-02-18 DIAGNOSIS — N186 End stage renal disease: Secondary | ICD-10-CM | POA: Diagnosis not present

## 2024-02-19 DIAGNOSIS — N186 End stage renal disease: Secondary | ICD-10-CM | POA: Diagnosis not present

## 2024-02-19 DIAGNOSIS — Z992 Dependence on renal dialysis: Secondary | ICD-10-CM | POA: Diagnosis not present

## 2024-02-20 DIAGNOSIS — N186 End stage renal disease: Secondary | ICD-10-CM | POA: Diagnosis not present

## 2024-02-20 DIAGNOSIS — Z992 Dependence on renal dialysis: Secondary | ICD-10-CM | POA: Diagnosis not present

## 2024-02-21 DIAGNOSIS — Z992 Dependence on renal dialysis: Secondary | ICD-10-CM | POA: Diagnosis not present

## 2024-02-21 DIAGNOSIS — N186 End stage renal disease: Secondary | ICD-10-CM | POA: Diagnosis not present

## 2024-02-22 DIAGNOSIS — N186 End stage renal disease: Secondary | ICD-10-CM | POA: Diagnosis not present

## 2024-02-22 DIAGNOSIS — Z992 Dependence on renal dialysis: Secondary | ICD-10-CM | POA: Diagnosis not present

## 2024-02-23 DIAGNOSIS — Z992 Dependence on renal dialysis: Secondary | ICD-10-CM | POA: Diagnosis not present

## 2024-02-23 DIAGNOSIS — N186 End stage renal disease: Secondary | ICD-10-CM | POA: Diagnosis not present

## 2024-02-24 DIAGNOSIS — N186 End stage renal disease: Secondary | ICD-10-CM | POA: Diagnosis not present

## 2024-02-24 DIAGNOSIS — Z992 Dependence on renal dialysis: Secondary | ICD-10-CM | POA: Diagnosis not present

## 2024-02-25 ENCOUNTER — Ambulatory Visit: Payer: Medicare Other | Admitting: Diagnostic Neuroimaging

## 2024-02-25 DIAGNOSIS — N186 End stage renal disease: Secondary | ICD-10-CM | POA: Diagnosis not present

## 2024-02-25 DIAGNOSIS — Z992 Dependence on renal dialysis: Secondary | ICD-10-CM | POA: Diagnosis not present

## 2024-02-26 DIAGNOSIS — N186 End stage renal disease: Secondary | ICD-10-CM | POA: Diagnosis not present

## 2024-02-26 DIAGNOSIS — Z992 Dependence on renal dialysis: Secondary | ICD-10-CM | POA: Diagnosis not present

## 2024-02-27 ENCOUNTER — Encounter: Payer: Self-pay | Admitting: Diagnostic Neuroimaging

## 2024-02-27 ENCOUNTER — Ambulatory Visit: Admitting: Diagnostic Neuroimaging

## 2024-02-27 VITALS — BP 150/70 | HR 58 | Wt 133.0 lb

## 2024-02-27 DIAGNOSIS — G253 Myoclonus: Secondary | ICD-10-CM | POA: Diagnosis not present

## 2024-02-27 DIAGNOSIS — N186 End stage renal disease: Secondary | ICD-10-CM | POA: Diagnosis not present

## 2024-02-27 DIAGNOSIS — Z992 Dependence on renal dialysis: Secondary | ICD-10-CM | POA: Diagnosis not present

## 2024-02-27 DIAGNOSIS — R251 Tremor, unspecified: Secondary | ICD-10-CM

## 2024-02-27 NOTE — Progress Notes (Signed)
 [facility]  PATIENT: Bradley Black DOB: 1946/07/24  REFERRING CLINICIAN: Austine Lefort, MD HISTORY FROM: patient  REASON FOR VISIT: new consult   HISTORICAL  CHIEF COMPLAINT:  Chief Complaint  Patient presents with   Tremors    Rm 7 with spouse Pt is well, reports he has been having full body tremors for about 3 yrs. Symptoms are not persistent. In the last 6 months, he has had about 3 tremor episodes. He was taking Ativan  that did help, he has ran out of refills.     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  Symptoms started around 2021 when he started dialysis.  Has some intermittent muscle jerks and tremors.  Symptoms have been intermittent over time.   REVIEW OF SYSTEMS: Full 14 system review of systems performed and negative with exception of: as per HPI.  ALLERGIES: No Known Allergies  HOME MEDICATIONS: Outpatient Medications Prior to Visit  Medication Sig Dispense Refill   amLODipine  (NORVASC ) 5 MG tablet TAKE ONE TABLET (5MG  TOTAL) BY MOUTH DAILY 90 tablet 1   aspirin  EC 81 MG tablet Take 81 mg by mouth daily.     atorvastatin  (LIPITOR) 40 MG tablet Take 1 tablet (40 mg total) by mouth daily. 90 tablet 3   Cholecalciferol (VITAMIN D3) 25 MCG (1000 UT) CAPS Take 1 capsule (1,000 Units total) by mouth daily at 6 (six) AM. 60 capsule 6   clopidogrel  (PLAVIX ) 75 MG tablet Take 1 tablet (75 mg total) by mouth daily. 90 tablet 1   doxazosin  (CARDURA ) 8 MG tablet Take 8 mg by mouth daily.     ferrous sulfate 325 (65 FE) MG tablet Take 325 mg by mouth daily with breakfast.     hydrALAZINE  (APRESOLINE ) 50 MG tablet TAKE ONE AND ONE-HALF TABLETS (75MG  TOTAL) BY MOUTH THREE TIMES DAILY 270 tablet 1   ketorolac  (ACULAR ) 0.5 % ophthalmic solution Place 1 drop into both eyes in the morning, at noon, and at bedtime.     levothyroxine  (SYNTHROID ) 50  MCG tablet TAKE ONE TABLET ( TOTAL) BY MOUTH DAILY BEFORE BREAKFAST 90 tablet 1   LORazepam  (ATIVAN ) 0.5 MG tablet Take 1 tablet (0.5 mg total) by mouth 2 (two) times daily as needed (tremors). 10 tablet 0   multivitamin (RENA-VIT) TABS tablet Take 1 tablet by mouth daily.     pantoprazole  (PROTONIX ) 40 MG tablet TAKE ONE TABLET BY MOUTH TWICE A DAY 180 tablet 0   sacubitril -valsartan  (ENTRESTO ) 97-103 MG TAKE ONE TABLET BY MOUTH TWICE A DAY 60 tablet 3   spironolactone  (ALDACTONE ) 25 MG tablet TAKE 0.5 TABLETS (12.5 MG TOTAL) BY MOUTH DAILY 15 tablet 6   torsemide  (DEMADEX ) 20 MG tablet Take 20 mg by mouth daily.     doxazosin  (CARDURA ) 1 MG tablet Take 2 mg by mouth daily. (Patient not taking: Reported on 02/27/2024)     No facility-administered medications prior to visit.    PAST MEDICAL HISTORY: Past Medical History:  Diagnosis Date   BPH (benign prostatic hyperplasia)    CAD S/P percutaneous coronary angioplasty    a. PTCA of OM 1988 & 1994 b. PCI with BMS to LAD in 1997 c. RCA PCI BMS 1999 & 2000 d. s/p CABG in 11/2011 with LIMA-LAD, SVG-PDA, SVG-OM2, and SVG-D1   Cataract    Chronic back pain    CKD (chronic kidney disease), stage III (HCC)  Cyst of bursa    R shoulder   Diabetic retinopathy (HCC)    DM (diabetes mellitus), type 2 with renal complications (HCC)    Essential hypertension    Frequent PVCs    GERD (gastroesophageal reflux disease)    Hypertensive retinopathy    Ischemic cardiomyopathy 11/2011   Intra-OP TEE: EF 40-45%, no regional WMA; improved Anterior WM post CABG.   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   LIMA to LAD, SVG to D1, SVG to OM2, SVG to PDA, EVH via right thigh and leg   Shingles     PAST SURGICAL HISTORY: Past Surgical History:  Procedure Laterality Date   AV FISTULA PLACEMENT Left 07/28/2020    Procedure: LEFT ARM ARTERIOVENOUS (AV) FISTULA  CREATION;  Surgeon: Mayo Speck, MD;  Location: [facility];  Service: Vascular;  Laterality: Left;   BACK SURGERY  10/01/1968   BIOPSY  01/25/2022   Procedure: BIOPSY;  Surgeon: Suzette Espy, MD;  Location: [facility];  Service: Endoscopy;;   CARDIAC CATHETERIZATION  10/02/2011   CATARACT EXTRACTION W/PHACO Left 09/14/2022   Procedure: CATARACT EXTRACTION PHACO AND INTRAOCULAR LENS PLACEMENT (IOC);  Surgeon: Tarri Farm, MD;  Location: [facility];  Service: Ophthalmology;  Laterality: Left;  CDE: 8.66   CATARACT EXTRACTION W/PHACO Right 09/28/2022   Procedure: CATARACT EXTRACTION PHACO AND INTRAOCULAR LENS PLACEMENT (IOC);  Surgeon: Tarri Farm, MD;  Location: [facility];  Service: Ophthalmology;  Laterality: Right;  CDE 7.79   COLONOSCOPY WITH PROPOFOL  N/A 01/25/2022   Procedure: COLONOSCOPY WITH PROPOFOL ;  Surgeon: Suzette Espy, MD;  [facility]: [facility];  Service: Endoscopy;  Laterality: N/A;  10:30am   CORONARY ANGIOPLASTY  10/01/1992   OM   CORONARY ANGIOPLASTY  10/02/1995   LAD   CORONARY ANGIOPLASTY WITH STENT PLACEMENT  10/01/1997   RCA   CORONARY ANGIOPLASTY WITH STENT PLACEMENT  10/02/1999   RCA   CORONARY ARTERY BYPASS GRAFT  12/27/2011   Procedure: CORONARY ARTERY BYPASS GRAFTING (CABG);  Surgeon: Gardenia Jump, MD;  Location: [facility] OR;  Service: Open Heart Surgery;  Laterality: N/A;  Times four. On pump. Using endoscopically harvested right greater saphenous vein and left internal mammary artery.    CORONARY STENT INTERVENTION N/A 07/25/2023   Procedure: CORONARY STENT INTERVENTION;  Surgeon: Odie Benne, MD;  Location: [facility] INV[facility] LAB;  Service: Cardiovascular;  Laterality: N/A;   ESOPHAGOGASTRODUODENOSCOPY (EGD) WITH PROPOFOL  N/A 01/25/2022   Procedure: ESOPHAGOGASTRODUODENOSCOPY (EGD) WITH PROPOFOL ;  Surgeon: Suzette Espy, MD;  [facility]: [facility];  Service: Endoscopy;  Laterality: N/A;   HERNIA  REPAIR Bilateral    INCISION AND DRAINAGE OF WOUND  10/01/2004   axilla   INSERTION OF DIALYSIS CATHETER Right 07/25/2020   Procedure: INSERTION OF DIALYSIS CATHETER;  Surgeon: Awilda Bogus, MD;  Location: [facility];  Service: General;  Laterality: Right;   INTRAOPERATIVE TRANSESOPHAGEAL ECHOCARDIOGRAM  12/27/2011   Global hypokinesis with EF of 40-45%, improved LAD distribution wall motion.   LEFT HEART CATH AND CORS/GRAFTS ANGIOGRAPHY N/A 07/25/2023   Procedure: LEFT HEART CATH AND CORS/GRAFTS ANGIOGRAPHY;  Surgeon: Odie Benne, MD;  Location: [facility];  Service: Cardiovascular;  Laterality: N/A;   LEFT HEART CATHETERIZATION WITH CORONARY ANGIOGRAM N/A 12/13/2011   Procedure: LEFT HEART CATHETERIZATION WITH CORONARY ANGIOGRAM;  Surgeon:  Arleen Lacer, MD;  Location: [facility] CATH LAB;  Service: Cardiovascular;  Laterality: N/A;   LUMBAR FUSION     MASS EXCISION  10/24/2011   R arm   PAROTIDECTOMY Right 07/05/2023   Procedure: 1. Right radical parotidectomy with wide local excision facial skin 3x3cm, dissection and preservation of facial nerve (CPT 42415, 11644);  Surgeon: Rush Coupe, MD;  Location: [facility] OR;  Service: ENT;  Laterality: Right;   POLYPECTOMY  01/25/2022   Procedure: POLYPECTOMY;  Surgeon: Suzette Espy, MD;  [facility];  Service: Endoscopy;;   RADICAL NECK DISSECTION Right 07/05/2023   Procedure: 2. Right modified radical neck dissection 1-3 (CPT [phone]) 3. Cervicofacial rotation advancement flap 10x10cm for closure of 3x3cm defect (CPT 14040);  Surgeon: Rush Coupe, MD;  Location: [facility] OR;  Service: ENT;  Laterality: Right;   RIGHT HEART CATH N/A 07/12/2020   Procedure: RIGHT HEART CATH;  Surgeon: Arty Binning, MD;  Location: [facility] INVASIVE CV LAB;  Service: Cardiovascular;  Laterality: N/A;   TRANSESOPHAGEAL ECHOCARDIOGRAM  10/02/2011    FAMILY HISTORY: Family History  Problem Relation Age of Onset   Cancer Mother        breast    Cancer Father        bone   Nels cancer Neg Hx     SOCIAL HISTORY: Social History   Socioeconomic History   Marital status: Married    Spouse name: Alvy Baar   Number of children: 1   Years of education: college   Highest education level: Bachelor's degree (e.g., BA, AB, BS)  Occupational History   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  Tobacco Use   Smoking status: Former    Current packs/day: 0.00    Average packs/day: 1 pack/day for 15.0 years (15.0 ttl pk-yrs)    Types: Cigarettes    Start date: 10/02/1971    Quit date: 10/01/1986    Years since quitting: 37.4   Smokeless tobacco: Never   Tobacco comments:    quit about 30 yrs ago  Vaping Use   Vaping status: Never Used  Substance and Sexual Activity   Alcohol use: No   Drug use: No   Sexual activity: Not Currently  Other Topics Concern   Not on file  Social History Narrative   Married,  father of one,  grandfather 1. Works out at [employer] at [employer] roughly 2-3 days a week. He works on a treadmill. He does note having a hard time getting his heart rate up.   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   He quit smoking in 1988, and does not drink alcohol.   Social Drivers of Corporate investment banker Strain: Low Risk  (12/12/2023)   Overall Financial Resource Strain (CARDIA)    Difficulty of Paying Living Expenses: Not hard at all  Food Insecurity: No Food Insecurity (12/12/2023)   Hunger Vital Sign    Worried About Running Out of Food in the Last Year: Never true    Ran Out of Food in the Last Year: Never true  Transportation Needs: No Transportation Needs (12/12/2023)   PRAPARE - Administrator, Civil Service (Medical): No    Lack of Transportation (Non-Medical): No  Physical Activity: Sufficiently Active (12/12/2023)   Exercise Vital Sign    Days of Exercise per Week: 4 days    Minutes of Exercise per Session: 40 min  Stress: No Stress Concern Present (12/12/2023)   [place]  Institute of Occupational Health - Occupational Stress Questionnaire    Feeling of Stress : Not at all  Social Connections: Moderately Integrated (12/12/2023)   Social Connection and Isolation Panel [NHANES]    Frequency of Communication with Friends and Family: More than three times a week    Frequency of Social Gatherings with Friends and Family: Once a week    Attends Religious Services: More than 4 times per year    Active Member of [employer] or Organizations: No    Attends Banker Meetings: Never    Marital Status: Married  Catering manager Violence: Not At Risk (12/12/2023)   Humiliation, Afraid, Rape, and Kick questionnaire    Fear of Current or Ex-Partner: No    Emotionally Abused: No    Physically Abused: No    Sexually Abused: No     PHYSICAL EXAM  GENERAL EXAM/CONSTITUTIONAL: Vitals:  Vitals:   02/27/24 1153  BP: (!) 150/70  Pulse: (!) 58  Weight: 133 lb (60.3 kg)   Body mass index is 20.22 kg/m. Wt Readings from Last 3 Encounters:  02/27/24 133 lb (60.3 kg)  01/24/24 133 lb 12.8 oz (60.7 kg)  11/27/23 132 lb (59.9 kg)   Patient is in no distress; well developed, nourished and groomed; neck is supple  CARDIOVASCULAR: Examination of carotid arteries is normal; no carotid bruits Regular rate and rhythm, no murmurs Examination of peripheral vascular system by observation and palpation is normal  EYES: Ophthalmoscopic exam of optic discs and posterior segments is normal; no papilledema or hemorrhages No results found.  MUSCULOSKELETAL: Gait, strength, tone, movements noted in Neurologic exam below  NEUROLOGIC: MENTAL STATUS:      No data to display         awake, alert, oriented to person, place and time recent and remote memory intact normal attention and concentration language fluent, comprehension intact, naming intact fund of knowledge appropriate  CRANIAL NERVE:  2nd - no papilledema on fundoscopic exam 2nd, 3rd, 4th, 6th - pupils  equal and reactive to light, visual fields full to confrontation, extraocular muscles intact, no nystagmus 5th - facial sensation symmetric 7th - facial strength symmetric 8th - hearing intact 9th - palate elevates symmetrically, uvula midline 11th - shoulder shrug symmetric 12th - tongue protrusion midline  MOTOR:  normal bulk and tone, full strength in the BUE, BLE MILD POSTURAL TREMOR  SENSORY:  normal and symmetric to light touch, pinprick, temperature, vibration  COORDINATION:  finger-nose-finger, fine finger movements normal  REFLEXES:  deep tendon reflexes TRACE and symmetric  GAIT/STATION:  narrow based gait; UNSTEADY     DIAGNOSTIC DATA (LABS, IMAGING, TESTING) - I reviewed patient records, labs, notes, testing and imaging myself where available.  Lab Results  Component Value Date   WBC 9.4 11/04/2023   HGB 10.5 (L) 11/04/2023   HCT 31.3 (L) 11/04/2023   MCV 104.7 (H) 11/04/2023   PLT 138 (L) 11/04/2023      Component Value Date/Time   NA 139 11/04/2023 1049   NA 140 07/18/2023 1226   K 3.8 11/04/2023 1049   CL 104 11/04/2023 1049   CO2 22 11/04/2023 1049   GLUCOSE 157 (H) 11/04/2023 1049   BUN 57 (H) 11/04/2023 1049   BUN 46 (H) 07/18/2023 1226   CREATININE 4.26 (H) 11/04/2023 1049   CREATININE 4.59 (H) 05/21/2023 0925   CALCIUM  9.4 11/04/2023 1049   PROT 6.0 (L) 11/04/2023 1049   ALBUMIN  3.1 (L) 11/04/2023 1049   AST 18 11/04/2023 1049  ALT 20 11/04/2023 1049   ALKPHOS 64 11/04/2023 1049   BILITOT 1.1 11/04/2023 1049   GFRNONAA 13 (L) 11/04/2023 1049   GFRNONAA 21 (L) 05/01/2019 0945   GFRAA 24 (L) 05/01/2019 0945   Lab Results  Component Value Date   CHOL 142 05/21/2023   HDL 41 05/21/2023   LDLCALC 83 05/21/2023   TRIG 90 05/21/2023   CHOLHDL 3.5 05/21/2023   Lab Results  Component Value Date   HGBA1C 6.0 (H) 05/21/2023   Lab Results  Component Value Date   VITAMINB12 473 05/21/2023   Lab Results  Component Value Date   TSH  3.53 04/22/2023    07/18/20 MRI brain  - No acute abnormality - Moderate atrophy. Mild to moderate white matter changes consistent with chronic microvascular ischemia.  11/04/23 CT head  1.  No evidence of an acute intracranial abnormality. 2. Parenchymal atrophy and chronic small vessel ischemic disease.    ASSESSMENT AND PLAN  78 y.o. year old male here with:   Dx:  1. Myoclonus   2. Tremor   3. ESRD (end stage renal disease) (HCC)     PLAN:  MILD INTERMITTENT TREMOR / MYOCLONUS (since ~ 2021; likely related to ESRD, toxic-metabolic causes) - continue to monitor; supportive care; could consider low dose gabapentin  or clonazepam in future for severe symptoms  Return for return to PCP, return to referring provider.    Omega Bible, MD 02/27/2024, 12:10 PM Certified in Neurology, Neurophysiology and Neuroimaging  [facility] [address], [address] [place], [place] [address] [phone]

## 2024-02-27 NOTE — Patient Instructions (Signed)
  MILD INTERMITTENT TREMOR / MYOCLONUS (since ~ 2021; likely related to ESRD, toxic-metabolic causes) - continue to monitor; supportive care; could consider low dose gabapentin  or clonazepam in future for severe symptoms

## 2024-02-28 DIAGNOSIS — Z992 Dependence on renal dialysis: Secondary | ICD-10-CM | POA: Diagnosis not present

## 2024-02-28 DIAGNOSIS — N186 End stage renal disease: Secondary | ICD-10-CM | POA: Diagnosis not present

## 2024-02-29 DIAGNOSIS — N186 End stage renal disease: Secondary | ICD-10-CM | POA: Diagnosis not present

## 2024-02-29 DIAGNOSIS — Z992 Dependence on renal dialysis: Secondary | ICD-10-CM | POA: Diagnosis not present

## 2024-03-01 DIAGNOSIS — N186 End stage renal disease: Secondary | ICD-10-CM | POA: Diagnosis not present

## 2024-03-01 DIAGNOSIS — Z992 Dependence on renal dialysis: Secondary | ICD-10-CM | POA: Diagnosis not present

## 2024-03-02 DIAGNOSIS — N186 End stage renal disease: Secondary | ICD-10-CM | POA: Diagnosis not present

## 2024-03-02 DIAGNOSIS — Z992 Dependence on renal dialysis: Secondary | ICD-10-CM | POA: Diagnosis not present

## 2024-03-03 DIAGNOSIS — Z992 Dependence on renal dialysis: Secondary | ICD-10-CM | POA: Diagnosis not present

## 2024-03-03 DIAGNOSIS — N186 End stage renal disease: Secondary | ICD-10-CM | POA: Diagnosis not present

## 2024-03-04 DIAGNOSIS — Z992 Dependence on renal dialysis: Secondary | ICD-10-CM | POA: Diagnosis not present

## 2024-03-04 DIAGNOSIS — N186 End stage renal disease: Secondary | ICD-10-CM | POA: Diagnosis not present

## 2024-03-05 DIAGNOSIS — N186 End stage renal disease: Secondary | ICD-10-CM | POA: Diagnosis not present

## 2024-03-05 DIAGNOSIS — Z992 Dependence on renal dialysis: Secondary | ICD-10-CM | POA: Diagnosis not present

## 2024-03-06 DIAGNOSIS — N186 End stage renal disease: Secondary | ICD-10-CM | POA: Diagnosis not present

## 2024-03-06 DIAGNOSIS — Z992 Dependence on renal dialysis: Secondary | ICD-10-CM | POA: Diagnosis not present

## 2024-03-07 DIAGNOSIS — N186 End stage renal disease: Secondary | ICD-10-CM | POA: Diagnosis not present

## 2024-03-07 DIAGNOSIS — Z992 Dependence on renal dialysis: Secondary | ICD-10-CM | POA: Diagnosis not present

## 2024-03-08 DIAGNOSIS — N186 End stage renal disease: Secondary | ICD-10-CM | POA: Diagnosis not present

## 2024-03-08 DIAGNOSIS — Z992 Dependence on renal dialysis: Secondary | ICD-10-CM | POA: Diagnosis not present

## 2024-03-09 DIAGNOSIS — Z992 Dependence on renal dialysis: Secondary | ICD-10-CM | POA: Diagnosis not present

## 2024-03-09 DIAGNOSIS — N186 End stage renal disease: Secondary | ICD-10-CM | POA: Diagnosis not present

## 2024-03-10 DIAGNOSIS — Z923 Personal history of irradiation: Secondary | ICD-10-CM | POA: Diagnosis not present

## 2024-03-10 DIAGNOSIS — N186 End stage renal disease: Secondary | ICD-10-CM | POA: Diagnosis not present

## 2024-03-10 DIAGNOSIS — Z9049 Acquired absence of other specified parts of digestive tract: Secondary | ICD-10-CM | POA: Diagnosis not present

## 2024-03-10 DIAGNOSIS — C07 Malignant neoplasm of parotid gland: Secondary | ICD-10-CM | POA: Diagnosis not present

## 2024-03-10 DIAGNOSIS — H903 Sensorineural hearing loss, bilateral: Secondary | ICD-10-CM | POA: Diagnosis not present

## 2024-03-10 DIAGNOSIS — Z992 Dependence on renal dialysis: Secondary | ICD-10-CM | POA: Diagnosis not present

## 2024-03-10 DIAGNOSIS — N185 Chronic kidney disease, stage 5: Secondary | ICD-10-CM | POA: Diagnosis not present

## 2024-03-11 DIAGNOSIS — Z992 Dependence on renal dialysis: Secondary | ICD-10-CM | POA: Diagnosis not present

## 2024-03-11 DIAGNOSIS — N186 End stage renal disease: Secondary | ICD-10-CM | POA: Diagnosis not present

## 2024-03-12 DIAGNOSIS — Z992 Dependence on renal dialysis: Secondary | ICD-10-CM | POA: Diagnosis not present

## 2024-03-12 DIAGNOSIS — N186 End stage renal disease: Secondary | ICD-10-CM | POA: Diagnosis not present

## 2024-03-13 DIAGNOSIS — N186 End stage renal disease: Secondary | ICD-10-CM | POA: Diagnosis not present

## 2024-03-13 DIAGNOSIS — Z992 Dependence on renal dialysis: Secondary | ICD-10-CM | POA: Diagnosis not present

## 2024-03-14 DIAGNOSIS — Z992 Dependence on renal dialysis: Secondary | ICD-10-CM | POA: Diagnosis not present

## 2024-03-14 DIAGNOSIS — N186 End stage renal disease: Secondary | ICD-10-CM | POA: Diagnosis not present

## 2024-03-15 DIAGNOSIS — Z992 Dependence on renal dialysis: Secondary | ICD-10-CM | POA: Diagnosis not present

## 2024-03-15 DIAGNOSIS — N186 End stage renal disease: Secondary | ICD-10-CM | POA: Diagnosis not present

## 2024-03-16 DIAGNOSIS — Z992 Dependence on renal dialysis: Secondary | ICD-10-CM | POA: Diagnosis not present

## 2024-03-16 DIAGNOSIS — N186 End stage renal disease: Secondary | ICD-10-CM | POA: Diagnosis not present

## 2024-03-17 DIAGNOSIS — N186 End stage renal disease: Secondary | ICD-10-CM | POA: Diagnosis not present

## 2024-03-17 DIAGNOSIS — Z992 Dependence on renal dialysis: Secondary | ICD-10-CM | POA: Diagnosis not present

## 2024-03-18 DIAGNOSIS — N186 End stage renal disease: Secondary | ICD-10-CM | POA: Diagnosis not present

## 2024-03-18 DIAGNOSIS — Z992 Dependence on renal dialysis: Secondary | ICD-10-CM | POA: Diagnosis not present

## 2024-03-19 DIAGNOSIS — Z992 Dependence on renal dialysis: Secondary | ICD-10-CM | POA: Diagnosis not present

## 2024-03-19 DIAGNOSIS — N186 End stage renal disease: Secondary | ICD-10-CM | POA: Diagnosis not present

## 2024-03-20 DIAGNOSIS — Z992 Dependence on renal dialysis: Secondary | ICD-10-CM | POA: Diagnosis not present

## 2024-03-20 DIAGNOSIS — N186 End stage renal disease: Secondary | ICD-10-CM | POA: Diagnosis not present

## 2024-03-21 DIAGNOSIS — Z992 Dependence on renal dialysis: Secondary | ICD-10-CM | POA: Diagnosis not present

## 2024-03-21 DIAGNOSIS — N186 End stage renal disease: Secondary | ICD-10-CM | POA: Diagnosis not present

## 2024-03-22 DIAGNOSIS — N186 End stage renal disease: Secondary | ICD-10-CM | POA: Diagnosis not present

## 2024-03-22 DIAGNOSIS — Z992 Dependence on renal dialysis: Secondary | ICD-10-CM | POA: Diagnosis not present

## 2024-03-23 DIAGNOSIS — Z992 Dependence on renal dialysis: Secondary | ICD-10-CM | POA: Diagnosis not present

## 2024-03-23 DIAGNOSIS — N186 End stage renal disease: Secondary | ICD-10-CM | POA: Diagnosis not present

## 2024-03-24 DIAGNOSIS — Z992 Dependence on renal dialysis: Secondary | ICD-10-CM | POA: Diagnosis not present

## 2024-03-24 DIAGNOSIS — N186 End stage renal disease: Secondary | ICD-10-CM | POA: Diagnosis not present

## 2024-03-25 DIAGNOSIS — N186 End stage renal disease: Secondary | ICD-10-CM | POA: Diagnosis not present

## 2024-03-25 DIAGNOSIS — Z992 Dependence on renal dialysis: Secondary | ICD-10-CM | POA: Diagnosis not present

## 2024-03-26 DIAGNOSIS — N186 End stage renal disease: Secondary | ICD-10-CM | POA: Diagnosis not present

## 2024-03-26 DIAGNOSIS — Z992 Dependence on renal dialysis: Secondary | ICD-10-CM | POA: Diagnosis not present

## 2024-03-27 DIAGNOSIS — N186 End stage renal disease: Secondary | ICD-10-CM | POA: Diagnosis not present

## 2024-03-27 DIAGNOSIS — Z992 Dependence on renal dialysis: Secondary | ICD-10-CM | POA: Diagnosis not present

## 2024-03-28 DIAGNOSIS — Z992 Dependence on renal dialysis: Secondary | ICD-10-CM | POA: Diagnosis not present

## 2024-03-28 DIAGNOSIS — N186 End stage renal disease: Secondary | ICD-10-CM | POA: Diagnosis not present

## 2024-03-29 DIAGNOSIS — Z992 Dependence on renal dialysis: Secondary | ICD-10-CM | POA: Diagnosis not present

## 2024-03-29 DIAGNOSIS — N186 End stage renal disease: Secondary | ICD-10-CM | POA: Diagnosis not present

## 2024-03-30 DIAGNOSIS — E039 Hypothyroidism, unspecified: Secondary | ICD-10-CM | POA: Diagnosis not present

## 2024-03-30 DIAGNOSIS — N186 End stage renal disease: Secondary | ICD-10-CM | POA: Diagnosis not present

## 2024-03-30 DIAGNOSIS — Z992 Dependence on renal dialysis: Secondary | ICD-10-CM | POA: Diagnosis not present

## 2024-03-31 DIAGNOSIS — Z992 Dependence on renal dialysis: Secondary | ICD-10-CM | POA: Diagnosis not present

## 2024-03-31 DIAGNOSIS — N186 End stage renal disease: Secondary | ICD-10-CM | POA: Diagnosis not present

## 2024-04-01 DIAGNOSIS — N186 End stage renal disease: Secondary | ICD-10-CM | POA: Diagnosis not present

## 2024-04-01 DIAGNOSIS — Z992 Dependence on renal dialysis: Secondary | ICD-10-CM | POA: Diagnosis not present

## 2024-04-02 DIAGNOSIS — N186 End stage renal disease: Secondary | ICD-10-CM | POA: Diagnosis not present

## 2024-04-02 DIAGNOSIS — Z992 Dependence on renal dialysis: Secondary | ICD-10-CM | POA: Diagnosis not present

## 2024-04-03 DIAGNOSIS — N186 End stage renal disease: Secondary | ICD-10-CM | POA: Diagnosis not present

## 2024-04-03 DIAGNOSIS — Z992 Dependence on renal dialysis: Secondary | ICD-10-CM | POA: Diagnosis not present

## 2024-04-04 DIAGNOSIS — Z992 Dependence on renal dialysis: Secondary | ICD-10-CM | POA: Diagnosis not present

## 2024-04-04 DIAGNOSIS — N186 End stage renal disease: Secondary | ICD-10-CM | POA: Diagnosis not present

## 2024-04-05 DIAGNOSIS — Z992 Dependence on renal dialysis: Secondary | ICD-10-CM | POA: Diagnosis not present

## 2024-04-05 DIAGNOSIS — N186 End stage renal disease: Secondary | ICD-10-CM | POA: Diagnosis not present

## 2024-04-06 ENCOUNTER — Telehealth: Payer: Self-pay

## 2024-04-06 DIAGNOSIS — Z131 Encounter for screening for diabetes mellitus: Secondary | ICD-10-CM | POA: Diagnosis not present

## 2024-04-06 DIAGNOSIS — Z992 Dependence on renal dialysis: Secondary | ICD-10-CM | POA: Diagnosis not present

## 2024-04-06 DIAGNOSIS — N186 End stage renal disease: Secondary | ICD-10-CM | POA: Diagnosis not present

## 2024-04-06 NOTE — Telephone Encounter (Signed)
 Gave pt a call to let him know we will be mailing out PAP [employer]  to expect it in the mail. Pt is aware,faxing provider portion today.

## 2024-04-07 DIAGNOSIS — N186 End stage renal disease: Secondary | ICD-10-CM | POA: Diagnosis not present

## 2024-04-07 DIAGNOSIS — Z992 Dependence on renal dialysis: Secondary | ICD-10-CM | POA: Diagnosis not present

## 2024-04-08 DIAGNOSIS — N186 End stage renal disease: Secondary | ICD-10-CM | POA: Diagnosis not present

## 2024-04-08 DIAGNOSIS — Z992 Dependence on renal dialysis: Secondary | ICD-10-CM | POA: Diagnosis not present

## 2024-04-09 DIAGNOSIS — Z992 Dependence on renal dialysis: Secondary | ICD-10-CM | POA: Diagnosis not present

## 2024-04-09 DIAGNOSIS — N186 End stage renal disease: Secondary | ICD-10-CM | POA: Diagnosis not present

## 2024-04-10 DIAGNOSIS — N186 End stage renal disease: Secondary | ICD-10-CM | POA: Diagnosis not present

## 2024-04-10 DIAGNOSIS — Z992 Dependence on renal dialysis: Secondary | ICD-10-CM | POA: Diagnosis not present

## 2024-04-11 DIAGNOSIS — Z992 Dependence on renal dialysis: Secondary | ICD-10-CM | POA: Diagnosis not present

## 2024-04-11 DIAGNOSIS — N186 End stage renal disease: Secondary | ICD-10-CM | POA: Diagnosis not present

## 2024-04-12 DIAGNOSIS — N186 End stage renal disease: Secondary | ICD-10-CM | POA: Diagnosis not present

## 2024-04-12 DIAGNOSIS — Z992 Dependence on renal dialysis: Secondary | ICD-10-CM | POA: Diagnosis not present

## 2024-04-13 ENCOUNTER — Encounter: Payer: Self-pay | Admitting: Family Medicine

## 2024-04-13 ENCOUNTER — Ambulatory Visit (INDEPENDENT_AMBULATORY_CARE_PROVIDER_SITE_OTHER): Admitting: Family Medicine

## 2024-04-13 VITALS — BP 130/72 | HR 59 | Temp 98.0°F | Ht 68.0 in | Wt 136.0 lb

## 2024-04-13 DIAGNOSIS — R5383 Other fatigue: Secondary | ICD-10-CM

## 2024-04-13 DIAGNOSIS — N186 End stage renal disease: Secondary | ICD-10-CM | POA: Diagnosis not present

## 2024-04-13 DIAGNOSIS — Z992 Dependence on renal dialysis: Secondary | ICD-10-CM

## 2024-04-13 DIAGNOSIS — R7303 Prediabetes: Secondary | ICD-10-CM | POA: Diagnosis not present

## 2024-04-13 DIAGNOSIS — E039 Hypothyroidism, unspecified: Secondary | ICD-10-CM | POA: Diagnosis not present

## 2024-04-13 DIAGNOSIS — I10 Essential (primary) hypertension: Secondary | ICD-10-CM

## 2024-04-13 DIAGNOSIS — I5022 Chronic systolic (congestive) heart failure: Secondary | ICD-10-CM

## 2024-04-13 MED ORDER — LORAZEPAM 0.5 MG PO TABS: 0.5000 mg | ORAL_TABLET | Freq: Two times a day (BID) | ORAL | 0 refills | Status: AC | PRN

## 2024-04-13 NOTE — Progress Notes (Signed)
 Subjective:    Patient ID: Bradley Black, male    DOB: 1946/01/31, 78 y.o.   MRN: [id number]   Patient is here today for follow-up.  He has a history of end-stage renal disease requiring peritoneal dialysis on a nightly basis.  He has been maintaining his weight between 59 and 60 kg consistently.  Recently saw his nephrologist who ordered a battery of blood test.  Including a hemoglobin A1c of 5.4, hemoglobin of 11.1, PTH of 269.  The patient's potassium level was 4.8.  Patient states that he also checked his TSH which was within normal limits.  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  He states that Ativan  works well for him.  He will take an Ativan  and within 2 minutes the tremor would subside.  He would use this medicine once or twice a week at most.  He would like a refill on that.  He continues to report staggering and poor balance but he associates this with muscle weakness.  He states he feels extremely tired and weak.  This is likely multifactorial.  His labs have been checked recently and are within normal limits considering his underlying kidney failure.  His B12 levels were checked a year ago and were normal.  We have never checked his testosterone  level.  He denies any chest pain or shortness of breath.  Since I last saw the patient, he underwent resection of parotid cancer.  He is also underwent local radiation therapy and has been deemed cancer free  Past Medical History:  Diagnosis Date   BPH (benign prostatic hyperplasia)    CAD S/P percutaneous coronary angioplasty    a. PTCA of OM 1988 & 1994 b. PCI with BMS to LAD in 1997 c. RCA PCI BMS 1999 & 2000 d. s/p CABG in 11/2011 with LIMA-LAD, SVG-PDA, SVG-OM2, and SVG-D1   Cataract    Chronic back pain    CKD (chronic kidney disease), stage III (HCC)    Cyst of bursa    R shoulder   Diabetic retinopathy (HCC)    DM (diabetes mellitus), type 2 with renal complications (HCC)    Essential  hypertension    Frequent PVCs    GERD (gastroesophageal reflux disease)    Hypertensive retinopathy    Ischemic cardiomyopathy 11/2011   Intra-OP TEE: EF 40-45%, no regional WMA; improved Anterior WM post CABG.   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   LIMA to LAD, SVG to D1, SVG to OM2, SVG to PDA, EVH via right thigh and leg   Shingles    Past Surgical History:  Procedure Laterality Date   AV FISTULA PLACEMENT Left 07/28/2020   Procedure: LEFT ARM ARTERIOVENOUS (AV) FISTULA  CREATION;  Surgeon: Oris Krystal FALCON, MD;  Location: [facility];  Service: Vascular;  Laterality: Left;   BACK SURGERY  10/01/1968   BIOPSY  01/25/2022   Procedure: BIOPSY;  Surgeon: Shaaron Lamar HERO, MD;  Location: [facility];  Service: Endoscopy;;   CARDIAC CATHETERIZATION  10/02/2011   CATARACT EXTRACTION W/PHACO Left 09/14/2022   Procedure: CATARACT EXTRACTION PHACO AND INTRAOCULAR LENS PLACEMENT (IOC);  Surgeon: Harrie Agent, MD;  Location: [facility];  Service: Ophthalmology;  Laterality: Left;  CDE: 8.66   CATARACT EXTRACTION W/PHACO Right 09/28/2022   Procedure:  CATARACT EXTRACTION PHACO AND INTRAOCULAR LENS PLACEMENT (IOC);  Surgeon: Harrie Agent, MD;  Location: [facility];  Service: Ophthalmology;  Laterality: Right;  CDE 7.79   COLONOSCOPY WITH PROPOFOL  N/A 01/25/2022   Procedure: COLONOSCOPY WITH PROPOFOL ;  Surgeon: Shaaron Lamar HERO, MD;  Location: [facility];  Service: Endoscopy;  Laterality: N/A;  10:30am   CORONARY ANGIOPLASTY  10/01/1992   OM   CORONARY ANGIOPLASTY  10/02/1995   LAD   CORONARY ANGIOPLASTY WITH STENT PLACEMENT  10/01/1997   RCA   CORONARY ANGIOPLASTY WITH STENT PLACEMENT  10/02/1999   RCA   CORONARY ARTERY BYPASS GRAFT  12/27/2011   Procedure: CORONARY ARTERY BYPASS GRAFTING (CABG);  Surgeon: Sudie VEAR Laine, MD;  Location: [facility] OR;  Service: Open Heart  Surgery;  Laterality: N/A;  Times four. On pump. Using endoscopically harvested right greater saphenous vein and left internal mammary artery.    CORONARY STENT INTERVENTION N/A 07/25/2023   Procedure: CORONARY STENT INTERVENTION;  Surgeon: Verlin Lonni BIRCH, MD;  Location: [facility] INVAS[facility] CV LAB;  Service: Cardiovascular;  Laterality: N/A;   ESOPHAGOGASTRODUODENOSCOPY (EGD) WITH PROPOFOL  N/A 01/25/2022   Procedure: ESOPHAGOGASTRODUODENOSCOPY (EGD) WITH PROPOFOL ;  Surgeon: Shaaron Lamar HERO, MD;  Location: [facility];  Service: Endoscopy;  Laterality: N/A;   HERNIA REPAIR Bilateral    INCISION AND DRAINAGE OF WOUND  10/01/2004   axilla   INSERTION OF DIALYSIS CATHETER Right 07/25/2020   Procedure: INSERTION OF DIALYSIS CATHETER;  Surgeon: Kallie Manuelita BROCKS, MD;  Location: [facility];  Service: General;  Laterality: Right;   INTRAOPERATIVE TRANSESOPHAGEAL ECHOCARDIOGRAM  12/27/2011   Global hypokinesis with EF of 40-45%, improved LAD distribution wall motion.   LEFT HEART CATH AND CORS/GRAFTS ANGIOGRAPHY N/A 07/25/2023   Procedure: LEFT HEART CATH AND CORS/GRAFTS ANGIOGRAPHY;  Surgeon: Verlin Lonni BIRCH, MD;  Location: [facility];  Service: Cardiovascular;  Laterality: N/A;   LEFT HEART CATHETERIZATION WITH CORONARY ANGIOGRAM N/A 12/13/2011   Procedure: LEFT HEART CATHETERIZATION WITH CORONARY ANGIOGRAM;  Surgeon: Alm LELON Clay, MD;  Location: [facility] CATH LAB;  Service: Cardiovascular;  Laterality: N/A;   LUMBAR FUSION     MASS EXCISION  10/24/2011   R arm   PAROTIDECTOMY Right 07/05/2023   Procedure: 1. Right radical parotidectomy with wide local excision facial skin 3x3cm, dissection and preservation of facial nerve (CPT 42415, 11644);  Surgeon: Luciano Standing, MD;  Location: [facility] OR;  Service: ENT;  Laterality: Right;   POLYPECTOMY  01/25/2022   Procedure: POLYPECTOMY;  Surgeon: Shaaron Lamar HERO, MD;  [facility];  Service: Endoscopy;;   RADICAL NECK DISSECTION Right  07/05/2023   Procedure: 2. Right modified radical neck dissection 1-3 (CPT [id number]) 3. Cervicofacial rotation advancement flap 10x10cm for closure of 3x3cm defect (CPT 14040);  Surgeon: Luciano Standing, MD;  Location: [facility] OR;  Service: ENT;  Laterality: Right;   RIGHT HEART CATH N/A 07/12/2020   Procedure: RIGHT HEART CATH;  Surgeon: Claudene Victory LELON, MD;  Location[facility] INVASIVE CV LAB;  Service: Cardiovascular;  Laterality: N/A;   TRANSESOPHAGEAL ECHOCARDIOGRAM  10/02/2011   Current Outpatient Medications on File Prior to Visit  Medication Sig Dispense Refill   amLODipine  (NORVASC ) 5 MG tablet TAKE ONE TABLET (5MG  TOTAL) BY MOUTH DAILY 90 tablet 1   aspirin  EC 81 MG tablet Take 81 mg by mouth daily.     atorvastatin  (LIPITOR) 40 MG tablet Take 1 tablet (40 mg total) by mouth daily. 90 tablet 3   Cholecalciferol (VITAMIN D3) 25 MCG (1000  UT) CAPS Take 1 capsule (1,000 Units total) by mouth daily at 6 (six) AM. 60 capsule 6   clopidogrel  (PLAVIX ) 75 MG tablet Take 1 tablet (75 mg total) by mouth daily. 90 tablet 1   doxazosin  (CARDURA ) 8 MG tablet Take 8 mg by mouth daily.     ferrous sulfate 325 (65 FE) MG tablet Take 325 mg by mouth daily with breakfast.     hydrALAZINE  (APRESOLINE ) 50 MG tablet TAKE ONE AND ONE-HALF TABLETS (75MG  TOTAL) BY MOUTH THREE TIMES DAILY 270 tablet 1   ketorolac  (ACULAR ) 0.5 % ophthalmic solution Place 1 drop into both eyes in the morning, at noon, and at bedtime.     levothyroxine  (SYNTHROID ) 50 MCG tablet TAKE ONE TABLET ( TOTAL) BY MOUTH DAILY BEFORE BREAKFAST 90 tablet 1   LORazepam  (ATIVAN ) 0.5 MG tablet Take 1 tablet (0.5 mg total) by mouth 2 (two) times daily as needed (tremors). 10 tablet 0   multivitamin (RENA-VIT) TABS tablet Take 1 tablet by mouth daily.     pantoprazole  (PROTONIX ) 40 MG tablet TAKE ONE TABLET BY MOUTH TWICE A DAY 180 tablet 0   sacubitril -valsartan  (ENTRESTO ) 97-103 MG TAKE ONE TABLET BY MOUTH TWICE A DAY 60 tablet 3   spironolactone   (ALDACTONE ) 25 MG tablet TAKE 0.5 TABLETS (12.5 MG TOTAL) BY MOUTH DAILY 15 tablet 6   torsemide  (DEMADEX ) 20 MG tablet Take 20 mg by mouth daily.     No current facility-administered medications on file prior to visit.   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  Tobacco Use   Smoking status: Former    Current packs/day: 0.00    Average packs/day: 1 pack/day for 15.0 years (15.0 ttl pk-yrs)    Types: Cigarettes    Start date: 10/02/1971    Quit date: 10/01/1986    Years since quitting: 37.5   Smokeless tobacco: Never   Tobacco comments:    quit about 30 yrs ago  Vaping Use   Vaping status: Never Used  Substance and Sexual Activity   Alcohol use: No   Drug use: No   Sexual activity: Not Currently  Other Topics Concern   Not on file  Social History Narrative   Married,  father of one,  grandfather 1. Works out at [employer] at [employer] roughly 2-3 days a week. He works on a treadmill. He does note having a hard time getting his heart rate up.   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   He quit smoking in 1988, and does not drink alcohol.   Social Drivers of Corporate investment banker Strain: Low Risk  (12/12/2023)   Overall Financial Resource Strain (CARDIA)    Difficulty of Paying Living Expenses: Not hard at all  Food Insecurity: No Food Insecurity (12/12/2023)   Hunger Vital Sign    Worried About Running Out of Food in the Last Year: Never true    Ran Out of Food in the Last Year: Never true  Transportation Needs: No Transportation Needs (12/12/2023)   PRAPARE - Administrator, Civil Service (Medical): No    Lack of Transportation (Non-Medical): No  Physical Activity: Sufficiently Active  (12/12/2023)  Exercise Vital Sign    Days of Exercise per Week: 4 days    Minutes of Exercise per Session: 40 min  Stress: No Stress Concern Present (12/12/2023)   [employer] of Occupational Health - Occupational Stress Questionnaire    Feeling of Stress : Not at all  Social Connections: Moderately Integrated (12/12/2023)   Social Connection and Isolation Panel    Frequency of Communication with Friends and Family: More than three times a week    Frequency of Social Gatherings with Friends and Family: Once a week    Attends Religious Services: More than 4 times per year    Active Member of [employer] or Organizations: No    Attends Banker Meetings: Never    Marital Status: Married  Catering manager Violence: Not At Risk (12/12/2023)   Humiliation, Afraid, Rape, and Kick questionnaire    Fear of Current or Ex-Partner: No    Emotionally Abused: No    Physically Abused: No    Sexually Abused: No      Review of Systems  All other systems reviewed and are negative.      Objective:   Physical Exam Vitals reviewed.  Constitutional:      Appearance: Normal appearance.  Cardiovascular:     Rate and Rhythm: Normal rate and regular rhythm.     Heart sounds: Normal heart sounds. No murmur heard. Pulmonary:     Effort: Pulmonary effort is normal. No respiratory distress.     Breath sounds: Normal breath sounds. No stridor. No wheezing, rhonchi or rales.  Abdominal:     General: Bowel sounds are normal. There is no distension.     Palpations: Abdomen is soft.     Tenderness: There is no abdominal tenderness.  Skin:    Findings: No erythema or rash.  Neurological:     General: No focal deficit present.     Mental Status: He is alert and oriented to person, place, and time. Mental status is at baseline.     Cranial Nerves: No cranial nerve deficit.     Sensory: No sensory deficit.     Motor: No weakness.     Coordination: Romberg sign negative. Coordination normal.  Finger-Nose-Finger Test normal.     Gait: Gait abnormal.     Deep Tendon Reflexes: Reflexes normal.           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  We will treat this with Ativan  0.5 mg every 8 hours as needed.  I will give him 60 tablets.  This will likely last the patient a year.  I believe that his ataxia is more disequilibrium weakness and deconditioning then any other underlying neurologic disease.  I believe that this is likely multifactorial and related to age, medication, his underlying kidney failure etc.  However I will check his testosterone  level to rule out hypergonadism.  His recent lab work including an A1c, TSH, and hemoglobin along with potassium within normal range.

## 2024-04-13 NOTE — Telephone Encounter (Signed)
 Received provider portion today,waiting on pt portion.

## 2024-04-14 ENCOUNTER — Ambulatory Visit: Payer: Self-pay | Admitting: Family Medicine

## 2024-04-14 DIAGNOSIS — H6121 Impacted cerumen, right ear: Secondary | ICD-10-CM | POA: Diagnosis not present

## 2024-04-14 DIAGNOSIS — N186 End stage renal disease: Secondary | ICD-10-CM | POA: Diagnosis not present

## 2024-04-14 DIAGNOSIS — Z992 Dependence on renal dialysis: Secondary | ICD-10-CM | POA: Diagnosis not present

## 2024-04-14 DIAGNOSIS — H6122 Impacted cerumen, left ear: Secondary | ICD-10-CM | POA: Diagnosis not present

## 2024-04-14 LAB — TESTOSTERONE TOTAL,FREE,BIO, MALES
Albumin: 3.9 g/dL (ref 3.6–5.1)
Sex Hormone Binding: 68 nmol/L (ref 22–77)
Testosterone, Bioavailable: 68.7 ng/dL (ref 15.0–150.0)
Testosterone, Free: 38.3 pg/mL (ref 6.0–73.0)
Testosterone: 528 ng/dL (ref 250–827)

## 2024-04-15 DIAGNOSIS — N186 End stage renal disease: Secondary | ICD-10-CM | POA: Diagnosis not present

## 2024-04-15 DIAGNOSIS — Z992 Dependence on renal dialysis: Secondary | ICD-10-CM | POA: Diagnosis not present

## 2024-04-16 DIAGNOSIS — Z992 Dependence on renal dialysis: Secondary | ICD-10-CM | POA: Diagnosis not present

## 2024-04-16 DIAGNOSIS — N186 End stage renal disease: Secondary | ICD-10-CM | POA: Diagnosis not present

## 2024-04-17 DIAGNOSIS — N186 End stage renal disease: Secondary | ICD-10-CM | POA: Diagnosis not present

## 2024-04-17 DIAGNOSIS — Z992 Dependence on renal dialysis: Secondary | ICD-10-CM | POA: Diagnosis not present

## 2024-04-18 DIAGNOSIS — Z992 Dependence on renal dialysis: Secondary | ICD-10-CM | POA: Diagnosis not present

## 2024-04-18 DIAGNOSIS — N186 End stage renal disease: Secondary | ICD-10-CM | POA: Diagnosis not present

## 2024-04-19 DIAGNOSIS — Z992 Dependence on renal dialysis: Secondary | ICD-10-CM | POA: Diagnosis not present

## 2024-04-19 DIAGNOSIS — N186 End stage renal disease: Secondary | ICD-10-CM | POA: Diagnosis not present

## 2024-04-20 DIAGNOSIS — Z992 Dependence on renal dialysis: Secondary | ICD-10-CM | POA: Diagnosis not present

## 2024-04-20 DIAGNOSIS — N186 End stage renal disease: Secondary | ICD-10-CM | POA: Diagnosis not present

## 2024-04-20 NOTE — Telephone Encounter (Signed)
 Reached out to patient regarding PAP application for Entresto  ([employer]) and he replied the Provider has taken him off Entresto  for now- if it is resumed he will fill out the app and return to us .

## 2024-04-21 DIAGNOSIS — N186 End stage renal disease: Secondary | ICD-10-CM | POA: Diagnosis not present

## 2024-04-21 DIAGNOSIS — Z992 Dependence on renal dialysis: Secondary | ICD-10-CM | POA: Diagnosis not present

## 2024-04-22 ENCOUNTER — Other Ambulatory Visit: Payer: Self-pay | Admitting: Family Medicine

## 2024-04-22 DIAGNOSIS — Z992 Dependence on renal dialysis: Secondary | ICD-10-CM | POA: Diagnosis not present

## 2024-04-22 DIAGNOSIS — N186 End stage renal disease: Secondary | ICD-10-CM | POA: Diagnosis not present

## 2024-04-23 DIAGNOSIS — Z992 Dependence on renal dialysis: Secondary | ICD-10-CM | POA: Diagnosis not present

## 2024-04-23 DIAGNOSIS — N186 End stage renal disease: Secondary | ICD-10-CM | POA: Diagnosis not present

## 2024-04-24 DIAGNOSIS — Z992 Dependence on renal dialysis: Secondary | ICD-10-CM | POA: Diagnosis not present

## 2024-04-24 DIAGNOSIS — N186 End stage renal disease: Secondary | ICD-10-CM | POA: Diagnosis not present

## 2024-04-25 DIAGNOSIS — N186 End stage renal disease: Secondary | ICD-10-CM | POA: Diagnosis not present

## 2024-04-25 DIAGNOSIS — Z992 Dependence on renal dialysis: Secondary | ICD-10-CM | POA: Diagnosis not present

## 2024-04-26 DIAGNOSIS — Z992 Dependence on renal dialysis: Secondary | ICD-10-CM | POA: Diagnosis not present

## 2024-04-26 DIAGNOSIS — N186 End stage renal disease: Secondary | ICD-10-CM | POA: Diagnosis not present

## 2024-04-27 DIAGNOSIS — N186 End stage renal disease: Secondary | ICD-10-CM | POA: Diagnosis not present

## 2024-04-27 DIAGNOSIS — Z992 Dependence on renal dialysis: Secondary | ICD-10-CM | POA: Diagnosis not present

## 2024-04-28 DIAGNOSIS — Z992 Dependence on renal dialysis: Secondary | ICD-10-CM | POA: Diagnosis not present

## 2024-04-28 DIAGNOSIS — N186 End stage renal disease: Secondary | ICD-10-CM | POA: Diagnosis not present

## 2024-04-29 DIAGNOSIS — Z992 Dependence on renal dialysis: Secondary | ICD-10-CM | POA: Diagnosis not present

## 2024-04-29 DIAGNOSIS — N186 End stage renal disease: Secondary | ICD-10-CM | POA: Diagnosis not present

## 2024-04-29 NOTE — Telephone Encounter (Signed)
 Pt has been take off Entresto  is no longer needing PAP.

## 2024-04-30 DIAGNOSIS — N186 End stage renal disease: Secondary | ICD-10-CM | POA: Diagnosis not present

## 2024-04-30 DIAGNOSIS — Z992 Dependence on renal dialysis: Secondary | ICD-10-CM | POA: Diagnosis not present

## 2024-05-01 DIAGNOSIS — N186 End stage renal disease: Secondary | ICD-10-CM | POA: Diagnosis not present

## 2024-05-01 DIAGNOSIS — Z992 Dependence on renal dialysis: Secondary | ICD-10-CM | POA: Diagnosis not present

## 2024-05-02 DIAGNOSIS — N186 End stage renal disease: Secondary | ICD-10-CM | POA: Diagnosis not present

## 2024-05-02 DIAGNOSIS — Z992 Dependence on renal dialysis: Secondary | ICD-10-CM | POA: Diagnosis not present

## 2024-05-03 DIAGNOSIS — Z992 Dependence on renal dialysis: Secondary | ICD-10-CM | POA: Diagnosis not present

## 2024-05-03 DIAGNOSIS — N186 End stage renal disease: Secondary | ICD-10-CM | POA: Diagnosis not present

## 2024-05-04 DIAGNOSIS — Z992 Dependence on renal dialysis: Secondary | ICD-10-CM | POA: Diagnosis not present

## 2024-05-04 DIAGNOSIS — N186 End stage renal disease: Secondary | ICD-10-CM | POA: Diagnosis not present

## 2024-05-06 DIAGNOSIS — Z992 Dependence on renal dialysis: Secondary | ICD-10-CM | POA: Diagnosis not present

## 2024-05-06 DIAGNOSIS — N186 End stage renal disease: Secondary | ICD-10-CM | POA: Diagnosis not present

## 2024-05-07 DIAGNOSIS — N186 End stage renal disease: Secondary | ICD-10-CM | POA: Diagnosis not present

## 2024-05-07 DIAGNOSIS — Z992 Dependence on renal dialysis: Secondary | ICD-10-CM | POA: Diagnosis not present

## 2024-05-08 DIAGNOSIS — N186 End stage renal disease: Secondary | ICD-10-CM | POA: Diagnosis not present

## 2024-05-08 DIAGNOSIS — Z992 Dependence on renal dialysis: Secondary | ICD-10-CM | POA: Diagnosis not present

## 2024-05-09 DIAGNOSIS — N186 End stage renal disease: Secondary | ICD-10-CM | POA: Diagnosis not present

## 2024-05-09 DIAGNOSIS — Z992 Dependence on renal dialysis: Secondary | ICD-10-CM | POA: Diagnosis not present

## 2024-05-10 DIAGNOSIS — N186 End stage renal disease: Secondary | ICD-10-CM | POA: Diagnosis not present

## 2024-05-10 DIAGNOSIS — Z992 Dependence on renal dialysis: Secondary | ICD-10-CM | POA: Diagnosis not present

## 2024-05-11 DIAGNOSIS — Z992 Dependence on renal dialysis: Secondary | ICD-10-CM | POA: Diagnosis not present

## 2024-05-11 DIAGNOSIS — N186 End stage renal disease: Secondary | ICD-10-CM | POA: Diagnosis not present

## 2024-05-12 DIAGNOSIS — Z992 Dependence on renal dialysis: Secondary | ICD-10-CM | POA: Diagnosis not present

## 2024-05-12 DIAGNOSIS — N186 End stage renal disease: Secondary | ICD-10-CM | POA: Diagnosis not present

## 2024-05-12 DIAGNOSIS — M545 Low back pain, unspecified: Secondary | ICD-10-CM | POA: Diagnosis not present

## 2024-05-12 DIAGNOSIS — M25552 Pain in left hip: Secondary | ICD-10-CM | POA: Diagnosis not present

## 2024-05-13 DIAGNOSIS — N186 End stage renal disease: Secondary | ICD-10-CM | POA: Diagnosis not present

## 2024-05-13 DIAGNOSIS — Z992 Dependence on renal dialysis: Secondary | ICD-10-CM | POA: Diagnosis not present

## 2024-05-14 DIAGNOSIS — N186 End stage renal disease: Secondary | ICD-10-CM | POA: Diagnosis not present

## 2024-05-14 DIAGNOSIS — Z992 Dependence on renal dialysis: Secondary | ICD-10-CM | POA: Diagnosis not present

## 2024-05-15 DIAGNOSIS — Z992 Dependence on renal dialysis: Secondary | ICD-10-CM | POA: Diagnosis not present

## 2024-05-15 DIAGNOSIS — N186 End stage renal disease: Secondary | ICD-10-CM | POA: Diagnosis not present

## 2024-05-16 DIAGNOSIS — N186 End stage renal disease: Secondary | ICD-10-CM | POA: Diagnosis not present

## 2024-05-16 DIAGNOSIS — Z992 Dependence on renal dialysis: Secondary | ICD-10-CM | POA: Diagnosis not present

## 2024-05-17 DIAGNOSIS — Z992 Dependence on renal dialysis: Secondary | ICD-10-CM | POA: Diagnosis not present

## 2024-05-17 DIAGNOSIS — N186 End stage renal disease: Secondary | ICD-10-CM | POA: Diagnosis not present

## 2024-05-18 DIAGNOSIS — Z992 Dependence on renal dialysis: Secondary | ICD-10-CM | POA: Diagnosis not present

## 2024-05-18 DIAGNOSIS — N186 End stage renal disease: Secondary | ICD-10-CM | POA: Diagnosis not present

## 2024-05-19 DIAGNOSIS — N186 End stage renal disease: Secondary | ICD-10-CM | POA: Diagnosis not present

## 2024-05-19 DIAGNOSIS — Z992 Dependence on renal dialysis: Secondary | ICD-10-CM | POA: Diagnosis not present

## 2024-05-20 DIAGNOSIS — N186 End stage renal disease: Secondary | ICD-10-CM | POA: Diagnosis not present

## 2024-05-20 DIAGNOSIS — Z992 Dependence on renal dialysis: Secondary | ICD-10-CM | POA: Diagnosis not present

## 2024-05-21 DIAGNOSIS — Z992 Dependence on renal dialysis: Secondary | ICD-10-CM | POA: Diagnosis not present

## 2024-05-21 DIAGNOSIS — N186 End stage renal disease: Secondary | ICD-10-CM | POA: Diagnosis not present

## 2024-05-22 ENCOUNTER — Encounter: Payer: Self-pay | Admitting: Radiology

## 2024-05-22 DIAGNOSIS — Z992 Dependence on renal dialysis: Secondary | ICD-10-CM | POA: Diagnosis not present

## 2024-05-22 DIAGNOSIS — N186 End stage renal disease: Secondary | ICD-10-CM | POA: Diagnosis not present

## 2024-05-23 DIAGNOSIS — Z992 Dependence on renal dialysis: Secondary | ICD-10-CM | POA: Diagnosis not present

## 2024-05-23 DIAGNOSIS — N186 End stage renal disease: Secondary | ICD-10-CM | POA: Diagnosis not present

## 2024-05-24 DIAGNOSIS — N186 End stage renal disease: Secondary | ICD-10-CM | POA: Diagnosis not present

## 2024-05-24 DIAGNOSIS — Z992 Dependence on renal dialysis: Secondary | ICD-10-CM | POA: Diagnosis not present

## 2024-05-25 DIAGNOSIS — Z992 Dependence on renal dialysis: Secondary | ICD-10-CM | POA: Diagnosis not present

## 2024-05-25 DIAGNOSIS — N186 End stage renal disease: Secondary | ICD-10-CM | POA: Diagnosis not present

## 2024-05-26 DIAGNOSIS — Z992 Dependence on renal dialysis: Secondary | ICD-10-CM | POA: Diagnosis not present

## 2024-05-26 DIAGNOSIS — N186 End stage renal disease: Secondary | ICD-10-CM | POA: Diagnosis not present

## 2024-05-27 DIAGNOSIS — Z992 Dependence on renal dialysis: Secondary | ICD-10-CM | POA: Diagnosis not present

## 2024-05-27 DIAGNOSIS — N186 End stage renal disease: Secondary | ICD-10-CM | POA: Diagnosis not present

## 2024-05-28 DIAGNOSIS — Z992 Dependence on renal dialysis: Secondary | ICD-10-CM | POA: Diagnosis not present

## 2024-05-28 DIAGNOSIS — N186 End stage renal disease: Secondary | ICD-10-CM | POA: Diagnosis not present

## 2024-05-29 DIAGNOSIS — N186 End stage renal disease: Secondary | ICD-10-CM | POA: Diagnosis not present

## 2024-05-29 DIAGNOSIS — Z992 Dependence on renal dialysis: Secondary | ICD-10-CM | POA: Diagnosis not present

## 2024-05-30 DIAGNOSIS — N186 End stage renal disease: Secondary | ICD-10-CM | POA: Diagnosis not present

## 2024-05-30 DIAGNOSIS — Z992 Dependence on renal dialysis: Secondary | ICD-10-CM | POA: Diagnosis not present

## 2024-05-31 DIAGNOSIS — N186 End stage renal disease: Secondary | ICD-10-CM | POA: Diagnosis not present

## 2024-05-31 DIAGNOSIS — Z992 Dependence on renal dialysis: Secondary | ICD-10-CM | POA: Diagnosis not present

## 2024-06-01 DIAGNOSIS — N186 End stage renal disease: Secondary | ICD-10-CM | POA: Diagnosis not present

## 2024-06-01 DIAGNOSIS — Z992 Dependence on renal dialysis: Secondary | ICD-10-CM | POA: Diagnosis not present

## 2024-06-02 DIAGNOSIS — N186 End stage renal disease: Secondary | ICD-10-CM | POA: Diagnosis not present

## 2024-06-02 DIAGNOSIS — Z992 Dependence on renal dialysis: Secondary | ICD-10-CM | POA: Diagnosis not present

## 2024-06-03 DIAGNOSIS — N186 End stage renal disease: Secondary | ICD-10-CM | POA: Diagnosis not present

## 2024-06-03 DIAGNOSIS — Z992 Dependence on renal dialysis: Secondary | ICD-10-CM | POA: Diagnosis not present

## 2024-06-04 ENCOUNTER — Ambulatory Visit (HOSPITAL_COMMUNITY): Attending: Nephrology

## 2024-06-04 ENCOUNTER — Encounter (HOSPITAL_COMMUNITY): Payer: Self-pay

## 2024-06-04 ENCOUNTER — Other Ambulatory Visit: Payer: Self-pay | Admitting: Family Medicine

## 2024-06-04 ENCOUNTER — Other Ambulatory Visit: Payer: Self-pay

## 2024-06-04 DIAGNOSIS — Z992 Dependence on renal dialysis: Secondary | ICD-10-CM | POA: Diagnosis not present

## 2024-06-04 DIAGNOSIS — N186 End stage renal disease: Secondary | ICD-10-CM | POA: Diagnosis not present

## 2024-06-04 DIAGNOSIS — M542 Cervicalgia: Secondary | ICD-10-CM | POA: Insufficient documentation

## 2024-06-04 DIAGNOSIS — G8929 Other chronic pain: Secondary | ICD-10-CM | POA: Diagnosis not present

## 2024-06-04 DIAGNOSIS — R293 Abnormal posture: Secondary | ICD-10-CM | POA: Diagnosis not present

## 2024-06-04 DIAGNOSIS — M546 Pain in thoracic spine: Secondary | ICD-10-CM | POA: Insufficient documentation
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
## 2024-06-05 DIAGNOSIS — N186 End stage renal disease: Secondary | ICD-10-CM | POA: Diagnosis not present

## 2024-06-05 DIAGNOSIS — Z992 Dependence on renal dialysis: Secondary | ICD-10-CM | POA: Diagnosis not present

## 2024-06-06 DIAGNOSIS — Z992 Dependence on renal dialysis: Secondary | ICD-10-CM | POA: Diagnosis not present

## 2024-06-06 DIAGNOSIS — N186 End stage renal disease: Secondary | ICD-10-CM | POA: Diagnosis not present

## 2024-06-07 DIAGNOSIS — Z992 Dependence on renal dialysis: Secondary | ICD-10-CM | POA: Diagnosis not present

## 2024-06-07 DIAGNOSIS — N186 End stage renal disease: Secondary | ICD-10-CM | POA: Diagnosis not present

## 2024-06-08 ENCOUNTER — Ambulatory Visit (HOSPITAL_COMMUNITY)

## 2024-06-08 DIAGNOSIS — H903 Sensorineural hearing loss, bilateral: Secondary | ICD-10-CM | POA: Diagnosis not present

## 2024-06-08 DIAGNOSIS — Z9049 Acquired absence of other specified parts of digestive tract: Secondary | ICD-10-CM | POA: Diagnosis not present

## 2024-06-08 DIAGNOSIS — Z923 Personal history of irradiation: Secondary | ICD-10-CM | POA: Diagnosis not present

## 2024-06-08 DIAGNOSIS — N186 End stage renal disease: Secondary | ICD-10-CM | POA: Diagnosis not present

## 2024-06-08 DIAGNOSIS — Z992 Dependence on renal dialysis: Secondary | ICD-10-CM | POA: Diagnosis not present

## 2024-06-08 DIAGNOSIS — C07 Malignant neoplasm of parotid gland: Secondary | ICD-10-CM | POA: Diagnosis not present

## 2024-06-09 DIAGNOSIS — Z992 Dependence on renal dialysis: Secondary | ICD-10-CM | POA: Diagnosis not present

## 2024-06-09 DIAGNOSIS — N186 End stage renal disease: Secondary | ICD-10-CM | POA: Diagnosis not present

## 2024-06-10 DIAGNOSIS — Z992 Dependence on renal dialysis: Secondary | ICD-10-CM | POA: Diagnosis not present

## 2024-06-10 DIAGNOSIS — N186 End stage renal disease: Secondary | ICD-10-CM | POA: Diagnosis not present

## 2024-06-11 DIAGNOSIS — Z992 Dependence on renal dialysis: Secondary | ICD-10-CM | POA: Diagnosis not present

## 2024-06-11 DIAGNOSIS — N186 End stage renal disease: Secondary | ICD-10-CM | POA: Diagnosis not present

## 2024-06-12 DIAGNOSIS — N186 End stage renal disease: Secondary | ICD-10-CM | POA: Diagnosis not present

## 2024-06-12 DIAGNOSIS — Z992 Dependence on renal dialysis: Secondary | ICD-10-CM | POA: Diagnosis not present

## 2024-06-13 DIAGNOSIS — Z992 Dependence on renal dialysis: Secondary | ICD-10-CM | POA: Diagnosis not present

## 2024-06-13 DIAGNOSIS — N186 End stage renal disease: Secondary | ICD-10-CM | POA: Diagnosis not present

## 2024-06-14 DIAGNOSIS — N186 End stage renal disease: Secondary | ICD-10-CM | POA: Diagnosis not present

## 2024-06-14 DIAGNOSIS — Z992 Dependence on renal dialysis: Secondary | ICD-10-CM | POA: Diagnosis not present

## 2024-06-15 DIAGNOSIS — Z992 Dependence on renal dialysis: Secondary | ICD-10-CM | POA: Diagnosis not present

## 2024-06-15 DIAGNOSIS — N186 End stage renal disease: Secondary | ICD-10-CM | POA: Diagnosis not present

## 2024-06-16 ENCOUNTER — Ambulatory Visit (HOSPITAL_COMMUNITY): Admitting: Physical Therapy

## 2024-06-16 DIAGNOSIS — G8929 Other chronic pain: Secondary | ICD-10-CM

## 2024-06-16 DIAGNOSIS — R293 Abnormal posture: Secondary | ICD-10-CM | POA: Diagnosis not present

## 2024-06-16 DIAGNOSIS — N186 End stage renal disease: Secondary | ICD-10-CM | POA: Diagnosis not present

## 2024-06-16 DIAGNOSIS — M546 Pain in thoracic spine: Secondary | ICD-10-CM | POA: Diagnosis not present

## 2024-06-16 DIAGNOSIS — Z992 Dependence on renal dialysis: Secondary | ICD-10-CM | POA: Diagnosis not present

## 2024-06-16 DIAGNOSIS — M542 Cervicalgia: Secondary | ICD-10-CM | POA: Diagnosis not present
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
## 2024-06-17 DIAGNOSIS — N186 End stage renal disease: Secondary | ICD-10-CM | POA: Diagnosis not present

## 2024-06-17 DIAGNOSIS — Z992 Dependence on renal dialysis: Secondary | ICD-10-CM | POA: Diagnosis not present

## 2024-06-18 ENCOUNTER — Encounter (HOSPITAL_COMMUNITY): Payer: Self-pay

## 2024-06-18 ENCOUNTER — Ambulatory Visit (HOSPITAL_COMMUNITY)

## 2024-06-18 DIAGNOSIS — M542 Cervicalgia: Secondary | ICD-10-CM | POA: Diagnosis not present

## 2024-06-18 DIAGNOSIS — R293 Abnormal posture: Secondary | ICD-10-CM | POA: Diagnosis not present

## 2024-06-18 DIAGNOSIS — G8929 Other chronic pain: Secondary | ICD-10-CM | POA: Diagnosis not present

## 2024-06-18 DIAGNOSIS — N186 End stage renal disease: Secondary | ICD-10-CM | POA: Diagnosis not present

## 2024-06-18 DIAGNOSIS — M546 Pain in thoracic spine: Secondary | ICD-10-CM | POA: Diagnosis not present

## 2024-06-18 DIAGNOSIS — Z992 Dependence on renal dialysis: Secondary | ICD-10-CM | POA: Diagnosis not present
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
## 2024-06-19 DIAGNOSIS — N186 End stage renal disease: Secondary | ICD-10-CM | POA: Diagnosis not present

## 2024-06-19 DIAGNOSIS — Z992 Dependence on renal dialysis: Secondary | ICD-10-CM | POA: Diagnosis not present

## 2024-06-20 DIAGNOSIS — N186 End stage renal disease: Secondary | ICD-10-CM | POA: Diagnosis not present

## 2024-06-20 DIAGNOSIS — Z992 Dependence on renal dialysis: Secondary | ICD-10-CM | POA: Diagnosis not present

## 2024-06-21 DIAGNOSIS — Z992 Dependence on renal dialysis: Secondary | ICD-10-CM | POA: Diagnosis not present

## 2024-06-21 DIAGNOSIS — N186 End stage renal disease: Secondary | ICD-10-CM | POA: Diagnosis not present

## 2024-06-22 DIAGNOSIS — N186 End stage renal disease: Secondary | ICD-10-CM | POA: Diagnosis not present

## 2024-06-22 DIAGNOSIS — Z992 Dependence on renal dialysis: Secondary | ICD-10-CM | POA: Diagnosis not present

## 2024-06-23 ENCOUNTER — Ambulatory Visit (HOSPITAL_COMMUNITY)

## 2024-06-23 DIAGNOSIS — M546 Pain in thoracic spine: Secondary | ICD-10-CM | POA: Diagnosis not present

## 2024-06-23 DIAGNOSIS — N186 End stage renal disease: Secondary | ICD-10-CM | POA: Diagnosis not present

## 2024-06-23 DIAGNOSIS — R293 Abnormal posture: Secondary | ICD-10-CM | POA: Diagnosis not present

## 2024-06-23 DIAGNOSIS — M542 Cervicalgia: Secondary | ICD-10-CM

## 2024-06-23 DIAGNOSIS — G8929 Other chronic pain: Secondary | ICD-10-CM | POA: Diagnosis not present

## 2024-06-23 DIAGNOSIS — Z992 Dependence on renal dialysis: Secondary | ICD-10-CM | POA: Diagnosis not present
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
## 2024-06-24 DIAGNOSIS — N186 End stage renal disease: Secondary | ICD-10-CM | POA: Diagnosis not present

## 2024-06-24 DIAGNOSIS — Z992 Dependence on renal dialysis: Secondary | ICD-10-CM | POA: Diagnosis not present

## 2024-06-25 ENCOUNTER — Ambulatory Visit (HOSPITAL_COMMUNITY): Admitting: Physical Therapy

## 2024-06-25 DIAGNOSIS — R293 Abnormal posture: Secondary | ICD-10-CM | POA: Diagnosis not present

## 2024-06-25 DIAGNOSIS — M542 Cervicalgia: Secondary | ICD-10-CM | POA: Diagnosis not present

## 2024-06-25 DIAGNOSIS — M546 Pain in thoracic spine: Secondary | ICD-10-CM | POA: Diagnosis not present

## 2024-06-25 DIAGNOSIS — N186 End stage renal disease: Secondary | ICD-10-CM | POA: Diagnosis not present

## 2024-06-25 DIAGNOSIS — G8929 Other chronic pain: Secondary | ICD-10-CM | POA: Diagnosis not present

## 2024-06-25 DIAGNOSIS — Z992 Dependence on renal dialysis: Secondary | ICD-10-CM | POA: Diagnosis not present
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
## 2024-06-26 ENCOUNTER — Encounter (HOSPITAL_COMMUNITY)

## 2024-06-26 DIAGNOSIS — Z992 Dependence on renal dialysis: Secondary | ICD-10-CM | POA: Diagnosis not present

## 2024-06-26 DIAGNOSIS — N186 End stage renal disease: Secondary | ICD-10-CM | POA: Diagnosis not present

## 2024-06-27 DIAGNOSIS — Z992 Dependence on renal dialysis: Secondary | ICD-10-CM | POA: Diagnosis not present

## 2024-06-27 DIAGNOSIS — N186 End stage renal disease: Secondary | ICD-10-CM | POA: Diagnosis not present

## 2024-06-28 DIAGNOSIS — Z992 Dependence on renal dialysis: Secondary | ICD-10-CM | POA: Diagnosis not present

## 2024-06-28 DIAGNOSIS — N186 End stage renal disease: Secondary | ICD-10-CM | POA: Diagnosis not present

## 2024-06-29 DIAGNOSIS — Z992 Dependence on renal dialysis: Secondary | ICD-10-CM | POA: Diagnosis not present

## 2024-06-29 DIAGNOSIS — N186 End stage renal disease: Secondary | ICD-10-CM | POA: Diagnosis not present

## 2024-06-30 ENCOUNTER — Ambulatory Visit (HOSPITAL_COMMUNITY)

## 2024-06-30 ENCOUNTER — Encounter (HOSPITAL_COMMUNITY): Payer: Self-pay

## 2024-06-30 DIAGNOSIS — G8929 Other chronic pain: Secondary | ICD-10-CM | POA: Diagnosis not present

## 2024-06-30 DIAGNOSIS — M542 Cervicalgia: Secondary | ICD-10-CM | POA: Diagnosis not present

## 2024-06-30 DIAGNOSIS — R293 Abnormal posture: Secondary | ICD-10-CM

## 2024-06-30 DIAGNOSIS — Z992 Dependence on renal dialysis: Secondary | ICD-10-CM | POA: Diagnosis not present

## 2024-06-30 DIAGNOSIS — M546 Pain in thoracic spine: Secondary | ICD-10-CM | POA: Diagnosis not present

## 2024-06-30 DIAGNOSIS — N186 End stage renal disease: Secondary | ICD-10-CM | POA: Diagnosis not present
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
## 2024-07-07 ENCOUNTER — Encounter (HOSPITAL_COMMUNITY): Payer: Self-pay

## 2024-07-07 ENCOUNTER — Ambulatory Visit (HOSPITAL_COMMUNITY): Attending: Nephrology

## 2024-07-07 DIAGNOSIS — C07 Malignant neoplasm of parotid gland: Secondary | ICD-10-CM | POA: Insufficient documentation

## 2024-07-07 DIAGNOSIS — M546 Pain in thoracic spine: Secondary | ICD-10-CM | POA: Insufficient documentation

## 2024-07-07 DIAGNOSIS — G8929 Other chronic pain: Secondary | ICD-10-CM | POA: Diagnosis present

## 2024-07-07 DIAGNOSIS — R293 Abnormal posture: Secondary | ICD-10-CM | POA: Insufficient documentation

## 2024-07-07 DIAGNOSIS — M542 Cervicalgia: Secondary | ICD-10-CM | POA: Insufficient documentation
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
## 2024-07-09 DIAGNOSIS — Z992 Dependence on renal dialysis: Secondary | ICD-10-CM | POA: Diagnosis not present

## 2024-07-09 DIAGNOSIS — Z131 Encounter for screening for diabetes mellitus: Secondary | ICD-10-CM | POA: Diagnosis not present

## 2024-07-14 ENCOUNTER — Ambulatory Visit (HOSPITAL_COMMUNITY): Admitting: Physical Therapy

## 2024-07-14 DIAGNOSIS — M542 Cervicalgia: Secondary | ICD-10-CM

## 2024-07-14 DIAGNOSIS — C07 Malignant neoplasm of parotid gland: Secondary | ICD-10-CM

## 2024-07-14 DIAGNOSIS — G8929 Other chronic pain: Secondary | ICD-10-CM

## 2024-07-14 DIAGNOSIS — R293 Abnormal posture: Secondary | ICD-10-CM

## 2024-07-14 DIAGNOSIS — M546 Pain in thoracic spine: Secondary | ICD-10-CM | POA: Diagnosis not present
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
## 2024-07-16 ENCOUNTER — Inpatient Hospital Stay: Attending: Adult Health | Admitting: Adult Health

## 2024-07-16 ENCOUNTER — Encounter: Payer: Self-pay | Admitting: Adult Health

## 2024-07-16 VITALS — BP 138/55 | HR 65 | Temp 98.2°F | Resp 16 | Wt 132.8 lb

## 2024-07-16 DIAGNOSIS — I889 Nonspecific lymphadenitis, unspecified: Secondary | ICD-10-CM | POA: Insufficient documentation

## 2024-07-16 DIAGNOSIS — Z79899 Other long term (current) drug therapy: Secondary | ICD-10-CM | POA: Insufficient documentation

## 2024-07-16 DIAGNOSIS — C07 Malignant neoplasm of parotid gland: Secondary | ICD-10-CM | POA: Insufficient documentation

## 2024-07-16 MED ORDER — AMOXICILLIN 875 MG PO TABS: 875.0000 mg | ORAL_TABLET | Freq: Two times a day (BID) | ORAL | 0 refills | Status: AC

## 2024-07-16 NOTE — Progress Notes (Signed)
 CLINIC:  Survivorship  REASON FOR VISIT:  Routine follow-up for history of head & neck cancer.  BRIEF ONCOLOGIC HISTORY:  Oncology History  Malignant neoplasm of parotid gland (HCC)  07/05/2023 Initial Diagnosis   Malignant neoplasm of parotid gland (HCC)   07/05/2023 Surgery   Radical Right Parotidectomy: Squamous cell carcinoma; 24 lymph nodes negative for carcinoma; margins negative for carcinoma   08/21/2023 - 09/19/2023 Radiation Therapy   Plan Name: HN_R_Parotid Site: Parotid, Right Technique: IMRT Mode: Photon Dose Per Fraction: 2.5 Gy Prescribed Dose (Delivered / Prescribed): 50 Gy / 50 Gy Prescribed Fxs (Delivered / Prescribed): 20 / 20      INTERVAL HISTORY:  Discussed the use of AI scribe software for clinical note transcription with the patient, who gave verbal consent to proceed.  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  He notes a recent onset of swelling and decreased hearing in the right ear.   Swelling near the right ear has been present for three to four weeks, with no swelling noted during a visit four to five weeks ago. There is a significant decrease in hearing in the right ear, described as almost complete hearing loss. Hearing aids have been ineffective due to poor fit.    He has experienced significant weight loss from approximately 160 pounds to 131 pounds following surgery and treatment from his head and neck cancer and has been unable to regain weight. Despite the ability to eat solid foods, there is a lack of appetite. Significant fatigue is present, limiting activity levels.  This is all at baseline from treatment completion.   No fevers or chills are reported.    -Pain: preauricular tenderness -Nutrition/Diet: regular -Dysphagia?: no -Last TSH: 03/2024 with Dr. Duanne   -Last ENT visit:  06/2024 -Last Rad Onc visit: 12/2023 -Last  Dentist visit: 1 year approximate    ADDITIONAL REVIEW OF SYSTEMS:  Review of Systems  Constitutional:  Negative for appetite change, chills, fatigue, fever and unexpected weight change.  HENT:   Negative for hearing loss, lump/mass and trouble swallowing.   Eyes:  Negative for eye problems and icterus.  Respiratory:  Negative for chest tightness, cough and shortness of breath.   Cardiovascular:  Negative for chest pain, leg swelling and palpitations.  Gastrointestinal:  Negative for abdominal distention, abdominal pain, constipation, diarrhea, nausea and vomiting.  Endocrine: Negative for hot flashes.  Genitourinary:  Negative for difficulty urinating.   Musculoskeletal:  Negative for arthralgias.  Skin:  Negative for itching and rash.  Neurological:  Negative for dizziness, extremity weakness, headaches and numbness.  Hematological:  Negative for adenopathy. Does not bruise/bleed easily.  Psychiatric/Behavioral:  Negative for depression. The patient is not nervous/anxious.        CURRENT MEDICATIONS:  Current Outpatient Medications on File Prior to Visit  Medication Sig Dispense Refill   amLODipine  (NORVASC ) 5 MG tablet TAKE ONE TABLET (5MG  TOTAL) BY MOUTH DAILY 90 tablet 1   aspirin  EC 81 MG tablet Take 81 mg by mouth daily.     atorvastatin  (LIPITOR) 40 MG tablet Take 1 tablet (40 mg total) by mouth daily. 90 tablet 3   Cholecalciferol (VITAMIN D3) 25 MCG (1000 UT) CAPS Take 1 capsule (1,000 Units total) by mouth daily at 6 (six) AM. 60 capsule 6   doxazosin  (CARDURA ) 8 MG tablet Take 8 mg by mouth daily.  ferrous sulfate 325 (65 FE) MG tablet Take 325 mg by mouth daily with breakfast.     hydrALAZINE  (APRESOLINE ) 50 MG tablet TAKE ONE AND ONE-HALF TABLETS (75MG  TOTAL) BY MOUTH THREE TIMES DAILY 270 tablet 1   ketorolac  (ACULAR ) 0.5 % ophthalmic solution Place 1 drop into both eyes in the morning, at noon, and at bedtime.     levothyroxine  (SYNTHROID ) 50 MCG tablet TAKE ONE  TABLET ( TOTAL) BY MOUTH DAILY BEFORE BREAKFAST 90 tablet 1   LORazepam  (ATIVAN ) 0.5 MG tablet Take 1 tablet (0.5 mg total) by mouth 2 (two) times daily as needed (tremors). 60 tablet 0   multivitamin (RENA-VIT) TABS tablet Take 1 tablet by mouth daily.     pantoprazole  (PROTONIX ) 40 MG tablet TAKE ONE TABLET BY MOUTH TWICE A DAY 180 tablet 0   sacubitril -valsartan  (ENTRESTO ) 97-103 MG TAKE ONE TABLET BY MOUTH TWICE A DAY 60 tablet 3   spironolactone  (ALDACTONE ) 25 MG tablet TAKE 0.5 TABLETS (12.5 MG TOTAL) BY MOUTH DAILY 15 tablet 6   torsemide  (DEMADEX ) 20 MG tablet Take 20 mg by mouth daily.     No current facility-administered medications on file prior to visit.    ALLERGIES:  No Known Allergies   PHYSICAL EXAM:  Vitals:   07/16/24 1110  BP: (!) 138/55  Pulse: 65  Resp: 16  Temp: 98.2 F (36.8 C)  SpO2: 99%   Filed Weights   07/16/24 1110  Weight: 132 lb 12.8 oz (60.2 kg)     General: Well-nourished, well-appearing male in no acute distress.  Accompanied/Unaccompanied today.  HEENT: Head is atraumatic and normocephalic.  Pupils equal and reactive to light. Conjunctivae clear without exudate.  Sclerae anicteric. Oral mucosa is pink and moist without lesions.  Tongue pink, moist, and midline. Oropharynx is pink and moist, without lesions. + right pre auricular lymphadenopathy and + TTP Lymph: No preauricular, postauricular, cervical, supraclavicular, or infraclavicular lymphadenopathy noted on palpation.   Neck: No palpable masses.  Cardiovascular: Normal rate and rhythm. Respiratory: Clear to auscultation bilaterally. Chest expansion symmetric without accessory muscle use; breathing non-labored.  GI: Abdomen soft and round. Non-tender, non-distended. Bowel sounds normoactive.  GU: Deferred.   Neuro: No focal deficits. Steady gait.   Psych: Normal mood and affect for situation. Extremities: No edema.  Skin: Warm and dry.    LABORATORY DATA:  None at this  visit.  DIAGNOSTIC IMAGING:  None at this visit.    ASSESSMENT & PLAN:  Mr. Bradley Black is a pleasant 78 y.o. 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  Patient presents to survivorship clinic today for routine follow-up after finishing treatment.   1. 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 Dugger is clinically without evidence of disease or recurrence on physical exam today.     2. Preauricular lymphadenitis.  Timing correlates to new use of hearing aids in that ear.  Will treat with antibiotics and see back on 07/30/2024 to reassess whether CT scan should be moved to earlier than 12/2024 as planned.    3. Nutritional status: Mr. Koloski reports that he is currently able to consume adequate nutrition by mouth.  Weight is stable.  Encouraged to continue to consume adequate hydration and nutrition, as tolerated.    4.  At risk for hypothyroidism: The thyroid  gland is often affected after treatment for head & neck cancer.  Mr. Plucinski most  recent TSH was normal with his PCP, he was recommended to continue annual TSH checks.     5. Health maintenance and wellness promotion: Cancer patients who consume a diet rich in fruits and vegetables have better overall health and decreased risk of cancer recurrence. Mr. Alarie was encouraged to consume 5-7 servings of fruits and vegetables per day, as tolerated. Mr. Guzzo was also encouraged to engage in moderate to vigorous exercise for 30 minutes per day most days of the week.   6. Support services/counseling: It is not uncommon for this period of the patient's cancer care trajectory to be one of many emotions and stressors.  We discussed an opportunity for him to participate in the next session of Head & Neck FYNN (Finding Your New Normal) support group series, designed for patients after they have completed treatment.   Mr.  Farabee was encouraged to take advantage of our many other support services programs, support groups, and/or counseling in coping with his new life as a cancer survivor after completing anti-cancer treatment. The patient was offered support today through active listening and expressive supportive counseling.     Dispo:  -Return in 2 weeks for reassessment of the area of concern     A total of 45 minutes was spent in the face-to-face care of this patient, with greater than 50% of that time spent in counseling and care-coordination.    Morna Pickle, NP Survivorship Program [facility] [phone]

## 2024-07-21 ENCOUNTER — Encounter (HOSPITAL_COMMUNITY)

## 2024-07-22 ENCOUNTER — Other Ambulatory Visit: Payer: Self-pay

## 2024-07-22 DIAGNOSIS — C07 Malignant neoplasm of parotid gland: Secondary | ICD-10-CM

## 2024-07-30 ENCOUNTER — Encounter: Payer: Self-pay | Admitting: Adult Health

## 2024-07-30 ENCOUNTER — Inpatient Hospital Stay (HOSPITAL_BASED_OUTPATIENT_CLINIC_OR_DEPARTMENT_OTHER): Admitting: Adult Health

## 2024-07-30 DIAGNOSIS — C07 Malignant neoplasm of parotid gland: Secondary | ICD-10-CM

## 2024-07-30 NOTE — Progress Notes (Signed)
 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  He had right jaw swelling at his last appointment and CT neck was ordered and scheduled to occur tomorrow and f/u with Dr. Luciano was scheduled for next week.  He had been using new hearing aid in his right ear and had some preauricular tenderness and was prescribed Augmentin.  He notes the swelling is still present, but has not worsened.    O: Sounds well, in no apparent Distress.  Mood and behavior are normal.  Speech is normal.  AP: Right jaw swelling.  Will repeat CT scan tomorrow, and he will f/u with Dr. Luciano next week.  No questions at this time    The patient was provided an opportunity to ask questions and all were answered. The patient agreed with the plan and demonstrated an understanding of the instructions.   The patient was advised to call back or seek an in-person evaluation if the symptoms worsen or if the condition fails to improve as anticipated.   I provided less than 5 minutes of non face-to-face telephone visit time billed as no charge visit.    Morna Kendall, NP 07/30/24 1:52 PM Medical Oncology and Hematology [facility] [address] [place], [place] [address] Tel. [phone]    Fax. [phone]

## 2024-07-31 ENCOUNTER — Ambulatory Visit (HOSPITAL_COMMUNITY)
Admission: RE | Admit: 2024-07-31 | Discharge: 2024-07-31 | Disposition: A | Discharge status: Home / Self Care | Source: Ambulatory Visit | Attending: Otolaryngology | Admitting: Otolaryngology

## 2024-07-31 ENCOUNTER — Encounter (HOSPITAL_COMMUNITY): Payer: Self-pay

## 2024-07-31 DIAGNOSIS — C07 Malignant neoplasm of parotid gland: Secondary | ICD-10-CM | POA: Diagnosis present

## 2024-07-31 MED ORDER — SODIUM CHLORIDE (PF) 0.9 % IJ SOLN
INTRAMUSCULAR | Status: AC
  Filled 2024-07-31: qty 50

## 2024-07-31 MED ORDER — IOHEXOL 300 MG/ML  SOLN
75.0000 mL | Freq: Once | INTRAMUSCULAR | Status: AC | PRN
  Administered 2024-07-31: 75 mL via INTRAVENOUS

## 2024-08-03 ENCOUNTER — Encounter: Payer: Self-pay | Admitting: Radiology

## 2024-08-12 ENCOUNTER — Other Ambulatory Visit: Payer: Self-pay | Admitting: Adult Health

## 2024-08-12 DIAGNOSIS — C07 Malignant neoplasm of parotid gland: Secondary | ICD-10-CM

## 2024-08-18 ENCOUNTER — Other Ambulatory Visit: Payer: Self-pay | Admitting: Family Medicine

## 2024-08-20 NOTE — Telephone Encounter (Signed)
 Requested Prescriptions  Pending Prescriptions Disp Refills   pantoprazole  (PROTONIX ) 40 MG tablet [Pharmacy Med Name: PANTOPRAZOLE  SODIUM 40 MG DR TAB] 180 tablet 0    Sig: TAKE ONE TABLET BY MOUTH TWICE A DAY     Gastroenterology: Proton Pump Inhibitors Passed - 08/20/2024  4:32 PM      Passed - Valid encounter within last 12 months    Recent Outpatient Visits           4 months ago ESRD (end stage renal disease) on dialysis [facility])   [place] [facility] Pickard, Butler DASEN, MD   9 months ago Tremor   [place] [facility] Duanne Butler DASEN, MD   10 months ago Herpes zoster with other complication   [place] [facility] Duanne Butler DASEN, MD   1 year ago Mass of right parotid gland   [place] [facility] Duanne Butler DASEN, MD   1 year ago Parotid mass   [place] [facility] Kayla Jeoffrey RAMAN, FNP

## 2024-09-01 ENCOUNTER — Other Ambulatory Visit: Payer: Self-pay | Admitting: Cardiology

## 2024-09-14 ENCOUNTER — Telehealth: Payer: Self-pay | Admitting: Cardiology

## 2024-09-14 NOTE — Telephone Encounter (Signed)
°*  STAT* If patient is at the pharmacy, call can be transferred to refill team.   1. Which medications need to be refilled? (please list name of each medication and dose if known) spironolactone  (ALDACTONE ) 25 MG tablet and hydrALAZINE  (APRESOLINE ) 50 MG tablet    2. Would you like to learn more about the convenience, safety, & potential cost savings by using the [facility]? NO   3. Are you open to using the [facility] NO   4. Which pharmacy/location (including street and city if local pharmacy) is medication to be sent to?  [facility] - [place],  - [address]     5. Do they need a 30 day or 90 day supply? 90  Patient is out of medication

## 2024-09-15 MED ORDER — SPIRONOLACTONE 25 MG PO TABS: 12.5000 mg | ORAL_TABLET | Freq: Every day | ORAL | 0 refills | Status: AC

## 2024-09-15 NOTE — Telephone Encounter (Signed)
 Hydralazine  was sent 09/04/24.  Refill for Spironolactone  will be sent in now.

## 2024-09-18 ENCOUNTER — Ambulatory Visit: Attending: Nurse Practitioner | Admitting: Nurse Practitioner

## 2024-09-18 ENCOUNTER — Encounter: Payer: Self-pay | Admitting: Nurse Practitioner

## 2024-09-18 ENCOUNTER — Encounter: Payer: Self-pay | Admitting: *Deleted

## 2024-09-18 VITALS — BP 128/62 | HR 58 | Ht 68.0 in | Wt 133.2 lb

## 2024-09-18 DIAGNOSIS — I6523 Occlusion and stenosis of bilateral carotid arteries: Secondary | ICD-10-CM | POA: Diagnosis not present

## 2024-09-18 DIAGNOSIS — Z992 Dependence on renal dialysis: Secondary | ICD-10-CM | POA: Diagnosis not present

## 2024-09-18 DIAGNOSIS — I2510 Atherosclerotic heart disease of native coronary artery without angina pectoris: Secondary | ICD-10-CM | POA: Diagnosis not present

## 2024-09-18 DIAGNOSIS — N186 End stage renal disease: Secondary | ICD-10-CM

## 2024-09-18 DIAGNOSIS — E785 Hyperlipidemia, unspecified: Secondary | ICD-10-CM

## 2024-09-18 DIAGNOSIS — Z79899 Other long term (current) drug therapy: Secondary | ICD-10-CM | POA: Diagnosis not present

## 2024-09-18 DIAGNOSIS — I5022 Chronic systolic (congestive) heart failure: Secondary | ICD-10-CM

## 2024-09-18 MED ORDER — ATORVASTATIN CALCIUM 40 MG PO TABS: 40.0000 mg | ORAL_TABLET | Freq: Every day | ORAL | 3 refills | Status: AC

## 2024-09-18 NOTE — Progress Notes (Unsigned)
 " Cardiology Office Note   Date:  09/18/2024 ID:  Vikrant Riddick Nuon, DOB 10-Aug-1946, MRN [id number] PCP: Duanne Butler DASEN, MD  [facility] Providers Cardiologist:  Alvan Carrier, MD Cardiology APP:  Emelia Josefa HERO, NP  Electrophysiologist:  Lynwood Rakers, MD (Inactive)     History of Present Illness Bradley Black is a 78 y.o. male with a PMH of CAD, s/p CABG in 2013 and hx of postoperative NSTEMI, HLD, chronic CHF, PVCs, dizziness, carotid artery stenosis, ESRD on peritoneal dialysis, and parotid malignant neoplasm, s/p surgery/parotid gland resection in 2024, who presents today for overdue 57-month follow-up appointment.  Last seen by Dr. Alvan on November 27, 2023.  At that time, patient noted dizziness and weakness with ongoing symptoms and was severely orthostatic at prior office visit with SBP dropping 30 points with standing.  Imdur  was stopped and was instructed/encouraged to work on hydration.    Today he is here for follow-up.  Denies any acute current complaints or issues.  Tells me he is no longer on Entresto  due to his kidney function.  He is compliant doing peritoneal dialysis every night.  He gets regular blood work done with [facility] dialysis. Denies any chest pain, shortness of breath, palpitations, syncope, presyncope, dizziness, orthopnea, PND, swelling or significant weight changes, acute bleeding, or claudication.  ROS: Negative. See HPI.   SH: Daughter works at [employer] in [place] that helps women overcome addiction.   Studies Reviewed  EKG: EKG is not ordered today.   Carotid duplex 11/2023:  Summary:  Right Carotid: Velocities in the right ICA are consistent with a 40-59%                 stenosis. Non-hemodynamically significant plaque <50% noted  in                the CCA. The ECA appears <50% stenosed.   Left Carotid: Velocities in the left ICA are consistent with a 60-79%  stenosis.               Non-hemodynamically significant plaque <50%  noted in the  CCA. The                ECA appears >50% stenosed.   Vertebrals:  Bilateral vertebral arteries demonstrate antegrade flow.  Subclavians: Normal flow hemodynamics were seen in bilateral subclavian               arteries.   *See table(s) above for measurements and observations.  Suggest follow up study in 6 months.  LHC 07/2023:    Prox RCA to Mid RCA lesion is 100% stenosed.   Prox Graft lesion is 99% stenosed.   Mid LAD lesion is 100% stenosed.   Origin to Prox Graft lesion is 100% stenosed.   Ost Cx to Mid Cx lesion is 99% stenosed.   2nd Mrg lesion is 100% stenosed.   A drug-eluting stent was successfully placed using a SYNERGY XD [id number].   Post intervention, there is a 0% residual stenosis.   SVG graft was visualized by angiography.   LIMA graft was visualized by angiography and is normal in caliber.   SVG graft was visualized by angiography.   SVG graft was visualized by angiography and is normal in caliber.   Severe three vessel CAD s/p 4V CABG with 3/4 patent bypass grafts Chronic occlusion mid LAD. The mid and distal LAD fills from the patent LIMA graft. SVG to Diagonal is occluded Severe proximal Circumflex stenosis. Patent  vein graft to the large obtuse marginal branch.  Large dominant RCA with chronic proximal occlusion within the old stent. Patent vein graft to the PDA. Severe stenosis proximal body of the vein graft Successful PTCA/DES x 1 proximal body of the vein graft to the PDA Elevated LV filling pressure   Recommendations: ASA and Plavix  for at least six months. Same day post PCI discharge.   Echo 07/2023:  1. Septal , apical , inferior wall hypokinesis . Left ventricular  ejection fraction, by estimation, is 40 to 45%. The left ventricle has  mildly decreased function. The left ventricle has no regional wall motion  abnormalities. The left ventricular  internal cavity size was mildly dilated. There is mild left ventricular  hypertrophy. Left  ventricular diastolic parameters were normal.   2. Right ventricular systolic function is normal. The right ventricular  size is normal. There is mildly elevated pulmonary artery systolic  pressure.   3. Left atrial size was severely dilated.   4. Right atrial size was mildly dilated.   5. The mitral valve is abnormal. Mild to moderate mitral valve  regurgitation. No evidence of mitral stenosis.   6. Tricuspid valve regurgitation is mild to moderate.   7. The aortic valve is tricuspid. Aortic valve regurgitation is not  visualized. No aortic stenosis is present.   8. The inferior vena cava is normal in size with greater than 50%  respiratory variability, suggesting right atrial pressure of 3 mmHg.  Cardiac monitor 08/2020:  14 day event monitor Min HR 43, Max HR 154, Avg HR 65 No symptoms reported Rare supraventricular ectopy in the form of isolated PACs, couplets, triplets. Short runs of SVT up to 7 beats Frequent ventricular ectopy in the form of isolated PVCs (18.1%), couplets, triplets. Short runs of NSVT up to 6 beats  Physical Exam VS:  BP 128/62   Pulse (!) 58   Ht 5' 8 (1.727 m)   Wt 133 lb 3.2 oz (60.4 kg)   SpO2 99%   BMI 20.25 kg/m        Wt Readings from Last 3 Encounters:  09/18/24 133 lb 3.2 oz (60.4 kg)  07/16/24 132 lb 12.8 oz (60.2 kg)  04/13/24 136 lb (61.7 kg)    GEN: Well nourished, well developed in no acute distress NECK: No JVD; No carotid bruits CARDIAC: S1/S2, RRR, no murmurs, rubs, gallops RESPIRATORY:  Clear to auscultation without rales, wheezing or rhonchi  ABDOMEN: Soft, non-tender, non-distended EXTREMITIES:  No edema; No deformity   ASSESSMENT AND PLAN  CAD, s/p CABG, HLD, medication management Stable with no anginal symptoms. No indication for ischemic evaluation.  Continue aspirin  and Lipitor - will provide refill of Lipitor.  GDMT limited due to past history of orthostatic symptoms.  Heart healthy diet and regular cardiovascular  exercise encouraged. Continue to follow with PCP. Will obtain FLP/LFT.   Chronic systolic CHF Stage C, NYHA class I-II symptoms. EF 40-45% in October 2024. Euvolemic and well compensated on exam.  GDMT limited due to past orthostatic symptoms in the past-see HPI.  No medication changes at this time.  Will request most recent labs from [facility]. Low sodium diet, fluid restriction <2L, and daily weights encouraged. Educated to contact our office for weight gain of 2 lbs overnight or 5 lbs in one week.  Carotid artery stenosis Carotid duplex from March 2025 showed 40 to 59% stenosis along right ICA, 60 to 79% stenosis along the left ICA.  Continue current medication regimen.  Plan  to update study in 1 year or sooner if any changes.  ESRD  Continue with PD.  Will request most recent labs as noted above.  Dispo: Follow-up with MD/APP in 6 months or sooner if any changes.  Signed, Almarie Crate, NP   "

## 2024-09-18 NOTE — Patient Instructions (Signed)
 Medication Instructions:   Lipitor refilled today  Continue all other medications.     Labwork:  FLP, LFT - orders given today Please do in 1-2 weeks Reminder:  Nothing to eat or drink after 12 midnight prior to labs. Office will contact with results via phone, letter or mychart.     Testing/Procedures:  none  Follow-Up:  6 months   Any Other Special Instructions Will Be Listed Below (If Applicable).   If you need a refill on your cardiac medications before your next appointment, please call your pharmacy.

## 2024-09-21 ENCOUNTER — Encounter: Payer: Self-pay | Admitting: Nurse Practitioner

## 2024-10-01 LAB — HEPATIC FUNCTION PANEL
ALT: 16 IU/L (ref 0–44)
AST: 16 IU/L (ref 0–40)
Albumin: 3.9 g/dL (ref 3.8–4.8)
Alkaline Phosphatase: 120 IU/L (ref 47–123)
Bilirubin Total: 1 mg/dL (ref 0.0–1.2)
Bilirubin, Direct: 0.29 mg/dL (ref 0.00–0.40)
Total Protein: 6.2 g/dL (ref 6.0–8.5)

## 2024-10-01 LAB — LIPID PANEL
Chol/HDL Ratio: 2.7 ratio (ref 0.0–5.0)
Cholesterol, Total: 114 mg/dL (ref 100–199)
HDL: 43 mg/dL
LDL Chol Calc (NIH): 53 mg/dL (ref 0–99)
Triglycerides: 94 mg/dL (ref 0–149)
VLDL Cholesterol Cal: 18 mg/dL (ref 5–40)

## 2024-10-07 ENCOUNTER — Other Ambulatory Visit: Payer: Self-pay | Admitting: Family Medicine

## 2024-10-07 DIAGNOSIS — I10 Essential (primary) hypertension: Secondary | ICD-10-CM

## 2024-10-10 ENCOUNTER — Ambulatory Visit: Payer: Self-pay | Admitting: Nurse Practitioner

## 2024-10-20 ENCOUNTER — Telehealth: Payer: Self-pay

## 2024-10-20 NOTE — Telephone Encounter (Signed)
 Copied from CRM #[id number]. Topic: Referral - Request for Referral >> Oct 20, 2024  1:49 PM Ivette P wrote: Did the patient discuss referral with their provider in the last year? Yes (If No - schedule appointment) (If Yes - send message)  Appointment offered? Yes  Type of order/referral and detailed reason for visit:   Branch, Dorn FALCON, MD Cardiology  Luciano Standing, MD Otolaryngology   Preference of office, provider, location:  Alvan Dorn FALCON, MD Cardiology  Luciano Standing, MD Otolaryngology  If referral order, have you been seen by this specialty before? Yes (If Yes, this issue or another issue? When? Where?  Can we respond through MyChart? Yes   Pt received a letter stating that he has to get a referral from primary every time he wants to see specialist

## 2024-10-22 ENCOUNTER — Other Ambulatory Visit: Payer: Self-pay

## 2024-10-22 DIAGNOSIS — K118 Other diseases of salivary glands: Secondary | ICD-10-CM

## 2024-10-22 DIAGNOSIS — I5022 Chronic systolic (congestive) heart failure: Secondary | ICD-10-CM

## 2024-10-22 DIAGNOSIS — I10 Essential (primary) hypertension: Secondary | ICD-10-CM

## 2024-12-17 ENCOUNTER — Encounter

## 2025-03-19 ENCOUNTER — Ambulatory Visit: Admitting: Nurse Practitioner
